# Patient Record
Sex: Female | Born: 1955
Health system: Southern US, Community
[De-identification: ages and names within clinical notes are randomized; demographics above are authoritative.]

## PROBLEM LIST (undated history)

## (undated) ENCOUNTER — Emergency Department (HOSPITAL_COMMUNITY): Payer: Medicare Other

## (undated) DIAGNOSIS — R519 Headache, unspecified: Secondary | ICD-10-CM

## (undated) DIAGNOSIS — K529 Noninfective gastroenteritis and colitis, unspecified: Secondary | ICD-10-CM

## (undated) DIAGNOSIS — M1990 Unspecified osteoarthritis, unspecified site: Secondary | ICD-10-CM

## (undated) DIAGNOSIS — I8290 Acute embolism and thrombosis of unspecified vein: Secondary | ICD-10-CM

## (undated) DIAGNOSIS — F329 Major depressive disorder, single episode, unspecified: Secondary | ICD-10-CM

## (undated) DIAGNOSIS — R011 Cardiac murmur, unspecified: Secondary | ICD-10-CM

## (undated) DIAGNOSIS — Z5189 Encounter for other specified aftercare: Secondary | ICD-10-CM

## (undated) DIAGNOSIS — K766 Portal hypertension: Secondary | ICD-10-CM

## (undated) DIAGNOSIS — I8500 Esophageal varices without bleeding: Secondary | ICD-10-CM

## (undated) DIAGNOSIS — I739 Peripheral vascular disease, unspecified: Secondary | ICD-10-CM

## (undated) DIAGNOSIS — Z87442 Personal history of urinary calculi: Secondary | ICD-10-CM

## (undated) DIAGNOSIS — G2581 Restless legs syndrome: Secondary | ICD-10-CM

## (undated) DIAGNOSIS — F32A Depression, unspecified: Secondary | ICD-10-CM

## (undated) DIAGNOSIS — I10 Essential (primary) hypertension: Secondary | ICD-10-CM

## (undated) DIAGNOSIS — C50919 Malignant neoplasm of unspecified site of unspecified female breast: Secondary | ICD-10-CM

## (undated) DIAGNOSIS — IMO0001 Reserved for inherently not codable concepts without codable children: Secondary | ICD-10-CM

## (undated) DIAGNOSIS — K7460 Unspecified cirrhosis of liver: Secondary | ICD-10-CM

## (undated) DIAGNOSIS — K920 Hematemesis: Secondary | ICD-10-CM

## (undated) DIAGNOSIS — IMO0002 Reserved for concepts with insufficient information to code with codable children: Secondary | ICD-10-CM

## (undated) DIAGNOSIS — D649 Anemia, unspecified: Secondary | ICD-10-CM

## (undated) DIAGNOSIS — R51 Headache: Secondary | ICD-10-CM

## (undated) HISTORY — DX: Noninfective gastroenteritis and colitis, unspecified: K52.9

## (undated) HISTORY — PX: DILATION AND CURETTAGE OF UTERUS: SHX78

## (undated) HISTORY — PX: PORT-A-CATH REMOVAL: SHX5289

## (undated) HISTORY — DX: Reserved for inherently not codable concepts without codable children: IMO0001

## (undated) HISTORY — DX: Reserved for concepts with insufficient information to code with codable children: IMO0002

---

## 2003-01-19 ENCOUNTER — Encounter: Payer: Self-pay | Admitting: Internal Medicine

## 2003-01-19 ENCOUNTER — Encounter: Admission: RE | Admit: 2003-01-19 | Discharge: 2003-01-19 | Payer: Self-pay | Admitting: Internal Medicine

## 2003-08-17 ENCOUNTER — Ambulatory Visit (HOSPITAL_COMMUNITY): Admission: RE | Admit: 2003-08-17 | Discharge: 2003-08-17 | Payer: Self-pay

## 2003-08-17 ENCOUNTER — Encounter (INDEPENDENT_AMBULATORY_CARE_PROVIDER_SITE_OTHER): Payer: Self-pay

## 2009-12-30 HISTORY — PX: PORTACATH PLACEMENT: SHX2246

## 2010-07-17 ENCOUNTER — Encounter: Admission: RE | Admit: 2010-07-17 | Discharge: 2010-07-17 | Payer: Self-pay | Admitting: Family Medicine

## 2010-07-18 ENCOUNTER — Ambulatory Visit: Payer: Self-pay | Admitting: Oncology

## 2010-07-22 ENCOUNTER — Encounter: Admission: RE | Admit: 2010-07-22 | Discharge: 2010-07-22 | Payer: Self-pay | Admitting: Family Medicine

## 2010-07-25 LAB — CBC WITH DIFFERENTIAL/PLATELET
Basophils Absolute: 0 10*3/uL (ref 0.0–0.1)
EOS%: 0.5 % (ref 0.0–7.0)
HCT: 45.3 % (ref 34.8–46.6)
HGB: 15.6 g/dL (ref 11.6–15.9)
MCH: 30.3 pg (ref 25.1–34.0)
NEUT%: 73.3 % (ref 38.4–76.8)
lymph#: 1.5 10*3/uL (ref 0.9–3.3)

## 2010-07-25 LAB — COMPREHENSIVE METABOLIC PANEL
AST: 18 U/L (ref 0–37)
BUN: 10 mg/dL (ref 6–23)
CO2: 25 mEq/L (ref 19–32)
Calcium: 9.3 mg/dL (ref 8.4–10.5)
Chloride: 105 mEq/L (ref 96–112)
Creatinine, Ser: 1.02 mg/dL (ref 0.40–1.20)

## 2010-07-25 LAB — CANCER ANTIGEN 27.29: CA 27.29: 1179 U/mL — ABNORMAL HIGH (ref 0–39)

## 2010-07-26 ENCOUNTER — Ambulatory Visit (HOSPITAL_COMMUNITY): Admission: RE | Admit: 2010-07-26 | Discharge: 2010-07-26 | Payer: Self-pay | Admitting: Oncology

## 2010-07-27 LAB — FOLLICLE STIMULATING HORMONE: FSH: 103 m[IU]/mL

## 2010-07-30 ENCOUNTER — Ambulatory Visit (HOSPITAL_COMMUNITY): Admission: RE | Admit: 2010-07-30 | Discharge: 2010-07-30 | Payer: Self-pay | Admitting: General Surgery

## 2010-08-02 ENCOUNTER — Ambulatory Visit: Payer: Self-pay | Admitting: Cardiology

## 2010-08-02 ENCOUNTER — Ambulatory Visit: Admission: RE | Admit: 2010-08-02 | Discharge: 2010-08-02 | Payer: Self-pay | Admitting: Oncology

## 2010-08-02 ENCOUNTER — Encounter: Payer: Self-pay | Admitting: Oncology

## 2010-08-08 LAB — ESTRADIOL, ULTRA SENS: Estradiol, Ultra Sensitive: 16 pg/mL

## 2010-08-09 ENCOUNTER — Ambulatory Visit: Payer: Self-pay | Admitting: Psychiatry

## 2010-08-09 LAB — COMPREHENSIVE METABOLIC PANEL
BUN: 9 mg/dL (ref 6–23)
CO2: 27 mEq/L (ref 19–32)
Calcium: 8.8 mg/dL (ref 8.4–10.5)
Chloride: 103 mEq/L (ref 96–112)
Creatinine, Ser: 0.94 mg/dL (ref 0.40–1.20)

## 2010-08-09 LAB — CBC WITH DIFFERENTIAL/PLATELET
BASO%: 1.7 % (ref 0.0–2.0)
Eosinophils Absolute: 0 10*3/uL (ref 0.0–0.5)
HCT: 39.4 % (ref 34.8–46.6)
LYMPH%: 83.5 % — ABNORMAL HIGH (ref 14.0–49.7)
MCHC: 34.3 g/dL (ref 31.5–36.0)
MONO#: 0 10*3/uL — ABNORMAL LOW (ref 0.1–0.9)
NEUT%: 12.2 % — ABNORMAL LOW (ref 38.4–76.8)
Platelets: 120 10*3/uL — ABNORMAL LOW (ref 145–400)
WBC: 1.2 10*3/uL — ABNORMAL LOW (ref 3.9–10.3)

## 2010-08-09 LAB — LACTATE DEHYDROGENASE: LDH: 240 U/L (ref 94–250)

## 2010-08-13 LAB — CBC WITH DIFFERENTIAL/PLATELET
Basophils Absolute: 0 10*3/uL (ref 0.0–0.1)
Eosinophils Absolute: 0 10*3/uL (ref 0.0–0.5)
HGB: 13 g/dL (ref 11.6–15.9)
NEUT#: 0.1 10*3/uL — CL (ref 1.5–6.5)
RDW: 12.3 % (ref 11.2–14.5)
WBC: 0.9 10*3/uL — CL (ref 3.9–10.3)
lymph#: 0.8 10*3/uL — ABNORMAL LOW (ref 0.9–3.3)

## 2010-08-13 LAB — LACTATE DEHYDROGENASE: LDH: 220 U/L (ref 94–250)

## 2010-08-16 LAB — CBC WITH DIFFERENTIAL/PLATELET
EOS%: 0 % (ref 0.0–7.0)
Eosinophils Absolute: 0 10*3/uL (ref 0.0–0.5)
HCT: 38.2 % (ref 34.8–46.6)
HGB: 13.3 g/dL (ref 11.6–15.9)
LYMPH%: 65.9 % — ABNORMAL HIGH (ref 14.0–49.7)
MCHC: 34.8 g/dL (ref 31.5–36.0)
MONO%: 20.1 % — ABNORMAL HIGH (ref 0.0–14.0)
RBC: 4.56 10*6/uL (ref 3.70–5.45)
WBC: 1.8 10*3/uL — ABNORMAL LOW (ref 3.9–10.3)
nRBC: 1 % — ABNORMAL HIGH (ref 0–0)

## 2010-08-20 ENCOUNTER — Ambulatory Visit: Payer: Self-pay | Admitting: Oncology

## 2010-08-20 LAB — CBC WITH DIFFERENTIAL/PLATELET
Basophils Absolute: 0.1 10*3/uL (ref 0.0–0.1)
EOS%: 0 % (ref 0.0–7.0)
HCT: 37.8 % (ref 34.8–46.6)
HGB: 13.1 g/dL (ref 11.6–15.9)
MCH: 29.4 pg (ref 25.1–34.0)
MCHC: 34.7 g/dL (ref 31.5–36.0)
MCV: 84.8 fL (ref 79.5–101.0)
MONO%: 19.9 % — ABNORMAL HIGH (ref 0.0–14.0)
NEUT%: 50.7 % (ref 38.4–76.8)

## 2010-08-22 LAB — COMPREHENSIVE METABOLIC PANEL
ALT: 16 U/L (ref 0–35)
AST: 21 U/L (ref 0–37)
Alkaline Phosphatase: 55 U/L (ref 39–117)
Glucose, Bld: 99 mg/dL (ref 70–99)
Sodium: 138 mEq/L (ref 135–145)
Total Bilirubin: 0.3 mg/dL (ref 0.3–1.2)
Total Protein: 6 g/dL (ref 6.0–8.3)

## 2010-08-22 LAB — CBC WITH DIFFERENTIAL/PLATELET
BASO%: 0.4 % (ref 0.0–2.0)
EOS%: 0.1 % (ref 0.0–7.0)
LYMPH%: 27.3 % (ref 14.0–49.7)
MCH: 29.1 pg (ref 25.1–34.0)
MCHC: 33.7 g/dL (ref 31.5–36.0)
MCV: 86.5 fL (ref 79.5–101.0)
MONO%: 14.3 % — ABNORMAL HIGH (ref 0.0–14.0)
Platelets: 201 10*3/uL (ref 145–400)
RBC: 4.43 10*6/uL (ref 3.70–5.45)

## 2010-08-22 LAB — CANCER ANTIGEN 27.29: CA 27.29: 690 U/mL — ABNORMAL HIGH (ref 0–39)

## 2010-08-30 LAB — COMPREHENSIVE METABOLIC PANEL
ALT: 180 U/L — ABNORMAL HIGH (ref 0–35)
AST: 209 U/L — ABNORMAL HIGH (ref 0–37)
Albumin: 3.4 g/dL — ABNORMAL LOW (ref 3.5–5.2)
Alkaline Phosphatase: 129 U/L — ABNORMAL HIGH (ref 39–117)
BUN: 11 mg/dL (ref 6–23)
CO2: 22 mEq/L (ref 19–32)
Calcium: 8.6 mg/dL (ref 8.4–10.5)
Chloride: 98 mEq/L (ref 96–112)
Creatinine, Ser: 1.09 mg/dL (ref 0.40–1.20)
Glucose, Bld: 122 mg/dL — ABNORMAL HIGH (ref 70–99)
Potassium: 3.6 mEq/L (ref 3.5–5.3)
Sodium: 132 mEq/L — ABNORMAL LOW (ref 135–145)
Total Bilirubin: 1 mg/dL (ref 0.3–1.2)
Total Protein: 6.9 g/dL (ref 6.0–8.3)

## 2010-08-30 LAB — CBC WITH DIFFERENTIAL/PLATELET
BASO%: 1.8 % (ref 0.0–2.0)
Basophils Absolute: 0.1 10*3/uL (ref 0.0–0.1)
EOS%: 0 % (ref 0.0–7.0)
Eosinophils Absolute: 0 10*3/uL (ref 0.0–0.5)
HCT: 43.9 % (ref 34.8–46.6)
HGB: 15 g/dL (ref 11.6–15.9)
LYMPH%: 14.2 % (ref 14.0–49.7)
MCH: 28.8 pg (ref 25.1–34.0)
MCHC: 34.2 g/dL (ref 31.5–36.0)
MCV: 84.3 fL (ref 79.5–101.0)
MONO#: 0.3 10*3/uL (ref 0.1–0.9)
MONO%: 5.5 % (ref 0.0–14.0)
NEUT#: 4 10*3/uL (ref 1.5–6.5)
NEUT%: 78.5 % — ABNORMAL HIGH (ref 38.4–76.8)
Platelets: 87 10*3/uL — ABNORMAL LOW (ref 145–400)
RBC: 5.21 10*6/uL (ref 3.70–5.45)
RDW: 13.8 % (ref 11.2–14.5)
WBC: 5.1 10*3/uL (ref 3.9–10.3)
lymph#: 0.7 10*3/uL — ABNORMAL LOW (ref 0.9–3.3)
nRBC: 0 % (ref 0–0)

## 2010-08-30 LAB — LACTATE DEHYDROGENASE: LDH: 434 U/L — ABNORMAL HIGH (ref 94–250)

## 2010-08-31 LAB — CBC WITH DIFFERENTIAL/PLATELET
BASO%: 1.2 % (ref 0.0–2.0)
Basophils Absolute: 0.1 10*3/uL (ref 0.0–0.1)
EOS%: 0 % (ref 0.0–7.0)
Eosinophils Absolute: 0 10*3/uL (ref 0.0–0.5)
HCT: 37.8 % (ref 34.8–46.6)
HGB: 13 g/dL (ref 11.6–15.9)
LYMPH%: 7.1 % — ABNORMAL LOW (ref 14.0–49.7)
MCH: 28.6 pg (ref 25.1–34.0)
MCHC: 34.4 g/dL (ref 31.5–36.0)
MCV: 83.3 fL (ref 79.5–101.0)
MONO#: 0.6 10*3/uL (ref 0.1–0.9)
MONO%: 8.7 % (ref 0.0–14.0)
NEUT#: 6 10*3/uL (ref 1.5–6.5)
NEUT%: 83 % — ABNORMAL HIGH (ref 38.4–76.8)
Platelets: 85 10*3/uL — ABNORMAL LOW (ref 145–400)
RBC: 4.54 10*6/uL (ref 3.70–5.45)
RDW: 13.6 % (ref 11.2–14.5)
WBC: 7.2 10*3/uL (ref 3.9–10.3)
lymph#: 0.5 10*3/uL — ABNORMAL LOW (ref 0.9–3.3)
nRBC: 0 % (ref 0–0)

## 2010-08-31 LAB — COMPREHENSIVE METABOLIC PANEL
ALT: 162 U/L — ABNORMAL HIGH (ref 0–35)
AST: 142 U/L — ABNORMAL HIGH (ref 0–37)
Albumin: 3.2 g/dL — ABNORMAL LOW (ref 3.5–5.2)
Alkaline Phosphatase: 139 U/L — ABNORMAL HIGH (ref 39–117)
BUN: 13 mg/dL (ref 6–23)
CO2: 23 mEq/L (ref 19–32)
Calcium: 8.4 mg/dL (ref 8.4–10.5)
Chloride: 105 mEq/L (ref 96–112)
Creatinine, Ser: 0.83 mg/dL (ref 0.40–1.20)
Glucose, Bld: 138 mg/dL — ABNORMAL HIGH (ref 70–99)
Potassium: 3.7 mEq/L (ref 3.5–5.3)
Sodium: 136 mEq/L (ref 135–145)
Total Bilirubin: 0.8 mg/dL (ref 0.3–1.2)
Total Protein: 6.4 g/dL (ref 6.0–8.3)

## 2010-09-12 ENCOUNTER — Ambulatory Visit (HOSPITAL_COMMUNITY): Admission: RE | Admit: 2010-09-12 | Discharge: 2010-09-12 | Payer: Self-pay | Admitting: Oncology

## 2010-09-19 ENCOUNTER — Ambulatory Visit: Payer: Self-pay | Admitting: Oncology

## 2010-09-20 LAB — CBC WITH DIFFERENTIAL/PLATELET
Basophils Absolute: 0.1 10*3/uL (ref 0.0–0.1)
EOS%: 0.2 % (ref 0.0–7.0)
Eosinophils Absolute: 0 10*3/uL (ref 0.0–0.5)
HGB: 8.4 g/dL — ABNORMAL LOW (ref 11.6–15.9)
MCV: 90.1 fL (ref 79.5–101.0)
MONO%: 13.7 % (ref 0.0–14.0)
NEUT#: 3.7 10*3/uL (ref 1.5–6.5)
RBC: 2.94 10*6/uL — ABNORMAL LOW (ref 3.70–5.45)
RDW: 21.2 % — ABNORMAL HIGH (ref 11.2–14.5)
lymph#: 0.8 10*3/uL — ABNORMAL LOW (ref 0.9–3.3)
nRBC: 3 % — ABNORMAL HIGH (ref 0–0)

## 2010-09-20 LAB — RETICULOCYTES
Immature Retic Fract: 24.2 % — ABNORMAL HIGH (ref 0.00–10.70)
RBC: 2.96 10*6/uL — ABNORMAL LOW (ref 3.70–5.45)
Retic %: 5.37 % — ABNORMAL HIGH (ref 0.50–1.50)

## 2010-09-20 LAB — CHCC SMEAR

## 2010-09-24 ENCOUNTER — Encounter (HOSPITAL_COMMUNITY)
Admission: RE | Admit: 2010-09-24 | Discharge: 2010-12-23 | Payer: Self-pay | Source: Home / Self Care | Attending: Oncology | Admitting: Oncology

## 2010-09-24 LAB — CBC WITH DIFFERENTIAL/PLATELET
BASO%: 0.7 % (ref 0.0–2.0)
Basophils Absolute: 0 10*3/uL (ref 0.0–0.1)
EOS%: 1.9 % (ref 0.0–7.0)
HCT: 28.2 % — ABNORMAL LOW (ref 34.8–46.6)
HGB: 8.7 g/dL — ABNORMAL LOW (ref 11.6–15.9)
MCH: 28.5 pg (ref 25.1–34.0)
MCHC: 30.9 g/dL — ABNORMAL LOW (ref 31.5–36.0)
MCV: 92.5 fL (ref 79.5–101.0)
MONO%: 15.7 % — ABNORMAL HIGH (ref 0.0–14.0)
NEUT%: 68.6 % (ref 38.4–76.8)

## 2010-09-27 LAB — CBC WITH DIFFERENTIAL/PLATELET
BASO%: 0.9 % (ref 0.0–2.0)
EOS%: 4.4 % (ref 0.0–7.0)
HCT: 29.2 % — ABNORMAL LOW (ref 34.8–46.6)
LYMPH%: 18.5 % (ref 14.0–49.7)
MCH: 30.8 pg (ref 25.1–34.0)
MCHC: 34 g/dL (ref 31.5–36.0)
MCV: 90.7 fL (ref 79.5–101.0)
MONO%: 10.3 % (ref 0.0–14.0)
NEUT%: 65.9 % (ref 38.4–76.8)
Platelets: 82 10*3/uL — ABNORMAL LOW (ref 145–400)

## 2010-09-27 LAB — COMPREHENSIVE METABOLIC PANEL
ALT: 26 U/L (ref 0–35)
Alkaline Phosphatase: 194 U/L — ABNORMAL HIGH (ref 39–117)
CO2: 24 mEq/L (ref 19–32)
Creatinine, Ser: 0.97 mg/dL (ref 0.40–1.20)
Total Bilirubin: 1.2 mg/dL (ref 0.3–1.2)

## 2010-10-03 LAB — CBC WITH DIFFERENTIAL/PLATELET
BASO%: 0.7 % (ref 0.0–2.0)
Basophils Absolute: 0 10*3/uL (ref 0.0–0.1)
EOS%: 2.6 % (ref 0.0–7.0)
Eosinophils Absolute: 0.1 10*3/uL (ref 0.0–0.5)
HCT: 34.7 % — ABNORMAL LOW (ref 34.8–46.6)
HGB: 10.9 g/dL — ABNORMAL LOW (ref 11.6–15.9)
LYMPH%: 15.4 % (ref 14.0–49.7)
MCH: 29.1 pg (ref 25.1–34.0)
MCHC: 31.4 g/dL — ABNORMAL LOW (ref 31.5–36.0)
MCV: 92.8 fL (ref 79.5–101.0)
MONO#: 0.6 10*3/uL (ref 0.1–0.9)
MONO%: 12.1 % (ref 0.0–14.0)
NEUT#: 3.2 10*3/uL (ref 1.5–6.5)
NEUT%: 69.2 % (ref 38.4–76.8)
Platelets: 80 10*3/uL — ABNORMAL LOW (ref 145–400)
RBC: 3.74 10*6/uL (ref 3.70–5.45)
RDW: 21.4 % — ABNORMAL HIGH (ref 11.2–14.5)
WBC: 4.6 10*3/uL (ref 3.9–10.3)
lymph#: 0.7 10*3/uL — ABNORMAL LOW (ref 0.9–3.3)
nRBC: 0 % (ref 0–0)

## 2010-10-03 LAB — COMPREHENSIVE METABOLIC PANEL
ALT: 23 U/L (ref 0–35)
AST: 36 U/L (ref 0–37)
Albumin: 3.2 g/dL — ABNORMAL LOW (ref 3.5–5.2)
Alkaline Phosphatase: 209 U/L — ABNORMAL HIGH (ref 39–117)
BUN: 7 mg/dL (ref 6–23)
CO2: 19 mEq/L (ref 19–32)
Calcium: 8.4 mg/dL (ref 8.4–10.5)
Chloride: 101 mEq/L (ref 96–112)
Creatinine, Ser: 0.75 mg/dL (ref 0.40–1.20)
Glucose, Bld: 102 mg/dL — ABNORMAL HIGH (ref 70–99)
Potassium: 3.6 mEq/L (ref 3.5–5.3)
Sodium: 133 mEq/L — ABNORMAL LOW (ref 135–145)
Total Bilirubin: 1 mg/dL (ref 0.3–1.2)
Total Protein: 5.5 g/dL — ABNORMAL LOW (ref 6.0–8.3)

## 2010-10-11 LAB — CBC WITH DIFFERENTIAL/PLATELET
BASO%: 0.7 % (ref 0.0–2.0)
Basophils Absolute: 0 10*3/uL (ref 0.0–0.1)
EOS%: 1.5 % (ref 0.0–7.0)
Eosinophils Absolute: 0.1 10*3/uL (ref 0.0–0.5)
HCT: 30.7 % — ABNORMAL LOW (ref 34.8–46.6)
HGB: 10.3 g/dL — ABNORMAL LOW (ref 11.6–15.9)
LYMPH%: 20.3 % (ref 14.0–49.7)
MCH: 30.8 pg (ref 25.1–34.0)
MCHC: 33.5 g/dL (ref 31.5–36.0)
MCV: 91.9 fL (ref 79.5–101.0)
MONO#: 0.5 10*3/uL (ref 0.1–0.9)
MONO%: 11.7 % (ref 0.0–14.0)
NEUT#: 2.9 10*3/uL (ref 1.5–6.5)
NEUT%: 65.8 % (ref 38.4–76.8)
Platelets: 112 10*3/uL — ABNORMAL LOW (ref 145–400)
RBC: 3.34 10*6/uL — ABNORMAL LOW (ref 3.70–5.45)
RDW: 22 % — ABNORMAL HIGH (ref 11.2–14.5)
WBC: 4.5 10*3/uL (ref 3.9–10.3)
lymph#: 0.9 10*3/uL (ref 0.9–3.3)

## 2010-10-18 LAB — CBC WITH DIFFERENTIAL/PLATELET
BASO%: 0.5 % (ref 0.0–2.0)
Basophils Absolute: 0 10*3/uL (ref 0.0–0.1)
EOS%: 1.9 % (ref 0.0–7.0)
Eosinophils Absolute: 0.1 10*3/uL (ref 0.0–0.5)
HCT: 34.6 % — ABNORMAL LOW (ref 34.8–46.6)
HGB: 10.8 g/dL — ABNORMAL LOW (ref 11.6–15.9)
LYMPH%: 30.5 % (ref 14.0–49.7)
MCH: 29.2 pg (ref 25.1–34.0)
MCHC: 31.2 g/dL — ABNORMAL LOW (ref 31.5–36.0)
MCV: 93.5 fL (ref 79.5–101.0)
MONO#: 0.6 10*3/uL (ref 0.1–0.9)
MONO%: 15.8 % — ABNORMAL HIGH (ref 0.0–14.0)
NEUT#: 1.9 10*3/uL (ref 1.5–6.5)
NEUT%: 51.3 % (ref 38.4–76.8)
Platelets: 93 10*3/uL — ABNORMAL LOW (ref 145–400)
RBC: 3.7 10*6/uL (ref 3.70–5.45)
RDW: 19.9 % — ABNORMAL HIGH (ref 11.2–14.5)
WBC: 3.7 10*3/uL — ABNORMAL LOW (ref 3.9–10.3)
lymph#: 1.1 10*3/uL (ref 0.9–3.3)
nRBC: 0 % (ref 0–0)

## 2010-10-23 ENCOUNTER — Ambulatory Visit: Payer: Self-pay | Admitting: Oncology

## 2010-10-25 LAB — CBC WITH DIFFERENTIAL/PLATELET
BASO%: 1 % (ref 0.0–2.0)
Basophils Absolute: 0 10*3/uL (ref 0.0–0.1)
EOS%: 2.8 % (ref 0.0–7.0)
Eosinophils Absolute: 0.1 10*3/uL (ref 0.0–0.5)
HCT: 36.7 % (ref 34.8–46.6)
HGB: 11.5 g/dL — ABNORMAL LOW (ref 11.6–15.9)
LYMPH%: 38.5 % (ref 14.0–49.7)
MCH: 29.2 pg (ref 25.1–34.0)
MCHC: 31.3 g/dL — ABNORMAL LOW (ref 31.5–36.0)
MCV: 93.1 fL (ref 79.5–101.0)
MONO#: 0.4 10*3/uL (ref 0.1–0.9)
MONO%: 14.6 % — ABNORMAL HIGH (ref 0.0–14.0)
NEUT#: 1.2 10*3/uL — ABNORMAL LOW (ref 1.5–6.5)
NEUT%: 43.1 % (ref 38.4–76.8)
Platelets: 95 10*3/uL — ABNORMAL LOW (ref 145–400)
RBC: 3.94 10*6/uL (ref 3.70–5.45)
RDW: 19 % — ABNORMAL HIGH (ref 11.2–14.5)
WBC: 2.9 10*3/uL — ABNORMAL LOW (ref 3.9–10.3)
lymph#: 1.1 10*3/uL (ref 0.9–3.3)
nRBC: 0 % (ref 0–0)

## 2010-11-01 LAB — CBC WITH DIFFERENTIAL/PLATELET
Eosinophils Absolute: 0.1 10*3/uL (ref 0.0–0.5)
HCT: 35.9 % (ref 34.8–46.6)
LYMPH%: 31.7 % (ref 14.0–49.7)
MCV: 91.7 fL (ref 79.5–101.0)
MONO%: 12.1 % (ref 0.0–14.0)
NEUT#: 1.7 10*3/uL (ref 1.5–6.5)
NEUT%: 51.6 % (ref 38.4–76.8)
Platelets: 112 10*3/uL — ABNORMAL LOW (ref 145–400)
RBC: 3.91 10*6/uL (ref 3.70–5.45)

## 2010-11-08 LAB — CBC WITH DIFFERENTIAL/PLATELET
BASO%: 1 % (ref 0.0–2.0)
EOS%: 3.3 % (ref 0.0–7.0)
MCH: 29.9 pg (ref 25.1–34.0)
MCHC: 32.9 g/dL (ref 31.5–36.0)
MONO#: 0.3 10*3/uL (ref 0.1–0.9)
NEUT%: 51.8 % (ref 38.4–76.8)
RBC: 4.11 10*6/uL (ref 3.70–5.45)
WBC: 2.7 10*3/uL — ABNORMAL LOW (ref 3.9–10.3)
lymph#: 0.9 10*3/uL (ref 0.9–3.3)

## 2010-11-08 LAB — GLUCOSE, RANDOM: Glucose, Bld: 97 mg/dL (ref 70–99)

## 2010-11-08 LAB — LIPID PANEL
Cholesterol: 153 mg/dL (ref 0–200)
HDL: 50 mg/dL (ref >39)
Total CHOL/HDL Ratio: 3.1 Ratio

## 2010-11-26 ENCOUNTER — Ambulatory Visit: Payer: Self-pay | Admitting: Oncology

## 2010-11-28 LAB — CBC WITH DIFFERENTIAL/PLATELET
BASO%: 0.5 % (ref 0.0–2.0)
EOS%: 3 % (ref 0.0–7.0)
HCT: 38.3 % (ref 34.8–46.6)
LYMPH%: 33.9 % (ref 14.0–49.7)
MCH: 29.4 pg (ref 25.1–34.0)
MCHC: 33.1 g/dL (ref 31.5–36.0)
MCV: 88.9 fL (ref 79.5–101.0)
MONO#: 0.5 10*3/uL (ref 0.1–0.9)
MONO%: 11.8 % (ref 0.0–14.0)
NEUT%: 50.8 % (ref 38.4–76.8)
Platelets: 118 10*3/uL — ABNORMAL LOW (ref 145–400)
RBC: 4.31 10*6/uL (ref 3.70–5.45)
WBC: 4.2 10*3/uL (ref 3.9–10.3)

## 2010-12-13 LAB — COMPREHENSIVE METABOLIC PANEL
ALT: 38 U/L — ABNORMAL HIGH (ref 0–35)
AST: 54 U/L — ABNORMAL HIGH (ref 0–37)
Albumin: 3.7 g/dL (ref 3.5–5.2)
Alkaline Phosphatase: 231 U/L — ABNORMAL HIGH (ref 39–117)
Calcium: 9.2 mg/dL (ref 8.4–10.5)
Chloride: 107 mEq/L (ref 96–112)
Creatinine, Ser: 0.76 mg/dL (ref 0.40–1.20)
Potassium: 3.7 mEq/L (ref 3.5–5.3)

## 2010-12-13 LAB — CBC WITH DIFFERENTIAL/PLATELET
BASO%: 0.3 % (ref 0.0–2.0)
EOS%: 2.8 % (ref 0.0–7.0)
MCH: 27.7 pg (ref 25.1–34.0)
MCHC: 32.1 g/dL (ref 31.5–36.0)
RDW: 16.3 % — ABNORMAL HIGH (ref 11.2–14.5)
lymph#: 1 10*3/uL (ref 0.9–3.3)

## 2010-12-30 DIAGNOSIS — IMO0001 Reserved for inherently not codable concepts without codable children: Secondary | ICD-10-CM

## 2010-12-30 DIAGNOSIS — Z5189 Encounter for other specified aftercare: Secondary | ICD-10-CM

## 2010-12-30 HISTORY — DX: Reserved for inherently not codable concepts without codable children: IMO0001

## 2010-12-30 HISTORY — DX: Encounter for other specified aftercare: Z51.89

## 2011-01-07 ENCOUNTER — Ambulatory Visit (HOSPITAL_COMMUNITY)
Admission: RE | Admit: 2011-01-07 | Discharge: 2011-01-07 | Payer: Self-pay | Source: Home / Self Care | Attending: Oncology | Admitting: Oncology

## 2011-01-11 ENCOUNTER — Ambulatory Visit: Payer: Self-pay | Admitting: Oncology

## 2011-01-14 LAB — GLUCOSE, CAPILLARY: Glucose-Capillary: 86 mg/dL (ref 70–99)

## 2011-01-15 LAB — CBC WITH DIFFERENTIAL/PLATELET
BASO%: 0.2 % (ref 0.0–2.0)
Basophils Absolute: 0 10*3/uL (ref 0.0–0.1)
EOS%: 1.9 % (ref 0.0–7.0)
Eosinophils Absolute: 0.1 10*3/uL (ref 0.0–0.5)
HCT: 42.8 % (ref 34.8–46.6)
HGB: 14.1 g/dL (ref 11.6–15.9)
LYMPH%: 31 % (ref 14.0–49.7)
MCH: 28.1 pg (ref 25.1–34.0)
MCHC: 32.9 g/dL (ref 31.5–36.0)
MCV: 85.3 fL (ref 79.5–101.0)
MONO#: 0.4 10*3/uL (ref 0.1–0.9)
MONO%: 10 % (ref 0.0–14.0)
NEUT#: 2.1 10*3/uL (ref 1.5–6.5)
NEUT%: 56.9 % (ref 38.4–76.8)
Platelets: 109 10*3/uL — ABNORMAL LOW (ref 145–400)
RBC: 5.01 10*6/uL (ref 3.70–5.45)
RDW: 18 % — ABNORMAL HIGH (ref 11.2–14.5)
WBC: 3.6 10*3/uL — ABNORMAL LOW (ref 3.9–10.3)
lymph#: 1.1 10*3/uL (ref 0.9–3.3)

## 2011-01-15 LAB — COMPREHENSIVE METABOLIC PANEL
ALT: 42 U/L — ABNORMAL HIGH (ref 0–35)
AST: 47 U/L — ABNORMAL HIGH (ref 0–37)
Albumin: 3.5 g/dL (ref 3.5–5.2)
Alkaline Phosphatase: 197 U/L — ABNORMAL HIGH (ref 39–117)
BUN: 10 mg/dL (ref 6–23)
CO2: 27 mEq/L (ref 19–32)
Calcium: 9.6 mg/dL (ref 8.4–10.5)
Chloride: 107 mEq/L (ref 96–112)
Creatinine, Ser: 0.82 mg/dL (ref 0.40–1.20)
Glucose, Bld: 100 mg/dL — ABNORMAL HIGH (ref 70–99)
Potassium: 3.8 mEq/L (ref 3.5–5.3)
Sodium: 142 mEq/L (ref 135–145)
Total Bilirubin: 0.6 mg/dL (ref 0.3–1.2)
Total Protein: 6.9 g/dL (ref 6.0–8.3)

## 2011-01-15 LAB — LACTATE DEHYDROGENASE: LDH: 191 U/L (ref 94–250)

## 2011-01-15 LAB — CANCER ANTIGEN 27.29: CA 27.29: 92 U/mL — ABNORMAL HIGH (ref 0–39)

## 2011-01-20 ENCOUNTER — Encounter: Payer: Self-pay | Admitting: Oncology

## 2011-01-28 ENCOUNTER — Encounter
Admission: RE | Admit: 2011-01-28 | Discharge: 2011-01-28 | Payer: Self-pay | Source: Home / Self Care | Attending: Oncology | Admitting: Oncology

## 2011-02-27 ENCOUNTER — Other Ambulatory Visit: Payer: Self-pay | Admitting: Oncology

## 2011-02-27 ENCOUNTER — Encounter (HOSPITAL_BASED_OUTPATIENT_CLINIC_OR_DEPARTMENT_OTHER): Payer: Federal, State, Local not specified - PPO | Admitting: Oncology

## 2011-02-27 DIAGNOSIS — C50419 Malignant neoplasm of upper-outer quadrant of unspecified female breast: Secondary | ICD-10-CM

## 2011-02-27 DIAGNOSIS — Z23 Encounter for immunization: Secondary | ICD-10-CM

## 2011-02-27 DIAGNOSIS — D696 Thrombocytopenia, unspecified: Secondary | ICD-10-CM

## 2011-02-27 DIAGNOSIS — D702 Other drug-induced agranulocytosis: Secondary | ICD-10-CM

## 2011-02-27 LAB — CBC WITH DIFFERENTIAL/PLATELET
Basophils Absolute: 0 10*3/uL (ref 0.0–0.1)
HCT: 44 % (ref 34.8–46.6)
HGB: 15.1 g/dL (ref 11.6–15.9)
LYMPH%: 33.8 % (ref 14.0–49.7)
MCH: 29.4 pg (ref 25.1–34.0)
MCHC: 34.2 g/dL (ref 31.5–36.0)
MONO#: 0.3 10*3/uL (ref 0.1–0.9)
NEUT%: 55.5 % (ref 38.4–76.8)
Platelets: 110 10*3/uL — ABNORMAL LOW (ref 145–400)
WBC: 3.9 10*3/uL (ref 3.9–10.3)
lymph#: 1.3 10*3/uL (ref 0.9–3.3)

## 2011-02-28 LAB — COMPREHENSIVE METABOLIC PANEL
BUN: 13 mg/dL (ref 6–23)
CO2: 23 mEq/L (ref 19–32)
Calcium: 9.5 mg/dL (ref 8.4–10.5)
Chloride: 105 mEq/L (ref 96–112)
Creatinine, Ser: 0.96 mg/dL (ref 0.40–1.20)
Total Bilirubin: 0.7 mg/dL (ref 0.3–1.2)

## 2011-02-28 LAB — CANCER ANTIGEN 27.29: CA 27.29: 61 U/mL — ABNORMAL HIGH (ref 0–39)

## 2011-03-04 ENCOUNTER — Encounter (HOSPITAL_BASED_OUTPATIENT_CLINIC_OR_DEPARTMENT_OTHER): Payer: Federal, State, Local not specified - PPO | Admitting: Oncology

## 2011-03-04 DIAGNOSIS — C50419 Malignant neoplasm of upper-outer quadrant of unspecified female breast: Secondary | ICD-10-CM

## 2011-03-04 DIAGNOSIS — Z170 Estrogen receptor positive status [ER+]: Secondary | ICD-10-CM

## 2011-03-14 LAB — CROSSMATCH

## 2011-03-14 LAB — ABO/RH: ABO/RH(D): O POS

## 2011-03-16 LAB — DIFFERENTIAL
Basophils Absolute: 0 10*3/uL (ref 0.0–0.1)
Eosinophils Absolute: 0 10*3/uL (ref 0.0–0.7)
Eosinophils Relative: 0 % (ref 0–5)
Lymphocytes Relative: 23 % (ref 12–46)
Lymphs Abs: 1.7 10*3/uL (ref 0.7–4.0)
Neutrophils Relative %: 71 % (ref 43–77)

## 2011-03-16 LAB — CBC
MCH: 30.5 pg (ref 26.0–34.0)
MCHC: 34.3 g/dL (ref 30.0–36.0)
MCV: 89 fL (ref 78.0–100.0)
Platelets: 221 10*3/uL (ref 150–400)
RDW: 13 % (ref 11.5–15.5)

## 2011-03-16 LAB — SURGICAL PCR SCREEN
MRSA, PCR: NEGATIVE
Staphylococcus aureus: POSITIVE — AB

## 2011-03-16 LAB — COMPREHENSIVE METABOLIC PANEL
AST: 18 U/L (ref 0–37)
CO2: 23 mEq/L (ref 19–32)
Calcium: 9.3 mg/dL (ref 8.4–10.5)
Chloride: 109 mEq/L (ref 96–112)
Creatinine, Ser: 0.94 mg/dL (ref 0.4–1.2)
GFR calc non Af Amer: >60 mL/min (ref >60)
Glucose, Bld: 100 mg/dL — ABNORMAL HIGH (ref 70–99)
Total Bilirubin: 0.6 mg/dL (ref 0.3–1.2)

## 2011-03-18 ENCOUNTER — Encounter: Payer: Federal, State, Local not specified - PPO | Admitting: Oncology

## 2011-04-29 ENCOUNTER — Other Ambulatory Visit: Payer: Self-pay | Admitting: Oncology

## 2011-04-29 ENCOUNTER — Encounter (HOSPITAL_BASED_OUTPATIENT_CLINIC_OR_DEPARTMENT_OTHER): Payer: Federal, State, Local not specified - PPO | Admitting: Oncology

## 2011-04-29 DIAGNOSIS — D702 Other drug-induced agranulocytosis: Secondary | ICD-10-CM

## 2011-04-29 DIAGNOSIS — Z170 Estrogen receptor positive status [ER+]: Secondary | ICD-10-CM

## 2011-04-29 DIAGNOSIS — C50419 Malignant neoplasm of upper-outer quadrant of unspecified female breast: Secondary | ICD-10-CM

## 2011-04-29 DIAGNOSIS — D696 Thrombocytopenia, unspecified: Secondary | ICD-10-CM

## 2011-04-29 LAB — CBC WITH DIFFERENTIAL/PLATELET
Basophils Absolute: 0 10*3/uL (ref 0.0–0.1)
Eosinophils Absolute: 0.1 10*3/uL (ref 0.0–0.5)
HCT: 42.3 % (ref 34.8–46.6)
LYMPH%: 33 % (ref 14.0–49.7)
MCV: 89.3 fL (ref 79.5–101.0)
MONO#: 0.3 10*3/uL (ref 0.1–0.9)
MONO%: 7.4 % (ref 0.0–14.0)
NEUT#: 2.5 10*3/uL (ref 1.5–6.5)
NEUT%: 57.4 % (ref 38.4–76.8)
Platelets: 106 10*3/uL — ABNORMAL LOW (ref 145–400)
WBC: 4.4 10*3/uL (ref 3.9–10.3)

## 2011-04-29 LAB — COMPREHENSIVE METABOLIC PANEL
ALT: 51 U/L — ABNORMAL HIGH (ref 0–35)
AST: 42 U/L — ABNORMAL HIGH (ref 0–37)
Alkaline Phosphatase: 133 U/L — ABNORMAL HIGH (ref 39–117)
BUN: 11 mg/dL (ref 6–23)
Chloride: 107 mEq/L (ref 96–112)
Creatinine, Ser: 0.89 mg/dL (ref 0.40–1.20)
Potassium: 3.9 mEq/L (ref 3.5–5.3)

## 2011-05-08 ENCOUNTER — Encounter (HOSPITAL_BASED_OUTPATIENT_CLINIC_OR_DEPARTMENT_OTHER): Payer: Federal, State, Local not specified - PPO | Admitting: Oncology

## 2011-05-08 ENCOUNTER — Other Ambulatory Visit: Payer: Self-pay | Admitting: Oncology

## 2011-05-08 DIAGNOSIS — C799 Secondary malignant neoplasm of unspecified site: Secondary | ICD-10-CM

## 2011-05-08 DIAGNOSIS — C50419 Malignant neoplasm of upper-outer quadrant of unspecified female breast: Secondary | ICD-10-CM

## 2011-05-08 DIAGNOSIS — Z170 Estrogen receptor positive status [ER+]: Secondary | ICD-10-CM

## 2011-05-08 DIAGNOSIS — C50919 Malignant neoplasm of unspecified site of unspecified female breast: Secondary | ICD-10-CM

## 2011-05-13 ENCOUNTER — Ambulatory Visit (HOSPITAL_COMMUNITY)
Admission: RE | Admit: 2011-05-13 | Discharge: 2011-05-13 | Disposition: A | Discharge status: Home / Self Care | Payer: Federal, State, Local not specified - PPO | Source: Ambulatory Visit | Attending: Oncology | Admitting: Oncology

## 2011-05-13 DIAGNOSIS — Z8673 Personal history of transient ischemic attack (TIA), and cerebral infarction without residual deficits: Secondary | ICD-10-CM | POA: Insufficient documentation

## 2011-05-13 DIAGNOSIS — C50919 Malignant neoplasm of unspecified site of unspecified female breast: Secondary | ICD-10-CM

## 2011-05-13 DIAGNOSIS — C799 Secondary malignant neoplasm of unspecified site: Secondary | ICD-10-CM

## 2011-05-13 DIAGNOSIS — D1802 Hemangioma of intracranial structures: Secondary | ICD-10-CM | POA: Insufficient documentation

## 2011-05-13 MED ORDER — GADOBENATE DIMEGLUMINE 529 MG/ML IV SOLN
12.0000 mL | Freq: Once | INTRAVENOUS | Status: AC | PRN
  Administered 2011-05-13: 12 mL via INTRAVENOUS

## 2011-05-16 ENCOUNTER — Other Ambulatory Visit: Payer: Self-pay | Admitting: Oncology

## 2011-05-16 ENCOUNTER — Encounter (HOSPITAL_COMMUNITY): Payer: Self-pay

## 2011-05-16 ENCOUNTER — Ambulatory Visit (HOSPITAL_COMMUNITY)
Admission: RE | Admit: 2011-05-16 | Discharge: 2011-05-16 | Disposition: A | Discharge status: Home / Self Care | Payer: Federal, State, Local not specified - PPO | Source: Ambulatory Visit | Attending: Oncology | Admitting: Oncology

## 2011-05-16 ENCOUNTER — Encounter (HOSPITAL_COMMUNITY)
Admission: RE | Admit: 2011-05-16 | Discharge: 2011-05-16 | Disposition: A | Discharge status: Home / Self Care | Payer: Federal, State, Local not specified - PPO | Source: Ambulatory Visit | Attending: Oncology | Admitting: Oncology

## 2011-05-16 DIAGNOSIS — C50919 Malignant neoplasm of unspecified site of unspecified female breast: Secondary | ICD-10-CM

## 2011-05-16 DIAGNOSIS — M899 Disorder of bone, unspecified: Secondary | ICD-10-CM | POA: Insufficient documentation

## 2011-05-16 DIAGNOSIS — N200 Calculus of kidney: Secondary | ICD-10-CM | POA: Insufficient documentation

## 2011-05-16 DIAGNOSIS — R599 Enlarged lymph nodes, unspecified: Secondary | ICD-10-CM | POA: Insufficient documentation

## 2011-05-16 DIAGNOSIS — J984 Other disorders of lung: Secondary | ICD-10-CM | POA: Insufficient documentation

## 2011-05-16 DIAGNOSIS — K7689 Other specified diseases of liver: Secondary | ICD-10-CM | POA: Insufficient documentation

## 2011-05-16 DIAGNOSIS — Z9221 Personal history of antineoplastic chemotherapy: Secondary | ICD-10-CM | POA: Insufficient documentation

## 2011-05-16 HISTORY — DX: Malignant neoplasm of unspecified site of unspecified female breast: C50.919

## 2011-05-16 MED ORDER — FLUDEOXYGLUCOSE F - 18 (FDG) INJECTION
15.4000 | Freq: Once | INTRAVENOUS | Status: AC | PRN
  Administered 2011-05-16: 15.4 via INTRAVENOUS

## 2011-05-16 MED ORDER — IOHEXOL 300 MG/ML  SOLN
100.0000 mL | Freq: Once | INTRAMUSCULAR | Status: AC | PRN
  Administered 2011-05-16: 100 mL via INTRAVENOUS

## 2011-05-17 NOTE — Op Note (Signed)
   NAME:  Nicole [name], Nicole [name]                          ACCOUNT NO.:  [id number]   MEDICAL RECORD NO.:  [id number]                   PATIENT TYPE:  AMB   LOCATION:  [facility]                                  FACILITY:  [facility]   PHYSICIAN:  Ronda Fairly. Galen Daft, M.D.              DATE OF BIRTH:  02-01-1956   DATE OF PROCEDURE:  DATE OF DISCHARGE:  08/17/2003                                 OPERATIVE REPORT   PREOPERATIVE DIAGNOSIS:  Menometrorrhagia and pelvic pain.   POSTOPERATIVE DIAGNOSES:  Menometrorrhagia and pelvic pain.   PROCEDURE:  Hysteroscopy with biopsy and curettage.   SURGEON:  Ronda Fairly. Galen Daft, M.D.   ANESTHESIA:  General.   COMPLICATIONS:  None.   ESTIMATED BLOOD LOSS:  Minimal.   PROCEDURE NOTE:  The patient was identified and time out performed prior to  the procedure.  She underwent bladder catherization, Betadine prep sterile  technique and draping.  The cervix was sounded to 8 cm.  The uterus was  normal size by configuration.  The cervix was dilated to accept the  hysteroscope, which was placed up to the fundal cavity.  No specific  abnormality was identified.  The curettage was then carried out without  difficulty.  The polyp forceps did not reveal any evidence of any additional  disease, and the tissue was sent to pathology.  The patient tolerated the  procedure well.  All sponge, needle and instrument counts were correct at  the end of the case, and there were no complications.  There was no  appreciable fluid deficit at the end of the procedure.                                               Ronda Fairly. Galen Daft, M.D.    N[place]T/MEDQ  D:  08/29/2003  T:  08/30/2003  Job:  [id number]

## 2011-06-28 ENCOUNTER — Other Ambulatory Visit: Payer: Self-pay | Admitting: Oncology

## 2011-06-28 ENCOUNTER — Encounter (HOSPITAL_BASED_OUTPATIENT_CLINIC_OR_DEPARTMENT_OTHER): Payer: Federal, State, Local not specified - PPO | Admitting: Oncology

## 2011-06-28 DIAGNOSIS — C50419 Malignant neoplasm of upper-outer quadrant of unspecified female breast: Secondary | ICD-10-CM

## 2011-06-28 DIAGNOSIS — C50919 Malignant neoplasm of unspecified site of unspecified female breast: Secondary | ICD-10-CM

## 2011-06-28 DIAGNOSIS — Z170 Estrogen receptor positive status [ER+]: Secondary | ICD-10-CM

## 2011-06-28 LAB — CBC WITH DIFFERENTIAL/PLATELET
Basophils Absolute: 0 10*3/uL (ref 0.0–0.1)
EOS%: 0.3 % (ref 0.0–7.0)
HCT: 42.1 % (ref 34.8–46.6)
HGB: 14.6 g/dL (ref 11.6–15.9)
LYMPH%: 27 % (ref 14.0–49.7)
MCH: 30.9 pg (ref 25.1–34.0)
MCV: 88.7 fL (ref 79.5–101.0)
MONO%: 7.3 % (ref 0.0–14.0)
NEUT%: 65.1 % (ref 38.4–76.8)
Platelets: 105 10*3/uL — ABNORMAL LOW (ref 145–400)
lymph#: 1.3 10*3/uL (ref 0.9–3.3)

## 2011-06-29 LAB — COMPREHENSIVE METABOLIC PANEL
AST: 28 U/L (ref 0–37)
BUN: 20 mg/dL (ref 6–23)
Calcium: 10.2 mg/dL (ref 8.4–10.5)
Chloride: 105 mEq/L (ref 96–112)
Creatinine, Ser: 0.91 mg/dL (ref 0.50–1.10)
Total Bilirubin: 0.6 mg/dL (ref 0.3–1.2)

## 2011-06-29 LAB — CANCER ANTIGEN 27.29: CA 27.29: 34 U/mL (ref 0–39)

## 2011-08-15 ENCOUNTER — Encounter (HOSPITAL_BASED_OUTPATIENT_CLINIC_OR_DEPARTMENT_OTHER): Payer: Federal, State, Local not specified - PPO | Admitting: Oncology

## 2011-08-15 ENCOUNTER — Other Ambulatory Visit: Payer: Self-pay | Admitting: Oncology

## 2011-08-15 DIAGNOSIS — Z170 Estrogen receptor positive status [ER+]: Secondary | ICD-10-CM

## 2011-08-15 DIAGNOSIS — C50419 Malignant neoplasm of upper-outer quadrant of unspecified female breast: Secondary | ICD-10-CM

## 2011-08-15 LAB — CBC WITH DIFFERENTIAL/PLATELET
BASO%: 0.4 % (ref 0.0–2.0)
EOS%: 0.1 % (ref 0.0–7.0)
Eosinophils Absolute: 0 10*3/uL (ref 0.0–0.5)
HGB: 14.5 g/dL (ref 11.6–15.9)
MONO#: 0.4 10*3/uL (ref 0.1–0.9)
MONO%: 5.6 % (ref 0.0–14.0)
NEUT#: 4.4 10*3/uL (ref 1.5–6.5)
Platelets: 109 10*3/uL — ABNORMAL LOW (ref 145–400)
RBC: 4.66 10*6/uL (ref 3.70–5.45)
RDW: 13.7 % (ref 11.2–14.5)
WBC: 6.4 10*3/uL (ref 3.9–10.3)

## 2011-08-15 LAB — COMPREHENSIVE METABOLIC PANEL
ALT: 39 U/L — ABNORMAL HIGH (ref 0–35)
Alkaline Phosphatase: 89 U/L (ref 39–117)
Sodium: 139 mEq/L (ref 135–145)
Total Bilirubin: 0.5 mg/dL (ref 0.3–1.2)
Total Protein: 7.4 g/dL (ref 6.0–8.3)

## 2011-08-16 LAB — VITAMIN D 25 HYDROXY (VIT D DEFICIENCY, FRACTURES): Vit D, 25-Hydroxy: 52 ng/mL (ref 30–89)

## 2011-08-22 ENCOUNTER — Encounter: Payer: BC Managed Care – PPO | Admitting: Oncology

## 2011-08-22 ENCOUNTER — Other Ambulatory Visit: Payer: Self-pay | Admitting: Oncology

## 2011-08-22 DIAGNOSIS — Z9889 Other specified postprocedural states: Secondary | ICD-10-CM

## 2011-08-22 DIAGNOSIS — Z853 Personal history of malignant neoplasm of breast: Secondary | ICD-10-CM

## 2011-08-23 ENCOUNTER — Other Ambulatory Visit: Payer: Self-pay | Admitting: Oncology

## 2011-08-23 DIAGNOSIS — C50911 Malignant neoplasm of unspecified site of right female breast: Secondary | ICD-10-CM

## 2011-08-28 ENCOUNTER — Ambulatory Visit
Admission: RE | Admit: 2011-08-28 | Discharge: 2011-08-28 | Disposition: A | Discharge status: Home / Self Care | Payer: BC Managed Care – PPO | Source: Ambulatory Visit | Attending: Oncology | Admitting: Oncology

## 2011-08-28 DIAGNOSIS — Z853 Personal history of malignant neoplasm of breast: Secondary | ICD-10-CM

## 2011-09-04 ENCOUNTER — Ambulatory Visit
Admission: RE | Admit: 2011-09-04 | Discharge: 2011-09-04 | Disposition: A | Discharge status: Home / Self Care | Payer: BC Managed Care – PPO | Source: Ambulatory Visit | Attending: Oncology | Admitting: Oncology

## 2011-09-04 DIAGNOSIS — C50911 Malignant neoplasm of unspecified site of right female breast: Secondary | ICD-10-CM

## 2011-09-04 MED ORDER — GADOBENATE DIMEGLUMINE 529 MG/ML IV SOLN
12.0000 mL | Freq: Once | INTRAVENOUS | Status: AC | PRN
  Administered 2011-09-04: 12 mL via INTRAVENOUS

## 2011-09-09 ENCOUNTER — Encounter (INDEPENDENT_AMBULATORY_CARE_PROVIDER_SITE_OTHER): Payer: Self-pay | Admitting: General Surgery

## 2011-09-10 ENCOUNTER — Encounter (INDEPENDENT_AMBULATORY_CARE_PROVIDER_SITE_OTHER): Payer: Self-pay | Admitting: General Surgery

## 2011-09-10 ENCOUNTER — Ambulatory Visit (INDEPENDENT_AMBULATORY_CARE_PROVIDER_SITE_OTHER): Payer: BC Managed Care – PPO | Admitting: General Surgery

## 2011-09-10 DIAGNOSIS — C50919 Malignant neoplasm of unspecified site of unspecified female breast: Secondary | ICD-10-CM

## 2011-09-10 NOTE — Progress Notes (Signed)
Chief Complaint  Patient presents with  . Breast Cancer Long Term Follow Up    HPI Nicole [name] is a 55 y.o. female.   HPI This is a 55 year old female who had a need for left breast cancer diagnosed in July of 2011. She began chemotherapy but then this was stopped due to side effects and she's been on Femara since then. On her initial presentation she had a very large mass with multiple large right axillary nodes and skin thickening overlying the mass. She underwent an MRI eventually showed a 6.4 x 4.4 x 5 cm mass extending to and involving the underlying skin with numerous satellite nodules and several suspicious nodules in the upper center in upper inner quadrant of the right breast as well. There were also nodules in her right pectoralis muscle suspicious for involvement of tumor. There were also multiple bulky and large right and axillary nodes as well as internal mammary nodes, mediastinal adenopathy, and bilateral pulmonary nodules. This biopsy showed an invasive ductal carcinoma the was low grade. This was ER +100%, PR-positive 5% and HER-2/neu is not amplified. She's done very well since then has had no real complaints except for some joint pain on the left resolve. She recently has undergone a reevaluation with an MRI which shows significant decrease in the size of the right breast upper outer quadrant mass as well as the adenopathy in her axilla as well as in her chest and her pulmonary nodules she presents today to discuss surgery for local control as a possibility given the improvement in her metastatic disease.  Past Medical History  Diagnosis Date  . IBD (inflammatory bowel disease)   . Cancer   . Breast ca     breast ca dx 06/2010, stage 4    Past Surgical History  Procedure Date  . Dilation and curettage of uterus 2004?  [name] Portacath placement     History reviewed. No pertinent family history.  Social History History  Substance Use Topics  . Smoking status: Never Smoker    . Smokeless tobacco: Not on file  . Alcohol Use: No    No Known Allergies  Current Outpatient Prescriptions  Medication Sig Dispense Refill  . alendronate (FOSAMAX) 70 MG tablet       . B Complex Vitamins (B COMPLEX 1 PO) Take by mouth.        . Calcium Carbonate (CALTRATE 600 PO) Take by mouth 2 (two) times daily.        [name] CALCIUM PO Take by mouth daily.        . Cholecalciferol (VITAMIN D PO) Take by mouth daily.        [name] letrozole (FEMARA) 2.5 MG tablet       . venlafaxine (EFFEXOR-XR) 37.5 MG 24 hr capsule         Review of Systems Review of Systems  Constitutional: Positive for fatigue.  All other systems reviewed and are negative.    There were no vitals taken for this visit.  Physical Exam Physical Exam  Constitutional: She appears well-developed and well-nourished.  Eyes: No scleral icterus.  Neck: Neck supple.  Cardiovascular: Normal rate, regular rhythm and normal heart sounds.   Pulmonary/Chest: Effort normal and breath sounds normal. She has no wheezes. She has no rales. Right breast exhibits mass and skin change. Right breast exhibits no inverted nipple, no nipple discharge and no tenderness. Left breast exhibits no inverted nipple, no mass, no nipple discharge, no skin change and no tenderness.  Breasts are symmetrical.    Abdominal: Soft.  Lymphadenopathy:    She has no cervical adenopathy.    She has no axillary adenopathy.     Assessment    Stage 4 right breast cancer with good response to endocrine therapy    Plan    She has had a very good response systemically as well as locally with letrozole.  I think it is reasonable to consider local therapy at this point.  Her MRI and her exam have shown significant improvement in the mass.  She however has small breast size.  I think a lumpectomy may be attempted but this will likely give a poor result afterwards with a pretty significant chance of positive margins.  I am also concerned about the other areas  on her initial MRI.  I think a mastectomy would be best plan if we decide to go forward with local control. We will need to discuss radiation therapy as well and possible plastics referral.  Another option would be to continue letrozole.  We are going to discuss her at multidisciplinary conference tomorrow and I will call her following this.       Nicole [name] 09/10/2011, 12:36 PM

## 2011-10-08 ENCOUNTER — Encounter (HOSPITAL_BASED_OUTPATIENT_CLINIC_OR_DEPARTMENT_OTHER): Payer: BC Managed Care – PPO | Admitting: Oncology

## 2011-10-08 DIAGNOSIS — Z170 Estrogen receptor positive status [ER+]: Secondary | ICD-10-CM

## 2011-10-08 DIAGNOSIS — Z452 Encounter for adjustment and management of vascular access device: Secondary | ICD-10-CM

## 2011-10-08 DIAGNOSIS — C50419 Malignant neoplasm of upper-outer quadrant of unspecified female breast: Secondary | ICD-10-CM

## 2011-11-12 ENCOUNTER — Other Ambulatory Visit: Payer: Self-pay | Admitting: Oncology

## 2011-11-15 ENCOUNTER — Ambulatory Visit (HOSPITAL_BASED_OUTPATIENT_CLINIC_OR_DEPARTMENT_OTHER): Payer: BC Managed Care – PPO | Admitting: Oncology

## 2011-11-15 ENCOUNTER — Other Ambulatory Visit: Payer: Self-pay | Admitting: Oncology

## 2011-11-15 ENCOUNTER — Telehealth: Payer: Self-pay | Admitting: *Deleted

## 2011-11-15 ENCOUNTER — Other Ambulatory Visit (HOSPITAL_BASED_OUTPATIENT_CLINIC_OR_DEPARTMENT_OTHER): Payer: BC Managed Care – PPO | Admitting: Lab

## 2011-11-15 VITALS — BP 159/97 | HR 93 | Temp 97.6°F | Ht 64.0 in | Wt 137.0 lb

## 2011-11-15 DIAGNOSIS — C50919 Malignant neoplasm of unspecified site of unspecified female breast: Secondary | ICD-10-CM

## 2011-11-15 DIAGNOSIS — D696 Thrombocytopenia, unspecified: Secondary | ICD-10-CM

## 2011-11-15 DIAGNOSIS — C50419 Malignant neoplasm of upper-outer quadrant of unspecified female breast: Secondary | ICD-10-CM

## 2011-11-15 DIAGNOSIS — C779 Secondary and unspecified malignant neoplasm of lymph node, unspecified: Secondary | ICD-10-CM

## 2011-11-15 LAB — CBC WITH DIFFERENTIAL/PLATELET
Eosinophils Absolute: 0 10*3/uL (ref 0.0–0.5)
MONO#: 0.3 10*3/uL (ref 0.1–0.9)
NEUT#: 2.5 10*3/uL (ref 1.5–6.5)
RBC: 4.64 10*6/uL (ref 3.70–5.45)
RDW: 12.9 % (ref 11.2–14.5)
WBC: 4.3 10*3/uL (ref 3.9–10.3)
lymph#: 1.5 10*3/uL (ref 0.9–3.3)

## 2011-11-15 LAB — COMPREHENSIVE METABOLIC PANEL
ALT: 26 U/L (ref 0–35)
AST: 25 U/L (ref 0–37)
Calcium: 9.5 mg/dL (ref 8.4–10.5)
Chloride: 106 mEq/L (ref 96–112)
Creatinine, Ser: 1.01 mg/dL (ref 0.50–1.10)
Total Bilirubin: 0.7 mg/dL (ref 0.3–1.2)

## 2011-11-15 NOTE — Telephone Encounter (Signed)
GAVE PATIENT APPOINTMENT FOR 2013

## 2011-11-15 NOTE — Progress Notes (Signed)
Hematology and Oncology Follow Up Visit  Nicole [name] [id number] 1956/06/23 55 y.o. 11/15/2011 12:58 PM   Principle Diagnosis: Locally advanced breast cancer with metastases currently on Femara. She was  diagnosed July 2011. Initial trial with tesataxel, complicated by persistent thrombocytopenia.   Interim History:  Nicole [name] is doing well. She had a recent MRI scan in September that showed the mass in the right breast was now 2.3 x 1.2 x 1.8 cm to this was originally measured at 6.4 x 4.4 x 5. The remainder of the scan showed otherwise a good response in the Monday spinal adenopathy and pulmonary nodules. Cavity is pleased with this. She did see Dr. Dwain Sarna a few months ago who is recommended possible surgery at some point next year. She likely need radiation therapy as well.  Medications: I have reviewed the patient's current medications.  Allergies: No Known Allergies  Past Medical History, Surgical history, Social history, and Family History were reviewed and updated.  Review of Systems: Constitutional:  Negative for fever, chills, night sweats, anorexia, weight loss, pain. Cardiovascular: no chest pain or dyspnea on exertion Respiratory: no cough, shortness of breath, or wheezing Neurological: no TIA or stroke symptoms Dermatological: negative ENT: negative Skin Gastrointestinal: no abdominal pain, change in bowel habits, or black or bloody stools Genito-Urinary: no dysuria, trouble voiding, or hematuria Hematological and Lymphatic: negative Breast: negative for breast lumps Musculoskeletal: positive for - joint pain Remaining ROS negative.  Physical Exam: Blood pressure 159/97, pulse 93, temperature 97.6 F (36.4 C), height 5\' 4"  (1.626 m), weight 137 lb (62.143 kg). ECOG: 0 General appearance: alert, cooperative and appears stated age Head: Normocephalic, without obvious abnormality, atraumatic Neck: no adenopathy, no carotid bruit, no JVD, supple, symmetrical, trachea  midline and thyroid not enlarged, symmetric, no tenderness/mass/nodules Lymph nodes: Cervical, supraclavicular, and axillary nodes normal. Cardiac : normal Pulmonary:normal Breasts: Right breast has a palpable mass measuring approximately 2 cm in the upper-outer quadrant. This is not appear to be fixed underlying fascia. There is less significant distortion to the breast itself. The axilla is negative as is the left breast and axilla. Abdomen: Normal Extremities normal  Neuro:  Lab Results: Lab Results  Component Value Date   WBC 4.3 11/15/2011   HGB 14.3 11/15/2011   HCT 41.3 11/15/2011   MCV 89.1 11/15/2011   PLT 112* 11/15/2011     Chemistry      Component Value Date/Time   NA 139 08/15/2011 1536   K 3.5 08/15/2011 1536   CL 102 08/15/2011 1536   CO2 25 08/15/2011 1536   BUN 18 08/15/2011 1536   CREATININE 0.92 08/15/2011 1536      Component Value Date/Time   CALCIUM 10.0 08/15/2011 1536   ALKPHOS 89 08/15/2011 1536   AST 31 08/15/2011 1536   ALT 39* 08/15/2011 1536   BILITOT 0.5 08/15/2011 1536       Radiological Studies: chest X-ray n/a n/a Mammogram n/a Bone density n/a  Impression and Plan: He is doing well. She has seen the  surgeon. Plan is to possibly undergo either lumpectomy or mastectomy in the new year. I am gratified by her good response. She will continue Femara indefinitely. She will likely recheck radiation therapy after surgery regardless of the procedure performed.  More than 50% of the visit was spent in patient-related counselling   Pierce Crane, MD 11/16/201212:58 PM

## 2011-11-16 ENCOUNTER — Other Ambulatory Visit: Payer: Self-pay | Admitting: Physician Assistant

## 2011-12-18 ENCOUNTER — Encounter: Payer: Self-pay | Admitting: *Deleted

## 2011-12-18 ENCOUNTER — Telehealth: Payer: Self-pay | Admitting: *Deleted

## 2011-12-18 NOTE — Telephone Encounter (Signed)
made patient appointment for flushes every six weeks

## 2011-12-20 ENCOUNTER — Other Ambulatory Visit: Payer: Self-pay | Admitting: *Deleted

## 2011-12-20 ENCOUNTER — Ambulatory Visit (HOSPITAL_BASED_OUTPATIENT_CLINIC_OR_DEPARTMENT_OTHER): Payer: BC Managed Care – PPO

## 2011-12-20 DIAGNOSIS — Z469 Encounter for fitting and adjustment of unspecified device: Secondary | ICD-10-CM

## 2011-12-20 DIAGNOSIS — C50919 Malignant neoplasm of unspecified site of unspecified female breast: Secondary | ICD-10-CM

## 2011-12-20 DIAGNOSIS — Z23 Encounter for immunization: Secondary | ICD-10-CM

## 2011-12-20 MED ORDER — SODIUM CHLORIDE 0.9 % IJ SOLN
10.0000 mL | INTRAMUSCULAR | Status: DC | PRN
  Administered 2011-12-20: 10 mL via INTRAVENOUS
  Filled 2011-12-20: qty 10

## 2011-12-20 MED ORDER — INFLUENZA VIRUS VACC SPLIT PF IM SUSP
0.5000 mL | Freq: Once | INTRAMUSCULAR | Status: AC
  Administered 2011-12-20: 0.5 mL via INTRAMUSCULAR
  Filled 2011-12-20: qty 0.5

## 2011-12-20 MED ORDER — HEPARIN SOD (PORK) LOCK FLUSH 100 UNIT/ML IV SOLN
500.0000 [IU] | Freq: Once | INTRAVENOUS | Status: AC
  Administered 2011-12-20: 500 [IU] via INTRAVENOUS
  Filled 2011-12-20: qty 5

## 2011-12-20 NOTE — Patient Instructions (Signed)
Call MD for problems.  Flu vaccine information sheets sent home with patient

## 2011-12-22 ENCOUNTER — Other Ambulatory Visit: Payer: Self-pay | Admitting: Physician Assistant

## 2011-12-22 DIAGNOSIS — C44599 Other specified malignant neoplasm of skin of other part of trunk: Secondary | ICD-10-CM

## 2011-12-22 DIAGNOSIS — C50919 Malignant neoplasm of unspecified site of unspecified female breast: Secondary | ICD-10-CM

## 2012-01-08 ENCOUNTER — Other Ambulatory Visit: Payer: Self-pay | Admitting: Oncology

## 2012-01-08 DIAGNOSIS — C50919 Malignant neoplasm of unspecified site of unspecified female breast: Secondary | ICD-10-CM

## 2012-01-09 ENCOUNTER — Telehealth: Payer: Self-pay | Admitting: Oncology

## 2012-01-09 ENCOUNTER — Other Ambulatory Visit (HOSPITAL_BASED_OUTPATIENT_CLINIC_OR_DEPARTMENT_OTHER): Payer: BC Managed Care – PPO | Admitting: Lab

## 2012-01-09 ENCOUNTER — Ambulatory Visit (HOSPITAL_BASED_OUTPATIENT_CLINIC_OR_DEPARTMENT_OTHER): Payer: BC Managed Care – PPO | Admitting: Oncology

## 2012-01-09 VITALS — BP 160/89 | HR 86 | Temp 98.1°F | Ht 64.0 in | Wt 140.3 lb

## 2012-01-09 DIAGNOSIS — C50419 Malignant neoplasm of upper-outer quadrant of unspecified female breast: Secondary | ICD-10-CM

## 2012-01-09 DIAGNOSIS — C50919 Malignant neoplasm of unspecified site of unspecified female breast: Secondary | ICD-10-CM

## 2012-01-09 LAB — CBC WITH DIFFERENTIAL/PLATELET
Basophils Absolute: 0 10*3/uL (ref 0.0–0.1)
EOS%: 0.1 % (ref 0.0–7.0)
MCHC: 34.2 g/dL (ref 31.5–36.0)
MCV: 89 fL (ref 79.5–101.0)
MONO#: 0.3 10*3/uL (ref 0.1–0.9)
NEUT#: 2.5 10*3/uL (ref 1.5–6.5)
NEUT%: 61.4 % (ref 38.4–76.8)
Platelets: 113 10*3/uL — ABNORMAL LOW (ref 145–400)
WBC: 4.2 10*3/uL (ref 3.9–10.3)
lymph#: 1.3 10*3/uL (ref 0.9–3.3)

## 2012-01-09 LAB — COMPREHENSIVE METABOLIC PANEL
BUN: 15 mg/dL (ref 6–23)
CO2: 25 mEq/L (ref 19–32)
Calcium: 9.4 mg/dL (ref 8.4–10.5)
Chloride: 104 mEq/L (ref 96–112)
Creatinine, Ser: 1 mg/dL (ref 0.50–1.10)

## 2012-01-09 LAB — LACTATE DEHYDROGENASE: LDH: 171 U/L (ref 94–250)

## 2012-01-09 LAB — CANCER ANTIGEN 27.29: CA 27.29: 24 U/mL (ref 0–39)

## 2012-01-09 NOTE — Progress Notes (Signed)
Hematology and Oncology Follow Up Visit  Nicole [name] [id number] 06-02-1956 56 y.o. 01/09/2012 1:28 PM   Principle Diagnosis: Locally advanced breast cancer with metastases currently on Femara. She was  diagnosed July 2011. Initial trial with tesataxel, complicated by persistent thrombocytopenia.   Interim History:  Nicole [name] is doing well. She has had no intercurrent problems. Breast mass is unchanged. No new scans. She sees Dr Dwain Sarna next week.  Medications: I have reviewed the patient's current medications.  Allergies: No Known Allergies  Past Medical History, Surgical history, Social history, and Family History were reviewed and updated.  Review of Systems: Constitutional:  Negative for fever, chills, night sweats, anorexia, weight loss, pain. Cardiovascular: no chest pain or dyspnea on exertion Respiratory: no cough, shortness of breath, or wheezing Neurological: no TIA or stroke symptoms Dermatological: negative ENT: negative Skin Gastrointestinal: no abdominal pain, change in bowel habits, or black or bloody stools Genito-Urinary: no dysuria, trouble voiding, or hematuria Hematological and Lymphatic: negative Breast: rt breast mass-mobile, unchanged. Musculoskeletal: positive for - joint pain Remaining ROS negative.  Physical Exam: Blood pressure 160/89, pulse 86, temperature 98.1 F (36.7 C), height 5\' 4"  (1.626 m), weight 140 lb 4.8 oz (63.64 kg). ECOG: 0 General appearance: alert, cooperative and appears stated age Head: Normocephalic, without obvious abnormality, atraumatic Neck: no adenopathy, no carotid bruit, no JVD, supple, symmetrical, trachea midline and thyroid not enlarged, symmetric, no tenderness/mass/nodules Lymph nodes: Cervical, supraclavicular, and axillary nodes normal. Cardiac : normal Pulmonary:normal Breasts: Right breast has a palpable mass measuring approximately 2 cm in the upper-outer quadrant. This is not appear to be fixed underlying  fascia. There is less significant distortion to the breast itself. The axilla is negative as is the left breast and axilla. Abdomen: Normal Extremities normal  Neuro:nl  Lab Results: Lab Results  Component Value Date   WBC 4.2 01/09/2012   HGB 14.5 01/09/2012   HCT 42.3 01/09/2012   MCV 89.0 01/09/2012   PLT 113* 01/09/2012     Chemistry      Component Value Date/Time   NA 140 11/15/2011 1111   NA 140 11/15/2011 1111   K 4.1 11/15/2011 1111   K 4.1 11/15/2011 1111   CL 106 11/15/2011 1111   CL 106 11/15/2011 1111   CO2 22 11/15/2011 1111   CO2 22 11/15/2011 1111   BUN 13 11/15/2011 1111   BUN 13 11/15/2011 1111   CREATININE 1.01 11/15/2011 1111   CREATININE 1.01 11/15/2011 1111      Component Value Date/Time   CALCIUM 9.5 11/15/2011 1111   CALCIUM 9.5 11/15/2011 1111   ALKPHOS 63 11/15/2011 1111   ALKPHOS 63 11/15/2011 1111   AST 25 11/15/2011 1111   AST 25 11/15/2011 1111   ALT 26 11/15/2011 1111   ALT 26 11/15/2011 1111   BILITOT 0.7 11/15/2011 1111   BILITOT 0.7 11/15/2011 1111       Radiological Studies: chest X-ray n/a n/a Mammogram n/a Bone density n/a  Impression and Plan: SHe is doing well. She has seen the  surgeon. Plan is to possibly undergo either lumpectomy or mastectomy in the new year. I am gratified by her good response. She will continue Femara indefinitely.  We will need to decide re- surgery or ongoing observation.  Plan on f/u PET b4 OV..  More than 50% of the visit was spent in patient-related counselling   Pierce Crane, MD 1/10/20131:28 PM

## 2012-01-09 NOTE — Telephone Encounter (Signed)
Gv pt appt for [id number] 

## 2012-01-14 ENCOUNTER — Encounter (INDEPENDENT_AMBULATORY_CARE_PROVIDER_SITE_OTHER): Payer: Self-pay | Admitting: General Surgery

## 2012-01-14 ENCOUNTER — Ambulatory Visit (INDEPENDENT_AMBULATORY_CARE_PROVIDER_SITE_OTHER): Payer: BC Managed Care – PPO | Admitting: General Surgery

## 2012-01-14 VITALS — BP 152/82 | HR 105 | Temp 98.6°F | Ht 64.0 in | Wt 138.2 lb

## 2012-01-14 DIAGNOSIS — C50919 Malignant neoplasm of unspecified site of unspecified female breast: Secondary | ICD-10-CM

## 2012-01-14 NOTE — Progress Notes (Signed)
Subjective:     Patient ID: Nicole [name], female   DOB: August 31, 1956, 56 y.o.   MRN: [id number]  HPI This is a 54y-year-old female who had stage IV left breast cancer diagnosed in July of 2011. She began chemotherapy but then this was stopped due to side effects and she's been on Femara since then. On her initial presentation she had a very large mass with multiple large right axillary nodes and skin thickening overlying the mass. She underwent an MRI eventually showed a 6.4 x 4.4 x 5 cm mass extending to and involving the underlying skin with numerous satellite nodules and several suspicious nodules in the upper center in upper inner quadrant of the right breast as well. There were also nodules in her right pectoralis muscle suspicious for involvement of tumor. There were also multiple bulky and large right and axillary nodes as well as internal mammary nodes, mediastinal adenopathy, and bilateral pulmonary nodules. This biopsy showed an invasive ductal carcinoma the was low grade. This was ER +100%, PR-positive 5% and HER-2/neu is not amplified. She's done very well since then has had no real complaints except for some joint pain on the left resolve. She recently has undergone a reevaluation with an MRI which shows significant decrease in the size of the right breast upper outer quadrant mass as well as the adenopathy in her axilla as well as in her chest and her pulmonary nodules..  She continues to do well and this mass has continued to decrease.  She comes in today to discuss possible surgery for local control  She desires bilateral mastectomy.   Review of Systems     Objective:   Physical Exam  Pulmonary/Chest: Right breast exhibits mass and skin change. Right breast exhibits no inverted nipple, no nipple discharge and no tenderness. Left breast exhibits no inverted nipple, no mass, no nipple discharge, no skin change and no tenderness.    Lymphadenopathy:    She has no cervical adenopathy.   She has no axillary adenopathy.       Right: No supraclavicular adenopathy present.       Left: No supraclavicular adenopathy present.       Assessment:     Stage IV breast cancer    Plan:     She's had a very good response. I think she is a reasonable candidate to talk about local control at this point. She would very much like to have a bilateral mastectomy if that is the case. I will discuss with Dr. Donnie Coffin and I will plan on giving her a call back. Will discuss an ABG prior to this as well. He is

## 2012-01-31 ENCOUNTER — Telehealth (INDEPENDENT_AMBULATORY_CARE_PROVIDER_SITE_OTHER): Payer: Self-pay

## 2012-01-31 ENCOUNTER — Telehealth (INDEPENDENT_AMBULATORY_CARE_PROVIDER_SITE_OTHER): Payer: Self-pay | Admitting: General Surgery

## 2012-01-31 NOTE — Telephone Encounter (Signed)
Called pt to notify her that Dr Dwain Sarna did respond back to our message and he spoke to Dr Donnie Coffin. I told her that Dr Dwain Sarna wanted her to get her PET scan done first and then he would call her after that with those results. The pt told me she was never told about the PET scan so there is nothing scheduled. I advised pt that I would call Dr Renelda Loma nurse and let her know what it going on. I advised pt that if she didn't hear back by Tuesday to please call me.

## 2012-01-31 NOTE — Telephone Encounter (Signed)
Patient called and asked that Dr. Dwain Sarna or his nurse give her a call.  On her last visit 01/14/12, she was told that he, Dr. Mauri Reading, would discuss her case with Dr Donnie Coffin and would give her a call on what the next step would be, she has not received a call yet, please call.

## 2012-01-31 NOTE — Telephone Encounter (Signed)
LMOM with Dr Renelda Loma nurse to call me on Tuesday after she schedules the PET scan for the pt. LMOM telling her what was said between Dr Donnie Coffin and DR Dwain Sarna.

## 2012-01-31 NOTE — Telephone Encounter (Signed)
Hey Dr Dwain Sarna I forward this message to you from our pt so you can talk to Dr Donnie Coffin and get back in touch with the pt. I will call her to tell her that you are pm off after call today but we will call her next week.

## 2012-02-04 ENCOUNTER — Other Ambulatory Visit: Payer: Self-pay

## 2012-02-12 ENCOUNTER — Encounter (HOSPITAL_COMMUNITY)
Admission: RE | Admit: 2012-02-12 | Discharge: 2012-02-12 | Disposition: A | Discharge status: Home / Self Care | Payer: BC Managed Care – PPO | Source: Ambulatory Visit | Attending: Oncology | Admitting: Oncology

## 2012-02-12 ENCOUNTER — Encounter (HOSPITAL_COMMUNITY): Payer: Self-pay

## 2012-02-12 DIAGNOSIS — C50919 Malignant neoplasm of unspecified site of unspecified female breast: Secondary | ICD-10-CM

## 2012-02-12 DIAGNOSIS — R911 Solitary pulmonary nodule: Secondary | ICD-10-CM | POA: Insufficient documentation

## 2012-02-12 LAB — GLUCOSE, CAPILLARY: Glucose-Capillary: 90 mg/dL (ref 70–99)

## 2012-02-12 MED ORDER — FLUDEOXYGLUCOSE F - 18 (FDG) INJECTION
16.9000 | Freq: Once | INTRAVENOUS | Status: AC | PRN
  Administered 2012-02-12: 16.9 via INTRAVENOUS

## 2012-02-17 ENCOUNTER — Telehealth (INDEPENDENT_AMBULATORY_CARE_PROVIDER_SITE_OTHER): Payer: Self-pay

## 2012-02-17 NOTE — Telephone Encounter (Signed)
Called pt to make her a f/u appt with Dr Dwain Sarna for next week after her case is presented at the breast cancer conference. I advised pt that her PET scan looks good per DR Dwain Sarna and he would speak to Dr Donnie Coffin about the case. The pt understands and will see [place] next week.

## 2012-02-24 ENCOUNTER — Encounter (INDEPENDENT_AMBULATORY_CARE_PROVIDER_SITE_OTHER): Payer: Self-pay | Admitting: General Surgery

## 2012-02-24 ENCOUNTER — Ambulatory Visit (INDEPENDENT_AMBULATORY_CARE_PROVIDER_SITE_OTHER): Payer: BC Managed Care – PPO | Admitting: General Surgery

## 2012-02-24 VITALS — BP 132/86 | HR 92 | Temp 98.4°F | Resp 16 | Ht 64.0 in | Wt 141.4 lb

## 2012-02-24 DIAGNOSIS — C50919 Malignant neoplasm of unspecified site of unspecified female breast: Secondary | ICD-10-CM

## 2012-02-24 NOTE — Progress Notes (Signed)
Subjective:     Patient ID: Nicole [name], female   DOB: [birth year], 56 y.o.   MRN: [id number]  HPI This is a 56 year old female whose history is documented in her last note. Briefly she had stage IV left breast cancer diagnosed in July of 2011. She has been on Femara since then. Both her exam and her MRI shows significant improvement. I saw her in January to discuss possible surgery for local control. I then sent her to get a PET scan first. Her PET scan shows no disease in her neck, 16 mm right axillary node without any activity, smaller left axillary nodes that are not hypermetabolic, no hypermetabolic internal mammary or mediastinal nodes. Her lung window show some nodular pleural thickening as well as some nodules that are not hypermetabolic. Her abdomen pelvis and bones are all appear normal. She otherwise reports no difference today when I saw her in January. She comes back in today to discuss possible role of surgery.  Review of Systems NUCLEAR MEDICINE PET SKULL BASE TO THIGH  Fasting Blood Glucose: 90  Technique: 16.9 mCi F-18 FDG was injected intravenously. CT data  was obtained and used for attenuation correction and anatomic  localization only. (This was not acquired as a diagnostic CT  examination.) Additional exam technical data entered on  technologist worksheet.  Comparison: PET CT scan 05/16/2011  Findings:  Neck: No hypermetabolic nodes in the neck.  Chest: 16 mm right axillary lymph node does not have significant  metabolic activity and is unchanged in size compared to prior.  Smaller left axillary lymph nodes are also not hypermetabolic. No  hypermetabolic internal mammary or mediastinal lymph nodes. Mild  uptake in the right hilum is felt to be physiologic.  Review of the lung windows demonstrates nodular pleural thickening  along the oblique fissure and within the right upper lobe and right  lower lobe which is not changed from prior. These nodules are not    hypermetabolic.  Abdomen / Pelvis:No abnormal hypermetabolic activity within the  solid organs. No evidence of abdominal or pelvic hypermetabolic  nodes. There are shotty periaortic lymph nodes which are unchanged  from prior.  Skeleton:No focal hypermetabolic activity to suggest skeletal  metastasis.  IMPRESSION:  1. No evidence of breast cancer recurrence.  2. Stable pulmonary nodules.  3. Stable axillary lymph nodes.     Objective:   Physical Exam Deferred today as I examined her a month ago and this was discussion    Assessment:     Stage IV breast cancer    Plan:     I reviewed all of her data. She was actually discussed at multidisciplinary breast conference as well. She has had a  very good response to antiestrogen therapy. She comes back in today to discuss local control. I think it is reasonable to discuss local control on the right side. Her disease is really at bay currently and I think that she may have some trouble if we don't address this. Additionally there may even be some benefit for the treatment of her cancer. What I recommended her today was a right simple mastectomy. I would not evaluate or treat her lymph nodes as I do not think this is going to have any benefit. I do think that removing the primary tumor would be a reasonable plan. She is also interested in pursuing a left simple mastectomy for prophylaxis. I told her today that medically I do not think this is a good idea and we'll not  have an effect on her current breast cancer. She is torn as she is concerned that her result will not be optimal as well as she is concerned about having had breast and placed in the future cancer. I assured her that her survival is governed by the cancer that we know was there and I would plan on just treating this area. We discussed also the option of observation. We discussed reconstruction as well as the likelihood of needing radiation in this. She is going to see Dr. Donnie Coffin in a  couple of weeks and it is going to call me back with what she would like to do. We discussed at length the risks and benefits of a mastectomy as well as a postoperative course.

## 2012-03-12 ENCOUNTER — Ambulatory Visit: Payer: BC Managed Care – PPO | Admitting: Oncology

## 2012-03-12 ENCOUNTER — Other Ambulatory Visit: Payer: BC Managed Care – PPO | Admitting: Lab

## 2012-03-18 ENCOUNTER — Other Ambulatory Visit: Payer: Self-pay | Admitting: Physician Assistant

## 2012-03-19 ENCOUNTER — Other Ambulatory Visit: Payer: Self-pay | Admitting: Physician Assistant

## 2012-03-19 ENCOUNTER — Other Ambulatory Visit: Payer: Self-pay | Admitting: Oncology

## 2012-03-19 ENCOUNTER — Other Ambulatory Visit: Payer: Self-pay | Admitting: *Deleted

## 2012-03-19 DIAGNOSIS — C50919 Malignant neoplasm of unspecified site of unspecified female breast: Secondary | ICD-10-CM

## 2012-03-19 MED ORDER — VENLAFAXINE HCL ER 75 MG PO CP24: 75.0000 mg | ORAL_CAPSULE | Freq: Every day | ORAL | Status: DC

## 2012-04-07 ENCOUNTER — Telehealth: Payer: Self-pay | Admitting: *Deleted

## 2012-04-07 NOTE — Telephone Encounter (Signed)
patient called in and requested her appointment to be on 04-16-2012

## 2012-04-16 ENCOUNTER — Ambulatory Visit: Payer: BC Managed Care – PPO | Admitting: Oncology

## 2012-04-16 ENCOUNTER — Other Ambulatory Visit: Payer: BC Managed Care – PPO | Admitting: Lab

## 2012-04-22 ENCOUNTER — Ambulatory Visit (HOSPITAL_BASED_OUTPATIENT_CLINIC_OR_DEPARTMENT_OTHER): Payer: BC Managed Care – PPO | Admitting: Oncology

## 2012-04-22 ENCOUNTER — Other Ambulatory Visit: Payer: BC Managed Care – PPO | Admitting: Lab

## 2012-04-22 ENCOUNTER — Ambulatory Visit: Payer: BC Managed Care – PPO | Admitting: Oncology

## 2012-04-22 VITALS — BP 158/94 | HR 99 | Temp 97.9°F | Ht 64.0 in | Wt 140.0 lb

## 2012-04-22 DIAGNOSIS — Z79811 Long term (current) use of aromatase inhibitors: Secondary | ICD-10-CM

## 2012-04-22 DIAGNOSIS — M810 Age-related osteoporosis without current pathological fracture: Secondary | ICD-10-CM

## 2012-04-22 DIAGNOSIS — C50919 Malignant neoplasm of unspecified site of unspecified female breast: Secondary | ICD-10-CM

## 2012-04-22 LAB — CBC WITH DIFFERENTIAL/PLATELET
Eosinophils Absolute: 0 10*3/uL (ref 0.0–0.5)
HGB: 14.4 g/dL (ref 11.6–15.9)
LYMPH%: 29.8 % (ref 14.0–49.7)
MCHC: 34.2 g/dL (ref 31.5–36.0)
MONO#: 0.3 10*3/uL (ref 0.1–0.9)
MONO%: 6.3 % (ref 0.0–14.0)
NEUT%: 63.3 % (ref 38.4–76.8)

## 2012-04-22 LAB — LACTATE DEHYDROGENASE: LDH: 213 U/L (ref 94–250)

## 2012-04-22 LAB — COMPREHENSIVE METABOLIC PANEL
BUN: 13 mg/dL (ref 6–23)
CO2: 24 mEq/L (ref 19–32)
Calcium: 10.2 mg/dL (ref 8.4–10.5)
Chloride: 104 mEq/L (ref 96–112)
Creatinine, Ser: 1.08 mg/dL (ref 0.50–1.10)

## 2012-04-22 LAB — CANCER ANTIGEN 27.29: CA 27.29: 23 U/mL (ref 0–39)

## 2012-04-22 NOTE — Progress Notes (Signed)
Hematology and Oncology Follow Up Visit  Nicole [name] [id number] 30-Jun-1956 56 y.o. 04/22/2012 5:54 PM   Principle Diagnosis: Locally advanced breast cancer with metastases currently on Femara. She was  diagnosed July 2011. Initial trial with tesataxel, complicated by persistent thrombocytopenia. She has been on Femara for close to 2 years with excellent clinical response. Most recent PET scan performed in February of this year showed minimal if any uptake seen.  Interim History:  Nicole [name] is doing well. She has had no intercurrent problems. Breast mass is unchanged. No new scans. She has seen Dr. Dwain Sarna recently and discuss surgery. She still of the opinion that she would like to have bilateral mastectomies and so there has been a fairly extensive discussions about this. She feels otherwise well and has had no intercurrent problems.  Medications: I have reviewed the patient's current medications.  Allergies: No Known Allergies  Past Medical History, Surgical history, Social history, and Family History were reviewed and updated.  Review of Systems: Constitutional:  Negative for fever, chills, night sweats, anorexia, weight loss, pain. Cardiovascular: no chest pain or dyspnea on exertion Respiratory: no cough, shortness of breath, or wheezing Neurological: no TIA or stroke symptoms Dermatological: negative ENT: negative Skin Gastrointestinal: no abdominal pain, change in bowel habits, or black or bloody stools Genito-Urinary: no dysuria, trouble voiding, or hematuria Hematological and Lymphatic: negative Breast: rt breast mass-mobile, unchanged. Musculoskeletal: positive for - joint pain Remaining ROS negative.  Physical Exam: Blood pressure 158/94, pulse 99, temperature 97.9 F (36.6 C), height 5\' 4"  (1.626 m), weight 140 lb (63.504 kg). ECOG: 0 General appearance: alert, cooperative and appears stated age Head: Normocephalic, without obvious abnormality, atraumatic Neck: no  adenopathy, no carotid bruit, no JVD, supple, symmetrical, trachea midline and thyroid not enlarged, symmetric, no tenderness/mass/nodules Lymph nodes: Cervical, supraclavicular, and axillary nodes normal. Cardiac : normal Pulmonary:normal Breasts: Right breast has a palpable mass measuring approximately 2 cm in the upper-outer quadrant. This is not appear to be fixed underlying fascia. There is less significant distortion to the breast itself. The axilla is negative as is the left breast and axilla. Abdomen: Normal Extremities normal  Neuro:  Lab Results: Lab Results  Component Value Date   WBC 5.1 04/22/2012   HGB 14.4 04/22/2012   HCT 42.1 04/22/2012   MCV 88.5 04/22/2012   PLT 111* 04/22/2012     Chemistry      Component Value Date/Time   NA 140 04/22/2012 1459   K 3.6 04/22/2012 1459   CL 104 04/22/2012 1459   CO2 24 04/22/2012 1459   BUN 13 04/22/2012 1459   CREATININE 1.08 04/22/2012 1459      Component Value Date/Time   CALCIUM 10.2 04/22/2012 1459   ALKPHOS 60 04/22/2012 1459   AST 21 04/22/2012 1459   ALT 27 04/22/2012 1459   BILITOT 0.7 04/22/2012 1459       Radiological Studies: chest X-ray n/a n/a Mammogram n/a Bone density n/a  Impression and Plan: 56 year old woman with history of metastatic breast cancer locally advanced disease in her right breast. She is status post chemotherapy with paclitaxel based regimen followed by Femara for the past 18 months. Her clinical response to Femara has been nothing short of medicine. She continues to have a small focus of disease in her right breast but this is fairly much smaller than it has been. We discussed the option of mastectomy bilateral versus unilateral. I also explained to her the rationale for considering mastectomy. I believe Dr. Dwain Sarna  has broad discussed the possibility of lumpectomy was concerned about the cosmetic and the other side effects from and doing with this. We also discussed the possibility of full  breast radiation in the absence of surgery. My personal bias was that this would not be a good approach. Ultimately we have established that she would likely move ahead with mastectomy followed by radiation to the chest wall. I will plan to see her back in about 2 months time for followup visit as well as imaging studies. She will continue on Femara. He noticed a most recent bone density test done approximately 2 years ago did show osteoporosis and she is on Fosamax as well as extra vitamin D.   45 minutes was spent with this patient half the time and patient-related counseling   Pierce Crane M.D. FR  More than 50% of the visit was spent in patient-related counselling   Pierce Crane, MD 4/24/20135:54 PM

## 2012-04-24 ENCOUNTER — Telehealth: Payer: Self-pay | Admitting: Oncology

## 2012-04-24 ENCOUNTER — Ambulatory Visit (HOSPITAL_BASED_OUTPATIENT_CLINIC_OR_DEPARTMENT_OTHER): Payer: BC Managed Care – PPO

## 2012-04-24 ENCOUNTER — Telehealth (INDEPENDENT_AMBULATORY_CARE_PROVIDER_SITE_OTHER): Payer: Self-pay | Admitting: General Surgery

## 2012-04-24 VITALS — BP 143/93 | HR 79 | Temp 96.8°F

## 2012-04-24 DIAGNOSIS — C50919 Malignant neoplasm of unspecified site of unspecified female breast: Secondary | ICD-10-CM

## 2012-04-24 DIAGNOSIS — C50419 Malignant neoplasm of upper-outer quadrant of unspecified female breast: Secondary | ICD-10-CM

## 2012-04-24 DIAGNOSIS — Z452 Encounter for adjustment and management of vascular access device: Secondary | ICD-10-CM

## 2012-04-24 MED ORDER — HEPARIN SOD (PORK) LOCK FLUSH 100 UNIT/ML IV SOLN
500.0000 [IU] | Freq: Once | INTRAVENOUS | Status: AC
  Administered 2012-04-24: 500 [IU] via INTRAVENOUS
  Filled 2012-04-24: qty 5

## 2012-04-24 MED ORDER — SODIUM CHLORIDE 0.9 % IJ SOLN
10.0000 mL | INTRAMUSCULAR | Status: DC | PRN
  Administered 2012-04-24: 10 mL via INTRAVENOUS
  Filled 2012-04-24: qty 10

## 2012-04-24 NOTE — Telephone Encounter (Signed)
S/w pt today re appts for June 6/24 lb/ct and 6/27 PR. Pt will arrive @ ct @ 2 pm to start water prep.

## 2012-04-24 NOTE — Telephone Encounter (Signed)
Returned pt's call. The pt was checking to see if she needed an appt with Dr Dwain Sarna to discuss her surgery. I did advise pt that she would need to come into the office to talk about her surgery and get it scheduled. The pt understands.

## 2012-05-18 ENCOUNTER — Encounter (INDEPENDENT_AMBULATORY_CARE_PROVIDER_SITE_OTHER): Payer: Self-pay | Admitting: General Surgery

## 2012-05-18 ENCOUNTER — Ambulatory Visit (INDEPENDENT_AMBULATORY_CARE_PROVIDER_SITE_OTHER): Payer: BC Managed Care – PPO | Admitting: General Surgery

## 2012-05-18 VITALS — BP 140/86 | HR 100 | Temp 98.1°F | Resp 16 | Ht 64.0 in | Wt 143.1 lb

## 2012-05-18 DIAGNOSIS — C50919 Malignant neoplasm of unspecified site of unspecified female breast: Secondary | ICD-10-CM

## 2012-05-18 NOTE — Progress Notes (Signed)
Patient ID: Nicole [name], female   DOB: 12/20/1956, 55 y.o.   MRN: [id number]  Chief Complaint  Patient presents with  . Pre-op Exam    HPI Nicole [name] is a 55 y.o. female.   HPI This is a 55 yof who had left breast cancer diagnosed in July of 2011.  On initial presentation had a large bulky mass with multiple enlarged right axillary nodes as well as pectoralis nodules.  She also underwent PET ct that shows multiple bulky right axillary nodes, IM nodes, mediastinal adenopathy and bilateral pulmonary nodules.  This was ER positive at 100, PR at 5 and Her2 neu is not amplified.  She underwent initial trial with tesataxel complicated by side effects and then has been on femara since then.  I have seen her several times in evaluation for possible surgery.  She reports no new complaints today.  She has undergone PET ct recently again with results below.  She has had a remarkable response and she returns today to discuss local therapy.  Past Medical History  Diagnosis Date  . IBD (inflammatory bowel disease)   . Breast CA     breast ca dx 06/2010, stage 4    Past Surgical History  Procedure Date  . Dilation and curettage of uterus 2004?  . Portacath placement     History reviewed. No pertinent family history.  Social History History  Substance Use Topics  . Smoking status: Never Smoker   . Smokeless tobacco: Not on file  . Alcohol Use: No    No Known Allergies  Current Outpatient Prescriptions  Medication Sig Dispense Refill  . alendronate (FOSAMAX) 70 MG tablet TAKE 1 TABLET ONCE A WEEK  12 tablet  2  . Calcium Carbonate (CALTRATE 600 PO) Take by mouth 2 (two) times daily.        . Cholecalciferol (VITAMIN D PO) Take by mouth daily.        . letrozole (FEMARA) 2.5 MG tablet TAKE 1 TABLET BY MOUTH EVERY DAY  30 tablet  3  . venlafaxine (EFFEXOR-XR) 75 MG 24 hr capsule Take 1 capsule (75 mg total) by mouth daily.  30 capsule  6    Review of Systems Review of Systems    Constitutional: Negative for fever, chills and unexpected weight change.  HENT: Negative for hearing loss, congestion, sore throat, trouble swallowing and voice change.   Eyes: Negative for visual disturbance.  Respiratory: Negative for cough and wheezing.   Cardiovascular: Negative for chest pain, palpitations and leg swelling.  Gastrointestinal: Negative for nausea, vomiting, abdominal pain, diarrhea, constipation, blood in stool, abdominal distention and anal bleeding.  Genitourinary: Negative for hematuria, vaginal bleeding and difficulty urinating.  Musculoskeletal: Negative for arthralgias.  Skin: Negative for rash and wound.  Neurological: Negative for seizures, syncope and headaches.  Hematological: Negative for adenopathy. Does not bruise/bleed easily.  Psychiatric/Behavioral: Negative for confusion.    Blood pressure 140/86, pulse 100, temperature 98.1 F (36.7 C), temperature source Temporal, resp. rate 16, height 5' 4" (1.626 m), weight 143 lb 2 oz (64.921 kg).  Physical Exam Physical Exam  Vitals reviewed. Constitutional: She appears well-developed and well-nourished.  Neck: Neck supple.  Cardiovascular: Normal rate, regular rhythm and normal heart sounds.   Pulmonary/Chest: Effort normal and breath sounds normal. She has no wheezes. She has no rales. Right breast exhibits mass and skin change. Right breast exhibits no inverted nipple, no nipple discharge and no tenderness. Left breast exhibits no inverted nipple, no mass,   no nipple discharge, no skin change and no tenderness. Breasts are symmetrical.    Lymphadenopathy:    She has no cervical adenopathy.    Data Reviewed NUCLEAR MEDICINE PET SKULL BASE TO THIGH  Fasting Blood Glucose: 90  Technique: 16.9 mCi F-18 FDG was injected intravenously. CT data  was obtained and used for attenuation correction and anatomic  localization only. (This was not acquired as a diagnostic CT  examination.) Additional exam technical  data entered on  technologist worksheet.  Comparison: PET CT scan 05/16/2011  Findings:  Neck: No hypermetabolic nodes in the neck.  Chest: 16 mm right axillary lymph node does not have significant  metabolic activity and is unchanged in size compared to prior.  Smaller left axillary lymph nodes are also not hypermetabolic. No  hypermetabolic internal mammary or mediastinal lymph nodes. Mild  uptake in the right hilum is felt to be physiologic.  Review of the lung windows demonstrates nodular pleural thickening  along the oblique fissure and within the right upper lobe and right  lower lobe which is not changed from prior. These nodules are not  hypermetabolic.  Abdomen / Pelvis:No abnormal hypermetabolic activity within the  solid organs. No evidence of abdominal or pelvic hypermetabolic  nodes. There are shotty periaortic lymph nodes which are unchanged  from prior.  Skeleton:No focal hypermetabolic activity to suggest skeletal  metastasis.  IMPRESSION:  1. No evidence of breast cancer recurrence.  2. Stable pulmonary nodules.  3. Stable axillary lymph nodes.   BILATERAL BREAST MRI WITH AND WITHOUT CONTRAST  Technique: Multiplanar, multisequence MR images of both breasts  were obtained prior to and following the intravenous administration  of 15ml of Multihance. Three dimensional images were evaluated at  the independent DynaCad workstation.  Comparison: Mammograms dated 08/28/2011 and 07/17/2010.  Comparison is also made to an MRI dated 07/22/2010.  Findings: There is moderate background parenchymal enhancement  pattern. There has been significant interval reduction in the size  of the mass in the upper outer quadrant of the right breast. It  currently measures 2.3 x 1.2 x 1.8 cm. On the prior MRI dated  07/22/2010 it measured 6.4 x 4.4 x 5.0 cm. There is a central  biopsy clip artifact. There are several small satellite nodules  that have also significantly decreased in  size when compared to the  prior exam.  The pectoralis muscle is uplifted and there is retraction and  thickening of the skin.  The enhancing lesions in the pectoralis muscle have resolved but  there is still mild diffuse increased enhancement in the right  pectoralis muscle compared to the left. There has been significant  interval reduction in the enhancing masses in the right axilla  consistent with decreased size in adenopathy.  Previously described mediastinal adenopathy and pulmonary nodules  are not as apparent on today's study.  No abnormal enhancement in the left breast. There is no enlarged  left axillary adenopathy.  The 7 x 9 mm nodule in the anterior lower lobe of the right liver  that is bright on the T2-weighted images is stable.  IMPRESSION:  Significant interval improvement in the size of the mass in the  upper outer quadrant of the right breast as well as in the right  axillary adenopathy, pulmonary nodules and mediastinal adenopathy.  This is consistent with a good response to neoadjuvant  chemotherapy.  Assessment    Stage IV breast cancer    Plan    She returns to discuss surgical therapy   of her stage IV breast cancer. She is a very good response to systemic treatment at this point. I think it is reasonable to consider doing local therapy. On her MRI and her exam it shows significant improvement in the mass but this is also resulted in shrinking that breast. I don't really think a lumpectomy is possible still due to the size of the remaining lesion as well as the defect it is going to create. I think the best option at this point according to consider surgery is to do a mastectomy. I do think there is some role for mastectomy with her disease given her improvement with systemic treatment. I told her that I would not plan on removing anything but any enlarged remaining lymph nodes I felt in her axilla. I do not identify any abnormal nodes at this point. I think her  survival is based primarily on her metastases obviously and there may be some benefit at this point of local control with the tumor in the breast. We discussed a simple mastectomy. The risks include but are not limited to bleeding, infection, wound dehiscence, and return to the operating room. We discussed her postoperative recovery. We discussed that plastic surgery may be an option in the long term depending on her disease and depending on if she gets radiation therapy which certainly may be indicated in her. Again I think this is reasonable given her remarkable response to systemic therapy at this point. We discussed also the long-term risks of mastectomy including chronic pain. She would like to have this done fairly soon and I will plan on getting her to the operating room in the near future.       Charlina Dwight 05/18/2012, 12:39 PM    

## 2012-05-18 NOTE — Patient Instructions (Signed)
[facility] °[phone] ° °MASTECTOMY: POST OP INSTRUCTIONS ° °Always review your discharge instruction sheet given to you by the facility where your surgery was performed. °IF YOU HAVE DISABILITY OR FAMILY LEAVE FORMS, YOU MUST BRING THEM TO THE OFFICE FOR PROCESSING.   °DO NOT GIVE THEM TO YOUR DOCTOR. °A prescription for pain medication may be given to you upon discharge.  Take your pain medication as prescribed, if needed.  If narcotic pain medicine is not needed, then you may take acetaminophen (Tylenol), naprosyn (Alleve) or ibuprofen (Advil) as needed. °1. Take your usually prescribed medications unless otherwise directed. °2. If you need a refill on your pain medication, please contact your pharmacy.  They will contact our office to request authorization.  Prescriptions will not be filled after 5pm or on week-ends. °3. You should follow a light diet the first few days after arrival home, such as soup and crackers, etc.  Resume your normal diet the day after surgery. °4. Most patients will experience some swelling and bruising on the chest and underarm.  Ice packs will help.  Swelling and bruising can take several days to resolve. Wear the binder day and night until you return to the office.  °5. It is common to experience some constipation if taking pain medication after surgery.  Increasing fluid intake and taking a stool softener (such as Colace) will usually help or prevent this problem from occurring.  A mild laxative (Milk of Magnesia or Miralax) should be taken according to package instructions if there are no bowel movements after 48 hours. °6. Unless discharge instructions indicate otherwise, leave your bandage dry and in place until your next appointment in 3-5 days.  You may take a limited sponge bath.  No tube baths or showers until the drains are removed.  You may have steri-strips (small skin tapes) in place directly over the incision.  These strips should be left on the  skin for 7-10 days. If you have glue it will come off in next couple week.  Any sutures will be removed at an office visit °7. DRAINS:  If you have drains in place, it is important to keep a list of the amount of drainage produced each day in your drains.  Before leaving the hospital, you should be instructed on drain care.  Call our office if you have any questions about your drains. I will remove your drains when they put out less than 30 cc or ml for 2 consecutive days. °8. ACTIVITIES:  You may resume regular (light) daily activities beginning the next day--such as daily self-care, walking, climbing stairs--gradually increasing activities as tolerated.  You may have sexual intercourse when it is comfortable.  Refrain from any heavy lifting or straining until approved by your doctor. °a. You may drive when you are no longer taking prescription pain medication, you can comfortably wear a seatbelt, and you can safely maneuver your car and apply brakes. °b. RETURN TO WORK:  __________________________________________________________ °9. You should see your doctor in the office for a follow-up appointment approximately 3-5 days after your surgery.  Your doctor’s nurse will typically make your follow-up appointment when she calls you with your pathology report.  Expect your pathology report 3-4business days after surgery. °10. OTHER INSTRUCTIONS: ______________________________________________________________________________________________ ____________________________________________________________________________________________ °WHEN TO CALL YOUR DR Goebel Hellums: °1. Fever over 101.0 °2. Nausea and/or vomiting °3. Extreme swelling or bruising °4. Continued bleeding from incision. °5. Increased pain, redness, or drainage from the incision. °The clinic staff is available   to answer your questions during regular business hours.  Please don’t hesitate to call and ask to speak to one of the nurses for clinical concerns.  If  you have a medical emergency, go to the nearest emergency room or call 911.  A surgeon from [facility] is always on call at the hospital. °[address], [address], [place], [place]  [address] ? [address] [address], [place], [place]   [address] °[phone] ? [phone] ? FAX [phone] °Web site: [email or web address] ° °

## 2012-05-22 ENCOUNTER — Encounter (HOSPITAL_COMMUNITY): Payer: Self-pay | Admitting: Pharmacy Technician

## 2012-05-28 ENCOUNTER — Encounter (HOSPITAL_COMMUNITY)
Admission: RE | Admit: 2012-05-28 | Discharge: 2012-05-28 | Disposition: A | Discharge status: Home / Self Care | Payer: BC Managed Care – PPO | Source: Ambulatory Visit | Attending: General Surgery | Admitting: General Surgery

## 2012-05-28 ENCOUNTER — Encounter (HOSPITAL_COMMUNITY): Payer: Self-pay

## 2012-05-28 HISTORY — DX: Cardiac murmur, unspecified: R01.1

## 2012-05-28 HISTORY — DX: Encounter for other specified aftercare: Z51.89

## 2012-05-28 LAB — SURGICAL PCR SCREEN
MRSA, PCR: POSITIVE — AB
Staphylococcus aureus: POSITIVE — AB

## 2012-05-28 LAB — CBC
Hemoglobin: 14.1 g/dL (ref 12.0–15.0)
MCHC: 34 g/dL (ref 30.0–36.0)
RBC: 4.72 MIL/uL (ref 3.87–5.11)
WBC: 4.8 10*3/uL (ref 4.0–10.5)

## 2012-05-28 LAB — BASIC METABOLIC PANEL
GFR calc Af Amer: 72 mL/min — ABNORMAL LOW (ref >90)
GFR calc non Af Amer: 62 mL/min — ABNORMAL LOW (ref >90)
Potassium: 4 mEq/L (ref 3.5–5.1)
Sodium: 139 mEq/L (ref 135–145)

## 2012-05-28 NOTE — Patient Instructions (Signed)
20 Nicole [name]  05/28/2012   Your procedure is scheduled on:  06-02-2012  Report to [facility] at 1100  AM.  Call this number if you have problems the morning of surgery: [phone]   Remember:   Do not eat food ::After Midnight.  .clear liquids midnight until 0720am, then nothing by mouth.  Take these medicines the morning of surgery with A SIP OF WATER:    Do not wear jewelry or make up.  Do not wear lotions, powders, or perfumes.Do not wear deodorant.    Do not bring valuables to the hospital.  Contacts, dentures or bridgework may not be worn into surgery.  Leave suitcase in the car. After surgery it may be brought to your room.  For patients admitted to the hospital, checkout time is 11:00 AM the day of discharge.     Special Instructions: CHG Shower Use Special Wash: 1/2 bottle night before surgery and 1/2 bottle morning of surgery.neck down avoid private area, no shaving for 2 days before showers.   Please read over the following fact sheets that you were given: MRSA Information  Cain Sieve [facility] pre op nurse phone number [phone], call if needed

## 2012-05-28 NOTE — Pre-Procedure Instructions (Signed)
Note sent to dr wakefield in basket to check cbc result platlets =[id number].

## 2012-05-29 NOTE — Pre-Procedure Instructions (Signed)
Spoke with bernie morris dr wakefield aware of platlet count

## 2012-06-02 ENCOUNTER — Encounter (HOSPITAL_COMMUNITY): Payer: Self-pay | Admitting: *Deleted

## 2012-06-02 ENCOUNTER — Encounter (HOSPITAL_COMMUNITY)
Admission: RE | Disposition: A | Discharge status: Home / Self Care | Payer: Self-pay | Source: Ambulatory Visit | Attending: General Surgery

## 2012-06-02 ENCOUNTER — Ambulatory Visit (HOSPITAL_BASED_OUTPATIENT_CLINIC_OR_DEPARTMENT_OTHER): Admission: RE | Admit: 2012-06-02 | Payer: BC Managed Care – PPO | Source: Ambulatory Visit | Admitting: General Surgery

## 2012-06-02 ENCOUNTER — Observation Stay (HOSPITAL_COMMUNITY)
Admission: RE | Admit: 2012-06-02 | Discharge: 2012-06-03 | Disposition: A | Discharge status: Home / Self Care | Payer: BC Managed Care – PPO | Source: Ambulatory Visit | Attending: General Surgery | Admitting: General Surgery

## 2012-06-02 ENCOUNTER — Ambulatory Visit (HOSPITAL_COMMUNITY): Payer: BC Managed Care – PPO | Admitting: Anesthesiology

## 2012-06-02 ENCOUNTER — Encounter (HOSPITAL_COMMUNITY): Payer: Self-pay | Admitting: Anesthesiology

## 2012-06-02 DIAGNOSIS — C773 Secondary and unspecified malignant neoplasm of axilla and upper limb lymph nodes: Secondary | ICD-10-CM | POA: Insufficient documentation

## 2012-06-02 DIAGNOSIS — Z01812 Encounter for preprocedural laboratory examination: Secondary | ICD-10-CM | POA: Insufficient documentation

## 2012-06-02 DIAGNOSIS — C50919 Malignant neoplasm of unspecified site of unspecified female breast: Principal | ICD-10-CM | POA: Insufficient documentation

## 2012-06-02 DIAGNOSIS — Z79899 Other long term (current) drug therapy: Secondary | ICD-10-CM | POA: Insufficient documentation

## 2012-06-02 DIAGNOSIS — Z170 Estrogen receptor positive status [ER+]: Secondary | ICD-10-CM | POA: Insufficient documentation

## 2012-06-02 HISTORY — PX: MASTECTOMY: SHX3

## 2012-06-02 SURGERY — SIMPLE MASTECTOMY
Anesthesia: General | Laterality: Right

## 2012-06-02 SURGERY — SIMPLE MASTECTOMY
Anesthesia: General | Site: Breast | Laterality: Right | Wound class: Clean

## 2012-06-02 MED ORDER — ACETAMINOPHEN 650 MG RE SUPP: 650.0000 mg | Freq: Four times a day (QID) | RECTAL | Status: DC | PRN

## 2012-06-02 MED ORDER — CEFAZOLIN SODIUM 1-5 GM-% IV SOLN
INTRAVENOUS | Status: AC
  Filled 2012-06-02: qty 50

## 2012-06-02 MED ORDER — 0.9 % SODIUM CHLORIDE (POUR BTL) OPTIME
TOPICAL | Status: DC | PRN
  Administered 2012-06-02: 1000 mL

## 2012-06-02 MED ORDER — ACETAMINOPHEN 325 MG PO TABS: 650.0000 mg | ORAL_TABLET | Freq: Four times a day (QID) | ORAL | Status: DC | PRN

## 2012-06-02 MED ORDER — HYDROMORPHONE HCL PF 1 MG/ML IJ SOLN
INTRAMUSCULAR | Status: AC
  Filled 2012-06-02: qty 1

## 2012-06-02 MED ORDER — PROPOFOL 10 MG/ML IV BOLUS
INTRAVENOUS | Status: DC | PRN
  Administered 2012-06-02: 200 mg via INTRAVENOUS

## 2012-06-02 MED ORDER — BACITRACIN ZINC 500 UNIT/GM EX OINT
TOPICAL_OINTMENT | CUTANEOUS | Status: DC | PRN
  Administered 2012-06-02: 1 via TOPICAL

## 2012-06-02 MED ORDER — ONDANSETRON HCL 4 MG/2ML IJ SOLN
INTRAMUSCULAR | Status: DC | PRN
  Administered 2012-06-02: 4 mg via INTRAVENOUS

## 2012-06-02 MED ORDER — LETROZOLE 2.5 MG PO TABS
2.5000 mg | ORAL_TABLET | Freq: Every day | ORAL | Status: DC
  Administered 2012-06-02 – 2012-06-03 (×2): 2.5 mg via ORAL
  Filled 2012-06-02 (×2): qty 1

## 2012-06-02 MED ORDER — HYDROMORPHONE HCL PF 1 MG/ML IJ SOLN
0.2500 mg | INTRAMUSCULAR | Status: DC | PRN
  Administered 2012-06-02 (×3): 0.5 mg via INTRAVENOUS

## 2012-06-02 MED ORDER — PROMETHAZINE HCL 25 MG/ML IJ SOLN: 6.2500 mg | INTRAMUSCULAR | Status: DC | PRN

## 2012-06-02 MED ORDER — VENLAFAXINE HCL ER 75 MG PO CP24
75.0000 mg | ORAL_CAPSULE | Freq: Every evening | ORAL | Status: DC
  Administered 2012-06-02: 75 mg via ORAL
  Filled 2012-06-02 (×2): qty 1

## 2012-06-02 MED ORDER — LIDOCAINE HCL (CARDIAC) 20 MG/ML IV SOLN
INTRAVENOUS | Status: DC | PRN
  Administered 2012-06-02: 50 mg via INTRAVENOUS

## 2012-06-02 MED ORDER — CEFAZOLIN SODIUM 1-5 GM-% IV SOLN
1.0000 g | INTRAVENOUS | Status: AC
  Administered 2012-06-02: 1 g via INTRAVENOUS

## 2012-06-02 MED ORDER — DEXAMETHASONE SODIUM PHOSPHATE 10 MG/ML IJ SOLN
INTRAMUSCULAR | Status: DC | PRN
  Administered 2012-06-02: 10 mg via INTRAVENOUS

## 2012-06-02 MED ORDER — SODIUM CHLORIDE 0.9 % IV SOLN
INTRAVENOUS | Status: DC
  Administered 2012-06-02 – 2012-06-03 (×2): via INTRAVENOUS

## 2012-06-02 MED ORDER — MORPHINE SULFATE 2 MG/ML IJ SOLN
2.0000 mg | INTRAMUSCULAR | Status: DC | PRN
  Administered 2012-06-02 (×2): 2 mg via INTRAVENOUS
  Filled 2012-06-02 (×2): qty 1

## 2012-06-02 MED ORDER — MIDAZOLAM HCL 5 MG/5ML IJ SOLN
INTRAMUSCULAR | Status: DC | PRN
  Administered 2012-06-02: 2 mg via INTRAVENOUS

## 2012-06-02 MED ORDER — OXYCODONE HCL 5 MG PO TABS
5.0000 mg | ORAL_TABLET | ORAL | Status: DC | PRN
  Administered 2012-06-03: 5 mg via ORAL
  Filled 2012-06-02: qty 1

## 2012-06-02 MED ORDER — ACETAMINOPHEN 10 MG/ML IV SOLN
INTRAVENOUS | Status: DC | PRN
  Administered 2012-06-02: 1000 mg via INTRAVENOUS

## 2012-06-02 MED ORDER — BACITRACIN ZINC 500 UNIT/GM EX OINT
TOPICAL_OINTMENT | CUTANEOUS | Status: AC
  Filled 2012-06-02: qty 15

## 2012-06-02 MED ORDER — LACTATED RINGERS IV SOLN
INTRAVENOUS | Status: DC
  Administered 2012-06-02: 1000 mL via INTRAVENOUS

## 2012-06-02 MED ORDER — ONDANSETRON HCL 4 MG/2ML IJ SOLN: 4.0000 mg | Freq: Four times a day (QID) | INTRAMUSCULAR | Status: DC | PRN

## 2012-06-02 MED ORDER — ACETAMINOPHEN 10 MG/ML IV SOLN
INTRAVENOUS | Status: AC
  Filled 2012-06-02: qty 100

## 2012-06-02 MED ORDER — FENTANYL CITRATE 0.05 MG/ML IJ SOLN
INTRAMUSCULAR | Status: DC | PRN
  Administered 2012-06-02 (×3): 50 ug via INTRAVENOUS
  Administered 2012-06-02: 100 ug via INTRAVENOUS

## 2012-06-02 SURGICAL SUPPLY — 47 items
APPLIER CLIP 11 MED OPEN (CLIP) ×2
BENZOIN TINCTURE PRP APPL 2/3 (GAUZE/BANDAGES/DRESSINGS) IMPLANT
BINDER BREAST LRG (GAUZE/BANDAGES/DRESSINGS) ×2 IMPLANT
BINDER BREAST XLRG (GAUZE/BANDAGES/DRESSINGS) IMPLANT
BLADE HEX COATED 2.75 (ELECTRODE) ×2 IMPLANT
CANISTER SUCTION 2500CC (MISCELLANEOUS) ×2 IMPLANT
CHLORAPREP W/TINT 26ML (MISCELLANEOUS) ×2 IMPLANT
CLIP APPLIE 11 MED OPEN (CLIP) ×1 IMPLANT
CLOTH BEACON ORANGE TIMEOUT ST (SAFETY) ×2 IMPLANT
COVER MAYO STAND STRL (DRAPES) IMPLANT
COVER SURGICAL LIGHT HANDLE (MISCELLANEOUS) IMPLANT
DERMABOND ADVANCED (GAUZE/BANDAGES/DRESSINGS) ×2
DERMABOND ADVANCED .7 DNX12 (GAUZE/BANDAGES/DRESSINGS) ×2 IMPLANT
DRAIN CHANNEL 19F RND (DRAIN) ×4 IMPLANT
DRAIN CHANNEL RND F F (WOUND CARE) IMPLANT
DRAPE LAPAROSCOPIC ABDOMINAL (DRAPES) IMPLANT
DRAPE LG THREE QUARTER DISP (DRAPES) ×2 IMPLANT
DRAPE ORTHO SPLIT 77X108 STRL (DRAPES)
DRAPE SURG ORHT 6 SPLT 77X108 (DRAPES) IMPLANT
DRSG PAD ABDOMINAL 8X10 ST (GAUZE/BANDAGES/DRESSINGS) ×2 IMPLANT
ELECT REM PT RETURN 9FT ADLT (ELECTROSURGICAL) ×2
ELECTRODE REM PT RTRN 9FT ADLT (ELECTROSURGICAL) ×1 IMPLANT
EVACUATOR SILICONE 100CC (DRAIN) ×4 IMPLANT
GLOVE BIO SURGEON STRL SZ7 (GLOVE) ×2 IMPLANT
GLOVE BIOGEL PI IND STRL 7.5 (GLOVE) ×1 IMPLANT
GLOVE BIOGEL PI INDICATOR 7.5 (GLOVE) ×1
GOWN PREVENTION PLUS LG XLONG (DISPOSABLE) ×2 IMPLANT
GOWN PREVENTION PLUS XLARGE (GOWN DISPOSABLE) ×2 IMPLANT
GOWN STRL NON-REIN LRG LVL3 (GOWN DISPOSABLE) ×2 IMPLANT
GOWN STRL REIN XL XLG (GOWN DISPOSABLE) ×2 IMPLANT
KIT BASIN OR (CUSTOM PROCEDURE TRAY) ×2 IMPLANT
NS IRRIG 1000ML POUR BTL (IV SOLUTION) ×2 IMPLANT
PACK GENERAL/GYN (CUSTOM PROCEDURE TRAY) ×2 IMPLANT
PEN SKIN MARKING BROAD (MISCELLANEOUS) ×2 IMPLANT
SPONGE DRAIN TRACH 4X4 STRL 2S (GAUZE/BANDAGES/DRESSINGS) ×4 IMPLANT
SPONGE GAUZE 4X4 12PLY (GAUZE/BANDAGES/DRESSINGS) IMPLANT
SPONGE LAP 18X18 X RAY DECT (DISPOSABLE) ×4 IMPLANT
STAPLER VISISTAT 35W (STAPLE) ×2 IMPLANT
STOCKINETTE 8 INCH (MISCELLANEOUS) IMPLANT
STRIP CLOSURE SKIN 1/2X4 (GAUZE/BANDAGES/DRESSINGS) ×4 IMPLANT
SUT ETHILON 2 0 PS N (SUTURE) ×4 IMPLANT
SUT MNCRL AB 4-0 PS2 18 (SUTURE) IMPLANT
SUT MON AB 5-0 PS2 18 (SUTURE) ×2 IMPLANT
SUT VIC AB 2-0 SH 18 (SUTURE) ×2 IMPLANT
SUT VIC AB 3-0 SH 8-18 (SUTURE) ×2 IMPLANT
TOWEL OR 17X26 10 PK STRL BLUE (TOWEL DISPOSABLE) ×2 IMPLANT
TRAY FOLEY CATH 14FRSI W/METER (CATHETERS) IMPLANT

## 2012-06-02 NOTE — Interval H&P Note (Signed)
History and Physical Interval Note:  06/02/2012 1:25 PM  Nicole [name]  has presented today for surgery, with the diagnosis of right breast cancer  The various methods of treatment have been discussed with the patient and family. After consideration of risks, benefits and other options for treatment, the patient has consented to  Procedure(s) (LRB): Right mastectomy as a surgical intervention .  The patients' history has been reviewed, patient examined, no change in status, stable for surgery.  I have reviewed the patients' chart and labs.  Questions were answered to the patient's satisfaction.     [name]

## 2012-06-02 NOTE — Op Note (Signed)
Preoperative diagnosis: Stage IV right breast cancer Postoperative diagnosis: Same as above Procedure: Right simple mastectomy Surgeon: Dr. Harden Mo Specimens: #1 right breast marked short stitch superior, long stitch lateral #2 right axillary mass Anesthesia: Gen. Complications: None Drains: 2 19 French Blake drains to right mastectomy space Sponge and count correct x2 at end of operation Disposition to recovery in stable condition  Indications: This is a 56 year old female with stage IV right breast cancer. She was first begun on chemotherapy and was transitioned to a antiestrogen therapy due to her inability to tolerate chemotherapy. She is a very good response in terms of both her primary tumor as well as the stability and improvement in her stage IV disease. In discussion with her medical oncologist as well as the patient we have decided to proceed with a mastectomy for local control at this point due to the fact that she has had such a good response with systemic therapy. We discussed the risks and benefits associated with a right mastectomy.  Procedure: After informed consent was obtained the patient was taken to the operating room. She was administered 1 g of intravenous cefazolin. Sequential compression devices were placed on her legs prior to induction with anesthesia. She was then placed under general anesthesia without complication. Her right breast and axilla were then prepped and draped in the standard sterile fashion. A surgical timeout was performed.  I made an elliptical incision encompassing her nipple areola as well as a from the skin over this right upper outer quadrant tumor. The tumor still felt like it was involving the skin overlying the bullet was free on the pectoralis muscle. Flaps were then created to the sternum, clavicle, inframammary crease, and latissimus laterally. The breast was then removed from the pectoralis muscle including the pectoralis fascia. The tumor  was did not appear grossly to be stuck to the muscle. This was rolled lateral and removed the lateral attachments being ligated with Vicryl suture. This was then passed off the table as above. She had a about a 3 x 3 cm hard mass in her axilla that was the only thing present in her axilla that I also excised and place several clips to take the draining lymphatics out. This was also passed off the table. I then obtained hemostasis. Irrigation was performed. I placed 2 19 [place] Blake drains and secured these with 2-0 nylon. I then closed this with 3-0 Vicryl and 5-0 Monocryl. Steri-Strips and Dermabond were placed. She had sterile dressings placed. A breast binder was placed. She tolerated this well was extubated and transferred to recovery in stable condition.

## 2012-06-02 NOTE — Anesthesia Postprocedure Evaluation (Signed)
  Anesthesia Post-op Note  Patient: Nicole [name]  Procedure(s) Performed: Procedure(s) (LRB): SIMPLE MASTECTOMY (Right)  Patient Location: PACU  Anesthesia Type: General  Level of Consciousness: awake and alert   Airway and Oxygen Therapy: Patient Spontanous Breathing  Post-op Pain: mild  Post-op Assessment: Post-op Vital signs reviewed, Patient's Cardiovascular Status Stable, Respiratory Function Stable, Patent Airway and No signs of Nausea or vomiting  Post-op Vital Signs: stable  Complications: No apparent anesthesia complications

## 2012-06-02 NOTE — H&P (View-Only) (Signed)
Patient ID: Nicole [name], female   DOB: 1956-01-19, 56 y.o.   MRN: [id number]  Chief Complaint  Patient presents with  . Pre-op Exam    HPI Nicole [name] is a 56 y.o. female.   HPI This is a 1 yof who had left breast cancer diagnosed in July of 2011.  On initial presentation had a large bulky mass with multiple enlarged right axillary nodes as well as pectoralis nodules.  She also underwent PET ct that shows multiple bulky right axillary nodes, IM nodes, mediastinal adenopathy and bilateral pulmonary nodules.  This was ER positive at 100, PR at 5 and Her2 neu is not amplified.  She underwent initial trial with tesataxel complicated by side effects and then has been on femara since then.  I have seen her several times in evaluation for possible surgery.  She reports no new complaints today.  She has undergone PET ct recently again with results below.  She has had a remarkable response and she returns today to discuss local therapy.  Past Medical History  Diagnosis Date  . IBD (inflammatory bowel disease)   . Breast CA     breast ca dx 06/2010, stage 4    Past Surgical History  Procedure Date  . Dilation and curettage of uterus 2004?  [name] Portacath placement     History reviewed. No pertinent family history.  Social History History  Substance Use Topics  . Smoking status: Never Smoker   . Smokeless tobacco: Not on file  . Alcohol Use: No    No Known Allergies  Current Outpatient Prescriptions  Medication Sig Dispense Refill  . alendronate (FOSAMAX) 70 MG tablet TAKE 1 TABLET ONCE A WEEK  12 tablet  2  . Calcium Carbonate (CALTRATE 600 PO) Take by mouth 2 (two) times daily.        . Cholecalciferol (VITAMIN D PO) Take by mouth daily.        [name] letrozole (FEMARA) 2.5 MG tablet TAKE 1 TABLET BY MOUTH EVERY DAY  30 tablet  3  . venlafaxine (EFFEXOR-XR) 75 MG 24 hr capsule Take 1 capsule (75 mg total) by mouth daily.  30 capsule  6    Review of Systems Review of Systems    Constitutional: Negative for fever, chills and unexpected weight change.  HENT: Negative for hearing loss, congestion, sore throat, trouble swallowing and voice change.   Eyes: Negative for visual disturbance.  Respiratory: Negative for cough and wheezing.   Cardiovascular: Negative for chest pain, palpitations and leg swelling.  Gastrointestinal: Negative for nausea, vomiting, abdominal pain, diarrhea, constipation, blood in stool, abdominal distention and anal bleeding.  Genitourinary: Negative for hematuria, vaginal bleeding and difficulty urinating.  Musculoskeletal: Negative for arthralgias.  Skin: Negative for rash and wound.  Neurological: Negative for seizures, syncope and headaches.  Hematological: Negative for adenopathy. Does not bruise/bleed easily.  Psychiatric/Behavioral: Negative for confusion.    Blood pressure 140/86, pulse 100, temperature 98.1 F (36.7 C), temperature source Temporal, resp. rate 16, height 5\' 4"  (1.626 m), weight 143 lb 2 oz (64.921 kg).  Physical Exam Physical Exam  Vitals reviewed. Constitutional: She appears well-developed and well-nourished.  Neck: Neck supple.  Cardiovascular: Normal rate, regular rhythm and normal heart sounds.   Pulmonary/Chest: Effort normal and breath sounds normal. She has no wheezes. She has no rales. Right breast exhibits mass and skin change. Right breast exhibits no inverted nipple, no nipple discharge and no tenderness. Left breast exhibits no inverted nipple, no mass,  no nipple discharge, no skin change and no tenderness. Breasts are symmetrical.    Lymphadenopathy:    She has no cervical adenopathy.    Data Reviewed NUCLEAR MEDICINE PET SKULL BASE TO THIGH  Fasting Blood Glucose: 90  Technique: 16.9 mCi F-18 FDG was injected intravenously. CT data  was obtained and used for attenuation correction and anatomic  localization only. (This was not acquired as a diagnostic CT  examination.) Additional exam technical  data entered on  technologist worksheet.  Comparison: PET CT scan 05/16/2011  Findings:  Neck: No hypermetabolic nodes in the neck.  Chest: 16 mm right axillary lymph node does not have significant  metabolic activity and is unchanged in size compared to prior.  Smaller left axillary lymph nodes are also not hypermetabolic. No  hypermetabolic internal mammary or mediastinal lymph nodes. Mild  uptake in the right hilum is felt to be physiologic.  Review of the lung windows demonstrates nodular pleural thickening  along the oblique fissure and within the right upper lobe and right  lower lobe which is not changed from prior. These nodules are not  hypermetabolic.  Abdomen / Pelvis:No abnormal hypermetabolic activity within the  solid organs. No evidence of abdominal or pelvic hypermetabolic  nodes. There are shotty periaortic lymph nodes which are unchanged  from prior.  Skeleton:No focal hypermetabolic activity to suggest skeletal  metastasis.  IMPRESSION:  1. No evidence of breast cancer recurrence.  2. Stable pulmonary nodules.  3. Stable axillary lymph nodes.   BILATERAL BREAST MRI WITH AND WITHOUT CONTRAST  Technique: Multiplanar, multisequence MR images of both breasts  were obtained prior to and following the intravenous administration  of 15ml of Multihance. Three dimensional images were evaluated at  the independent DynaCad workstation.  Comparison: Mammograms dated 08/28/2011 and 07/17/2010.  Comparison is also made to an MRI dated 07/22/2010.  Findings: There is moderate background parenchymal enhancement  pattern. There has been significant interval reduction in the size  of the mass in the upper outer quadrant of the right breast. It  currently measures 2.3 x 1.2 x 1.8 cm. On the prior MRI dated  07/22/2010 it measured 6.4 x 4.4 x 5.0 cm. There is a central  biopsy clip artifact. There are several small satellite nodules  that have also significantly decreased in  size when compared to the  prior exam.  The pectoralis muscle is uplifted and there is retraction and  thickening of the skin.  The enhancing lesions in the pectoralis muscle have resolved but  there is still mild diffuse increased enhancement in the right  pectoralis muscle compared to the left. There has been significant  interval reduction in the enhancing masses in the right axilla  consistent with decreased size in adenopathy.  Previously described mediastinal adenopathy and pulmonary nodules  are not as apparent on today's study.  No abnormal enhancement in the left breast. There is no enlarged  left axillary adenopathy.  The 7 x 9 mm nodule in the anterior lower lobe of the right liver  that is bright on the T2-weighted images is stable.  IMPRESSION:  Significant interval improvement in the size of the mass in the  upper outer quadrant of the right breast as well as in the right  axillary adenopathy, pulmonary nodules and mediastinal adenopathy.  This is consistent with a good response to neoadjuvant  chemotherapy.  Assessment    Stage IV breast cancer    Plan    She returns to discuss surgical therapy  of her stage IV breast cancer. She is a very good response to systemic treatment at this point. I think it is reasonable to consider doing local therapy. On her MRI and her exam it shows significant improvement in the mass but this is also resulted in shrinking that breast. I don't really think a lumpectomy is possible still due to the size of the remaining lesion as well as the defect it is going to create. I think the best option at this point according to consider surgery is to do a mastectomy. I do think there is some role for mastectomy with her disease given her improvement with systemic treatment. I told her that I would not plan on removing anything but any enlarged remaining lymph nodes I felt in her axilla. I do not identify any abnormal nodes at this point. I think her  survival is based primarily on her metastases obviously and there may be some benefit at this point of local control with the tumor in the breast. We discussed a simple mastectomy. The risks include but are not limited to bleeding, infection, wound dehiscence, and return to the operating room. We discussed her postoperative recovery. We discussed that plastic surgery may be an option in the long term depending on her disease and depending on if she gets radiation therapy which certainly may be indicated in her. Again I think this is reasonable given her remarkable response to systemic therapy at this point. We discussed also the long-term risks of mastectomy including chronic pain. She would like to have this done fairly soon and I will plan on getting her to the operating room in the near future.       Mercia Dowe 05/18/2012, 12:39 PM

## 2012-06-02 NOTE — Anesthesia Preprocedure Evaluation (Addendum)
Anesthesia Evaluation  Patient identified by MRN, date of birth, ID band Patient awake  General Assessment Comment:Stage IV Breast Ca  Reviewed: Allergy & Precautions, H&P , NPO status , Patient's Chart, lab work & pertinent test results  Airway Mallampati: II TM Distance: <3 FB Neck ROM: Full    Dental No notable dental hx.    Pulmonary neg pulmonary ROS,  breath sounds clear to auscultation  Pulmonary exam normal       Cardiovascular negative cardio ROS  Rhythm:Regular Rate:Normal     Neuro/Psych negative neurological ROS     GI/Hepatic negative GI ROS, Neg liver ROS,   Endo/Other  negative endocrine ROS  Renal/GU negative Renal ROS  negative genitourinary   Musculoskeletal negative musculoskeletal ROS (+)   Abdominal   Peds negative pediatric ROS (+)  Hematology negative hematology ROS (+)   Anesthesia Other Findings   Reproductive/Obstetrics negative OB ROS                          Anesthesia Physical Anesthesia Plan  ASA: II  Anesthesia Plan: General   Post-op Pain Management:    Induction: Intravenous  Airway Management Planned: LMA  Additional Equipment:   Intra-op Plan:   Post-operative Plan:   Informed Consent: I have reviewed the patients History and Physical, chart, labs and discussed the procedure including the risks, benefits and alternatives for the proposed anesthesia with the patient or authorized representative who has indicated his/her understanding and acceptance.   Dental advisory given  Plan Discussed with: CRNA  Anesthesia Plan Comments:         Anesthesia Quick Evaluation

## 2012-06-02 NOTE — Discharge Instructions (Signed)
[facility] [place] °[phone] ° °MASTECTOMY: POST OP INSTRUCTIONS ° °Always review your discharge instruction sheet given to you by the facility where your surgery was performed. °IF YOU HAVE DISABILITY OR FAMILY LEAVE FORMS, YOU MUST BRING THEM TO THE OFFICE FOR PROCESSING.   °DO NOT GIVE THEM TO YOUR DOCTOR. °A prescription for pain medication may be given to you upon discharge.  Take your pain medication as prescribed, if needed.  If narcotic pain medicine is not needed, then you may take acetaminophen (Tylenol), naprosyn (Alleve) or ibuprofen (Advil) as needed. °1. Take your usually prescribed medications unless otherwise directed. °2. If you need a refill on your pain medication, please contact your pharmacy.  They will contact our office to request authorization.  Prescriptions will not be filled after 5pm or on week-ends. °3. You should follow a light diet the first few days after arrival home, such as soup and crackers, etc.  Resume your normal diet the day after surgery. °4. Most patients will experience some swelling and bruising on the chest and underarm.  Ice packs will help.  Swelling and bruising can take several days to resolve. Wear the binder day and night until you return to the office.  °5. It is common to experience some constipation if taking pain medication after surgery.  Increasing fluid intake and taking a stool softener (such as Colace) will usually help or prevent this problem from occurring.  A mild laxative (Milk of Magnesia or Miralax) should be taken according to package instructions if there are no bowel movements after 48 hours. °6. Unless discharge instructions indicate otherwise, leave your bandage dry and in place until your next appointment in 3-5 days.  You may take a limited sponge bath.  No tube baths or showers until the drains are removed.  You may have steri-strips (small skin tapes) in place directly over the incision.  These strips should be left on the  skin for 7-10 days. If you have glue it will come off in next couple week.  Any sutures will be removed at an office visit °7. DRAINS:  If you have drains in place, it is important to keep a list of the amount of drainage produced each day in your drains.  Before leaving the hospital, you should be instructed on drain care.  Call our office if you have any questions about your drains. I will remove your drains when they put out less than 30 cc or ml for 2 consecutive days. °8. ACTIVITIES:  You may resume regular (light) daily activities beginning the next day--such as daily self-care, walking, climbing stairs--gradually increasing activities as tolerated.  You may have sexual intercourse when it is comfortable.  Refrain from any heavy lifting or straining until approved by your doctor. °a. You may drive when you are no longer taking prescription pain medication, you can comfortably wear a seatbelt, and you can safely maneuver your car and apply brakes. °b. RETURN TO WORK:  __________________________________________________________ °9. You should see your doctor in the office for a follow-up appointment approximately 3-5 days after your surgery.  Your doctor’s nurse will typically make your follow-up appointment when she calls you with your pathology report.  Expect your pathology report 3-4business days after surgery. °10. OTHER INSTRUCTIONS: ______________________________________________________________________________________________ ____________________________________________________________________________________________ °WHEN TO CALL YOUR DR Aldine Grainger: °1. Fever over 101.0 °2. Nausea and/or vomiting °3. Extreme swelling or bruising °4. Continued bleeding from incision. °5. Increased pain, redness, or drainage from the incision. °The clinic staff is available   to answer your questions during regular business hours.  Please don’t hesitate to call and ask to speak to one of the nurses for clinical concerns.  If  you have a medical emergency, go to the nearest emergency room or call 911.  A surgeon from [facility] is always on call at the hospital. °[address], [address], [place], [place]  [address] ? [address] [address], [place], [place]   [address] °[phone] ? [phone] ? FAX [phone] °Web site: [email or web address] ° °

## 2012-06-02 NOTE — Transfer of Care (Signed)
Immediate Anesthesia Transfer of Care Note  Patient: Nicole [name]  Procedure(s) Performed: Procedure(s) (LRB): SIMPLE MASTECTOMY (Right)  Patient Location: PACU  Anesthesia Type: General  Level of Consciousness: awake, alert , oriented, patient cooperative and responds to stimulation  Airway & Oxygen Therapy: Patient Spontanous Breathing and Patient connected to face mask oxygen  Post-op Assessment: Report given to PACU RN, Post -op Vital signs reviewed and stable and Patient moving all extremities X 4  Post vital signs: Reviewed and stable  Complications: No apparent anesthesia complications

## 2012-06-03 ENCOUNTER — Telehealth (INDEPENDENT_AMBULATORY_CARE_PROVIDER_SITE_OTHER): Payer: Self-pay | Admitting: General Surgery

## 2012-06-03 ENCOUNTER — Other Ambulatory Visit: Payer: Self-pay | Admitting: *Deleted

## 2012-06-03 DIAGNOSIS — C50919 Malignant neoplasm of unspecified site of unspecified female breast: Secondary | ICD-10-CM

## 2012-06-03 MED ORDER — OXYCODONE HCL 5 MG PO TABS: 5.0000 mg | ORAL_TABLET | ORAL | Status: DC | PRN

## 2012-06-03 MED ORDER — VENLAFAXINE HCL ER 75 MG PO CP24: 75.0000 mg | ORAL_CAPSULE | Freq: Every evening | ORAL | Status: DC

## 2012-06-03 NOTE — Discharge Summary (Signed)
Physician Discharge Summary  Patient ID: VIRA CHAPLIN MRN: [id number] DOB/AGE: [birth year] 58 y.o.  Admit date: 06/02/2012 Discharge date: 06/03/2012  Admission Diagnoses: Stage IV right breast cancer  Discharge Diagnoses:  Active Problems:  * No active hospital problems. *    Discharged Condition: good  Hospital Course: 22 yof with stage IV breast cancer who in multidisciplinary fashion we have decided to proceed with local control due to her dramatic response on antiestrogen therapy.  She and I discussed a right simple mastectomy.  She was admitted and underwent mastectomy.  Postoperatively she is ambulating, tolerating her diet with the expected drain output.  Consults: None  Significant Diagnostic Studies: none  Treatments: surgery: right simple mastectomy, excision axillary mass    Disposition:home  Discharge Orders    Future Appointments: Provider: Department: Dept Phone: Center:   06/16/2012 11:10 AM [name], MD [facility] [phone] None   06/22/2012 3:00 PM Windell Hummingbird [facility] [phone] None   06/22/2012 4:00 PM [facility] [phone] [place]   06/25/2012 4:00 PM Pierce Crane, MD [facility] [phone] None     Medication List  As of 06/03/2012  8:20 AM   TAKE these medications         alendronate 70 MG tablet   Commonly known as: FOSAMAX   Take 70 mg by mouth every 7 (seven) days. On Wednesdays. Take with a full glass of water on an empty stomach.      CALTRATE 600 PO   Take by mouth 2 (two) times daily.      letrozole 2.5 MG tablet   Commonly known as: FEMARA   Take 2.5 mg by mouth every evening.      oxyCODONE 5 MG immediate release tablet   Commonly known as: Oxy IR/ROXICODONE   Take 1 tablet (5 mg total) by mouth every 4 (four) hours as needed.      venlafaxine XR 75 MG 24 hr capsule   Commonly known as: EFFEXOR-XR   Take 75 mg by mouth every evening.      VITAMIN D PO   Take 2,000 Units by mouth 2 (two)  times daily.           Follow-up Information    Follow up with [facility], [place]. Schedule an appointment as soon as possible for a visit in 1 week. (We will call with appointment)    Contact information:   [facility] [address] [place] [address] [phone]          Signed: [name] 06/03/2012, 8:20 AM

## 2012-06-03 NOTE — Progress Notes (Signed)
UR complete 

## 2012-06-03 NOTE — Telephone Encounter (Signed)
Pt calling with generalized questions about caring for drainage tubes at home.  She understands how to drain them, but was unsure how to dispose of the output.  Recommended she pour in the toilet to flush.  She states she has good pain control with meds and is doing well so far.

## 2012-06-03 NOTE — Progress Notes (Signed)
1 Day Post-Op  Subjective: Doing well no complaints  Objective: Vital signs in last 24 hours: Temp:  [97.3 F (36.3 C)-98.5 F (36.9 C)] 98.3 F (36.8 C) (06/05 0645) Pulse Rate:  [79-95] 80  (06/05 0645) Resp:  [9-20] 20  (06/05 0645) BP: (97-161)/(55-90) 112/70 mmHg (06/05 0645) SpO2:  [97 %-100 %] 97 % (06/05 0645) Weight:  [143 lb (64.864 kg)] 143 lb (64.864 kg) (06/04 1630) Last BM Date: 06/01/12  Intake/Output from previous day: 06/04 0701 - 06/05 0700 In: 2420 [P.O.:340; I.V.:2080] Out: 2170 [Urine:2000; Drains:145; Blood:25] Intake/Output this shift:    General appearance: no distress Incision/Wound:flaps viable, drains with expected serosang output    Assessment/Plan: POD 1 right mastectomy  Dc home today Will see early next week and call with path  LOS: 1 day    [facility] 06/03/2012

## 2012-06-08 ENCOUNTER — Encounter (INDEPENDENT_AMBULATORY_CARE_PROVIDER_SITE_OTHER): Payer: Self-pay | Admitting: General Surgery

## 2012-06-08 ENCOUNTER — Ambulatory Visit (INDEPENDENT_AMBULATORY_CARE_PROVIDER_SITE_OTHER): Payer: BC Managed Care – PPO | Admitting: General Surgery

## 2012-06-08 VITALS — BP 124/86 | HR 98 | Temp 98.2°F | Resp 16 | Ht 64.0 in | Wt 137.4 lb

## 2012-06-08 DIAGNOSIS — Z09 Encounter for follow-up examination after completed treatment for conditions other than malignant neoplasm: Secondary | ICD-10-CM

## 2012-06-08 NOTE — Progress Notes (Signed)
Subjective:     Patient ID: Nicole [name], female   DOB: June 01, 1956, 56 y.o.   MRN: [id number]  HPI This is a 56 yof with stage IV right breast cancer who has done well with systemic therapy.  She was discussed and we decided to proceed with right mastectomy for local control.  She underwent right mastectomy and excision of axillary mass last week. She is doing well.  Drains are both putting out about 30 cc of serous fluid.  Her path shows three involved nodes and a 3.2 cm cancer.   Review of Systems     Objective:   Physical Exam Healing incision with viable flaps    Assessment:     S/p right mastectomy Stage IV breast cancer    Plan:     Return later this week to begin removing drains.

## 2012-06-12 ENCOUNTER — Encounter (INDEPENDENT_AMBULATORY_CARE_PROVIDER_SITE_OTHER): Payer: Self-pay | Admitting: General Surgery

## 2012-06-12 ENCOUNTER — Ambulatory Visit (INDEPENDENT_AMBULATORY_CARE_PROVIDER_SITE_OTHER): Payer: BC Managed Care – PPO | Admitting: General Surgery

## 2012-06-12 VITALS — BP 132/82 | HR 92 | Temp 98.5°F | Resp 14 | Ht 64.0 in | Wt 141.2 lb

## 2012-06-12 DIAGNOSIS — Z09 Encounter for follow-up examination after completed treatment for conditions other than malignant neoplasm: Secondary | ICD-10-CM

## 2012-06-12 NOTE — Progress Notes (Signed)
Subjective:     Patient ID: Nicole [name], female   DOB: [birth year], 56 y.o.   MRN: [id number]  HPI 15 yof s/p right mastectomy for stage 4 breast cancer after good response to treatment.  She returns today for drain removal. She has no real complaints. Both drains less than 30 cc in last 24 hours plus.  Review of Systems     Objective:   Physical Exam    healing right mastectomy incision without infection, drains with serosang fluid Assessment:     S/p right mastectomy Stage IV right breast cancer    Plan:     I removed one drain today and will likely remove the other one next week

## 2012-06-16 ENCOUNTER — Other Ambulatory Visit: Payer: Self-pay | Admitting: Oncology

## 2012-06-16 ENCOUNTER — Telehealth (INDEPENDENT_AMBULATORY_CARE_PROVIDER_SITE_OTHER): Payer: Self-pay

## 2012-06-16 ENCOUNTER — Ambulatory Visit (HOSPITAL_BASED_OUTPATIENT_CLINIC_OR_DEPARTMENT_OTHER): Payer: BC Managed Care – PPO | Admitting: Oncology

## 2012-06-16 ENCOUNTER — Ambulatory Visit (HOSPITAL_COMMUNITY)
Admission: RE | Admit: 2012-06-16 | Discharge: 2012-06-16 | Disposition: A | Discharge status: Home / Self Care | Payer: BC Managed Care – PPO | Source: Ambulatory Visit | Attending: General Surgery | Admitting: General Surgery

## 2012-06-16 ENCOUNTER — Encounter: Payer: Self-pay | Admitting: *Deleted

## 2012-06-16 ENCOUNTER — Ambulatory Visit (HOSPITAL_BASED_OUTPATIENT_CLINIC_OR_DEPARTMENT_OTHER): Payer: BC Managed Care – PPO

## 2012-06-16 ENCOUNTER — Ambulatory Visit (INDEPENDENT_AMBULATORY_CARE_PROVIDER_SITE_OTHER): Payer: BC Managed Care – PPO | Admitting: General Surgery

## 2012-06-16 ENCOUNTER — Encounter (INDEPENDENT_AMBULATORY_CARE_PROVIDER_SITE_OTHER): Payer: Self-pay | Admitting: General Surgery

## 2012-06-16 ENCOUNTER — Telehealth: Payer: Self-pay | Admitting: *Deleted

## 2012-06-16 VITALS — BP 132/81 | HR 99 | Temp 98.5°F | Ht 64.0 in | Wt 140.8 lb

## 2012-06-16 VITALS — BP 150/91 | Temp 98.2°F

## 2012-06-16 DIAGNOSIS — M79609 Pain in unspecified limb: Secondary | ICD-10-CM | POA: Insufficient documentation

## 2012-06-16 DIAGNOSIS — O2230 Deep phlebothrombosis in pregnancy, unspecified trimester: Secondary | ICD-10-CM

## 2012-06-16 DIAGNOSIS — M7989 Other specified soft tissue disorders: Secondary | ICD-10-CM

## 2012-06-16 DIAGNOSIS — Z09 Encounter for follow-up examination after completed treatment for conditions other than malignant neoplasm: Secondary | ICD-10-CM

## 2012-06-16 DIAGNOSIS — I82629 Acute embolism and thrombosis of deep veins of unspecified upper extremity: Secondary | ICD-10-CM

## 2012-06-16 DIAGNOSIS — D696 Thrombocytopenia, unspecified: Secondary | ICD-10-CM

## 2012-06-16 DIAGNOSIS — Z170 Estrogen receptor positive status [ER+]: Secondary | ICD-10-CM

## 2012-06-16 DIAGNOSIS — C50919 Malignant neoplasm of unspecified site of unspecified female breast: Secondary | ICD-10-CM

## 2012-06-16 LAB — CBC WITH DIFFERENTIAL/PLATELET
BASO%: 0.7 % (ref 0.0–2.0)
Basophils Absolute: 0 10*3/uL (ref 0.0–0.1)
EOS%: 0 % (ref 0.0–7.0)
HGB: 13.6 g/dL (ref 11.6–15.9)
MCH: 30.5 pg (ref 25.1–34.0)
MCHC: 35.1 g/dL (ref 31.5–36.0)
MCV: 86.8 fL (ref 79.5–101.0)
MONO%: 5.5 % (ref 0.0–14.0)
NEUT%: 67.5 % (ref 38.4–76.8)
RDW: 13.8 % (ref 11.2–14.5)
lymph#: 1.6 10*3/uL (ref 0.9–3.3)

## 2012-06-16 LAB — PROTIME-INR
INR: 1 — ABNORMAL LOW (ref 2.00–3.50)
Protime: 12 Seconds (ref 10.6–13.4)

## 2012-06-16 MED ORDER — ENOXAPARIN SODIUM 120 MG/0.8ML ~~LOC~~ SOLN
120.0000 mg | Freq: Once | SUBCUTANEOUS | Status: AC
  Administered 2012-06-16: 120 mg via SUBCUTANEOUS
  Filled 2012-06-16: qty 0.8

## 2012-06-16 MED ORDER — WARFARIN SODIUM 7.5 MG PO TABS: 7.5000 mg | ORAL_TABLET | Freq: Every day | ORAL | Status: DC

## 2012-06-16 NOTE — Telephone Encounter (Signed)
Made patient appointment for injection and lab made patient appointment for ct 06-22-2012 arrival time 3:45pm printed out calendar and gave to the patient

## 2012-06-16 NOTE — Progress Notes (Signed)
Hematology and Oncology Follow Up Visit  Nicole [name] [id number] 1956/10/07 56 y.o. 06/16/2012 10:52 PM   Principle Diagnosis: Locally advanced breast cancer with metastases currently on Femara. She was  diagnosed July 2011. Initial trial with tesataxel, complicated by persistent thrombocytopenia. She has been on Femara for close to 2 years with excellent clinical response. Most recent PET scan performed in February of this year showed minimal if any uptake seen.  Interim History:  Nicole [name] had surgery 2 weeks ago, she had a residual 3.2 cm mass , with 3/3 involved lymph nodes, final pathology was [id number].[name] She has recently developed swelling of the left arm and neck. A doppler study has shown an occluded lt IJ  Medications: I have reviewed the patient's current medications.  Allergies: No Known Allergies  Past Medical History, Surgical history, Social history, and Family History were reviewed and updated.  Review of Systems: Constitutional:  Negative for fever, chills, night sweats, anorexia, weight loss, pain. Cardiovascular: no chest pain or dyspnea on exertion Respiratory: no cough, shortness of breath, or wheezing Neurological: no TIA or stroke symptoms Dermatological: negative ENT: negative Skin Gastrointestinal: no abdominal pain, change in bowel habits, or black or bloody stools Genito-Urinary: no dysuria, trouble voiding, or hematuria Hematological and Lymphatic: negative Breast: . Musculoskeletal: positive for - joint pain Remaining ROS negative.  Physical Exam: Blood pressure 150/91, temperature 98.2 F (36.8 C), temperature source Oral. ECOG: 0 General appearance: alert, cooperative and appears stated age Head: Normocephalic, without obvious abnormality, atraumatic Neck: no adenopathy, no carotid bruit, no JVD, supple, symmetrical, trachea midline and thyroid not enlarged, symmetric, no tenderness/mass/nodules Lymph nodes: Cervical, supraclavicular, and axillary  nodes normal. Cardiac : normal Pulmonary:normal Breasts: s/p rt mastectomy. Abdomen: Normal Extremities lt arm swelling  Neuro:  Lab Results: Lab Results  Component Value Date   WBC 6.0 06/16/2012   HGB 13.6 06/16/2012   HCT 38.7 06/16/2012   MCV 86.8 06/16/2012   PLT 108* 06/16/2012     Chemistry      Component Value Date/Time   NA 139 05/28/2012 1140   K 4.0 05/28/2012 1140   CL 102 05/28/2012 1140   CO2 27 05/28/2012 1140   BUN 11 05/28/2012 1140   CREATININE 0.99 05/28/2012 1140      Component Value Date/Time   CALCIUM 9.7 05/28/2012 1140   ALKPHOS 60 04/22/2012 1459   AST 21 04/22/2012 1459   ALT 27 04/22/2012 1459   BILITOT 0.7 04/22/2012 1459       Radiological Studies: chest X-ray n/a n/a Mammogram n/a Bone density n/a  Impression and Plan: Residual er+ N+ breast cancer s/p mrm. She will see rad onc this week. We will begin lovenox and coumadin. She will likley remain on long term anticoagulation, given her stage 4 disease. 45 minutes was spent with this patient half the time and patient-related counseling   Pierce Crane M.D. FR  More than 50% of the visit was spent in patient-related counselling   Pierce Crane, MD 6/18/201310:52 PM

## 2012-06-16 NOTE — Progress Notes (Signed)
Did patient teaching with husband, so he may administer lovenox at home on Sunday, when clinic is closed.

## 2012-06-16 NOTE — Progress Notes (Signed)
Left upper extremity venous duplex completed.  Preliminary report is positive for DVT in the left jugular vein.

## 2012-06-16 NOTE — Progress Notes (Signed)
Subjective:     Patient ID: Nicole [name], female   DOB: [birth year], 56 y.o.   MRN: [id number]  HPI 56 yof with stage IV breast cancer s/p right mastectomy who presents for follow up.  Last drain has < 20 cc output for several days.  She also states her left arm( side of her port) is a little swollen and looks different than the other arm.  Review of Systems     Objective:   Physical Exam Right mastectomy incision clean without infection, drain with minimal fluid, flaps viable Left UE has some blanching as well as mild edema compared to right    Assessment:     S/p right mastectomy    Plan:     I removed drain today, will see next week I think port is ok and I have low suspicion for dvt but due to history etc will get cxr to check port and u/s to ensure no dvt

## 2012-06-16 NOTE — Telephone Encounter (Signed)
[facility] Doppler calling with call report that pt is positive for DVT left jugular vein. Dr Dwain Sarna called Dr Pierce Crane to see if could see pt today for anticoagulation therapy and he agreed. The pt was notified to go to [facility] now to see Dr Donnie Coffin.

## 2012-06-17 ENCOUNTER — Telehealth (INDEPENDENT_AMBULATORY_CARE_PROVIDER_SITE_OTHER): Payer: Self-pay

## 2012-06-17 ENCOUNTER — Ambulatory Visit (HOSPITAL_BASED_OUTPATIENT_CLINIC_OR_DEPARTMENT_OTHER): Payer: BC Managed Care – PPO

## 2012-06-17 VITALS — BP 147/89 | HR 90 | Temp 98.1°F

## 2012-06-17 DIAGNOSIS — I82629 Acute embolism and thrombosis of deep veins of unspecified upper extremity: Secondary | ICD-10-CM

## 2012-06-17 DIAGNOSIS — C50919 Malignant neoplasm of unspecified site of unspecified female breast: Secondary | ICD-10-CM

## 2012-06-17 DIAGNOSIS — O2230 Deep phlebothrombosis in pregnancy, unspecified trimester: Secondary | ICD-10-CM

## 2012-06-17 MED ORDER — ENOXAPARIN SODIUM 120 MG/0.8ML ~~LOC~~ SOLN
120.0000 mg | Freq: Once | SUBCUTANEOUS | Status: AC
  Administered 2012-06-17: 120 mg via SUBCUTANEOUS
  Filled 2012-06-17: qty 0.8

## 2012-06-17 NOTE — Telephone Encounter (Signed)
LMOM giving pt her f/u appt with Dr Dwain Sarna for 6/28 arrive at 8:30 for 8:45.

## 2012-06-18 ENCOUNTER — Ambulatory Visit (HOSPITAL_BASED_OUTPATIENT_CLINIC_OR_DEPARTMENT_OTHER): Payer: BC Managed Care – PPO

## 2012-06-18 VITALS — BP 140/88 | HR 91 | Temp 97.3°F

## 2012-06-18 DIAGNOSIS — I82629 Acute embolism and thrombosis of deep veins of unspecified upper extremity: Secondary | ICD-10-CM

## 2012-06-18 DIAGNOSIS — C50919 Malignant neoplasm of unspecified site of unspecified female breast: Secondary | ICD-10-CM

## 2012-06-18 DIAGNOSIS — O2230 Deep phlebothrombosis in pregnancy, unspecified trimester: Secondary | ICD-10-CM

## 2012-06-18 MED ORDER — ENOXAPARIN SODIUM 120 MG/0.8ML ~~LOC~~ SOLN
120.0000 mg | Freq: Once | SUBCUTANEOUS | Status: AC
  Administered 2012-06-18: 120 mg via SUBCUTANEOUS
  Filled 2012-06-18: qty 0.8

## 2012-06-19 ENCOUNTER — Telehealth: Payer: Self-pay | Admitting: Oncology

## 2012-06-19 ENCOUNTER — Ambulatory Visit (HOSPITAL_BASED_OUTPATIENT_CLINIC_OR_DEPARTMENT_OTHER): Payer: BC Managed Care – PPO

## 2012-06-19 VITALS — BP 147/85 | HR 97 | Temp 98.2°F

## 2012-06-19 DIAGNOSIS — C50919 Malignant neoplasm of unspecified site of unspecified female breast: Secondary | ICD-10-CM

## 2012-06-19 DIAGNOSIS — O2230 Deep phlebothrombosis in pregnancy, unspecified trimester: Secondary | ICD-10-CM

## 2012-06-19 DIAGNOSIS — I82629 Acute embolism and thrombosis of deep veins of unspecified upper extremity: Secondary | ICD-10-CM

## 2012-06-19 MED ORDER — ENOXAPARIN SODIUM 100 MG/ML ~~LOC~~ SOLN
100.0000 mg | Freq: Once | SUBCUTANEOUS | Status: AC
  Administered 2012-06-19: 100 mg via SUBCUTANEOUS

## 2012-06-19 NOTE — Telephone Encounter (Signed)
Pt stopped by and to r/s lab from 6/25 to 6/24.   Feels it was an error.

## 2012-06-20 ENCOUNTER — Ambulatory Visit (HOSPITAL_BASED_OUTPATIENT_CLINIC_OR_DEPARTMENT_OTHER): Payer: BC Managed Care – PPO

## 2012-06-20 VITALS — BP 145/88 | HR 91 | Temp 98.0°F

## 2012-06-20 DIAGNOSIS — C50919 Malignant neoplasm of unspecified site of unspecified female breast: Secondary | ICD-10-CM

## 2012-06-20 DIAGNOSIS — I82629 Acute embolism and thrombosis of deep veins of unspecified upper extremity: Secondary | ICD-10-CM

## 2012-06-20 DIAGNOSIS — O2230 Deep phlebothrombosis in pregnancy, unspecified trimester: Secondary | ICD-10-CM

## 2012-06-20 MED ORDER — ENOXAPARIN SODIUM 100 MG/ML ~~LOC~~ SOLN
100.0000 mg | Freq: Once | SUBCUTANEOUS | Status: AC
  Administered 2012-06-20: 100 mg via SUBCUTANEOUS

## 2012-06-20 NOTE — Progress Notes (Signed)
1220 Lovenox injection administered. Patient and spouse educated on Sunday administration and both expressed understanding of how to administer. All questions answered.

## 2012-06-22 ENCOUNTER — Ambulatory Visit (HOSPITAL_COMMUNITY)
Admission: RE | Admit: 2012-06-22 | Discharge: 2012-06-22 | Disposition: A | Discharge status: Home / Self Care | Payer: BC Managed Care – PPO | Source: Ambulatory Visit | Attending: Oncology | Admitting: Oncology

## 2012-06-22 ENCOUNTER — Other Ambulatory Visit: Payer: Self-pay | Admitting: Oncology

## 2012-06-22 ENCOUNTER — Other Ambulatory Visit: Payer: BC Managed Care – PPO | Admitting: Lab

## 2012-06-22 ENCOUNTER — Ambulatory Visit (HOSPITAL_BASED_OUTPATIENT_CLINIC_OR_DEPARTMENT_OTHER): Payer: BC Managed Care – PPO

## 2012-06-22 VITALS — BP 155/89 | HR 89 | Temp 98.7°F

## 2012-06-22 DIAGNOSIS — O2230 Deep phlebothrombosis in pregnancy, unspecified trimester: Secondary | ICD-10-CM

## 2012-06-22 DIAGNOSIS — C50919 Malignant neoplasm of unspecified site of unspecified female breast: Secondary | ICD-10-CM

## 2012-06-22 DIAGNOSIS — I82629 Acute embolism and thrombosis of deep veins of unspecified upper extremity: Secondary | ICD-10-CM

## 2012-06-22 DIAGNOSIS — R918 Other nonspecific abnormal finding of lung field: Secondary | ICD-10-CM | POA: Insufficient documentation

## 2012-06-22 DIAGNOSIS — I82409 Acute embolism and thrombosis of unspecified deep veins of unspecified lower extremity: Secondary | ICD-10-CM

## 2012-06-22 DIAGNOSIS — Z9010 Acquired absence of unspecified breast and nipple: Secondary | ICD-10-CM | POA: Insufficient documentation

## 2012-06-22 DIAGNOSIS — K7689 Other specified diseases of liver: Secondary | ICD-10-CM | POA: Insufficient documentation

## 2012-06-22 DIAGNOSIS — N200 Calculus of kidney: Secondary | ICD-10-CM | POA: Insufficient documentation

## 2012-06-22 MED ORDER — ENOXAPARIN SODIUM 100 MG/ML ~~LOC~~ SOLN
100.0000 mg | Freq: Once | SUBCUTANEOUS | Status: AC
  Administered 2012-06-22: 100 mg via SUBCUTANEOUS
  Filled 2012-06-22: qty 1

## 2012-06-22 MED ORDER — IOHEXOL 300 MG/ML  SOLN
100.0000 mL | Freq: Once | INTRAMUSCULAR | Status: AC | PRN
  Administered 2012-06-22: 100 mL via INTRAVENOUS

## 2012-06-22 MED ORDER — ENOXAPARIN SODIUM 100 MG/ML ~~LOC~~ SOLN
100.0000 mg | Freq: Once | SUBCUTANEOUS | Status: DC
  Filled 2012-06-22: qty 1

## 2012-06-23 ENCOUNTER — Other Ambulatory Visit: Payer: BC Managed Care – PPO | Admitting: Lab

## 2012-06-23 ENCOUNTER — Ambulatory Visit (HOSPITAL_BASED_OUTPATIENT_CLINIC_OR_DEPARTMENT_OTHER): Payer: BC Managed Care – PPO

## 2012-06-23 VITALS — BP 143/87 | HR 93 | Temp 98.2°F

## 2012-06-23 DIAGNOSIS — I82629 Acute embolism and thrombosis of deep veins of unspecified upper extremity: Secondary | ICD-10-CM

## 2012-06-23 DIAGNOSIS — O2230 Deep phlebothrombosis in pregnancy, unspecified trimester: Secondary | ICD-10-CM

## 2012-06-23 DIAGNOSIS — C50919 Malignant neoplasm of unspecified site of unspecified female breast: Secondary | ICD-10-CM

## 2012-06-23 MED ORDER — ENOXAPARIN SODIUM 100 MG/ML ~~LOC~~ SOLN
100.0000 mg | Freq: Once | SUBCUTANEOUS | Status: AC
  Administered 2012-06-23: 100 mg via SUBCUTANEOUS
  Filled 2012-06-23: qty 1

## 2012-06-24 ENCOUNTER — Ambulatory Visit (HOSPITAL_BASED_OUTPATIENT_CLINIC_OR_DEPARTMENT_OTHER): Payer: BC Managed Care – PPO

## 2012-06-24 VITALS — BP 144/88 | HR 80 | Temp 98.3°F

## 2012-06-24 DIAGNOSIS — I82629 Acute embolism and thrombosis of deep veins of unspecified upper extremity: Secondary | ICD-10-CM

## 2012-06-24 DIAGNOSIS — O2230 Deep phlebothrombosis in pregnancy, unspecified trimester: Secondary | ICD-10-CM

## 2012-06-24 DIAGNOSIS — C50919 Malignant neoplasm of unspecified site of unspecified female breast: Secondary | ICD-10-CM

## 2012-06-24 MED ORDER — ENOXAPARIN SODIUM 100 MG/ML ~~LOC~~ SOLN
100.0000 mg | Freq: Once | SUBCUTANEOUS | Status: AC
  Administered 2012-06-24: 100 mg via SUBCUTANEOUS
  Filled 2012-06-24: qty 1

## 2012-06-25 ENCOUNTER — Ambulatory Visit: Payer: BC Managed Care – PPO | Admitting: Lab

## 2012-06-25 ENCOUNTER — Other Ambulatory Visit: Payer: BC Managed Care – PPO | Admitting: Lab

## 2012-06-25 ENCOUNTER — Ambulatory Visit: Payer: BC Managed Care – PPO

## 2012-06-25 ENCOUNTER — Ambulatory Visit (HOSPITAL_BASED_OUTPATIENT_CLINIC_OR_DEPARTMENT_OTHER): Payer: BC Managed Care – PPO | Admitting: Oncology

## 2012-06-25 ENCOUNTER — Telehealth: Payer: Self-pay | Admitting: *Deleted

## 2012-06-25 ENCOUNTER — Other Ambulatory Visit: Payer: Self-pay | Admitting: *Deleted

## 2012-06-25 VITALS — BP 148/89 | HR 94 | Temp 98.2°F | Ht 64.0 in | Wt 141.3 lb

## 2012-06-25 DIAGNOSIS — C50919 Malignant neoplasm of unspecified site of unspecified female breast: Secondary | ICD-10-CM

## 2012-06-25 DIAGNOSIS — C50419 Malignant neoplasm of upper-outer quadrant of unspecified female breast: Secondary | ICD-10-CM

## 2012-06-25 DIAGNOSIS — O2230 Deep phlebothrombosis in pregnancy, unspecified trimester: Secondary | ICD-10-CM

## 2012-06-25 DIAGNOSIS — Z170 Estrogen receptor positive status [ER+]: Secondary | ICD-10-CM

## 2012-06-25 DIAGNOSIS — I82629 Acute embolism and thrombosis of deep veins of unspecified upper extremity: Secondary | ICD-10-CM

## 2012-06-25 LAB — PROTIME-INR: INR: 2.8 (ref 2.00–3.50)

## 2012-06-25 MED ORDER — WARFARIN SODIUM 5 MG PO TABS: 5.0000 mg | ORAL_TABLET | Freq: Every day | ORAL | Status: DC

## 2012-06-25 NOTE — Progress Notes (Signed)
Hematology and Oncology Follow Up Visit  Nicole [name] [id number] September 30, 1956 56 y.o. 06/25/2012 5:36 PM   Principle Diagnosis: Locally advanced breast cancer with metastases currently on Femara. She was  diagnosed July 2011. Initial trial with tesataxel, complicated by persistent thrombocytopenia. She has been on Femara for close to 2 years with excellent clinical response. Most recent PET scan performed in February of this year showed minimal if any uptake seen.  Interim History:  Nicole [name] had surgery 2 weeks ago, she had a residual 3.2 cm mass , with 3/3 involved lymph nodes, final pathology was [id number].[name] She has recently developed swelling of the left arm and neck. A doppler study has shown an occluded lt IJ  Medications: I have reviewed the patient's current medications.  Allergies: No Known Allergies  Past Medical History, Surgical history, Social history, and Family History were reviewed and updated.  Review of Systems: Constitutional:  Negative for fever, chills, night sweats, anorexia, weight loss, pain. Cardiovascular: no chest pain or dyspnea on exertion Respiratory: no cough, shortness of breath, or wheezing Neurological: no TIA or stroke symptoms Dermatological: negative ENT: negative Skin Gastrointestinal: no abdominal pain, change in bowel habits, or black or bloody stools Genito-Urinary: no dysuria, trouble voiding, or hematuria Hematological and Lymphatic: negative Breast: . Musculoskeletal: positive for - joint pain Remaining ROS negative.  Physical Exam: Blood pressure 148/89, pulse 94, temperature 98.2 F (36.8 C), temperature source Oral, height 5\' 4"  (1.626 m), weight 141 lb 4.8 oz (64.093 kg). ECOG: 0 General appearance: alert, cooperative and appears stated age Head: Normocephalic, without obvious abnormality, atraumatic Neck: no adenopathy, no carotid bruit, no JVD, supple, symmetrical, trachea midline and thyroid not enlarged, symmetric, no  tenderness/mass/nodules Lymph nodes: Cervical, supraclavicular, and axillary nodes normal. Cardiac : normal Pulmonary:normal Breasts: s/p rt mastectomy. Abdomen: Normal Extremities lt arm swelling  Neuro:  Lab Results: Lab Results  Component Value Date   WBC 6.0 06/16/2012   HGB 13.6 06/16/2012   HCT 38.7 06/16/2012   MCV 86.8 06/16/2012   PLT 108* 06/16/2012     Chemistry      Component Value Date/Time   NA 139 05/28/2012 1140   K 4.0 05/28/2012 1140   CL 102 05/28/2012 1140   CO2 27 05/28/2012 1140   BUN 11 05/28/2012 1140   CREATININE 0.99 05/28/2012 1140      Component Value Date/Time   CALCIUM 9.7 05/28/2012 1140   ALKPHOS 60 04/22/2012 1459   AST 21 04/22/2012 1459   ALT 27 04/22/2012 1459   BILITOT 0.7 04/22/2012 1459       Radiological Studies: chest X-ray n/a n/a Mammogram n/a Bone density n/a  Impression and Plan: Residual er+ N+ breast cancer s/p mrm, with excellent response to Femara. Recent history of left IJ DVT on Lovenox Coumadin. INR today 2.8, therefore Lovenox will be discontinued and Coumadin we'll continue. She is currently on 7.5 mg daily. I recommended she alternate 7.5 and 5 mg. She'll be seen next week on July 1 for a repeat INR. Thereafter she'll likely INR is checked weekly and then will be seen formally by myself in about 2 months.  Nicole [name] M.D. [facility]  More than 50% of the visit was spent in patient-related counselling   Nicole Crane, MD 6/27/20135:36 PM

## 2012-06-25 NOTE — Progress Notes (Signed)
Patient INR 2.8.  Does not need Lovenox injection today as per Dr Donnie Coffin

## 2012-06-25 NOTE — Telephone Encounter (Signed)
PER VERBAL ORDERS FROM DESK NURSE PLACE PATIENT ON THE WALK IN LIST FOR 06-25-2012 AT 3:30PM

## 2012-06-26 ENCOUNTER — Ambulatory Visit (INDEPENDENT_AMBULATORY_CARE_PROVIDER_SITE_OTHER): Payer: BC Managed Care – PPO | Admitting: General Surgery

## 2012-06-26 ENCOUNTER — Encounter (INDEPENDENT_AMBULATORY_CARE_PROVIDER_SITE_OTHER): Payer: Self-pay | Admitting: General Surgery

## 2012-06-26 VITALS — BP 134/84 | HR 90 | Temp 97.2°F | Ht 64.0 in | Wt 141.6 lb

## 2012-06-26 DIAGNOSIS — Z09 Encounter for follow-up examination after completed treatment for conditions other than malignant neoplasm: Secondary | ICD-10-CM

## 2012-06-26 NOTE — Progress Notes (Signed)
Subjective:     Patient ID: Nicole [name], female   DOB: 1956/03/10, 56 y.o.   MRN: [id number]  HPI 56 yof with stage IV breast cancer and great response to AE therapy.  She underwent right sm and resection of axillary mass.  She has done well. Drains are out.  Last visit she had left UE complaints on side of port and u/s showed left IJ dvt.  She is now on coumadin.  No complaints at mastectomy site.  Review of Systems Study information:  Add-on. Study status: Routine. Procedure: A vascular evaluation was performed with the patient in the supine position. Image quality was adequate. The left internal jugular, left subclavian, left axillary, left basilic, left cephalic, left brachial, left radial, left ulnar, right internal jugular and right subclavian veins were studied. Left upper extremity venous duplex study. Doppler flow study including B-mode compression maneuvers of all visualized segments, color flow Doppler and selected views of pulsed wave Doppler. Location: Vascular laboratory. Patient status: Outpatient.  Findings consistent with acute deep vein thrombosis involving the left jugular vein.       Objective:   Physical Exam  Pulmonary/Chest:         Assessment:     Stage IV right breast cancer    Plan:     Due to see rad onc soon Continue anticoagulation I will see in 3 months or sooner if needed Referral for pt class and post mastectomy supplies given

## 2012-06-29 ENCOUNTER — Encounter: Payer: Self-pay | Admitting: Radiation Oncology

## 2012-06-29 ENCOUNTER — Other Ambulatory Visit: Payer: BC Managed Care – PPO | Admitting: Lab

## 2012-06-29 ENCOUNTER — Telehealth: Payer: Self-pay | Admitting: *Deleted

## 2012-06-29 ENCOUNTER — Ambulatory Visit
Admission: RE | Admit: 2012-06-29 | Discharge: 2012-06-29 | Disposition: A | Discharge status: Home / Self Care | Payer: BC Managed Care – PPO | Source: Ambulatory Visit | Attending: Radiation Oncology | Admitting: Radiation Oncology

## 2012-06-29 ENCOUNTER — Other Ambulatory Visit: Payer: Self-pay | Admitting: Physician Assistant

## 2012-06-29 VITALS — BP 145/98 | HR 93 | Temp 97.8°F | Resp 18 | Ht 64.0 in | Wt 142.4 lb

## 2012-06-29 DIAGNOSIS — O2230 Deep phlebothrombosis in pregnancy, unspecified trimester: Secondary | ICD-10-CM

## 2012-06-29 DIAGNOSIS — Z7901 Long term (current) use of anticoagulants: Secondary | ICD-10-CM | POA: Insufficient documentation

## 2012-06-29 DIAGNOSIS — Z510 Encounter for antineoplastic radiation therapy: Secondary | ICD-10-CM | POA: Insufficient documentation

## 2012-06-29 DIAGNOSIS — C50919 Malignant neoplasm of unspecified site of unspecified female breast: Secondary | ICD-10-CM

## 2012-06-29 DIAGNOSIS — I82409 Acute embolism and thrombosis of unspecified deep veins of unspecified lower extremity: Secondary | ICD-10-CM

## 2012-06-29 DIAGNOSIS — Z79899 Other long term (current) drug therapy: Secondary | ICD-10-CM | POA: Insufficient documentation

## 2012-06-29 LAB — PROTIME-INR: INR: 4.4 — ABNORMAL HIGH (ref 2.00–3.50)

## 2012-06-29 NOTE — Progress Notes (Signed)
Patient presents to the clinic today accompanied by her husband for a new consult with Dr. Roselind Messier to discuss the role of RT in the treatment of stage IV breast ca. Patient is alert and oriented to person, place and time. No distress noted. Steady gait noted. Pleasant affect noted. Patient denies pain at this time. Patient reports a numb sensation at her right axilla. Patient reports approximately one week ago she had the final drain removed from surgery. Mastectomy done 06/02/2012. Patient reports incision from surgery have closed and healed well. Surgical incisions are without redness, drainage, and edema. Patient reports working full time from home. Patient accompanied today by her husband of 23 years. Patient denies nausea, vomiting, headache, dizziness, or diplopia. Patient reports eating and sleeping without difficulty. Reported all findings to Dr. Roselind Messier.    NKDA Denies having a pacemaker No hx of RT

## 2012-06-29 NOTE — Progress Notes (Signed)
See progress note under physician encounter. 

## 2012-06-29 NOTE — Progress Notes (Signed)
Complete PATIENT MEASURE OF DISTRESS worksheet with a score of 2 submitted to social work.  

## 2012-06-29 NOTE — Telephone Encounter (Signed)
PER CS HAVE NOTIFIED PT SPOUSE TO REDUCE COUMADIN TO 5MG  DAILY AND WILL RECHECK 07/06/12

## 2012-06-29 NOTE — Progress Notes (Signed)
Radiation Oncology         [phone] ________________________________  Initial outpatient Consultation  Name: Nicole [name] MRN: [id number]  Date: 06/29/2012  DOB: 05/21/1956  FA:[id number],IONGEXB, MD  Pierce Crane, MD   REFERRING PHYSICIAN: Pierce Crane, MD  DIAGNOSIS: The encounter diagnosis was Breast cancer, stage 4.  HISTORY OF PRESENT ILLNESS::Nicole [name] is a 56 y.o. female who is seen out of the courtesy of Dr. Pierce Crane for an opinion concerning radiation therapy as part of management of patient's advanced right breast cancer. Patient presented in 2011 with a large right breast mass. PET scan at that time showed hypermetabolic activity within the large right breast mass as well as right axillary/subpectoral lymph nodes. In addition there was hypermetabolic activity within the mediastinal and bilateral hilar areas. In addition there were hypermetabolic pulmonary parenchymal metastasis.  Patient was initially treated with tesaTaxel which she tolerated poorly with persistent thrombocytopenia. The patient was then switched to Femara and was on Femara for approximately 2 years with excellent clinical response.  For local regional control the patient proceeded to undergo right simple mastectomy and excision of the axillary mass.  The patient was noted to have a residual 3.2 cm mass with residual invasive ductal carcinoma, grade 1. There was dermal involvement with tumor but no evidence of dermal lymphatic invasion present. Total of 3 lymph nodes showed metastatic carcinoma from the axillary contents.  PREVIOUS RADIATION THERAPY: No  PAST MEDICAL HISTORY:  has a past medical history of IBD (inflammatory bowel disease); Heart murmur; Blood transfusion (2012); and Breast CA.    PAST SURGICAL HISTORY: Past Surgical History  Procedure Date  . Portacath placement 2011    left side  . Dilation and curettage of uterus 2004?  [name] Mastectomy 06/02/12    right breast    FAMILY HISTORY:  family history is negative for Cancer.  SOCIAL HISTORY:  reports that she has never smoked. She has never used smokeless tobacco. She reports that she does not drink alcohol or use illicit drugs.  ALLERGIES: Review of patient's allergies indicates no known allergies.  MEDICATIONS:  Current Outpatient Prescriptions  Medication Sig Dispense Refill  . alendronate (FOSAMAX) 70 MG tablet Take 70 mg by mouth every 7 (seven) days. On Wednesdays. Take with a full glass of water on an empty stomach.      . Calcium Carbonate (CALTRATE 600 PO) Take by mouth 2 (two) times daily.       . Cholecalciferol (VITAMIN D PO) Take 2,000 Units by mouth 2 (two) times daily.       [name] letrozole (FEMARA) 2.5 MG tablet Take 2.5 mg by mouth every evening.      . venlafaxine XR (EFFEXOR-XR) 75 MG 24 hr capsule Take 1 capsule (75 mg total) by mouth every evening.  90 capsule  0  . warfarin (COUMADIN) 5 MG tablet Take 1 tablet (5 mg total) by mouth daily.  30 tablet  3  . warfarin (COUMADIN) 7.5 MG tablet Take 1 tablet (7.5 mg total) by mouth daily.  30 tablet  1  . DISCONTD: warfarin (COUMADIN) 5 MG tablet Take 5 mg by mouth every other day.        REVIEW OF SYSTEMS:  A 15 point review of systems is documented in the electronic medical record. This was obtained by the nursing staff. However, I reviewed this with the patient to discuss relevant findings and make appropriate changes.  She has minimal discomfort in the right chest wall area.  She has not had to take any prescription pain medication. This did develop swelling in her left arm and was found to have a DVT requiring anticoagulation.   PHYSICAL EXAM:  height is 5\' 4"  (1.626 m) and weight is 142 lb 6.4 oz (64.592 kg). Her oral temperature is 97.8 F (36.6 C). Her blood pressure is 145/98 and her pulse is 93. Her respiration is 18.  the pupils are equal round and reactive to light. Extraocular eye movements are intact. The tongue is midline. There is no secondary  infection noted in the oral cavity or posterior pharynx. Patient's teeth are in excellent repair. The neck supraclavicular and axillary areas are free of adenopathy. The lungs are clear to auscultation. The heart has regular rhythm and rate. Examination of left breast reveals no mass or nipple discharge. Examination of the right chest wall area reveals a mastectomy scar which is healing well without signs of drainage or infection. The abdomen is soft and nontender with normal bowel sounds. There is no obvious hepatosplenomegaly. On neurological examination motor she is 5 out of 5 in the proximal distal muscle groups in the upper lower extremities. Peripheral pulses are good. The patient continues to have some swelling in her left arm.   LABORATORY DATA:  Lab Results  Component Value Date   WBC 6.0 06/16/2012   HGB 13.6 06/16/2012   HCT 38.7 06/16/2012   MCV 86.8 06/16/2012   PLT 108* 06/16/2012   Lab Results  Component Value Date   NA 139 05/28/2012   K 4.0 05/28/2012   CL 102 05/28/2012   CO2 27 05/28/2012   Lab Results  Component Value Date   ALT 27 04/22/2012   AST 21 04/22/2012   ALKPHOS 60 04/22/2012   BILITOT 0.7 04/22/2012     RADIOGRAPHY: Dg Chest 2 View  06/16/2012  *RADIOLOGY REPORT*  Clinical Data: Left arm swelling, pain.  CHEST - 2 VIEW  Comparison: Plain film 07/30/2010.  PET CT 02/12/2012  Findings: Left Port-A-Cath remains in stable position.  Lungs are clear.  Heart is normal size.  No effusions or acute bony abnormality.  Prior right mastectomy.  Surgical clips in the right axilla.  IMPRESSION: No active cardiopulmonary disease.  Original Report Authenticated By: Cyndie Chime, M.D.   Ct Head W Wo Contrast  06/22/2012  *RADIOLOGY REPORT*  Clinical Data:  Breast cancer with right mastectomy.  CT HEAD WITHOUT AND WITH CONTRAST CT CHEST, ABDOMEN AND PELVIS WITH CONTRAST  Technique:  Multidetector CT imaging of the head was performed following the standard protocol before and after  bolus injection of intravenous contrast. Multidetector CT imaging of the chest, abdomen and pelvis was performed following the standard protocol during bolus administration of intravenous contrast.  Contrast: OMNIPAQUE IOHEXOL 300 MG/ML  SOLN  Comparison:  PET CT from 02/12/2012.  Chest CT from 05/16/2011. Abdomen and pelvis CT from 05/16/2011.  Brain MRI from 05/13/2011.  CT HEAD  Findings: There is no evidence for acute hemorrhage, hydrocephalus, mass lesion, or abnormal extra-axial fluid collection.  No definite CT evidence for acute infarction.  Imaging after IV contrast administration shows no abnormal enhancement or evidence for enhancing mass lesion.  Chronic mucosal disease is seen in the left sphenoid sinus, scattered ethmoid air cells, and the left frontal sinus.  Mastoid air cells are clear bilaterally.  IMPRESSION: No evidence for metastatic disease in the brain.  CT CHEST  Findings: The patient is status post right mastectomy.  Surgical clips are seen  in the right axilla.  There is no axillary, mediastinal, or hilar lymphadenopathy.  The left Port-A-Cath tip projects in the distal SVC.  Heart size is normal.  There is no pericardial or pleural effusion.  Lung windows show no parenchymal mass.  Areas of nodularity in the lungs bilaterally have decreased when comparing to the CT of 05/16/2011. Appearance is stable since the most recent PET CT. There are some tiny ill-defined hazy opacities in the region of the nodules seen before, but no measurable nodules are seen at this time.  There is some stable chronic subsegmental atelectasis or scarring in the posterior right lower lobe.  Bone windows reveal no worrisome lytic or sclerotic osseous lesions.  IMPRESSION: Stable exam.  No evidence for new or progressive disease in the chest.  CT ABDOMEN AND PELVIS  Findings: Two well-defined low density lesions in the liver are stable and imaging features are most suggestive of hepatic cysts. Spleen is  unremarkable.  Stomach, duodenum, pancreas, gallbladder, and adrenal glands are unremarkable.  Prominent venous collateralization is seen posterior to the stomach.  Appearance is stable.  Portal vein and splenic vein appear patent as does the left renal vein.  These abnormal vessels may be related to vascular malformation or a shunt of unknown cause.  Cortical scarring is seen in the kidneys bilaterally.  No evidence for renal mass or hydronephrosis. Tiny right renal stones seen previously is no longer evident.  No abdominal aortic aneurysm.  There is no free fluid or lymphadenopathy in the abdomen.  The abdominal bowel loops are unremarkable.  Imaging through the pelvis shows no free intraperitoneal fluid.  No pelvic sidewall lymphadenopathy.  Bladder is unremarkable.  Uterus has normal imaging features.  There is no evidence for an adnexal mass.  No colonic diverticulitis the terminal ileum and the appendix are normal.  Gas locules are seen in the subcutaneous fat of the right anterior abdominal wall and left flank region, presumably representing sites of injection.  Bone windows reveal no worrisome lytic or sclerotic osseous lesions.  Superior endplate compression at L1 is stable.  IMPRESSION: Stable.  No evidence for metastatic disease in the abdomen or pelvis.  Original Report Authenticated By: ERIC A. MANSELL, M.D.   Ct Chest W Contrast  06/22/2012  *RADIOLOGY REPORT*  Clinical Data:  Breast cancer with right mastectomy.  CT HEAD WITHOUT AND WITH CONTRAST CT CHEST, ABDOMEN AND PELVIS WITH CONTRAST  Technique:  Multidetector CT imaging of the head was performed following the standard protocol before and after bolus injection of intravenous contrast. Multidetector CT imaging of the chest, abdomen and pelvis was performed following the standard protocol during bolus administration of intravenous contrast.  Contrast: OMNIPAQUE IOHEXOL 300 MG/ML  SOLN  Comparison:  PET CT from 02/12/2012.  Chest CT from  05/16/2011. Abdomen and pelvis CT from 05/16/2011.  Brain MRI from 05/13/2011.  CT HEAD  Findings: There is no evidence for acute hemorrhage, hydrocephalus, mass lesion, or abnormal extra-axial fluid collection.  No definite CT evidence for acute infarction.  Imaging after IV contrast administration shows no abnormal enhancement or evidence for enhancing mass lesion.  Chronic mucosal disease is seen in the left sphenoid sinus, scattered ethmoid air cells, and the left frontal sinus.  Mastoid air cells are clear bilaterally.  IMPRESSION: No evidence for metastatic disease in the brain.  CT CHEST  Findings: The patient is status post right mastectomy.  Surgical clips are seen in the right axilla.  There is no axillary, mediastinal,  or hilar lymphadenopathy.  The left Port-A-Cath tip projects in the distal SVC.  Heart size is normal.  There is no pericardial or pleural effusion.  Lung windows show no parenchymal mass.  Areas of nodularity in the lungs bilaterally have decreased when comparing to the CT of 05/16/2011. Appearance is stable since the most recent PET CT. There are some tiny ill-defined hazy opacities in the region of the nodules seen before, but no measurable nodules are seen at this time.  There is some stable chronic subsegmental atelectasis or scarring in the posterior right lower lobe.  Bone windows reveal no worrisome lytic or sclerotic osseous lesions.  IMPRESSION: Stable exam.  No evidence for new or progressive disease in the chest.  CT ABDOMEN AND PELVIS  Findings: Two well-defined low density lesions in the liver are stable and imaging features are most suggestive of hepatic cysts. Spleen is unremarkable.  Stomach, duodenum, pancreas, gallbladder, and adrenal glands are unremarkable.  Prominent venous collateralization is seen posterior to the stomach.  Appearance is stable.  Portal vein and splenic vein appear patent as does the left renal vein.  These abnormal vessels may be related to vascular  malformation or a shunt of unknown cause.  Cortical scarring is seen in the kidneys bilaterally.  No evidence for renal mass or hydronephrosis. Tiny right renal stones seen previously is no longer evident.  No abdominal aortic aneurysm.  There is no free fluid or lymphadenopathy in the abdomen.  The abdominal bowel loops are unremarkable.  Imaging through the pelvis shows no free intraperitoneal fluid.  No pelvic sidewall lymphadenopathy.  Bladder is unremarkable.  Uterus has normal imaging features.  There is no evidence for an adnexal mass.  No colonic diverticulitis the terminal ileum and the appendix are normal.  Gas locules are seen in the subcutaneous fat of the right anterior abdominal wall and left flank region, presumably representing sites of injection.  Bone windows reveal no worrisome lytic or sclerotic osseous lesions.  Superior endplate compression at L1 is stable.  IMPRESSION: Stable.  No evidence for metastatic disease in the abdomen or pelvis.  Original Report Authenticated By: ERIC A. MANSELL, M.D.   Ct Abdomen Pelvis W Contrast  06/22/2012  *RADIOLOGY REPORT*  Clinical Data:  Breast cancer with right mastectomy.  CT HEAD WITHOUT AND WITH CONTRAST CT CHEST, ABDOMEN AND PELVIS WITH CONTRAST  Technique:  Multidetector CT imaging of the head was performed following the standard protocol before and after bolus injection of intravenous contrast. Multidetector CT imaging of the chest, abdomen and pelvis was performed following the standard protocol during bolus administration of intravenous contrast.  Contrast: OMNIPAQUE IOHEXOL 300 MG/ML  SOLN  Comparison:  PET CT from 02/12/2012.  Chest CT from 05/16/2011. Abdomen and pelvis CT from 05/16/2011.  Brain MRI from 05/13/2011.  CT HEAD  Findings: There is no evidence for acute hemorrhage, hydrocephalus, mass lesion, or abnormal extra-axial fluid collection.  No definite CT evidence for acute infarction.  Imaging after IV contrast administration  shows no abnormal enhancement or evidence for enhancing mass lesion.  Chronic mucosal disease is seen in the left sphenoid sinus, scattered ethmoid air cells, and the left frontal sinus.  Mastoid air cells are clear bilaterally.  IMPRESSION: No evidence for metastatic disease in the brain.  CT CHEST  Findings: The patient is status post right mastectomy.  Surgical clips are seen in the right axilla.  There is no axillary, mediastinal, or hilar lymphadenopathy.  The left Port-A-Cath tip projects  in the distal SVC.  Heart size is normal.  There is no pericardial or pleural effusion.  Lung windows show no parenchymal mass.  Areas of nodularity in the lungs bilaterally have decreased when comparing to the CT of 05/16/2011. Appearance is stable since the most recent PET CT. There are some tiny ill-defined hazy opacities in the region of the nodules seen before, but no measurable nodules are seen at this time.  There is some stable chronic subsegmental atelectasis or scarring in the posterior right lower lobe.  Bone windows reveal no worrisome lytic or sclerotic osseous lesions.  IMPRESSION: Stable exam.  No evidence for new or progressive disease in the chest.  CT ABDOMEN AND PELVIS  Findings: Two well-defined low density lesions in the liver are stable and imaging features are most suggestive of hepatic cysts. Spleen is unremarkable.  Stomach, duodenum, pancreas, gallbladder, and adrenal glands are unremarkable.  Prominent venous collateralization is seen posterior to the stomach.  Appearance is stable.  Portal vein and splenic vein appear patent as does the left renal vein.  These abnormal vessels may be related to vascular malformation or a shunt of unknown cause.  Cortical scarring is seen in the kidneys bilaterally.  No evidence for renal mass or hydronephrosis. Tiny right renal stones seen previously is no longer evident.  No abdominal aortic aneurysm.  There is no free fluid or lymphadenopathy in the abdomen.  The  abdominal bowel loops are unremarkable.  Imaging through the pelvis shows no free intraperitoneal fluid.  No pelvic sidewall lymphadenopathy.  Bladder is unremarkable.  Uterus has normal imaging features.  There is no evidence for an adnexal mass.  No colonic diverticulitis the terminal ileum and the appendix are normal.  Gas locules are seen in the subcutaneous fat of the right anterior abdominal wall and left flank region, presumably representing sites of injection.  Bone windows reveal no worrisome lytic or sclerotic osseous lesions.  Superior endplate compression at L1 is stable.  IMPRESSION: Stable.  No evidence for metastatic disease in the abdomen or pelvis.  Original Report Authenticated By: ERIC A. MANSELL, M.D.      IMPRESSION: Stage IV invasive ductal carcinoma the right breast. As above the patient presented with  hilar mediastinal and pulmonary involvement.  She in addition presented with a large breast mass with pathology showing dermal involvement.  Given these issues the patient would be at risk for local regional recurrence and I would recommend a radiation therapy as part of her overall management. This patients most recent PET scan showed no active disease in the lung hilar or mediastinal and area or outside the chest region.   PLAN: Simulation and treatment planning after an appropriate time for healing from her most recent surgery.  I spent 60 minutes minutes face to face with the patient and more than 50% of that time was spent in counseling and/or coordination of care.   ------------------------------------------------   Billie Lade, PhD, MD

## 2012-06-30 ENCOUNTER — Telehealth: Payer: Self-pay | Admitting: *Deleted

## 2012-06-30 NOTE — Telephone Encounter (Signed)
spoke with patient informed patient that I will mail out her calendar on 06-29-2012 patient confirmed over the phone the times for labs only on 07-06-2012 and 07-13-2012

## 2012-07-06 ENCOUNTER — Other Ambulatory Visit: Payer: Self-pay | Admitting: Physician Assistant

## 2012-07-06 ENCOUNTER — Other Ambulatory Visit: Payer: Self-pay

## 2012-07-06 ENCOUNTER — Telehealth: Payer: Self-pay

## 2012-07-06 ENCOUNTER — Other Ambulatory Visit (HOSPITAL_BASED_OUTPATIENT_CLINIC_OR_DEPARTMENT_OTHER): Payer: BC Managed Care – PPO

## 2012-07-06 DIAGNOSIS — I82409 Acute embolism and thrombosis of unspecified deep veins of unspecified lower extremity: Secondary | ICD-10-CM

## 2012-07-06 DIAGNOSIS — C50919 Malignant neoplasm of unspecified site of unspecified female breast: Secondary | ICD-10-CM

## 2012-07-06 DIAGNOSIS — D696 Thrombocytopenia, unspecified: Secondary | ICD-10-CM

## 2012-07-06 LAB — PROTIME-INR

## 2012-07-06 LAB — PROTHROMBIN TIME: INR: 4.67 — ABNORMAL HIGH (ref <1.50)

## 2012-07-06 NOTE — Telephone Encounter (Signed)
Called pt and informed her per Debbora Presto, PA, she should hold coumadin x 2 days, then re-check labs on 7/10, and schedulers will call with appt.  Pt verbalizes understanding.  Onc tx schedule.

## 2012-07-07 ENCOUNTER — Telehealth: Payer: Self-pay | Admitting: Oncology

## 2012-07-07 NOTE — Telephone Encounter (Signed)
lmonvm adviisng the pt of her lab appt on 07/08/2012@1 :00pm

## 2012-07-08 ENCOUNTER — Other Ambulatory Visit: Payer: Self-pay | Admitting: *Deleted

## 2012-07-08 ENCOUNTER — Other Ambulatory Visit (HOSPITAL_BASED_OUTPATIENT_CLINIC_OR_DEPARTMENT_OTHER): Payer: BC Managed Care – PPO | Admitting: Lab

## 2012-07-08 DIAGNOSIS — I82629 Acute embolism and thrombosis of deep veins of unspecified upper extremity: Secondary | ICD-10-CM

## 2012-07-08 DIAGNOSIS — O2230 Deep phlebothrombosis in pregnancy, unspecified trimester: Secondary | ICD-10-CM

## 2012-07-08 LAB — PROTIME-INR: INR: 2.8 (ref 2.00–3.50)

## 2012-07-08 MED ORDER — WARFARIN SODIUM 2 MG PO TABS: 2.0000 mg | ORAL_TABLET | Freq: Every day | ORAL | Status: DC

## 2012-07-08 NOTE — Telephone Encounter (Signed)
PER MD, NOTIFIED PT TO TAKE 4MG  COUMADIN ALTERNATING WITH 5MG  , WILL RECHECK LABS 7/15

## 2012-07-13 ENCOUNTER — Ambulatory Visit
Admission: RE | Admit: 2012-07-13 | Discharge: 2012-07-13 | Disposition: A | Discharge status: Home / Self Care | Payer: BC Managed Care – PPO | Source: Ambulatory Visit | Attending: Radiation Oncology | Admitting: Radiation Oncology

## 2012-07-13 ENCOUNTER — Other Ambulatory Visit (HOSPITAL_BASED_OUTPATIENT_CLINIC_OR_DEPARTMENT_OTHER): Payer: BC Managed Care – PPO | Admitting: Lab

## 2012-07-13 ENCOUNTER — Other Ambulatory Visit: Payer: BC Managed Care – PPO | Admitting: Lab

## 2012-07-13 DIAGNOSIS — I82409 Acute embolism and thrombosis of unspecified deep veins of unspecified lower extremity: Secondary | ICD-10-CM

## 2012-07-13 DIAGNOSIS — C50919 Malignant neoplasm of unspecified site of unspecified female breast: Secondary | ICD-10-CM

## 2012-07-13 DIAGNOSIS — I82629 Acute embolism and thrombosis of deep veins of unspecified upper extremity: Secondary | ICD-10-CM

## 2012-07-13 NOTE — Progress Notes (Signed)
Met with patient to discuss RO billing.   Dx: 174.9 Female Breast, NOS (excludes skin of breast T-173.5)   Attending Rad: Dr. Marian Sorrow Tx:  [id number] Extrl Beam

## 2012-07-14 ENCOUNTER — Encounter: Payer: Self-pay | Admitting: Emergency Medicine

## 2012-07-14 NOTE — Progress Notes (Signed)
  Radiation Oncology         [phone]) [phone] ________________________________  Name: Nicole [name] MRN: [id number]  Date: 07/13/2012  DOB: May 28, 1956  SIMULATION AND TREATMENT PLANNING NOTE  DIAGNOSIS:  Stage IV right breast cancer  NARRATIVE:  The patient was brought to the CT Simulation planning suite.  Identity was confirmed.  All relevant records and images related to the planned course of therapy were reviewed.  The patient freely provided informed written consent to proceed with treatment after reviewing the details related to the planned course of therapy. The consent form was witnessed and verified by the simulation staff.  Then, the patient was set-up in a stable reproducible  supine position for radiation therapy.  CT images were obtained.  Surface markings were placed.  The CT images were loaded into the planning software.  Then the target and avoidance structures were contoured.  Treatment planning then occurred.  The radiation prescription was entered and confirmed.  A total of 5 complex treatment devices were fabricated. I have requested : Isodose Plan.   PLAN:  The patient will receive 59.4 Gy in 33 fractions.  ________________________________

## 2012-07-14 NOTE — Progress Notes (Signed)
Per MD notified patient to hold coumadin tonight 07/14/12, take 4mg  on 07/15/12 and 07/16/12 alternating with 5mg  on 07/17/12... Patient will recheck labs 7/22. Patient verbalized understanding.

## 2012-07-19 ENCOUNTER — Other Ambulatory Visit: Payer: Self-pay | Admitting: Oncology

## 2012-07-19 DIAGNOSIS — C50919 Malignant neoplasm of unspecified site of unspecified female breast: Secondary | ICD-10-CM

## 2012-07-20 ENCOUNTER — Ambulatory Visit
Admission: RE | Admit: 2012-07-20 | Discharge: 2012-07-20 | Disposition: A | Discharge status: Home / Self Care | Payer: BC Managed Care – PPO | Source: Ambulatory Visit | Attending: Radiation Oncology | Admitting: Radiation Oncology

## 2012-07-20 ENCOUNTER — Other Ambulatory Visit (HOSPITAL_BASED_OUTPATIENT_CLINIC_OR_DEPARTMENT_OTHER): Payer: BC Managed Care – PPO | Admitting: Lab

## 2012-07-20 ENCOUNTER — Telehealth: Payer: Self-pay | Admitting: Emergency Medicine

## 2012-07-20 DIAGNOSIS — C50919 Malignant neoplasm of unspecified site of unspecified female breast: Secondary | ICD-10-CM

## 2012-07-20 DIAGNOSIS — I82409 Acute embolism and thrombosis of unspecified deep veins of unspecified lower extremity: Secondary | ICD-10-CM

## 2012-07-20 LAB — PROTIME-INR: Protime: 46.8 Seconds — ABNORMAL HIGH (ref 10.6–13.4)

## 2012-07-20 NOTE — Telephone Encounter (Signed)
Spoke with patient regarding Coumadin dose. Patient instructed to take 4mg  daily per MD and have labs rechecked on 7/29. Patient verbalized understanding.

## 2012-07-20 NOTE — Progress Notes (Signed)
  Radiation Oncology         [phone]) [phone] ________________________________  Name: Nicole [name] MRN: [id number]  Date: 07/20/2012  DOB: [birth year]  Simulation Verification Note  Status: outpatient  NARRATIVE: The patient was brought to the treatment unit and placed in the planned treatment position. The clinical setup was verified. Then port films were obtained and uploaded to the radiation oncology medical record software.  The treatment beams were carefully compared against the planned radiation fields. The position location and shape of the radiation fields was reviewed. They targeted volume of tissue appears to be appropriately covered by the radiation beams. Organs at risk appear to be excluded as planned.  Based on my personal review, I approved the simulation verification. The patient's treatment will proceed as planned.  -----------------------------------  Billie Lade, PhD, MD

## 2012-07-21 ENCOUNTER — Other Ambulatory Visit: Payer: Self-pay | Admitting: Physician Assistant

## 2012-07-21 ENCOUNTER — Ambulatory Visit
Admission: RE | Admit: 2012-07-21 | Discharge: 2012-07-21 | Disposition: A | Discharge status: Home / Self Care | Payer: BC Managed Care – PPO | Source: Ambulatory Visit | Attending: Radiation Oncology | Admitting: Radiation Oncology

## 2012-07-22 ENCOUNTER — Encounter: Payer: Self-pay | Admitting: *Deleted

## 2012-07-22 ENCOUNTER — Ambulatory Visit
Admission: RE | Admit: 2012-07-22 | Discharge: 2012-07-22 | Disposition: A | Discharge status: Home / Self Care | Payer: BC Managed Care – PPO | Source: Ambulatory Visit | Attending: Radiation Oncology | Admitting: Radiation Oncology

## 2012-07-22 ENCOUNTER — Telehealth: Payer: Self-pay | Admitting: *Deleted

## 2012-07-22 NOTE — Telephone Encounter (Signed)
Late entry. Pt reported 07/21/12 that per her pharmacy,femara would not be filled until 07/24/12. A sample from infusion pharmacy has been left for pt pick up in Rad Onc. Pt has a 610 appt this afternoon for radiation

## 2012-07-23 ENCOUNTER — Ambulatory Visit
Admission: RE | Admit: 2012-07-23 | Discharge: 2012-07-23 | Disposition: A | Discharge status: Home / Self Care | Payer: BC Managed Care – PPO | Source: Ambulatory Visit | Attending: Radiation Oncology | Admitting: Radiation Oncology

## 2012-07-23 DIAGNOSIS — C50919 Malignant neoplasm of unspecified site of unspecified female breast: Secondary | ICD-10-CM

## 2012-07-23 MED ORDER — ALRA NON-METALLIC DEODORANT (RAD-ONC)
1.0000 "application " | Freq: Once | TOPICAL | Status: AC
  Administered 2012-07-23: 1 via TOPICAL

## 2012-07-23 MED ORDER — RADIAPLEXRX EX GEL
Freq: Once | CUTANEOUS | Status: AC
  Administered 2012-07-23: 1 via TOPICAL

## 2012-07-23 NOTE — Progress Notes (Signed)
POST SIM EDUCATION DONE WITH PATIENT AND HUSBAND.  BOTH VERBALIZE UNDERSTANDING AND ARE ABLE TO REPEAT INFORMATION BACK TO ME.  GAVE BOOKLET, RADIATION AND YOU, WITH PERTINENT PAGES MARKED.  SHE ASKED APPROPRIATE QUESTIONS.  GAVE HER RADIAPLEX AND ALRA DEODORANT WITH INSTRUCTIONS FOR USE.  WE ALSO TALKED ABOUT HER BLOOD CLOT THAT SHE HAD IN HER LEFT NECK AFTER SURGERY AND THE FACT THAT IS ON COUMADIN NOW FOR THAT

## 2012-07-24 ENCOUNTER — Ambulatory Visit
Admission: RE | Admit: 2012-07-24 | Discharge: 2012-07-24 | Disposition: A | Discharge status: Home / Self Care | Payer: BC Managed Care – PPO | Source: Ambulatory Visit | Attending: Radiation Oncology | Admitting: Radiation Oncology

## 2012-07-27 ENCOUNTER — Telehealth: Payer: Self-pay | Admitting: *Deleted

## 2012-07-27 ENCOUNTER — Ambulatory Visit
Admission: RE | Admit: 2012-07-27 | Discharge: 2012-07-27 | Disposition: A | Discharge status: Home / Self Care | Payer: BC Managed Care – PPO | Source: Ambulatory Visit | Attending: Radiation Oncology | Admitting: Radiation Oncology

## 2012-07-27 ENCOUNTER — Encounter: Payer: Self-pay | Admitting: *Deleted

## 2012-07-27 ENCOUNTER — Other Ambulatory Visit (HOSPITAL_BASED_OUTPATIENT_CLINIC_OR_DEPARTMENT_OTHER): Payer: BC Managed Care – PPO | Admitting: Lab

## 2012-07-27 DIAGNOSIS — I82409 Acute embolism and thrombosis of unspecified deep veins of unspecified lower extremity: Secondary | ICD-10-CM

## 2012-07-27 DIAGNOSIS — Z7901 Long term (current) use of anticoagulants: Secondary | ICD-10-CM

## 2012-07-27 DIAGNOSIS — Z5181 Encounter for therapeutic drug level monitoring: Secondary | ICD-10-CM

## 2012-07-27 LAB — PROTIME-INR
INR: 4.6 — ABNORMAL HIGH (ref 2.00–3.50)
Protime: 55.2 Seconds — ABNORMAL HIGH (ref 10.6–13.4)

## 2012-07-27 NOTE — Telephone Encounter (Signed)
Per mD, have notified pt to alternate coumadin 4mg  with 3mg  and will recheck labs 08/03/12

## 2012-07-28 ENCOUNTER — Ambulatory Visit
Admission: RE | Admit: 2012-07-28 | Discharge: 2012-07-28 | Disposition: A | Discharge status: Home / Self Care | Payer: BC Managed Care – PPO | Source: Ambulatory Visit | Attending: Radiation Oncology | Admitting: Radiation Oncology

## 2012-07-28 DIAGNOSIS — C50919 Malignant neoplasm of unspecified site of unspecified female breast: Secondary | ICD-10-CM

## 2012-07-28 NOTE — Progress Notes (Signed)
Weekly Management Note Current Dose:  9.0 Gy  Projected Dose: 59.4 Gy   Narrative:  The patient presents for routine under treatment assessment.  CBCT/MVCT images/Port film x-rays were reviewed.  The chart was checked.  She is tolerating treatments well without side effects this time. She does have some mild fatigue but had this prior to starting her radiation therapy  Physical Findings: Weight:  . The lungs are clear. The heart has a regular rhythm and rate. The right chest wall area shows some very slight erythema and hyperpigmentation changes.  Impression:  The patient is tolerating radiation.  Plan:  Continue treatment as planned.   -----------------------------------  Billie Lade, PhD, MD

## 2012-07-28 NOTE — Progress Notes (Signed)
HERE TODAY FOR PUT OF RIGHT BREAST.  NO C/O TODAY.  USING RADIAPLEX FOR PREVENTION.

## 2012-07-29 ENCOUNTER — Ambulatory Visit
Admission: RE | Admit: 2012-07-29 | Discharge: 2012-07-29 | Disposition: A | Discharge status: Home / Self Care | Payer: BC Managed Care – PPO | Source: Ambulatory Visit | Attending: Radiation Oncology | Admitting: Radiation Oncology

## 2012-07-30 ENCOUNTER — Ambulatory Visit
Admission: RE | Admit: 2012-07-30 | Discharge: 2012-07-30 | Disposition: A | Discharge status: Home / Self Care | Payer: BC Managed Care – PPO | Source: Ambulatory Visit | Attending: Radiation Oncology | Admitting: Radiation Oncology

## 2012-07-31 ENCOUNTER — Ambulatory Visit
Admission: RE | Admit: 2012-07-31 | Discharge: 2012-07-31 | Disposition: A | Discharge status: Home / Self Care | Payer: BC Managed Care – PPO | Source: Ambulatory Visit | Attending: Radiation Oncology | Admitting: Radiation Oncology

## 2012-08-03 ENCOUNTER — Ambulatory Visit
Admission: RE | Admit: 2012-08-03 | Discharge: 2012-08-03 | Disposition: A | Discharge status: Home / Self Care | Payer: BC Managed Care – PPO | Source: Ambulatory Visit | Attending: Radiation Oncology | Admitting: Radiation Oncology

## 2012-08-03 ENCOUNTER — Other Ambulatory Visit (HOSPITAL_BASED_OUTPATIENT_CLINIC_OR_DEPARTMENT_OTHER): Payer: BC Managed Care – PPO | Admitting: Lab

## 2012-08-03 DIAGNOSIS — C50919 Malignant neoplasm of unspecified site of unspecified female breast: Secondary | ICD-10-CM

## 2012-08-03 DIAGNOSIS — I82409 Acute embolism and thrombosis of unspecified deep veins of unspecified lower extremity: Secondary | ICD-10-CM

## 2012-08-03 LAB — PROTIME-INR
INR: 3.9 — ABNORMAL HIGH (ref 2.00–3.50)
Protime: 46.8 Seconds — ABNORMAL HIGH (ref 10.6–13.4)

## 2012-08-03 NOTE — Progress Notes (Signed)
HERE TODAY FOR PUT OF RIGHT CHEST/ BREAST.  SKIN LIGHT RED, NO RADIATION DERMATITIS NOTED.  USING RADIAPLEX.  NO OTHER C/O

## 2012-08-03 NOTE — Progress Notes (Signed)
   Weekly Management Note Current Dose:  18 Gy  Projected Dose:59.4  Gy   Narrative:  The patient presents for routine under treatment assessment.  CBCT/MVCT images/Port film x-rays were reviewed.  The chart was checked. Doing well with no complaints. Using radiaplex  Physical Findings: Weight is 142.4 pounds. She sitting comfortably in a chair in no acute distress. She demonstrates early erythema in the treatment fields of her right chest wall/supraclavicular region  Impression:  The patient is tolerating radiotherapy.  Plan:  Continue radiotherapy as planned.  ________________________________   Lonie Peak, M.D.

## 2012-08-04 ENCOUNTER — Encounter: Payer: Self-pay | Admitting: *Deleted

## 2012-08-04 ENCOUNTER — Ambulatory Visit
Admission: RE | Admit: 2012-08-04 | Discharge: 2012-08-04 | Disposition: A | Discharge status: Home / Self Care | Payer: BC Managed Care – PPO | Source: Ambulatory Visit | Attending: Radiation Oncology | Admitting: Radiation Oncology

## 2012-08-05 ENCOUNTER — Ambulatory Visit
Admission: RE | Admit: 2012-08-05 | Discharge: 2012-08-05 | Disposition: A | Discharge status: Home / Self Care | Payer: BC Managed Care – PPO | Source: Ambulatory Visit | Attending: Radiation Oncology | Admitting: Radiation Oncology

## 2012-08-05 DIAGNOSIS — C50919 Malignant neoplasm of unspecified site of unspecified female breast: Secondary | ICD-10-CM

## 2012-08-05 NOTE — Progress Notes (Signed)
   Weekly Management Note; right chest wall and lymph nodes Current Dose:  21.6 Gy  Projected Dose: 59.4 Gy   Narrative:  The patient presents for routine under treatment assessment.  CBCT/MVCT images/Port film x-rays were reviewed.  The chart was checked. The patient's has been tolerating radiotherapy well. Her new complaint since I saw her earlier this week is that she has noted some increased edema in her left arm. She also has some soreness under her left shoulder . She was found to have a clot in the left jugular vein relatively recently and is on Coumadin for this. The color in her left arm, which was initially "off" has come back since anticoagulation.  Physical Findings: Weight is 142.5 pounds. She is in no acute distress. Right chest wall is erythematous. Skin is intact. Her left arm is slightly edematous compared to the right.  Impression:  The patient is tolerating radiotherapy.  Plan:  Continue radiotherapy as planned. We will continue anticoagulation. I don't think any further studies are necessary at this point in time. Howeverwe will keep an eye on her symptoms and  I will cc this to Dr Donnie Coffin,  Dr. Dwain Sarna and her radiation oncologist (Dr. Roselind Messier)   who is on vacation currently so they are aware.  Of note, the patient reports she has been wearing an underwire bra. She feels it is rubbing against her skin / right chest wall. I told her to switch to camisoles / sports bras. She will do this.  ________________________________   Lonie Peak, M.D.

## 2012-08-05 NOTE — Progress Notes (Signed)
HERE TODAY FOR PUT OF RIGHT BREAST.  SKIN LOOKS GOOD BUT DOES HAVE A SMALL AMT OF WHAT APPEARS TO RADIAITON DERMATITIS ALTHOUGH SHE SAYS IT DOESN'T ITCH.  SHE IS USING THE RADIAPLEX GEL.  HAVING WHAT SHE DESCRIBES AS MUSCLE PAIN IN LEFT ARM.  HAS A HX OF "BLOOD CLOT" IN LEFT NECK AND ARM.  ARM AND NECK DO NOT APPEAR DISCOLORED OR EDEMATOUS.  PLEASE CHECK THIS SINCE SHE IS CONCERNED

## 2012-08-06 ENCOUNTER — Ambulatory Visit
Admission: RE | Admit: 2012-08-06 | Discharge: 2012-08-06 | Disposition: A | Discharge status: Home / Self Care | Payer: BC Managed Care – PPO | Source: Ambulatory Visit | Attending: Radiation Oncology | Admitting: Radiation Oncology

## 2012-08-07 ENCOUNTER — Ambulatory Visit
Admission: RE | Admit: 2012-08-07 | Discharge: 2012-08-07 | Disposition: A | Discharge status: Home / Self Care | Payer: BC Managed Care – PPO | Source: Ambulatory Visit | Attending: Radiation Oncology | Admitting: Radiation Oncology

## 2012-08-10 ENCOUNTER — Ambulatory Visit
Admission: RE | Admit: 2012-08-10 | Discharge: 2012-08-10 | Disposition: A | Discharge status: Home / Self Care | Payer: BC Managed Care – PPO | Source: Ambulatory Visit | Attending: Radiation Oncology | Admitting: Radiation Oncology

## 2012-08-10 ENCOUNTER — Other Ambulatory Visit (HOSPITAL_BASED_OUTPATIENT_CLINIC_OR_DEPARTMENT_OTHER): Payer: BC Managed Care – PPO | Admitting: Lab

## 2012-08-10 ENCOUNTER — Telehealth: Payer: Self-pay | Admitting: Emergency Medicine

## 2012-08-10 DIAGNOSIS — I82409 Acute embolism and thrombosis of unspecified deep veins of unspecified lower extremity: Secondary | ICD-10-CM

## 2012-08-10 DIAGNOSIS — C50919 Malignant neoplasm of unspecified site of unspecified female breast: Secondary | ICD-10-CM

## 2012-08-10 LAB — PROTIME-INR

## 2012-08-10 NOTE — Telephone Encounter (Signed)
Per MD, patient to continue taking 3mg /4mg  alternating Coumadin and return on 8/23 for labs.

## 2012-08-11 ENCOUNTER — Ambulatory Visit
Admission: RE | Admit: 2012-08-11 | Discharge: 2012-08-11 | Disposition: A | Discharge status: Home / Self Care | Payer: BC Managed Care – PPO | Source: Ambulatory Visit | Attending: Radiation Oncology | Admitting: Radiation Oncology

## 2012-08-11 ENCOUNTER — Encounter: Payer: Self-pay | Admitting: Radiation Oncology

## 2012-08-11 VITALS — BP 167/96 | HR 98 | Temp 98.3°F | Resp 20 | Wt 144.8 lb

## 2012-08-11 DIAGNOSIS — C50919 Malignant neoplasm of unspecified site of unspecified female breast: Secondary | ICD-10-CM

## 2012-08-11 MED ORDER — BIAFINE EX EMUL
CUTANEOUS | Status: DC | PRN
  Administered 2012-08-11: 15:00:00 via TOPICAL

## 2012-08-11 NOTE — Progress Notes (Signed)
Patient alert,oriented x3, here for rad tx right chest wall,completed 16/33 txs so far, has some dermatitis top of chest wall right side, slight erythema, skin intact, but does c/o itching there, no c/o pain, but occasional zings in area, eating well,drinking enough water,and drinks gator aid as well 2:26 PM

## 2012-08-11 NOTE — Progress Notes (Signed)
[facility]    Radiation Oncology [address] [place]     Maryln Gottron, M.D. [place], [place] [address]               Billie Lade, M.D., Ph.D. Phone: [phone]      Molli Hazard A. Kathrynn Running, M.D. Fax: [phone]      Radene Gunning, M.D., Ph.D.         Lurline Hare, M.D.         Grayland Jack, M.D Weekly Treatment Management Note  Name: Nicole [name]     MRN: [id number]        CSN: [id number] Date: 08/11/2012      DOB: 04/27/1956  CC: [name], MD         Dwain Sarna    Status: Outpatient  Diagnosis: The encounter diagnosis was Breast cancer, stage 4.  Current Dose: 28.8 Gy  Current Fraction: 16  Planned Dose: 59.4 Gy  Narrative: [name] was seen today for weekly treatment management. The chart was checked and port films  were reviewed.  She is having some itching at in the upper chest region. Patient denies the pain with swallowing or difficulty with swallowing. She denies any breathing problems or significant fatigue at this time.    Review of patient's allergies indicates no known allergies. Current Outpatient Prescriptions  Medication Sig Dispense Refill  . alendronate (FOSAMAX) 70 MG tablet Take 70 mg by mouth every 7 (seven) days. On Wednesdays. Take with a full glass of water on an empty stomach.      [name] alendronate (FOSAMAX) 70 MG tablet TAKE 1 TABLET ONCE A WEEK  12 tablet  0  . Calcium Carbonate (CALTRATE 600 PO) Take by mouth 2 (two) times daily.       . Cholecalciferol (VITAMIN D PO) Take 2,000 Units by mouth 2 (two) times daily.       [name] emollient (BIAFINE) cream Apply 1 application topically 2 (two) times daily.      [name] letrozole (FEMARA) 2.5 MG tablet Take 2.5 mg by mouth every evening.      [name] letrozole (FEMARA) 2.5 MG tablet TAKE 1 TABLET BY MOUTH EVERY DAY  30 tablet  3  . venlafaxine XR (EFFEXOR-XR) 75 MG 24 hr capsule Take 1 capsule (75 mg total) by mouth every evening.  90 capsule  0  . warfarin (COUMADIN) 2 MG tablet Take  1 tablet (2 mg total) by mouth daily. Take 2 2mg  tabs (total 4mg ) alternating with 5mg   60 tablet  0  . warfarin (COUMADIN) 5 MG tablet Take 1 tablet (5 mg total) by mouth daily.  30 tablet  3  . warfarin (COUMADIN) 7.5 MG tablet Take 1 tablet (7.5 mg total) by mouth daily.  30 tablet  1   Current Facility-Administered Medications  Medication Dose Route Frequency Provider Last Rate Last Dose  . topical emolient (BIAFINE) emulsion   Topical PRN Billie Lade, MD       Labs:  Lab Results  Component Value Date   WBC 6.0 06/16/2012   HGB 13.6 06/16/2012   HCT 38.7 06/16/2012   MCV 86.8 06/16/2012   PLT 108* 06/16/2012   Lab Results  Component Value Date   CREATININE 0.99 05/28/2012   BUN 11 05/28/2012   NA 139 05/28/2012   K 4.0 05/28/2012   CL 102 05/28/2012   CO2 27 05/28/2012   Lab Results  Component Value Date   ALT 27 04/22/2012  AST 21 04/22/2012   BILITOT 0.7 04/22/2012    Physical Examination:  weight is 144 lb 12.8 oz (65.681 kg). Her oral temperature is 98.3 F (36.8 C). Her blood pressure is 167/96 and her pulse is 98. Her respiration is 20.    Wt Readings from Last 3 Encounters:  08/11/12 144 lb 12.8 oz (65.681 kg)  08/05/12 142 lb 8 oz (64.638 kg)  08/03/12 142 lb 6.4 oz (64.592 kg)     Lungs - Normal respiratory effort, chest expands symmetrically. Lungs are clear to auscultation, no crackles or wheezes.   The right chest wall area shows significant radiation dermatitis in the upper inner region.  There is no moist desquamation noted. Heart has regular rhythm and rate  Abdomen is soft and non tender with normal bowel sounds  Assessment:  Patient tolerating treatments well.  In light of the patient's skin reaction she will be switched to Biafine.  Plan: Continue treatment per original radiation prescription

## 2012-08-12 ENCOUNTER — Other Ambulatory Visit: Payer: Self-pay | Admitting: Oncology

## 2012-08-12 ENCOUNTER — Ambulatory Visit
Admission: RE | Admit: 2012-08-12 | Discharge: 2012-08-12 | Disposition: A | Discharge status: Home / Self Care | Payer: BC Managed Care – PPO | Source: Ambulatory Visit | Attending: Radiation Oncology | Admitting: Radiation Oncology

## 2012-08-13 ENCOUNTER — Ambulatory Visit
Admission: RE | Admit: 2012-08-13 | Discharge: 2012-08-13 | Disposition: A | Discharge status: Home / Self Care | Payer: BC Managed Care – PPO | Source: Ambulatory Visit | Attending: Radiation Oncology | Admitting: Radiation Oncology

## 2012-08-14 ENCOUNTER — Ambulatory Visit
Admission: RE | Admit: 2012-08-14 | Discharge: 2012-08-14 | Disposition: A | Discharge status: Home / Self Care | Payer: BC Managed Care – PPO | Source: Ambulatory Visit | Attending: Radiation Oncology | Admitting: Radiation Oncology

## 2012-08-17 ENCOUNTER — Encounter: Payer: Self-pay | Admitting: Radiation Oncology

## 2012-08-17 ENCOUNTER — Ambulatory Visit
Admission: RE | Admit: 2012-08-17 | Discharge: 2012-08-17 | Disposition: A | Discharge status: Home / Self Care | Payer: BC Managed Care – PPO | Source: Ambulatory Visit | Attending: Radiation Oncology | Admitting: Radiation Oncology

## 2012-08-17 VITALS — BP 138/91 | HR 89 | Resp 18 | Wt 142.8 lb

## 2012-08-17 DIAGNOSIS — C50919 Malignant neoplasm of unspecified site of unspecified female breast: Secondary | ICD-10-CM

## 2012-08-17 NOTE — Progress Notes (Signed)
   Department of Radiation Oncology  Phone:  [phone] Fax:        [phone]   Electron beam simulation note:   Earlier today the patient's treatment planning CT scan was reviewed and she had set up with a custom electron cutout field directed at the mastectomy scar and local regional area. She will be treated with 6 MeV electrons prescribed to the 90% isodose line. A 0.5 cm bolus will be placed daily during the patient's treatment. Patient is scheduled to receive 8 additional treatments at 180 square per day for an additional dose of 1440 cGy. A special port plan is requested for treatment.  -----------------------------------  Billie Lade, PhD, MD

## 2012-08-17 NOTE — Progress Notes (Signed)
   Department of Radiation Oncology  Phone:  [phone] Fax:        [phone]   Weekly Management Note Current Dose: 36.0  Gy  Projected Dose: 59.4 Gy   Narrative:  The patient presents for routine under treatment assessment.  CBCT/MVCT images/Port film x-rays were reviewed.  The chart was checked.  She is having a fair amount of itching within the treatment area. She is wasting Biafine on her skin. Patient is able to sleep at night.  Physical Findings: Weight: 142 lb 12.8 oz (64.774 kg). The lungs are clear. The heart has a regular rhythm and rate. Examination right chest wall area reveals diffuse erythema most significant in the upper inner aspect of the chest. There is no moist desquamation at this point.  Impression:  The patient is tolerating radiation.  Plan:  Continue treatment as planned.   Billie Lade, PhD, MD

## 2012-08-17 NOTE — Progress Notes (Signed)
Patient presents to the clinic today accompanied by her husband for an under treat visit with Dr. Roselind Messier. Patient is alert and oriented to person, place, and time. No distress noted. Steady gait noted. Pleasant affect noted. Patient denies pain at this time. Hyperpigmentation without desquamation noted. Patient reports itching on the chest wall. Dermatitis noted on right chest wall. Patient reports using Biafine as directed on treatment field. Reported all findings to Dr. Roselind Messier.

## 2012-08-18 ENCOUNTER — Ambulatory Visit
Admission: RE | Admit: 2012-08-18 | Discharge: 2012-08-18 | Disposition: A | Discharge status: Home / Self Care | Payer: BC Managed Care – PPO | Source: Ambulatory Visit | Attending: Radiation Oncology | Admitting: Radiation Oncology

## 2012-08-19 ENCOUNTER — Ambulatory Visit
Admission: RE | Admit: 2012-08-19 | Discharge: 2012-08-19 | Disposition: A | Discharge status: Home / Self Care | Payer: BC Managed Care – PPO | Source: Ambulatory Visit | Attending: Radiation Oncology | Admitting: Radiation Oncology

## 2012-08-20 ENCOUNTER — Ambulatory Visit
Admission: RE | Admit: 2012-08-20 | Discharge: 2012-08-20 | Disposition: A | Discharge status: Home / Self Care | Payer: BC Managed Care – PPO | Source: Ambulatory Visit | Attending: Radiation Oncology | Admitting: Radiation Oncology

## 2012-08-21 ENCOUNTER — Other Ambulatory Visit (HOSPITAL_BASED_OUTPATIENT_CLINIC_OR_DEPARTMENT_OTHER): Payer: BC Managed Care – PPO | Admitting: Lab

## 2012-08-21 ENCOUNTER — Ambulatory Visit
Admission: RE | Admit: 2012-08-21 | Discharge: 2012-08-21 | Disposition: A | Discharge status: Home / Self Care | Payer: BC Managed Care – PPO | Source: Ambulatory Visit | Attending: Radiation Oncology | Admitting: Radiation Oncology

## 2012-08-21 ENCOUNTER — Telehealth: Payer: Self-pay | Admitting: *Deleted

## 2012-08-21 ENCOUNTER — Ambulatory Visit (HOSPITAL_BASED_OUTPATIENT_CLINIC_OR_DEPARTMENT_OTHER): Payer: BC Managed Care – PPO | Admitting: Oncology

## 2012-08-21 VITALS — BP 149/89 | HR 94 | Temp 98.8°F | Resp 20 | Ht 64.0 in | Wt 143.9 lb

## 2012-08-21 DIAGNOSIS — I82409 Acute embolism and thrombosis of unspecified deep veins of unspecified lower extremity: Secondary | ICD-10-CM

## 2012-08-21 DIAGNOSIS — C50419 Malignant neoplasm of upper-outer quadrant of unspecified female breast: Secondary | ICD-10-CM

## 2012-08-21 DIAGNOSIS — Z170 Estrogen receptor positive status [ER+]: Secondary | ICD-10-CM

## 2012-08-21 DIAGNOSIS — C50919 Malignant neoplasm of unspecified site of unspecified female breast: Secondary | ICD-10-CM

## 2012-08-21 DIAGNOSIS — I82629 Acute embolism and thrombosis of deep veins of unspecified upper extremity: Secondary | ICD-10-CM

## 2012-08-21 LAB — CBC WITH DIFFERENTIAL/PLATELET
Basophils Absolute: 0 10*3/uL (ref 0.0–0.1)
Eosinophils Absolute: 0 10*3/uL (ref 0.0–0.5)
HGB: 14.3 g/dL (ref 11.6–15.9)
MCV: 88.2 fL (ref 79.5–101.0)
MONO#: 0.5 10*3/uL (ref 0.1–0.9)
MONO%: 10 % (ref 0.0–14.0)
NEUT#: 3.7 10*3/uL (ref 1.5–6.5)
RBC: 4.8 10*6/uL (ref 3.70–5.45)
RDW: 14.1 % (ref 11.2–14.5)
WBC: 5 10*3/uL (ref 3.9–10.3)

## 2012-08-21 LAB — PROTHROMBIN TIME: Prothrombin Time: 31.5 seconds — ABNORMAL HIGH (ref 11.6–15.2)

## 2012-08-21 LAB — COMPREHENSIVE METABOLIC PANEL
Albumin: 4.3 g/dL (ref 3.5–5.2)
Alkaline Phosphatase: 59 U/L (ref 39–117)
BUN: 15 mg/dL (ref 6–23)
CO2: 24 mEq/L (ref 19–32)
Calcium: 9.2 mg/dL (ref 8.4–10.5)
Chloride: 105 mEq/L (ref 96–112)
Glucose, Bld: 78 mg/dL (ref 70–99)
Potassium: 3.9 mEq/L (ref 3.5–5.3)
Sodium: 140 mEq/L (ref 135–145)
Total Protein: 6.8 g/dL (ref 6.0–8.3)

## 2012-08-21 LAB — LACTATE DEHYDROGENASE: LDH: 195 U/L (ref 94–250)

## 2012-08-21 LAB — CANCER ANTIGEN 27.29: CA 27.29: 18 U/mL (ref 0–39)

## 2012-08-21 NOTE — Telephone Encounter (Signed)
Per MD, notified pt to continue present dose of coumadin 3mg  alternating with 4mg //will recheck 08/24/12

## 2012-08-23 NOTE — Progress Notes (Signed)
Hematology and Oncology Follow Up Visit  Nicole [name] [id number] June 16, 1956 56 y.o. 08/23/2012 8:22 PM   Principle Diagnosis: Locally advanced breast cancer with metastases currently on Femara. She was  diagnosed July 2011. Initial trial with tesataxel, complicated by persistent thrombocytopenia. She has been on Femara for close to 2 years with excellent clinical response. Most recent PET scan performed in February of this year showed minimal if any uptake seen.  Interim History: Nicole [name] returns for followup she is on radiation therapy and doing pretty well she does have some skin reactions. She is about to 3 weeks left. She continues on Coumadin for an occluded IJ.  Medications: I have reviewed the patient's current medications.  Allergies: No Known Allergies  Past Medical History, Surgical history, Social history, and Family History were reviewed and updated.  Review of Systems: Constitutional:  Negative for fever, chills, night sweats, anorexia, weight loss, pain. Cardiovascular: no chest pain or dyspnea on exertion Respiratory: no cough, shortness of breath, or wheezing Neurological: no TIA or stroke symptoms Dermatological: negative ENT: negative Skin Gastrointestinal: no abdominal pain, change in bowel habits, or black or bloody stools Genito-Urinary: no dysuria, trouble voiding, or hematuria Hematological and Lymphatic: negative Breast: . Musculoskeletal: positive for - joint pain Remaining ROS negative.  Physical Exam: Blood pressure 149/89, pulse 94, temperature 98.8 F (37.1 C), temperature source Oral, resp. rate 20, height 5\' 4"  (1.626 m), weight 143 lb 14.4 oz (65.273 kg). ECOG: 0 General appearance: alert, cooperative and appears stated age Head: Normocephalic, without obvious abnormality, atraumatic Neck: no adenopathy, no carotid bruit, no JVD, supple, symmetrical, trachea midline and thyroid not enlarged, symmetric, no tenderness/mass/nodules Lymph nodes:  Cervical, supraclavicular, and axillary nodes normal. Cardiac : normal Pulmonary:normal Breasts: s/p rt mastectomy., There is some brisk radiation type response is seen in the chest wall. Her exam is otherwise negative. Abdomen: Normal Extremities lt arm swelling  Neuro:  Lab Results: Lab Results  Component Value Date   WBC 5.0 08/21/2012   HGB 14.3 08/21/2012   HCT 42.3 08/21/2012   MCV 88.2 08/21/2012   PLT 103* 08/21/2012     Chemistry      Component Value Date/Time   NA 140 08/21/2012 1307   K 3.9 08/21/2012 1307   CL 105 08/21/2012 1307   CO2 24 08/21/2012 1307   BUN 15 08/21/2012 1307   CREATININE 0.93 08/21/2012 1307      Component Value Date/Time   CALCIUM 9.2 08/21/2012 1307   ALKPHOS 59 08/21/2012 1307   AST 24 08/21/2012 1307   ALT 30 08/21/2012 1307   BILITOT 0.6 08/21/2012 1307       Radiological Studies: chest X-ray n/a n/a Mammogram n/a Bone density n/a  Impression and Plan: Residual er+ N+ breast cancer s/p mrm, with excellent response to Femara. I will see her again in followup after completion of radiation. She will continue Femara. She will also be monitored with weekly INR.  inr is 2.99.  Pierce Crane M.D. [facility]  More than 50% of the visit was spent in patient-related counselling   Pierce Crane, MD 8/25/20138:22 PM

## 2012-08-24 ENCOUNTER — Telehealth: Payer: Self-pay | Admitting: Emergency Medicine

## 2012-08-24 ENCOUNTER — Telehealth: Payer: Self-pay | Admitting: Oncology

## 2012-08-24 ENCOUNTER — Ambulatory Visit
Admission: RE | Admit: 2012-08-24 | Discharge: 2012-08-24 | Disposition: A | Discharge status: Home / Self Care | Payer: BC Managed Care – PPO | Source: Ambulatory Visit | Attending: Radiation Oncology | Admitting: Radiation Oncology

## 2012-08-24 ENCOUNTER — Other Ambulatory Visit (HOSPITAL_BASED_OUTPATIENT_CLINIC_OR_DEPARTMENT_OTHER): Payer: BC Managed Care – PPO | Admitting: Lab

## 2012-08-24 DIAGNOSIS — O2230 Deep phlebothrombosis in pregnancy, unspecified trimester: Secondary | ICD-10-CM

## 2012-08-24 DIAGNOSIS — I82409 Acute embolism and thrombosis of unspecified deep veins of unspecified lower extremity: Secondary | ICD-10-CM

## 2012-08-24 DIAGNOSIS — C50919 Malignant neoplasm of unspecified site of unspecified female breast: Secondary | ICD-10-CM

## 2012-08-24 NOTE — Telephone Encounter (Signed)
S/w the pt and she is aware to pick up her schedule the next time she is in the bldg

## 2012-08-24 NOTE — Progress Notes (Signed)
[facility]    Radiation Oncology [address] [place]     Maryln Gottron, M.D. [place], [place] [address]               Billie Lade, M.D., Ph.D. Phone: [phone]      Molli Hazard A. Kathrynn Running, M.D. Fax: [phone]      Radene Gunning, M.D., Ph.D.         Lurline Hare, M.D.         Grayland Jack, M.D Weekly Treatment Management Note  Name: Nicole [name]     MRN: [id number]        CSN: [id number] Date: 08/24/2012      DOB: Sep 16, 1956  CC: [name], MD         Dwain Sarna    Status: Outpatient  Diagnosis: The encounter diagnosis was Breast cancer, stage 4.  Current Dose: 45.0 Gy  Current Fraction: 25  Planned Dose: 59.4 Gy  Narrative: [name] was seen today for weekly treatment management. The chart was checked and port films  were reviewed. The patient's electron setup was checked on the linear accelerator today. The position and angle is accurate for treatment. She is having more fatigue and discomfort along the chest wall area this time.   Review of patient's allergies indicates no known allergies.   Current Outpatient Prescriptions  Medication Sig Dispense Refill  . alendronate (FOSAMAX) 70 MG tablet Take 70 mg by mouth every 7 (seven) days. On Wednesdays. Take with a full glass of water on an empty stomach.      [name] alendronate (FOSAMAX) 70 MG tablet TAKE 1 TABLET ONCE A WEEK  12 tablet  0  . Calcium Carbonate (CALTRATE 600 PO) Take by mouth 2 (two) times daily.       . Cholecalciferol (VITAMIN D PO) Take 2,000 Units by mouth 2 (two) times daily.       [name] emollient (BIAFINE) cream Apply 1 application topically 2 (two) times daily.      [name] letrozole (FEMARA) 2.5 MG tablet Take 2.5 mg by mouth every evening.      [name] letrozole (FEMARA) 2.5 MG tablet TAKE 1 TABLET BY MOUTH EVERY DAY  30 tablet  3  . venlafaxine XR (EFFEXOR-XR) 75 MG 24 hr capsule Take 1 capsule (75 mg total) by mouth every evening.  90 capsule  0  . warfarin (COUMADIN) 2 MG tablet  TAKE TWO (2) TABLETS BY MOUTH EVERY OTHER DAY ALTERNATING WITH 5 MG TABLETS.  60 tablet  0  . warfarin (COUMADIN) 5 MG tablet Take 1 tablet (5 mg total) by mouth daily.  30 tablet  3  . warfarin (COUMADIN) 7.5 MG tablet Take 1 tablet (7.5 mg total) by mouth daily.  30 tablet  1   Labs:  Lab Results  Component Value Date   WBC 5.0 08/21/2012   HGB 14.3 08/21/2012   HCT 42.3 08/21/2012   MCV 88.2 08/21/2012   PLT 103* 08/21/2012   Lab Results  Component Value Date   CREATININE 0.93 08/21/2012   BUN 15 08/21/2012   NA 140 08/21/2012   K 3.9 08/21/2012   CL 105 08/21/2012   CO2 24 08/21/2012   Lab Results  Component Value Date   ALT 30 08/21/2012   AST 24 08/21/2012   BILITOT 0.6 08/21/2012    Physical Examination:  vitals were not taken for this visit.   Wt Readings from Last 3 Encounters:  08/21/12 143 lb 14.4 oz (  65.273 kg)  08/17/12 142 lb 12.8 oz (64.774 kg)  08/11/12 144 lb 12.8 oz (65.681 kg)     Lungs - Normal respiratory effort, chest expands symmetrically. Lungs are clear to auscultation, no crackles or wheezes.  Heart has regular rhythm and rate  Abdomen is soft and non tender with normal bowel sounds The treatment area shows brisk erythema  there is no moist desquamation however.  Assessment:  Patient tolerating treatments well except for issues as above  Plan: Continue treatment per original radiation prescription    -----------------------------------  Billie Lade, PhD, MD

## 2012-08-24 NOTE — Telephone Encounter (Signed)
Patient instructed to continue the same dose of Coumadin 3mg /4mg  alternating and to return for labs on 9/3.

## 2012-08-25 ENCOUNTER — Ambulatory Visit
Admission: RE | Admit: 2012-08-25 | Discharge: 2012-08-25 | Disposition: A | Discharge status: Home / Self Care | Payer: BC Managed Care – PPO | Source: Ambulatory Visit | Attending: Radiation Oncology | Admitting: Radiation Oncology

## 2012-08-25 ENCOUNTER — Encounter: Payer: Self-pay | Admitting: Radiation Oncology

## 2012-08-25 VITALS — BP 138/94 | HR 97 | Temp 98.1°F | Resp 20 | Wt 144.7 lb

## 2012-08-25 DIAGNOSIS — C50919 Malignant neoplasm of unspecified site of unspecified female breast: Secondary | ICD-10-CM

## 2012-08-25 MED ORDER — BIAFINE EX EMUL
Freq: Two times a day (BID) | CUTANEOUS | Status: DC
  Administered 2012-08-25: 15:00:00 via TOPICAL

## 2012-08-25 NOTE — Progress Notes (Signed)
[facility]    Radiation Oncology [address] [place]     Maryln Gottron, M.D. [place], [place] [address]               Billie Lade, M.D., Ph.D. Phone: [phone]      Molli Hazard A. Kathrynn Running, M.D. Fax: [phone]      Radene Gunning, M.D., Ph.D.         Lurline Hare, M.D.         Grayland Jack, M.D Weekly Treatment Management Note  Name: Nicole [name]     MRN: [id number]        CSN: [id number] Date: 08/25/2012      DOB: [birth year]  CC: [name], MD         Dwain Sarna    Status: Outpatient  Diagnosis: The encounter diagnosis was Breast cancer, stage 4.  Current Dose: 46.8 Gy  Current Fraction: 26  Planned Dose: 59.4 Gy  Narrative: Nicole [name] was seen today for weekly treatment management. The chart was checked and port films  were reviewed. . The position and angle is accurate for treatment. She is having more fatigue and discomfort along the chest wall area this time. She has not noticed any drainage.  Review of patient's allergies indicates no known allergies.   Current Outpatient Prescriptions  Medication Sig Dispense Refill  . alendronate (FOSAMAX) 70 MG tablet TAKE 1 TABLET ONCE A WEEK  12 tablet  0  . Calcium Carbonate (CALTRATE 600 PO) Take by mouth 2 (two) times daily.       . Cholecalciferol (VITAMIN D PO) Take 2,000 Units by mouth 2 (two) times daily.       [name] emollient (BIAFINE) cream Apply 1 application topically 2 (two) times daily.      [name] letrozole (FEMARA) 2.5 MG tablet TAKE 1 TABLET BY MOUTH EVERY DAY  30 tablet  3  . venlafaxine XR (EFFEXOR-XR) 75 MG 24 hr capsule Take 1 capsule (75 mg total) by mouth every evening.  90 capsule  0  . warfarin (COUMADIN) 2 MG tablet TAKE TWO (2) TABLETS BY MOUTH EVERY OTHER DAY ALTERNATING WITH 5 MG TABLETS.  60 tablet  0  . warfarin (COUMADIN) 5 MG tablet Take 1 tablet (5 mg total) by mouth daily.  30 tablet  3  . warfarin (COUMADIN) 7.5 MG tablet Take 1 tablet (7.5 mg total) by mouth daily.   30 tablet  1   Current Facility-Administered Medications  Medication Dose Route Frequency Provider Last Rate Last Dose  . topical emolient (BIAFINE) emulsion   Topical BID Billie Lade, MD       Labs:  Lab Results  Component Value Date   WBC 5.0 08/21/2012   HGB 14.3 08/21/2012   HCT 42.3 08/21/2012   MCV 88.2 08/21/2012   PLT 103* 08/21/2012   Lab Results  Component Value Date   CREATININE 0.93 08/21/2012   BUN 15 08/21/2012   NA 140 08/21/2012   K 3.9 08/21/2012   CL 105 08/21/2012   CO2 24 08/21/2012   Lab Results  Component Value Date   ALT 30 08/21/2012   AST 24 08/21/2012   BILITOT 0.6 08/21/2012    Physical Examination:  weight is 144 lb 11.2 oz (65.635 kg). Her oral temperature is 98.1 F (36.7 C). Her blood pressure is 138/94 and her pulse is 97. Her respiration is 20.    Wt Readings from Last 3 Encounters:  08/25/12 144 lb 11.2  oz (65.635 kg)  08/21/12 143 lb 14.4 oz (65.273 kg)  08/17/12 142 lb 12.8 oz (64.774 kg)     Lungs - Normal respiratory effort, chest expands symmetrically. Lungs are clear to auscultation, no crackles or wheezes.  Heart has regular rhythm and rate  Abdomen is soft and non tender with normal bowel sounds The treatment area shows brisk erythema  there is no moist desquamation.  Assessment:  Patient tolerating treatments well except for issues as above  Plan: Continue treatment per original radiation prescription    -----------------------------------  Billie Lade, PhD, MD

## 2012-08-25 NOTE — Progress Notes (Signed)
Pt denies pain, loss of appetite. Slight fatigue; gave pt Biafine lotion to apply to right breast for hyperpigmentation, itchiness.

## 2012-08-26 ENCOUNTER — Ambulatory Visit
Admission: RE | Admit: 2012-08-26 | Discharge: 2012-08-26 | Disposition: A | Discharge status: Home / Self Care | Payer: BC Managed Care – PPO | Source: Ambulatory Visit | Attending: Radiation Oncology | Admitting: Radiation Oncology

## 2012-08-26 ENCOUNTER — Ambulatory Visit (HOSPITAL_BASED_OUTPATIENT_CLINIC_OR_DEPARTMENT_OTHER): Payer: BC Managed Care – PPO

## 2012-08-26 VITALS — BP 133/92 | HR 101 | Temp 97.1°F | Resp 20

## 2012-08-26 DIAGNOSIS — C50419 Malignant neoplasm of upper-outer quadrant of unspecified female breast: Secondary | ICD-10-CM

## 2012-08-26 DIAGNOSIS — C50919 Malignant neoplasm of unspecified site of unspecified female breast: Secondary | ICD-10-CM

## 2012-08-26 DIAGNOSIS — Z452 Encounter for adjustment and management of vascular access device: Secondary | ICD-10-CM

## 2012-08-26 MED ORDER — HEPARIN SOD (PORK) LOCK FLUSH 100 UNIT/ML IV SOLN
500.0000 [IU] | Freq: Once | INTRAVENOUS | Status: AC
  Administered 2012-08-26: 500 [IU] via INTRAVENOUS
  Filled 2012-08-26: qty 5

## 2012-08-26 MED ORDER — SODIUM CHLORIDE 0.9 % IJ SOLN
10.0000 mL | INTRAMUSCULAR | Status: DC | PRN
  Administered 2012-08-26: 10 mL via INTRAVENOUS
  Filled 2012-08-26: qty 10

## 2012-08-27 ENCOUNTER — Ambulatory Visit
Admission: RE | Admit: 2012-08-27 | Discharge: 2012-08-27 | Disposition: A | Discharge status: Home / Self Care | Payer: BC Managed Care – PPO | Source: Ambulatory Visit | Attending: Radiation Oncology | Admitting: Radiation Oncology

## 2012-08-28 ENCOUNTER — Ambulatory Visit: Payer: BC Managed Care – PPO

## 2012-09-01 ENCOUNTER — Ambulatory Visit
Admission: RE | Admit: 2012-09-01 | Discharge: 2012-09-01 | Disposition: A | Discharge status: Home / Self Care | Payer: BC Managed Care – PPO | Source: Ambulatory Visit | Attending: Radiation Oncology | Admitting: Radiation Oncology

## 2012-09-01 ENCOUNTER — Encounter: Payer: Self-pay | Admitting: *Deleted

## 2012-09-01 ENCOUNTER — Other Ambulatory Visit (HOSPITAL_BASED_OUTPATIENT_CLINIC_OR_DEPARTMENT_OTHER): Payer: BC Managed Care – PPO | Admitting: Lab

## 2012-09-01 VITALS — BP 141/82 | HR 89 | Temp 98.1°F | Wt 144.0 lb

## 2012-09-01 DIAGNOSIS — I82409 Acute embolism and thrombosis of unspecified deep veins of unspecified lower extremity: Secondary | ICD-10-CM

## 2012-09-01 DIAGNOSIS — C50911 Malignant neoplasm of unspecified site of right female breast: Secondary | ICD-10-CM

## 2012-09-01 DIAGNOSIS — O2230 Deep phlebothrombosis in pregnancy, unspecified trimester: Secondary | ICD-10-CM

## 2012-09-01 LAB — PROTIME-INR: INR: 3.4 (ref 2.00–3.50)

## 2012-09-01 NOTE — Progress Notes (Unsigned)
Per MD,pt is to continue 3mg /4mg  coumadin and recheck 09/07/12

## 2012-09-01 NOTE — Progress Notes (Signed)
Patient here for weekly radiation assessment of right breast cancer.Has completed 29  Of 33 right chestwall treatments.Has dry desquamation.Using biafine and on occasion hydrocortisone 1%. Denies fatigue.Continues to work daily without difficulty.

## 2012-09-01 NOTE — Progress Notes (Signed)
[facility]    Radiation Oncology [address] [place]     Maryln Gottron, M.D. [place], [place] [address]               Billie Lade, M.D., Ph.D. Phone: [phone]      Molli Hazard A. Kathrynn Running, M.D. Fax: [phone]      Radene Gunning, M.D., Ph.D.         Lurline Hare, M.D.         Grayland Jack, M.D Weekly Treatment Management Note  Name: Nicole [name]     MRN: [id number]        CSN: [id number] Date: 09/01/2012      DOB: September 16, 1956  CC: [name], MD         Dwain Sarna    Status: Outpatient  Diagnosis: The encounter diagnosis was Breast cancer, right breast.  Current Dose: 5220 cGy  Current Fraction: 29  Planned Dose: 5940 cGy  Narrative: Nicole [name] was seen today for weekly treatment management. The chart was checked and port films  were reviewed as well as electron setup.  She is having itching and soreness along the treatment area. patient is able to continue working her usual schedule.   Review of patient's allergies indicates no known allergies. Current Outpatient Prescriptions  Medication Sig Dispense Refill  . alendronate (FOSAMAX) 70 MG tablet TAKE 1 TABLET ONCE A WEEK  12 tablet  0  . Calcium Carbonate (CALTRATE 600 PO) Take by mouth 2 (two) times daily.       . Cholecalciferol (VITAMIN D PO) Take 2,000 Units by mouth 2 (two) times daily.       [name] emollient (BIAFINE) cream Apply 1 application topically 2 (two) times daily.      [name] letrozole (FEMARA) 2.5 MG tablet TAKE 1 TABLET BY MOUTH EVERY DAY  30 tablet  3  . venlafaxine XR (EFFEXOR-XR) 75 MG 24 hr capsule Take 1 capsule (75 mg total) by mouth every evening.  90 capsule  0  . warfarin (COUMADIN) 2 MG tablet TAKE TWO (2) TABLETS BY MOUTH EVERY OTHER DAY ALTERNATING WITH 5 MG TABLETS.  60 tablet  0  . warfarin (COUMADIN) 5 MG tablet Take 4 mg by mouth daily.      [name] warfarin (COUMADIN) 7.5 MG tablet Take 3 mg by mouth daily. Alternates 4mg  with 3 mg every other day      . DISCONTD:  warfarin (COUMADIN) 5 MG tablet Take 1 tablet (5 mg total) by mouth daily.  30 tablet  3  . DISCONTD: warfarin (COUMADIN) 7.5 MG tablet Take 1 tablet (7.5 mg total) by mouth daily.  30 tablet  1   Labs:  Lab Results  Component Value Date   WBC 5.0 08/21/2012   HGB 14.3 08/21/2012   HCT 42.3 08/21/2012   MCV 88.2 08/21/2012   PLT 103* 08/21/2012   Lab Results  Component Value Date   CREATININE 0.93 08/21/2012   BUN 15 08/21/2012   NA 140 08/21/2012   K 3.9 08/21/2012   CL 105 08/21/2012   CO2 24 08/21/2012   Lab Results  Component Value Date   ALT 30 08/21/2012   AST 24 08/21/2012   BILITOT 0.6 08/21/2012    Physical Examination:  weight is 144 lb (65.318 kg). Her temperature is 98.1 F (36.7 C). Her blood pressure is 141/82 and her pulse is 89.    Wt Readings from Last 3 Encounters:  09/01/12 144 lb (65.318 kg)  08/25/12 144 lb 11.2 oz (65.635 kg)  08/21/12 143 lb 14.4 oz (65.273 kg)     Lungs - Normal respiratory effort, chest expands symmetrically. Lungs are clear to auscultation, no crackles or wheezes.  Heart has regular rhythm and rate  Abdomen is soft and non tender with normal bowel sounds Diffuse erythema throughout the treatment area with dry desquamation. There is an impending moist desquamation in some areas.  patient is having Her Husband Place Neosporin ointment with hydrocortisone cream. Patient will continue using Biafine for areas that are not as significant.  Assessment:  Patient tolerating treatments well except for issues as above  Plan: Continue treatment per original radiation prescription    -----------------------------------  Billie Lade, PhD, MD

## 2012-09-02 ENCOUNTER — Ambulatory Visit
Admission: RE | Admit: 2012-09-02 | Discharge: 2012-09-02 | Disposition: A | Discharge status: Home / Self Care | Payer: BC Managed Care – PPO | Source: Ambulatory Visit | Attending: Radiation Oncology | Admitting: Radiation Oncology

## 2012-09-03 ENCOUNTER — Ambulatory Visit
Admission: RE | Admit: 2012-09-03 | Discharge: 2012-09-03 | Disposition: A | Discharge status: Home / Self Care | Payer: BC Managed Care – PPO | Source: Ambulatory Visit | Attending: Radiation Oncology | Admitting: Radiation Oncology

## 2012-09-04 ENCOUNTER — Ambulatory Visit
Admission: RE | Admit: 2012-09-04 | Discharge: 2012-09-04 | Disposition: A | Discharge status: Home / Self Care | Payer: BC Managed Care – PPO | Source: Ambulatory Visit | Attending: Radiation Oncology | Admitting: Radiation Oncology

## 2012-09-07 ENCOUNTER — Encounter: Payer: Self-pay | Admitting: Radiation Oncology

## 2012-09-07 ENCOUNTER — Other Ambulatory Visit (HOSPITAL_BASED_OUTPATIENT_CLINIC_OR_DEPARTMENT_OTHER): Payer: BC Managed Care – PPO

## 2012-09-07 ENCOUNTER — Ambulatory Visit
Admission: RE | Admit: 2012-09-07 | Discharge: 2012-09-07 | Disposition: A | Discharge status: Home / Self Care | Payer: BC Managed Care – PPO | Source: Ambulatory Visit | Attending: Radiation Oncology | Admitting: Radiation Oncology

## 2012-09-07 VITALS — BP 155/92 | HR 96 | Temp 98.3°F | Wt 144.2 lb

## 2012-09-07 DIAGNOSIS — C50919 Malignant neoplasm of unspecified site of unspecified female breast: Secondary | ICD-10-CM

## 2012-09-07 DIAGNOSIS — I824Z9 Acute embolism and thrombosis of unspecified deep veins of unspecified distal lower extremity: Secondary | ICD-10-CM

## 2012-09-07 DIAGNOSIS — O2230 Deep phlebothrombosis in pregnancy, unspecified trimester: Secondary | ICD-10-CM

## 2012-09-07 LAB — PROTIME-INR
INR: 3.9 — ABNORMAL HIGH (ref 2.00–3.50)
Protime: 46.8 Seconds — ABNORMAL HIGH (ref 10.6–13.4)

## 2012-09-07 MED ORDER — SILVER SULFADIAZINE 1 % EX CREA
TOPICAL_CREAM | Freq: Two times a day (BID) | CUTANEOUS | Status: DC
  Administered 2012-09-07: 15:00:00 via TOPICAL

## 2012-09-07 NOTE — Progress Notes (Signed)
Gave instructions of use of silvadene cream ,apply to affected area, must wash off existing cream before reapplying ,patient gave teach back instruction 3:04 PM

## 2012-09-07 NOTE — Progress Notes (Signed)
[facility]    Radiation Oncology [address] [place]     Maryln Gottron, M.D. [place], [place] [address]               Billie Lade, M.D., Ph.D. Phone: [phone]      Molli Hazard A. Kathrynn Running, M.D. Fax: [phone]      Radene Gunning, M.D., Ph.D.         Lurline Hare, M.D.         Grayland Jack, M.D Weekly Treatment Management Note  Name: Nicole [name]     MRN: [id number]        CSN: [id number] Date: 09/07/2012      DOB: 1956-07-18  CC: [name], MD         Dwain Sarna    Status: Outpatient  Diagnosis: The encounter diagnosis was Breast cancer, stage 4.  Current Dose: 59.4 Gy  Current Fraction: 33  Planned Dose: 59.4 Gy  Narrative: [name] was seen today for weekly treatment management. The chart was checked and port films  were reviewed. Her electron setup was checked earlier in the course of treatment. She completes her radiation therapy today. Overall she has tolerated her treatments quite well. She has some soreness and itching along the treatment area. She has minimal fatigue.  Review of patient's allergies indicates no known allergies. Current Outpatient Prescriptions  Medication Sig Dispense Refill  . alendronate (FOSAMAX) 70 MG tablet TAKE 1 TABLET ONCE A WEEK  12 tablet  0  . Calcium Carbonate (CALTRATE 600 PO) Take by mouth 2 (two) times daily.       . Cholecalciferol (VITAMIN D PO) Take 2,000 Units by mouth 2 (two) times daily.       [name] emollient (BIAFINE) cream Apply 1 application topically 2 (two) times daily.      [name] letrozole (FEMARA) 2.5 MG tablet TAKE 1 TABLET BY MOUTH EVERY DAY  30 tablet  3  . venlafaxine XR (EFFEXOR-XR) 75 MG 24 hr capsule Take 1 capsule (75 mg total) by mouth every evening.  90 capsule  0  . warfarin (COUMADIN) 2 MG tablet TAKE TWO (2) TABLETS BY MOUTH EVERY OTHER DAY ALTERNATING WITH 5 MG TABLETS.  60 tablet  0  . warfarin (COUMADIN) 5 MG tablet Take 4 mg by mouth daily.      [name] warfarin (COUMADIN) 7.5 MG  tablet Take 3 mg by mouth daily. Alternates 4mg  with 3 mg every other day       Current Facility-Administered Medications  Medication Dose Route Frequency Provider Last Rate Last Dose  . silver sulfADIAZINE (SILVADENE) 1 % cream   Topical BID Billie Lade, MD       Labs:  Lab Results  Component Value Date   WBC 5.0 08/21/2012   HGB 14.3 08/21/2012   HCT 42.3 08/21/2012   MCV 88.2 08/21/2012   PLT 103* 08/21/2012   Lab Results  Component Value Date   CREATININE 0.93 08/21/2012   BUN 15 08/21/2012   NA 140 08/21/2012   K 3.9 08/21/2012   CL 105 08/21/2012   CO2 24 08/21/2012   Lab Results  Component Value Date   ALT 30 08/21/2012   AST 24 08/21/2012   BILITOT 0.6 08/21/2012    Physical Examination:  weight is 144 lb 3.2 oz (65.409 kg). Her temperature is 98.3 F (36.8 C). Her blood pressure is 155/92 and her pulse is 96.    Wt Readings from Last 3 Encounters:  09/07/12 144 lb 3.2 oz (65.409 kg)  09/01/12 144 lb (65.318 kg)  08/25/12 144 lb 11.2 oz (65.635 kg)     Lungs - Normal respiratory effort, chest expands symmetrically. Lungs are clear to auscultation, no crackles or wheezes.  Heart has regular rhythm and rate  Abdomen is soft and non tender with normal bowel sounds The right chest wall area shows significant erythema and dry desquamation and some small areas of moist desquamation. Assessment:  Patient has tolerated treatments well.  She will be placed on Silvadene in light of her skin reaction.  Plan: Followup in one month.

## 2012-09-07 NOTE — Progress Notes (Signed)
Patient completes treatment today.Has some peeling of right axilla.To apply neosporin at least twice daily in peeled area and biafine on the remainder of treatment field.

## 2012-09-07 NOTE — Progress Notes (Incomplete)
°  Radiation Oncology         [phone]) [phone] ________________________________  Name: Nicole [name] MRN: [id number]  Date: 09/07/2012  DOB: 11/25/1956  End of Treatment Note  Diagnosis: Stage IV right breast cancer  Indication for treatment:  ***       Radiation treatment dates:  07/21/2012-09/07/2012  Site/dose:   ***  Beams/energy:   ***  Narrative: The patient tolerated radiation treatment relatively well.   ***  Plan: The patient has completed radiation treatment. The patient will return to radiation oncology clinic for routine followup in one month. I advised them to call or return sooner if they have any questions or concerns related to their recovery or treatment.  -----------------------------------  Billie Lade, PhD, MD

## 2012-09-07 NOTE — Progress Notes (Signed)
Silvadene cream given to patient by MD,  2:52 PM

## 2012-09-08 ENCOUNTER — Telehealth: Payer: Self-pay | Admitting: *Deleted

## 2012-09-08 NOTE — Telephone Encounter (Signed)
Per MD, notified pt spouse to remain on current dose of coumadin 3mg /alternating with 4mg . Will recheck labs 9/23

## 2012-09-12 ENCOUNTER — Other Ambulatory Visit: Payer: Self-pay | Admitting: Oncology

## 2012-09-14 ENCOUNTER — Other Ambulatory Visit (HOSPITAL_BASED_OUTPATIENT_CLINIC_OR_DEPARTMENT_OTHER): Payer: BC Managed Care – PPO | Admitting: Lab

## 2012-09-14 ENCOUNTER — Other Ambulatory Visit: Payer: Self-pay | Admitting: *Deleted

## 2012-09-14 DIAGNOSIS — O2230 Deep phlebothrombosis in pregnancy, unspecified trimester: Secondary | ICD-10-CM

## 2012-09-14 DIAGNOSIS — C50919 Malignant neoplasm of unspecified site of unspecified female breast: Secondary | ICD-10-CM

## 2012-09-14 DIAGNOSIS — I808 Phlebitis and thrombophlebitis of other sites: Secondary | ICD-10-CM

## 2012-09-14 DIAGNOSIS — I82409 Acute embolism and thrombosis of unspecified deep veins of unspecified lower extremity: Secondary | ICD-10-CM

## 2012-09-14 LAB — PROTIME-INR: Protime: 43.2 Seconds — ABNORMAL HIGH (ref 10.6–13.4)

## 2012-09-14 MED ORDER — VENLAFAXINE HCL ER 75 MG PO CP24: 75.0000 mg | ORAL_CAPSULE | Freq: Every evening | ORAL | Status: DC

## 2012-09-14 MED ORDER — WARFARIN SODIUM 2 MG PO TABS: 2.0000 mg | ORAL_TABLET | ORAL | Status: DC

## 2012-09-17 ENCOUNTER — Telehealth: Payer: Self-pay | Admitting: *Deleted

## 2012-09-17 NOTE — Telephone Encounter (Signed)
Patient called in to move lab only time to 8:00am on 09-21-2012

## 2012-09-20 NOTE — Progress Notes (Signed)
  Radiation Oncology         [phone]) [phone] ________________________________  Name: Nicole [name] MRN: [id number]  Date: 09/07/2012  DOB: September 14, 1956  End of Treatment Note  Diagnosis: Stage IV right breast cancer  Indication for treatment:  Local regional control       Radiation treatment dates:  07/21/2012-09/07/2012  Site/dose:   Right chest wal,l high axilla and supraclavicular region 4500 cGy in 25 fractions.  The mastectomy scar region was boosted further to cumulative dose of 5940 cGy  Beams/energy:   10 MV and 6 MV for the photon fields. The mastectomy scar region was boosted further with 6 MeV electrons.  Bolus was used to ensure adequate dose to the superficial aspects of the target area.  Narrative: The patient tolerated radiation treatment relatively well.   Towards the end of her therapy she experience mild fatigue as well as pruritus. In addition the patient did develop early moist desquamation in the last week of her treatment. She was placed on Silvadene for this issue.  Plan: The patient has completed radiation treatment. The patient will return to radiation oncology clinic for routine followup in one month. I advised them to call or return sooner if they have any questions or concerns related to their recovery or treatment.  -----------------------------------  Billie Lade, PhD, MD

## 2012-09-21 ENCOUNTER — Other Ambulatory Visit (HOSPITAL_BASED_OUTPATIENT_CLINIC_OR_DEPARTMENT_OTHER): Payer: BC Managed Care – PPO

## 2012-09-21 ENCOUNTER — Encounter (INDEPENDENT_AMBULATORY_CARE_PROVIDER_SITE_OTHER): Payer: Self-pay | Admitting: General Surgery

## 2012-09-21 ENCOUNTER — Ambulatory Visit (INDEPENDENT_AMBULATORY_CARE_PROVIDER_SITE_OTHER): Payer: BC Managed Care – PPO | Admitting: General Surgery

## 2012-09-21 VITALS — BP 130/74 | HR 82 | Resp 18 | Ht 64.0 in | Wt 143.0 lb

## 2012-09-21 DIAGNOSIS — I82409 Acute embolism and thrombosis of unspecified deep veins of unspecified lower extremity: Secondary | ICD-10-CM

## 2012-09-21 DIAGNOSIS — C50919 Malignant neoplasm of unspecified site of unspecified female breast: Secondary | ICD-10-CM

## 2012-09-21 DIAGNOSIS — O2230 Deep phlebothrombosis in pregnancy, unspecified trimester: Secondary | ICD-10-CM

## 2012-09-21 LAB — PROTIME-INR: Protime: 46.8 Seconds — ABNORMAL HIGH (ref 10.6–13.4)

## 2012-09-21 NOTE — Progress Notes (Signed)
Subjective:     Patient ID: Nicole [name], female   DOB: Aug 30, 1956, 56 y.o.   MRN: [id number]  HPI This is a 56 year old female who presented with stage IV breast cancer metastatic to the bone. She underwent initial chemotherapy which was not tolerated well and then switched to letrozole. She's had a great response to that. Subsequent to that she has undergone a right simple mastectomy. She recently has completed radiation therapy to this area as well. She tolerated that well except for some desquamation and erythema of her skin. This is all improving now. She is still tolerating a letrozole well. She has some swelling at the inferior flap or a mastectomy she is concerned about today. Her last mammogram on the left was in August of 2012.  Review of Systems     Objective:   Physical Exam  Vitals reviewed. Constitutional: She appears well-developed and well-nourished.  Pulmonary/Chest: Right breast exhibits no mass. Left breast exhibits no inverted nipple, no mass, no nipple discharge, no skin change and no tenderness.    Lymphadenopathy:    She has no cervical adenopathy.    She has no axillary adenopathy.       Right: No supraclavicular adenopathy present.       Left: No supraclavicular adenopathy present.       Assessment:     Stage IV breast cancer    Plan:     She is tolerated radiation well. I think all of the changes she has at her surgical site right now are due to radiation he should improve soon. She is due for a left-sided mammogram which we will get her scheduled for. I told her I will see her back in January just to make sure that her exam was still fine. We talked about 15 minutes today for counseling regarding her left resolved as well as her effexor and her general medical condition.

## 2012-09-22 ENCOUNTER — Telehealth (INDEPENDENT_AMBULATORY_CARE_PROVIDER_SITE_OTHER): Payer: Self-pay

## 2012-09-22 ENCOUNTER — Telehealth: Payer: Self-pay | Admitting: *Deleted

## 2012-09-22 DIAGNOSIS — C50911 Malignant neoplasm of unspecified site of right female breast: Secondary | ICD-10-CM

## 2012-09-22 NOTE — Telephone Encounter (Signed)
Per MD, have notified pt to continue present dose of coumadin 3/4mg  daily. Will recheck 09/28/12

## 2012-09-22 NOTE — Telephone Encounter (Signed)
I called pt to ask if she was scheduled for her lt side mgm w/[facility] but she is not scheduled. I will work on getting this scheduled for her and call her back with the appt time.

## 2012-09-22 NOTE — Telephone Encounter (Signed)
Called pt back to let her know that I have her scheduled at [facility] for 09/23/12 at 1:45 to just get the left side mgm done since her last mgm was in 08/28/11. The pt understands the appt.

## 2012-09-23 ENCOUNTER — Other Ambulatory Visit (INDEPENDENT_AMBULATORY_CARE_PROVIDER_SITE_OTHER): Payer: Self-pay | Admitting: General Surgery

## 2012-09-23 ENCOUNTER — Ambulatory Visit
Admission: RE | Admit: 2012-09-23 | Discharge: 2012-09-23 | Disposition: A | Discharge status: Home / Self Care | Payer: BC Managed Care – PPO | Source: Ambulatory Visit | Attending: General Surgery | Admitting: General Surgery

## 2012-09-23 DIAGNOSIS — C50911 Malignant neoplasm of unspecified site of right female breast: Secondary | ICD-10-CM

## 2012-09-28 ENCOUNTER — Other Ambulatory Visit (HOSPITAL_BASED_OUTPATIENT_CLINIC_OR_DEPARTMENT_OTHER): Payer: BC Managed Care – PPO | Admitting: Lab

## 2012-09-28 DIAGNOSIS — I82409 Acute embolism and thrombosis of unspecified deep veins of unspecified lower extremity: Secondary | ICD-10-CM

## 2012-09-28 DIAGNOSIS — O2230 Deep phlebothrombosis in pregnancy, unspecified trimester: Secondary | ICD-10-CM

## 2012-09-28 LAB — PROTIME-INR
INR: 2.1 (ref 2.00–3.50)
Protime: 25.2 Seconds — ABNORMAL HIGH (ref 10.6–13.4)

## 2012-10-05 ENCOUNTER — Other Ambulatory Visit: Payer: BC Managed Care – PPO | Admitting: Lab

## 2012-10-05 ENCOUNTER — Encounter: Payer: Self-pay | Admitting: *Deleted

## 2012-10-05 DIAGNOSIS — Z7901 Long term (current) use of anticoagulants: Secondary | ICD-10-CM

## 2012-10-05 DIAGNOSIS — O2230 Deep phlebothrombosis in pregnancy, unspecified trimester: Secondary | ICD-10-CM

## 2012-10-05 DIAGNOSIS — Z5181 Encounter for therapeutic drug level monitoring: Secondary | ICD-10-CM

## 2012-10-05 LAB — PROTIME-INR: INR: 3.5 (ref 2.00–3.50)

## 2012-10-05 NOTE — Progress Notes (Signed)
Per Dr. Donnie Coffin patient to stay on alternating 3mg  and 4mg  Coumadin.  Will recheck labs 10/12/12. Patient informed

## 2012-10-07 ENCOUNTER — Encounter: Payer: Self-pay | Admitting: Radiation Oncology

## 2012-10-08 ENCOUNTER — Ambulatory Visit
Admission: RE | Admit: 2012-10-08 | Discharge: 2012-10-08 | Disposition: A | Discharge status: Home / Self Care | Payer: BC Managed Care – PPO | Source: Ambulatory Visit | Attending: Radiation Oncology | Admitting: Radiation Oncology

## 2012-10-08 ENCOUNTER — Encounter: Payer: Self-pay | Admitting: Radiation Oncology

## 2012-10-08 VITALS — BP 137/88 | HR 91 | Temp 97.9°F | Wt 144.9 lb

## 2012-10-08 DIAGNOSIS — C50919 Malignant neoplasm of unspecified site of unspecified female breast: Secondary | ICD-10-CM

## 2012-10-08 NOTE — Patient Instructions (Signed)
Routine followup in 6 months  Continue Femara as recommended by Dr. Donnie Coffin

## 2012-10-08 NOTE — Progress Notes (Signed)
Here for routine follow up post completion or right breast radiation on 09/07/12.Denies pain.Continues to take femara over last 2 years.

## 2012-10-08 NOTE — Progress Notes (Signed)
Radiation Oncology         [phone]) [phone] ________________________________  Name: Nicole [name] MRN: [id number]  Date: 10/08/2012  DOB: Feb 16, 1956  Follow-Up Visit Note  CC: Pierce Crane, MD  Pierce Crane, MD  Diagnosis:   Stage IV right breast cancer  Interval Since Last Radiation:  One month   Narrative:  The patient returns today for routine follow-up.  She seems to be doing well at this point. She denies any itching or discomfort along the chest wall area.  She did see Dr. Dwain Sarna for concerns of possible recurrence along the right chest wall area. Dr. Dwain Sarna did feel this was radiation effect with some associated edema. She continues to take Femara. She did have a left mammogram since her radiation therapy along the right chest wall area. This showed no problems along the left side.                             ALLERGIES:   has no known allergies.  Meds: Current Outpatient Prescriptions  Medication Sig Dispense Refill  . alendronate (FOSAMAX) 70 MG tablet TAKE 1 TABLET ONCE A WEEK  12 tablet  0  . Calcium Carbonate (CALTRATE 600 PO) Take by mouth 2 (two) times daily.       . Cholecalciferol (VITAMIN D PO) Take 2,000 Units by mouth 2 (two) times daily.       [name] letrozole (FEMARA) 2.5 MG tablet TAKE 1 TABLET BY MOUTH EVERY DAY  30 tablet  3  . venlafaxine XR (EFFEXOR-XR) 75 MG 24 hr capsule Take 1 capsule (75 mg total) by mouth every evening.  90 capsule  0  . warfarin (COUMADIN) 2 MG tablet Take 1 tablet (2 mg total) by mouth as directed.  60 tablet  0  . warfarin (COUMADIN) 5 MG tablet Take 4 mg by mouth daily.      [name] warfarin (COUMADIN) 7.5 MG tablet Take 3 mg by mouth daily. Alternates 4mg  with 3 mg every other day        Physical Findings: The patient is in no acute distress. Patient is alert and oriented.  weight is 144 lb 14.4 oz (65.726 kg). Her temperature is 97.9 F (36.6 C). Her blood pressure is 137/88 and her pulse is 91. [name]   Lymphadenopathy:  She has no  cervical adenopathy.  She has no axillary adenopathy.  Right: No supraclavicular adenopathy present.  Left: No supraclavicular adenopathy present.  The lungs are clear. The heart has a regular rhythm and rate The left breast area shows some thickening in the upper central aspect  consistent with fibrocystic change. The right chest wall area shows some edema,  mild erythema and hyperpigmentation changes consistent with radiation effect.    Lab Findings: Lab Results  Component Value Date   WBC 5.0 08/21/2012   HGB 14.3 08/21/2012   HCT 42.3 08/21/2012   MCV 88.2 08/21/2012   PLT 103* 08/21/2012    @LASTCHEM @  Radiographic Findings: Mm Digital Screening Unilat L  09/24/2012  *RADIOLOGY REPORT*  Clinical Data: Screening.  MAMMOGRAPHIC UNILATERAL LEFT DIGITAL SCREENING WITH CAD  Comparison:  Previous exams.  Findings: The breast tissue is extremely dense. No suspicious masses, architectural distortion, or calcifications are present.  Images were processed with CAD.  IMPRESSION: No mammographic evidence of malignancy.  A result letter of this screening mammogram will be mailed directly to the patient.  RECOMMENDATION: Screening mammogram in one year. (Code:SM-B-01Y)  BI-RADS  CATEGORY 2:  Benign finding(s).   Original Report Authenticated By: Sherian Rein, M.D.     Impression:  The patient is recovering from the effects of radiation.  No signs of recurrence along the right chest wall area  Plan:  Routine followup in 6 months.  _____________________________________  Billie Lade, PhD, MD

## 2012-10-12 ENCOUNTER — Other Ambulatory Visit (HOSPITAL_BASED_OUTPATIENT_CLINIC_OR_DEPARTMENT_OTHER): Payer: BC Managed Care – PPO | Admitting: Lab

## 2012-10-12 DIAGNOSIS — O2230 Deep phlebothrombosis in pregnancy, unspecified trimester: Secondary | ICD-10-CM

## 2012-10-12 DIAGNOSIS — I82409 Acute embolism and thrombosis of unspecified deep veins of unspecified lower extremity: Secondary | ICD-10-CM

## 2012-10-12 LAB — PROTIME-INR

## 2012-10-13 ENCOUNTER — Encounter: Payer: Self-pay | Admitting: *Deleted

## 2012-10-13 NOTE — Progress Notes (Unsigned)
Called and left patient message to let her know that no change with coumadin dose. Alternating 3mg  and 4mg .

## 2012-10-19 ENCOUNTER — Other Ambulatory Visit: Payer: BC Managed Care – PPO | Admitting: Lab

## 2012-10-20 ENCOUNTER — Other Ambulatory Visit (HOSPITAL_BASED_OUTPATIENT_CLINIC_OR_DEPARTMENT_OTHER): Payer: BC Managed Care – PPO | Admitting: Lab

## 2012-10-20 ENCOUNTER — Telehealth: Payer: Self-pay | Admitting: Oncology

## 2012-10-20 ENCOUNTER — Ambulatory Visit (HOSPITAL_BASED_OUTPATIENT_CLINIC_OR_DEPARTMENT_OTHER): Payer: BC Managed Care – PPO | Admitting: Oncology

## 2012-10-20 VITALS — BP 152/84 | HR 83 | Temp 98.5°F | Resp 20 | Ht 64.0 in | Wt 143.5 lb

## 2012-10-20 DIAGNOSIS — Z170 Estrogen receptor positive status [ER+]: Secondary | ICD-10-CM

## 2012-10-20 DIAGNOSIS — Z86718 Personal history of other venous thrombosis and embolism: Secondary | ICD-10-CM

## 2012-10-20 DIAGNOSIS — C50919 Malignant neoplasm of unspecified site of unspecified female breast: Secondary | ICD-10-CM

## 2012-10-20 DIAGNOSIS — C50419 Malignant neoplasm of upper-outer quadrant of unspecified female breast: Secondary | ICD-10-CM

## 2012-10-20 DIAGNOSIS — C773 Secondary and unspecified malignant neoplasm of axilla and upper limb lymph nodes: Secondary | ICD-10-CM

## 2012-10-20 LAB — PROTIME-INR
INR: 2.3 (ref 2.00–3.50)
Protime: 27.6 Seconds — ABNORMAL HIGH (ref 10.6–13.4)

## 2012-10-20 NOTE — Telephone Encounter (Signed)
gve the pt her nov-jan 2014 appt calendar along with the ct scan appt. Pt will arrive two hrs earlier to drink the water based oral contrast

## 2012-10-20 NOTE — Progress Notes (Signed)
Hematology and Oncology Follow Up Visit  Nicole [name] [id number] November 09, 1956 56 y.o. 10/20/2012 10:22 AM   Principle Diagnosis: Locally advanced breast cancer with metastases currently on Femara. She was  diagnosed July 2011. Initial trial with tesataxel, complicated by persistent thrombocytopenia. She has been on Femara for close to 2 years with excellent clinical response. Most recent PET scan performed in February of this year showed minimal if any uptake seen. Status post mastectomy rate followed by radiation therapy completed September 2013. She had some coughing skin toxicity this is recovered.  Interim History: Medications: I have reviewed the patient's current medications.  Allergies: No Known Allergies  Past Medical History, Surgical history, Social history, and Family History were reviewed and updated.  Review of Systems: Constitutional:  Negative for fever, chills, night sweats, anorexia, weight loss, pain. Cardiovascular: no chest pain or dyspnea on exertion Respiratory: no cough, shortness of breath, or wheezing Neurological: no TIA or stroke symptoms Dermatological: negative ENT: negative Skin Gastrointestinal: no abdominal pain, change in bowel habits, or black or bloody stools Genito-Urinary: no dysuria, trouble voiding, or hematuria Hematological and Lymphatic: negative Breast: . Musculoskeletal: positive for - joint pain Remaining ROS negative.  Physical Exam: Blood pressure 152/84, pulse 83, temperature 98.5 F (36.9 C), resp. rate 20, height 5\' 4"  (1.626 m), weight 143 lb 8 oz (65.091 kg). ECOG: 0 General appearance: alert, cooperative and appears stated age Head: Normocephalic, without obvious abnormality, atraumatic Neck: no adenopathy, no carotid bruit, no JVD, supple, symmetrical, trachea midline and thyroid not enlarged, symmetric, no tenderness/mass/nodules Lymph nodes: Cervical, supraclavicular, and axillary nodes normal. Cardiac :  normal Pulmonary:normal Breasts: s/p rt mastectomy., Right chest wall has essentially cleared for radiation effect. Scar is well healed. In the medial portion of the scar there is some area of indurated skin tissue which is palpable and slightly erythematous. There is some small areas similar to this along the scar line. These are nontender. Abdomen: Normal Extremities lt arm swelling  Neuro:  Lab Results: Lab Results  Component Value Date   WBC 5.0 08/21/2012   HGB 14.3 08/21/2012   HCT 42.3 08/21/2012   MCV 88.2 08/21/2012   PLT 103* 08/21/2012     Chemistry      Component Value Date/Time   NA 140 08/21/2012 1307   K 3.9 08/21/2012 1307   CL 105 08/21/2012 1307   CO2 24 08/21/2012 1307   BUN 15 08/21/2012 1307   CREATININE 0.93 08/21/2012 1307      Component Value Date/Time   CALCIUM 9.2 08/21/2012 1307   ALKPHOS 59 08/21/2012 1307   AST 24 08/21/2012 1307   ALT 30 08/21/2012 1307   BILITOT 0.6 08/21/2012 1307       Radiological Studies: chest X-ray n/a n/a Mammogram n/a Bone density n/a  Impression and Plan: Residual er+ N+ breast cancer s/p mrm, with excellent response to Femara. We will restage her with scans in early December I will see her at that point. 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 Scar line nodularity, I will have Dr. Dwain Sarna to review this and get his thoughts.  Pierce Crane M.D. [facility]  More than 50% of the visit was spent in patient-related counselling   Pierce Crane, MD 10/22/201310:22 AM

## 2012-10-22 ENCOUNTER — Other Ambulatory Visit: Payer: Self-pay | Admitting: Oncology

## 2012-10-22 DIAGNOSIS — C50919 Malignant neoplasm of unspecified site of unspecified female breast: Secondary | ICD-10-CM

## 2012-10-22 DIAGNOSIS — I82890 Acute embolism and thrombosis of other specified veins: Secondary | ICD-10-CM

## 2012-10-26 ENCOUNTER — Ambulatory Visit (INDEPENDENT_AMBULATORY_CARE_PROVIDER_SITE_OTHER): Payer: BC Managed Care – PPO | Admitting: General Surgery

## 2012-10-26 ENCOUNTER — Encounter (INDEPENDENT_AMBULATORY_CARE_PROVIDER_SITE_OTHER): Payer: Self-pay | Admitting: General Surgery

## 2012-10-26 VITALS — BP 130/88 | HR 101 | Temp 99.0°F | Ht 64.0 in | Wt 144.4 lb

## 2012-10-26 DIAGNOSIS — C50919 Malignant neoplasm of unspecified site of unspecified female breast: Secondary | ICD-10-CM

## 2012-10-26 NOTE — Progress Notes (Signed)
Subjective:     Patient ID: [name], female   DOB: 1956-12-18, 56 y.o.   MRN: [id number]  HPI This is a 56 year old female I know well after she underwent endocrine therapy for stage IV right breast cancer. She responded very well and eventually underwent a right simple mastectomy for local control. This was followed by radiation therapy. She has done well and I saw her about a month ago. She really has no complaints. She was referred back by Dr. Donnie Coffin for evaluation for some nodularity around her mastectomy scar. She has no real concerns today.  Review of Systems     Objective:   Physical Exam Right mastectomy scar with some nodularity most prominent at the medial aspect of her incision. I think this is just scar tissue in general and do not think this is a recurrence It is been present for a while and I think is just a little bit worse after she received her radiation therapy    Assessment:    stage IV breast cancer    Plan:    I think this is just postoperative in nature but I will send her to get an ultrasound to make sure that that is the case I

## 2012-10-28 ENCOUNTER — Other Ambulatory Visit: Payer: BC Managed Care – PPO

## 2012-10-30 ENCOUNTER — Ambulatory Visit
Admission: RE | Admit: 2012-10-30 | Discharge: 2012-10-30 | Disposition: A | Discharge status: Home / Self Care | Payer: BC Managed Care – PPO | Source: Ambulatory Visit | Attending: General Surgery | Admitting: General Surgery

## 2012-10-30 DIAGNOSIS — C50919 Malignant neoplasm of unspecified site of unspecified female breast: Secondary | ICD-10-CM

## 2012-11-02 ENCOUNTER — Other Ambulatory Visit (HOSPITAL_BASED_OUTPATIENT_CLINIC_OR_DEPARTMENT_OTHER): Payer: BC Managed Care – PPO | Admitting: Lab

## 2012-11-02 DIAGNOSIS — O2230 Deep phlebothrombosis in pregnancy, unspecified trimester: Secondary | ICD-10-CM

## 2012-11-02 DIAGNOSIS — I82409 Acute embolism and thrombosis of unspecified deep veins of unspecified lower extremity: Secondary | ICD-10-CM

## 2012-11-02 DIAGNOSIS — C50919 Malignant neoplasm of unspecified site of unspecified female breast: Secondary | ICD-10-CM

## 2012-11-02 LAB — CBC WITH DIFFERENTIAL/PLATELET
BASO%: 0.3 % (ref 0.0–2.0)
LYMPH%: 21.5 % (ref 14.0–49.7)
MCHC: 34.5 g/dL (ref 31.5–36.0)
MCV: 85.6 fL (ref 79.5–101.0)
MONO#: 0.3 10*3/uL (ref 0.1–0.9)
MONO%: 9.3 % (ref 0.0–14.0)
NEUT#: 2.1 10*3/uL (ref 1.5–6.5)
Platelets: 90 10*3/uL — ABNORMAL LOW (ref 145–400)
RBC: 4.87 10*6/uL (ref 3.70–5.45)
RDW: 13.7 % (ref 11.2–14.5)
WBC: 3 10*3/uL — ABNORMAL LOW (ref 3.9–10.3)
nRBC: 0 % (ref 0–0)

## 2012-11-02 LAB — PROTIME-INR
INR: 2.5 (ref 2.00–3.50)
Protime: 30 Seconds — ABNORMAL HIGH (ref 10.6–13.4)

## 2012-11-16 ENCOUNTER — Other Ambulatory Visit (HOSPITAL_BASED_OUTPATIENT_CLINIC_OR_DEPARTMENT_OTHER): Payer: BC Managed Care – PPO | Admitting: Lab

## 2012-11-16 DIAGNOSIS — C50919 Malignant neoplasm of unspecified site of unspecified female breast: Secondary | ICD-10-CM

## 2012-11-16 DIAGNOSIS — I82409 Acute embolism and thrombosis of unspecified deep veins of unspecified lower extremity: Secondary | ICD-10-CM

## 2012-11-16 DIAGNOSIS — O2230 Deep phlebothrombosis in pregnancy, unspecified trimester: Secondary | ICD-10-CM

## 2012-11-16 LAB — CBC WITH DIFFERENTIAL/PLATELET
BASO%: 0.3 % (ref 0.0–2.0)
EOS%: 0 % (ref 0.0–7.0)
HCT: 40.4 % (ref 34.8–46.6)
LYMPH%: 20 % (ref 14.0–49.7)
MCH: 29.1 pg (ref 25.1–34.0)
MCHC: 34.2 g/dL (ref 31.5–36.0)
MCV: 85.2 fL (ref 79.5–101.0)
MONO%: 11 % (ref 0.0–14.0)
NEUT%: 68.7 % (ref 38.4–76.8)
Platelets: 79 10*3/uL — ABNORMAL LOW (ref 145–400)
RBC: 4.74 10*6/uL (ref 3.70–5.45)

## 2012-11-16 LAB — PROTIME-INR: Protime: 28.8 Seconds — ABNORMAL HIGH (ref 10.6–13.4)

## 2012-11-19 ENCOUNTER — Telehealth: Payer: Self-pay | Admitting: *Deleted

## 2012-11-19 NOTE — Telephone Encounter (Signed)
Patient's husband confirmed over the phone the new date and time on 12-14-2012 lab only 12-21-2012 md only

## 2012-11-30 ENCOUNTER — Other Ambulatory Visit: Payer: BC Managed Care – PPO | Admitting: Lab

## 2012-12-02 ENCOUNTER — Ambulatory Visit (HOSPITAL_COMMUNITY)
Admission: RE | Admit: 2012-12-02 | Discharge: 2012-12-02 | Disposition: A | Discharge status: Home / Self Care | Payer: BC Managed Care – PPO | Source: Ambulatory Visit | Attending: Oncology | Admitting: Oncology

## 2012-12-02 ENCOUNTER — Other Ambulatory Visit (HOSPITAL_BASED_OUTPATIENT_CLINIC_OR_DEPARTMENT_OTHER): Payer: BC Managed Care – PPO | Admitting: Lab

## 2012-12-02 DIAGNOSIS — I82409 Acute embolism and thrombosis of unspecified deep veins of unspecified lower extremity: Secondary | ICD-10-CM

## 2012-12-02 DIAGNOSIS — C50919 Malignant neoplasm of unspecified site of unspecified female breast: Secondary | ICD-10-CM

## 2012-12-02 LAB — CBC WITH DIFFERENTIAL/PLATELET
Basophils Absolute: 0 10*3/uL (ref 0.0–0.1)
Eosinophils Absolute: 0 10*3/uL (ref 0.0–0.5)
HCT: 40.2 % (ref 34.8–46.6)
HGB: 14 g/dL (ref 11.6–15.9)
MONO#: 0.5 10*3/uL (ref 0.1–0.9)
NEUT#: 2.9 10*3/uL (ref 1.5–6.5)
RDW: 14.2 % (ref 11.2–14.5)
lymph#: 0.7 10*3/uL — ABNORMAL LOW (ref 0.9–3.3)

## 2012-12-02 LAB — PROTIME-INR
INR: 3 (ref 2.00–3.50)
Protime: 36 Seconds — ABNORMAL HIGH (ref 10.6–13.4)

## 2012-12-02 MED ORDER — IOHEXOL 300 MG/ML  SOLN
100.0000 mL | Freq: Once | INTRAMUSCULAR | Status: AC | PRN
  Administered 2012-12-02: 100 mL via INTRAVENOUS

## 2012-12-03 ENCOUNTER — Encounter: Payer: Self-pay | Admitting: *Deleted

## 2012-12-03 NOTE — Progress Notes (Signed)
Called and spoke with patient about her coumadin dose.  Patient is alternating 4mg  and 3mg .  Per Dr. Donnie Coffin patient is to stay on current dose.

## 2012-12-09 ENCOUNTER — Ambulatory Visit: Payer: BC Managed Care – PPO | Admitting: Oncology

## 2012-12-14 ENCOUNTER — Other Ambulatory Visit: Payer: Self-pay | Admitting: *Deleted

## 2012-12-14 ENCOUNTER — Encounter: Payer: Self-pay | Admitting: *Deleted

## 2012-12-14 ENCOUNTER — Other Ambulatory Visit (HOSPITAL_BASED_OUTPATIENT_CLINIC_OR_DEPARTMENT_OTHER): Payer: BC Managed Care – PPO | Admitting: Lab

## 2012-12-14 DIAGNOSIS — C50419 Malignant neoplasm of upper-outer quadrant of unspecified female breast: Secondary | ICD-10-CM

## 2012-12-14 DIAGNOSIS — I82409 Acute embolism and thrombosis of unspecified deep veins of unspecified lower extremity: Secondary | ICD-10-CM

## 2012-12-14 DIAGNOSIS — C50919 Malignant neoplasm of unspecified site of unspecified female breast: Secondary | ICD-10-CM

## 2012-12-14 LAB — PROTIME-INR
INR: 2.7 (ref 2.00–3.50)
Protime: 32.4 Seconds — ABNORMAL HIGH (ref 10.6–13.4)

## 2012-12-14 LAB — CBC WITH DIFFERENTIAL/PLATELET
Basophils Absolute: 0 10*3/uL (ref 0.0–0.1)
EOS%: 0.3 % (ref 0.0–7.0)
HGB: 13.4 g/dL (ref 11.6–15.9)
MCH: 29.3 pg (ref 25.1–34.0)
MCHC: 34.1 g/dL (ref 31.5–36.0)
MCV: 86 fL (ref 79.5–101.0)
MONO%: 11.3 % (ref 0.0–14.0)
RDW: 14.1 % (ref 11.2–14.5)

## 2012-12-14 NOTE — Progress Notes (Signed)
Called and spoke with patient concerning her Coumadin dose.  Patient to stay on current dose alternating 3mg  and 4mg .  Pt. Aware.

## 2012-12-15 ENCOUNTER — Other Ambulatory Visit: Payer: Self-pay | Admitting: *Deleted

## 2012-12-15 DIAGNOSIS — I82409 Acute embolism and thrombosis of unspecified deep veins of unspecified lower extremity: Secondary | ICD-10-CM

## 2012-12-15 NOTE — Addendum Note (Signed)
Addended by: Tia Alert A on: 12/15/2012 04:15 PM   Modules accepted: Orders, SmartSet

## 2012-12-16 ENCOUNTER — Other Ambulatory Visit: Payer: Self-pay | Admitting: Oncology

## 2012-12-18 ENCOUNTER — Telehealth: Payer: Self-pay | Admitting: Oncology

## 2012-12-18 NOTE — Telephone Encounter (Signed)
called pt and moved her appt to 12/24

## 2012-12-21 ENCOUNTER — Ambulatory Visit (HOSPITAL_BASED_OUTPATIENT_CLINIC_OR_DEPARTMENT_OTHER): Payer: BC Managed Care – PPO | Admitting: Oncology

## 2012-12-21 ENCOUNTER — Telehealth: Payer: Self-pay | Admitting: *Deleted

## 2012-12-21 ENCOUNTER — Ambulatory Visit: Payer: Self-pay | Admitting: Oncology

## 2012-12-21 VITALS — BP 172/96 | HR 106 | Temp 97.6°F | Resp 20 | Ht 64.0 in | Wt 148.1 lb

## 2012-12-21 DIAGNOSIS — Z170 Estrogen receptor positive status [ER+]: Secondary | ICD-10-CM

## 2012-12-21 DIAGNOSIS — C50419 Malignant neoplasm of upper-outer quadrant of unspecified female breast: Secondary | ICD-10-CM

## 2012-12-21 DIAGNOSIS — Z7901 Long term (current) use of anticoagulants: Secondary | ICD-10-CM

## 2012-12-21 DIAGNOSIS — C50919 Malignant neoplasm of unspecified site of unspecified female breast: Secondary | ICD-10-CM

## 2012-12-21 DIAGNOSIS — Z86718 Personal history of other venous thrombosis and embolism: Secondary | ICD-10-CM

## 2012-12-21 NOTE — Progress Notes (Signed)
Hematology and Oncology Follow Up Visit  Nicole [name] [id number] 1956-06-25 56 y.o. 12/21/2012 12:15 PM   Principle Diagnosis: Locally advanced breast cancer with metastases currently on Femara. She was  diagnosed July 2011. Initial trial with tesataxel, complicated by persistent thrombocytopenia. She has been on Femara for close to 2 years with excellent clinical response. Most recent PET scan performed in February of this year showed minimal if any uptake seen. Status post mastectomy rate followed by radiation therapy completed September 2013. She had some  skin toxicity this is recovered.  Interim History: Medications: I have reviewed the patient's current medications.  Allergies: No Known Allergies  Past Medical History, Surgical history, Social history, and Family History were reviewed and updated.  Review of Systems: Constitutional:  Negative for fever, chills, night sweats, anorexia, weight loss, pain. Cardiovascular: no chest pain or dyspnea on exertion Respiratory: no cough, shortness of breath, or wheezing Neurological: no TIA or stroke symptoms Dermatological: negative ENT: negative Skin Gastrointestinal: no abdominal pain, change in bowel habits, or black or bloody stools Genito-Urinary: no dysuria, trouble voiding, or hematuria Hematological and Lymphatic: negative Breast: . Musculoskeletal: positive for - joint pain Remaining ROS negative.  Physical Exam: Blood pressure 172/96, pulse 106, temperature 97.6 F (36.4 C), resp. rate 20, height 5\' 4"  (1.626 m), weight 148 lb 1.6 oz (67.178 kg). ECOG: 0 General appearance: alert, cooperative and appears stated age Head: Normocephalic, without obvious abnormality, atraumatic Neck: no adenopathy, no carotid bruit, no JVD, supple, symmetrical, trachea midline and thyroid not enlarged, symmetric, no tenderness/mass/nodules Lymph nodes: Cervical, supraclavicular, and axillary nodes normal. Cardiac :  normal Pulmonary:normal Breasts: s/p rt mastectomy., Right chest wall has essentially cleared for radiation effect. Scar is well healed. In the medial portion of the scar there is some area of indurated skin tissue which is palpable and slightly erythematous. There is some small areas similar to this along the scar line. These are nontender. These appear to be unchanged. A picture has been taken. Abdomen: Normal Extremities lt arm swelling  Neuro:  Lab Results: Lab Results  Component Value Date   WBC 3.6* 12/14/2012   HGB 13.4 12/14/2012   HCT 39.3 12/14/2012   MCV 86.0 12/14/2012   PLT 88* 12/14/2012     Chemistry      Component Value Date/Time   NA 140 08/21/2012 1307   K 3.9 08/21/2012 1307   CL 105 08/21/2012 1307   CO2 24 08/21/2012 1307   BUN 15 08/21/2012 1307   CREATININE 0.93 08/21/2012 1307      Component Value Date/Time   CALCIUM 9.2 08/21/2012 1307   ALKPHOS 59 08/21/2012 1307   AST 24 08/21/2012 1307   ALT 30 08/21/2012 1307   BILITOT 0.6 08/21/2012 1307       Radiological Studies: CT - no clear evidence for recurrence, RUL opacity which might be radiation efect. Mammogram n/a Bone density n/a  Impression and Plan:  Stage 4 breast cancer diagnosed 06/2010, s/p neoadjuvant therapy with po tesataxel with minimal response followed by femara .Residual er+ N+ breast cancer s/p mrm 06/02/12. , with excellent response to Femara. She will be seen in early feb/14 for f/u. On ongoing femara therapy.  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  Scar line nodularity, I will have Dr. Dwain Sarna to review this and get his thoughts, when he  sees her in early jan/14.  Pierce Crane M.D. [facility]  More than 50% of the visit was spent in patient-related counselling   Pierce Crane, MD 12/23/201312:15 PM

## 2012-12-21 NOTE — Telephone Encounter (Signed)
Patient has been given the 2014 appointment

## 2012-12-22 ENCOUNTER — Ambulatory Visit: Payer: Self-pay | Admitting: Oncology

## 2012-12-28 ENCOUNTER — Ambulatory Visit (HOSPITAL_BASED_OUTPATIENT_CLINIC_OR_DEPARTMENT_OTHER): Payer: BC Managed Care – PPO | Admitting: Pharmacist

## 2012-12-28 ENCOUNTER — Other Ambulatory Visit: Payer: Self-pay | Admitting: *Deleted

## 2012-12-28 ENCOUNTER — Other Ambulatory Visit (HOSPITAL_BASED_OUTPATIENT_CLINIC_OR_DEPARTMENT_OTHER): Payer: BC Managed Care – PPO | Admitting: Lab

## 2012-12-28 DIAGNOSIS — I82409 Acute embolism and thrombosis of unspecified deep veins of unspecified lower extremity: Secondary | ICD-10-CM

## 2012-12-28 DIAGNOSIS — C50919 Malignant neoplasm of unspecified site of unspecified female breast: Secondary | ICD-10-CM

## 2012-12-28 LAB — CBC WITH DIFFERENTIAL/PLATELET
Basophils Absolute: 0 10*3/uL (ref 0.0–0.1)
EOS%: 0 % (ref 0.0–7.0)
Eosinophils Absolute: 0 10*3/uL (ref 0.0–0.5)
HCT: 39.9 % (ref 34.8–46.6)
HGB: 13.6 g/dL (ref 11.6–15.9)
MCH: 29.2 pg (ref 25.1–34.0)
NEUT%: 69.4 % (ref 38.4–76.8)
RDW: 14.3 % (ref 11.2–14.5)
WBC: 3.8 10*3/uL — ABNORMAL LOW (ref 3.9–10.3)
lymph#: 0.8 10*3/uL — ABNORMAL LOW (ref 0.9–3.3)

## 2012-12-28 LAB — PROTIME-INR
INR: 2.3 (ref 2.00–3.50)
Protime: 27.6 Seconds — ABNORMAL HIGH (ref 10.6–13.4)

## 2012-12-28 LAB — POCT INR: INR: 2.3

## 2012-12-28 NOTE — Progress Notes (Signed)
INR = 2.3 stable on a dose alternation of 4 mg/3 mg daily for jugular DVT. Pt keeps track of her dose by marking on calendar what the took each day. Pt enjoys salads.  I reminded her to keep Vit K content even & consistent w/o drastic changes. Pt has thrombocytopenia; followed by Dr. Donnie Coffin. INR stable for quite some time on current dose.  Cont 4 mg/3 mg alt dosing. Repeat INR in 1 month (01/28/13) If pt needs to be seen before 1 month for CBC to follow pltc, Dr. Donnie Coffin will arrange.  Pt will be due for port flush when she comes 01/28/13.  Angie Fava, RN will arrange.

## 2013-01-01 ENCOUNTER — Ambulatory Visit (INDEPENDENT_AMBULATORY_CARE_PROVIDER_SITE_OTHER): Payer: BC Managed Care – PPO | Admitting: General Surgery

## 2013-01-01 ENCOUNTER — Encounter (INDEPENDENT_AMBULATORY_CARE_PROVIDER_SITE_OTHER): Payer: Self-pay | Admitting: General Surgery

## 2013-01-01 VITALS — BP 130/82 | HR 74 | Temp 97.8°F | Resp 16 | Ht 64.0 in | Wt 147.4 lb

## 2013-01-01 DIAGNOSIS — C50919 Malignant neoplasm of unspecified site of unspecified female breast: Secondary | ICD-10-CM

## 2013-01-01 NOTE — Progress Notes (Signed)
Subjective:     Patient ID: Nicole [name], female   DOB: 1956-03-25, 57 y.o.   MRN: [id number]  HPI This is a 57 year old female I know well after she underwent endocrine therapy for stage IV right breast cancer. She responded very well and eventually underwent a right simple mastectomy for local control. This was followed by radiation therapy. I saw her last time and she was doing well except for some hardness at the medial aspect of her scar. She has u/s below.  She has done well since our last visit and returns today to follow up for this area.  She otherwise has no complaints and recently had 2 scans that are fine.    Review of Systems RIGHT BREAST ULTRASOUND  Comparison: None.  On physical exam, I palpate, a superficial area of nodularity in  the medial aspect of the right mastectomy scar. The patient states  that she thinks it is probably stable since her mastectomy.  Findings: Ultrasound is performed, showing there is a superficial,  hypoechoic lesion in the right chest at the medial aspect of the  scar. It appears to be located within/just below the skin surface.  It measures 1.5 x 0.6 x 0.7 cm.  IMPRESSION:  Probable postoperative changes in the medial aspect of the right  mastectomy scar site.  RECOMMENDATION:  Short-term interval follow-up clinical exam and ultrasound in 6  months is recommended. The importance of self breast examination  was discussed with the patient.  I have discussed the findings and recommendations with the patient.  Results were also provided in writing at the conclusion of the  visit.  BI-RADS CATEGORY 3: Probably benign finding(s) - short interval  follow-up suggested.     Objective:   Physical Exam  Vitals reviewed. Constitutional: She appears well-developed and well-nourished.  Pulmonary/Chest: Right breast exhibits no mass and no tenderness. Left breast exhibits no inverted nipple, no mass, no nipple discharge, no skin change and no  tenderness.    Lymphadenopathy:    She has no cervical adenopathy.    She has no axillary adenopathy.       Right: No supraclavicular adenopathy present.       Left: No supraclavicular adenopathy present.       Assessment:     Stage IV breast cancer     Plan:     I think this is postoperative in nature as the u/s looks also.  We will repeat this at 6 months after last one and I will see her again after that but I don't think she has any local recurrence and she is doing well systemically on the AE now.

## 2013-01-02 ENCOUNTER — Encounter: Payer: Self-pay | Admitting: Oncology

## 2013-01-11 ENCOUNTER — Other Ambulatory Visit: Payer: BC Managed Care – PPO | Admitting: Lab

## 2013-01-20 ENCOUNTER — Telehealth (INDEPENDENT_AMBULATORY_CARE_PROVIDER_SITE_OTHER): Payer: Self-pay

## 2013-01-20 NOTE — Telephone Encounter (Signed)
LMOM giving the appt for the f/u breast u/s with the [facility] on 5/2 arrive at 10:00 for a 10:10.

## 2013-01-24 ENCOUNTER — Other Ambulatory Visit: Payer: Self-pay | Admitting: *Deleted

## 2013-01-24 DIAGNOSIS — C50919 Malignant neoplasm of unspecified site of unspecified female breast: Secondary | ICD-10-CM

## 2013-01-28 ENCOUNTER — Ambulatory Visit: Payer: BC Managed Care – PPO

## 2013-01-28 ENCOUNTER — Other Ambulatory Visit (HOSPITAL_BASED_OUTPATIENT_CLINIC_OR_DEPARTMENT_OTHER): Payer: BC Managed Care – PPO | Admitting: Lab

## 2013-01-28 ENCOUNTER — Ambulatory Visit (HOSPITAL_BASED_OUTPATIENT_CLINIC_OR_DEPARTMENT_OTHER): Payer: BC Managed Care – PPO | Admitting: Pharmacist

## 2013-01-28 VITALS — BP 153/96 | HR 89 | Temp 97.7°F

## 2013-01-28 DIAGNOSIS — I824Z9 Acute embolism and thrombosis of unspecified deep veins of unspecified distal lower extremity: Secondary | ICD-10-CM

## 2013-01-28 DIAGNOSIS — I82409 Acute embolism and thrombosis of unspecified deep veins of unspecified lower extremity: Secondary | ICD-10-CM

## 2013-01-28 DIAGNOSIS — C50419 Malignant neoplasm of upper-outer quadrant of unspecified female breast: Secondary | ICD-10-CM

## 2013-01-28 DIAGNOSIS — C50919 Malignant neoplasm of unspecified site of unspecified female breast: Secondary | ICD-10-CM

## 2013-01-28 LAB — PROTIME-INR
INR: 2.9 (ref 2.00–3.50)
Protime: 34.8 Seconds — ABNORMAL HIGH (ref 10.6–13.4)

## 2013-01-28 LAB — CBC WITH DIFFERENTIAL/PLATELET
BASO%: 0.3 % (ref 0.0–2.0)
EOS%: 0 % (ref 0.0–7.0)
Eosinophils Absolute: 0 10*3/uL (ref 0.0–0.5)
LYMPH%: 19.3 % (ref 14.0–49.7)
MCH: 29.1 pg (ref 25.1–34.0)
MCHC: 33.7 g/dL (ref 31.5–36.0)
MCV: 86.1 fL (ref 79.5–101.0)
MONO%: 11 % (ref 0.0–14.0)
Platelets: 90 10*3/uL — ABNORMAL LOW (ref 145–400)
RBC: 4.68 10*6/uL (ref 3.70–5.45)
RDW: 14 % (ref 11.2–14.5)
nRBC: 0 % (ref 0–0)

## 2013-01-28 LAB — POCT INR: INR: 2.9

## 2013-01-28 MED ORDER — HEPARIN SOD (PORK) LOCK FLUSH 100 UNIT/ML IV SOLN
500.0000 [IU] | Freq: Once | INTRAVENOUS | Status: AC
  Administered 2013-01-28: 500 [IU] via INTRAVENOUS
  Filled 2013-01-28: qty 5

## 2013-01-28 MED ORDER — SODIUM CHLORIDE 0.9 % IJ SOLN
10.0000 mL | INTRAMUSCULAR | Status: DC | PRN
  Administered 2013-01-28: 10 mL via INTRAVENOUS
  Filled 2013-01-28: qty 10

## 2013-01-28 NOTE — Patient Instructions (Signed)
Continue current dose; 3mg alternating with 4mg. Repeat INR 02/25/13 at 9 am for lab; 9:15 am for Coumadin clinic. 

## 2013-01-28 NOTE — Progress Notes (Signed)
Continue current dose; 3mg  alternating with 4mg . Repeat INR 02/25/13 at 9 am for lab; 9:15 am for Coumadin clinic.

## 2013-01-28 NOTE — Patient Instructions (Signed)
Call MD for problems or concerns 

## 2013-02-13 ENCOUNTER — Other Ambulatory Visit: Payer: Self-pay | Admitting: Oncology

## 2013-02-13 DIAGNOSIS — I82409 Acute embolism and thrombosis of unspecified deep veins of unspecified lower extremity: Secondary | ICD-10-CM

## 2013-02-15 ENCOUNTER — Other Ambulatory Visit: Payer: Self-pay | Admitting: *Deleted

## 2013-02-15 DIAGNOSIS — I82409 Acute embolism and thrombosis of unspecified deep veins of unspecified lower extremity: Secondary | ICD-10-CM

## 2013-02-15 MED ORDER — WARFARIN SODIUM 2 MG PO TABS: 2.0000 mg | ORAL_TABLET | ORAL | Status: DC

## 2013-02-25 ENCOUNTER — Other Ambulatory Visit (HOSPITAL_BASED_OUTPATIENT_CLINIC_OR_DEPARTMENT_OTHER): Payer: BC Managed Care – PPO | Admitting: Lab

## 2013-02-25 ENCOUNTER — Ambulatory Visit (HOSPITAL_BASED_OUTPATIENT_CLINIC_OR_DEPARTMENT_OTHER): Payer: BC Managed Care – PPO | Admitting: Pharmacist

## 2013-02-25 DIAGNOSIS — C50419 Malignant neoplasm of upper-outer quadrant of unspecified female breast: Secondary | ICD-10-CM

## 2013-02-25 DIAGNOSIS — I82409 Acute embolism and thrombosis of unspecified deep veins of unspecified lower extremity: Secondary | ICD-10-CM

## 2013-02-25 DIAGNOSIS — C50919 Malignant neoplasm of unspecified site of unspecified female breast: Secondary | ICD-10-CM

## 2013-02-25 LAB — CBC WITH DIFFERENTIAL/PLATELET
Basophils Absolute: 0 10*3/uL (ref 0.0–0.1)
EOS%: 0 % (ref 0.0–7.0)
HCT: 40.5 % (ref 34.8–46.6)
HGB: 13.9 g/dL (ref 11.6–15.9)
LYMPH%: 23.5 % (ref 14.0–49.7)
MCH: 29.1 pg (ref 25.1–34.0)
MONO#: 0.3 10*3/uL (ref 0.1–0.9)
NEUT%: 66.5 % (ref 38.4–76.8)
Platelets: 76 10*3/uL — ABNORMAL LOW (ref 145–400)
lymph#: 0.7 10*3/uL — ABNORMAL LOW (ref 0.9–3.3)

## 2013-02-25 LAB — PROTIME-INR

## 2013-02-25 NOTE — Progress Notes (Signed)
No changes to report.  Pt trying to eat healthier. She will have her PAC flushed with MD appmt on 03/22/13 Continue current dose; 3mg  alternating with 4mg . Repeat INR 03/22/13 at 9 am for lab; 9:15 am for Coumadin clinic and MD at 9:30

## 2013-02-25 NOTE — Patient Instructions (Signed)
Continue current dose; 3mg  alternating with 4mg . Repeat INR 03/22/13 at 9 am for lab; 9:15 am for Coumadin clinic

## 2013-03-22 ENCOUNTER — Telehealth: Payer: Self-pay | Admitting: *Deleted

## 2013-03-22 ENCOUNTER — Ambulatory Visit: Payer: BC Managed Care – PPO | Admitting: Pharmacist

## 2013-03-22 ENCOUNTER — Ambulatory Visit (HOSPITAL_BASED_OUTPATIENT_CLINIC_OR_DEPARTMENT_OTHER): Payer: BC Managed Care – PPO | Admitting: Oncology

## 2013-03-22 ENCOUNTER — Other Ambulatory Visit (HOSPITAL_BASED_OUTPATIENT_CLINIC_OR_DEPARTMENT_OTHER): Payer: BC Managed Care – PPO | Admitting: Lab

## 2013-03-22 VITALS — BP 138/91 | HR 92 | Temp 98.6°F | Resp 20 | Ht 64.0 in | Wt 148.7 lb

## 2013-03-22 DIAGNOSIS — C50419 Malignant neoplasm of upper-outer quadrant of unspecified female breast: Secondary | ICD-10-CM

## 2013-03-22 DIAGNOSIS — I82409 Acute embolism and thrombosis of unspecified deep veins of unspecified lower extremity: Secondary | ICD-10-CM

## 2013-03-22 DIAGNOSIS — C50919 Malignant neoplasm of unspecified site of unspecified female breast: Secondary | ICD-10-CM

## 2013-03-22 DIAGNOSIS — I82C19 Acute embolism and thrombosis of unspecified internal jugular vein: Secondary | ICD-10-CM

## 2013-03-22 DIAGNOSIS — C771 Secondary and unspecified malignant neoplasm of intrathoracic lymph nodes: Secondary | ICD-10-CM

## 2013-03-22 DIAGNOSIS — C7800 Secondary malignant neoplasm of unspecified lung: Secondary | ICD-10-CM

## 2013-03-22 LAB — CBC WITH DIFFERENTIAL/PLATELET
Eosinophils Absolute: 0 10*3/uL (ref 0.0–0.5)
HCT: 39.3 % (ref 34.8–46.6)
LYMPH%: 26.7 % (ref 14.0–49.7)
MCV: 84.3 fL (ref 79.5–101.0)
MONO#: 0.3 10*3/uL (ref 0.1–0.9)
MONO%: 10.3 % (ref 0.0–14.0)
NEUT#: 2 10*3/uL (ref 1.5–6.5)
NEUT%: 62.1 % (ref 38.4–76.8)
Platelets: 83 10*3/uL — ABNORMAL LOW (ref 145–400)
WBC: 3.3 10*3/uL — ABNORMAL LOW (ref 3.9–10.3)

## 2013-03-22 LAB — PROTIME-INR

## 2013-03-22 LAB — POCT INR: INR: 2.8

## 2013-03-22 NOTE — Progress Notes (Signed)
INR at goal this morning at patient has been stable on this current dose. No missed doses or bleeding/bruising noted by patient. Diet has been stable. Pt is seeing Dr. Darnelle Catalan this am as well. Plan is to continue same dose of coumadin 3mg  alternating with 4 mg daily. Pt will return on 04/19/13 in 1 month for INR recheck at 9am and coumadin clinic at 9:15.

## 2013-03-22 NOTE — Telephone Encounter (Signed)
appts made and printed 

## 2013-03-22 NOTE — Patient Instructions (Addendum)
INR at goal No changes Continue same dose of 3 mg alternating with 4 mg and return in 1 month on 4/21 at 9am

## 2013-03-22 NOTE — Progress Notes (Signed)
ID: Nicole [name]   DOB: 14-May-1956  MR#: [id number]  AOZ#:[id number]  PCP:  GYN:  SU: [name] OTHER MD: Antony Blackbird   HISTORY OF PRESENT ILLNESS: From Dr. Doreatha Massed 07/25/2010 note:  "This woman has not had any significant medical intervention for some time.  Her last mammogram was about seven years ago.  She noted a right breast mass about a year ago and did not seek immediate medical attention for this.  She ultimately, after some time, admitted this to her husband and self-referred herself for intervention.  She had a mammogram on 07/17/2010 with an ultrasound of the right breast.  This showed a lobulated mass upper right outer quadrant measuring at least 6.3 x 7.3 cm and large right axillary lymph nodes were also noted.  There was skin thickening overlying the mass.  On physical exam, this mass was about an 8 cm with skin dimpling noted. Discolored area in the skin over the mass, fullness in the axilla.  Biopsy was recommended, which took place on 07/17/2010.  Pathology showed invasive ductal cancer involving both lymph node and breast.  The HER-2 was not amplified.  ER and PR both positive at 100% and 6% respectively.  Proliferative index was 12% involving both the lymph node and breast mass.  An MRI scan of both breasts was performed on 07/22/2010, essentially which showed a large heterogeneously enhancing mass of the right breast with washout kinetics measuring 6.4 x 4.4 x 5.0 cm.  Numerous satellite nodules were seen throughout the dominant mass.  There were several suspicious nodules in the upper central and upper inner quadrant of the right breast.  There were two enhancing subcentimeter nodules seen in the right pectoralis muscle.  No obvious chest wall nodules were seen; however, there was seen some metastatic adenopathy in the mediastinum as well as hila with a 2.4 x 2.0 cm mediastinal lymph node as well as 1.2 x 3.0 cm subcarinal lymph node.  There was a right hilar and  probable left hilar adenopathy.  Bilateral pulmonary nodules were seen and a right T2 lesion was seen anterior right liver as well."  Her subsequent history is as detailed below.  INTERVAL HISTORY: [name] returns for followup of her breast cancer accompanied by her husband [name]. She is establishing herself on my service today  REVIEW OF SYSTEMS: Charne is tolerating the letrozole well. She is not having significant problems with hot flashes or vaginal dryness. She denies unexplained fatigue or weight loss. She has some sinus problems, a history of a heart murmur, some joint pains "here and there", and a history of depression. There have been no bleeding or other complications from her Coumadin.  A detailed review of systems today was otherwise noncontributory PAST MEDICAL HISTORY: Past Medical History  Diagnosis Date  . IBD (inflammatory bowel disease)     resolved  . Heart murmur     mild, no cardiologist  . Blood transfusion 2012  . Breast CA     breast ca dx 06/2010, stage 4, right   . Radiation 07/21/12-09/07/12    5940 cGy    PAST SURGICAL HISTORY: Past Surgical History  Procedure Laterality Date  . Portacath placement  2011    left side  . Dilation and curettage of uterus  2004?  [name] Mastectomy  06/02/12    right breast    FAMILY HISTORY Family History  Problem Relation Age of Onset  . Cancer Neg Hx    patient's father died at the  age of 41 from heart disease. Patient's mother died at the age of 25 status post CABG. The patient had no brothers, 2 sisters. There is no history of breast or ovarian cancer in the family.  GYNECOLOGIC HISTORY: Menarche age 18, she is GX P0. She does not recall when she went through menopause. She never took hormone replacement.  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 Husband [name] is retired from [employer]. They have no children and no pets. They attend a local [address].   ADVANCED DIRECTIVES: Not  in place  HEALTH MAINTENANCE: History  Substance Use Topics  . Smoking status: Never Smoker   . Smokeless tobacco: Never Used  . Alcohol Use: No     Colonoscopy:  PAP:  Bone density:  Lipid panel:  No Known Allergies  Current Outpatient Prescriptions  Medication Sig Dispense Refill  . alendronate (FOSAMAX) 70 MG tablet TAKE 1 TABLET ONCE A WEEK  12 tablet  1  . Calcium Carbonate (CALTRATE 600 PO) Take by mouth 2 (two) times daily.       . Cholecalciferol (VITAMIN D PO) Take 2,000 Units by mouth once.       [name] letrozole (FEMARA) 2.5 MG tablet TAKE 1 TABLET BY MOUTH EVERY DAY  30 tablet  5  . venlafaxine XR (EFFEXOR-XR) 75 MG 24 hr capsule TAKE 1 CAPSULE (75 MG TOTAL) BY MOUTH EVERY EVENING.  90 capsule  1  . warfarin (COUMADIN) 2 MG tablet Take 1 tablet (2 mg total) by mouth every other day. Take two (2 mg) tablets every other day alternating with 5 mg tablets  60 tablet  0   No current facility-administered medications for this visit.    OBJECTIVE: Middle-aged woman in no acute distress Filed Vitals:   03/22/13 0953  BP: 138/91  Pulse: 92  Temp: 98.6 F (37 C)  Resp: 20     Body mass index is 25.51 kg/(m^2).    ECOG FS: 0  Sclerae unicteric Oropharynx clear No cervical or supraclavicular adenopathy Lungs no rales or rhonchi Heart regular rate and rhythm, no murmur appreciated Abd benign MSK no focal spinal tenderness Neuro: nonfocal, well oriented, appropriate affect Breasts: The right breast is status post mastectomy. There is no evidence of local recurrence. The right axilla is benign. The left breast is unremarkable  LAB RESULTS: Lab Results  Component Value Date   WBC 3.3* 03/22/2013   NEUTROABS 2.0 03/22/2013   HGB 13.4 03/22/2013   HCT 39.3 03/22/2013   MCV 84.3 03/22/2013   PLT 83* 03/22/2013      Chemistry      Component Value Date/Time   NA 140 08/21/2012 1307   K 3.9 08/21/2012 1307   CL 105 08/21/2012 1307   CO2 24 08/21/2012 1307   BUN 15 08/21/2012  1307   CREATININE 0.93 08/21/2012 1307      Component Value Date/Time   CALCIUM 9.2 08/21/2012 1307   ALKPHOS 59 08/21/2012 1307   AST 24 08/21/2012 1307   ALT 30 08/21/2012 1307   BILITOT 0.6 08/21/2012 1307       Lab Results  Component Value Date   LABCA2 18 08/21/2012    No components found with this basename: [id number]     Recent Labs Lab 03/22/13 0905  INR 2.80    Urinalysis No results found for this basename: colorurine,  appearanceur,  labspec,  phurine,  glucoseu,  hgbur,  bilirubinur,  ketonesur,  proteinur,  urobilinogen,  nitrite,  leukocytesur    STUDIES: Dg Chest 2 View  03/26/2013  *RADIOLOGY REPORT*  Clinical Data: Right breast carcinoma  CHEST - 2 VIEW  Comparison: 06/16/2012  Findings: The heart and pulmonary vascularity are stable.  A left chest wall port is again seen and stable.  Postsurgical changes are noted on the right.  The lungs are clear bilaterally.  IMPRESSION: Postsurgical changes.  No acute abnormality is noted.   Original Report Authenticated By: Alcide Clever, M.D.    Note that PET scan 02/12/2012 showed no "hot" areas (original PET scan 07/26/2010 had showed strong uptake in all the areas of clinical involvement described). Chest CT 12/02/2012 showed resolution of the lung lesions and adenopathy, with some groundglass consolidation in the right upper and middle lobes of unclear significance.  ASSESSMENT: 57 y.o. [place] woman presenting June 2011with stage IV breast cancer involving the right breast, right axilla, mediastinal lymph nodes, and both lungs, but not the brain, liver, or bones  (1) positive right breast and right axillary lymph node biopsies 07/17/2010 of a clinical T4 N2 M1 invasive ductal carcinoma, grade 1, estrogen receptor 100% positive, progesterone receptor 6% positive, with an MIB-1 of 12%, and no HER-2 amplification.  (2) participated in Phase II tessetaxel study, receiving 2 cycles complicated by thrombocytopenia, anemia  requiring transfusion, transaminase elevation, and afebrile neutropenia. Off-study as of September 2011. Chest CT scan September 2011 did show evidence of response.  (3) on letrozole as of October 2011, with continuing response  (4) 06/02/2012 underwent right mastectomy and axillary lymph node sampling (3 lymph nodes removed, all with viable cancer as well as evidence of treatment effect) for a ypT2 ypN1-2 invasive ductal carcinoma, grade 1, 98% estrogen receptor positive, progesterone receptor negative, with no HER-2 amplification  (5) left jugular vein DVT documented 06/16/2012; left sided Port-A-Cath in place  PLAN: Imonie is doing remarkably well on the letrozole, and the plan is to continue that medication until we have evidence of disease progression. I am going to obtain a chest x-ray for followup, and continue that every 3 months in the absence of symptoms. We can consider a CT of the chest once a year for more details followup. As far as the DVT is concerned, I would be comfortable stopping Coumadin 2 months after pulling the port. For now she prefers to keep the port in place, so we have set up for a every 6 weeks flush schedule. Of course she will continue to come to the Coumadin clinic on a once a month basis. She will see [place] again in 3 months. She is a ready scheduled for   MAGRINAT,GUSTAV C    03/28/2013

## 2013-03-26 ENCOUNTER — Ambulatory Visit (HOSPITAL_COMMUNITY)
Admission: RE | Admit: 2013-03-26 | Discharge: 2013-03-26 | Disposition: A | Discharge status: Home / Self Care | Payer: BC Managed Care – PPO | Source: Ambulatory Visit | Attending: Oncology | Admitting: Oncology

## 2013-03-26 DIAGNOSIS — Z9010 Acquired absence of unspecified breast and nipple: Secondary | ICD-10-CM | POA: Insufficient documentation

## 2013-03-26 DIAGNOSIS — I82409 Acute embolism and thrombosis of unspecified deep veins of unspecified lower extremity: Secondary | ICD-10-CM

## 2013-03-26 DIAGNOSIS — C50919 Malignant neoplasm of unspecified site of unspecified female breast: Secondary | ICD-10-CM

## 2013-03-26 DIAGNOSIS — C7800 Secondary malignant neoplasm of unspecified lung: Secondary | ICD-10-CM | POA: Insufficient documentation

## 2013-03-28 ENCOUNTER — Other Ambulatory Visit: Payer: Self-pay | Admitting: Oncology

## 2013-04-08 ENCOUNTER — Ambulatory Visit
Admission: RE | Admit: 2013-04-08 | Discharge: 2013-04-08 | Disposition: A | Discharge status: Home / Self Care | Payer: BC Managed Care – PPO | Source: Ambulatory Visit | Attending: Radiation Oncology | Admitting: Radiation Oncology

## 2013-04-08 ENCOUNTER — Encounter: Payer: Self-pay | Admitting: Radiation Oncology

## 2013-04-08 VITALS — BP 150/92 | HR 91 | Temp 97.9°F | Resp 18 | Wt 147.1 lb

## 2013-04-08 DIAGNOSIS — C50911 Malignant neoplasm of unspecified site of right female breast: Secondary | ICD-10-CM

## 2013-04-08 NOTE — Progress Notes (Signed)
Patient presents to the clinic today accompanied by her husband for follow up appointment with Dr. Roselind Messier. Patient is alert and oriented to person, place, and time. No distress noted. Steady gait noted. Pleasant affect noted. Patient denies pain at this time. No edema noted in either upper extremities. Patient denies numbness or tingling in either upper extremity. Patient denies nipple discharge or bleeding. Patient denies right breast pain. Patient denies full ROM of both upper extremities. Patient reports mild fatigue. Patient scheduled for ultrasound and mammogram on 5/2. Patient reports that she had a chest xray two weeks ago. Patient is a former patient of Dr. Donnie Coffin switched to Dr. Darnelle Catalan. Patient reports occasional mild hot flashes. Reported all findings to Dr. Roselind Messier.

## 2013-04-08 NOTE — Progress Notes (Signed)
Radiation Oncology         [phone]) [phone] ________________________________  Name: Nicole [name] MRN: [id number]  Date: 04/08/2013  DOB: 12/18/1956  Follow-Up Visit Note  CC: Lowella Dell, MD  Pierce Crane, MD  Diagnosis:   Stage IV right breast cancer  Interval Since Last Radiation:  7  months  Narrative:  The patient returns today for routine follow-up.  She is doing well and without complaints. She is taking Femara and seems to be tolerating this well. She is following up with Dr. Darnelle Catalan at this time and continues close followup with Dr. Dwain Sarna. She denies any itching or discomfort along the right chest wall area. She denies any problems with swelling in her right arm or hand.                              ALLERGIES:  has No Known Allergies.  Meds: Current Outpatient Prescriptions  Medication Sig Dispense Refill  . alendronate (FOSAMAX) 70 MG tablet TAKE 1 TABLET ONCE A WEEK  12 tablet  1  . Calcium Carbonate (CALTRATE 600 PO) Take by mouth 2 (two) times daily.       . Cholecalciferol (VITAMIN D PO) Take 2,000 Units by mouth once.       [name] letrozole (FEMARA) 2.5 MG tablet TAKE 1 TABLET BY MOUTH EVERY DAY  30 tablet  5  . venlafaxine XR (EFFEXOR-XR) 75 MG 24 hr capsule TAKE 1 CAPSULE (75 MG TOTAL) BY MOUTH EVERY EVENING.  90 capsule  1  . warfarin (COUMADIN) 2 MG tablet Take 1 tablet (2 mg total) by mouth every other day. Take two (2 mg) tablets every other day alternating with 5 mg tablets  60 tablet  0   No current facility-administered medications for this encounter.    Physical Findings: The patient is in no acute distress. Patient is alert and oriented.  weight is 147 lb 1.6 oz (66.724 kg). Her oral temperature is 97.9 F (36.6 C). Her blood pressure is 150/92 and her pulse is 91. Her respiration is 18 and oxygen saturation is 100%. .  No palpable supraclavicular or axillary adenopathy. The lungs are clear to auscultation. The heart has a regular rhythm and rate.  Examination of the left breast reveals no mass or nipple discharge. Examination of the right chest wall area reveals some telangiectasias along the upper aspect of the chest wall area. There is no palpable or visible signs of recurrence.  Lab Findings: Lab Results  Component Value Date   WBC 3.3* 03/22/2013   HGB 13.4 03/22/2013   HCT 39.3 03/22/2013   MCV 84.3 03/22/2013   PLT 83* 03/22/2013    @LASTCHEM @  Radiographic Findings: Dg Chest 2 View  03/26/2013  *RADIOLOGY REPORT*  Clinical Data: Right breast carcinoma  CHEST - 2 VIEW  Comparison: 06/16/2012  Findings: The heart and pulmonary vascularity are stable.  A left chest wall port is again seen and stable.  Postsurgical changes are noted on the right.  The lungs are clear bilaterally.  IMPRESSION: Postsurgical changes.  No acute abnormality is noted.   Original Report Authenticated By: Alcide Clever, M.D.     Impression:  The patient is recovering from the effects of radiation.  No evidence of recurrence on clinical exam today.  Plan:  When necessary followup in radiation oncology. The patient will continue close followup with Dr. Dwain Sarna and Dr. Darnelle Catalan.  _____________________________________  -----------------------------------  Billie Lade,  PhD, MD

## 2013-04-09 ENCOUNTER — Other Ambulatory Visit: Payer: Self-pay | Admitting: Oncology

## 2013-04-15 ENCOUNTER — Telehealth: Payer: Self-pay | Admitting: *Deleted

## 2013-04-15 NOTE — Telephone Encounter (Signed)
Pt called to r/s her appt for 4/21 due to she will be out of town...gv appt for 4/22...td

## 2013-04-19 ENCOUNTER — Other Ambulatory Visit: Payer: BC Managed Care – PPO | Admitting: Lab

## 2013-04-19 ENCOUNTER — Ambulatory Visit: Payer: BC Managed Care – PPO

## 2013-04-19 ENCOUNTER — Encounter: Payer: Self-pay | Admitting: Lab

## 2013-04-20 ENCOUNTER — Other Ambulatory Visit (HOSPITAL_BASED_OUTPATIENT_CLINIC_OR_DEPARTMENT_OTHER): Payer: BC Managed Care – PPO | Admitting: Lab

## 2013-04-20 ENCOUNTER — Ambulatory Visit: Payer: BC Managed Care – PPO | Admitting: Pharmacist

## 2013-04-20 DIAGNOSIS — I82402 Acute embolism and thrombosis of unspecified deep veins of left lower extremity: Secondary | ICD-10-CM

## 2013-04-20 DIAGNOSIS — I82409 Acute embolism and thrombosis of unspecified deep veins of unspecified lower extremity: Secondary | ICD-10-CM

## 2013-04-20 DIAGNOSIS — I82C19 Acute embolism and thrombosis of unspecified internal jugular vein: Secondary | ICD-10-CM

## 2013-04-20 DIAGNOSIS — C50919 Malignant neoplasm of unspecified site of unspecified female breast: Secondary | ICD-10-CM

## 2013-04-20 DIAGNOSIS — C50419 Malignant neoplasm of upper-outer quadrant of unspecified female breast: Secondary | ICD-10-CM

## 2013-04-20 LAB — CBC WITH DIFFERENTIAL/PLATELET
Basophils Absolute: 0 10*3/uL (ref 0.0–0.1)
EOS%: 0.1 % (ref 0.0–7.0)
Eosinophils Absolute: 0 10*3/uL (ref 0.0–0.5)
HGB: 13.8 g/dL (ref 11.6–15.9)
MONO#: 0.3 10*3/uL (ref 0.1–0.9)
NEUT#: 2.2 10*3/uL (ref 1.5–6.5)
RDW: 15.3 % — ABNORMAL HIGH (ref 11.2–14.5)
WBC: 3.2 10*3/uL — ABNORMAL LOW (ref 3.9–10.3)
lymph#: 0.7 10*3/uL — ABNORMAL LOW (ref 0.9–3.3)

## 2013-04-20 LAB — COMPREHENSIVE METABOLIC PANEL (CC13)
ALT: 36 U/L (ref 0–55)
AST: 26 U/L (ref 5–34)
Calcium: 9.2 mg/dL (ref 8.4–10.4)
Chloride: 109 mEq/L — ABNORMAL HIGH (ref 98–107)
Creatinine: 1 mg/dL (ref 0.6–1.1)
Potassium: 4 mEq/L (ref 3.5–5.1)
Sodium: 140 mEq/L (ref 136–145)

## 2013-04-20 LAB — PROTIME-INR: INR: 2.2 (ref 2.00–3.50)

## 2013-04-20 NOTE — Progress Notes (Signed)
INR within goal today. No problems regarding anticoagulation to report. No bleeding nor bruising. No s/s of clotting. Continue current dose; 3mg  alternating with 4mg . Repeat INR on 06/07/13 at 9:30 am for Flush, 10am for Lab and 10:15 am for Coumadin clinic.

## 2013-04-20 NOTE — Patient Instructions (Addendum)
Continue current dose; 3mg  alternating with 4mg . Repeat INR on 06/07/13 at 9:30 am for Flush, 10am for Lab and 10:15 am for Coumadin clinic. Call us with any questions or concerns before your next visit.

## 2013-04-21 ENCOUNTER — Other Ambulatory Visit: Payer: Self-pay | Admitting: Oncology

## 2013-04-21 DIAGNOSIS — I82409 Acute embolism and thrombosis of unspecified deep veins of unspecified lower extremity: Secondary | ICD-10-CM

## 2013-04-21 DIAGNOSIS — C50919 Malignant neoplasm of unspecified site of unspecified female breast: Secondary | ICD-10-CM

## 2013-04-26 ENCOUNTER — Ambulatory Visit (HOSPITAL_BASED_OUTPATIENT_CLINIC_OR_DEPARTMENT_OTHER): Payer: BC Managed Care – PPO

## 2013-04-26 VITALS — BP 151/86 | HR 76 | Temp 98.0°F

## 2013-04-26 DIAGNOSIS — Z452 Encounter for adjustment and management of vascular access device: Secondary | ICD-10-CM

## 2013-04-26 DIAGNOSIS — C50919 Malignant neoplasm of unspecified site of unspecified female breast: Secondary | ICD-10-CM

## 2013-04-26 DIAGNOSIS — C50419 Malignant neoplasm of upper-outer quadrant of unspecified female breast: Secondary | ICD-10-CM

## 2013-04-26 MED ORDER — HEPARIN SOD (PORK) LOCK FLUSH 100 UNIT/ML IV SOLN
500.0000 [IU] | Freq: Once | INTRAVENOUS | Status: AC
  Administered 2013-04-26: 500 [IU] via INTRAVENOUS
  Filled 2013-04-26: qty 5

## 2013-04-26 MED ORDER — SODIUM CHLORIDE 0.9 % IJ SOLN
10.0000 mL | INTRAMUSCULAR | Status: DC | PRN
  Administered 2013-04-26: 10 mL via INTRAVENOUS
  Filled 2013-04-26: qty 10

## 2013-04-30 ENCOUNTER — Other Ambulatory Visit: Payer: BC Managed Care – PPO

## 2013-05-03 ENCOUNTER — Ambulatory Visit
Admission: RE | Admit: 2013-05-03 | Discharge: 2013-05-03 | Disposition: A | Discharge status: Home / Self Care | Payer: BC Managed Care – PPO | Source: Ambulatory Visit | Attending: General Surgery | Admitting: General Surgery

## 2013-05-03 ENCOUNTER — Other Ambulatory Visit: Payer: BC Managed Care – PPO

## 2013-05-03 DIAGNOSIS — C50919 Malignant neoplasm of unspecified site of unspecified female breast: Secondary | ICD-10-CM

## 2013-06-04 ENCOUNTER — Other Ambulatory Visit: Payer: Self-pay | Admitting: *Deleted

## 2013-06-04 DIAGNOSIS — I82409 Acute embolism and thrombosis of unspecified deep veins of unspecified lower extremity: Secondary | ICD-10-CM

## 2013-06-06 IMAGING — CR DG CHEST 2V
2 series · 2 of 2 positions shown · non-contrast
Comparison: 06/16/2012

CLINICAL DATA: Right breast carcinoma

CHEST - 2 VIEW

[w chest pa]
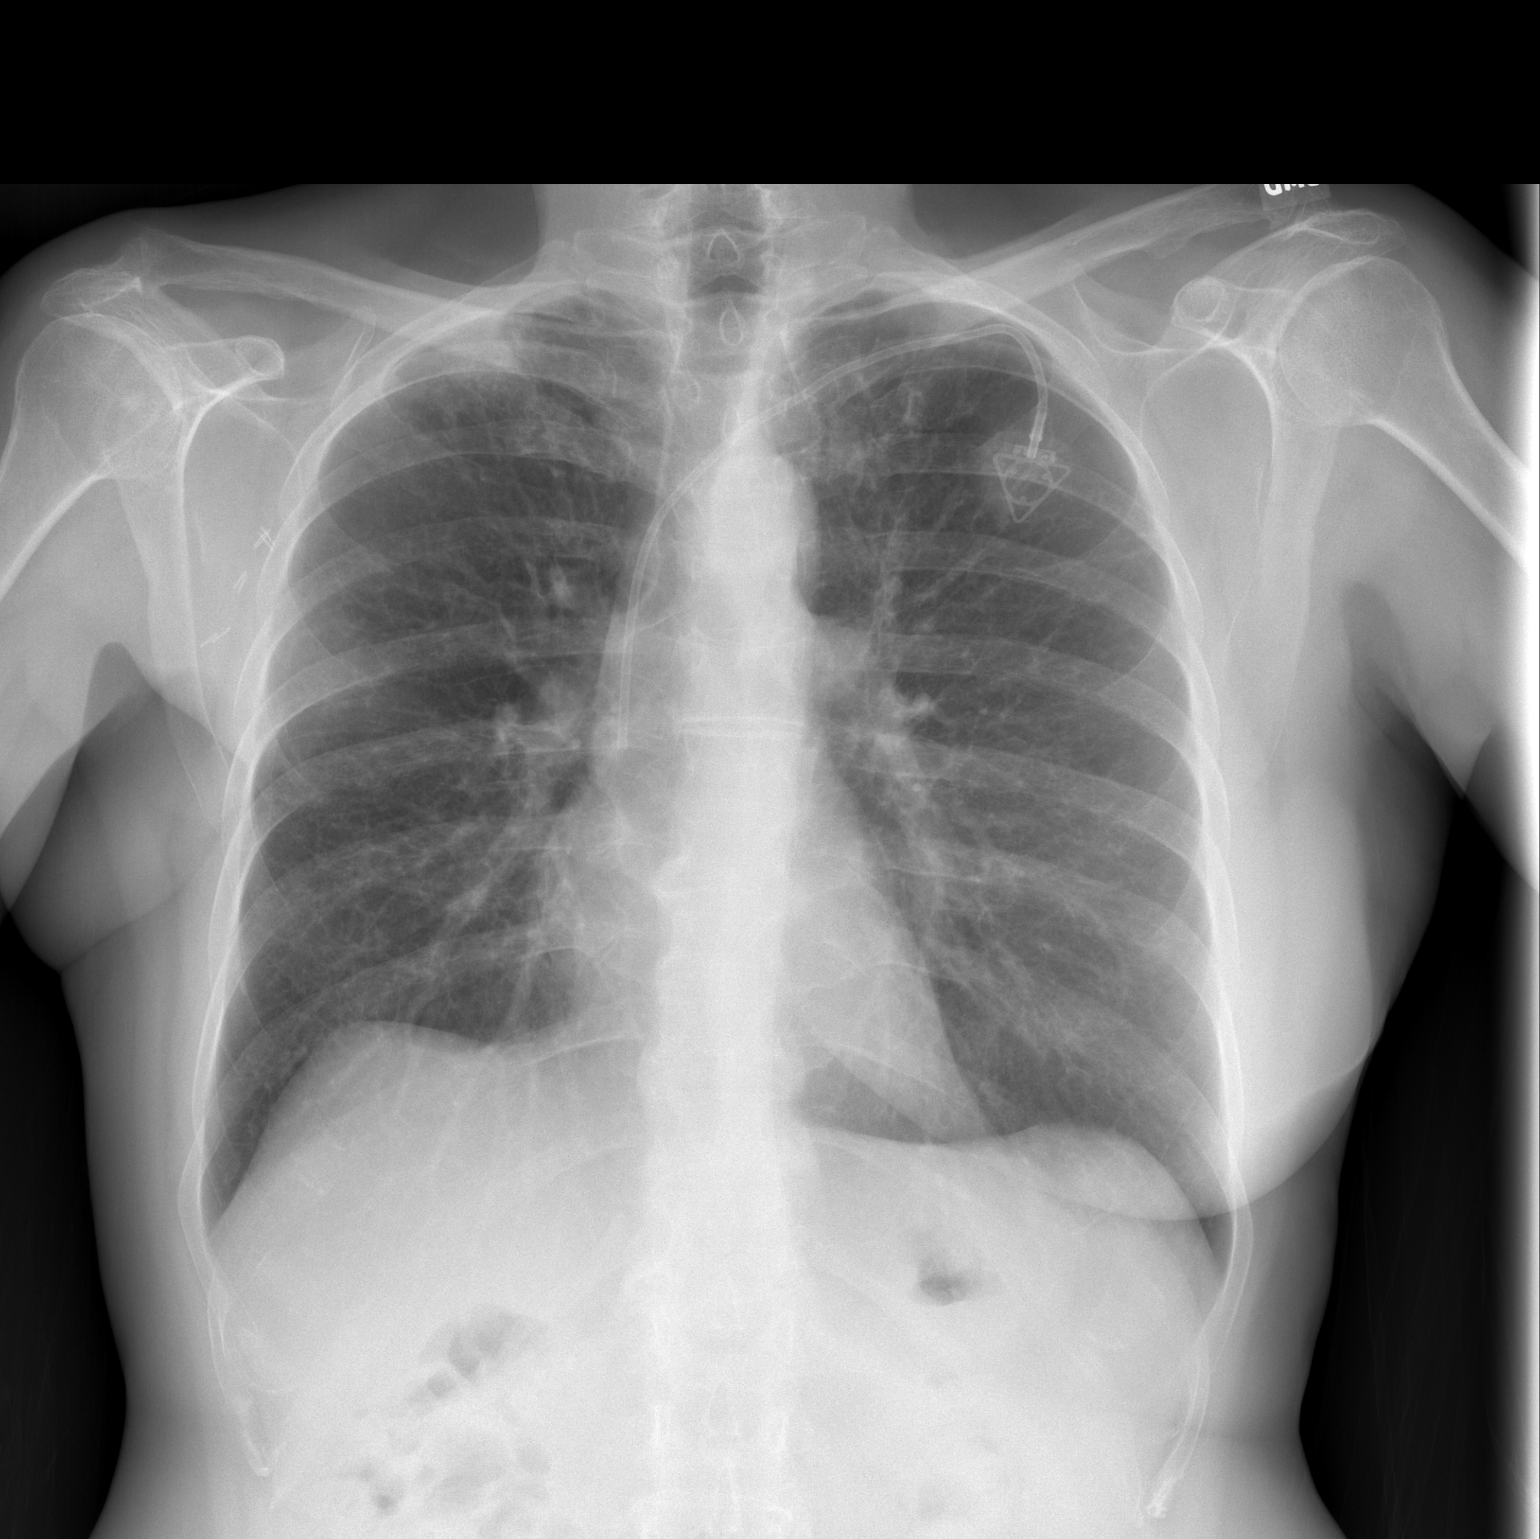

[w chest lat]
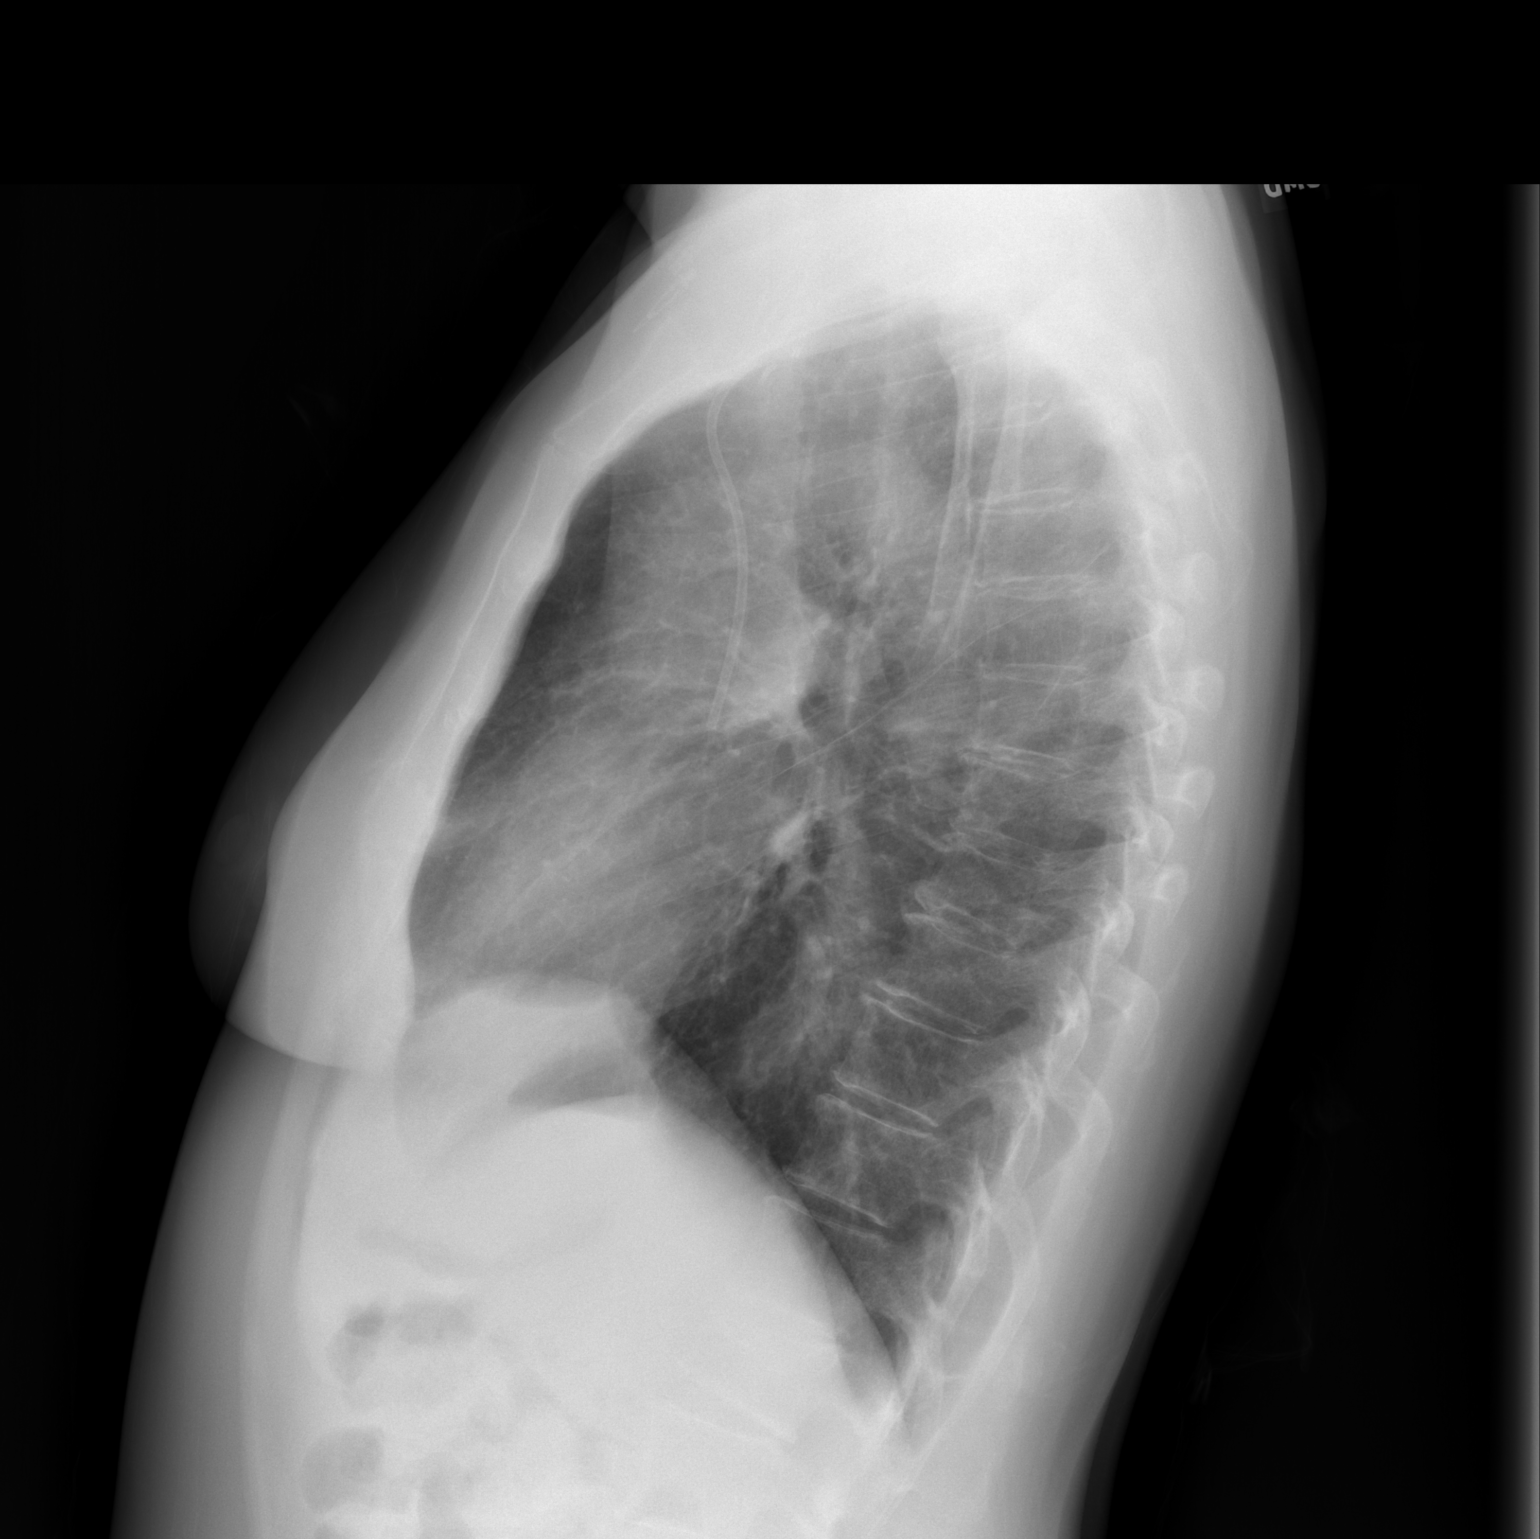

[2 of 2 positions shown; findings below may reference images not displayed]

FINDINGS: The heart and pulmonary vascularity are stable.  A left
chest wall port is again seen and stable.  Postsurgical changes are
noted on the right.  The lungs are clear bilaterally.
IMPRESSION: Postsurgical changes.  No acute abnormality is noted.

## 2013-06-07 ENCOUNTER — Other Ambulatory Visit (HOSPITAL_BASED_OUTPATIENT_CLINIC_OR_DEPARTMENT_OTHER): Payer: BC Managed Care – PPO

## 2013-06-07 ENCOUNTER — Ambulatory Visit (HOSPITAL_BASED_OUTPATIENT_CLINIC_OR_DEPARTMENT_OTHER): Payer: BC Managed Care – PPO

## 2013-06-07 ENCOUNTER — Ambulatory Visit (HOSPITAL_BASED_OUTPATIENT_CLINIC_OR_DEPARTMENT_OTHER): Payer: BC Managed Care – PPO | Admitting: Pharmacist

## 2013-06-07 VITALS — BP 159/88 | HR 91 | Temp 98.8°F

## 2013-06-07 DIAGNOSIS — D0590 Unspecified type of carcinoma in situ of unspecified breast: Secondary | ICD-10-CM

## 2013-06-07 DIAGNOSIS — C50919 Malignant neoplasm of unspecified site of unspecified female breast: Secondary | ICD-10-CM

## 2013-06-07 DIAGNOSIS — I82409 Acute embolism and thrombosis of unspecified deep veins of unspecified lower extremity: Secondary | ICD-10-CM

## 2013-06-07 LAB — PROTIME-INR
INR: 2.6 (ref 2.00–3.50)
Protime: 31.2 Seconds — ABNORMAL HIGH (ref 10.6–13.4)

## 2013-06-07 LAB — CBC WITH DIFFERENTIAL/PLATELET
BASO%: 0.3 % (ref 0.0–2.0)
Basophils Absolute: 0 10*3/uL (ref 0.0–0.1)
EOS%: 0 % (ref 0.0–7.0)
HGB: 13.5 g/dL (ref 11.6–15.9)
MCH: 29.7 pg (ref 25.1–34.0)
MCHC: 35 g/dL (ref 31.5–36.0)
MONO#: 0.3 10*3/uL (ref 0.1–0.9)
RDW: 14.1 % (ref 11.2–14.5)
WBC: 3.2 10*3/uL — ABNORMAL LOW (ref 3.9–10.3)
lymph#: 0.7 10*3/uL — ABNORMAL LOW (ref 0.9–3.3)

## 2013-06-07 MED ORDER — SODIUM CHLORIDE 0.9 % IJ SOLN
10.0000 mL | INTRAMUSCULAR | Status: DC | PRN
  Administered 2013-06-07: 10 mL via INTRAVENOUS
  Filled 2013-06-07: qty 10

## 2013-06-07 MED ORDER — HEPARIN SOD (PORK) LOCK FLUSH 100 UNIT/ML IV SOLN
500.0000 [IU] | Freq: Once | INTRAVENOUS | Status: AC
  Administered 2013-06-07: 500 [IU] via INTRAVENOUS
  Filled 2013-06-07: qty 5

## 2013-06-07 NOTE — Patient Instructions (Signed)
Call MD for problems 

## 2013-06-07 NOTE — Progress Notes (Signed)
INR = 2.6 on 3 mg/4 mg alt "Normal" bruises.  No other complaints. No missed doses of Coumadin. INR at goal.  No dose change necessary. Repeat protime 07/13/13 (same day as flush). Ebony Hail, Pharm.D., CPP 06/07/2013@10 :32 AM

## 2013-06-14 ENCOUNTER — Ambulatory Visit (HOSPITAL_COMMUNITY)
Admission: RE | Admit: 2013-06-14 | Discharge: 2013-06-14 | Disposition: A | Discharge status: Home / Self Care | Payer: BC Managed Care – PPO | Source: Ambulatory Visit | Attending: Oncology | Admitting: Oncology

## 2013-06-14 ENCOUNTER — Ambulatory Visit (HOSPITAL_BASED_OUTPATIENT_CLINIC_OR_DEPARTMENT_OTHER): Payer: BC Managed Care – PPO | Admitting: Physician Assistant

## 2013-06-14 ENCOUNTER — Encounter: Payer: Self-pay | Admitting: Physician Assistant

## 2013-06-14 ENCOUNTER — Telehealth: Payer: Self-pay | Admitting: *Deleted

## 2013-06-14 VITALS — BP 164/92 | HR 96 | Temp 98.7°F | Resp 20 | Ht 64.0 in | Wt 145.9 lb

## 2013-06-14 DIAGNOSIS — I82409 Acute embolism and thrombosis of unspecified deep veins of unspecified lower extremity: Secondary | ICD-10-CM

## 2013-06-14 DIAGNOSIS — I82402 Acute embolism and thrombosis of unspecified deep veins of left lower extremity: Secondary | ICD-10-CM

## 2013-06-14 DIAGNOSIS — C50911 Malignant neoplasm of unspecified site of right female breast: Secondary | ICD-10-CM

## 2013-06-14 DIAGNOSIS — Z780 Asymptomatic menopausal state: Secondary | ICD-10-CM

## 2013-06-14 DIAGNOSIS — M810 Age-related osteoporosis without current pathological fracture: Secondary | ICD-10-CM

## 2013-06-14 DIAGNOSIS — Z5189 Encounter for other specified aftercare: Secondary | ICD-10-CM

## 2013-06-14 DIAGNOSIS — C50919 Malignant neoplasm of unspecified site of unspecified female breast: Secondary | ICD-10-CM

## 2013-06-14 DIAGNOSIS — Z1231 Encounter for screening mammogram for malignant neoplasm of breast: Secondary | ICD-10-CM

## 2013-06-14 DIAGNOSIS — I890 Lymphedema, not elsewhere classified: Secondary | ICD-10-CM

## 2013-06-14 MED ORDER — VENLAFAXINE HCL ER 75 MG PO CP24: 75.0000 mg | ORAL_CAPSULE | Freq: Every day | ORAL | Status: DC

## 2013-06-14 MED ORDER — LETROZOLE 2.5 MG PO TABS: 2.5000 mg | ORAL_TABLET | Freq: Every day | ORAL | Status: DC

## 2013-06-14 NOTE — Progress Notes (Signed)
ID: [name]   DOB: January 15, 1956  MR#: [id number]  WUJ#:[id number]  PCP:  GYN:  SU: [name] OTHER MD: Antony Blackbird   HISTORY OF PRESENT ILLNESS: From Dr. Doreatha Massed 07/25/2010 note:  "This woman has not had any significant medical intervention for some time.  Her last mammogram was about seven years ago.  She noted a right breast mass about a year ago and did not seek immediate medical attention for this.  She ultimately, after some time, admitted this to her husband and self-referred herself for intervention.  She had a mammogram on 07/17/2010 with an ultrasound of the right breast.  This showed a lobulated mass upper right outer quadrant measuring at least 6.3 x 7.3 cm and large right axillary lymph nodes were also noted.  There was skin thickening overlying the mass.  On physical exam, this mass was about an 8 cm with skin dimpling noted. Discolored area in the skin over the mass, fullness in the axilla.  Biopsy was recommended, which took place on 07/17/2010.  Pathology showed invasive ductal cancer involving both lymph node and breast.  The HER-2 was not amplified.  ER and PR both positive at 100% and 6% respectively.  Proliferative index was 12% involving both the lymph node and breast mass.  An MRI scan of both breasts was performed on 07/22/2010, essentially which showed a large heterogeneously enhancing mass of the right breast with washout kinetics measuring 6.4 x 4.4 x 5.0 cm.  Numerous satellite nodules were seen throughout the dominant mass.  There were several suspicious nodules in the upper central and upper inner quadrant of the right breast.  There were two enhancing subcentimeter nodules seen in the right pectoralis muscle.  No obvious chest wall nodules were seen; however, there was seen some metastatic adenopathy in the mediastinum as well as hila with a 2.4 x 2.0 cm mediastinal lymph node as well as 1.2 x 3.0 cm subcarinal lymph node.  There was a right hilar and  probable left hilar adenopathy.  Bilateral pulmonary nodules were seen and a right T2 lesion was seen anterior right liver as well."  Her subsequent history is as detailed below.  INTERVAL HISTORY: Nicole [name] returns accompanied by her husband Nicole [name] for followup of her metastatic breast carcinoma. Interval history is generally unremarkable. She continues to work full-time for [employer], and continues to work from home.   She continues on letrozole which she is tolerating well. She has no problematic hot flashes. There've been no significant problems with vaginal dryness, and she denies any increased joint pain. She does have some arthritis in her hands, but this was present prior to initiating the aromatase inhibitor. She has noticed a sensation of swelling or "fullness" in her right upper arm and right axilla, and has never been evaluated by lymphedema clinic.  REVIEW OF SYSTEMS: Nicole [name] denies any recent illnesses and has had no fevers, chills, or increased fatigue. She admits to some mild depression and anxiety. No suicidal ideation. She denies any skin changes and has had no abnormal bruising or bleeding. She continues on Coumadin, followed by our Coumadin clinic. She's eating and drinking well with no nausea or change in bowel or bladder habits. She has no cough, increased shortness of breath, chest pain, or palpitations. She has a history of migraine headaches, but believes these have actually decreased in frequency, perhaps less than once per month. She continues on Fosamax for a history of osteoporosis, with good tolerance.  A detailed review  of systems today was otherwise noncontributory.   PAST MEDICAL HISTORY: Past Medical History  Diagnosis Date  . IBD (inflammatory bowel disease)     resolved  . Heart murmur     mild, no cardiologist  . Blood transfusion 2012  . Breast CA     breast ca dx 06/2010, stage 4, right   . Radiation 07/21/12-09/07/12    5940 cGy    PAST SURGICAL  HISTORY: Past Surgical History  Procedure Laterality Date  . Portacath placement  2011    left side  . Dilation and curettage of uterus  2004?  [name] Mastectomy  06/02/12    right breast    FAMILY HISTORY Family History  Problem Relation Age of Onset  . Cancer Neg Hx    patient's father died at the age of 82 from heart disease. Patient's mother died at the age of 10 status post CABG. The patient had no brothers, 2 sisters. There is no history of breast or ovarian cancer in the family.  GYNECOLOGIC HISTORY: Menarche age 4, she is GX P0. She does not recall when she went through menopause. She never took hormone replacement.  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 Husband Nicole [name] is retired from [employer]. They have no children and no pets. They attend a local [address].   ADVANCED DIRECTIVES: Not in place  HEALTH MAINTENANCE: History  Substance Use Topics  . Smoking status: Never Smoker   . Smokeless tobacco: Never Used  . Alcohol Use: No     Colonoscopy:  PAP:  Bone density: 01/28/2011, osteoporosis  Lipid panel:  No Known Allergies  Current Outpatient Prescriptions  Medication Sig Dispense Refill  . alendronate (FOSAMAX) 70 MG tablet TAKE 1 TABLET ONCE A WEEK  12 tablet  0  . Calcium Carbonate (CALTRATE 600 PO) Take by mouth 2 (two) times daily.       . Cholecalciferol (VITAMIN D PO) Take 2,000 Units by mouth once.       [name] letrozole (FEMARA) 2.5 MG tablet Take 1 tablet (2.5 mg total) by mouth daily.  90 tablet  3  . venlafaxine XR (EFFEXOR-XR) 75 MG 24 hr capsule Take 1 capsule (75 mg total) by mouth daily.  90 capsule  1  . warfarin (COUMADIN) 2 MG tablet TAKE TWO (2) TABLETS BY MOUTH EVERY OTHER DAY ALTERNATING WITH 5 MG TABLETS.  60 tablet  2   No current facility-administered medications for this visit.    OBJECTIVE: Middle-aged woman in no acute distress Filed Vitals:   06/14/13 0927  BP: 164/92  Pulse: 96  Temp:  98.7 F (37.1 C)  Resp: 20     Body mass index is 25.03 kg/(m^2).    ECOG FS: 0 Filed Weights   06/14/13 0927  Weight: 145 lb 14.4 oz (66.18 kg)   Sclerae unicteric Oropharynx clear No cervical or supraclavicular adenopathy Lungs clear to auscultation bilaterally, no wheezes, no rales or rhonchi Heart regular rate and rhythm, no murmur appreciated Abdomen soft, nontender to palpation, positive bowel sounds  MSK no focal spinal tenderness to palpation  Mild, nonpitting edema noted in the right upper extremity and right axilla. No additional peripheral edema is noted.  Neuro: nonfocal, well oriented,  friendly  affect Breasts: The right breast is status post mastectomy. There is no evidence of local recurrence.  The left breast is unremarkable.  No palpable adenopathy on either the right or the left.  LAB RESULTS: Lab  Results  Component Value Date   WBC 3.2* 06/07/2013   NEUTROABS 2.2 06/07/2013   HGB 13.5 06/07/2013   HCT 38.6 06/07/2013   MCV 84.8 06/07/2013   PLT 82* 06/07/2013      Chemistry      Component Value Date/Time   NA 140 04/20/2013 0859   NA 140 08/21/2012 1307   K 4.0 04/20/2013 0859   K 3.9 08/21/2012 1307   CL 109* 04/20/2013 0859   CL 105 08/21/2012 1307   CO2 21* 04/20/2013 0859   CO2 24 08/21/2012 1307   BUN 12.4 04/20/2013 0859   BUN 15 08/21/2012 1307   CREATININE 1.0 04/20/2013 0859   CREATININE 0.93 08/21/2012 1307      Component Value Date/Time   CALCIUM 9.2 04/20/2013 0859   CALCIUM 9.2 08/21/2012 1307   ALKPHOS 70 04/20/2013 0859   ALKPHOS 59 08/21/2012 1307   AST 26 04/20/2013 0859   AST 24 08/21/2012 1307   ALT 36 04/20/2013 0859   ALT 30 08/21/2012 1307   BILITOT 0.51 04/20/2013 0859   BILITOT 0.6 08/21/2012 1307        STUDIES:  Dg Chest 2 View  06/14/2013   *RADIOLOGY REPORT*  Clinical Data: Breast cancer, evaluate for metastasis  CHEST - 2 VIEW  Comparison: 03/26/2013  Findings: Left Port-A-Cath in unchanged position.  The heart size and vascular pattern  are normal.  Surgical clips again identified in the right axilla.  No evidence of abnormal pulmonary parenchymal opacity except for mild left upper lobe scarring.  IMPRESSION: No acute abnormality.  No evidence of pulmonary metastasis.   Original Report Authenticated By: Esperanza Heir, M.D.     Dg Chest 2 View  03/26/2013  *RADIOLOGY REPORT*  Clinical Data: Right breast carcinoma  CHEST - 2 VIEW  Comparison: 06/16/2012  Findings: The heart and pulmonary vascularity are stable.  A left chest wall port is again seen and stable.  Postsurgical changes are noted on the right.  The lungs are clear bilaterally.  IMPRESSION: Postsurgical changes.  No acute abnormality is noted.   Original Report Authenticated By: Alcide Clever, M.D.      Note that PET scan 02/12/2012 showed no "hot" areas (original PET scan 07/26/2010 had showed strong uptake in all the areas of clinical involvement described).     Chest CT 12/02/2012 showed resolution of the lung lesions and adenopathy, with some groundglass consolidation in the right upper and middle lobes of unclear significance.    ASSESSMENT: 57 y.o. [place] woman presenting June 2011 with stage IV breast cancer involving the right breast, right axilla, mediastinal lymph nodes, and both lungs, but not the brain, liver, or bones  (1) positive right breast and right axillary lymph node biopsies 07/17/2010 of a clinical T4 N2 M1 invasive ductal carcinoma, grade 1, estrogen receptor 100% positive, progesterone receptor 6% positive, with an MIB-1 of 12%, and no HER-2 amplification.  (2) participated in Phase II tessetaxel study, receiving 2 cycles complicated by thrombocytopenia, anemia requiring transfusion, transaminase elevation, and afebrile neutropenia. Off-study as of September 2011. Chest CT scan September 2011 did show evidence of response.  (3) on letrozole as of October 2011, with continuing response  (4) 06/02/2012 underwent right mastectomy and  axillary lymph node sampling (3 lymph nodes removed, all with viable cancer as well as evidence of treatment effect) for a ypT2 ypN1-2 invasive ductal carcinoma, grade 1, 98% estrogen receptor positive, progesterone receptor negative, with no HER-2 amplification  (5) left jugular vein DVT  documented 06/16/2012; left sided Port-A-Cath in place,  On Coumadin.   PLAN:  Nicole [name] continues to do very well with regards to her stage IV breast carcinoma. She's tolerating the letrozole very well, and I have refilled this for her at 2.5 mg daily. She'll also continue on Effexor which I have refilled today as well.  Per Dr. Darrall Dears last note, as far as her left upper extremity DVT is concerned, she will continue on Coumadin, but could stop 2 months after having the port removed. Since she would like to keep the port, she'll continue on Coumadin and will be followed by our Coumadin clinic on a regular basis, with her next appointment on July 15. We'll also continue to flush her port every 4-6 weeks.   I am placing a referral to the lymphedema clinic for further evaluation of some mild edema in the right upper extremity, primarily affecting the right axilla.  In the absence of any increased symptoms, our plan is to see Yurika every 3 months with a repeat chest x-ray prior to each visit. We will consider a chest CT annually, the next of which would be due in December of 2014, and she'll discuss this with Dr. Darnelle Catalan when she sees him for follow up in late September/early October.  She'll also have her bone density repeated in the next couple of months to follow the osteoporosis, and will also have her left mammogram in late September prior to seeing Dr. Darnelle Catalan.   All of this was reviewed with Nicole [name] and Nicole [name] today, both of whom voice understanding and agreement with this plan. They will call any changes or problems.   Nicole [name]    06/14/2013

## 2013-06-14 NOTE — Telephone Encounter (Signed)
appts made and printed. Pt is going over to have cxr today. Pt is aware that Nicole [name] from the Lymphedema clinic will call her with an appt d/t. Pt is aware that on 9/22 she is to go have another cxr...td

## 2013-06-15 ENCOUNTER — Telehealth: Payer: Self-pay | Admitting: *Deleted

## 2013-06-15 ENCOUNTER — Other Ambulatory Visit: Payer: Self-pay | Admitting: Oncology

## 2013-06-15 DIAGNOSIS — C50911 Malignant neoplasm of unspecified site of right female breast: Secondary | ICD-10-CM

## 2013-06-15 NOTE — Telephone Encounter (Signed)
sw pt gv appt for a bone density and mammogram for 09/24/13@ 9:30am. Pt is aware...td

## 2013-06-22 ENCOUNTER — Other Ambulatory Visit (HOSPITAL_COMMUNITY): Payer: BC Managed Care – PPO

## 2013-07-01 ENCOUNTER — Ambulatory Visit: Payer: BC Managed Care – PPO | Admitting: Physical Therapy

## 2013-07-05 ENCOUNTER — Ambulatory Visit: Payer: BC Managed Care – PPO | Attending: Physician Assistant | Admitting: Physical Therapy

## 2013-07-05 DIAGNOSIS — Z9010 Acquired absence of unspecified breast and nipple: Secondary | ICD-10-CM | POA: Insufficient documentation

## 2013-07-05 DIAGNOSIS — IMO0001 Reserved for inherently not codable concepts without codable children: Secondary | ICD-10-CM | POA: Insufficient documentation

## 2013-07-05 DIAGNOSIS — I890 Lymphedema, not elsewhere classified: Secondary | ICD-10-CM | POA: Insufficient documentation

## 2013-07-05 DIAGNOSIS — C50919 Malignant neoplasm of unspecified site of unspecified female breast: Secondary | ICD-10-CM | POA: Insufficient documentation

## 2013-07-06 ENCOUNTER — Other Ambulatory Visit: Payer: Self-pay | Admitting: *Deleted

## 2013-07-13 ENCOUNTER — Ambulatory Visit: Payer: BC Managed Care – PPO | Admitting: Pharmacist

## 2013-07-13 ENCOUNTER — Ambulatory Visit (HOSPITAL_BASED_OUTPATIENT_CLINIC_OR_DEPARTMENT_OTHER): Payer: BC Managed Care – PPO

## 2013-07-13 ENCOUNTER — Other Ambulatory Visit (HOSPITAL_BASED_OUTPATIENT_CLINIC_OR_DEPARTMENT_OTHER): Payer: BC Managed Care – PPO | Admitting: Lab

## 2013-07-13 VITALS — BP 157/98 | HR 82 | Temp 99.0°F | Resp 18

## 2013-07-13 DIAGNOSIS — I82409 Acute embolism and thrombosis of unspecified deep veins of unspecified lower extremity: Secondary | ICD-10-CM

## 2013-07-13 DIAGNOSIS — C50911 Malignant neoplasm of unspecified site of right female breast: Secondary | ICD-10-CM

## 2013-07-13 DIAGNOSIS — Z452 Encounter for adjustment and management of vascular access device: Secondary | ICD-10-CM

## 2013-07-13 DIAGNOSIS — Z7901 Long term (current) use of anticoagulants: Secondary | ICD-10-CM

## 2013-07-13 DIAGNOSIS — I82402 Acute embolism and thrombosis of unspecified deep veins of left lower extremity: Secondary | ICD-10-CM

## 2013-07-13 DIAGNOSIS — C50419 Malignant neoplasm of upper-outer quadrant of unspecified female breast: Secondary | ICD-10-CM

## 2013-07-13 DIAGNOSIS — Z5181 Encounter for therapeutic drug level monitoring: Secondary | ICD-10-CM

## 2013-07-13 MED ORDER — SODIUM CHLORIDE 0.9 % IJ SOLN
10.0000 mL | INTRAMUSCULAR | Status: DC | PRN
  Administered 2013-07-13: 10 mL via INTRAVENOUS
  Filled 2013-07-13: qty 10

## 2013-07-13 MED ORDER — HEPARIN SOD (PORK) LOCK FLUSH 100 UNIT/ML IV SOLN
500.0000 [IU] | Freq: Once | INTRAVENOUS | Status: AC
  Administered 2013-07-13: 500 [IU] via INTRAVENOUS
  Filled 2013-07-13: qty 5

## 2013-07-13 NOTE — Patient Instructions (Addendum)
Implanted Port Instructions  An implanted port is a central line that has a round shape and is placed under the skin. It is used for long-term IV (intravenous) access for:  · Medicine.  · Fluids.  · Liquid nutrition, such as TPN (total parenteral nutrition).  · Blood samples.  Ports can be placed:  · In the chest area just below the collarbone (this is the most common place.)  · In the arms.  · In the belly (abdomen) area.  · In the legs.  PARTS OF THE PORT  A port has 2 main parts:  · The reservoir. The reservoir is round, disc-shaped, and will be a small, raised area under your skin.  · The reservoir is the part where a needle is inserted (accessed) to either give medicines or to draw blood.  · The catheter. The catheter is a long, slender tube that extends from the reservoir. The catheter is placed into a large vein.  · Medicine that is inserted into the reservoir goes into the catheter and then into the vein.  INSERTION OF THE PORT  · The port is surgically placed in either an operating room or in a procedural area (interventional radiology).  · Medicine may be given to help you relax during the procedure.  · The skin where the port will be inserted is numbed (local anesthetic).  · 1 or 2 small cuts (incisions) will be made in the skin to insert the port.  · The port can be used after it has been inserted.  INCISION SITE CARE  · The incision site may have small adhesive strips on it. This helps keep the incision site closed. Sometimes, no adhesive strips are placed. Instead of adhesive strips, a special kind of surgical glue is used to keep the incision closed.  · If adhesive strips were placed on the incision sites, do not take them off. They will fall off on their own.  · The incision site may be sore for 1 to 2 days. Pain medicine can help.  · Do not get the incision site wet. Bathe or shower as directed by your caregiver.  · The incision site should heal in 5 to 7 days. A small scar may form after the  incision has healed.  ACCESSING THE PORT  Special steps must be taken to access the port:  · Before the port is accessed, a numbing cream can be placed on the skin. This helps numb the skin over the port site.  · A sterile technique is used to access the port.  · The port is accessed with a needle. Only "non-coring" port needles should be used to access the port. Once the port is accessed, a blood return should be checked. This helps ensure the port is in the vein and is not clogged (clotted).  · If your caregiver believes your port should remain accessed, a clear (transparent) bandage will be placed over the needle site. The bandage and needle will need to be changed every week or as directed by your caregiver.  · Keep the bandage covering the needle clean and dry. Do not get it wet. Follow your caregiver's instructions on how to take a shower or bath when the port is accessed.  · If your port does not need to stay accessed, no bandage is needed over the port.  FLUSHING THE PORT  Flushing the port keeps it from getting clogged. How often the port is flushed depends on:  · If a   constant infusion is running. If a constant infusion is running, the port may not need to be flushed.  · If intermittent medicines are given.  · If the port is not being used.  For intermittent medicines:  · The port will need to be flushed:  · After medicines have been given.  · After blood has been drawn.  · As part of routine maintenance.  · A port is normally flushed with:  · Normal saline.  · Heparin.  · Follow your caregiver's advice on how often, how much, and the type of flush to use on your port.  IMPORTANT PORT INFORMATION  · Tell your caregiver if you are allergic to heparin.  · After your port is placed, you will get a manufacturer's information card. The card has information about your port. Keep this card with you at all times.  · There are many types of ports available. Know what kind of port you have.  · In case of an  emergency, it may be helpful to wear a medical alert bracelet. This can help alert health care workers that you have a port.  · The port can stay in for as long as your caregiver believes it is necessary.  · When it is time for the port to come out, surgery will be done to remove it. The surgery will be similar to how the port was put in.  · If you are in the hospital or clinic:  · Your port will be taken care of and flushed by a nurse.  · If you are at home:  · A home health care nurse may give medicines and take care of the port.  · You or a family member can get special training and directions for giving medicine and taking care of the port at home.  SEEK IMMEDIATE MEDICAL CARE IF:   · Your port does not flush or you are unable to get a blood return.  · New drainage or pus is coming from the incision.  · A bad smell is coming from the incision site.  · You develop swelling or increased redness at the incision site.  · You develop increased swelling or pain at the port site.  · You develop swelling or pain in the surrounding skin near the port.  · You have an oral temperature above 102° F (38.9° C), not controlled by medicine.  MAKE SURE YOU:   · Understand these instructions.  · Will watch your condition.  · Will get help right away if you are not doing well or get worse.  Document Released: 12/16/2005 Document Revised: 03/09/2012 Document Reviewed: 03/09/2009  [employer]® Patient Information ©2014 [employer].

## 2013-07-13 NOTE — Patient Instructions (Addendum)
Continue current dose; 3mg  alternating with 4mg . Repeat INR on 08/23/13 at 8:45 am for lab, 9 am for Coumadin clinic and 9:30 am for flush. Continue to eat your usual amounts of spinach salads.

## 2013-07-13 NOTE — Progress Notes (Signed)
INR slightly above goal today. No problems to report regarding anticoagulation. Some small bruises. Nothing concerning. No changes in medications. No missed doses of coumadin. Pt has had decreased Vitamin K intake over the last week. She has not had her usual frequency of spinach salads. She usually eats about 3-4 salads per week. Continue current dose; 3mg  alternating with 4mg . Pt has been very stable on this dose without much variation. Repeat INR on 08/23/13 at 8:45 am for lab, 9 am for Coumadin clinic and 9:30 am for flush. Pt plans to return to eating her usual amounts of spinach.

## 2013-07-14 ENCOUNTER — Ambulatory Visit: Payer: BC Managed Care – PPO | Admitting: Physical Therapy

## 2013-08-23 ENCOUNTER — Ambulatory Visit (HOSPITAL_BASED_OUTPATIENT_CLINIC_OR_DEPARTMENT_OTHER): Payer: BC Managed Care – PPO

## 2013-08-23 ENCOUNTER — Ambulatory Visit (HOSPITAL_BASED_OUTPATIENT_CLINIC_OR_DEPARTMENT_OTHER): Payer: BC Managed Care – PPO | Admitting: Pharmacist

## 2013-08-23 ENCOUNTER — Other Ambulatory Visit: Payer: BC Managed Care – PPO | Admitting: Lab

## 2013-08-23 VITALS — BP 162/87 | HR 88 | Temp 98.3°F

## 2013-08-23 DIAGNOSIS — C50919 Malignant neoplasm of unspecified site of unspecified female breast: Secondary | ICD-10-CM

## 2013-08-23 DIAGNOSIS — Z452 Encounter for adjustment and management of vascular access device: Secondary | ICD-10-CM

## 2013-08-23 DIAGNOSIS — I82402 Acute embolism and thrombosis of unspecified deep veins of left lower extremity: Secondary | ICD-10-CM

## 2013-08-23 DIAGNOSIS — I82409 Acute embolism and thrombosis of unspecified deep veins of unspecified lower extremity: Secondary | ICD-10-CM

## 2013-08-23 DIAGNOSIS — C50419 Malignant neoplasm of upper-outer quadrant of unspecified female breast: Secondary | ICD-10-CM

## 2013-08-23 LAB — PROTIME-INR
INR: 2.3 (ref 2.00–3.50)
Protime: 27.6 Seconds — ABNORMAL HIGH (ref 10.6–13.4)

## 2013-08-23 LAB — POCT INR: INR: 2.3

## 2013-08-23 MED ORDER — HEPARIN SOD (PORK) LOCK FLUSH 100 UNIT/ML IV SOLN
500.0000 [IU] | Freq: Once | INTRAVENOUS | Status: AC
  Administered 2013-08-23: 500 [IU] via INTRAVENOUS
  Filled 2013-08-23: qty 5

## 2013-08-23 MED ORDER — SODIUM CHLORIDE 0.9 % IJ SOLN
10.0000 mL | INTRAMUSCULAR | Status: DC | PRN
  Administered 2013-08-23: 10 mL via INTRAVENOUS
  Filled 2013-08-23: qty 10

## 2013-08-23 NOTE — Patient Instructions (Signed)
Continue current dose; 3mg  alternating with 4mg . Repeat INR on 09/20/13 at 9am for lab, 9:15 am for flush and 9:45am for Coumadin clinic.

## 2013-08-23 NOTE — Progress Notes (Signed)
INR within goal today. No problems to report regarding anticoagulation. No changes in diet or medications. No missed coumadin doses. Will continue current dose; 3mg  alternating with 4mg . Repeat INR on 09/20/13 at 9am for lab, 9:15 am for flush and 9:45am for Coumadin clinic.

## 2013-08-23 NOTE — Patient Instructions (Addendum)
Implanted Port Instructions  An implanted port is a central line that has a round shape and is placed under the skin. It is used for long-term IV (intravenous) access for:  · Medicine.  · Fluids.  · Liquid nutrition, such as TPN (total parenteral nutrition).  · Blood samples.  Ports can be placed:  · In the chest area just below the collarbone (this is the most common place.)  · In the arms.  · In the belly (abdomen) area.  · In the legs.  PARTS OF THE PORT  A port has 2 main parts:  · The reservoir. The reservoir is round, disc-shaped, and will be a small, raised area under your skin.  · The reservoir is the part where a needle is inserted (accessed) to either give medicines or to draw blood.  · The catheter. The catheter is a long, slender tube that extends from the reservoir. The catheter is placed into a large vein.  · Medicine that is inserted into the reservoir goes into the catheter and then into the vein.  INSERTION OF THE PORT  · The port is surgically placed in either an operating room or in a procedural area (interventional radiology).  · Medicine may be given to help you relax during the procedure.  · The skin where the port will be inserted is numbed (local anesthetic).  · 1 or 2 small cuts (incisions) will be made in the skin to insert the port.  · The port can be used after it has been inserted.  INCISION SITE CARE  · The incision site may have small adhesive strips on it. This helps keep the incision site closed. Sometimes, no adhesive strips are placed. Instead of adhesive strips, a special kind of surgical glue is used to keep the incision closed.  · If adhesive strips were placed on the incision sites, do not take them off. They will fall off on their own.  · The incision site may be sore for 1 to 2 days. Pain medicine can help.  · Do not get the incision site wet. Bathe or shower as directed by your caregiver.  · The incision site should heal in 5 to 7 days. A small scar may form after the  incision has healed.  ACCESSING THE PORT  Special steps must be taken to access the port:  · Before the port is accessed, a numbing cream can be placed on the skin. This helps numb the skin over the port site.  · A sterile technique is used to access the port.  · The port is accessed with a needle. Only "non-coring" port needles should be used to access the port. Once the port is accessed, a blood return should be checked. This helps ensure the port is in the vein and is not clogged (clotted).  · If your caregiver believes your port should remain accessed, a clear (transparent) bandage will be placed over the needle site. The bandage and needle will need to be changed every week or as directed by your caregiver.  · Keep the bandage covering the needle clean and dry. Do not get it wet. Follow your caregiver's instructions on how to take a shower or bath when the port is accessed.  · If your port does not need to stay accessed, no bandage is needed over the port.  FLUSHING THE PORT  Flushing the port keeps it from getting clogged. How often the port is flushed depends on:  · If a   constant infusion is running. If a constant infusion is running, the port may not need to be flushed.  · If intermittent medicines are given.  · If the port is not being used.  For intermittent medicines:  · The port will need to be flushed:  · After medicines have been given.  · After blood has been drawn.  · As part of routine maintenance.  · A port is normally flushed with:  · Normal saline.  · Heparin.  · Follow your caregiver's advice on how often, how much, and the type of flush to use on your port.  IMPORTANT PORT INFORMATION  · Tell your caregiver if you are allergic to heparin.  · After your port is placed, you will get a manufacturer's information card. The card has information about your port. Keep this card with you at all times.  · There are many types of ports available. Know what kind of port you have.  · In case of an  emergency, it may be helpful to wear a medical alert bracelet. This can help alert health care workers that you have a port.  · The port can stay in for as long as your caregiver believes it is necessary.  · When it is time for the port to come out, surgery will be done to remove it. The surgery will be similar to how the port was put in.  · If you are in the hospital or clinic:  · Your port will be taken care of and flushed by a nurse.  · If you are at home:  · A home health care nurse may give medicines and take care of the port.  · You or a family member can get special training and directions for giving medicine and taking care of the port at home.  SEEK IMMEDIATE MEDICAL CARE IF:   · Your port does not flush or you are unable to get a blood return.  · New drainage or pus is coming from the incision.  · A bad smell is coming from the incision site.  · You develop swelling or increased redness at the incision site.  · You develop increased swelling or pain at the port site.  · You develop swelling or pain in the surrounding skin near the port.  · You have an oral temperature above 102° F (38.9° C), not controlled by medicine.  MAKE SURE YOU:   · Understand these instructions.  · Will watch your condition.  · Will get help right away if you are not doing well or get worse.  Document Released: 12/16/2005 Document Revised: 03/09/2012 Document Reviewed: 03/09/2009  [employer]® Patient Information ©2014 [employer].

## 2013-09-06 ENCOUNTER — Other Ambulatory Visit: Payer: Self-pay | Admitting: Oncology

## 2013-09-20 ENCOUNTER — Ambulatory Visit: Payer: BC Managed Care – PPO | Admitting: Pharmacist

## 2013-09-20 ENCOUNTER — Ambulatory Visit: Payer: BC Managed Care – PPO

## 2013-09-20 ENCOUNTER — Other Ambulatory Visit (HOSPITAL_BASED_OUTPATIENT_CLINIC_OR_DEPARTMENT_OTHER): Payer: BC Managed Care – PPO | Admitting: Lab

## 2013-09-20 VITALS — BP 149/86 | HR 86 | Temp 97.0°F

## 2013-09-20 DIAGNOSIS — I82409 Acute embolism and thrombosis of unspecified deep veins of unspecified lower extremity: Secondary | ICD-10-CM

## 2013-09-20 DIAGNOSIS — C50911 Malignant neoplasm of unspecified site of right female breast: Secondary | ICD-10-CM

## 2013-09-20 DIAGNOSIS — I82402 Acute embolism and thrombosis of unspecified deep veins of left lower extremity: Secondary | ICD-10-CM

## 2013-09-20 DIAGNOSIS — C50419 Malignant neoplasm of upper-outer quadrant of unspecified female breast: Secondary | ICD-10-CM

## 2013-09-20 LAB — CBC WITH DIFFERENTIAL/PLATELET
BASO%: 0.4 % (ref 0.0–2.0)
LYMPH%: 22.8 % (ref 14.0–49.7)
MCHC: 34.5 g/dL (ref 31.5–36.0)
MONO#: 0.3 10*3/uL (ref 0.1–0.9)
NEUT#: 2.4 10*3/uL (ref 1.5–6.5)
RBC: 4.58 10*6/uL (ref 3.70–5.45)
RDW: 14.5 % (ref 11.2–14.5)
WBC: 3.5 10*3/uL — ABNORMAL LOW (ref 3.9–10.3)
lymph#: 0.8 10*3/uL — ABNORMAL LOW (ref 0.9–3.3)

## 2013-09-20 LAB — COMPREHENSIVE METABOLIC PANEL (CC13)
AST: 21 U/L (ref 5–34)
Alkaline Phosphatase: 64 U/L (ref 40–150)
Glucose: 94 mg/dl (ref 70–140)
Sodium: 141 mEq/L (ref 136–145)
Total Bilirubin: 0.61 mg/dL (ref 0.20–1.20)
Total Protein: 7 g/dL (ref 6.4–8.3)

## 2013-09-20 LAB — PROTIME-INR
INR: 3.1 (ref 2.00–3.50)
Protime: 37.2 Seconds — ABNORMAL HIGH (ref 10.6–13.4)

## 2013-09-20 MED ORDER — SODIUM CHLORIDE 0.9 % IJ SOLN
10.0000 mL | INTRAMUSCULAR | Status: DC | PRN
  Administered 2013-09-20: 10 mL via INTRAVENOUS
  Filled 2013-09-20: qty 10

## 2013-09-20 MED ORDER — HEPARIN SOD (PORK) LOCK FLUSH 100 UNIT/ML IV SOLN
500.0000 [IU] | Freq: Once | INTRAVENOUS | Status: AC
  Administered 2013-09-20: 500 [IU] via INTRAVENOUS
  Filled 2013-09-20: qty 5

## 2013-09-20 NOTE — Progress Notes (Signed)
INR right at goal today at 3.1. Pt reports no missed doses in the past month. No bleeding but pt does have some bruising on her legs that she reports as normal from her daily activities. These are healing nicely on their own. Nicole [name] reports no medication changes but does state that her diet has consisted of less spinach and salads in the past few weeks likely causing her INR to rise slightly. Other than this Nicole [name] and her husband are doing great. We will make no changes to her coumadin regimen. Plan to continue current dose; 3mg  alternating with 4mg  on Monday, Wednesday and Friday. Repeat INR on 11/01/13 at 9am for lab, 9:15 am for flush and 9:45am for Coumadin clinic

## 2013-09-20 NOTE — Patient Instructions (Addendum)
INR at goal No changes.  Continue current dose; 3mg  alternating with 4mg . Repeat INR on 11/01/13 at 9am for lab, 9:15 am for flush and 9:45am for Coumadin clinic

## 2013-09-20 NOTE — Patient Instructions (Signed)
Implanted Port Instructions  An implanted port is a central line that has a round shape and is placed under the skin. It is used for long-term IV (intravenous) access for:  · Medicine.  · Fluids.  · Liquid nutrition, such as TPN (total parenteral nutrition).  · Blood samples.  Ports can be placed:  · In the chest area just below the collarbone (this is the most common place.)  · In the arms.  · In the belly (abdomen) area.  · In the legs.  PARTS OF THE PORT  A port has 2 main parts:  · The reservoir. The reservoir is round, disc-shaped, and will be a small, raised area under your skin.  · The reservoir is the part where a needle is inserted (accessed) to either give medicines or to draw blood.  · The catheter. The catheter is a long, slender tube that extends from the reservoir. The catheter is placed into a large vein.  · Medicine that is inserted into the reservoir goes into the catheter and then into the vein.  INSERTION OF THE PORT  · The port is surgically placed in either an operating room or in a procedural area (interventional radiology).  · Medicine may be given to help you relax during the procedure.  · The skin where the port will be inserted is numbed (local anesthetic).  · 1 or 2 small cuts (incisions) will be made in the skin to insert the port.  · The port can be used after it has been inserted.  INCISION SITE CARE  · The incision site may have small adhesive strips on it. This helps keep the incision site closed. Sometimes, no adhesive strips are placed. Instead of adhesive strips, a special kind of surgical glue is used to keep the incision closed.  · If adhesive strips were placed on the incision sites, do not take them off. They will fall off on their own.  · The incision site may be sore for 1 to 2 days. Pain medicine can help.  · Do not get the incision site wet. Bathe or shower as directed by your caregiver.  · The incision site should heal in 5 to 7 days. A small scar may form after the  incision has healed.  ACCESSING THE PORT  Special steps must be taken to access the port:  · Before the port is accessed, a numbing cream can be placed on the skin. This helps numb the skin over the port site.  · A sterile technique is used to access the port.  · The port is accessed with a needle. Only "non-coring" port needles should be used to access the port. Once the port is accessed, a blood return should be checked. This helps ensure the port is in the vein and is not clogged (clotted).  · If your caregiver believes your port should remain accessed, a clear (transparent) bandage will be placed over the needle site. The bandage and needle will need to be changed every week or as directed by your caregiver.  · Keep the bandage covering the needle clean and dry. Do not get it wet. Follow your caregiver's instructions on how to take a shower or bath when the port is accessed.  · If your port does not need to stay accessed, no bandage is needed over the port.  FLUSHING THE PORT  Flushing the port keeps it from getting clogged. How often the port is flushed depends on:  · If a   constant infusion is running. If a constant infusion is running, the port may not need to be flushed.  · If intermittent medicines are given.  · If the port is not being used.  For intermittent medicines:  · The port will need to be flushed:  · After medicines have been given.  · After blood has been drawn.  · As part of routine maintenance.  · A port is normally flushed with:  · Normal saline.  · Heparin.  · Follow your caregiver's advice on how often, how much, and the type of flush to use on your port.  IMPORTANT PORT INFORMATION  · Tell your caregiver if you are allergic to heparin.  · After your port is placed, you will get a manufacturer's information card. The card has information about your port. Keep this card with you at all times.  · There are many types of ports available. Know what kind of port you have.  · In case of an  emergency, it may be helpful to wear a medical alert bracelet. This can help alert health care workers that you have a port.  · The port can stay in for as long as your caregiver believes it is necessary.  · When it is time for the port to come out, surgery will be done to remove it. The surgery will be similar to how the port was put in.  · If you are in the hospital or clinic:  · Your port will be taken care of and flushed by a nurse.  · If you are at home:  · A home health care nurse may give medicines and take care of the port.  · You or a family member can get special training and directions for giving medicine and taking care of the port at home.  SEEK IMMEDIATE MEDICAL CARE IF:   · Your port does not flush or you are unable to get a blood return.  · New drainage or pus is coming from the incision.  · A bad smell is coming from the incision site.  · You develop swelling or increased redness at the incision site.  · You develop increased swelling or pain at the port site.  · You develop swelling or pain in the surrounding skin near the port.  · You have an oral temperature above 102° F (38.9° C), not controlled by medicine.  MAKE SURE YOU:   · Understand these instructions.  · Will watch your condition.  · Will get help right away if you are not doing well or get worse.  Document Released: 12/16/2005 Document Revised: 03/09/2012 Document Reviewed: 03/09/2009  [employer]® Patient Information ©2014 [employer].

## 2013-09-24 ENCOUNTER — Other Ambulatory Visit: Payer: BC Managed Care – PPO

## 2013-09-24 ENCOUNTER — Ambulatory Visit: Payer: BC Managed Care – PPO

## 2013-09-25 ENCOUNTER — Other Ambulatory Visit: Payer: Self-pay | Admitting: Oncology

## 2013-09-27 ENCOUNTER — Ambulatory Visit
Admission: RE | Admit: 2013-09-27 | Discharge: 2013-09-27 | Disposition: A | Discharge status: Home / Self Care | Payer: BC Managed Care – PPO | Source: Ambulatory Visit | Attending: Physician Assistant | Admitting: Physician Assistant

## 2013-09-27 DIAGNOSIS — M810 Age-related osteoporosis without current pathological fracture: Secondary | ICD-10-CM

## 2013-09-27 DIAGNOSIS — C50911 Malignant neoplasm of unspecified site of right female breast: Secondary | ICD-10-CM

## 2013-09-27 DIAGNOSIS — Z780 Asymptomatic menopausal state: Secondary | ICD-10-CM

## 2013-09-27 DIAGNOSIS — Z1231 Encounter for screening mammogram for malignant neoplasm of breast: Secondary | ICD-10-CM

## 2013-09-30 ENCOUNTER — Other Ambulatory Visit: Payer: Self-pay | Admitting: *Deleted

## 2013-09-30 ENCOUNTER — Ambulatory Visit (HOSPITAL_BASED_OUTPATIENT_CLINIC_OR_DEPARTMENT_OTHER): Payer: BC Managed Care – PPO | Admitting: Oncology

## 2013-09-30 ENCOUNTER — Telehealth: Payer: Self-pay | Admitting: Oncology

## 2013-09-30 ENCOUNTER — Other Ambulatory Visit: Payer: BC Managed Care – PPO | Admitting: Lab

## 2013-09-30 VITALS — BP 146/93 | HR 96 | Temp 98.2°F | Resp 18 | Ht 64.0 in | Wt 140.9 lb

## 2013-09-30 DIAGNOSIS — C7800 Secondary malignant neoplasm of unspecified lung: Secondary | ICD-10-CM

## 2013-09-30 DIAGNOSIS — I82409 Acute embolism and thrombosis of unspecified deep veins of unspecified lower extremity: Secondary | ICD-10-CM

## 2013-09-30 DIAGNOSIS — Z170 Estrogen receptor positive status [ER+]: Secondary | ICD-10-CM

## 2013-09-30 DIAGNOSIS — F411 Generalized anxiety disorder: Secondary | ICD-10-CM

## 2013-09-30 DIAGNOSIS — C773 Secondary and unspecified malignant neoplasm of axilla and upper limb lymph nodes: Secondary | ICD-10-CM

## 2013-09-30 DIAGNOSIS — C50411 Malignant neoplasm of upper-outer quadrant of right female breast: Secondary | ICD-10-CM | POA: Insufficient documentation

## 2013-09-30 DIAGNOSIS — C50419 Malignant neoplasm of upper-outer quadrant of unspecified female breast: Secondary | ICD-10-CM

## 2013-09-30 NOTE — Addendum Note (Signed)
Addended by: Billey Co on: 09/30/2013 12:42 PM   Modules accepted: Orders

## 2013-09-30 NOTE — Progress Notes (Signed)
ID: Nicole [name]   DOB: 1956-03-23  MR#: [id number]  WUJ#:[id number]  PCP:  GYN:  SU: Nicole [name] OTHER MD: Nicole [name]   HISTORY OF PRESENT ILLNESS: From Dr. Doreatha Massed 07/25/2010 note:  "This woman has not had any significant medical intervention for some time.  Her last mammogram was about seven years ago.  She noted a right breast mass about a year ago and did not seek immediate medical attention for this.  She ultimately, after some time, admitted this to her husband and self-referred herself for intervention.  She had a mammogram on 07/17/2010 with an ultrasound of the right breast.  This showed a lobulated mass upper right outer quadrant measuring at least 6.3 x 7.3 cm and large right axillary lymph nodes were also noted.  There was skin thickening overlying the mass.  On physical exam, this mass was about an 8 cm with skin dimpling noted. Discolored area in the skin over the mass, fullness in the axilla.  Biopsy was recommended, which took place on 07/17/2010.  Pathology showed invasive ductal cancer involving both lymph node and breast.  The HER-2 was not amplified.  ER and PR both positive at 100% and 6% respectively.  Proliferative index was 12% involving both the lymph node and breast mass.  An MRI scan of both breasts was performed on 07/22/2010, essentially which showed a large heterogeneously enhancing mass of the right breast with washout kinetics measuring 6.4 x 4.4 x 5.0 cm.  Numerous satellite nodules were seen throughout the dominant mass.  There were several suspicious nodules in the upper central and upper inner quadrant of the right breast.  There were two enhancing subcentimeter nodules seen in the right pectoralis muscle.  No obvious chest wall nodules were seen; however, there was seen some metastatic adenopathy in the mediastinum as well as hila with a 2.4 x 2.0 cm mediastinal lymph node as well as 1.2 x 3.0 cm subcarinal lymph node.  There was a right hilar and  probable left hilar adenopathy.  Bilateral pulmonary nodules were seen and a right T2 lesion was seen anterior right liver as well."  Her subsequent history is as detailed below.  INTERVAL HISTORY: Nicole [name] returns accompanied by her husband Nicole [name] for followup of her metastatic breast carcinoma. The interval history is unremarkable. She continues to work full-time. She is not exercising and even though she has quite a bit of exercise equipment at home and it is in the in a very favorable place so she can watch TV at the same time as she is getting treated. She is tolerating her letrozole with no significant problems in terms of hot flashes or vaginal dryness.  REVIEW OF SYSTEMS: Nicole [name] feels anxious, and I think this causes her muscles to cramp up a little she feels better when her husband massages her back or her cats. She does have some cramps at night to her she describes herself as mildly fatigued. She has some easy bruisability but of course she is on Coumadin. There has been no unusual bleeding. She has mild sinus symptoms related to the current weather. There is a little "bump" on the right anterior chest wall she wanted me to look at.Otherwise a detailed review of systems is noncontributory  PAST MEDICAL HISTORY: Past Medical History  Diagnosis Date  . IBD (inflammatory bowel disease)     resolved  . Heart murmur     mild, no cardiologist  . Blood transfusion 2012  . Breast CA  breast ca dx 06/2010, stage 4, right   . Radiation 07/21/12-09/07/12    5940 cGy    PAST SURGICAL HISTORY: Past Surgical History  Procedure Laterality Date  . Portacath placement  2011    left side  . Dilation and curettage of uterus  2004?  [name] Mastectomy  06/02/12    right breast    FAMILY HISTORY Family History  Problem Relation Age of Onset  . Cancer Neg Hx    patient's father died at the age of 80 from heart disease. Patient's mother died at the age of 66 status post CABG. The patient had no brothers,  2 sisters. There is no history of breast or ovarian cancer in the family.  GYNECOLOGIC HISTORY: Menarche age 77, she is GX P0. She does not recall when she went through menopause. She never took hormone replacement.  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 Husband Nicole [name] is retired from [employer]. They have no children and no pets. They attend a local [address].   ADVANCED DIRECTIVES: Not in place  HEALTH MAINTENANCE: History  Substance Use Topics  . Smoking status: Never Smoker   . Smokeless tobacco: Never Used  . Alcohol Use: No     Colonoscopy:  PAP:  Bone density: 01/28/2011, osteoporosis  Lipid panel:  No Known Allergies  Current Outpatient Prescriptions  Medication Sig Dispense Refill  . alendronate (FOSAMAX) 70 MG tablet TAKE 1 TABLET ONCE A WEEK  12 tablet  0  . alendronate (FOSAMAX) 70 MG tablet TAKE 1 TABLET ONCE A WEEK  12 tablet  1  . Calcium Carbonate (CALTRATE 600 PO) Take by mouth 2 (two) times daily.       . Cholecalciferol (VITAMIN D PO) Take 2,000 Units by mouth once.       [name] letrozole (FEMARA) 2.5 MG tablet TAKE 1 TABLET BY MOUTH EVERY DAY  30 tablet  3  . venlafaxine XR (EFFEXOR-XR) 75 MG 24 hr capsule Take 1 capsule (75 mg total) by mouth daily.  90 capsule  1  . warfarin (COUMADIN) 2 MG tablet TAKE 1 TAB EVERY OTHER DAY, ALT WITH 2 TABS EVERY OTHER DAY, OR AS DIRECTED BY COUMADIN CLINIC.  60 tablet  1   No current facility-administered medications for this visit.    OBJECTIVE: Middle-aged white woman who appears well Filed Vitals:   09/30/13 1000  BP: 146/93  Pulse: 96  Temp: 98.2 F (36.8 C)  Resp: 18     Body mass index is 24.17 kg/(m^2).    ECOG FS: 0 Filed Weights   09/30/13 1000  Weight: 140 lb 14.4 oz (63.912 kg)   Sclerae unicteric Oropharynx clear No cervical or supraclavicular adenopathy Lungs clear to auscultation bilaterally Heart regular rate and rhythm, no murmur  appreciated Abdomen soft, nontender, positive bowel sounds  MSK no focal spinal tenderness  Minimal right upper extremity lymphedema, unchanged Neuro: nonfocal, well oriented, pleasant affect Breasts: The right breast is status post mastectomy. The "bump" on the right anterior chest wall is simply her second rib. It is not tender to vigorous palpation and percussion. The right axilla is benign There is no evidence of local recurrence.  The left breast is unremarkable.    LAB RESULTS: Lab Results  Component Value Date   WBC 3.5* 09/20/2013   NEUTROABS 2.4 09/20/2013   HGB 13.4 09/20/2013   HCT 38.9 09/20/2013   MCV 85.1 09/20/2013   PLT 94* 09/20/2013  Chemistry      Component Value Date/Time   NA 141 09/20/2013 0924   NA 140 08/21/2012 1307   K 3.9 09/20/2013 0924   K 3.9 08/21/2012 1307   CL 109* 04/20/2013 0859   CL 105 08/21/2012 1307   CO2 19* 09/20/2013 0924   CO2 24 08/21/2012 1307   BUN 16.5 09/20/2013 0924   BUN 15 08/21/2012 1307   CREATININE 0.9 09/20/2013 0924   CREATININE 0.93 08/21/2012 1307      Component Value Date/Time   CALCIUM 8.8 09/20/2013 0924   CALCIUM 9.2 08/21/2012 1307   ALKPHOS 64 09/20/2013 0924   ALKPHOS 59 08/21/2012 1307   AST 21 09/20/2013 0924   AST 24 08/21/2012 1307   ALT 20 09/20/2013 0924   ALT 30 08/21/2012 1307   BILITOT 0.61 09/20/2013 0924   BILITOT 0.6 08/21/2012 1307        STUDIES: Dg Bone Density  09/27/2013   *RADIOLOGY REPORT*  Clinical Data: 57 year old postmenopausal female taking Fosamax.  DUAL X-RAY ABSORPTIOMETRY (DXA) FOR BONE MINERAL DENSITY  AP LUMBAR SPINE (L1-L4)  Bone Mineral Density (BMD):            0.843 g/cm2 Young Adult T Score:                          -1.9 Z Score:                                                -0.5  LEFT FEMUR NECK  Bone Mineral Density (BMD):             0.543 g/cm2 Young Adult T Score:                           -2.8 Z Score:                                                 -1.5  ASSESSMENT:  Patient's  diagnostic category is OSTEOPOROSIS by WHO Criteria.  FRACTURE RISK: UNKNOWN IN THIS PATIENT TAKING FOSAMAX.  FRAX: World Health Organization FRAX assessment of absolute fracture risk is not calculated for this patient because the patient has osteoporosis and a history of bone building therapy.  Comparison: The bone mineral density of the lumbar spine has increased by 6.3% and the bone mineral density of the left hip has increased by 3.9% when compared to the, 01/28/2011 study.  RECOMMENDATIONS:  All patients should ensure an adequate intake of dietary calcium (1200mg  daily) and vitamin D (800 IU daily) unless contraindicated. The National Osteoporosis Foundation recommends that FDA-approved medical therapies be considered in postmenopausal women and mean age 66 or older with a:  1)    Hip or vertebral (clinical or morphometric) fracture.  2)   T-score of -2.5 or lower at the spine or hip.  3)   Ten-year fracture probability by FRAX of 3% or greater for hip fracture or 20% or greater for major osteoporotic fracture.  FOLLOW-UP:  People with diagnosed cases of osteoporosis or at high risk for fracture should have regular bone mineral density tests.  For patients eligible for Medicare, routine testing is  allowed once every 2 years.  The testing frequency can be increased to one year for patients who have rapidly progressing disease, those who are receiving or discontinuing medical therapy to restore bone mass, or have additional risk factors.   Original Report Authenticated By: Baird Lyons, M.D.   Mm Digital Screening Unilat L  09/27/2013   *RADIOLOGY REPORT*  Clinical Data: Screening.  DIGITAL SCREENING UNILATERAL LEFT MAMMOGRAM WITH CAD  Comparison:  Previous exam(s).  FINDINGS:  ACR Breast Density Category c:  The breast tissue is heterogeneously dense, which may obscure small masses.  There are no findings suspicious for malignancy.  Images were processed with CAD.  IMPRESSION: No mammographic evidence of  malignancy.  A result letter of this screening mammogram will be mailed directly to the patient.  RECOMMENDATION: Screening mammogram in one year. (Code:SM-B-01Y)  BI-RADS CATEGORY 1:  Negative.   Original Report Authenticated By: Baird Lyons, M.D.     ASSESSMENT: 57 y.o. [place] woman presenting June 2011 with stage IV breast cancer involving the right breast, right axilla, mediastinal lymph nodes, and both lungs, but not the brain, liver, or bones  (1) positive right breast and right axillary lymph node biopsies 07/17/2010 of a clinical T4 N2 M1 invasive ductal carcinoma, grade 1, estrogen receptor 100% positive, progesterone receptor 6% positive, with an MIB-1 of 12%, and no HER-2 amplification.  (2) participated in Phase II tessetaxel study, receiving 2 cycles complicated by thrombocytopenia, anemia requiring transfusion, transaminase elevation, and afebrile neutropenia. Off-study as of September 2011. Chest CT scan September 2011 did show evidence of response.  (3) on letrozole as of October 2011, with continuing response  (4) 06/02/2012 underwent right mastectomy and axillary lymph node sampling (3 lymph nodes removed, all with viable cancer as well as evidence of treatment effect) for a ypT2 ypN1-2 invasive ductal carcinoma, grade 1, 98% estrogen receptor positive, progesterone receptor negative, with no HER-2 amplification  (5) left jugular vein DVT documented 06/16/2012; left sided Port-A-Cath in place,  On Coumadin.   PLAN:  Ramata is doing very well as far as her breast cancer is concerned and there is no indication of disease progression at this point. We discussed her mammogram and her bone density, which is clearly improved on alendronate  She does remain very anxious, and this is causing her to cramp, and feels tense, and this is affecting her quality of life to some extent. Today we discussed her exercising vigorously 45 minutes 5 times a week, which I think will help a great  deal. She will continue on venlafaxine at the current dose. I also gave her some information on [employer], and basically that or any similar "[place] medication-based exercise program" I will be a very good complement for her. She will let him know she is doing as far as anxiety is concerned when she returns to see me in December. Before that visit she will have a chest CT for restaging. She will also have port flushes November 3 and December 15.  Kayelynn Abdou C    09/30/2013

## 2013-09-30 NOTE — Telephone Encounter (Signed)
, °

## 2013-10-16 ENCOUNTER — Other Ambulatory Visit: Payer: Self-pay | Admitting: Oncology

## 2013-10-25 ENCOUNTER — Other Ambulatory Visit: Payer: BC Managed Care – PPO

## 2013-10-25 ENCOUNTER — Ambulatory Visit: Payer: BC Managed Care – PPO

## 2013-11-01 ENCOUNTER — Other Ambulatory Visit (HOSPITAL_BASED_OUTPATIENT_CLINIC_OR_DEPARTMENT_OTHER): Payer: BC Managed Care – PPO | Admitting: Lab

## 2013-11-01 ENCOUNTER — Ambulatory Visit (HOSPITAL_BASED_OUTPATIENT_CLINIC_OR_DEPARTMENT_OTHER): Payer: BC Managed Care – PPO

## 2013-11-01 ENCOUNTER — Other Ambulatory Visit: Payer: Self-pay | Admitting: *Deleted

## 2013-11-01 ENCOUNTER — Ambulatory Visit (HOSPITAL_BASED_OUTPATIENT_CLINIC_OR_DEPARTMENT_OTHER): Payer: Self-pay | Admitting: Pharmacist

## 2013-11-01 VITALS — BP 155/95 | HR 92 | Temp 97.6°F | Resp 18

## 2013-11-01 DIAGNOSIS — I82402 Acute embolism and thrombosis of unspecified deep veins of left lower extremity: Secondary | ICD-10-CM

## 2013-11-01 DIAGNOSIS — C50411 Malignant neoplasm of upper-outer quadrant of right female breast: Secondary | ICD-10-CM

## 2013-11-01 DIAGNOSIS — Z452 Encounter for adjustment and management of vascular access device: Secondary | ICD-10-CM

## 2013-11-01 DIAGNOSIS — I82409 Acute embolism and thrombosis of unspecified deep veins of unspecified lower extremity: Secondary | ICD-10-CM

## 2013-11-01 DIAGNOSIS — C50419 Malignant neoplasm of upper-outer quadrant of unspecified female breast: Secondary | ICD-10-CM

## 2013-11-01 DIAGNOSIS — Z95828 Presence of other vascular implants and grafts: Secondary | ICD-10-CM

## 2013-11-01 LAB — CBC WITH DIFFERENTIAL/PLATELET
Basophils Absolute: 0 10*3/uL (ref 0.0–0.1)
EOS%: 0 % (ref 0.0–7.0)
HCT: 40.6 % (ref 34.8–46.6)
HGB: 13.8 g/dL (ref 11.6–15.9)
MCH: 29.1 pg (ref 25.1–34.0)
MCV: 85.9 fL (ref 79.5–101.0)
MONO%: 7 % (ref 0.0–14.0)
NEUT%: 70.8 % (ref 38.4–76.8)
Platelets: 100 10*3/uL — ABNORMAL LOW (ref 145–400)
RBC: 4.72 10*6/uL (ref 3.70–5.45)
WBC: 4.2 10*3/uL (ref 3.9–10.3)

## 2013-11-01 LAB — COMPREHENSIVE METABOLIC PANEL (CC13)
AST: 25 U/L (ref 5–34)
Alkaline Phosphatase: 66 U/L (ref 40–150)
BUN: 10.1 mg/dL (ref 7.0–26.0)
Calcium: 9.2 mg/dL (ref 8.4–10.4)
Chloride: 109 mEq/L (ref 98–109)
Creatinine: 0.9 mg/dL (ref 0.6–1.1)
Sodium: 142 mEq/L (ref 136–145)
Total Bilirubin: 0.56 mg/dL (ref 0.20–1.20)

## 2013-11-01 LAB — POCT INR: INR: 2.8

## 2013-11-01 LAB — PROTIME-INR: INR: 2.8 (ref 2.00–3.50)

## 2013-11-01 MED ORDER — HEPARIN SOD (PORK) LOCK FLUSH 100 UNIT/ML IV SOLN
500.0000 [IU] | Freq: Once | INTRAVENOUS | Status: AC
  Administered 2013-11-01: 500 [IU] via INTRAVENOUS
  Filled 2013-11-01: qty 5

## 2013-11-01 MED ORDER — SODIUM CHLORIDE 0.9 % IJ SOLN
10.0000 mL | INTRAMUSCULAR | Status: DC | PRN
  Administered 2013-11-01: 10 mL via INTRAVENOUS
  Filled 2013-11-01: qty 10

## 2013-11-01 NOTE — Progress Notes (Signed)
INR at goal Pt is now stable on current dose Pt reports no complications regarding anticoagulation No missed/extra doses No diet/medication changes Will make No changes to current regimen.  Plan: Continue current dose; 3mg  alternating with 4mg . Repeat INR on 12/13/13 at 815am for lab, 8:30 am for flush, 8:45am coumadin clinic, and 9am MD visit with Dr. Darnelle Catalan

## 2013-11-01 NOTE — Patient Instructions (Signed)
INR at goal No changes Continue current dose; 3mg  alternating with 4mg . Repeat INR on 12/13/13 at 815am for lab, 8:30 am for flush, 8:45am coumadin clinic, and 9am MD visit with Dr. Darnelle Catalan

## 2013-11-11 ENCOUNTER — Other Ambulatory Visit: Payer: Self-pay | Admitting: Oncology

## 2013-11-11 DIAGNOSIS — I82409 Acute embolism and thrombosis of unspecified deep veins of unspecified lower extremity: Secondary | ICD-10-CM

## 2013-12-09 ENCOUNTER — Ambulatory Visit (HOSPITAL_COMMUNITY)
Admission: RE | Admit: 2013-12-09 | Discharge: 2013-12-09 | Disposition: A | Discharge status: Home / Self Care | Payer: BC Managed Care – PPO | Source: Ambulatory Visit | Attending: Oncology | Admitting: Oncology

## 2013-12-09 ENCOUNTER — Encounter (HOSPITAL_COMMUNITY): Payer: Self-pay

## 2013-12-09 ENCOUNTER — Telehealth: Payer: Self-pay | Admitting: Oncology

## 2013-12-09 DIAGNOSIS — C50919 Malignant neoplasm of unspecified site of unspecified female breast: Secondary | ICD-10-CM | POA: Insufficient documentation

## 2013-12-09 MED ORDER — IOHEXOL 300 MG/ML  SOLN
80.0000 mL | Freq: Once | INTRAMUSCULAR | Status: AC | PRN
  Administered 2013-12-09: 80 mL via INTRAVENOUS

## 2013-12-09 NOTE — Telephone Encounter (Signed)
, °

## 2013-12-12 ENCOUNTER — Other Ambulatory Visit: Payer: Self-pay | Admitting: Physician Assistant

## 2013-12-13 ENCOUNTER — Ambulatory Visit (HOSPITAL_BASED_OUTPATIENT_CLINIC_OR_DEPARTMENT_OTHER): Payer: BC Managed Care – PPO | Admitting: Oncology

## 2013-12-13 ENCOUNTER — Ambulatory Visit: Payer: BC Managed Care – PPO | Admitting: Pharmacist

## 2013-12-13 ENCOUNTER — Other Ambulatory Visit (HOSPITAL_BASED_OUTPATIENT_CLINIC_OR_DEPARTMENT_OTHER): Payer: BC Managed Care – PPO

## 2013-12-13 VITALS — BP 171/97 | HR 96 | Temp 97.9°F | Resp 18 | Ht 64.0 in | Wt 143.2 lb

## 2013-12-13 DIAGNOSIS — C50419 Malignant neoplasm of upper-outer quadrant of unspecified female breast: Secondary | ICD-10-CM

## 2013-12-13 DIAGNOSIS — C773 Secondary and unspecified malignant neoplasm of axilla and upper limb lymph nodes: Secondary | ICD-10-CM

## 2013-12-13 DIAGNOSIS — C7800 Secondary malignant neoplasm of unspecified lung: Secondary | ICD-10-CM

## 2013-12-13 DIAGNOSIS — I82409 Acute embolism and thrombosis of unspecified deep veins of unspecified lower extremity: Secondary | ICD-10-CM

## 2013-12-13 DIAGNOSIS — I890 Lymphedema, not elsewhere classified: Secondary | ICD-10-CM

## 2013-12-13 DIAGNOSIS — C50919 Malignant neoplasm of unspecified site of unspecified female breast: Secondary | ICD-10-CM

## 2013-12-13 DIAGNOSIS — I82402 Acute embolism and thrombosis of unspecified deep veins of left lower extremity: Secondary | ICD-10-CM

## 2013-12-13 DIAGNOSIS — M810 Age-related osteoporosis without current pathological fracture: Secondary | ICD-10-CM

## 2013-12-13 DIAGNOSIS — C50411 Malignant neoplasm of upper-outer quadrant of right female breast: Secondary | ICD-10-CM

## 2013-12-13 LAB — CBC WITH DIFFERENTIAL/PLATELET
BASO%: 0.6 % (ref 0.0–2.0)
Basophils Absolute: 0 10*3/uL (ref 0.0–0.1)
Eosinophils Absolute: 0 10*3/uL (ref 0.0–0.5)
HCT: 40.7 % (ref 34.8–46.6)
HGB: 13.9 g/dL (ref 11.6–15.9)
LYMPH%: 22.5 % (ref 14.0–49.7)
MCHC: 34.1 g/dL (ref 31.5–36.0)
MCV: 86.1 fL (ref 79.5–101.0)
MONO#: 0.2 10*3/uL (ref 0.1–0.9)
MONO%: 7.1 % (ref 0.0–14.0)
NEUT#: 2.4 10*3/uL (ref 1.5–6.5)
NEUT%: 69.8 % (ref 38.4–76.8)
WBC: 3.5 10*3/uL — ABNORMAL LOW (ref 3.9–10.3)
lymph#: 0.8 10*3/uL — ABNORMAL LOW (ref 0.9–3.3)

## 2013-12-13 LAB — COMPREHENSIVE METABOLIC PANEL (CC13)
ALT: 24 U/L (ref 0–55)
Anion Gap: 11 mEq/L (ref 3–11)
CO2: 22 mEq/L (ref 22–29)
Calcium: 9.3 mg/dL (ref 8.4–10.4)
Chloride: 108 mEq/L (ref 98–109)
Creatinine: 1 mg/dL (ref 0.6–1.1)
Total Bilirubin: 0.72 mg/dL (ref 0.20–1.20)

## 2013-12-13 LAB — PROTIME-INR

## 2013-12-13 NOTE — Progress Notes (Signed)
ID: Nicole [name]   DOB: 26-Feb-1956  MR#: [id number]  XBM#:[id number]  PCP:  GYN:  SU: Nicole [name] OTHER MD: Nicole [name]   HISTORY OF PRESENT ILLNESS: From Dr. Doreatha [name] 07/25/2010 note:  "This woman has not had any significant medical intervention for some time.  Her last mammogram was about seven years ago.  She noted a right breast mass about a year ago and did not seek immediate medical attention for this.  She ultimately, after some time, admitted this to her husband and self-referred herself for intervention.  She had a mammogram on 07/17/2010 with an ultrasound of the right breast.  This showed a lobulated mass upper right outer quadrant measuring at least 6.3 x 7.3 cm and large right axillary lymph nodes were also noted.  There was skin thickening overlying the mass.  On physical exam, this mass was about an 8 cm with skin dimpling noted. Discolored area in the skin over the mass, fullness in the axilla.  Biopsy was recommended, which took place on 07/17/2010.  Pathology showed invasive ductal cancer involving both lymph node and breast.  The HER-2 was not amplified.  ER and PR both positive at 100% and 6% respectively.  Proliferative index was 12% involving both the lymph node and breast mass.  An MRI scan of both breasts was performed on 07/22/2010, essentially which showed a large heterogeneously enhancing mass of the right breast with washout kinetics measuring 6.4 x 4.4 x 5.0 cm.  Numerous satellite nodules were seen throughout the dominant mass.  There were several suspicious nodules in the upper central and upper inner quadrant of the right breast.  There were two enhancing subcentimeter nodules seen in the right pectoralis muscle.  No obvious chest wall nodules were seen; however, there was seen some metastatic adenopathy in the mediastinum as well as hila with a 2.4 x 2.0 cm mediastinal lymph node as well as 1.2 x 3.0 cm subcarinal lymph node.  There was a right hilar and  probable left hilar adenopathy.  Bilateral pulmonary nodules were seen and a right T2 lesion was seen anterior right liver as well."  Her subsequent history is as detailed below.  INTERVAL HISTORY: Nicole [name] returns accompanied by her husband Nicole [name] for followup of her metastatic breast carcinoma. The interval history is unremarkable. Unfortunately she never did start an exercise program. We discussed that again at length today. The good news of course is that her chest CT scan remains stable.   REVIEW OF SYSTEMS: Nicole [name] just "works works works" and has not had time to explore the live strong or other suggestions that we made. As a result she remains fairly anxious. She complains of family and friends suggesting she consider different treatments, have her tumor sequenced, or seek second and third opinions. She tells me it she has some chocolate at night she gets a headache in the morning. Otherwise there have been no unusual headaches, visual changes, nausea, vomiting, dizziness, or gait imbalance. She has had mild sinus symptoms but no cough, phlegm production, pleurisy or hemoptysis. There has been no shortness of breath. She has had no pain, fever, rash, and no bleeding. There is minimal left upper extremity lymphedema, which is really not appreciable on casual inspection. A detailed review of systems today was otherwise stable.  PAST MEDICAL HISTORY: Past Medical History  Diagnosis Date  . IBD (inflammatory bowel disease)     resolved  . Heart murmur     mild, no cardiologist  . Blood  transfusion 2012  . Breast CA     breast ca dx 06/2010, stage 4, right   . Radiation 07/21/12-09/07/12    5940 cGy    PAST SURGICAL HISTORY: Past Surgical History  Procedure Laterality Date  . Portacath placement  2011    left side  . Dilation and curettage of uterus  2004?  [name] Mastectomy  06/02/12    right breast    FAMILY HISTORY Family History  Problem Relation Age of Onset  . Cancer Neg Hx    patient's  father died at the age of 12 from heart disease. Patient's mother died at the age of 87 status post CABG. The patient had no brothers, 2 sisters. There is no history of breast or ovarian cancer in the family.  GYNECOLOGIC HISTORY: Menarche age 61, she is GX P0. She does not recall when she went through menopause. She never took hormone replacement.  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 Husband Nicole [name] is retired from [employer]. They have no children and no pets. They attend a local [address].   ADVANCED DIRECTIVES: Not in place  HEALTH MAINTENANCE: History  Substance Use Topics  . Smoking status: Never Smoker   . Smokeless tobacco: Never Used  . Alcohol Use: No     Colonoscopy:  PAP:  Bone density: 01/28/2011, osteoporosis  Lipid panel:  No Known Allergies  Current Outpatient Prescriptions  Medication Sig Dispense Refill  . alendronate (FOSAMAX) 70 MG tablet TAKE 1 TABLET ONCE A WEEK  12 tablet  1  . Calcium Carbonate (CALTRATE 600 PO) Take by mouth 2 (two) times daily.       . Cholecalciferol (VITAMIN D PO) Take 2,000 Units by mouth once.       [name] letrozole (FEMARA) 2.5 MG tablet TAKE 1 TABLET BY MOUTH EVERY DAY  30 tablet  3  . venlafaxine XR (EFFEXOR-XR) 75 MG 24 hr capsule Take 1 capsule (75 mg total) by mouth daily.  90 capsule  1  . warfarin (COUMADIN) 2 MG tablet TAKE 1 TAB EVERY OTHER DAY, ALT WITH 2 TABS EVERY OTHER DAY, OR AS DIRECTED BY COUMADIN CLINIC.  60 tablet  1   No current facility-administered medications for this visit.    OBJECTIVE: Middle-aged white woman in no acute distress Filed Vitals:   12/13/13 0908  BP: 171/97  Pulse: 96  Temp: 97.9 F (36.6 C)  Resp: 18     Body mass index is 24.57 kg/(m^2).    ECOG FS: 0 Filed Weights   12/13/13 0908  Weight: 143 lb 3.2 oz (64.955 kg)   Sclerae unicteric, pupils round and equal Oropharynx clear, good dentition No cervical or supraclavicular  adenopathy Lungs clear to auscultation bilaterally Heart regular rate and rhythm, no murmur appreciated Abdomen soft, nontender, positive bowel sounds  MSK no focal spinal tenderness, no obvious left upper extremity lymphedema  Neuro: nonfocal, well oriented, anxious affect Breasts: The right breast is status post mastectomy. There are significant ecchymoses secondary to her radiation treatments. There is no evidence of local recurrence. The right axilla is benign.  The left breast is unremarkable.    LAB RESULTS: Lab Results  Component Value Date   WBC 3.5* 12/13/2013   NEUTROABS 2.4 12/13/2013   HGB 13.9 12/13/2013   HCT 40.7 12/13/2013   MCV 86.1 12/13/2013   PLT 88* 12/13/2013      Chemistry      Component Value Date/Time   NA  142 11/01/2013 0944   NA 140 08/21/2012 1307   K 3.9 11/01/2013 0944   K 3.9 08/21/2012 1307   CL 109* 04/20/2013 0859   CL 105 08/21/2012 1307   CO2 22 11/01/2013 0944   CO2 24 08/21/2012 1307   BUN 10.1 11/01/2013 0944   BUN 15 08/21/2012 1307   CREATININE 0.9 11/01/2013 0944   CREATININE 0.93 08/21/2012 1307      Component Value Date/Time   CALCIUM 9.2 11/01/2013 0944   CALCIUM 9.2 08/21/2012 1307   ALKPHOS 66 11/01/2013 0944   ALKPHOS 59 08/21/2012 1307   AST 25 11/01/2013 0944   AST 24 08/21/2012 1307   ALT 27 11/01/2013 0944   ALT 30 08/21/2012 1307   BILITOT 0.56 11/01/2013 0944   BILITOT 0.6 08/21/2012 1307        STUDIES: Ct Chest W Contrast  12/09/2013   CLINICAL DATA:  Breast cancer followup  EXAM: CT CHEST WITH CONTRAST  TECHNIQUE: Multidetector CT imaging of the chest was performed during intravenous contrast administration.  CONTRAST:  80mL OMNIPAQUE IOHEXOL 300 MG/ML  SOLN  COMPARISON:  None  FINDINGS: There is no pleural effusion. Subpleural radiation changes identified within the posterior lateral right upper lobe. Patchy areas of ground-glass attenuation are again identified within the right lung, image 32/series 5, image 35/series 5, and  image 36/series 5. This is stable compared with the previous exam. No solid appearing nodules are identified.  The trachea is patent and is midline. The heart size is normal. No pericardial effusion identified. There is no enlarged mediastinal or hilar lymph nodes identified. No axillary or supraclavicular adenopathy. There is a left chest wall port a catheter with tip in the cavoatrial junction. A right mastectomy has been performed.  Incidental imaging through the upper abdomen shows a low attenuation structure in the posterior right hepatic lobe measuring 1.7 cm, image 60/series 2. The left adrenal gland is normal. Normal appearance of the pancreas.  IMPRESSION: 1. Stable CT of the chest. 2. Previously described patchy areas of ground-glass attenuation in the right lung are unchanged from previous exam.   Electronically Signed   By: Signa Kell M.D.   On: 12/09/2013 12:21     ASSESSMENT: 57 y.o. [place] woman presenting June 2011 with stage IV breast cancer involving the right breast, right axilla, mediastinal lymph nodes, and both lungs, but not the brain, liver, or bones  (1) positive right breast and right axillary lymph node biopsies 07/17/2010 of a clinical T4 N2 M1 invasive ductal carcinoma, grade 1, estrogen receptor 100% positive, progesterone receptor 6% positive, with an MIB-1 of 12%, and no HER-2 amplification.  (2) participated in Phase II tessetaxel study, receiving 2 cycles complicated by thrombocytopenia, anemia requiring transfusion, transaminase elevation, and afebrile neutropenia. Off-study as of September 2011. Chest CT scan September 2011 did show evidence of response.  (3) on letrozole as of October 2011, with continuing response  (4) 06/02/2012 underwent right mastectomy and axillary lymph node sampling (3 lymph nodes removed, all with viable cancer as well as evidence of treatment effect) for a ypT2 ypN1-2 invasive ductal carcinoma, grade 1, 98% estrogen receptor positive,  progesterone receptor negative, with no HER-2 amplification  (5) left jugular vein DVT documented 06/16/2012; left sided Port-A-Cath in place,  On Coumadin.  (6) osteoporosis, on alendronate   PLAN:  Mckinsley is doing very well as far as her breast cancer is concerned and there is no indication of disease progression at this point. She has no  symptoms related to her cancer at present. She is tolerating her letrozole well.  I suggested she consider getting rid of report, which would allow [place] to stop her Coumadin and 3 months after port removal. However she does not feel quite ready to do this and "wants to think about it". Accordingly we will continue to flush her port we will do that on a once a month basis when she comes for Coumadin clinic  I again encouraged her strongly to exercise, the goal being 45 minutes 5 times a week. I gave her the live strong pamphlet. We discussed whole gene sequencing and she understands if she develops active disease we will biopsy it and send some of it for [facility]. However she is doing terrific her letrozole and it is certainly better not to have active disease to biopsy then to exercise the sequencing option.  Ladene will see [place] again in 3 months. We will repeat a chest CT in 6 months. She knows to call for any problems that may develop before her next visit here. Cristen Bredeson C    12/13/2013

## 2013-12-13 NOTE — Telephone Encounter (Signed)
appts made and printed...td 

## 2013-12-13 NOTE — Patient Instructions (Signed)
INR right at goal No changes Continue current dose; 3mg  alternating with 4mg . Repeat INR on 01/24/14 at 845am for lab, 9:00 am for coumadin clinic Have some greens tonight!

## 2013-12-13 NOTE — Progress Notes (Signed)
INR right at goal Pt seen today in coumadin clinic prior to visit with Dr. Darnelle Catalan Pt is anxious regarding CT scan results but she is doing well with no complaints No unusual bleeding or bruising No missed or extra doses No medication changes Pt states she has not had as many salads or "greens" the last few weeks which could have resulted in her INR trending up For now will make no changes Continue current dose; 3mg  alternating with 4mg . Repeat INR in 6 weeks on 01/24/14 at 845am for lab, 9:00 am for coumadin clinic (if flush appointment scheduled for January will make coumadin lab on this same day as flush appointment).

## 2014-01-03 ENCOUNTER — Other Ambulatory Visit: Payer: Self-pay | Admitting: Oncology

## 2014-01-24 ENCOUNTER — Ambulatory Visit (HOSPITAL_BASED_OUTPATIENT_CLINIC_OR_DEPARTMENT_OTHER): Payer: BC Managed Care – PPO

## 2014-01-24 ENCOUNTER — Ambulatory Visit: Payer: BC Managed Care – PPO | Admitting: Pharmacist

## 2014-01-24 VITALS — BP 155/101 | HR 86 | Temp 98.3°F

## 2014-01-24 DIAGNOSIS — Z95828 Presence of other vascular implants and grafts: Secondary | ICD-10-CM

## 2014-01-24 DIAGNOSIS — C50419 Malignant neoplasm of upper-outer quadrant of unspecified female breast: Secondary | ICD-10-CM

## 2014-01-24 DIAGNOSIS — I82409 Acute embolism and thrombosis of unspecified deep veins of unspecified lower extremity: Secondary | ICD-10-CM

## 2014-01-24 DIAGNOSIS — C773 Secondary and unspecified malignant neoplasm of axilla and upper limb lymph nodes: Secondary | ICD-10-CM

## 2014-01-24 DIAGNOSIS — C50411 Malignant neoplasm of upper-outer quadrant of right female breast: Secondary | ICD-10-CM

## 2014-01-24 DIAGNOSIS — C7800 Secondary malignant neoplasm of unspecified lung: Secondary | ICD-10-CM

## 2014-01-24 DIAGNOSIS — I890 Lymphedema, not elsewhere classified: Secondary | ICD-10-CM

## 2014-01-24 LAB — CBC WITH DIFFERENTIAL/PLATELET
BASO%: 0.5 % (ref 0.0–2.0)
Basophils Absolute: 0 10*3/uL (ref 0.0–0.1)
EOS ABS: 0 10*3/uL (ref 0.0–0.5)
EOS%: 0 % (ref 0.0–7.0)
HCT: 39.8 % (ref 34.8–46.6)
HGB: 13.7 g/dL (ref 11.6–15.9)
LYMPH#: 0.9 10*3/uL (ref 0.9–3.3)
LYMPH%: 19.8 % (ref 14.0–49.7)
MCH: 29.3 pg (ref 25.1–34.0)
MCHC: 34.3 g/dL (ref 31.5–36.0)
MCV: 85.5 fL (ref 79.5–101.0)
MONO#: 0.3 10*3/uL (ref 0.1–0.9)
MONO%: 7.8 % (ref 0.0–14.0)
NEUT%: 71.9 % (ref 38.4–76.8)
NEUTROS ABS: 3.1 10*3/uL (ref 1.5–6.5)
Platelets: 90 10*3/uL — ABNORMAL LOW (ref 145–400)
RBC: 4.66 10*6/uL (ref 3.70–5.45)
RDW: 14.9 % — AB (ref 11.2–14.5)
WBC: 4.4 10*3/uL (ref 3.9–10.3)

## 2014-01-24 LAB — COMPREHENSIVE METABOLIC PANEL (CC13)
ALBUMIN: 4.1 g/dL (ref 3.5–5.0)
ALK PHOS: 61 U/L (ref 40–150)
ALT: 26 U/L (ref 0–55)
AST: 22 U/L (ref 5–34)
Anion Gap: 12 mEq/L — ABNORMAL HIGH (ref 3–11)
BILIRUBIN TOTAL: 0.76 mg/dL (ref 0.20–1.20)
BUN: 18.2 mg/dL (ref 7.0–26.0)
CO2: 22 mEq/L (ref 22–29)
Calcium: 9.7 mg/dL (ref 8.4–10.4)
Chloride: 107 mEq/L (ref 98–109)
Creatinine: 1 mg/dL (ref 0.6–1.1)
Glucose: 93 mg/dl (ref 70–140)
POTASSIUM: 4 meq/L (ref 3.5–5.1)
SODIUM: 142 meq/L (ref 136–145)
TOTAL PROTEIN: 7 g/dL (ref 6.4–8.3)

## 2014-01-24 LAB — PROTIME-INR
INR: 2.3 (ref 2.00–3.50)
PROTIME: 27.6 s — AB (ref 10.6–13.4)

## 2014-01-24 LAB — POCT INR: INR: 2.3

## 2014-01-24 MED ORDER — WARFARIN SODIUM 2 MG PO TABS: ORAL_TABLET | ORAL | Status: DC

## 2014-01-24 MED ORDER — HEPARIN SOD (PORK) LOCK FLUSH 100 UNIT/ML IV SOLN
500.0000 [IU] | Freq: Once | INTRAVENOUS | Status: AC
  Administered 2014-01-24: 500 [IU] via INTRAVENOUS
  Filled 2014-01-24: qty 5

## 2014-01-24 MED ORDER — SODIUM CHLORIDE 0.9 % IJ SOLN
10.0000 mL | INTRAMUSCULAR | Status: DC | PRN
  Administered 2014-01-24: 10 mL via INTRAVENOUS
  Filled 2014-01-24: qty 10

## 2014-01-24 NOTE — Patient Instructions (Signed)

## 2014-01-24 NOTE — Progress Notes (Signed)
Pt seen in clinic today prior to her flush appmt INR=2.3 alternating 3 and 4 mg daily No changes to report. No new meds or s/s bleeding or clots RTC in approx 7 weeks with next flush appmt Continue current dose; 3mg  alternating with 4mg . Repeat INR on 03/07/14 at 830am for lab, 8:45 am for coumadin clinic and 9:00 flush

## 2014-01-24 NOTE — Patient Instructions (Signed)
Continue current dose; 3mg  alternating with 4mg . Repeat INR on 03/07/14 at 830am for lab, 8:45 am for coumadin clinic and 9:00 flush

## 2014-03-07 ENCOUNTER — Other Ambulatory Visit (HOSPITAL_BASED_OUTPATIENT_CLINIC_OR_DEPARTMENT_OTHER): Payer: BC Managed Care – PPO

## 2014-03-07 ENCOUNTER — Ambulatory Visit (HOSPITAL_BASED_OUTPATIENT_CLINIC_OR_DEPARTMENT_OTHER): Payer: BC Managed Care – PPO

## 2014-03-07 ENCOUNTER — Ambulatory Visit (HOSPITAL_BASED_OUTPATIENT_CLINIC_OR_DEPARTMENT_OTHER): Payer: Self-pay | Admitting: Pharmacist

## 2014-03-07 ENCOUNTER — Telehealth: Payer: Self-pay | Admitting: Physician Assistant

## 2014-03-07 VITALS — BP 166/93 | HR 107 | Temp 97.9°F

## 2014-03-07 DIAGNOSIS — C7800 Secondary malignant neoplasm of unspecified lung: Secondary | ICD-10-CM

## 2014-03-07 DIAGNOSIS — I890 Lymphedema, not elsewhere classified: Secondary | ICD-10-CM

## 2014-03-07 DIAGNOSIS — C50411 Malignant neoplasm of upper-outer quadrant of right female breast: Secondary | ICD-10-CM

## 2014-03-07 DIAGNOSIS — I82409 Acute embolism and thrombosis of unspecified deep veins of unspecified lower extremity: Secondary | ICD-10-CM

## 2014-03-07 DIAGNOSIS — C50419 Malignant neoplasm of upper-outer quadrant of unspecified female breast: Secondary | ICD-10-CM

## 2014-03-07 DIAGNOSIS — Z95828 Presence of other vascular implants and grafts: Secondary | ICD-10-CM

## 2014-03-07 DIAGNOSIS — C773 Secondary and unspecified malignant neoplasm of axilla and upper limb lymph nodes: Secondary | ICD-10-CM

## 2014-03-07 LAB — CBC WITH DIFFERENTIAL/PLATELET
BASO%: 0.3 % (ref 0.0–2.0)
Basophils Absolute: 0 10*3/uL (ref 0.0–0.1)
EOS%: 0 % (ref 0.0–7.0)
Eosinophils Absolute: 0 10*3/uL (ref 0.0–0.5)
HEMATOCRIT: 40.5 % (ref 34.8–46.6)
HGB: 14.1 g/dL (ref 11.6–15.9)
LYMPH#: 0.9 10*3/uL (ref 0.9–3.3)
LYMPH%: 26.6 % (ref 14.0–49.7)
MCH: 29 pg (ref 25.1–34.0)
MCHC: 34.8 g/dL (ref 31.5–36.0)
MCV: 83.2 fL (ref 79.5–101.0)
MONO#: 0.4 10*3/uL (ref 0.1–0.9)
MONO%: 9.9 % (ref 0.0–14.0)
NEUT#: 2.2 10*3/uL (ref 1.5–6.5)
NEUT%: 63.2 % (ref 38.4–76.8)
PLATELETS: 94 10*3/uL — AB (ref 145–400)
RBC: 4.87 10*6/uL (ref 3.70–5.45)
RDW: 14.3 % (ref 11.2–14.5)
WBC: 3.5 10*3/uL — ABNORMAL LOW (ref 3.9–10.3)

## 2014-03-07 LAB — COMPREHENSIVE METABOLIC PANEL (CC13)
ALT: 23 U/L (ref 0–55)
AST: 20 U/L (ref 5–34)
Albumin: 4.1 g/dL (ref 3.5–5.0)
Alkaline Phosphatase: 61 U/L (ref 40–150)
Anion Gap: 12 mEq/L — ABNORMAL HIGH (ref 3–11)
BUN: 12.8 mg/dL (ref 7.0–26.0)
CO2: 23 meq/L (ref 22–29)
CREATININE: 1.1 mg/dL (ref 0.6–1.1)
Calcium: 9.4 mg/dL (ref 8.4–10.4)
Chloride: 109 mEq/L (ref 98–109)
GLUCOSE: 98 mg/dL (ref 70–140)
Potassium: 3.7 mEq/L (ref 3.5–5.1)
Sodium: 144 mEq/L (ref 136–145)
Total Bilirubin: 0.75 mg/dL (ref 0.20–1.20)
Total Protein: 7.3 g/dL (ref 6.4–8.3)

## 2014-03-07 LAB — PROTIME-INR
INR: 2.8 (ref 2.00–3.50)
Protime: 33.6 Seconds — ABNORMAL HIGH (ref 10.6–13.4)

## 2014-03-07 LAB — POCT INR: INR: 2.8

## 2014-03-07 MED ORDER — SODIUM CHLORIDE 0.9 % IJ SOLN
10.0000 mL | INTRAMUSCULAR | Status: DC | PRN
  Administered 2014-03-07: 10 mL via INTRAVENOUS
  Filled 2014-03-07: qty 10

## 2014-03-07 MED ORDER — HEPARIN SOD (PORK) LOCK FLUSH 100 UNIT/ML IV SOLN
500.0000 [IU] | Freq: Once | INTRAVENOUS | Status: AC
  Administered 2014-03-07: 500 [IU] via INTRAVENOUS
  Filled 2014-03-07: qty 5

## 2014-03-07 NOTE — Progress Notes (Signed)
INR continues to remain at goal of 2-3.  No changes in meds.  No bleeding/unusual bruising.  Will continue current coumadin dose of 3mg  alt with 4mg .  Will check PT/INR in 6 weeks with next flush.

## 2014-03-14 ENCOUNTER — Telehealth: Payer: Self-pay | Admitting: Oncology

## 2014-03-14 ENCOUNTER — Ambulatory Visit (HOSPITAL_BASED_OUTPATIENT_CLINIC_OR_DEPARTMENT_OTHER): Payer: BC Managed Care – PPO | Admitting: Physician Assistant

## 2014-03-14 ENCOUNTER — Encounter: Payer: Self-pay | Admitting: Physician Assistant

## 2014-03-14 VITALS — BP 151/87 | HR 92 | Temp 98.1°F | Resp 18 | Ht 64.0 in | Wt 143.8 lb

## 2014-03-14 DIAGNOSIS — C7800 Secondary malignant neoplasm of unspecified lung: Secondary | ICD-10-CM

## 2014-03-14 DIAGNOSIS — M810 Age-related osteoporosis without current pathological fracture: Secondary | ICD-10-CM

## 2014-03-14 DIAGNOSIS — R5381 Other malaise: Secondary | ICD-10-CM

## 2014-03-14 DIAGNOSIS — C50411 Malignant neoplasm of upper-outer quadrant of right female breast: Secondary | ICD-10-CM

## 2014-03-14 DIAGNOSIS — Z1231 Encounter for screening mammogram for malignant neoplasm of breast: Secondary | ICD-10-CM

## 2014-03-14 DIAGNOSIS — I82409 Acute embolism and thrombosis of unspecified deep veins of unspecified lower extremity: Secondary | ICD-10-CM

## 2014-03-14 DIAGNOSIS — C50919 Malignant neoplasm of unspecified site of unspecified female breast: Secondary | ICD-10-CM | POA: Insufficient documentation

## 2014-03-14 DIAGNOSIS — R5383 Other fatigue: Secondary | ICD-10-CM

## 2014-03-14 DIAGNOSIS — C50419 Malignant neoplasm of upper-outer quadrant of unspecified female breast: Secondary | ICD-10-CM

## 2014-03-14 DIAGNOSIS — G4700 Insomnia, unspecified: Secondary | ICD-10-CM

## 2014-03-14 DIAGNOSIS — I82C19 Acute embolism and thrombosis of unspecified internal jugular vein: Secondary | ICD-10-CM

## 2014-03-14 DIAGNOSIS — C773 Secondary and unspecified malignant neoplasm of axilla and upper limb lymph nodes: Secondary | ICD-10-CM

## 2014-03-14 NOTE — Progress Notes (Signed)
ID: Nicole [name]   DOB: July 05, 1956  MR#: [id number]  JAS#:[id number]  PCP:  GYN:  SURolm Bookbinder, MD OTHER MD: Gery Pray, MD  CHIEF COMPLAINT:  Metastatic Breast Cancer   HISTORY OF PRESENT ILLNESS: From Dr. Rada Hay 07/25/2010 note:  "This woman has not had any significant medical intervention for some time.  Her last mammogram was about seven years ago.  She noted a right breast mass about a year ago and did not seek immediate medical attention for this.  She ultimately, after some time, admitted this to her husband and self-referred herself for intervention.  She had a mammogram on 07/17/2010 with an ultrasound of the right breast.  This showed a lobulated mass upper right outer quadrant measuring at least 6.3 x 7.3 cm and large right axillary lymph nodes were also noted.  There was skin thickening overlying the mass.  On physical exam, this mass was about an 8 cm with skin dimpling noted. Discolored area in the skin over the mass, fullness in the axilla.  Biopsy was recommended, which took place on 07/17/2010.  Pathology showed invasive ductal cancer involving both lymph node and breast.  The HER-2 was not amplified.  ER and PR both positive at 100% and 6% respectively.  Proliferative index was 12% involving both the lymph node and breast mass.  An MRI scan of both breasts was performed on 07/22/2010, essentially which showed a large heterogeneously enhancing mass of the right breast with washout kinetics measuring 6.4 x 4.4 x 5.0 cm.  Numerous satellite nodules were seen throughout the dominant mass.  There were several suspicious nodules in the upper central and upper inner quadrant of the right breast.  There were two enhancing subcentimeter nodules seen in the right pectoralis muscle.  No obvious chest wall nodules were seen; however, there was seen some metastatic adenopathy in the mediastinum as well as hila with a 2.4 x 2.0 cm mediastinal lymph node as well as 1.2 x 3.0 cm  subcarinal lymph node.  There was a right hilar and probable left hilar adenopathy.  Bilateral pulmonary nodules were seen and a right T2 lesion was seen anterior right liver as well."  Her subsequent history is as detailed below.  INTERVAL HISTORY: Nicole [name] returns accompanied by her husband Merry Proud for three-month followup of her metastatic breast carcinoma. The interval history is unremarkable, and overall Nicole [name] is doing very well. She continues on letrozole with good tolerance, and is still on alendronate with no associated side effects.    Nicole [name] is still working from home. She admits that she is still not been exercising as much as she would like. She continues to have some mild fatigue, and occasionally has some insomnia as well. She has some muscle cramping and chronic joint pain, neither of which have worsened. She has some chronic right sinus issues, but denies any recent illnesses and has had no fevers, chills, or night sweats.   REVIEW OF SYSTEMS: Nicole [name] has had no significant hot flashes and denies any problems with vaginal bleeding. She's had no signs of abnormal bleeding but does bruise easily. She continues on anti-coagulation for her history of DVT. She still has her port intact in the left upper chest wall, and has this flushed regularly when she is seen through our Coumadin Clinic.  Is eating and drinking well with no nausea, emesis, or change in bowel or bladder habits. She denies any increased cough, phlegm production, pleurisy, shortness of breath, orthopnea, peripheral swelling, chest pain,  or palpitations. She's had no abnormal headaches, dizziness, or change in vision. She does occasionally feel depressed denies any suicidal ideation. She continues on Effexor at this point does not want to increase any of her medications. She currently denies any new or unusual myalgias, arthralgias, or bony pain.  A detailed review of systems is otherwise stable and noncontributory.   PAST MEDICAL  HISTORY: Past Medical History  Diagnosis Date  . IBD (inflammatory bowel disease)     resolved  . Heart murmur     mild, no cardiologist  . Blood transfusion 2012  . Breast CA     breast ca dx 06/2010, stage 4, right   . Radiation 07/21/12-09/07/12    5940 cGy    PAST SURGICAL HISTORY: Past Surgical History  Procedure Laterality Date  . Portacath placement  2011    left side  . Dilation and curettage of uterus  2004?  [name] Mastectomy  06/02/12    right breast    FAMILY HISTORY Family History  Problem Relation Age of Onset  . Cancer Neg Hx    patient's father died at the age of 50 from heart disease. Patient's mother died at the age of 6 status post CABG. The patient had no brothers, 2 sisters. There is no history of breast or ovarian cancer in the family.  GYNECOLOGIC HISTORY:  (Updated 03/14/2014)  Menarche age 24, she is GX P0. She does not recall when she went through menopause. She never took hormone replacement.  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 Husband Merry Proud is retired from [employer]. They have no children and no pets. They attend a local [employer].   ADVANCED DIRECTIVES: Not in place  HEALTH MAINTENANCE: (updated 03/14/2014)  History  Substance Use Topics  . Smoking status: Never Smoker   . Smokeless tobacco: Never Used  . Alcohol Use: No     Colonoscopy: Not on file  PAP:Not on file  Bone density: 09/27/2013, osteoporosis with a T score of -2.8  Lipid panel: Not on file   No Known Allergies  Current Outpatient Prescriptions  Medication Sig Dispense Refill  . alendronate (FOSAMAX) 70 MG tablet TAKE 1 TABLET ONCE A WEEK  12 tablet  1  . Calcium Carbonate (CALTRATE 600 PO) Take by mouth 2 (two) times daily.       . Cholecalciferol (VITAMIN D PO) Take 2,000 Units by mouth once.       [name] letrozole (FEMARA) 2.5 MG tablet TAKE 1 TABLET BY MOUTH EVERY DAY  30 tablet  3  .  venlafaxine XR (EFFEXOR-XR) 75 MG 24 hr capsule TAKE 1 CAPSULE (75 MG TOTAL) BY MOUTH DAILY.  90 capsule  1  . warfarin (COUMADIN) 2 MG tablet Alternate daily with 1 1/2 (3 mg) tablets and 2 tablets (71m) or as directed by coumadin clinic  60 tablet  2   No current facility-administered medications for this visit.    OBJECTIVE: Middle-aged white woman appears comfortable and is in no acute distress Filed Vitals:   03/14/14 0850  BP: 151/87  Pulse: 92  Temp: 98.1 F (36.7 C)  Resp: 18     Body mass index is 24.67 kg/(m^2).    ECOG FS: 0 Filed Weights   03/14/14 0850  Weight: 143 lb 12.8 oz (65.227 kg)   Physical Exam: HEENT:  Sclerae anicteric.  Oropharynx clear, pink, and moist. Trachea midline, neck supple. No thyromegaly palpated.  NODES:  No cervical or supraclavicular lymphadenopathy palpated.  BREAST EXAM:  Patient is status post right mastectomy with substantial telangiectasias noted status post radiation therapy. Otherwise no skin changes, no suspicious nodularities, no evidence of local recurrence. Left breast is unremarkable. Axillae are benign bilaterally, no palpable lymphadenopathy. LUNGS:  Clear to auscultation bilaterally with good excursion.  No wheezes or rhonchi HEART:  Regular rate and rhythm. No murmur appreciated ABDOMEN:  Soft, nontender. No hepatomegaly palpated.  Positive bowel sounds.  MSK:  No focal spinal tenderness to palpation. Good range of motion bilaterally in the upper extremities. EXTREMITIES:  No peripheral edema.  No lymphedema noted in the right upper extremity. SKIN:  Benign with no visible rashes or skin lesions. No excessive ecchymoses or petechiae. No pallor. Port is intact in the left upper chest wall with no signs of erythema, edema, or infection/cellulitis. NEURO:  Nonfocal. Well oriented.  Appropriate affect.    LAB RESULTS: Lab Results  Component Value Date   WBC 3.5* 03/07/2014   NEUTROABS 2.2 03/07/2014   HGB 14.1 03/07/2014   HCT 40.5  03/07/2014   MCV 83.2 03/07/2014   PLT 94* 03/07/2014      Chemistry      Component Value Date/Time   NA 144 03/07/2014 0851   NA 140 08/21/2012 1307   K 3.7 03/07/2014 0851   K 3.9 08/21/2012 1307   CL 109* 04/20/2013 0859   CL 105 08/21/2012 1307   CO2 23 03/07/2014 0851   CO2 24 08/21/2012 1307   BUN 12.8 03/07/2014 0851   BUN 15 08/21/2012 1307   CREATININE 1.1 03/07/2014 0851   CREATININE 0.93 08/21/2012 1307      Component Value Date/Time   CALCIUM 9.4 03/07/2014 0851   CALCIUM 9.2 08/21/2012 1307   ALKPHOS 61 03/07/2014 0851   ALKPHOS 59 08/21/2012 1307   AST 20 03/07/2014 0851   AST 24 08/21/2012 1307   ALT 23 03/07/2014 0851   ALT 30 08/21/2012 1307   BILITOT 0.75 03/07/2014 0851   BILITOT 0.6 08/21/2012 1307        STUDIES: Ct Chest W Contrast  12/09/2013   CLINICAL DATA:  Breast cancer followup  EXAM: CT CHEST WITH CONTRAST  TECHNIQUE: Multidetector CT imaging of the chest was performed during intravenous contrast administration.  CONTRAST:  23m OMNIPAQUE IOHEXOL 300 MG/ML  SOLN  COMPARISON:  None  FINDINGS: There is no pleural effusion. Subpleural radiation changes identified within the posterior lateral right upper lobe. Patchy areas of ground-glass attenuation are again identified within the right lung, image 32/series 5, image 35/series 5, and image 36/series 5. This is stable compared with the previous exam. No solid appearing nodules are identified.  The trachea is patent and is midline. The heart size is normal. No pericardial effusion identified. There is no enlarged mediastinal or hilar lymph nodes identified. No axillary or supraclavicular adenopathy. There is a left chest wall port a catheter with tip in the cavoatrial junction. A right mastectomy has been performed.  Incidental imaging through the upper abdomen shows a low attenuation structure in the posterior right hepatic lobe measuring 1.7 cm, image 60/series 2. The left adrenal gland is normal. Normal appearance of the pancreas.   IMPRESSION: 1. Stable CT of the chest. 2. Previously described patchy areas of ground-glass attenuation in the right lung are unchanged from previous exam.   Electronically Signed   By: TKerby MoorsM.D.   On: 12/09/2013 12:21     ASSESSMENT: 58y.o.  [place] woman presenting June 2011 with stage IV breast cancer involving the right breast, right axilla, mediastinal lymph nodes, and both lungs, but not the brain, liver, or bones  (1) positive right breast and right axillary lymph node biopsies 07/17/2010 of a clinical T4 N2 M1 invasive ductal carcinoma, grade 1, estrogen receptor 100% positive, progesterone receptor 6% positive, with an MIB-1 of 12%, and no HER-2 amplification.  (2) participated in Phase II tessetaxel study, receiving 2 cycles complicated by thrombocytopenia, anemia requiring transfusion, transaminase elevation, and afebrile neutropenia. Off-study as of September 2011. Chest CT scan September 2011 did show evidence of response.  (3) on letrozole as of October 2011, with continuing response and good tolerance   (4) 06/02/2012 underwent right mastectomy and axillary lymph node sampling (3 lymph nodes removed, all with viable cancer as well as evidence of treatment effect) for a ypT2 ypN1-2 invasive ductal carcinoma, grade 1, 98% estrogen receptor positive, progesterone receptor negative, with no HER-2 amplification  (5) left jugular vein DVT documented 06/16/2012; left sided Port-A-Cath in place,  On Coumadin.  (6) osteoporosis, on alendronate (most recent bone density in September 2014)    PLAN:  Nicole [name]  appears to be doing very well with regards to her breast cancer, with no clinical evidence of disease progression at this time. She continues to tolerate the letrozole well and will continue as before. In fact, I am making no changes to her current regimen. She will continue on the alendronate as well, and will be followed regularly, approximately every 6 weeks, by our Coumadin  Clinic.  Nicole [name] will be due for her repeat chest CT in 3 months, June 2015, and we'll see Dr. Jana Hakim for labs and physical exam soon thereafter to review those results.. We discussed the fact that if her chest CT still shows stability, she may very well consider having her port removed at that time. If that is the case, she would likely be able to discontinue Coumadin after 3 additional months.   Nicole [name]'s next mammogram will be due in September 2015. Her bone density is up-to-date. I did encourage her to increase her exercise, specifically walking on a daily basis. We also discussed the possibility of increasing her Effexor or perhaps adding an occasional dose of lorazepam, but she would like to "wait it out" while longer before adding an additional medication which is certainly reasonable. We'll reassess when we see her in 3 months.  All the above was reviewed with the patient and her husband, both of whom voice understanding and agreement with this plan. They will call any changes or problems prior to her appointment here in June.  Nicole [name]    03/14/2014

## 2014-03-14 NOTE — Telephone Encounter (Signed)
, °

## 2014-03-26 ENCOUNTER — Other Ambulatory Visit: Payer: Self-pay | Admitting: Oncology

## 2014-04-18 ENCOUNTER — Other Ambulatory Visit (HOSPITAL_BASED_OUTPATIENT_CLINIC_OR_DEPARTMENT_OTHER): Payer: BC Managed Care – PPO

## 2014-04-18 ENCOUNTER — Ambulatory Visit (HOSPITAL_BASED_OUTPATIENT_CLINIC_OR_DEPARTMENT_OTHER): Payer: BC Managed Care – PPO | Admitting: Pharmacist

## 2014-04-18 ENCOUNTER — Other Ambulatory Visit: Payer: Self-pay

## 2014-04-18 ENCOUNTER — Ambulatory Visit (HOSPITAL_BASED_OUTPATIENT_CLINIC_OR_DEPARTMENT_OTHER): Payer: BC Managed Care – PPO

## 2014-04-18 VITALS — BP 141/81 | HR 89 | Temp 97.1°F | Resp 16

## 2014-04-18 DIAGNOSIS — C7800 Secondary malignant neoplasm of unspecified lung: Secondary | ICD-10-CM

## 2014-04-18 DIAGNOSIS — C773 Secondary and unspecified malignant neoplasm of axilla and upper limb lymph nodes: Secondary | ICD-10-CM

## 2014-04-18 DIAGNOSIS — I82409 Acute embolism and thrombosis of unspecified deep veins of unspecified lower extremity: Secondary | ICD-10-CM

## 2014-04-18 DIAGNOSIS — Z95828 Presence of other vascular implants and grafts: Secondary | ICD-10-CM

## 2014-04-18 DIAGNOSIS — C50419 Malignant neoplasm of upper-outer quadrant of unspecified female breast: Secondary | ICD-10-CM

## 2014-04-18 LAB — POCT INR: INR: 3

## 2014-04-18 LAB — PROTIME-INR
INR: 3 (ref 2.00–3.50)
Protime: 36 Seconds — ABNORMAL HIGH (ref 10.6–13.4)

## 2014-04-18 MED ORDER — HEPARIN SOD (PORK) LOCK FLUSH 100 UNIT/ML IV SOLN
500.0000 [IU] | Freq: Once | INTRAVENOUS | Status: AC
  Administered 2014-04-18: 500 [IU] via INTRAVENOUS
  Filled 2014-04-18: qty 5

## 2014-04-18 MED ORDER — SODIUM CHLORIDE 0.9 % IJ SOLN
10.0000 mL | INTRAMUSCULAR | Status: DC | PRN
  Administered 2014-04-18: 10 mL via INTRAVENOUS
  Filled 2014-04-18: qty 10

## 2014-04-18 NOTE — Progress Notes (Signed)
Pt seen in clinic today prior to her flush appmt INR=3.0 alternating with 4mg  and 3 mg daily No refills needed She did miss a dose last week No other changes to report She has a MD appmt on 06/07/14 No lab appmt for that day is scheduled but will request those get scheduled as per last note with Micah Flesher they are needed. She also has a CT on June 03, 2014 No changes were made. Continue current dose; 3mg  alternating with 4mg . Repeat INR on 06/07/14 at 815am for lab, 8:30 am for coumadin clinic and 9:00 for MD.

## 2014-04-18 NOTE — Patient Instructions (Signed)
Continue current dose; 3mg  alternating with 4mg . Repeat INR on 06/07/14 at 815am for lab, 8:30 am for coumadin clinic and 9:00 for MD.

## 2014-04-18 NOTE — Patient Instructions (Signed)

## 2014-04-25 ENCOUNTER — Other Ambulatory Visit: Payer: Self-pay | Admitting: Oncology

## 2014-04-25 DIAGNOSIS — I82409 Acute embolism and thrombosis of unspecified deep veins of unspecified lower extremity: Secondary | ICD-10-CM

## 2014-06-03 ENCOUNTER — Ambulatory Visit (HOSPITAL_COMMUNITY)
Admission: RE | Admit: 2014-06-03 | Discharge: 2014-06-03 | Disposition: A | Discharge status: Home / Self Care | Payer: BC Managed Care – PPO | Source: Ambulatory Visit | Attending: Physician Assistant | Admitting: Physician Assistant

## 2014-06-03 DIAGNOSIS — C50411 Malignant neoplasm of upper-outer quadrant of right female breast: Secondary | ICD-10-CM

## 2014-06-03 DIAGNOSIS — C50919 Malignant neoplasm of unspecified site of unspecified female breast: Secondary | ICD-10-CM

## 2014-06-03 DIAGNOSIS — C50419 Malignant neoplasm of upper-outer quadrant of unspecified female breast: Secondary | ICD-10-CM | POA: Insufficient documentation

## 2014-06-03 DIAGNOSIS — C7800 Secondary malignant neoplasm of unspecified lung: Secondary | ICD-10-CM | POA: Insufficient documentation

## 2014-06-03 MED ORDER — IOHEXOL 300 MG/ML  SOLN
80.0000 mL | Freq: Once | INTRAMUSCULAR | Status: AC | PRN
Start: 2014-06-03 — End: 2014-06-03
  Administered 2014-06-03: 80 mL via INTRAVENOUS

## 2014-06-07 ENCOUNTER — Other Ambulatory Visit (HOSPITAL_BASED_OUTPATIENT_CLINIC_OR_DEPARTMENT_OTHER): Payer: BC Managed Care – PPO

## 2014-06-07 ENCOUNTER — Telehealth: Payer: Self-pay | Admitting: Oncology

## 2014-06-07 ENCOUNTER — Ambulatory Visit (HOSPITAL_BASED_OUTPATIENT_CLINIC_OR_DEPARTMENT_OTHER): Payer: BC Managed Care – PPO | Admitting: Oncology

## 2014-06-07 ENCOUNTER — Ambulatory Visit (HOSPITAL_BASED_OUTPATIENT_CLINIC_OR_DEPARTMENT_OTHER): Payer: Self-pay | Admitting: Pharmacist

## 2014-06-07 VITALS — BP 154/85 | HR 91 | Temp 98.3°F | Resp 18 | Ht 64.0 in | Wt 144.7 lb

## 2014-06-07 DIAGNOSIS — C50411 Malignant neoplasm of upper-outer quadrant of right female breast: Secondary | ICD-10-CM

## 2014-06-07 DIAGNOSIS — I82C19 Acute embolism and thrombosis of unspecified internal jugular vein: Secondary | ICD-10-CM

## 2014-06-07 DIAGNOSIS — M810 Age-related osteoporosis without current pathological fracture: Secondary | ICD-10-CM

## 2014-06-07 DIAGNOSIS — Z86718 Personal history of other venous thrombosis and embolism: Secondary | ICD-10-CM

## 2014-06-07 DIAGNOSIS — I890 Lymphedema, not elsewhere classified: Secondary | ICD-10-CM

## 2014-06-07 DIAGNOSIS — C773 Secondary and unspecified malignant neoplasm of axilla and upper limb lymph nodes: Secondary | ICD-10-CM

## 2014-06-07 DIAGNOSIS — C7800 Secondary malignant neoplasm of unspecified lung: Secondary | ICD-10-CM

## 2014-06-07 DIAGNOSIS — C50919 Malignant neoplasm of unspecified site of unspecified female breast: Secondary | ICD-10-CM

## 2014-06-07 DIAGNOSIS — I82409 Acute embolism and thrombosis of unspecified deep veins of unspecified lower extremity: Secondary | ICD-10-CM

## 2014-06-07 DIAGNOSIS — C778 Secondary and unspecified malignant neoplasm of lymph nodes of multiple regions: Secondary | ICD-10-CM

## 2014-06-07 DIAGNOSIS — Z170 Estrogen receptor positive status [ER+]: Secondary | ICD-10-CM

## 2014-06-07 DIAGNOSIS — Z9010 Acquired absence of unspecified breast and nipple: Secondary | ICD-10-CM

## 2014-06-07 DIAGNOSIS — C50419 Malignant neoplasm of upper-outer quadrant of unspecified female breast: Secondary | ICD-10-CM

## 2014-06-07 LAB — COMPREHENSIVE METABOLIC PANEL (CC13)
ALT: 24 U/L (ref 0–55)
AST: 21 U/L (ref 5–34)
Albumin: 3.6 g/dL (ref 3.5–5.0)
Alkaline Phosphatase: 65 U/L (ref 40–150)
Anion Gap: 10 meq/L (ref 3–11)
BUN: 11.8 mg/dL (ref 7.0–26.0)
CO2: 21 meq/L — ABNORMAL LOW (ref 22–29)
Calcium: 8.7 mg/dL (ref 8.4–10.4)
Chloride: 111 meq/L — ABNORMAL HIGH (ref 98–109)
Creatinine: 1 mg/dL (ref 0.6–1.1)
Glucose: 96 mg/dL (ref 70–140)
Potassium: 3.7 meq/L (ref 3.5–5.1)
Sodium: 142 meq/L (ref 136–145)
Total Bilirubin: 0.44 mg/dL (ref 0.20–1.20)
Total Protein: 6.5 g/dL (ref 6.4–8.3)

## 2014-06-07 LAB — CBC WITH DIFFERENTIAL/PLATELET
BASO%: 0.9 % (ref 0.0–2.0)
BASOS ABS: 0 10*3/uL (ref 0.0–0.1)
EOS ABS: 0 10*3/uL (ref 0.0–0.5)
EOS%: 0.1 % (ref 0.0–7.0)
HCT: 37.3 % (ref 34.8–46.6)
HEMOGLOBIN: 12.3 g/dL (ref 11.6–15.9)
LYMPH%: 21 % (ref 14.0–49.7)
MCH: 28 pg (ref 25.1–34.0)
MCHC: 33.1 g/dL (ref 31.5–36.0)
MCV: 84.5 fL (ref 79.5–101.0)
MONO#: 0.2 10*3/uL (ref 0.1–0.9)
MONO%: 8 % (ref 0.0–14.0)
NEUT%: 70 % (ref 38.4–76.8)
NEUTROS ABS: 2.1 10*3/uL (ref 1.5–6.5)
Platelets: 90 10*3/uL — ABNORMAL LOW (ref 145–400)
RBC: 4.41 10*6/uL (ref 3.70–5.45)
RDW: 15.6 % — ABNORMAL HIGH (ref 11.2–14.5)
WBC: 3 10*3/uL — ABNORMAL LOW (ref 3.9–10.3)
lymph#: 0.6 10*3/uL — ABNORMAL LOW (ref 0.9–3.3)

## 2014-06-07 LAB — POCT INR: INR: 3.1

## 2014-06-07 LAB — PROTIME-INR
INR: 3.1 (ref 2.00–3.50)
Protime: 37.2 Seconds — ABNORMAL HIGH (ref 10.6–13.4)

## 2014-06-07 NOTE — Progress Notes (Signed)
Pt seen prior to f/u with MD INR=3.1 alternating with 3mg  and 4 mg daily She has been on this dose since we have been following her. Will continue same dose She is due back for a flush in 6 weeks. Scheduled next appmt 07/20/14 at 9am lab and 9:15 CC Instructed pt to request flush after CC visit when she leaves visit today.

## 2014-06-07 NOTE — Telephone Encounter (Signed)
, °

## 2014-06-07 NOTE — Progress Notes (Signed)
ID: Nicole [name]   DOB: December 02, 1956  MR#: [id number]  TIR#:[id number]  PCP:  GYN:  SURolm Bookbinder, MD OTHER MD: Gery Pray, MD  CHIEF COMPLAINT:  Metastatic Breast Cancer   HISTORY OF PRESENT ILLNESS: From Dr. Rada Hay 07/25/2010 note:  "This woman has not had any significant medical intervention for some time.  Her last mammogram was about seven years ago.  She noted a right breast mass about a year ago and did not seek immediate medical attention for this.  She ultimately, after some time, admitted this to her husband and self-referred herself for intervention.  She had a mammogram on 07/17/2010 with an ultrasound of the right breast.  This showed a lobulated mass upper right outer quadrant measuring at least 6.3 x 7.3 cm and large right axillary lymph nodes were also noted.  There was skin thickening overlying the mass.  On physical exam, this mass was about an 8 cm with skin dimpling noted. Discolored area in the skin over the mass, fullness in the axilla.  Biopsy was recommended, which took place on 07/17/2010.  Pathology showed invasive ductal cancer involving both lymph node and breast.  The HER-2 was not amplified.  ER and PR both positive at 100% and 6% respectively.  Proliferative index was 12% involving both the lymph node and breast mass.  An MRI scan of both breasts was performed on 07/22/2010, essentially which showed a large heterogeneously enhancing mass of the right breast with washout kinetics measuring 6.4 x 4.4 x 5.0 cm.  Numerous satellite nodules were seen throughout the dominant mass.  There were several suspicious nodules in the upper central and upper inner quadrant of the right breast.  There were two enhancing subcentimeter nodules seen in the right pectoralis muscle.  No obvious chest wall nodules were seen; however, there was seen some metastatic adenopathy in the mediastinum as well as hila with a 2.4 x 2.0 cm mediastinal lymph node as well as 1.2 x 3.0 cm  subcarinal lymph node.  There was a right hilar and probable left hilar adenopathy.  Bilateral pulmonary nodules were seen and a right T2 lesion was seen anterior right liver as well."  Her subsequent history is as detailed below.  INTERVAL HISTORY: Nicole [name] returns accompanied by her husband Merry Proud for three-month followup of her metastatic breast carcinoma. The interval history is unremarkable, and overall Nicole [name] is doing very well. She continues on letrozole with good tolerance, and is still on alendronate with no associated side effects.    Rayyan is still working from home. She admits that she is still not been exercising as much as she would like. She continues to have some mild fatigue, and occasionally has some insomnia as well. She has some muscle cramping and chronic joint pain, neither of which have worsened. She has some chronic right sinus issues, but denies any recent illnesses and has had no fevers, chills, or night sweats.   REVIEW OF SYSTEMS: Nicole [name] has had no significant hot flashes and denies any problems with vaginal bleeding. She's had no signs of abnormal bleeding but does bruise easily. She continues on anti-coagulation for her history of DVT. She still has her port intact in the left upper chest wall, and has this flushed regularly when she is seen through our Coumadin [facility].  Is eating and drinking well with no nausea, emesis, or change in bowel or bladder habits. She denies any increased cough, phlegm production, pleurisy, shortness of breath, orthopnea, peripheral swelling, chest pain,  or palpitations. She's had no abnormal headaches, dizziness, or change in vision. She does occasionally feel depressed denies any suicidal ideation. She continues on Effexor at this point does not want to increase any of her medications. She currently denies any new or unusual myalgias, arthralgias, or bony pain.  A detailed review of systems is otherwise stable and noncontributory.   PAST MEDICAL  HISTORY: Past Medical History  Diagnosis Date  . IBD (inflammatory bowel disease)     resolved  . Heart murmur     mild, no cardiologist  . Blood transfusion 2012  . Breast CA     breast ca dx 06/2010, stage 4, right   . Radiation 07/21/12-09/07/12    5940 cGy    PAST SURGICAL HISTORY: Past Surgical History  Procedure Laterality Date  . Portacath placement  2011    left side  . Dilation and curettage of uterus  2004?  [name] Mastectomy  06/02/12    right breast    FAMILY HISTORY Family History  Problem Relation Age of Onset  . Cancer Neg Hx    patient's father died at the age of 37 from heart disease. Patient's mother died at the age of 24 status post CABG. The patient had no brothers, 2 sisters. There is no history of breast or ovarian cancer in the family.  GYNECOLOGIC HISTORY:  (Updated 03/14/2014)  Menarche age 3, she is GX P0. She does not recall when she went through menopause. She never took hormone replacement.  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 Husband Merry Proud is retired from [employer]. They have no children and no pets. They attend a local [employer].   ADVANCED DIRECTIVES: Not in place  HEALTH MAINTENANCE: (updated 03/14/2014)  History  Substance Use Topics  . Smoking status: Never Smoker   . Smokeless tobacco: Never Used  . Alcohol Use: No     Colonoscopy: Not on file  PAP:Not on file  Bone density: 09/27/2013, osteoporosis with a T score of -2.8  Lipid panel: Not on file   No Known Allergies  Current Outpatient Prescriptions  Medication Sig Dispense Refill  . alendronate (FOSAMAX) 70 MG tablet TAKE 1 TABLET ONCE A WEEK  12 tablet  1  . Calcium Carbonate (CALTRATE 600 PO) Take by mouth 2 (two) times daily.       . Cholecalciferol (VITAMIN D PO) Take 2,000 Units by mouth once.       [name] letrozole (FEMARA) 2.5 MG tablet TAKE 1 TABLET BY MOUTH EVERY DAY  30 tablet  3  .  venlafaxine XR (EFFEXOR-XR) 75 MG 24 hr capsule TAKE 1 CAPSULE (75 MG TOTAL) BY MOUTH DAILY.  90 capsule  1  . warfarin (COUMADIN) 2 MG tablet ALTERNATE DAILY WITH 1 AND 1/2 (3 MG) TABLETS AND 2 TABLETS (4MG) OR AS DIRECTED BY COUMADIN CLINIC  60 tablet  2   No current facility-administered medications for this visit.    OBJECTIVE: Middle-aged white woman appears comfortable and is in no acute distress Filed Vitals:   06/07/14 0909  BP: 154/85  Pulse: 91  Temp: 98.3 F (36.8 C)  Resp: 18     Body mass index is 24.83 kg/(m^2).    ECOG FS: 0 Filed Weights   06/07/14 0909  Weight: 144 lb 11.2 oz (65.635 kg)   Physical Exam: HEENT:  Sclerae anicteric.  Oropharynx clear, pink, and moist. Trachea midline, neck supple. No thyromegaly  palpated.  NODES:  No cervical or supraclavicular lymphadenopathy palpated.  BREAST EXAM:  Patient is status post right mastectomy with substantial telangiectasias noted status post radiation therapy. Otherwise no skin changes, no suspicious nodularities, no evidence of local recurrence. Left breast is unremarkable. Axillae are benign bilaterally, no palpable lymphadenopathy. LUNGS:  Clear to auscultation bilaterally with good excursion.  No wheezes or rhonchi HEART:  Regular rate and rhythm. No murmur appreciated ABDOMEN:  Soft, nontender. No hepatomegaly palpated.  Positive bowel sounds.  MSK:  No focal spinal tenderness to palpation. Good range of motion bilaterally in the upper extremities. EXTREMITIES:  No peripheral edema.  No lymphedema noted in the right upper extremity. SKIN:  Benign with no visible rashes or skin lesions. No excessive ecchymoses or petechiae. No pallor. Port is intact in the left upper chest wall with no signs of erythema, edema, or infection/cellulitis. NEURO:  Nonfocal. Well oriented.  Appropriate affect.    LAB RESULTS: Lab Results  Component Value Date   WBC 3.0* 06/07/2014   NEUTROABS 2.1 06/07/2014   HGB 12.3 06/07/2014   HCT  37.3 06/07/2014   MCV 84.5 06/07/2014   PLT 90* 06/07/2014      Chemistry      Component Value Date/Time   NA 142 06/07/2014 0826   NA 140 08/21/2012 1307   K 3.7 06/07/2014 0826   K 3.9 08/21/2012 1307   CL 109* 04/20/2013 0859   CL 105 08/21/2012 1307   CO2 21* 06/07/2014 0826   CO2 24 08/21/2012 1307   BUN 11.8 06/07/2014 0826   BUN 15 08/21/2012 1307   CREATININE 1.0 06/07/2014 0826   CREATININE 0.93 08/21/2012 1307      Component Value Date/Time   CALCIUM 8.7 06/07/2014 0826   CALCIUM 9.2 08/21/2012 1307   ALKPHOS 65 06/07/2014 0826   ALKPHOS 59 08/21/2012 1307   AST 21 06/07/2014 0826   AST 24 08/21/2012 1307   ALT 24 06/07/2014 0826   ALT 30 08/21/2012 1307   BILITOT 0.44 06/07/2014 0826   BILITOT 0.6 08/21/2012 1307        STUDIES: Ct Chest W Contrast  06/03/2014   CLINICAL DATA:  Breast cancer  EXAM: CT CHEST WITH CONTRAST  TECHNIQUE: Multidetector CT imaging of the chest was performed during intravenous contrast administration.  CONTRAST:  55m OMNIPAQUE IOHEXOL 300 MG/ML  SOLN  COMPARISON:  12/09/2013  FINDINGS: Soft tissue / Mediastinum: The tip of the left-sided Port-A-Cath is at the junction of the SVC and RA. Surgical clips are seen in the right axilla without lymphadenopathy. No left axillary lymphadenopathy. No internal mammary or mediastinal lymphadenopathy. No hilar lymphadenopathy. The heart size normal. No pericardial effusion.  Lungs / Pleura: Stable appearance of right posterior apical pleural-parenchymal scarring. Subtle anterior right middle lobe subpleural interstitial coarsening is stable in may reflect previous radiation. Previously described areas of patchy ground-glass attenuation in the right lower lobe (series 5, images 28, 31, and 32) are stable since the prior study and also unchanged when comparing back to 12/02/2012. Scattered tiny nodules in the lingula are also stable since 12/02/2012 likely reflect postinfectious or postinflammatory scarring. No new or progressive findings in  either lung.  Bones: Bone windows reveal no worrisome lytic or sclerotic osseous lesions.  Upper Abdomen: Cyst in the posterior right liver is unchanged. The prominent venous collateralization in the left upper quadrant, potentially representing spina renal shunt, is unchanged. The portal vein and splenic vein are patent.  IMPRESSION: Stable exam.  No  new or progressive findings in the chest.   Electronically Signed   By: Misty Stanley M.D.   On: 06/03/2014 09:09     ASSESSMENT: 58 y.o. [place] woman presenting June 2011 with stage IV breast cancer involving the right breast, right axilla, mediastinal lymph nodes, and both lungs, but not the brain, liver, or bones  (1) positive right breast and right axillary lymph node biopsies 07/17/2010 of a clinical T4 N2 M1 invasive ductal carcinoma, grade 1, estrogen receptor 100% positive, progesterone receptor 6% positive, with an MIB-1 of 12%, and no HER-2 amplification.  (2) participated in Phase II tessetaxel study, receiving 2 cycles complicated by thrombocytopenia, anemia requiring transfusion, transaminase elevation, and afebrile neutropenia. Off-study as of September 2011. Chest CT scan September 2011 did show evidence of response.  (3) on letrozole as of October 2011, with continuing response and good tolerance   (4) 06/02/2012 underwent right mastectomy and axillary lymph node sampling (3 lymph nodes removed, all with viable cancer as well as evidence of treatment effect) for a ypT2 ypN1-2 invasive ductal carcinoma, grade 1, 98% estrogen receptor positive, progesterone receptor negative, with no HER-2 amplification  (5) left jugular vein DVT documented 06/16/2012; left sided Port-A-Cath in place,  On Coumadin.  (6) osteoporosis, on alendronate (most recent bone density in September 2014)    PLAN:  Christinna is doing terrific, with no evidence of disease progression on her recent CT scan.  At this point I think we should try to get rid of her  port.I  will send a note to Dr. Donne Hazel to schedule that, likely after the July 4 weekend. We will simply stop the Coumadin 5 days before the procedure, with no Lovenox bridging, but I will start Lovenox the day after the procedurec and continue that for a few days until her Coumadin is again therapeutic. We will then continue Coumadin an additional 3 months  Today in addition to all this we discussed Palbociclib. The data is very favorable and stage IV patients on letrozole, acid essentially doubles the time to progression. I would be reluctant to use it in [place] while she is on Coumadin, but we can consider it once she is off that drug. I gave her a copy of the side effects, toxicities and complications for her to start getting acquainted with it.  She will see me again in 3 months. We will only do lab work and a chest x-ray before that visit. She is interested in going off the venlafaxine at some point but I would rather she is on a stable dose of Palbociclib, if we decide to start that, before making any other changes in her medications. Her next mammogram will be around the time of her next visit with me. The patient has a good understanding of the overall plan. She agrees with it. She knows the goal of treatment in her case is cure. She will call with any problems that may develop before her next visit here.     bGustav C Nashae Maudlin    06/07/2014

## 2014-06-07 NOTE — Patient Instructions (Signed)
Continue current dose; 3mg  alternating with 4mg . Repeat INR on 07/20/14 at 9am for lab, 9:15 am for coumadin clinic and 9:30 for flush.

## 2014-06-09 ENCOUNTER — Other Ambulatory Visit: Payer: Self-pay | Admitting: Physician Assistant

## 2014-06-09 DIAGNOSIS — C50419 Malignant neoplasm of upper-outer quadrant of unspecified female breast: Secondary | ICD-10-CM

## 2014-06-12 NOTE — Progress Notes (Signed)
ID: Nicole [name]   DOB: 08/28/1956  MR#: [id number]  CSN#:[id number]  PCP:  GYN:  SU: Matthew Wakefield, MD OTHER MD: James Kinard, MD  CHIEF COMPLAINT:  Metastatic Breast Cancer CURRENT TREATMENT: anti-estrogen therapy  HISTORY OF PRESENT ILLNESS: From Dr. Peter Rubins 07/25/2010 note:  "This woman has not had any significant medical intervention for some time.  Her last mammogram was about seven years ago.  She noted a right breast mass about a year ago and did not seek immediate medical attention for this.  She ultimately, after some time, admitted this to her husband and self-referred herself for intervention.  She had a mammogram on 07/17/2010 with an ultrasound of the right breast.  This showed a lobulated mass upper right outer quadrant measuring at least 6.3 x 7.3 cm and large right axillary lymph nodes were also noted.  There was skin thickening overlying the mass.  On physical exam, this mass was about an 8 cm with skin dimpling noted. Discolored area in the skin over the mass, fullness in the axilla.  Biopsy was recommended, which took place on 07/17/2010.  Pathology showed invasive ductal cancer involving both lymph node and breast.  The HER-2 was not amplified.  ER and PR both positive at 100% and 6% respectively.  Proliferative index was 12% involving both the lymph node and breast mass.  An MRI scan of both breasts was performed on 07/22/2010, essentially which showed a large heterogeneously enhancing mass of the right breast with washout kinetics measuring 6.4 x 4.4 x 5.0 cm.  Numerous satellite nodules were seen throughout the dominant mass.  There were several suspicious nodules in the upper central and upper inner quadrant of the right breast.  There were two enhancing subcentimeter nodules seen in the right pectoralis muscle.  No obvious chest wall nodules were seen; however, there was seen some metastatic adenopathy in the mediastinum as well as hila with a 2.4 x 2.0 cm mediastinal  lymph node as well as 1.2 x 3.0 cm subcarinal lymph node.  There was a right hilar and probable left hilar adenopathy.  Bilateral pulmonary nodules were seen and a right T2 lesion was seen anterior right liver as well."  Her subsequent history is as detailed below.  INTERVAL HISTORY: Razan returns today for followup of her stage IV breast cancer accompanied by her husband [name]. She just had restaging studies earlier this month which show stable disease. She continues to tolerate letrozole and alendronate without unusual side effects. She is also on Coumadin, with no unusual bleeding.  REVIEW OF SYSTEMS: Nicole [name] complains of insomnia, and occasionally her calves cramp up, especially at night. She is eating more healthyly and trying to do a "organic". Of course she bruises easily. She is having "more hot flashes than I care to", but this does not get in the way of her work or daily activities. A detailed review of systems today was otherwise noncontributory   PAST MEDICAL HISTORY: Past Medical History  Diagnosis Date  . IBD (inflammatory bowel disease)     resolved  . Heart murmur     mild, no cardiologist  . Blood transfusion 2012  . Breast CA     breast ca dx 06/2010, stage 4, right   . Radiation 07/21/12-09/07/12    5940 cGy    PAST SURGICAL HISTORY: Past Surgical History  Procedure Laterality Date  . Portacath placement  2011    left side  . Dilation and curettage of uterus  2004?  .   Mastectomy  06/02/12    right breast    FAMILY HISTORY Family History  Problem Relation Age of Onset  . Cancer Neg Hx    patient's father died at the age of 69 from heart disease. Patient's mother died at the age of 72 status post CABG. The patient had no brothers, 2 sisters. There is no history of breast or ovarian cancer in the family.  GYNECOLOGIC HISTORY:  (Updated 03/14/2014)  Menarche age 9, she is GX P0. She does not recall when she went through menopause. She never took hormone  replacement.  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 Husband [name] is retired from [employer]. They have no children and no pets. They attend a local [employer].   ADVANCED DIRECTIVES: Not in place  HEALTH MAINTENANCE: (updated 03/14/2014)  History  Substance Use Topics  . Smoking status: Never Smoker   . Smokeless tobacco: Never Used  . Alcohol Use: No     Colonoscopy: Not on file  PAP:Not on file  Bone density: 09/27/2013, osteoporosis with a T score of -2.8  Lipid panel: Not on file   No Known Allergies  Current Outpatient Prescriptions  Medication Sig Dispense Refill  . alendronate (FOSAMAX) 70 MG tablet TAKE 1 TABLET ONCE A WEEK  12 tablet  1  . Calcium Carbonate (CALTRATE 600 PO) Take by mouth 2 (two) times daily.       . Cholecalciferol (VITAMIN D PO) Take 2,000 Units by mouth once.       . letrozole (FEMARA) 2.5 MG tablet TAKE 1 TABLET (2.5 MG TOTAL) BY MOUTH DAILY.  90 tablet  1  . venlafaxine XR (EFFEXOR-XR) 75 MG 24 hr capsule TAKE 1 CAPSULE (75 MG TOTAL) BY MOUTH DAILY.  90 capsule  1  . warfarin (COUMADIN) 2 MG tablet ALTERNATE DAILY WITH 1 AND 1/2 (3 MG) TABLETS AND 2 TABLETS (4MG) OR AS DIRECTED BY COUMADIN CLINIC  60 tablet  2   No current facility-administered medications for this visit.    OBJECTIVE: Middle-aged white woman appears comfortable and is in no acute distress Filed Vitals:   06/07/14 0909  BP: 154/85  Pulse: 91  Temp: 98.3 F (36.8 C)  Resp: 18     Body mass index is 24.83 kg/(m^2).    ECOG FS: 1 Filed Weights   06/07/14 0909  Weight: 144 lb 11.2 oz (65.635 kg)   Sclerae unicteric, pupils equal and reactive Oropharynx clear and moist No cervical or supraclavicular adenopathy Lungs no rales or rhonchi Heart regular rate and rhythm Abd soft, nontender, positive bowel sounds MSK no focal spinal tenderness, no upper extremity lymphedema Neuro: nonfocal,  well oriented, positive affect Breasts: The right breast is status post mastectomy and postmastectomy radiation. Aside from the expected changes, including some telangiectasias, there is no evidence of local recurrence. The right axilla is benign. Left breast is unremarkable.  LAB RESULTS: Lab Results  Component Value Date   WBC 3.0* 06/07/2014   NEUTROABS 2.1 06/07/2014   HGB 12.3 06/07/2014   HCT 37.3 06/07/2014   MCV 84.5 06/07/2014   PLT 90* 06/07/2014      Chemistry      Component Value Date/Time   NA 142 06/07/2014 0826   NA 140 08/21/2012 1307   K 3.7 06/07/2014 0826   K 3.9 08/21/2012 1307   CL 109* 04/20/2013 0859   CL 105 08/21/2012 1307   CO2 21*   06/07/2014 0826   CO2 24 08/21/2012 1307   BUN 11.8 06/07/2014 0826   BUN 15 08/21/2012 1307   CREATININE 1.0 06/07/2014 0826   CREATININE 0.93 08/21/2012 1307      Component Value Date/Time   CALCIUM 8.7 06/07/2014 0826   CALCIUM 9.2 08/21/2012 1307   ALKPHOS 65 06/07/2014 0826   ALKPHOS 59 08/21/2012 1307   AST 21 06/07/2014 0826   AST 24 08/21/2012 1307   ALT 24 06/07/2014 0826   ALT 30 08/21/2012 1307   BILITOT 0.44 06/07/2014 0826   BILITOT 0.6 08/21/2012 1307        STUDIES: Ct Chest W Contrast  06/03/2014   CLINICAL DATA:  Breast cancer  EXAM: CT CHEST WITH CONTRAST  TECHNIQUE: Multidetector CT imaging of the chest was performed during intravenous contrast administration.  CONTRAST:  80mL OMNIPAQUE IOHEXOL 300 MG/ML  SOLN  COMPARISON:  12/09/2013  FINDINGS: Soft tissue / Mediastinum: The tip of the left-sided Port-A-Cath is at the junction of the SVC and RA. Surgical clips are seen in the right axilla without lymphadenopathy. No left axillary lymphadenopathy. No internal mammary or mediastinal lymphadenopathy. No hilar lymphadenopathy. The heart size normal. No pericardial effusion.  Lungs / Pleura: Stable appearance of right posterior apical pleural-parenchymal scarring. Subtle anterior right middle lobe subpleural interstitial coarsening is stable in  may reflect previous radiation. Previously described areas of patchy ground-glass attenuation in the right lower lobe (series 5, images 28, 31, and 32) are stable since the prior study and also unchanged when comparing back to 12/02/2012. Scattered tiny nodules in the lingula are also stable since 12/02/2012 likely reflect postinfectious or postinflammatory scarring. No new or progressive findings in either lung.  Bones: Bone windows reveal no worrisome lytic or sclerotic osseous lesions.  Upper Abdomen: Cyst in the posterior right liver is unchanged. The prominent venous collateralization in the left upper quadrant, potentially representing spina renal shunt, is unchanged. The portal vein and splenic vein are patent.  IMPRESSION: Stable exam.  No new or progressive findings in the chest.   Electronically Signed   By: Eric  Mansell M.D.   On: 06/03/2014 09:09    ASSESSMENT: 57 y.o. [place] woman presenting June 2011 with stage IV breast cancer involving the right breast, right axilla, mediastinal lymph nodes, and both lungs, but not the brain, liver, or bones  (1) positive right breast and right axillary lymph node biopsies 07/17/2010 of a clinical T4 N2 M1 invasive ductal carcinoma, grade 1, estrogen receptor 100% positive, progesterone receptor 6% positive, with an MIB-1 of 12%, and no HER-2 amplification.  (2) participated in Phase II tessetaxel study, receiving 2 cycles complicated by thrombocytopenia, anemia requiring transfusion, transaminase elevation, and afebrile neutropenia. Off-study as of September 2011. Chest CT scan September 2011 did show evidence of response.  (3) on letrozole as of October 2011, with continuing response and good tolerance   (4) 06/02/2012 underwent right mastectomy and axillary lymph node sampling (3 lymph nodes removed, all with viable cancer as well as evidence of treatment effect) for a ypT2 ypN1-2 invasive ductal carcinoma, grade 1, 98% estrogen receptor positive,  progesterone receptor negative, with no HER-2 amplification  (5) left jugular vein DVT documented 06/16/2012; left sided Port-A-Cath in place,  On Coumadin.  (6) osteoporosis, on alendronate (most recent bone density in September 2014)    PLAN:  Angalina is doing well as far as her breast cancer is concerned, with no evidence of progression. We are going to continue letrozole and alendronate   as before. We discussed possibly getting rid of her port. I would not feel she needs to be on Lovenox bridging both for port removal, instead she would simply stop her Coumadin 4 days prior to the procedure. We would however resume Lovenox 24 hours after the procedure and continue that until her Coumadin level is again therapeutic. We would then continue Coumadin for 3 months post port removal and then stop.  She is attracted by this possibility, and will discuss it with Dr. Wakefield at her next appointment. Otherwise she will return to see me again in 3 months. We will do a chest x-ray with lab work and physical exam at that visit. She knows to call for any problems that may develop before then. MAGRINAT,GUSTAV C    06/12/2014  

## 2014-06-17 ENCOUNTER — Telehealth (INDEPENDENT_AMBULATORY_CARE_PROVIDER_SITE_OTHER): Payer: Self-pay

## 2014-06-17 NOTE — Telephone Encounter (Signed)
Spoke with pt in regards to her coumadin and that Dr. Jana Hakim has given permission, according to his last office note, for the patient to stop her coumadin 5 days prior to surgery without a lovenox bridge.  Informed her that he is planning on starting her on the lovenox after her surgery and they will put her back on the coumadin once they get her levels regulated again.  The patient verbalized understanding of this and is agreeable with the POC.

## 2014-06-17 NOTE — Telephone Encounter (Signed)
Pt is scheduled to have her PAC removed on 06/27/14 by Dr Donne Hazel.  Pt was unsure if Dr Donne Hazel was aware that she was on Coumadin, and if and when she would need to stop prior to sx. Informed pt that I would send Dr Donne Hazel and his assistant a message requesting info. Informed pt that we would get back in touch with her with a response. Pt verbalized understanding and agrees with POC.

## 2014-06-27 ENCOUNTER — Other Ambulatory Visit: Payer: Self-pay | Admitting: *Deleted

## 2014-06-27 ENCOUNTER — Telehealth: Payer: Self-pay | Admitting: *Deleted

## 2014-06-27 ENCOUNTER — Telehealth (INDEPENDENT_AMBULATORY_CARE_PROVIDER_SITE_OTHER): Payer: Self-pay

## 2014-06-27 ENCOUNTER — Other Ambulatory Visit (INDEPENDENT_AMBULATORY_CARE_PROVIDER_SITE_OTHER): Payer: Self-pay

## 2014-06-27 DIAGNOSIS — Z452 Encounter for adjustment and management of vascular access device: Secondary | ICD-10-CM

## 2014-06-27 MED ORDER — OXYCODONE-ACETAMINOPHEN 5-325 MG PO TABS: 1.0000 | ORAL_TABLET | Freq: Four times a day (QID) | ORAL | Status: DC | PRN

## 2014-06-27 NOTE — Telephone Encounter (Signed)
This RN spoke with pt per need to institute lovenox tomorrow per MD discussion at previous visit.  Per discussion Shantika states and verified with her husband that they can self administer the lovenox ( husband did injection previously ).  Appointment made for coumadin clinic and lab.  This RN informed pt to call if concerns arrive regarding injections.  Prescription called to pt's pharmacy with verification medication is available for pick up today.

## 2014-06-27 NOTE — Telephone Encounter (Signed)
Call received from [facility] per Dr Cristal Generous due to need to start lovenox post port removal.  Pt has port removed this AM- Per Dr Gerarda Fraction note pt is to start lovenox 24 hours post surgery and resume coumadin. She is to continue coumadin until INR theraputic.  This RN spoke with the Coumadin Clinic and obtained appropriate dose for lovenox -  Plan is to contact pt this afternoon to inquire if pt will self administer lovenox or need to come in to this office and to schedule appropriate coumadin clinic appointments to follow INR.  Alisha informed of the above.

## 2014-06-27 NOTE — Telephone Encounter (Signed)
Called pt to notify her that I did speak with Val Dr Magrinat's nurse who will be calling her later today to talk about the Lovenox bridge. Pt understands. Pt made f/u appt for 7/23 with Dr Donne Hazel.

## 2014-06-27 NOTE — Telephone Encounter (Signed)
Spoke to [facility] with Dr Jana Hakim. Val is fully aware of the pt needing the Lovenox bridge after I explained the situation to her today. Val has orders now on the Lovenox bridge and she will contact pt this pm to speak to her about directions. I will notify  Dr Donne Hazel that this is being handled today. I thanked Val for helping [place] take care of the pt today since she just had the surgery this a.m.

## 2014-06-27 NOTE — Telephone Encounter (Signed)
Elmo Putt called on patient's behalf in reference to lovenox bridge.  Port-a-cath removed today.  Patient to be discharged and has not heard from [facility] about lovenox.  Elmo Putt asked to speak with Ms. Cornetto N.P.  Pager [phone] given and Dr. Lora Paula collaborative nurse notified of this call.  Collaborative will call patient at home after discharge.

## 2014-07-04 ENCOUNTER — Ambulatory Visit (HOSPITAL_BASED_OUTPATIENT_CLINIC_OR_DEPARTMENT_OTHER): Payer: BC Managed Care – PPO | Admitting: Pharmacist

## 2014-07-04 ENCOUNTER — Other Ambulatory Visit (HOSPITAL_BASED_OUTPATIENT_CLINIC_OR_DEPARTMENT_OTHER): Payer: BC Managed Care – PPO

## 2014-07-04 DIAGNOSIS — I82C19 Acute embolism and thrombosis of unspecified internal jugular vein: Secondary | ICD-10-CM

## 2014-07-04 DIAGNOSIS — I82409 Acute embolism and thrombosis of unspecified deep veins of unspecified lower extremity: Secondary | ICD-10-CM

## 2014-07-04 LAB — POCT INR: INR: 1.7

## 2014-07-04 LAB — PROTIME-INR
INR: 1.7 — ABNORMAL LOW (ref 2.00–3.50)
Protime: 20.4 Seconds — ABNORMAL HIGH (ref 10.6–13.4)

## 2014-07-04 NOTE — Progress Notes (Signed)
Pt seen in clinic today INR=1.7 after holding coumadin for port removal on 06/27/14 She has no other changes to report. We will cancel any future flush appmts Pt will continue lovenox 100 mg daily for two more days then have her INR repeated She will continue to alternate her 4mg /3mg  of coumadin each day We will see back in clinic on Wed, 7/8 at 9:00 for lab and 9:15 for CC.

## 2014-07-04 NOTE — Patient Instructions (Signed)
Continue current dose; 3mg  alternating with 4mg  and lovenox 100mg  for two more days.  Repeat INR on 07/06/14 at 9am for lab and 9:15 am for coumadin clinic

## 2014-07-06 ENCOUNTER — Other Ambulatory Visit (HOSPITAL_BASED_OUTPATIENT_CLINIC_OR_DEPARTMENT_OTHER): Payer: BC Managed Care – PPO

## 2014-07-06 ENCOUNTER — Ambulatory Visit (HOSPITAL_BASED_OUTPATIENT_CLINIC_OR_DEPARTMENT_OTHER): Payer: BC Managed Care – PPO | Admitting: Pharmacist

## 2014-07-06 DIAGNOSIS — I82C19 Acute embolism and thrombosis of unspecified internal jugular vein: Secondary | ICD-10-CM

## 2014-07-06 DIAGNOSIS — I82409 Acute embolism and thrombosis of unspecified deep veins of unspecified lower extremity: Secondary | ICD-10-CM

## 2014-07-06 LAB — PROTIME-INR
INR: 2.3 (ref 2.00–3.50)
Protime: 27.6 Seconds — ABNORMAL HIGH (ref 10.6–13.4)

## 2014-07-06 LAB — POCT INR: INR: 2.3

## 2014-07-06 NOTE — Progress Notes (Signed)
INR = 2.3 on Coumadin 3 mg/4 mg alt. She took Lovenox injections to bridge post-port removal.  She has completed them at this point. Medications are the same. INR at goal.  No change to Coumadin dose. Return in 2 weeks & if INR still ok then, we can see her in 4 weeks. Kennith Center, Pharm.D., CPP 07/06/2014@9 :36 AM

## 2014-07-18 ENCOUNTER — Other Ambulatory Visit: Payer: Self-pay | Admitting: Oncology

## 2014-07-20 ENCOUNTER — Ambulatory Visit (HOSPITAL_BASED_OUTPATIENT_CLINIC_OR_DEPARTMENT_OTHER): Payer: BC Managed Care – PPO | Admitting: Pharmacist

## 2014-07-20 ENCOUNTER — Telehealth: Payer: Self-pay | Admitting: Oncology

## 2014-07-20 ENCOUNTER — Other Ambulatory Visit (HOSPITAL_BASED_OUTPATIENT_CLINIC_OR_DEPARTMENT_OTHER): Payer: BC Managed Care – PPO

## 2014-07-20 DIAGNOSIS — C7800 Secondary malignant neoplasm of unspecified lung: Secondary | ICD-10-CM

## 2014-07-20 DIAGNOSIS — C50411 Malignant neoplasm of upper-outer quadrant of right female breast: Secondary | ICD-10-CM

## 2014-07-20 DIAGNOSIS — I82409 Acute embolism and thrombosis of unspecified deep veins of unspecified lower extremity: Secondary | ICD-10-CM

## 2014-07-20 DIAGNOSIS — C773 Secondary and unspecified malignant neoplasm of axilla and upper limb lymph nodes: Secondary | ICD-10-CM

## 2014-07-20 DIAGNOSIS — C50919 Malignant neoplasm of unspecified site of unspecified female breast: Secondary | ICD-10-CM

## 2014-07-20 DIAGNOSIS — Z7901 Long term (current) use of anticoagulants: Secondary | ICD-10-CM

## 2014-07-20 DIAGNOSIS — I890 Lymphedema, not elsewhere classified: Secondary | ICD-10-CM

## 2014-07-20 DIAGNOSIS — C50419 Malignant neoplasm of upper-outer quadrant of unspecified female breast: Secondary | ICD-10-CM

## 2014-07-20 DIAGNOSIS — I82C19 Acute embolism and thrombosis of unspecified internal jugular vein: Secondary | ICD-10-CM

## 2014-07-20 LAB — COMPREHENSIVE METABOLIC PANEL (CC13)
ALT: 26 U/L (ref 0–55)
ANION GAP: 10 meq/L (ref 3–11)
AST: 24 U/L (ref 5–34)
Albumin: 3.7 g/dL (ref 3.5–5.0)
Alkaline Phosphatase: 61 U/L (ref 40–150)
BUN: 14.4 mg/dL (ref 7.0–26.0)
CHLORIDE: 111 meq/L — AB (ref 98–109)
CO2: 21 mEq/L — ABNORMAL LOW (ref 22–29)
Calcium: 9 mg/dL (ref 8.4–10.4)
Creatinine: 0.9 mg/dL (ref 0.6–1.1)
Glucose: 91 mg/dl (ref 70–140)
Potassium: 3.9 mEq/L (ref 3.5–5.1)
Sodium: 142 mEq/L (ref 136–145)
Total Bilirubin: 0.59 mg/dL (ref 0.20–1.20)
Total Protein: 6.6 g/dL (ref 6.4–8.3)

## 2014-07-20 LAB — CBC WITH DIFFERENTIAL/PLATELET
BASO%: 0.3 % (ref 0.0–2.0)
BASOS ABS: 0 10*3/uL (ref 0.0–0.1)
EOS ABS: 0 10*3/uL (ref 0.0–0.5)
EOS%: 0 % (ref 0.0–7.0)
HCT: 38.1 % (ref 34.8–46.6)
HEMOGLOBIN: 13 g/dL (ref 11.6–15.9)
LYMPH#: 0.8 10*3/uL — AB (ref 0.9–3.3)
LYMPH%: 23.3 % (ref 14.0–49.7)
MCH: 28.5 pg (ref 25.1–34.0)
MCHC: 34.1 g/dL (ref 31.5–36.0)
MCV: 83.6 fL (ref 79.5–101.0)
MONO#: 0.3 10*3/uL (ref 0.1–0.9)
MONO%: 9.2 % (ref 0.0–14.0)
NEUT#: 2.3 10*3/uL (ref 1.5–6.5)
NEUT%: 67.2 % (ref 38.4–76.8)
NRBC: 0 % (ref 0–0)
Platelets: 88 10*3/uL — ABNORMAL LOW (ref 145–400)
RBC: 4.56 10*6/uL (ref 3.70–5.45)
RDW: 14.9 % — ABNORMAL HIGH (ref 11.2–14.5)
WBC: 3.5 10*3/uL — AB (ref 3.9–10.3)

## 2014-07-20 LAB — PROTIME-INR
INR: 1.7 — AB (ref 2.00–3.50)
Protime: 20.4 Seconds — ABNORMAL HIGH (ref 10.6–13.4)

## 2014-07-20 LAB — POCT INR: INR: 1.7

## 2014-07-20 NOTE — Progress Notes (Signed)
INR just below goal today Pt is doing well with no complaints Pt seen in clinic today with husband INR slightly below goal today due to missing 1 dose on Monday 7/20 (pt just forgot to take her coumadin before bed) Pt reports no unusual bleeding or bruising No S/Sx of clotting No medication or diet changes  Plan:  Take 4 mg tonight to help boost then Continue 3mg  alternating with 4mg   Repeat INR on 08/10/14 at 9am for lab and 9:15 am for coumadin clinic.

## 2014-07-20 NOTE — Patient Instructions (Signed)
INR just below goal  Take 4 mg tonight to help boost then Continue 3mg  alternating with 4mg    Repeat INR on 08/10/14 at 9am for lab and 9:15 am for coumadin clinic.

## 2014-07-20 NOTE — Telephone Encounter (Signed)
per staff message sch CC & lab per Chris-pt aware

## 2014-07-21 ENCOUNTER — Ambulatory Visit (INDEPENDENT_AMBULATORY_CARE_PROVIDER_SITE_OTHER): Payer: BC Managed Care – PPO | Admitting: General Surgery

## 2014-07-21 ENCOUNTER — Encounter (INDEPENDENT_AMBULATORY_CARE_PROVIDER_SITE_OTHER): Payer: Self-pay | Admitting: General Surgery

## 2014-07-21 VITALS — BP 128/82 | HR 76 | Temp 98.1°F | Resp 14 | Ht 64.0 in | Wt 142.4 lb

## 2014-07-21 DIAGNOSIS — Z09 Encounter for follow-up examination after completed treatment for conditions other than malignant neoplasm: Secondary | ICD-10-CM

## 2014-07-21 NOTE — Progress Notes (Signed)
Subjective:     Patient ID: Nicole [name], female   DOB: August 15, 1956, 58 y.o.   MRN: [id number]  HPI 58 yof with stage IV breast cancer wanted port out.  Took this out uneventfully and she returns today doing well without complaint.   Review of Systems     Objective:   Physical Exam Healing incision without infection    Assessment:     Stage IV breast cancer s/p port removal     Plan:     She is doing well. Return to full activity. She is following up with med onc so I will see her as needed

## 2014-07-24 ENCOUNTER — Other Ambulatory Visit: Payer: Self-pay | Admitting: Oncology

## 2014-07-24 DIAGNOSIS — C50411 Malignant neoplasm of upper-outer quadrant of right female breast: Secondary | ICD-10-CM

## 2014-08-10 ENCOUNTER — Ambulatory Visit (HOSPITAL_BASED_OUTPATIENT_CLINIC_OR_DEPARTMENT_OTHER): Payer: Self-pay | Admitting: Pharmacist

## 2014-08-10 ENCOUNTER — Telehealth: Payer: Self-pay | Admitting: Oncology

## 2014-08-10 ENCOUNTER — Other Ambulatory Visit (HOSPITAL_BASED_OUTPATIENT_CLINIC_OR_DEPARTMENT_OTHER): Payer: BC Managed Care – PPO

## 2014-08-10 DIAGNOSIS — I82C19 Acute embolism and thrombosis of unspecified internal jugular vein: Secondary | ICD-10-CM

## 2014-08-10 DIAGNOSIS — Z7901 Long term (current) use of anticoagulants: Secondary | ICD-10-CM

## 2014-08-10 DIAGNOSIS — I82409 Acute embolism and thrombosis of unspecified deep veins of unspecified lower extremity: Secondary | ICD-10-CM

## 2014-08-10 LAB — POCT INR: INR: 3

## 2014-08-10 LAB — PROTIME-INR
INR: 3 (ref 2.00–3.50)
Protime: 36 Seconds — ABNORMAL HIGH (ref 10.6–13.4)

## 2014-08-10 NOTE — Telephone Encounter (Signed)
per Melissa to sch pt CC-pt aware

## 2014-08-10 NOTE — Progress Notes (Signed)
INR remains at goal of 2-3 on coumadin 3mg  alt with 4mg .  No changes in meds.  No bleeding/bruising.  Nicole [name] had port taken out 06/27/14.  Per Dr. Jana Hakim note on 06/07/14, will continue coumadin 3 months after port out.  Nicole [name] should be finishing up coumadin around the end of September.  Will continue current coumadin dose and f/u in September after appt with Dr. Jana Hakim.  Nicole [name] will discuss plan to stop coumadin at the appt with Dr. Jana Hakim.

## 2014-09-07 ENCOUNTER — Other Ambulatory Visit: Payer: Self-pay | Admitting: *Deleted

## 2014-09-07 ENCOUNTER — Ambulatory Visit (HOSPITAL_BASED_OUTPATIENT_CLINIC_OR_DEPARTMENT_OTHER): Payer: BC Managed Care – PPO | Admitting: Oncology

## 2014-09-07 ENCOUNTER — Other Ambulatory Visit (HOSPITAL_BASED_OUTPATIENT_CLINIC_OR_DEPARTMENT_OTHER): Payer: BC Managed Care – PPO

## 2014-09-07 ENCOUNTER — Ambulatory Visit (HOSPITAL_BASED_OUTPATIENT_CLINIC_OR_DEPARTMENT_OTHER): Payer: BC Managed Care – PPO

## 2014-09-07 ENCOUNTER — Telehealth: Payer: Self-pay | Admitting: Oncology

## 2014-09-07 VITALS — BP 170/77 | HR 89 | Temp 98.1°F | Resp 20 | Ht 64.0 in | Wt 141.5 lb

## 2014-09-07 DIAGNOSIS — C50919 Malignant neoplasm of unspecified site of unspecified female breast: Secondary | ICD-10-CM

## 2014-09-07 DIAGNOSIS — C50411 Malignant neoplasm of upper-outer quadrant of right female breast: Secondary | ICD-10-CM

## 2014-09-07 DIAGNOSIS — C778 Secondary and unspecified malignant neoplasm of lymph nodes of multiple regions: Secondary | ICD-10-CM

## 2014-09-07 DIAGNOSIS — I82409 Acute embolism and thrombosis of unspecified deep veins of unspecified lower extremity: Secondary | ICD-10-CM

## 2014-09-07 DIAGNOSIS — Z86718 Personal history of other venous thrombosis and embolism: Secondary | ICD-10-CM

## 2014-09-07 DIAGNOSIS — C7800 Secondary malignant neoplasm of unspecified lung: Secondary | ICD-10-CM

## 2014-09-07 DIAGNOSIS — I890 Lymphedema, not elsewhere classified: Secondary | ICD-10-CM

## 2014-09-07 DIAGNOSIS — C50419 Malignant neoplasm of upper-outer quadrant of unspecified female breast: Secondary | ICD-10-CM

## 2014-09-07 DIAGNOSIS — Z170 Estrogen receptor positive status [ER+]: Secondary | ICD-10-CM

## 2014-09-07 DIAGNOSIS — C773 Secondary and unspecified malignant neoplasm of axilla and upper limb lymph nodes: Secondary | ICD-10-CM

## 2014-09-07 DIAGNOSIS — M810 Age-related osteoporosis without current pathological fracture: Secondary | ICD-10-CM

## 2014-09-07 LAB — CBC WITH DIFFERENTIAL/PLATELET
BASO%: 0.3 % (ref 0.0–2.0)
BASOS ABS: 0 10*3/uL (ref 0.0–0.1)
EOS ABS: 0 10*3/uL (ref 0.0–0.5)
EOS%: 0 % (ref 0.0–7.0)
HEMATOCRIT: 38.5 % (ref 34.8–46.6)
HEMOGLOBIN: 13 g/dL (ref 11.6–15.9)
LYMPH%: 22.6 % (ref 14.0–49.7)
MCH: 28.6 pg (ref 25.1–34.0)
MCHC: 33.8 g/dL (ref 31.5–36.0)
MCV: 84.8 fL (ref 79.5–101.0)
MONO#: 0.2 10*3/uL (ref 0.1–0.9)
MONO%: 7.3 % (ref 0.0–14.0)
NEUT%: 69.8 % (ref 38.4–76.8)
NEUTROS ABS: 2.3 10*3/uL (ref 1.5–6.5)
Platelets: 86 10*3/uL — ABNORMAL LOW (ref 145–400)
RBC: 4.54 10*6/uL (ref 3.70–5.45)
RDW: 14.7 % — ABNORMAL HIGH (ref 11.2–14.5)
WBC: 3.3 10*3/uL — ABNORMAL LOW (ref 3.9–10.3)
lymph#: 0.7 10*3/uL — ABNORMAL LOW (ref 0.9–3.3)

## 2014-09-07 LAB — COMPREHENSIVE METABOLIC PANEL (CC13)
ALT: 23 U/L (ref 0–55)
ANION GAP: 11 meq/L (ref 3–11)
AST: 22 U/L (ref 5–34)
Albumin: 3.7 g/dL (ref 3.5–5.0)
Alkaline Phosphatase: 65 U/L (ref 40–150)
BUN: 15.8 mg/dL (ref 7.0–26.0)
CALCIUM: 8.7 mg/dL (ref 8.4–10.4)
CO2: 21 meq/L — AB (ref 22–29)
CREATININE: 1 mg/dL (ref 0.6–1.1)
Chloride: 110 mEq/L — ABNORMAL HIGH (ref 98–109)
Glucose: 98 mg/dl (ref 70–140)
POTASSIUM: 3.8 meq/L (ref 3.5–5.1)
Sodium: 142 mEq/L (ref 136–145)
Total Bilirubin: 0.77 mg/dL (ref 0.20–1.20)
Total Protein: 6.8 g/dL (ref 6.4–8.3)

## 2014-09-07 LAB — PROTIME-INR
INR: 2.4 (ref 2.00–3.50)
PROTIME: 28.8 s — AB (ref 10.6–13.4)

## 2014-09-07 LAB — POCT INR: INR: 2.4

## 2014-09-07 NOTE — Progress Notes (Signed)
ID: Nicole [name]   DOB: 1956-12-08  MR#: [id number]  PXT#:[id number]  PCP:  GYN:  SURolm Bookbinder, MD OTHER MD: Gery Pray, MD  CHIEF COMPLAINT:  Metastatic Breast Cancer CURRENT TREATMENT: anti-estrogen therapy  HISTORY OF PRESENT ILLNESS: From Dr. Collier Salina [name]'s 07/25/2010 note:  "This woman has not had any significant medical intervention for some time.  Her last mammogram was about seven years ago.  She noted a right breast mass about a year ago and did not seek immediate medical attention for this.  She ultimately, after some time, admitted this to her husband and self-referred herself for intervention.  She had a mammogram on 07/17/2010 with an ultrasound of the right breast.  This showed a lobulated mass upper right outer quadrant measuring at least 6.3 x 7.3 cm and large right axillary lymph nodes were also noted.  There was skin thickening overlying the mass.  On physical exam, this mass was about an 8 cm with skin dimpling noted. Discolored area in the skin over the mass, fullness in the axilla.  Biopsy was recommended, which took place on 07/17/2010.  Pathology showed invasive ductal cancer involving both lymph node and breast.  The HER-2 was not amplified.  ER and PR both positive at 100% and 6% respectively.  Proliferative index was 12% involving both the lymph node and breast mass.  An MRI scan of both breasts was performed on 07/22/2010, essentially which showed a large heterogeneously enhancing mass of the right breast with washout kinetics measuring 6.4 x 4.4 x 5.0 cm.  Numerous satellite nodules were seen throughout the dominant mass.  There were several suspicious nodules in the upper central and upper inner quadrant of the right breast.  There were two enhancing subcentimeter nodules seen in the right pectoralis muscle.  No obvious chest wall nodules were seen; however, there was seen some metastatic adenopathy in the mediastinum as well as hila with a 2.4 x 2.0 cm mediastinal  lymph node as well as 1.2 x 3.0 cm subcarinal lymph node.  There was a right hilar and probable left hilar adenopathy.  Bilateral pulmonary nodules were seen and a right T2 lesion was seen anterior right liver as well."  Her subsequent history is as detailed below.  INTERVAL HISTORY: Nicole [name] returns today for followup of her stage IV breast cancer accompanied by her husband Nicole [name]. Since her last visit here she had her port removed by Dr. Donne Hazel. That went uneventfully. She has continued on Coumadin, but now we can stop that. She is tolerating the alendronate with no reflux or other symptoms. She is tolerating the letrozole with no hot flashes and no significant vaginal dryness problems   REVIEW OF SYSTEMS: Nicole [name] continues to work outside her home. It is mostly a sedentary, computer.. She has a treadmill but "it is covered with boxes". She doesn't really exercise and we did discuss that again this visit. She has some easy bruising secondary to her Coumadin, a detailed review of systems today was otherwise entirely stable.  PAST MEDICAL HISTORY: Past Medical History  Diagnosis Date  . IBD (inflammatory bowel disease)     resolved  . Heart murmur     mild, no cardiologist  . Blood transfusion 2012  . Breast CA     breast ca dx 06/2010, stage 4, right   . Radiation 07/21/12-09/07/12    5940 cGy    PAST SURGICAL HISTORY: Past Surgical History  Procedure Laterality Date  . Portacath placement  2011  left side  . Dilation and curettage of uterus  2004?  [name] Mastectomy  06/02/12    right breast  . Port-a-cath removal      FAMILY HISTORY Family History  Problem Relation Age of Onset  . Cancer Neg Hx    patient's father died at the age of 73 from heart disease. Patient's mother died at the age of 63 status post CABG. The patient had no brothers, 2 sisters. There is no history of breast or ovarian cancer in the family.  GYNECOLOGIC HISTORY:  (Updated 03/14/2014)  Menarche age 55, she is GX  P0. She does not recall when she went through menopause. She never took hormone replacement.  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 Husband Nicole [name] is retired from [employer]. They have no children and no pets. They attend a local [place].   ADVANCED DIRECTIVES: Not in place  HEALTH MAINTENANCE: (updated 03/14/2014)  History  Substance Use Topics  . Smoking status: Never Smoker   . Smokeless tobacco: Never Used  . Alcohol Use: No     Colonoscopy: Not on file  PAP:Not on file  Bone density: 09/27/2013, osteoporosis with a T score of -2.8  Lipid panel: Not on file   No Known Allergies  Current Outpatient Prescriptions  Medication Sig Dispense Refill  . alendronate (FOSAMAX) 70 MG tablet TAKE 1 TABLET BY MOUTH ONCE A WEEK  12 tablet  1  . Calcium Carbonate (CALTRATE 600 PO) Take by mouth 2 (two) times daily.       . Cholecalciferol (VITAMIN D PO) Take 2,000 Units by mouth once.       [name] letrozole (FEMARA) 2.5 MG tablet TAKE 1 TABLET (2.5 MG TOTAL) BY MOUTH DAILY.  90 tablet  1  . venlafaxine XR (EFFEXOR-XR) 75 MG 24 hr capsule TAKE 1 CAPSULE (75 MG TOTAL) BY MOUTH DAILY.  90 capsule  1  . warfarin (COUMADIN) 2 MG tablet ALTERNATE DAILY WITH 1 AND 1/2 (3 MG) TABLETS AND 2 TABLETS (4MG) OR AS DIRECTED BY COUMADIN CLINIC  60 tablet  1   No current facility-administered medications for this visit.    OBJECTIVE: Middle-aged white woman who appears stated age 58 Vitals:   09/07/14 1003  BP: 170/77  Pulse: 89  Temp: 98.1 F (36.7 C)  Resp: 20     Body mass index is 24.28 kg/(m^2).    ECOG FS: 0 Filed Weights   09/07/14 1003  Weight: 141 lb 8 oz (64.184 kg)   Sclerae unicteric, pupils round and reactive Oropharynx clear and moist, teeth in good repair No cervical or supraclavicular adenopathy Lungs no rales or rhonchi Heart regular rate and rhythm Abd soft, nontender, positive bowel  sounds MSK no focal spinal tenderness, no upper extremity lymphedema Neuro: nonfocal, well oriented, positive affect Breasts: The right breast is status post mastectomy and radiation. There are significant telangiectasias, as previously noted. There is no evidence of chest wall recurrence.The right axilla is benign. Left breast is unremarkable.  LAB RESULTS: Lab Results  Component Value Date   WBC 3.3* 09/07/2014   NEUTROABS 2.3 09/07/2014   HGB 13.0 09/07/2014   HCT 38.5 09/07/2014   MCV 84.8 09/07/2014   PLT 86* 09/07/2014      Chemistry      Component Value Date/Time   NA 142 07/20/2014 0931   NA 140 08/21/2012 1307   K 3.9 07/20/2014 0931   K  3.9 08/21/2012 1307   CL 109* 04/20/2013 0859   CL 105 08/21/2012 1307   CO2 21* 07/20/2014 0931   CO2 24 08/21/2012 1307   BUN 14.4 07/20/2014 0931   BUN 15 08/21/2012 1307   CREATININE 0.9 07/20/2014 0931   CREATININE 0.93 08/21/2012 1307      Component Value Date/Time   CALCIUM 9.0 07/20/2014 0931   CALCIUM 9.2 08/21/2012 1307   ALKPHOS 61 07/20/2014 0931   ALKPHOS 59 08/21/2012 1307   AST 24 07/20/2014 0931   AST 24 08/21/2012 1307   ALT 26 07/20/2014 0931   ALT 30 08/21/2012 1307   BILITOT 0.59 07/20/2014 0931   BILITOT 0.6 08/21/2012 1307        STUDIES: No results found.   ASSESSMENT: 58 y.o. [place] woman presenting June 2011 with stage IV breast cancer involving the right breast, right axilla, mediastinal lymph nodes, and both lungs, but not the brain, liver, or bones  (1) positive right breast and right axillary lymph node biopsies 07/17/2010 of a clinical T4 N2 M1 invasive ductal carcinoma, grade 1, estrogen receptor 100% positive, progesterone receptor 6% positive, with an MIB-1 of 12%, and no HER-2 amplification.  (2) participated in Phase II tessetaxel study, receiving 2 cycles complicated by thrombocytopenia, anemia requiring transfusion, transaminase elevation, and afebrile neutropenia. Off-study as of September 2011. Chest CT scan  September 2011 did show evidence of response.  (3) on letrozole as of October 2011, with continuing response and good tolerance   (4) 06/02/2012 underwent right mastectomy and axillary lymph node sampling (3 lymph nodes removed, all with viable cancer as well as evidence of treatment effect) for a ypT2 ypN1-2 invasive ductal carcinoma, grade 1, 98% estrogen receptor positive, progesterone receptor negative, with no HER-2 amplification  (5) left jugular vein DVT documented 06/16/2012; left sided Port-A-Cath removed mid July 2015; Coumadin stopped 09/07/2014.  (6) osteoporosis, on alendronate (most recent bone density in September 2014)    PLAN:  Arianah is doing terrific as far as her breast cancer is concerned. We are stopping the Coumadin. We're continuing the letrozole and alendronate.  Today we discussed Palbociclib. She understands this as double the time to progression and 2 studies, accompanied by different anti-estrogens. However she is having such a good response to letrozole alone I don't see a compelling reason to add Palbociclib at this point. She knows that it is "in our back pocket" and we can use it whenever there is progression and we switched to a different anti-estrogen (which most likely would be fulvestrant).  She had questions regarding tumor markers. We discussed why we have stopped using them I except in very rare cases. In general these are not reliable and they give both false sense of security and in many cases falls alarms leading to multiple unnecessary tests. She is comfortable with not following the ease.  I have again encouraged her to start a walking program. She knows the goal is 45 minutes 5 times a week.  We're going to restage her with a CT of the chest in late December. She will see me again in mid January. She knows to call for any problems that may develop before that visit.   )MAGRINAT,GUSTAV C    09/07/2014

## 2014-09-07 NOTE — Progress Notes (Signed)
*  No charge visit*  At patient's appointment with Dr. Jana Hakim prior to scheduled [facility] visit, it was decided that Nicole [name] would be discontinuing Coumadin therapy. Therefore, patient did not see Coumadin Clinic pharmacist today.   INR orders will be removed, and therapy will be discontinued.

## 2014-09-07 NOTE — Telephone Encounter (Signed)
per pof to sch pt appt-pt aware of XRAY to be done will do 01/03/15 when she comes for labs-gave pt copy of lab

## 2014-09-28 ENCOUNTER — Ambulatory Visit: Payer: BC Managed Care – PPO

## 2014-10-03 ENCOUNTER — Ambulatory Visit
Admission: RE | Admit: 2014-10-03 | Discharge: 2014-10-03 | Disposition: A | Discharge status: Home / Self Care | Payer: BC Managed Care – PPO | Source: Ambulatory Visit | Attending: Physician Assistant | Admitting: Physician Assistant

## 2014-10-03 DIAGNOSIS — Z1231 Encounter for screening mammogram for malignant neoplasm of breast: Secondary | ICD-10-CM

## 2014-12-02 ENCOUNTER — Other Ambulatory Visit: Payer: Self-pay | Admitting: Oncology

## 2014-12-02 DIAGNOSIS — C50911 Malignant neoplasm of unspecified site of right female breast: Secondary | ICD-10-CM

## 2014-12-19 ENCOUNTER — Other Ambulatory Visit: Payer: Self-pay | Admitting: Oncology

## 2014-12-22 ENCOUNTER — Other Ambulatory Visit: Payer: Self-pay | Admitting: *Deleted

## 2014-12-22 ENCOUNTER — Telehealth: Payer: Self-pay | Admitting: Oncology

## 2014-12-22 NOTE — Telephone Encounter (Signed)
S/w pt confirming labs same day as CT scan per 12/24 POF.... Cherylann Banas

## 2014-12-28 ENCOUNTER — Other Ambulatory Visit (HOSPITAL_BASED_OUTPATIENT_CLINIC_OR_DEPARTMENT_OTHER): Payer: BC Managed Care – PPO

## 2014-12-28 ENCOUNTER — Ambulatory Visit (HOSPITAL_COMMUNITY)
Admission: RE | Admit: 2014-12-28 | Discharge: 2014-12-28 | Disposition: A | Discharge status: Home / Self Care | Payer: BC Managed Care – PPO | Source: Ambulatory Visit | Attending: Oncology | Admitting: Oncology

## 2014-12-28 ENCOUNTER — Encounter (HOSPITAL_COMMUNITY): Payer: Self-pay

## 2014-12-28 DIAGNOSIS — C7800 Secondary malignant neoplasm of unspecified lung: Secondary | ICD-10-CM

## 2014-12-28 DIAGNOSIS — C50411 Malignant neoplasm of upper-outer quadrant of right female breast: Secondary | ICD-10-CM

## 2014-12-28 DIAGNOSIS — Z9221 Personal history of antineoplastic chemotherapy: Secondary | ICD-10-CM | POA: Diagnosis not present

## 2014-12-28 DIAGNOSIS — I890 Lymphedema, not elsewhere classified: Secondary | ICD-10-CM

## 2014-12-28 DIAGNOSIS — C50919 Malignant neoplasm of unspecified site of unspecified female breast: Secondary | ICD-10-CM

## 2014-12-28 DIAGNOSIS — Z9011 Acquired absence of right breast and nipple: Secondary | ICD-10-CM | POA: Diagnosis not present

## 2014-12-28 DIAGNOSIS — I82409 Acute embolism and thrombosis of unspecified deep veins of unspecified lower extremity: Secondary | ICD-10-CM

## 2014-12-28 DIAGNOSIS — Z923 Personal history of irradiation: Secondary | ICD-10-CM | POA: Diagnosis not present

## 2014-12-28 DIAGNOSIS — J8410 Pulmonary fibrosis, unspecified: Secondary | ICD-10-CM | POA: Diagnosis not present

## 2014-12-28 DIAGNOSIS — M4856XS Collapsed vertebra, not elsewhere classified, lumbar region, sequela of fracture: Secondary | ICD-10-CM | POA: Insufficient documentation

## 2014-12-28 DIAGNOSIS — J984 Other disorders of lung: Secondary | ICD-10-CM | POA: Diagnosis not present

## 2014-12-28 LAB — COMPREHENSIVE METABOLIC PANEL (CC13)
ALBUMIN: 3.7 g/dL (ref 3.5–5.0)
ALK PHOS: 66 U/L (ref 40–150)
ALT: 20 U/L (ref 0–55)
AST: 20 U/L (ref 5–34)
Anion Gap: 9 mEq/L (ref 3–11)
BUN: 13.2 mg/dL (ref 7.0–26.0)
CALCIUM: 8.9 mg/dL (ref 8.4–10.4)
CHLORIDE: 110 meq/L — AB (ref 98–109)
CO2: 24 mEq/L (ref 22–29)
Creatinine: 1 mg/dL (ref 0.6–1.1)
EGFR: 63 mL/min/{1.73_m2} — AB (ref >90)
GLUCOSE: 99 mg/dL (ref 70–140)
POTASSIUM: 3.7 meq/L (ref 3.5–5.1)
SODIUM: 144 meq/L (ref 136–145)
TOTAL PROTEIN: 6.6 g/dL (ref 6.4–8.3)
Total Bilirubin: 0.81 mg/dL (ref 0.20–1.20)

## 2014-12-28 LAB — CBC WITH DIFFERENTIAL/PLATELET
BASO%: 0.3 % (ref 0.0–2.0)
Basophils Absolute: 0 10*3/uL (ref 0.0–0.1)
EOS ABS: 0 10*3/uL (ref 0.0–0.5)
EOS%: 0 % (ref 0.0–7.0)
HEMATOCRIT: 40.1 % (ref 34.8–46.6)
HGB: 13.6 g/dL (ref 11.6–15.9)
LYMPH%: 20.8 % (ref 14.0–49.7)
MCH: 28.7 pg (ref 25.1–34.0)
MCHC: 33.9 g/dL (ref 31.5–36.0)
MCV: 84.6 fL (ref 79.5–101.0)
MONO#: 0.4 10*3/uL (ref 0.1–0.9)
MONO%: 12.4 % (ref 0.0–14.0)
NEUT%: 66.5 % (ref 38.4–76.8)
NEUTROS ABS: 2.4 10*3/uL (ref 1.5–6.5)
PLATELETS: 79 10*3/uL — AB (ref 145–400)
RBC: 4.74 10*6/uL (ref 3.70–5.45)
RDW: 14.6 % — ABNORMAL HIGH (ref 11.2–14.5)
WBC: 3.6 10*3/uL — AB (ref 3.9–10.3)
lymph#: 0.7 10*3/uL — ABNORMAL LOW (ref 0.9–3.3)

## 2014-12-28 MED ORDER — IOHEXOL 300 MG/ML  SOLN
80.0000 mL | Freq: Once | INTRAMUSCULAR | Status: AC | PRN
  Administered 2014-12-28: 80 mL via INTRAVENOUS

## 2014-12-30 HISTORY — PX: COLONOSCOPY: SHX174

## 2015-01-03 ENCOUNTER — Other Ambulatory Visit: Payer: BC Managed Care – PPO

## 2015-01-10 ENCOUNTER — Ambulatory Visit (HOSPITAL_BASED_OUTPATIENT_CLINIC_OR_DEPARTMENT_OTHER): Payer: BLUE CROSS/BLUE SHIELD | Admitting: Oncology

## 2015-01-10 VITALS — BP 141/81 | HR 91 | Temp 97.5°F | Resp 18 | Ht 64.0 in | Wt 144.7 lb

## 2015-01-10 DIAGNOSIS — D72819 Decreased white blood cell count, unspecified: Secondary | ICD-10-CM

## 2015-01-10 DIAGNOSIS — Z86718 Personal history of other venous thrombosis and embolism: Secondary | ICD-10-CM

## 2015-01-10 DIAGNOSIS — I890 Lymphedema, not elsewhere classified: Secondary | ICD-10-CM

## 2015-01-10 DIAGNOSIS — C773 Secondary and unspecified malignant neoplasm of axilla and upper limb lymph nodes: Secondary | ICD-10-CM

## 2015-01-10 DIAGNOSIS — D696 Thrombocytopenia, unspecified: Secondary | ICD-10-CM

## 2015-01-10 DIAGNOSIS — C50411 Malignant neoplasm of upper-outer quadrant of right female breast: Secondary | ICD-10-CM

## 2015-01-10 DIAGNOSIS — C50911 Malignant neoplasm of unspecified site of right female breast: Secondary | ICD-10-CM

## 2015-01-10 DIAGNOSIS — C7800 Secondary malignant neoplasm of unspecified lung: Secondary | ICD-10-CM

## 2015-01-10 DIAGNOSIS — I82409 Acute embolism and thrombosis of unspecified deep veins of unspecified lower extremity: Secondary | ICD-10-CM

## 2015-01-10 DIAGNOSIS — M810 Age-related osteoporosis without current pathological fracture: Secondary | ICD-10-CM

## 2015-01-10 MED ORDER — ALENDRONATE SODIUM 70 MG PO TABS: 70.0000 mg | ORAL_TABLET | ORAL | Status: DC

## 2015-01-10 MED ORDER — LETROZOLE 2.5 MG PO TABS: 2.5000 mg | ORAL_TABLET | Freq: Every day | ORAL | Status: DC

## 2015-01-10 NOTE — Progress Notes (Signed)
ID: Nicole [name]   DOB: 1956-03-11  MR#: [id number]  EML#:[id number]  PCP:  GYN:  SURolm Bookbinder, MD OTHER MD: Gery Pray, MD  CHIEF COMPLAINT:  Metastatic Breast Cancer  CURRENT TREATMENT: Letrozole, alendronate  HISTORY OF PRESENT ILLNESS: From Dr. Collier Salina [name]'s 07/25/2010 note:  "This woman has not had any significant medical intervention for some time.  Her last mammogram was about seven years ago.  She noted a right breast mass about a year ago and did not seek immediate medical attention for this.  She ultimately, after some time, admitted this to her husband and self-referred herself for intervention.  She had a mammogram on 07/17/2010 with an ultrasound of the right breast.  This showed a lobulated mass upper right outer quadrant measuring at least 6.3 x 7.3 cm and large right axillary lymph nodes were also noted.  There was skin thickening overlying the mass.  On physical exam, this mass was about an 8 cm with skin dimpling noted. Discolored area in the skin over the mass, fullness in the axilla.  Biopsy was recommended, which took place on 07/17/2010.  Pathology showed invasive ductal cancer involving both lymph node and breast.  The HER-2 was not amplified.  ER and PR both positive at 100% and 6% respectively.  Proliferative index was 12% involving both the lymph node and breast mass.  An MRI scan of both breasts was performed on 07/22/2010, essentially which showed a large heterogeneously enhancing mass of the right breast with washout kinetics measuring 6.4 x 4.4 x 5.0 cm.  Numerous satellite nodules were seen throughout the dominant mass.  There were several suspicious nodules in the upper central and upper inner quadrant of the right breast.  There were two enhancing subcentimeter nodules seen in the right pectoralis muscle.  No obvious chest wall nodules were seen; however, there was seen some metastatic adenopathy in the mediastinum as well as hila with a 2.4 x 2.0 cm  mediastinal lymph node as well as 1.2 x 3.0 cm subcarinal lymph node.  There was a right hilar and probable left hilar adenopathy.  Bilateral pulmonary nodules were seen and a right T2 lesion was seen anterior right liver as well."  Her subsequent history is as detailed below.  INTERVAL HISTORY: Nicole [name] returns today for followup of her stage IV breast cancer accompanied by her husband Nicole [name]. The interval history is generally unremarkable. She continues on letrozole, with excellent tolerance. She takes alendronate once a week, and a very appropriate fashion (vertical, no other meds, with a glass of water) and has had no reflux or other complications from that. She just had a restaging CT of the chest which shows no evidence of measurable disease in the chest  REVIEW OF SYSTEMS: Nicole [name] never did start an exercise program, which we discussed last visit. She did acquire a York T puppy April 2015 and then inherited Nicole [name] (Nicole [name]'s mother died 14-Dec-2014 at the age of 73). She is considering walking these dogs daily. Aside from these issues, she has some joint pain "here and there", but this is not more persistent or intense than before. A detailed review of systems today was otherwise noncontributory  PAST MEDICAL HISTORY: Past Medical History  Diagnosis Date  . IBD (inflammatory bowel disease)     resolved  . Heart murmur     mild, no cardiologist  . Blood transfusion 2012  . Radiation 07/21/12-09/07/12    5940 cGy  . Breast CA  breast ca dx 06/2010, stage 4, right     PAST SURGICAL HISTORY: Past Surgical History  Procedure Laterality Date  . Portacath placement  2011    left side  . Dilation and curettage of uterus  2004?  [name] Mastectomy  06/02/12    right breast  . Port-a-cath removal      FAMILY HISTORY Family History  Problem Relation Age of Onset  . Cancer Neg Hx    patient's father died at the age of 60 from heart disease. Patient's mother died at the age of  74 status post CABG. The patient had no brothers, 2 sisters. There is no history of breast or ovarian cancer in the family.  GYNECOLOGIC HISTORY:  (Updated 03/14/2014)  Menarche age 42, she is GX P0. She does not recall when she went through menopause. She never took hormone replacement.  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 Husband Nicole [name] is retired from [employer]. They have no children and no pets. They attend a local Nicole [name].   ADVANCED DIRECTIVES: Not in place  HEALTH MAINTENANCE: (updated 03/14/2014)  History  Substance Use Topics  . Smoking status: Never Smoker   . Smokeless tobacco: Never Used  . Alcohol Use: No     Colonoscopy: Never  PAP: Remote  Bone density: 09/27/2013, osteoporosis with a T score of -2.8  Lipid panel: Not on file   No Known Allergies  Current Outpatient Prescriptions  Medication Sig Dispense Refill  . alendronate (FOSAMAX) 70 MG tablet TAKE 1 TABLET BY MOUTH ONCE A WEEK 12 tablet 1  . Calcium Carbonate (CALTRATE 600 PO) Take by mouth 2 (two) times daily.     . Cholecalciferol (VITAMIN D PO) Take 2,000 Units by mouth once.     [name] letrozole (FEMARA) 2.5 MG tablet TAKE 1 TABLET BY MOUTH EVERY DAY 90 tablet 1  . venlafaxine XR (EFFEXOR-XR) 75 MG 24 hr capsule TAKE ONE CAPSULE BY MOUTH EVERY DAY 90 capsule 0   No current facility-administered medications for this visit.    OBJECTIVE: Middle-aged white woman in no acute distress Filed Vitals:   01/10/15 1212  BP: 141/81  Pulse: 91  Temp: 97.5 F (36.4 C)  Resp: 18     Body mass index is 24.83 kg/(m^2).    ECOG FS: 0 Filed Weights   01/10/15 1212  Weight: 144 lb 11.2 oz (65.635 kg)   Sclerae unicteric, pupils round and reactive Oropharynx clear, good dentition No cervical or supraclavicular adenopathy Lungs no rales or rhonchi Heart regular rate and rhythm Abd soft, nontender, positive bowel sounds MSK no  focal spinal tenderness, no left upper extremity lymphedema and no obvious swelling or congestion in the left lower neck Neuro: nonfocal, well oriented, positive affect Breasts: The right breast is status post mastectomy and radiation. There are significant telangiectasias, as previously noted. There is no evidence of chest wall recurrence.The right axilla is benign. Left breast is unremarkable.  LAB RESULTS: Lab Results  Component Value Date   WBC 3.6* 12/28/2014   NEUTROABS 2.4 12/28/2014   HGB 13.6 12/28/2014   HCT 40.1 12/28/2014   MCV 84.6 12/28/2014   PLT 79* 12/28/2014      Chemistry      Component Value Date/Time   NA 144 12/28/2014 0853   NA 140 08/21/2012 1307   K 3.7 12/28/2014 0853   K 3.9 08/21/2012 1307   CL 109* 04/20/2013 0859  CL 105 08/21/2012 1307   CO2 24 12/28/2014 0853   CO2 24 08/21/2012 1307   BUN 13.2 12/28/2014 0853   BUN 15 08/21/2012 1307   CREATININE 1.0 12/28/2014 0853   CREATININE 0.93 08/21/2012 1307      Component Value Date/Time   CALCIUM 8.9 12/28/2014 0853   CALCIUM 9.2 08/21/2012 1307   ALKPHOS 66 12/28/2014 0853   ALKPHOS 59 08/21/2012 1307   AST 20 12/28/2014 0853   AST 24 08/21/2012 1307   ALT 20 12/28/2014 0853   ALT 30 08/21/2012 1307   BILITOT 0.81 12/28/2014 0853   BILITOT 0.6 08/21/2012 1307        STUDIES: Dg Chest 2 View  12/28/2014   CLINICAL DATA:  History of lung metastasis, followup  EXAM: CHEST  2 VIEW  COMPARISON:  CT chest of 06/03/2014 and chest x-ray of 06/14/2013  FINDINGS: No focal infiltrate or effusion is seen. Minimal scarring is noted in the right upper lung field and in the right apex from prior radiation therapy. Changes of right mastectomy are noted with surgical clips overlying the right axilla. Mediastinal and hilar contours are unchanged. The heart is within normal limits in size. Only mild degenerative change is noted in the mid to lower thoracic spine.  IMPRESSION: Stable chest x-ray.  No evidence  of metastatic involvement.   Electronically Signed   By: Ivar Drape M.D.   On: 12/28/2014 10:16   Ct Chest W Contrast  12/28/2014   CLINICAL DATA:  Breast cancer with lung metastases. Chemotherapy and radiation therapy complete.  EXAM: CT CHEST WITH CONTRAST  TECHNIQUE: Multidetector CT imaging of the chest was performed during intravenous contrast administration.  CONTRAST:  22m OMNIPAQUE IOHEXOL 300 MG/ML  SOLN  COMPARISON:  06/03/2014.  FINDINGS: There are collateral vessels in the low left neck and chest, which may be due to narrowing of the left brachiocephalic vein. No pathologically enlarged mediastinal, hilar, axillary or internal mammary lymph nodes. Surgical clips in the right axilla with postoperative changes of right mastectomy. Heart size normal. No pericardial effusion.  Subpleural patchy opacification in the apical segment right upper lobe is unchanged and likely treatment related. Mild subpleural radiation fibrosis in the anterior right hemi thorax. Scattered pulmonary parenchymal scarring in the right lung with ground-glass nodularity, measuring up to approximately 1.8 cm in the right lower lobe (series 5, image 33), stable. Mild scattered nodularity and subpleural ground-glass in the left upper lobe are also stable. No pleural fluid. Airway is unremarkable.  Incidental imaging of the upper abdomen shows a low-attenuation lesion in the inferior right hepatic lobe measuring 1.9 cm, stable. Visualized portions of the liver, gallbladder, adrenal glands, kidneys, spleen, pancreas, stomach and bowel are otherwise grossly unremarkable. Varices or splenorenal shunt in the left upper quadrant, adjacent to the spleen and stomach, unchanged.  L1 compression fracture is unchanged.  IMPRESSION: 1. No evidence of metastatic disease. 2. Scattered pulmonary parenchymal scarring. 3. Suspected narrowing of the left brachiocephalic vein, with collateral flow in the left neck and chest.   Electronically Signed    By: MLorin PicketM.D.   On: 12/28/2014 11:30      ASSESSMENT: 59y.o. [place] woman presenting June 2011 with stage IV breast cancer involving the right breast, right axilla, mediastinal lymph nodes, and both lungs, but not the brain, liver, or bones  (1) positive right breast and right axillary lymph node biopsies 07/17/2010 of a clinical T4 N2 M1 invasive ductal carcinoma, grade 1, estrogen receptor  100% positive, progesterone receptor 6% positive, with an MIB-1 of 12%, and no HER-2 amplification.  (2) participated in Phase II tessetaxel study, receiving 2 cycles complicated by thrombocytopenia, anemia requiring transfusion, transaminase elevation, and afebrile neutropenia. Off-study as of September 2011. Chest CT scan September 2011 did show evidence of response.  (3) on letrozole as of October 2011, with continuing response and good tolerance   (4) 06/02/2012 underwent right mastectomy and axillary lymph node sampling (3 lymph nodes removed, all with viable cancer as well as evidence of treatment effect) for a ypT2 ypN1-2 invasive ductal carcinoma, grade 1, 98% estrogen receptor positive, progesterone receptor negative, with no HER-2 amplification  (5) left jugular vein DVT documented 06/16/2012; left sided Port-A-Cath removed mid July 2015; Coumadin stopped 09/07/2014.  (a) Left brachiocephalic v. slightly narrowed, with some Left lower neck collateralization noted on chest CT December 2015  (6) osteoporosis, on alendronate (most recent bone density in September 2014 showed a T score of -2.8)   (7) chronic moderate thrombocytopenia  (8) mild persistent leukopenia   PLAN:  Nicole [name] currently has no measurable disease from her stage IV breast cancer. This is very favorable. She is tolerating the letrozole and alendronate well, and obtained his medications at a good cost. Accordingly we are continuing as before. She will see [place] again in April with a chest x-ray and in October, after  her left mammography, with a CT scan of the chest.  I have again urged her to start an exercise program and gave her a copy of the [employer].  She needs her health maintenance updated. She does not have a primary care physician at present. She lost her gynecologist when Dr. Maretta Bees moved out of town. Her husband Nicole [name] sees Laurence Spates in the [place] [facility] group so I am referring Nicole [name] therefore colonoscopy. I am obtaining a lipid panel with her April visit. She will need a Pap smear sometime this year.  Ijanae has a good understanding of the overall plan. She agrees with it. She knows the goal of treatment in her case is control. She will call with any problems that may develop before her next visit here  )Jaquasha Carnevale C    01/10/2015

## 2015-01-11 ENCOUNTER — Telehealth: Payer: Self-pay | Admitting: Oncology

## 2015-01-11 NOTE — Telephone Encounter (Signed)
, °

## 2015-01-31 ENCOUNTER — Telehealth: Payer: Self-pay | Admitting: Oncology

## 2015-01-31 NOTE — Telephone Encounter (Signed)
cld & spoke to pt and gave pt r/s appt for [facility] due to [facility]-pt underrstood

## 2015-03-08 ENCOUNTER — Other Ambulatory Visit: Payer: Self-pay | Admitting: Oncology

## 2015-04-04 ENCOUNTER — Other Ambulatory Visit (HOSPITAL_BASED_OUTPATIENT_CLINIC_OR_DEPARTMENT_OTHER): Payer: BLUE CROSS/BLUE SHIELD

## 2015-04-04 ENCOUNTER — Other Ambulatory Visit: Payer: Self-pay | Admitting: Oncology

## 2015-04-04 DIAGNOSIS — C50411 Malignant neoplasm of upper-outer quadrant of right female breast: Secondary | ICD-10-CM | POA: Diagnosis not present

## 2015-04-04 DIAGNOSIS — I890 Lymphedema, not elsewhere classified: Secondary | ICD-10-CM

## 2015-04-04 DIAGNOSIS — I82409 Acute embolism and thrombosis of unspecified deep veins of unspecified lower extremity: Secondary | ICD-10-CM

## 2015-04-04 DIAGNOSIS — C7800 Secondary malignant neoplasm of unspecified lung: Secondary | ICD-10-CM

## 2015-04-04 DIAGNOSIS — D696 Thrombocytopenia, unspecified: Secondary | ICD-10-CM | POA: Diagnosis not present

## 2015-04-04 DIAGNOSIS — C50911 Malignant neoplasm of unspecified site of right female breast: Secondary | ICD-10-CM

## 2015-04-04 DIAGNOSIS — D72819 Decreased white blood cell count, unspecified: Secondary | ICD-10-CM

## 2015-04-04 DIAGNOSIS — C50919 Malignant neoplasm of unspecified site of unspecified female breast: Secondary | ICD-10-CM

## 2015-04-04 LAB — CBC WITH DIFFERENTIAL/PLATELET
BASO%: 0.3 % (ref 0.0–2.0)
BASOS ABS: 0 10*3/uL (ref 0.0–0.1)
EOS%: 0.3 % (ref 0.0–7.0)
Eosinophils Absolute: 0 10*3/uL (ref 0.0–0.5)
HCT: 41.3 % (ref 34.8–46.6)
HGB: 14.1 g/dL (ref 11.6–15.9)
LYMPH#: 0.7 10*3/uL — AB (ref 0.9–3.3)
LYMPH%: 22.8 % (ref 14.0–49.7)
MCH: 29.2 pg (ref 25.1–34.0)
MCHC: 34.1 g/dL (ref 31.5–36.0)
MCV: 85.5 fL (ref 79.5–101.0)
MONO#: 0.4 10*3/uL (ref 0.1–0.9)
MONO%: 10.8 % (ref 0.0–14.0)
NEUT%: 65.8 % (ref 38.4–76.8)
NEUTROS ABS: 2.1 10*3/uL (ref 1.5–6.5)
Platelets: 81 10*3/uL — ABNORMAL LOW (ref 145–400)
RBC: 4.83 10*6/uL (ref 3.70–5.45)
RDW: 14.8 % — ABNORMAL HIGH (ref 11.2–14.5)
WBC: 3.3 10*3/uL — ABNORMAL LOW (ref 3.9–10.3)

## 2015-04-04 LAB — COMPREHENSIVE METABOLIC PANEL (CC13)
ALT: 22 U/L (ref 0–55)
AST: 20 U/L (ref 5–34)
Albumin: 3.9 g/dL (ref 3.5–5.0)
Alkaline Phosphatase: 71 U/L (ref 40–150)
Anion Gap: 9 mEq/L (ref 3–11)
BILIRUBIN TOTAL: 0.94 mg/dL (ref 0.20–1.20)
BUN: 13.6 mg/dL (ref 7.0–26.0)
CALCIUM: 8.9 mg/dL (ref 8.4–10.4)
CHLORIDE: 110 meq/L — AB (ref 98–109)
CO2: 24 mEq/L (ref 22–29)
CREATININE: 1 mg/dL (ref 0.6–1.1)
EGFR: 60 mL/min/{1.73_m2} — AB (ref >90)
GLUCOSE: 98 mg/dL (ref 70–140)
Potassium: 4.1 mEq/L (ref 3.5–5.1)
Sodium: 143 mEq/L (ref 136–145)
Total Protein: 6.6 g/dL (ref 6.4–8.3)

## 2015-04-05 LAB — LIPID PANEL
CHOL/HDL RATIO: 1.9 ratio
CHOLESTEROL: 164 mg/dL (ref 0–200)
HDL: 86 mg/dL (ref >=46)
LDL Cholesterol: 65 mg/dL (ref 0–99)
Triglycerides: 65 mg/dL (ref <150)
VLDL: 13 mg/dL (ref 0–40)

## 2015-04-11 ENCOUNTER — Other Ambulatory Visit: Payer: Self-pay | Admitting: *Deleted

## 2015-04-11 ENCOUNTER — Ambulatory Visit (HOSPITAL_BASED_OUTPATIENT_CLINIC_OR_DEPARTMENT_OTHER): Payer: BLUE CROSS/BLUE SHIELD | Admitting: Nurse Practitioner

## 2015-04-11 ENCOUNTER — Telehealth: Payer: Self-pay | Admitting: Oncology

## 2015-04-11 ENCOUNTER — Encounter: Payer: Self-pay | Admitting: Nurse Practitioner

## 2015-04-11 ENCOUNTER — Other Ambulatory Visit: Payer: Self-pay | Admitting: Nurse Practitioner

## 2015-04-11 VITALS — BP 153/73 | HR 91 | Temp 98.1°F | Resp 18 | Ht 64.0 in | Wt 144.3 lb

## 2015-04-11 DIAGNOSIS — C7801 Secondary malignant neoplasm of right lung: Secondary | ICD-10-CM

## 2015-04-11 DIAGNOSIS — C50911 Malignant neoplasm of unspecified site of right female breast: Secondary | ICD-10-CM

## 2015-04-11 DIAGNOSIS — C773 Secondary and unspecified malignant neoplasm of axilla and upper limb lymph nodes: Secondary | ICD-10-CM | POA: Diagnosis not present

## 2015-04-11 DIAGNOSIS — M8580 Other specified disorders of bone density and structure, unspecified site: Secondary | ICD-10-CM

## 2015-04-11 DIAGNOSIS — C50411 Malignant neoplasm of upper-outer quadrant of right female breast: Secondary | ICD-10-CM | POA: Diagnosis not present

## 2015-04-11 DIAGNOSIS — Z1231 Encounter for screening mammogram for malignant neoplasm of breast: Secondary | ICD-10-CM

## 2015-04-11 DIAGNOSIS — C7800 Secondary malignant neoplasm of unspecified lung: Principal | ICD-10-CM

## 2015-04-11 MED ORDER — LETROZOLE 2.5 MG PO TABS: 2.5000 mg | ORAL_TABLET | Freq: Every day | ORAL | Status: DC

## 2015-04-11 NOTE — Telephone Encounter (Signed)
Appointments made and avs printed for patient °

## 2015-04-11 NOTE — Progress Notes (Signed)
ID: Nicole [name]   DOB: 1956/07/20  MR#: [id number]  ERD#:[id number]  PCP:  GYN:  SURolm Bookbinder, MD OTHER MD: Gery Pray, MD  CHIEF COMPLAINT:  Metastatic Breast Cancer  CURRENT TREATMENT: Letrozole, alendronate  BREAST CANCER HISTORY: From Dr. Collier Salina [name]'s 07/25/2010 note:  "This woman has not had any significant medical intervention for some time.  Her last mammogram was about seven years ago.  She noted a right breast mass about a year ago and did not seek immediate medical attention for this.  She ultimately, after some time, admitted this to her husband and self-referred herself for intervention.  She had a mammogram on 07/17/2010 with an ultrasound of the right breast.  This showed a lobulated mass upper right outer quadrant measuring at least 6.3 x 7.3 cm and large right axillary lymph nodes were also noted.  There was skin thickening overlying the mass.  On physical exam, this mass was about an 8 cm with skin dimpling noted. Discolored area in the skin over the mass, fullness in the axilla.  Biopsy was recommended, which took place on 07/17/2010.  Pathology showed invasive ductal cancer involving both lymph node and breast.  The HER-2 was not amplified.  ER and PR both positive at 100% and 6% respectively.  Proliferative index was 12% involving both the lymph node and breast mass.  An MRI scan of both breasts was performed on 07/22/2010, essentially which showed a large heterogeneously enhancing mass of the right breast with washout kinetics measuring 6.4 x 4.4 x 5.0 cm.  Numerous satellite nodules were seen throughout the dominant mass.  There were several suspicious nodules in the upper central and upper inner quadrant of the right breast.  There were two enhancing subcentimeter nodules seen in the right pectoralis muscle.  No obvious chest wall nodules were seen; however, there was seen some metastatic adenopathy in the mediastinum as well as hila with a 2.4 x 2.0 cm mediastinal  lymph node as well as 1.2 x 3.0 cm subcarinal lymph node.  There was a right hilar and probable left hilar adenopathy.  Bilateral pulmonary nodules were seen and a right T2 lesion was seen anterior right liver as well."  Her subsequent history is as detailed below.  INTERVAL HISTORY: Nicole [name] returns today for followup of her stage IV breast cancer, accompanied by her husband Nicole [name]. The interval history is generally unremarkable. She is on letrozole daily and alendronate weekly. She tolerates both drugs well with absolutely no complaints. Specifically she denies hot flashes, vaginal changes, or reflux symptoms. She does have some joint aches to her hands, but she believes this to be unrelated. The interval history is significant for a negative coloscopy. She is still due for a pap smear this year.   REVIEW OF SYSTEMS:  Nicole [name] denies fevers, chills, nausea, vomiting, or changes in bowel or bladder habits. She has no shortness of breath, chest pain, cough, or palpitations. She denies headaches, dizziness, vision changes, or weakness. She still hasn't started exercising yet but knows she should. A detailed review of systems is otherwise stable.  PAST MEDICAL HISTORY: Past Medical History  Diagnosis Date  . IBD (inflammatory bowel disease)     resolved  . Heart murmur     mild, no cardiologist  . Blood transfusion 2012  . Radiation 07/21/12-09/07/12    5940 cGy  . Breast Nicole     breast Nicole dx 06/2010, stage 4, right     PAST SURGICAL HISTORY: Past Surgical History  Procedure Laterality Date  . Portacath placement  2011    left side  . Dilation and curettage of uterus  2004?  [name] Mastectomy  06/02/12    right breast  . Port-a-cath removal      FAMILY HISTORY Family History  Problem Relation Age of Onset  . Cancer Neg Hx    patient's father died at the age of 43 from heart disease. Patient's mother died at the age of 79 status post CABG. The patient had no brothers, 2 sisters. There is no history  of breast or ovarian cancer in the family.  GYNECOLOGIC HISTORY:  (Updated 03/14/2014)  Menarche age 59, she is GX P0. She does not recall when she went through menopause. She never took hormone replacement.  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 Husband Nicole [name] is retired from [employer]. They have no children and no pets. They attend a local [place].   ADVANCED DIRECTIVES: Not in place  HEALTH MAINTENANCE: (updated 03/14/2014)  History  Substance Use Topics  . Smoking status: Never Smoker   . Smokeless tobacco: Never Used  . Alcohol Use: No     Colonoscopy: Never  PAP: Remote  Bone density: 09/27/2013, osteoporosis with a T score of -2.8  Lipid panel: Not on file   No Known Allergies  Current Outpatient Prescriptions  Medication Sig Dispense Refill  . alendronate (FOSAMAX) 70 MG tablet Take 1 tablet (70 mg total) by mouth once a week. Take with a full glass of water on an empty stomach. 12 tablet 1  . Calcium Carbonate (CALTRATE 600 PO) Take by mouth 2 (two) times daily.     . Cholecalciferol (VITAMIN D PO) Take 2,000 Units by mouth once.     [name] letrozole (FEMARA) 2.5 MG tablet Take 1 tablet (2.5 mg total) by mouth daily. 90 tablet 1  . venlafaxine XR (EFFEXOR-XR) 75 MG 24 hr capsule TAKE ONE CAPSULE BY MOUTH ONCE DAILY 90 capsule 3  . LATISSE 0.03 % ophthalmic solution See admin instructions.  12   No current facility-administered medications for this visit.    OBJECTIVE: Middle-aged white woman in no acute distress Filed Vitals:   04/11/15 0954  BP: 153/73  Pulse: 91  Temp: 98.1 F (36.7 C)  Resp: 18     Body mass index is 24.76 kg/(m^2).    ECOG FS: 0 Filed Weights   04/11/15 0954  Weight: 144 lb 5 oz (65.46 kg)   Skin: warm, dry  HEENT: sclerae anicteric, conjunctivae pink, oropharynx clear. No thrush or mucositis.  Lymph Nodes: No cervical or supraclavicular lymphadenopathy   Lungs: clear to auscultation bilaterally, no rales, wheezes, or rhonci  Heart: regular rate and rhythm  Abdomen: round, soft, non tender, positive bowel sounds  Musculoskeletal: No focal spinal tenderness, no peripheral edema  Neuro: non focal, well oriented, positive affect  Breasts: right breast status post mastectomy and radiation. Telangiectasias present throughout right chest wall. No evidence of recurrent disease. Right axilla benign. Left breast unremarkable.  LAB RESULTS: Lab Results  Component Value Date   WBC 3.3* 04/04/2015   NEUTROABS 2.1 04/04/2015   HGB 14.1 04/04/2015   HCT 41.3 04/04/2015   MCV 85.5 04/04/2015   PLT 81* 04/04/2015      Chemistry      Component Value Date/Time   NA 143 04/04/2015 0917   NA 140 08/21/2012 1307   K 4.1 04/04/2015 0917  K 3.9 08/21/2012 1307   CL 109* 04/20/2013 0859   CL 105 08/21/2012 1307   CO2 24 04/04/2015 0917   CO2 24 08/21/2012 1307   BUN 13.6 04/04/2015 0917   BUN 15 08/21/2012 1307   CREATININE 1.0 04/04/2015 0917   CREATININE 0.93 08/21/2012 1307      Component Value Date/Time   CALCIUM 8.9 04/04/2015 0917   CALCIUM 9.2 08/21/2012 1307   ALKPHOS 71 04/04/2015 0917   ALKPHOS 59 08/21/2012 1307   AST 20 04/04/2015 0917   AST 24 08/21/2012 1307   ALT 22 04/04/2015 0917   ALT 30 08/21/2012 1307   BILITOT 0.94 04/04/2015 0917   BILITOT 0.6 08/21/2012 1307        STUDIES: No results found.    ASSESSMENT: 59 [name].o. [place] woman presenting June 2011 with stage IV breast cancer involving the right breast, right axilla, mediastinal lymph nodes, and both lungs, but not the brain, liver, or bones  (1) positive right breast and right axillary lymph node biopsies 07/17/2010 of a clinical T4 N2 M1 invasive ductal carcinoma, grade 1, estrogen receptor 100% positive, progesterone receptor 6% positive, with an MIB-1 of 12%, and no HER-2 amplification.  (2) participated in Phase II tessetaxel study, receiving 2  cycles complicated by thrombocytopenia, anemia requiring transfusion, transaminase elevation, and afebrile neutropenia. Off-study as of September 2011. Chest CT scan September 2011 did show evidence of response.  (3) on letrozole as of October 2011, with continuing response and good tolerance   (4) 06/02/2012 underwent right mastectomy and axillary lymph node sampling (3 lymph nodes removed, all with viable cancer as well as evidence of treatment effect) for a ypT2 ypN1-2 invasive ductal carcinoma, grade 1, 98% estrogen receptor positive, progesterone receptor negative, with no HER-2 amplification  (5) left jugular vein DVT documented 06/16/2012; left sided Port-A-Cath removed mid July 2015; Coumadin stopped 09/07/2014.  (a) Left brachiocephalic v. slightly narrowed, with some Left lower neck collateralization noted on chest CT December 2015  (6) osteoporosis, on alendronate (most recent bone density in September 2014 showed a T score of -2.8)   (7) chronic moderate thrombocytopenia  (8) mild persistent leukopenia   PLAN:  Nicole [name] looks and feels well today. She is tolerating the letrozole well and has plans to continue this drug indefinitely until there is evidence of disease progression. Similarly she is tolerating the alendronate well with no complaints. The labs were reviewed in detail and were entirely stable. Her lipid panel was excellent.   Her next left mammogram and bone density scan will be due at the end of September. I have placed orders for these to be performed at this time. The nurse has provided her with information on where to obtain a pap smear.  Nicole [name] will return in October for labs and a follow up visit. Prior to this visit she will obtain a repeat chest CT scan. She understands and agrees with this plan. She knows the goal of treatment in her case is control. She has been encouraged to call with any issues that might arise before her next visit here.   Nicole [name] Nicole [name]     04/11/2015

## 2015-04-12 NOTE — Addendum Note (Signed)
Addended by: Marcelino Duster on: 04/12/2015 01:40 PM   Modules accepted: Orders

## 2015-05-31 ENCOUNTER — Other Ambulatory Visit: Payer: Self-pay | Admitting: Oncology

## 2015-05-31 NOTE — Telephone Encounter (Signed)
Last OV 04/11/15.  Next OV 11/01/15.  Chart reviewed.

## 2015-08-22 ENCOUNTER — Other Ambulatory Visit: Payer: Self-pay | Admitting: Oncology

## 2015-09-06 ENCOUNTER — Other Ambulatory Visit: Payer: Self-pay | Admitting: Oncology

## 2015-09-27 ENCOUNTER — Other Ambulatory Visit: Payer: Self-pay | Admitting: *Deleted

## 2015-09-27 DIAGNOSIS — C50411 Malignant neoplasm of upper-outer quadrant of right female breast: Secondary | ICD-10-CM

## 2015-09-28 ENCOUNTER — Telehealth: Payer: Self-pay | Admitting: Oncology

## 2015-09-28 NOTE — Telephone Encounter (Signed)
Called patient with lab appointment

## 2015-09-29 ENCOUNTER — Other Ambulatory Visit (HOSPITAL_BASED_OUTPATIENT_CLINIC_OR_DEPARTMENT_OTHER): Payer: BLUE CROSS/BLUE SHIELD

## 2015-09-29 DIAGNOSIS — C50411 Malignant neoplasm of upper-outer quadrant of right female breast: Secondary | ICD-10-CM

## 2015-09-29 LAB — CBC WITH DIFFERENTIAL/PLATELET
BASO%: 0.6 % (ref 0.0–2.0)
Basophils Absolute: 0 10*3/uL (ref 0.0–0.1)
EOS%: 0.7 % (ref 0.0–7.0)
Eosinophils Absolute: 0 10*3/uL (ref 0.0–0.5)
HEMATOCRIT: 42.2 % (ref 34.8–46.6)
HGB: 14.2 g/dL (ref 11.6–15.9)
LYMPH%: 14.4 % (ref 14.0–49.7)
MCH: 28.9 pg (ref 25.1–34.0)
MCHC: 33.7 g/dL (ref 31.5–36.0)
MCV: 85.8 fL (ref 79.5–101.0)
MONO#: 0.5 10*3/uL (ref 0.1–0.9)
MONO%: 8.4 % (ref 0.0–14.0)
NEUT#: 4.7 10*3/uL (ref 1.5–6.5)
NEUT%: 75.9 % (ref 38.4–76.8)
Platelets: 101 10*3/uL — ABNORMAL LOW (ref 145–400)
RBC: 4.92 10*6/uL (ref 3.70–5.45)
RDW: 14.3 % (ref 11.2–14.5)
WBC: 6.1 10*3/uL (ref 3.9–10.3)
lymph#: 0.9 10*3/uL (ref 0.9–3.3)

## 2015-09-29 LAB — COMPREHENSIVE METABOLIC PANEL (CC13)
ALT: 22 U/L (ref 0–55)
AST: 21 U/L (ref 5–34)
Albumin: 4 g/dL (ref 3.5–5.0)
Alkaline Phosphatase: 72 U/L (ref 40–150)
Anion Gap: 11 mEq/L (ref 3–11)
BUN: 13 mg/dL (ref 7.0–26.0)
CO2: 23 mEq/L (ref 22–29)
CREATININE: 1 mg/dL (ref 0.6–1.1)
Calcium: 9.1 mg/dL (ref 8.4–10.4)
Chloride: 109 mEq/L (ref 98–109)
EGFR: 62 mL/min/{1.73_m2} — ABNORMAL LOW (ref >90)
GLUCOSE: 88 mg/dL (ref 70–140)
Potassium: 3.8 mEq/L (ref 3.5–5.1)
SODIUM: 142 meq/L (ref 136–145)
Total Bilirubin: 0.85 mg/dL (ref 0.20–1.20)
Total Protein: 6.8 g/dL (ref 6.4–8.3)

## 2015-10-02 ENCOUNTER — Ambulatory Visit (HOSPITAL_COMMUNITY)
Admission: RE | Admit: 2015-10-02 | Discharge: 2015-10-02 | Disposition: A | Discharge status: Home / Self Care | Payer: BLUE CROSS/BLUE SHIELD | Source: Ambulatory Visit | Attending: Nurse Practitioner | Admitting: Nurse Practitioner

## 2015-10-02 ENCOUNTER — Encounter (HOSPITAL_COMMUNITY): Payer: Self-pay

## 2015-10-02 DIAGNOSIS — Z9011 Acquired absence of right breast and nipple: Secondary | ICD-10-CM | POA: Insufficient documentation

## 2015-10-02 DIAGNOSIS — I868 Varicose veins of other specified sites: Secondary | ICD-10-CM | POA: Diagnosis not present

## 2015-10-02 DIAGNOSIS — K769 Liver disease, unspecified: Secondary | ICD-10-CM | POA: Diagnosis not present

## 2015-10-02 DIAGNOSIS — Z9221 Personal history of antineoplastic chemotherapy: Secondary | ICD-10-CM | POA: Insufficient documentation

## 2015-10-02 DIAGNOSIS — C7800 Secondary malignant neoplasm of unspecified lung: Secondary | ICD-10-CM | POA: Insufficient documentation

## 2015-10-02 DIAGNOSIS — C50911 Malignant neoplasm of unspecified site of right female breast: Secondary | ICD-10-CM | POA: Diagnosis not present

## 2015-10-02 DIAGNOSIS — Z923 Personal history of irradiation: Secondary | ICD-10-CM | POA: Insufficient documentation

## 2015-10-02 DIAGNOSIS — M8448XA Pathological fracture, other site, initial encounter for fracture: Secondary | ICD-10-CM | POA: Diagnosis not present

## 2015-10-02 DIAGNOSIS — Z79899 Other long term (current) drug therapy: Secondary | ICD-10-CM | POA: Insufficient documentation

## 2015-10-02 MED ORDER — IOHEXOL 300 MG/ML  SOLN
75.0000 mL | Freq: Once | INTRAMUSCULAR | Status: AC | PRN
  Administered 2015-10-02: 75 mL via INTRAVENOUS

## 2015-10-09 ENCOUNTER — Ambulatory Visit
Admission: RE | Admit: 2015-10-09 | Discharge: 2015-10-09 | Disposition: A | Discharge status: Home / Self Care | Payer: BLUE CROSS/BLUE SHIELD | Source: Ambulatory Visit | Attending: Nurse Practitioner | Admitting: Nurse Practitioner

## 2015-10-09 ENCOUNTER — Other Ambulatory Visit: Payer: Self-pay | Admitting: Nurse Practitioner

## 2015-10-09 ENCOUNTER — Other Ambulatory Visit: Payer: BLUE CROSS/BLUE SHIELD

## 2015-10-09 DIAGNOSIS — Z1231 Encounter for screening mammogram for malignant neoplasm of breast: Secondary | ICD-10-CM

## 2015-10-10 ENCOUNTER — Other Ambulatory Visit: Payer: Self-pay | Admitting: Nurse Practitioner

## 2015-10-10 ENCOUNTER — Telehealth: Payer: Self-pay

## 2015-10-10 DIAGNOSIS — R928 Other abnormal and inconclusive findings on diagnostic imaging of breast: Secondary | ICD-10-CM

## 2015-10-10 NOTE — Telephone Encounter (Signed)
Patient's husband Merry Proud called regarding patient's mammo results.  Per Gentry Fitz, NP the mammo has some distortions on the films.  Radiology is recommending that patient have a diagnostic mammo and an U/S which will be more definitive but does not mean patient has any worries at this time.. Patient and her husband feel much better and will call to schedule the two other tests.  Patient's CT also showed no evidence of metastatic disease which was reported to the patient.

## 2015-10-12 ENCOUNTER — Other Ambulatory Visit: Payer: BLUE CROSS/BLUE SHIELD

## 2015-10-13 ENCOUNTER — Ambulatory Visit
Admission: RE | Admit: 2015-10-13 | Discharge: 2015-10-13 | Disposition: A | Discharge status: Home / Self Care | Payer: BLUE CROSS/BLUE SHIELD | Source: Ambulatory Visit | Attending: Nurse Practitioner | Admitting: Nurse Practitioner

## 2015-10-13 DIAGNOSIS — R928 Other abnormal and inconclusive findings on diagnostic imaging of breast: Secondary | ICD-10-CM

## 2015-10-18 ENCOUNTER — Other Ambulatory Visit: Payer: BLUE CROSS/BLUE SHIELD

## 2015-10-25 ENCOUNTER — Other Ambulatory Visit (HOSPITAL_BASED_OUTPATIENT_CLINIC_OR_DEPARTMENT_OTHER): Payer: BLUE CROSS/BLUE SHIELD

## 2015-10-25 ENCOUNTER — Ambulatory Visit: Payer: BLUE CROSS/BLUE SHIELD | Admitting: Oncology

## 2015-10-25 DIAGNOSIS — C7800 Secondary malignant neoplasm of unspecified lung: Principal | ICD-10-CM

## 2015-10-25 DIAGNOSIS — C50411 Malignant neoplasm of upper-outer quadrant of right female breast: Secondary | ICD-10-CM

## 2015-10-25 DIAGNOSIS — C50911 Malignant neoplasm of unspecified site of right female breast: Secondary | ICD-10-CM

## 2015-10-25 LAB — CBC WITH DIFFERENTIAL/PLATELET
BASO%: 0.5 % (ref 0.0–2.0)
Basophils Absolute: 0 10*3/uL (ref 0.0–0.1)
EOS%: 0.3 % (ref 0.0–7.0)
Eosinophils Absolute: 0 10*3/uL (ref 0.0–0.5)
HCT: 41.9 % (ref 34.8–46.6)
HEMOGLOBIN: 14.6 g/dL (ref 11.6–15.9)
LYMPH#: 0.7 10*3/uL — AB (ref 0.9–3.3)
LYMPH%: 18.3 % (ref 14.0–49.7)
MCH: 29.8 pg (ref 25.1–34.0)
MCHC: 34.8 g/dL (ref 31.5–36.0)
MCV: 85.5 fL (ref 79.5–101.0)
MONO#: 0.3 10*3/uL (ref 0.1–0.9)
MONO%: 8.5 % (ref 0.0–14.0)
NEUT%: 72.4 % (ref 38.4–76.8)
NEUTROS ABS: 2.7 10*3/uL (ref 1.5–6.5)
NRBC: 0 % (ref 0–0)
Platelets: 83 10*3/uL — ABNORMAL LOW (ref 145–400)
RBC: 4.9 10*6/uL (ref 3.70–5.45)
RDW: 14 % (ref 11.2–14.5)
WBC: 3.8 10*3/uL — AB (ref 3.9–10.3)

## 2015-10-25 LAB — COMPREHENSIVE METABOLIC PANEL (CC13)
ALBUMIN: 3.9 g/dL (ref 3.5–5.0)
ALT: 22 U/L (ref 0–55)
AST: 20 U/L (ref 5–34)
Alkaline Phosphatase: 67 U/L (ref 40–150)
Anion Gap: 8 mEq/L (ref 3–11)
BUN: 13.4 mg/dL (ref 7.0–26.0)
CO2: 21 meq/L — AB (ref 22–29)
Calcium: 9.2 mg/dL (ref 8.4–10.4)
Chloride: 113 mEq/L — ABNORMAL HIGH (ref 98–109)
Creatinine: 1 mg/dL (ref 0.6–1.1)
EGFR: 65 mL/min/{1.73_m2} — AB (ref >90)
GLUCOSE: 94 mg/dL (ref 70–140)
POTASSIUM: 3.9 meq/L (ref 3.5–5.1)
SODIUM: 143 meq/L (ref 136–145)
Total Bilirubin: 0.87 mg/dL (ref 0.20–1.20)
Total Protein: 6.7 g/dL (ref 6.4–8.3)

## 2015-10-31 NOTE — Progress Notes (Signed)
ID: Nicole [name]   DOB: 03/19/1956  MR#: [id number]  HDQ#:[id number]  PCP:  GYN:  SURolm Bookbinder, MD OTHER MD: Gery Pray, MD  CHIEF COMPLAINT:  Metastatic Breast Cancer  CURRENT TREATMENT: Letrozole, alendronate  BREAST CANCER HISTORY: From Dr. Collier Salina Rubin's 07/25/2010 note:  "This woman has not had any significant medical intervention for some time.  Her last mammogram was about seven years ago.  She noted a right breast mass about a year ago and did not seek immediate medical attention for this.  She ultimately, after some time, admitted this to her husband and self-referred herself for intervention.  She had a mammogram on 07/17/2010 with an ultrasound of the right breast.  This showed a lobulated mass upper right outer quadrant measuring at least 6.3 x 7.3 cm and large right axillary lymph nodes were also noted.  There was skin thickening overlying the mass.  On physical exam, this mass was about an 8 cm with skin dimpling noted. Discolored area in the skin over the mass, fullness in the axilla.  Biopsy was recommended, which took place on 07/17/2010.  Pathology showed invasive ductal cancer involving both lymph node and breast.  The HER-2 was not amplified.  ER and PR both positive at 100% and 6% respectively.  Proliferative index was 12% involving both the lymph node and breast mass.  An MRI scan of both breasts was performed on 07/22/2010, essentially which showed a large heterogeneously enhancing mass of the right breast with washout kinetics measuring 6.4 x 4.4 x 5.0 cm.  Numerous satellite nodules were seen throughout the dominant mass.  There were several suspicious nodules in the upper central and upper inner quadrant of the right breast.  There were two enhancing subcentimeter nodules seen in the right pectoralis muscle.  No obvious chest wall nodules were seen; however, there was seen some metastatic adenopathy in the mediastinum as well as hila with a 2.4 x 2.0 cm mediastinal  lymph node as well as 1.2 x 3.0 cm subcarinal lymph node.  There was a right hilar and probable left hilar adenopathy.  Bilateral pulmonary nodules were seen and a right T2 lesion was seen anterior right liver as well."  Her subsequent history is as detailed below.  INTERVAL HISTORY: Nicole [name] returns today for followup of her estrogen receptor positive breast cancer, accompanied by her husband [name].she continues on letrozole, which she tolerates well. Hot flashes, night sweats, vaginal dryness, and arthralgias or myalgias are not major issues for her. She obtains a drug in approximately $3 per month.  Interval history is significant for her having retired earlier this week. Also we have just restage her with a CT scan of the chest which is completely negative.   REVIEW OF SYSTEMS:  Kathygets occasional leg cramps, particularly at night. She needs to have her eyes examined but there have been no sudden visual changes. Just some seasonal allergies and other sinus related issues which are not major. She complains of depression although this is not apparent during the visit. She is not exercising regularly.A detailed review of systems today is otherwise unremarkable  PAST MEDICAL HISTORY: Past Medical History  Diagnosis Date  . IBD (inflammatory bowel disease)     resolved  . Heart murmur     mild, no cardiologist  . Blood transfusion 2012  . Radiation 07/21/12-09/07/12    5940 cGy  . Breast CA ([place])     breast ca dx 06/2010, stage 4, right     PAST  SURGICAL HISTORY: Past Surgical History  Procedure Laterality Date  . Portacath placement  2011    left side  . Dilation and curettage of uterus  2004?  [name] Mastectomy  06/02/12    right breast  . Port-a-cath removal      FAMILY HISTORY Family History  Problem Relation Age of Onset  . Cancer Neg Hx    patient's father died at the age of 67 from heart disease. Patient's mother died at the age of 61 status post CABG. The patient had no brothers, 2  sisters. There is no history of breast or ovarian cancer in the family.  GYNECOLOGIC HISTORY:  (Updated 03/14/2014)  Menarche age 32, she is GX P0. She does not recall when she went through menopause. She never took hormone replacement.  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 Husband Merry Proud is retired from [employer]. They have no children and no pets. They attend a local [place].   ADVANCED DIRECTIVES: Not in place  HEALTH MAINTENANCE: (updated 03/14/2014)  Social History  Substance Use Topics  . Smoking status: Never Smoker   . Smokeless tobacco: Never Used  . Alcohol Use: No     Colonoscopy: Never  PAP: Remote  Bone density: 09/27/2013, osteoporosis with a T score of -2.8  Lipid panel: Not on file   No Known Allergies  Current Outpatient Prescriptions  Medication Sig Dispense Refill  . alendronate (FOSAMAX) 70 MG tablet TAKE 1 TABLET BY MOUTH ONCE A WEEK 12 tablet 1  . Calcium Carbonate (CALTRATE 600 PO) Take by mouth 2 (two) times daily.     . Cholecalciferol (VITAMIN D PO) Take 2,000 Units by mouth once.     [name] letrozole (FEMARA) 2.5 MG tablet Take 1 tablet (2.5 mg total) by mouth daily. 90 tablet 2  . venlafaxine XR (EFFEXOR-XR) 75 MG 24 hr capsule Take 1 capsule (75 mg total) by mouth daily. 90 capsule 3   No current facility-administered medications for this visit.    OBJECTIVE: Middle-aged white womanwho appears well Filed Vitals:   11/01/15 1434  BP: 150/79  Pulse: 97  Temp: 98 F (36.7 C)  Resp: 18     Body mass index is 25.2 kg/(m^2).    ECOG FS: 0 Filed Weights   11/01/15 1434  Weight: 146 lb 14.4 oz (66.633 kg)   Sclerae unicteric, pupils round and equal Oropharynx clear and moist-- no thrush or other lesions No cervical or supraclavicular adenopathy Lungs no rales or rhonchi Heart regular rate and rhythm Abd soft, nontender, positive bowel sounds MSK no focal  spinal tenderness, no upper extremity lymphedema Neuro: nonfocal, well oriented, appropriate affect Breasts: the right breast is status post mastectomy and radiation. There is no evidence of chest wall recurrence. There are telangiectasias over the port area, unchanged. The right axilla is benign. Left breast is unremarkable.    LAB RESULTS: Lab Results  Component Value Date   WBC 3.8* 10/25/2015   NEUTROABS 2.7 10/25/2015   HGB 14.6 10/25/2015   HCT 41.9 10/25/2015   MCV 85.5 10/25/2015   PLT 83* 10/25/2015      Chemistry      Component Value Date/Time   NA 143 10/25/2015 0922   NA 140 08/21/2012 1307   K 3.9 10/25/2015 0922   K 3.9 08/21/2012 1307   CL 109* 04/20/2013 0859   CL 105 08/21/2012 1307   CO2 21* 10/25/2015 3785  CO2 24 08/21/2012 1307   BUN 13.4 10/25/2015 0922   BUN 15 08/21/2012 1307   CREATININE 1.0 10/25/2015 0922   CREATININE 0.93 08/21/2012 1307      Component Value Date/Time   CALCIUM 9.2 10/25/2015 0922   CALCIUM 9.2 08/21/2012 1307   ALKPHOS 67 10/25/2015 0922   ALKPHOS 59 08/21/2012 1307   AST 20 10/25/2015 0922   AST 24 08/21/2012 1307   ALT 22 10/25/2015 0922   ALT 30 08/21/2012 1307   BILITOT 0.87 10/25/2015 0922   BILITOT 0.6 08/21/2012 1307        STUDIES: US Breast Ltd Uni Left Inc Axilla  10/13/2015  CLINICAL DATA:  59 year old female with possible left breast distortion seen on the MLO view only. EXAM: DIGITAL DIAGNOSTIC LEFT MAMMOGRAM WITH 3D TOMOSYNTHESIS AND CAD LEFT BREAST ULTRASOUND COMPARISON:  Previous exam(s). ACR Breast Density Category c: The breast tissue is heterogeneously dense, which may obscure small masses. FINDINGS: Spot compression MLO tomosynthesis as well as mL tomosynthesis was performed of the left breast. The initially questioned possible left breast distortion resolves on the additional imaging with findings compatible with overlapping tissue, although extremely dense fibroglandular tissue in this location  could obscure mass. Mammographic images were processed with CAD. Physical examination of the upper and central left breast is not reveal any palpable masses. Targeted ultrasound of the left breast was performed. No suspicious masses or abnormalities are seen, only extremely dense fibroglandular tissue is visualized. IMPRESSION: Initially questioned left breast distortion resolves on the additional imaging with findings compatible with overlapping tissue. There is no mammographic evidence of malignancy in the left breast. RECOMMENDATION: Screening mammogram in one year.(Code:SM-B-01Y) I have discussed the findings and recommendations with the patient. Results were also provided in writing at the conclusion of the visit. If applicable, a reminder letter will be sent to the patient regarding the next appointment. BI-RADS CATEGORY  1: Negative. Electronically Signed   By: Everlean Alstrom M.D.   On: 10/13/2015 10:57   Mm Diag Breast Tomo Uni Left  10/13/2015  CLINICAL DATA:  59 year old female with possible left breast distortion seen on the MLO view only. EXAM: DIGITAL DIAGNOSTIC LEFT MAMMOGRAM WITH 3D TOMOSYNTHESIS AND CAD LEFT BREAST ULTRASOUND COMPARISON:  Previous exam(s). ACR Breast Density Category c: The breast tissue is heterogeneously dense, which may obscure small masses. FINDINGS: Spot compression MLO tomosynthesis as well as mL tomosynthesis was performed of the left breast. The initially questioned possible left breast distortion resolves on the additional imaging with findings compatible with overlapping tissue, although extremely dense fibroglandular tissue in this location could obscure mass. Mammographic images were processed with CAD. Physical examination of the upper and central left breast is not reveal any palpable masses. Targeted ultrasound of the left breast was performed. No suspicious masses or abnormalities are seen, only extremely dense fibroglandular tissue is visualized. IMPRESSION:  Initially questioned left breast distortion resolves on the additional imaging with findings compatible with overlapping tissue. There is no mammographic evidence of malignancy in the left breast. RECOMMENDATION: Screening mammogram in one year.(Code:SM-B-01Y) I have discussed the findings and recommendations with the patient. Results were also provided in writing at the conclusion of the visit. If applicable, a reminder letter will be sent to the patient regarding the next appointment. BI-RADS CATEGORY  1: Negative. Electronically Signed   By: Everlean Alstrom M.D.   On: 10/13/2015 10:57   Mm Screening Breast Tomo Uni L  10/09/2015  CLINICAL DATA:  Screening. EXAM: DIGITAL SCREENING UNILATERAL  LEFT MAMMOGRAM WITH TOMO AND CAD COMPARISON:  Previous exam(s). ACR Breast Density Category c: The breast tissue is heterogeneously dense, which may obscure small masses. FINDINGS: In the left breast, possible distortion warrants further evaluation. Images were processed with CAD. IMPRESSION: Further evaluation is suggested for possible distortion in the left breast. RECOMMENDATION: Diagnostic mammogram and possibly ultrasound of the left breast. (Code:[id number]) The patient will be contacted regarding the findings, and additional imaging will be scheduled. BI-RADS CATEGORY  0: Incomplete. Need additional imaging evaluation and/or prior mammograms for comparison. Electronically Signed   By: Pamelia Hoit M.D.   On: 10/09/2015 14:12      ASSESSMENT: 59 y.o. [place] woman presenting June 2011 with stage IV breast cancer involving the right breast, right axilla, mediastinal lymph nodes, and both lungs, but not the brain, liver, or bones  (1) positive right breast and right axillary lymph node biopsies 07/17/2010 of a clinical T4 N2 M1 invasive ductal carcinoma, grade 1, estrogen receptor 100% positive, progesterone receptor 6% positive, with an MIB-1 of 12%, and no HER-2 amplification.  (2) participated in Phase II  tessetaxel study, receiving 2 cycles complicated by thrombocytopenia, anemia requiring transfusion, transaminase elevation, and afebrile neutropenia. Off-study as of September 2011. Chest CT scan September 2011 did show evidence of response.  (3) on letrozole as of October 2011, with continuing response and good tolerance   (4) 06/02/2012 underwent right mastectomy and axillary lymph node sampling (3 lymph nodes removed, all with viable cancer as well as evidence of treatment effect) for a ypT2 ypN1-2 invasive ductal carcinoma, grade 1, 98% estrogen receptor positive, progesterone receptor negative, with no HER-2 amplification  (5) left jugular vein DVT documented 06/16/2012; left sided Port-A-Cath removed mid July 2015; Coumadin stopped 09/07/2014.  (a) Left brachiocephalic v. slightly narrowed, with some Left lower neck collateralization noted on chest CT December 2015  (6) osteoporosis, on alendronate (most recent bone density in September 2014 showed a T score of -2.8)   (7) chronic moderate thrombocytopenia  (8) mild persistent leukopenia   PLAN:  Anvitha is now more than 3 years out from her definitive surgery, with no evidence of disease recurrence and in particular ALT no evidence of metastatic disease activity. This is very favorable.  She is tolerating the letrozole well. Of course we now have Palbociclib that we could add. However in cases like her I am not sure it has much to add. It certainly would add some implications on side effects. The plan accordingly he is to continue letrozole indefinitely, until there is evidence of disease progression.  She is on alendronate weekly for the osteoporosis concern per she was supposed to have had a repeat bone density but this was postponed because they are obtaining a new machine. This should be available in the next couple of weeks and when we get those results we can make sure that she is at least having stable density is not improved.  I  gave her a copy of the [employer] pamphlet and strongly encouraged her to participate not only in that protocol but also aim for a total exercise time of 40 12:55 hour 5 days a week.  Otherwise she will return to see [place] again in 6 months. We will obtain a chest x-ray and labs before that visit. She will then see me again a year from now, and before the year visit we will repeat a chest CT scan.  She knows to call for any problems that may develop before her next  visit here.  MAGRINAT,GUSTAV C    11/01/2015

## 2015-11-01 ENCOUNTER — Ambulatory Visit (HOSPITAL_BASED_OUTPATIENT_CLINIC_OR_DEPARTMENT_OTHER): Payer: BLUE CROSS/BLUE SHIELD | Admitting: Oncology

## 2015-11-01 ENCOUNTER — Telehealth: Payer: Self-pay | Admitting: Oncology

## 2015-11-01 VITALS — BP 150/79 | HR 97 | Temp 98.0°F | Resp 18 | Ht 64.0 in | Wt 146.9 lb

## 2015-11-01 DIAGNOSIS — C773 Secondary and unspecified malignant neoplasm of axilla and upper limb lymph nodes: Secondary | ICD-10-CM

## 2015-11-01 DIAGNOSIS — D696 Thrombocytopenia, unspecified: Secondary | ICD-10-CM

## 2015-11-01 DIAGNOSIS — M810 Age-related osteoporosis without current pathological fracture: Secondary | ICD-10-CM | POA: Diagnosis not present

## 2015-11-01 DIAGNOSIS — Z23 Encounter for immunization: Secondary | ICD-10-CM

## 2015-11-01 DIAGNOSIS — C50411 Malignant neoplasm of upper-outer quadrant of right female breast: Secondary | ICD-10-CM | POA: Diagnosis not present

## 2015-11-01 DIAGNOSIS — C7801 Secondary malignant neoplasm of right lung: Secondary | ICD-10-CM | POA: Diagnosis not present

## 2015-11-01 DIAGNOSIS — C7800 Secondary malignant neoplasm of unspecified lung: Secondary | ICD-10-CM

## 2015-11-01 DIAGNOSIS — C50911 Malignant neoplasm of unspecified site of right female breast: Secondary | ICD-10-CM

## 2015-11-01 MED ORDER — LETROZOLE 2.5 MG PO TABS: 2.5000 mg | ORAL_TABLET | Freq: Every day | ORAL | Status: DC

## 2015-11-01 MED ORDER — VENLAFAXINE HCL ER 75 MG PO CP24: 75.0000 mg | ORAL_CAPSULE | Freq: Every day | ORAL | Status: DC

## 2015-11-01 MED ORDER — INFLUENZA VAC SPLIT QUAD 0.5 ML IM SUSY
0.5000 mL | PREFILLED_SYRINGE | Freq: Once | INTRAMUSCULAR | Status: AC
  Administered 2015-11-01: 0.5 mL via INTRAMUSCULAR
  Filled 2015-11-01: qty 0.5

## 2015-11-01 NOTE — Telephone Encounter (Signed)
Appointments made and avs printed for patient °

## 2015-11-10 ENCOUNTER — Ambulatory Visit
Admission: RE | Admit: 2015-11-10 | Discharge: 2015-11-10 | Disposition: A | Discharge status: Home / Self Care | Payer: BLUE CROSS/BLUE SHIELD | Source: Ambulatory Visit | Attending: Nurse Practitioner | Admitting: Nurse Practitioner

## 2015-11-10 DIAGNOSIS — M8580 Other specified disorders of bone density and structure, unspecified site: Secondary | ICD-10-CM

## 2016-04-10 ENCOUNTER — Other Ambulatory Visit: Payer: Self-pay | Admitting: Nurse Practitioner

## 2016-04-10 ENCOUNTER — Telehealth: Payer: Self-pay | Admitting: *Deleted

## 2016-04-10 DIAGNOSIS — C50411 Malignant neoplasm of upper-outer quadrant of right female breast: Secondary | ICD-10-CM

## 2016-04-10 NOTE — Telephone Encounter (Signed)
Pt's husband called to state pt has had onset of GI upset with noted dark stools post 48 hours.  Pt present during phone conversation and this RN was able to obtain information from the both of them.  Stools are 1-2 a day- described as " formed but very dark ".  Pt and husband deny and bright red blood or stools that are "jiggly"-  Husband stated " I bought several of those chocolate pecan candies and she had a couple of those and then her stomach became upset"  Pt is overall having decreased energy- no light headedness.  Nicole [name] has known thrombocytopenia and stage IV breast cancer.  Pt will come in am for lab and visit with symptom management for further assessment.  URGENT pof sent and this RN contacted urgent scheduler per above.

## 2016-04-11 ENCOUNTER — Ambulatory Visit: Payer: BLUE CROSS/BLUE SHIELD

## 2016-04-11 ENCOUNTER — Encounter (HOSPITAL_COMMUNITY)
Admission: AD | Disposition: A | Discharge status: Home / Self Care | Payer: Self-pay | Source: Ambulatory Visit | Attending: Internal Medicine

## 2016-04-11 ENCOUNTER — Ambulatory Visit (HOSPITAL_BASED_OUTPATIENT_CLINIC_OR_DEPARTMENT_OTHER): Payer: BLUE CROSS/BLUE SHIELD | Admitting: Nurse Practitioner

## 2016-04-11 ENCOUNTER — Inpatient Hospital Stay (HOSPITAL_COMMUNITY): Payer: BLUE CROSS/BLUE SHIELD

## 2016-04-11 ENCOUNTER — Other Ambulatory Visit (HOSPITAL_BASED_OUTPATIENT_CLINIC_OR_DEPARTMENT_OTHER): Payer: BLUE CROSS/BLUE SHIELD

## 2016-04-11 ENCOUNTER — Ambulatory Visit (HOSPITAL_COMMUNITY)
Admission: RE | Admit: 2016-04-11 | Discharge: 2016-04-11 | Disposition: A | Discharge status: Home / Self Care | Payer: BLUE CROSS/BLUE SHIELD | Source: Ambulatory Visit | Attending: Gastroenterology | Admitting: Gastroenterology

## 2016-04-11 ENCOUNTER — Ambulatory Visit (HOSPITAL_COMMUNITY): Payer: BLUE CROSS/BLUE SHIELD | Admitting: Anesthesiology

## 2016-04-11 ENCOUNTER — Other Ambulatory Visit: Payer: Self-pay | Admitting: Nurse Practitioner

## 2016-04-11 ENCOUNTER — Ambulatory Visit (HOSPITAL_COMMUNITY)
Admission: RE | Admit: 2016-04-11 | Discharge: 2016-04-11 | Disposition: A | Discharge status: Home / Self Care | Payer: BLUE CROSS/BLUE SHIELD | Source: Ambulatory Visit | Attending: Oncology | Admitting: Oncology

## 2016-04-11 ENCOUNTER — Inpatient Hospital Stay (HOSPITAL_COMMUNITY)
Admission: AD | Admit: 2016-04-11 | Discharge: 2016-04-15 | DRG: 357 | Disposition: A | Discharge status: Home / Self Care | Payer: BLUE CROSS/BLUE SHIELD | Source: Ambulatory Visit | Attending: Internal Medicine | Admitting: Internal Medicine

## 2016-04-11 ENCOUNTER — Encounter (HOSPITAL_COMMUNITY): Payer: Self-pay

## 2016-04-11 VITALS — BP 114/97 | HR 105 | Temp 98.2°F | Resp 18 | Ht 64.0 in | Wt 143.7 lb

## 2016-04-11 DIAGNOSIS — C7801 Secondary malignant neoplasm of right lung: Secondary | ICD-10-CM | POA: Diagnosis not present

## 2016-04-11 DIAGNOSIS — R161 Splenomegaly, not elsewhere classified: Secondary | ICD-10-CM | POA: Diagnosis not present

## 2016-04-11 DIAGNOSIS — D500 Iron deficiency anemia secondary to blood loss (chronic): Secondary | ICD-10-CM | POA: Diagnosis not present

## 2016-04-11 DIAGNOSIS — K922 Gastrointestinal hemorrhage, unspecified: Secondary | ICD-10-CM

## 2016-04-11 DIAGNOSIS — C7800 Secondary malignant neoplasm of unspecified lung: Secondary | ICD-10-CM | POA: Diagnosis present

## 2016-04-11 DIAGNOSIS — C50411 Malignant neoplasm of upper-outer quadrant of right female breast: Secondary | ICD-10-CM

## 2016-04-11 DIAGNOSIS — Z923 Personal history of irradiation: Secondary | ICD-10-CM

## 2016-04-11 DIAGNOSIS — I864 Gastric varices: Secondary | ICD-10-CM | POA: Diagnosis not present

## 2016-04-11 DIAGNOSIS — C50911 Malignant neoplasm of unspecified site of right female breast: Secondary | ICD-10-CM

## 2016-04-11 DIAGNOSIS — Z9221 Personal history of antineoplastic chemotherapy: Secondary | ICD-10-CM

## 2016-04-11 DIAGNOSIS — I8500 Esophageal varices without bleeding: Secondary | ICD-10-CM | POA: Diagnosis not present

## 2016-04-11 DIAGNOSIS — K639 Disease of intestine, unspecified: Secondary | ICD-10-CM | POA: Diagnosis not present

## 2016-04-11 DIAGNOSIS — I8501 Esophageal varices with bleeding: Principal | ICD-10-CM | POA: Diagnosis present

## 2016-04-11 DIAGNOSIS — I8290 Acute embolism and thrombosis of unspecified vein: Secondary | ICD-10-CM

## 2016-04-11 DIAGNOSIS — R935 Abnormal findings on diagnostic imaging of other abdominal regions, including retroperitoneum: Secondary | ICD-10-CM | POA: Diagnosis not present

## 2016-04-11 DIAGNOSIS — R188 Other ascites: Secondary | ICD-10-CM | POA: Diagnosis not present

## 2016-04-11 DIAGNOSIS — D649 Anemia, unspecified: Secondary | ICD-10-CM

## 2016-04-11 DIAGNOSIS — Z853 Personal history of malignant neoplasm of breast: Secondary | ICD-10-CM

## 2016-04-11 DIAGNOSIS — Z79811 Long term (current) use of aromatase inhibitors: Secondary | ICD-10-CM | POA: Diagnosis not present

## 2016-04-11 DIAGNOSIS — D696 Thrombocytopenia, unspecified: Secondary | ICD-10-CM | POA: Diagnosis present

## 2016-04-11 DIAGNOSIS — C50919 Malignant neoplasm of unspecified site of unspecified female breast: Secondary | ICD-10-CM | POA: Diagnosis not present

## 2016-04-11 DIAGNOSIS — K921 Melena: Secondary | ICD-10-CM | POA: Diagnosis not present

## 2016-04-11 DIAGNOSIS — D62 Acute posthemorrhagic anemia: Secondary | ICD-10-CM

## 2016-04-11 DIAGNOSIS — K920 Hematemesis: Secondary | ICD-10-CM | POA: Diagnosis not present

## 2016-04-11 DIAGNOSIS — D638 Anemia in other chronic diseases classified elsewhere: Secondary | ICD-10-CM | POA: Diagnosis not present

## 2016-04-11 HISTORY — PX: ESOPHAGOGASTRODUODENOSCOPY (EGD) WITH PROPOFOL: SHX5813

## 2016-04-11 LAB — CBC WITH DIFFERENTIAL/PLATELET
BASO%: 0.3 % (ref 0.0–2.0)
Basophils Absolute: 0 10*3/uL (ref 0.0–0.1)
EOS%: 1 % (ref 0.0–7.0)
Eosinophils Absolute: 0.1 10*3/uL (ref 0.0–0.5)
HCT: 24.8 % — ABNORMAL LOW (ref 34.8–46.6)
HGB: 8.4 g/dL — ABNORMAL LOW (ref 11.6–15.9)
LYMPH%: 27.5 % (ref 14.0–49.7)
MCH: 29.8 pg (ref 25.1–34.0)
MCHC: 33.9 g/dL (ref 31.5–36.0)
MCV: 87.9 fL (ref 79.5–101.0)
MONO#: 0.5 10*3/uL (ref 0.1–0.9)
MONO%: 8.1 % (ref 0.0–14.0)
NEUT%: 63.1 % (ref 38.4–76.8)
NEUTROS ABS: 3.9 10*3/uL (ref 1.5–6.5)
PLATELETS: 119 10*3/uL — AB (ref 145–400)
RBC: 2.82 10*6/uL — AB (ref 3.70–5.45)
RDW: 15.1 % — ABNORMAL HIGH (ref 11.2–14.5)
WBC: 6.2 10*3/uL (ref 3.9–10.3)
lymph#: 1.7 10*3/uL (ref 0.9–3.3)

## 2016-04-11 LAB — VITAMIN B12: VITAMIN B 12: 202 pg/mL (ref 180–914)

## 2016-04-11 LAB — IRON AND TIBC
Iron: 27 ug/dL — ABNORMAL LOW (ref 28–170)
Saturation Ratios: 8 % — ABNORMAL LOW (ref 10.4–31.8)
TIBC: 340 ug/dL (ref 250–450)
UIBC: 313 ug/dL

## 2016-04-11 LAB — COMPREHENSIVE METABOLIC PANEL
ALT: 18 U/L (ref 0–55)
ANION GAP: 7 meq/L (ref 3–11)
AST: 20 U/L (ref 5–34)
Albumin: 3.3 g/dL — ABNORMAL LOW (ref 3.5–5.0)
Alkaline Phosphatase: 40 U/L (ref 40–150)
BILIRUBIN TOTAL: 0.78 mg/dL (ref 0.20–1.20)
BUN: 33.1 mg/dL — ABNORMAL HIGH (ref 7.0–26.0)
CHLORIDE: 111 meq/L — AB (ref 98–109)
CO2: 23 meq/L (ref 22–29)
CREATININE: 0.9 mg/dL (ref 0.6–1.1)
Calcium: 8.3 mg/dL — ABNORMAL LOW (ref 8.4–10.4)
EGFR: 69 mL/min/{1.73_m2} — ABNORMAL LOW (ref >90)
GLUCOSE: 91 mg/dL (ref 70–140)
Potassium: 3.7 mEq/L (ref 3.5–5.1)
SODIUM: 141 meq/L (ref 136–145)
TOTAL PROTEIN: 5.7 g/dL — AB (ref 6.4–8.3)

## 2016-04-11 LAB — FERRITIN: FERRITIN: 18 ng/mL (ref 11–307)

## 2016-04-11 LAB — RETICULOCYTES
RBC.: 2.37 MIL/uL — AB (ref 3.87–5.11)
RETIC CT PCT: 5.6 % — AB (ref 0.4–3.1)
Retic Count, Absolute: 132.7 10*3/uL (ref 19.0–186.0)

## 2016-04-11 LAB — FOLATE: Folate: 20.3 ng/mL (ref >5.9)

## 2016-04-11 LAB — PREPARE RBC (CROSSMATCH)

## 2016-04-11 LAB — MRSA PCR SCREENING: MRSA BY PCR: POSITIVE — AB

## 2016-04-11 SURGERY — ESOPHAGOGASTRODUODENOSCOPY (EGD) WITH PROPOFOL
Anesthesia: Monitor Anesthesia Care

## 2016-04-11 MED ORDER — PROPOFOL 500 MG/50ML IV EMUL
INTRAVENOUS | Status: DC | PRN
  Administered 2016-04-11: 125 ug/kg/min via INTRAVENOUS

## 2016-04-11 MED ORDER — ONDANSETRON HCL 4 MG PO TABS: 4.0000 mg | ORAL_TABLET | Freq: Four times a day (QID) | ORAL | Status: DC | PRN

## 2016-04-11 MED ORDER — TRAMADOL HCL 50 MG PO TABS
50.0000 mg | ORAL_TABLET | Freq: Four times a day (QID) | ORAL | Status: DC | PRN
  Administered 2016-04-11 – 2016-04-14 (×4): 50 mg via ORAL
  Filled 2016-04-11 (×4): qty 1

## 2016-04-11 MED ORDER — LETROZOLE 2.5 MG PO TABS
2.5000 mg | ORAL_TABLET | Freq: Every day | ORAL | Status: DC
  Administered 2016-04-11 – 2016-04-15 (×5): 2.5 mg via ORAL
  Filled 2016-04-11 (×5): qty 1

## 2016-04-11 MED ORDER — ONDANSETRON HCL 4 MG/2ML IJ SOLN: 4.0000 mg | Freq: Four times a day (QID) | INTRAMUSCULAR | Status: DC | PRN

## 2016-04-11 MED ORDER — SODIUM CHLORIDE 0.9 % IV SOLN
INTRAVENOUS | Status: AC
  Administered 2016-04-12: 1000 mL via INTRAVENOUS

## 2016-04-11 MED ORDER — LIDOCAINE HCL (CARDIAC) 20 MG/ML IV SOLN
INTRAVENOUS | Status: AC
  Filled 2016-04-11: qty 5

## 2016-04-11 MED ORDER — SODIUM CHLORIDE 0.9 % IV SOLN: INTRAVENOUS | Status: DC

## 2016-04-11 MED ORDER — VITAMIN D 1000 UNITS PO TABS: 2000.0000 [IU] | ORAL_TABLET | Freq: Once | ORAL | Status: DC

## 2016-04-11 MED ORDER — MORPHINE SULFATE (PF) 2 MG/ML IV SOLN: 2.0000 mg | INTRAVENOUS | Status: DC | PRN

## 2016-04-11 MED ORDER — SODIUM CHLORIDE 0.9 % IV SOLN
Freq: Once | INTRAVENOUS | Status: AC
Start: 2016-04-11 — End: 2016-04-11
  Administered 2016-04-11: 17:00:00 via INTRAVENOUS

## 2016-04-11 MED ORDER — IOPAMIDOL (ISOVUE-300) INJECTION 61%
100.0000 mL | Freq: Once | INTRAVENOUS | Status: AC | PRN
  Administered 2016-04-11: 100 mL via INTRAVENOUS

## 2016-04-11 MED ORDER — POLYETHYLENE GLYCOL 3350 17 G PO PACK
17.0000 g | PACK | Freq: Every day | ORAL | Status: DC | PRN
  Administered 2016-04-15: 17 g via ORAL
  Filled 2016-04-11: qty 1

## 2016-04-11 MED ORDER — VENLAFAXINE HCL ER 75 MG PO CP24
75.0000 mg | ORAL_CAPSULE | Freq: Every day | ORAL | Status: DC
  Administered 2016-04-11 – 2016-04-15 (×5): 75 mg via ORAL
  Filled 2016-04-11 (×5): qty 1

## 2016-04-11 MED ORDER — LACTATED RINGERS IV SOLN
INTRAVENOUS | Status: DC | PRN
  Administered 2016-04-11: 12:00:00 via INTRAVENOUS

## 2016-04-11 MED ORDER — MUPIROCIN 2 % EX OINT
1.0000 "application " | TOPICAL_OINTMENT | Freq: Two times a day (BID) | CUTANEOUS | Status: DC
  Administered 2016-04-12 – 2016-04-15 (×7): 1 via NASAL
  Filled 2016-04-11 (×6): qty 22

## 2016-04-11 MED ORDER — VITAMIN D 1000 UNITS PO TABS
4000.0000 [IU] | ORAL_TABLET | Freq: Every day | ORAL | Status: DC
  Administered 2016-04-11 – 2016-04-14 (×4): 4000 [IU] via ORAL
  Filled 2016-04-11 (×4): qty 4

## 2016-04-11 MED ORDER — PROPOFOL 10 MG/ML IV BOLUS
INTRAVENOUS | Status: AC
  Filled 2016-04-11: qty 40

## 2016-04-11 MED ORDER — CHLORHEXIDINE GLUCONATE CLOTH 2 % EX PADS
6.0000 | MEDICATED_PAD | Freq: Every day | CUTANEOUS | Status: DC
  Administered 2016-04-13 – 2016-04-15 (×3): 6 via TOPICAL

## 2016-04-11 MED ORDER — PROPOFOL 10 MG/ML IV BOLUS
INTRAVENOUS | Status: DC | PRN
  Administered 2016-04-11 (×3): 20 mg via INTRAVENOUS

## 2016-04-11 SURGICAL SUPPLY — 14 items

## 2016-04-11 NOTE — Transfer of Care (Signed)
Immediate Anesthesia Transfer of Care Note  Patient: Nicole [name]  Procedure(s) Performed: Procedure(s): ESOPHAGOGASTRODUODENOSCOPY (EGD) WITH PROPOFOL (N/A)  Patient Location: PACU  Anesthesia Type:MAC  Level of Consciousness: awake, alert  and oriented  Airway & Oxygen Therapy: Patient Spontanous Breathing and Patient connected to face mask oxygen  Post-op Assessment: Report given to RN and Post -op Vital signs reviewed and stable  Post vital signs: Reviewed and stable  Last Vitals:  Filed Vitals:   04/11/16 1157  BP: 135/59  Pulse: 97  Temp: 36.6 C  Resp: 24    Complications: No apparent anesthesia complications

## 2016-04-11 NOTE — Progress Notes (Signed)
CRITICAL VALUE ALERT  Critical value received:  Positive MRSA  Date of notification: 04/11/2016  Time of notification:  1945  Critical value read back:yes  Nurse who received alert:  Salvatore Marvel  MD notified (1st page):  Paged on call,to make aware  Time of first page:  1952  MD notified (2nd page):  Time of second page:  Responding MD:  N/a placed standing orders per protocaol  Time MD responded:  n/a

## 2016-04-11 NOTE — Consult Note (Addendum)
Reason for Consult: GI blood loss Referring Physician: Gus Magrinat  Nicole [name] is an 60 y.o. female.  HPI: Patient seen at the request of the oncology team for guaiac positive anemia and she has had some decreased energy and fatigue for some time and her last hemoglobin was 14 but she has no GI symptoms until this week when she had some midepigastric discomfort nausea vomiting with may be a few specks of blood and has had some black stools for 2-3 days without any Pepto-Bismol and she is not on any aspirin or nonsteroidals or blood thinners and her office computer chart and previous colonoscopy 1 year ago was reviewed and her case was discussed with her husband as well and she has not been losing weight and has no other specific complaints and her questionable history of inflammatory bowel disease on discussion with her and her husband sounds more like IBS which resolved years ago  Past Medical History  Diagnosis Date  . IBD (inflammatory bowel disease)     resolved  . Heart murmur     mild, no cardiologist  . Blood transfusion 2012  . Radiation 07/21/12-09/07/12    5940 cGy  . Breast CA ([place])     breast ca dx 06/2010, stage 4, right     Past Surgical History  Procedure Laterality Date  . Portacath placement  2011    left side  . Dilation and curettage of uterus  2004?  [name] Mastectomy  06/02/12    right breast  . Port-a-cath removal      Family History  Problem Relation Age of Onset  . Cancer Neg Hx     Social History:  reports that she has never smoked. She has never used smokeless tobacco. She reports that she does not drink alcohol or use illicit drugs.  Allergies: No Known Allergies  Medications: I have reviewed the patient's current medications.  Results for orders placed or performed in visit on 04/11/16 (from the past 48 hour(s))  CBC with Differential     Status: Abnormal   Collection Time: 04/11/16  8:34 AM  Result Value Ref Range   WBC 6.2 3.9 - 10.3 10e3/uL   NEUT# 3.9 1.5 - 6.5 10e3/uL   HGB 8.4 (L) 11.6 - 15.9 g/dL   HCT 24.8 (L) 34.8 - 46.6 %   Platelets 119 (L) 145 - 400 10e3/uL   MCV 87.9 79.5 - 101.0 fL   MCH 29.8 25.1 - 34.0 pg   MCHC 33.9 31.5 - 36.0 g/dL   RBC 2.82 (L) 3.70 - 5.45 10e6/uL   RDW 15.1 (H) 11.2 - 14.5 %   lymph# 1.7 0.9 - 3.3 10e3/uL   MONO# 0.5 0.1 - 0.9 10e3/uL   Eosinophils Absolute 0.1 0.0 - 0.5 10e3/uL   Basophils Absolute 0.0 0.0 - 0.1 10e3/uL   NEUT% 63.1 38.4 - 76.8 %   LYMPH% 27.5 14.0 - 49.7 %   MONO% 8.1 0.0 - 14.0 %   EOS% 1.0 0.0 - 7.0 %   BASO% 0.3 0.0 - 2.0 %  Comprehensive metabolic panel     Status: Abnormal   Collection Time: 04/11/16  8:34 AM  Result Value Ref Range   Sodium 141 136 - 145 mEq/L   Potassium 3.7 3.5 - 5.1 mEq/L   Chloride 111 (H) 98 - 109 mEq/L   CO2 23 22 - 29 mEq/L   Glucose 91 70 - 140 mg/dl    Comment: Glucose reference range is for nonfasting patients.  Fasting glucose reference range is 70- 100.   BUN 33.1 (H) 7.0 - 26.0 mg/dL   Creatinine 0.9 0.6 - 1.1 mg/dL   Total Bilirubin 0.78 0.20 - 1.20 mg/dL   Alkaline Phosphatase 40 40 - 150 U/L   AST 20 5 - 34 U/L   ALT 18 0 - 55 U/L   Total Protein 5.7 (L) 6.4 - 8.3 g/dL   Albumin 3.3 (L) 3.5 - 5.0 g/dL   Calcium 8.3 (L) 8.4 - 10.4 mg/dL   Anion Gap 7 3 - 11 mEq/L   EGFR 69 (L) >90 ml/min/1.73 m2    Comment: eGFR is calculated using the CKD-EPI Creatinine Equation (2009)    No results found.  ROS negative except above There were no vitals taken for this visit. Physical Exam Vital signs stable afebrile no acute distress exam please see preassessment evaluation labs reviewed Assessment/Plan: Guaiac positive anemia Plan: The risks benefits methods of endoscopy was discussed with the patient and her husband and we compared it to the colonoscopy she had a year ago and will proceed today with further workup and plans and recommendations pending those findings  [place] E 04/11/2016, 11:56 AM

## 2016-04-11 NOTE — Op Note (Signed)
[facility] Patient Name: Nicole [name] Procedure Date: 04/11/2016 MRN: [id number] Attending MD: Clarene Essex , MD Date of Birth: October 06, 1956 CSN:  Age: 60 Admit Type: Outpatient Procedure:                Upper GI endoscopy Indications:              Melena Providers:                Clarene Essex, MD, Cleda Daub, RN, Corliss Parish,                            Technician Referring MD:              Medicines:                Propofol total dose 123456 mg IV Complications:            No immediate complications. Estimated Blood Loss:     Estimated blood loss: none. Procedure:                Pre-Anesthesia Assessment:                           - Prior to the procedure, a History and Physical                            was performed, and patient medications and                            allergies were reviewed. The patient's tolerance of                            previous anesthesia was also reviewed. The risks                            and benefits of the procedure and the sedation                            options and risks were discussed with the patient.                            All questions were answered, and informed consent                            was obtained. Prior Anticoagulants: The patient has                            taken no previous anticoagulant or antiplatelet                            agents. ASA Grade Assessment: II - A patient with                            mild systemic disease. After reviewing the risks  and benefits, the patient was deemed in                            satisfactory condition to undergo the procedure.                           After obtaining informed consent, the endoscope was                            passed under direct vision. Throughout the                            procedure, the patient's blood pressure, pulse, and                            oxygen saturations were monitored continuously. The                             [id number] scope was introduced through the                            mouth, and advanced to the second part of duodenum.                            The upper GI endoscopy was accomplished without                            difficulty. The patient tolerated the procedure                            well. Scope In: Scope Out: Findings:      Grade I varices were found in the distal esophagus.      Varices with very minimal short lived oozing blood were found in the       cardia and in the gastric fundus. There were stigmata of very recent       minimal bleeding. it stopped spontaneously      The duodenal bulb, first portion of the duodenum and second portion of       the duodenum were normal.      The exam was otherwise without abnormality. Impression:               - Grade I esophageal varices.                           - Gastric varices, very minimal oozing blood                            stopped spontaneously                           - Normal duodenal bulb, first portion of the                            duodenum and second portion of the duodenum.                           -  The examination was otherwise normal.                           - No specimens collected. Moderate Sedation:      N/A- Per Anesthesia Care Recommendation:           - Continue present medications.                           - Return to GI office PRN.                           - Telephone GI clinic if symptomatic PRN.                           - Refer to an interventional radiologist today.                           - Put patient on a clear liquid diet starting today. Procedure Code(s):        --- Professional ---                           [phone], Esophagogastroduodenoscopy, flexible,                            transoral; diagnostic, including collection of                            specimen(s) by brushing or washing, when performed                            (separate  procedure) Diagnosis Code(s):        --- Professional ---                           I85.00, Esophageal varices without bleeding                           I86.4, Gastric varices                           K92.2, Gastrointestinal hemorrhage, unspecified                           K92.1, Melena (includes Hematochezia) CPT copyright 2016 [employer] Association. All rights reserved. The codes documented in this report are preliminary and upon coder review may  be revised to meet current compliance requirements. Clarene Essex, MD Clarene Essex, MD 04/11/2016 12:55:04 PM This report has been signed electronically. Number of Addenda: 0

## 2016-04-11 NOTE — Anesthesia Preprocedure Evaluation (Addendum)
Anesthesia Evaluation  Patient identified by MRN, date of birth, ID band Patient awake    Reviewed: Allergy & Precautions, NPO status , Patient's Chart, lab work & pertinent test results  Airway Mallampati: II  TM Distance: >3 FB Neck ROM: Full    Dental no notable dental hx.    Pulmonary neg pulmonary ROS,    Pulmonary exam normal breath sounds clear to auscultation       Cardiovascular negative cardio ROS Normal cardiovascular exam+ Valvular Problems/Murmurs  Rhythm:Regular Rate:Normal     Neuro/Psych negative neurological ROS  negative psych ROS   GI/Hepatic negative GI ROS, Neg liver ROS,   Endo/Other  negative endocrine ROS  Renal/GU negative Renal ROS  negative genitourinary   Musculoskeletal negative musculoskeletal ROS (+)   Abdominal   Peds negative pediatric ROS (+)  Hematology New anemia. Hgb 8.4   Anesthesia Other Findings   Reproductive/Obstetrics negative OB ROS                            Anesthesia Physical Anesthesia Plan  ASA: II  Anesthesia Plan: MAC   Post-op Pain Management:    Induction: Intravenous  Airway Management Planned: Natural Airway  Additional Equipment:   Intra-op Plan:   Post-operative Plan:   Informed Consent: I have reviewed the patients History and Physical, chart, labs and discussed the procedure including the risks, benefits and alternatives for the proposed anesthesia with the patient or authorized representative who has indicated his/her understanding and acceptance.   Dental advisory given  Plan Discussed with: CRNA  Anesthesia Plan Comments:         Anesthesia Quick Evaluation

## 2016-04-11 NOTE — Progress Notes (Signed)
Gastric varices with minimal esophageal varices found on EGD today and I have discussed her case with oncology you will have her admitted and I have discussed her case with interventional radiology Dr. Anselm Pancoast who has recommended a CT of the abdomen and pelvis with BRTO  Protocol to see if she is a candidate for that or a TIPS in the meantime I've discussed all of the above with the patient and her husband and will allow clear liquids and recommend IV Protonix and will wait on the CT and IR consult and transfuse in the meantime

## 2016-04-11 NOTE — Anesthesia Postprocedure Evaluation (Signed)
Anesthesia Post Note  Patient: Nicole [name]  Procedure(s) Performed: Procedure(s) (LRB): ESOPHAGOGASTRODUODENOSCOPY (EGD) WITH PROPOFOL (N/A)  Patient location during evaluation: PACU Anesthesia Type: MAC Level of consciousness: awake and alert Pain management: pain level controlled Vital Signs Assessment: post-procedure vital signs reviewed and stable Respiratory status: spontaneous breathing, nonlabored ventilation, respiratory function stable and patient connected to nasal cannula oxygen Cardiovascular status: stable and blood pressure returned to baseline Anesthetic complications: no    Last Vitals:  Filed Vitals:   04/11/16 1930 04/11/16 2021  BP: 150/81 142/79  Pulse: 85 87  Temp: 36.9 C 37 C  Resp: 18 18    Last Pain: There were no vitals filed for this visit.               Bonnell Placzek J

## 2016-04-11 NOTE — H&P (Signed)
[facility] History and Physical  Nicole [name] [id number] DOB: 02/14/1956 DOA: 04/11/2016  Referring physician: Dr. Jana Hakim PCP: No PCP Per Patient   Chief Complaint: upper gi bleeding  HPI: Nicole [name] is a 60 y.o. female past medical history of breast cancer metastatic to the long with past medical history of chemotherapy therapy and radiation currently on Femara, referred by the oncologist for an endoscopy for melena and positive guaiac, she relates she has been feeling fatigued with decreased energy and short of breath with exertion in the last several days. She relates she's had melanotic stools for the last 4 days and an episode of coffee-ground emesis about 2 days prior to admission, she relates she took some had to be small and felt better. Her hemoglobin had dropped from 14 to 8 on the day of admission She was taken to the endoscopy suite today for endoscopy and was found to have a grade 1 esophageal varices with minimal oozing which stopped spontaneously there were no ulcers.   Review of Systems:  Constitutional:  No weight loss, night sweats, Fevers, chills, fatigue.  HEENT:  No headaches, Difficulty swallowing,Tooth/dental problems,Sore throat,  No sneezing, itching, ear ache, nasal congestion, post nasal drip,  Cardio-vascular:  No chest pain, Orthopnea, PND, swelling in lower extremities, anasarca, dizziness, palpitations  GI:  No heartburn, indigestion, abdominal pain, nausea, vomiting, diarrhea, change in bowel habits, loss of appetite  Resp:  No shortness of breath with exertion or at rest. No excess mucus, no productive cough, No non-productive cough, No coughing up of blood.No change in color of mucus.No wheezing.No chest wall deformity  Skin:  no rash or lesions.  GU:  no dysuria, change in color of urine, no urgency or frequency. No flank pain.  Musculoskeletal:  No joint pain or swelling. No decreased range of motion. No back pain.  Psych:  No  change in mood or affect. No depression or anxiety. No memory loss.   Past Medical History  Diagnosis Date  . IBD (inflammatory bowel disease)     resolved  . Heart murmur     mild, no cardiologist  . Blood transfusion 2012  . Radiation 07/21/12-09/07/12    5940 cGy  . Breast CA ([place])     breast ca dx 06/2010, stage 4, right    Past Surgical History  Procedure Laterality Date  . Portacath placement  2011    left side  . Dilation and curettage of uterus  2004?  [name] Mastectomy  06/02/12    right breast  . Port-a-cath removal     Social History:  reports that she has never smoked. She has never used smokeless tobacco. She reports that she does not drink alcohol or use illicit drugs.  No Known Allergies  Family History  Problem Relation Age of Onset  . Cancer Neg Hx   . COPD Mother   . AAA (abdominal aortic aneurysm) Mother   . COPD Father     Prior to Admission medications   Medication Sig Start Date End Date Taking? Authorizing Provider  alendronate (FOSAMAX) 70 MG tablet TAKE 1 TABLET BY MOUTH ONCE A WEEK 08/22/15  Yes Chauncey Cruel, MD  Calcium Carbonate (CALTRATE 600 PO) Take by mouth 2 (two) times daily.    Yes Historical Provider, MD  Cholecalciferol (VITAMIN D PO) Take 2,000 Units by mouth once.    Yes Historical Provider, MD  letrozole (FEMARA) 2.5 MG tablet Take 1 tablet (2.5 mg total) by mouth daily. 11/01/15  Yes Chauncey Cruel, MD  venlafaxine XR (EFFEXOR-XR) 75 MG 24 hr capsule Take 1 capsule (75 mg total) by mouth daily. 11/01/15  Yes Chauncey Cruel, MD   Physical Exam: Filed Vitals:   04/11/16 1305 04/11/16 1310 04/11/16 1315 04/11/16 1320  BP:  141/67  145/66  Pulse:  81  81  Temp:      TempSrc:      Resp:  13  16  Height:      Weight:      SpO2: 100% 100% 100% 100%    Wt Readings from Last 3 Encounters:  04/11/16 64.864 kg (143 lb)  04/11/16 65.182 kg (143 lb 11.2 oz)  11/01/15 66.633 kg (146 lb 14.4 oz)    General:  Appears calm and  comfortable Eyes: PERRL, normal lids, irises & conjunctiva ENT: grossly normal hearing, lips & tongue Neck: no LAD, masses or thyromegaly Cardiovascular: RRR, no m/r/g. No LE edema. Telemetry: SR, no arrhythmias  Respiratory: CTA bilaterally, no w/r/r. Normal respiratory effort. Abdomen: soft, ntnd Skin: no rash or induration seen on limited exam Musculoskeletal: grossly normal tone BUE/BLE Psychiatric: grossly normal mood and affect, speech fluent and appropriate Neurologic: grossly non-focal.          Labs on Admission:  Basic Metabolic Panel:  Recent Labs Lab 04/11/16 0834  NA 141  K 3.7  CO2 23  GLUCOSE 91  BUN 33.1*  CREATININE 0.9  CALCIUM 8.3*   Liver Function Tests:  Recent Labs Lab 04/11/16 0834  AST 20  ALT 18  ALKPHOS 40  BILITOT 0.78  PROT 5.7*  ALBUMIN 3.3*   No results for input(s): LIPASE, AMYLASE in the last 168 hours. No results for input(s): AMMONIA in the last 168 hours. CBC:  Recent Labs Lab 04/11/16 0834  WBC 6.2  NEUTROABS 3.9  HGB 8.4*  HCT 24.8*  MCV 87.9  PLT 119*   Cardiac Enzymes: No results for input(s): CKTOTAL, CKMB, CKMBINDEX, TROPONINI in the last 168 hours.  BNP (last 3 results) No results for input(s): BNP in the last 8760 hours.  ProBNP (last 3 results) No results for input(s): PROBNP in the last 8760 hours.  CBG: No results for input(s): GLUCAP in the last 168 hours.  Radiological Exams on Admission: No results found.  EKG: Independently reviewed.  Assessment/Plan Acute upper GI bleed due to   Esophageal varices (Reader): She densely has portal hypertension and probably has a degree of cirrhosis. The differential is broad but is unlikely alcohol, most likely cause of the chemotherapy and/or radiation induced, also hemachromatosis and Wilson's disease are unlikely, but this could be nonfatty liver she's not an obese female and is not a diabetic, will go ahead and check an ANA at this could also be autoimmune  hepatitis which is unlikely . He does not have a history of celiac which makes it unlikely. Alpha-1 antitrypsin is also likely at this age. MRA sclerae I's and cholangitis also on the differential, but it seems unlikely. We'll get a CT scan of the abdomen and pelvis, will check a ferritin before transfusion  Breast cancer metastasized to lung [facility]) Cont femar oncology informed and will follow up.  Code Status: full DVT Prophylaxis:SCD's Family Communication: none Disposition Plan: inpatient  Time spent: 37 min  Charlynne Cousins [facility] Pager [phone]

## 2016-04-12 ENCOUNTER — Encounter (HOSPITAL_COMMUNITY): Payer: Self-pay | Admitting: General Surgery

## 2016-04-12 ENCOUNTER — Ambulatory Visit (HOSPITAL_COMMUNITY): Admit: 2016-04-12 | Payer: BLUE CROSS/BLUE SHIELD

## 2016-04-12 DIAGNOSIS — D500 Iron deficiency anemia secondary to blood loss (chronic): Secondary | ICD-10-CM | POA: Diagnosis not present

## 2016-04-12 DIAGNOSIS — C50911 Malignant neoplasm of unspecified site of right female breast: Secondary | ICD-10-CM

## 2016-04-12 DIAGNOSIS — K921 Melena: Secondary | ICD-10-CM | POA: Diagnosis not present

## 2016-04-12 LAB — BASIC METABOLIC PANEL
Anion gap: 5 (ref 5–15)
BUN: 17 mg/dL (ref 6–20)
CHLORIDE: 112 mmol/L — AB (ref 101–111)
CO2: 23 mmol/L (ref 22–32)
Calcium: 7.6 mg/dL — ABNORMAL LOW (ref 8.9–10.3)
Creatinine, Ser: 0.82 mg/dL (ref 0.44–1.00)
GFR calc Af Amer: >60 mL/min (ref >60)
Glucose, Bld: 100 mg/dL — ABNORMAL HIGH (ref 65–99)
Potassium: 3.6 mmol/L (ref 3.5–5.1)
SODIUM: 140 mmol/L (ref 135–145)

## 2016-04-12 LAB — CBC
HEMATOCRIT: 29.1 % — AB (ref 36.0–46.0)
HEMOGLOBIN: 10.2 g/dL — AB (ref 12.0–15.0)
MCH: 30.9 pg (ref 26.0–34.0)
MCHC: 35.1 g/dL (ref 30.0–36.0)
MCV: 88.2 fL (ref 78.0–100.0)
Platelets: 65 10*3/uL — ABNORMAL LOW (ref 150–400)
RBC: 3.3 MIL/uL — AB (ref 3.87–5.11)
RDW: 14.4 % (ref 11.5–15.5)
WBC: 3.6 10*3/uL — AB (ref 4.0–10.5)

## 2016-04-12 LAB — PREPARE RBC (CROSSMATCH)

## 2016-04-12 LAB — PROTIME-INR
INR: 1.06 (ref 0.00–1.49)
PROTHROMBIN TIME: 14 s (ref 11.6–15.2)

## 2016-04-12 MED ORDER — LACTATED RINGERS IV SOLN: INTRAVENOUS | Status: DC

## 2016-04-12 NOTE — Progress Notes (Signed)
Report called to Judson Roch, RN at [facility] [employer] [facility] at [facility]. [facility] notified

## 2016-04-12 NOTE — Progress Notes (Signed)
[name] 11:18 AM  Subjective: Patient doing well and a transfusion helped and no signs of further bleeding and her case was rediscussed with her and her husband as well as interventional radiology and her CT reviewed  Objective: Vital signs stable afebrile no acute distress abdomen is soft nontender hemoglobin improved CT results noted  Assessment: Gastric varices  Plan: I've discussed her case with interventional radiology both yesterday and today and they will see her today to discuss their procedure with her in the near future  [facility]  Pager [phone] After 5PM or if no answer call [phone]

## 2016-04-12 NOTE — Consult Note (Signed)
Chief Complaint: bleeding gastric varcies  Referring Physician:Dr. Clarene Essex  Supervising Physician: Daryll Brod  HPI: Nicole [name] is an 60 y.o. female with a history of breast cancer, s/p chemo and radiation, but otherwise healthy who began having melena on Sunday.  This continued over the next several days.  She finally contacted her oncologist to tell them about this issue.  They referred her to Dr. Watt Climes.  She was admitted after her endoscopy that revealed some oozing gastric varices as well as extensive esophageal and gastric varices.  A CT scan was then obtained that revealed a large gastrorenal shunt with extensive varices.  She was felt to be a good candidate for a BRTO.  Therefore, we have been consulted for further evaluation.  Past Medical History:  Past Medical History  Diagnosis Date  . IBD (inflammatory bowel disease)     resolved  . Heart murmur     mild, no cardiologist  . Blood transfusion 2012  . Radiation 07/21/12-09/07/12    5940 cGy  . Breast CA ([place])     breast ca dx 06/2010, stage 4, right     Past Surgical History:  Past Surgical History  Procedure Laterality Date  . Portacath placement  2011    left side  . Dilation and curettage of uterus  2004?  [name] Mastectomy  06/02/12    right breast  . Port-a-cath removal      Family History:  Family History  Problem Relation Age of Onset  . Cancer Neg Hx   . COPD Mother   . AAA (abdominal aortic aneurysm) Mother   . COPD Father     Social History:  reports that she has never smoked. She has never used smokeless tobacco. She reports that she does not drink alcohol or use illicit drugs.  Allergies: No Known Allergies  Medications:   Medication List    ASK your doctor about these medications        alendronate 70 MG tablet  Commonly known as:  FOSAMAX  TAKE 1 TABLET BY MOUTH ONCE A WEEK     CALTRATE 600 PO  Take by mouth daily.     letrozole 2.5 MG tablet  Commonly known as:  FEMARA  Take 1  tablet (2.5 mg total) by mouth daily.     venlafaxine XR 75 MG 24 hr capsule  Commonly known as:  EFFEXOR-XR  Take 1 capsule (75 mg total) by mouth daily.     VITAMIN D PO  Take 4,000 Units by mouth daily.        Please HPI for pertinent positives, otherwise complete 10 system ROS negative.  Mallampati Score: MD Evaluation Airway: WNL Heart: WNL Abdomen: WNL Chest/ Lungs: WNL ASA  Classification: 3 Mallampati/Airway Score: One  Physical Exam: BP 118/69 mmHg  Pulse 73  Temp(Src) 98.6 F (37 C) (Oral)  Resp 18  Ht _0  (1.626 m)  Wt 143 lb (64.864 kg)  BMI 24.53 kg/m2  SpO2 100% Body mass index is 24.53 kg/(m^2). General: pleasant, WD, WN white female who is laying in bed in NAD HEENT: head is normocephalic, atraumatic.  Sclera are noninjected.  PERRL.  Ears and nose without any masses or lesions.  Mouth is pink and moist Heart: regular, rate, and rhythm.  Normal s1,s2. No obvious gallops, or rubs noted.  Palpable radial and pedal pulses bilaterally.  + murmur Lungs: CTAB, no wheezes, rhonchi, or rales noted.  Respiratory effort nonlabored Abd: soft, NT, ND, +BS,  no masses, hernias, or organomegaly MS: all 4 extremities are symmetrical with no cyanosis, clubbing, or edema. Psych: A&Ox3 with an appropriate affect.   Labs: Results for orders placed or performed during the hospital encounter of 04/11/16 (from the past 48 hour(s))  Prepare RBC     Status: None   Collection Time: 04/11/16  4:30 PM  Result Value Ref Range   Order Confirmation ORDER PROCESSED BY BLOOD BANK   Vitamin B12     Status: None   Collection Time: 04/11/16  4:46 PM  Result Value Ref Range   Vitamin B-12 202 180 - 914 pg/mL    Comment: (NOTE) This assay is not validated for testing neonatal or myeloproliferative syndrome specimens for Vitamin B12 levels. Performed at [facility]   Folate     Status: None   Collection Time: 04/11/16  4:46 PM  Result Value Ref Range   Folate 20.3 >5.9  ng/mL    Comment: Performed at [facility]  Iron and TIBC     Status: Abnormal   Collection Time: 04/11/16  4:46 PM  Result Value Ref Range   Iron 27 (L) 28 - 170 ug/dL   TIBC 340 250 - 450 ug/dL   Saturation Ratios 8 (L) 10.4 - 31.8 %   UIBC 313 ug/dL    Comment: Performed at [facility]  Ferritin     Status: None   Collection Time: 04/11/16  4:46 PM  Result Value Ref Range   Ferritin 18 11 - 307 ng/mL    Comment: Performed at [facility]  Reticulocytes     Status: Abnormal   Collection Time: 04/11/16  4:46 PM  Result Value Ref Range   Retic Ct Pct 5.6 (H) 0.4 - 3.1 %   RBC. 2.37 (L) 3.87 - 5.11 MIL/uL   Retic Count, Manual 132.7 19.0 - 186.0 K/uL  MRSA PCR Screening     Status: Abnormal   Collection Time: 04/11/16  5:51 PM  Result Value Ref Range   MRSA by PCR POSITIVE (A) NEGATIVE    Comment:        The GeneXpert MRSA Assay (FDA approved for NASAL specimens only), is one component of a comprehensive MRSA colonization surveillance program. It is not intended to diagnose MRSA infection nor to guide or monitor treatment for MRSA infections. RESULT CALLED TO, READ BACK BY AND VERIFIED WITH: Michelene Gardener [id number] @ [facility] metabolic panel     Status: Abnormal   Collection Time: 04/12/16  2:15 AM  Result Value Ref Range   Sodium 140 135 - 145 mmol/L   Potassium 3.6 3.5 - 5.1 mmol/L   Chloride 112 (H) 101 - 111 mmol/L   CO2 23 22 - 32 mmol/L   Glucose, Bld 100 (H) 65 - 99 mg/dL   BUN 17 6 - 20 mg/dL   Creatinine, Ser 0.82 0.44 - 1.00 mg/dL   Calcium 7.6 (L) 8.9 - 10.3 mg/dL   GFR calc non Af Amer >60 >60 mL/min   GFR calc Af Amer >60 >60 mL/min    Comment: (NOTE) The eGFR has been calculated using the CKD EPI equation. This calculation has not been validated in all clinical situations. eGFR's persistently <60 mL/min signify possible Chronic Kidney Disease.    Anion gap 5 5 - 15  CBC     Status: Abnormal   Collection  Time: 04/12/16  2:17 AM  Result Value Ref Range   WBC 3.6 (  L) 4.0 - 10.5 K/uL   RBC 3.30 (L) 3.87 - 5.11 MIL/uL   Hemoglobin 10.2 (L) 12.0 - 15.0 g/dL   HCT 29.1 (L) 36.0 - 46.0 %   MCV 88.2 78.0 - 100.0 fL   MCH 30.9 26.0 - 34.0 pg   MCHC 35.1 30.0 - 36.0 g/dL   RDW 14.4 11.5 - 15.5 %   Platelets 65 (L) 150 - 400 K/uL    Comment: RESULT REPEATED AND VERIFIED SPECIMEN CHECKED FOR CLOTS PLATELET COUNT CONFIRMED BY SMEAR   Protime-INR     Status: None   Collection Time: 04/12/16  9:59 AM  Result Value Ref Range   Prothrombin Time 14.0 11.6 - 15.2 seconds   INR 1.06 0.00 - 1.49    Imaging: Ct Abdomen Pelvis W Wo Contrast  04/11/2016  CLINICAL DATA:  60 year old female with anemia and gastric varices on endoscopy. Evaluate for BRTO candidacy. EXAM: CT ABDOMEN AND PELVIS WITHOUT AND WITH CONTRAST TECHNIQUE: Multidetector CT imaging of the abdomen and pelvis was performed following the standard protocol before and following the bolus administration of intravenous contrast. CONTRAST:  126m ISOVUE-300 IOPAMIDOL (ISOVUE-300) INJECTION 61% COMPARISON:  Prior CT scan of the abdomen and pelvis 12/02/2012 FINDINGS: Lower chest: No acute findings. The intracardiac blood pool is hypodense relative to the adjacent myocardium consistent with anemia. Hepatobiliary: The liver does not appear overtly cirrhotic. There is no discrete solid hepatic lesion. Stable cyst in the posterior aspect of hepatic segment 6. The portal vein is normal in size. The hepatic veins are normal in size and patent. Please see vascular section below for further detail. Gallbladder is unremarkable. No intra or extrahepatic biliary ductal dilatation. Pancreas: No mass, inflammatory changes, or other significant abnormality. Spleen: Within normal limits in size and appearance. Adrenals/Urinary Tract: No masses identified. No evidence of hydronephrosis. Stomach/Bowel: No evidence of obstruction, inflammatory process, or abnormal fluid  collections. Vascular/Lymphatic: Large gastrorenal shunt resulting in extensive gastric varices in the gastric fundus and cardia. This is almost certainly the source of the patient's bleeding. Additionally, there are small esophageal varices. The primary afferent inflow is via the posterior gastric vein. Additionally, there are contributions from small short gastric veins. No suspicious adenopathy. Reproductive: No mass or other significant abnormality. Other: None. Musculoskeletal: Chronic appearing L1 compression fracture with approximately 40% height loss anteriorly. No significant retropulsion or exaggerated kyphosis. Multilevel degenerative disc disease most noticeable at L4-L5 and L5-S1. No acute fracture or aggressive appearing lytic or blastic osseous lesion. IMPRESSION: 1. Large gastrorenal shunt resulting in extensive gastric varices. The primary afferent inflow is via the posterior gastric vein with lesser contributions from the short gastric veins. The anatomy is amenable to balloon occluded retrograde transvenous obliteration (BRTO). Please see coronal reformatted images for further anatomic detail. 2. Surprisingly, the liver does not appear overtly cirrhotic. However, the presence of a gastro renal shunt is highly suggestive of underlying cirrhosis. 3. The splenic vein remains patent. 4. Chronic appearing L1 compression fracture with approximately 40% height loss. 5. Multilevel degenerative disc disease. 6. Anemia. Signed, HCriselda Peaches MD [facility] Electronically Signed   By: HJacqulynn CadetM.D.   On: 04/11/2016 17:30    Assessment/Plan 1. Bleeding gastric varices  -extensive discussion has been had with multiple IR MDs as well as the patient and her husband.  The patient is a good candidate for a BRTO.  I have discussed this procedure in detail with the patient as well as risks and  complications.  She and her husband  understand and agree. -her labs are all ok, except her platelets are 65K.  I have d/w MD and no need for platelet transfusion at this point.  I will order a T&S in case blood products are needed. -NPO p MN tonight -Risks and Benefits discussed with the patient including, but not limited to bleeding, infection, vascular injury or contrast induced renal failure, nontarget embolization, need to convert to TIPS procedure with possible encephalopathy. All of the patient's questions were answered, patient is agreeable to proceed. Consent signed and in chart.  Thank you for this interesting consult.  I greatly enjoyed meeting Nicole [name] and look forward to participating in their care.  A copy of this report was sent to the requesting provider on this date.  Electronically Signed: Henreitta Cea 04/12/2016, 12:53 PM   I spent a total of 55 Mi[place]utes   in face to face in clinical consultation, greater than 50% of which was counseling/coordinating care for bleeding gastric varices.

## 2016-04-12 NOTE — Progress Notes (Signed)
[facility] PROGRESS NOTE   Nicole [name] [id number] DOB: 1956-10-10 DOA: 04/11/2016 PCP: No PCP Per Patient  HPI/Subjective: Seen with husband at bedside, questions answered. Discussed with interventional radiology, will transfer to [facility] for BRTO in a.m.  Assessment/Plan: Principal Problem:   Acute upper GI bleed Active Problems:   Breast cancer metastasized to lung (HCC)   Esophageal varices (HCC)   Acute blood loss anemia   Acute upper GI bleed due to Esophageal varices ([place]): She densely has portal hypertension but surprisingly the CT scan did not show obvious cirrhosis. The differential is broad but is unlikely alcohol, most likely cause of the chemotherapy and/or radiation induced. CT scan abdomen pelvis reviewed, portal hypertension. Reviewed by interventional radiology, amenable to BRTO Transfer to [facility] for the procedure in the morning, needs anesthesia as well.  Breast cancer metastasized to lung [facility]) Cont [facility] oncology informed and will follow up.  Code Status: Full Code Family Communication: Plan discussed with the patient. Disposition Plan: Remains inpatient Diet: Diet clear liquid Room service appropriate?: Yes; Fluid consistency:: Thin Diet NPO time specified Except for: Sips with Meds  Consultants:  GI  IR  Procedures:  None  Antibiotics:  None   Objective: Filed Vitals:   04/12/16 0504 04/12/16 1416  BP: 118/69 129/64  Pulse: 73 78  Temp: 98.6 F (37 C) 98.3 F (36.8 C)  Resp: 18 18    Intake/Output Summary (Last 24 hours) at 04/12/16 1534 Last data filed at 04/12/16 1300  Gross per 24 hour  Intake   3125 ml  Output      0 ml  Net   3125 ml   Filed Weights   04/11/16 1157 04/11/16 1530  Weight: 64.864 kg (143 lb) 64.864 kg (143 lb)    Exam: General: Alert and awake, oriented x3, not in any acute distress. HEENT: anicteric sclera, pupils reactive to light and accommodation, EOMI CVS: S1-S2 clear, no murmur rubs  or gallops Chest: clear to auscultation bilaterally, no wheezing, rales or rhonchi Abdomen: soft nontender, nondistended, normal bowel sounds, no organomegaly Extremities: no cyanosis, clubbing or edema noted bilaterally Neuro: Cranial nerves II-XII intact, no focal neurological deficits  Data Reviewed: Basic Metabolic Panel:  Recent Labs Lab 04/11/16 0834 04/12/16 0215  NA 141 140  K 3.7 3.6  CL  --  112*  CO2 23 23  GLUCOSE 91 100*  BUN 33.1* 17  CREATININE 0.9 0.82  CALCIUM 8.3* 7.6*   Liver Function Tests:  Recent Labs Lab 04/11/16 0834  AST 20  ALT 18  ALKPHOS 40  BILITOT 0.78  PROT 5.7*  ALBUMIN 3.3*   No results for input(s): LIPASE, AMYLASE in the last 168 hours. No results for input(s): AMMONIA in the last 168 hours. CBC:  Recent Labs Lab 04/11/16 0834 04/12/16 0217  WBC 6.2 3.6*  NEUTROABS 3.9  --   HGB 8.4* 10.2*  HCT 24.8* 29.1*  MCV 87.9 88.2  PLT 119* 65*   Cardiac Enzymes: No results for input(s): CKTOTAL, CKMB, CKMBINDEX, TROPONINI in the last 168 hours. BNP (last 3 results) No results for input(s): BNP in the last 8760 hours.  ProBNP (last 3 results) No results for input(s): PROBNP in the last 8760 hours.  CBG: No results for input(s): GLUCAP in the last 168 hours.  Micro Recent Results (from the past 240 hour(s))  MRSA PCR Screening     Status: Abnormal   Collection Time: 04/11/16  5:51 PM  Result Value Ref Range Status  MRSA by PCR POSITIVE (A) NEGATIVE Final    Comment:        The GeneXpert MRSA Assay (FDA approved for NASAL specimens only), is one component of a comprehensive MRSA colonization surveillance program. It is not intended to diagnose MRSA infection nor to guide or monitor treatment for MRSA infections. RESULT CALLED TO, READ BACK BY AND VERIFIED WITH: Michelene Gardener [id number] @ 1945 BY J SCOTTON      Studies: Ct Abdomen Pelvis W Wo Contrast  04/11/2016  CLINICAL DATA:  60 year old female with anemia  and gastric varices on endoscopy. Evaluate for BRTO candidacy. EXAM: CT ABDOMEN AND PELVIS WITHOUT AND WITH CONTRAST TECHNIQUE: Multidetector CT imaging of the abdomen and pelvis was performed following the standard protocol before and following the bolus administration of intravenous contrast. CONTRAST:  161mL ISOVUE-300 IOPAMIDOL (ISOVUE-300) INJECTION 61% COMPARISON:  Prior CT scan of the abdomen and pelvis 12/02/2012 FINDINGS: Lower chest: No acute findings. The intracardiac blood pool is hypodense relative to the adjacent myocardium consistent with anemia. Hepatobiliary: The liver does not appear overtly cirrhotic. There is no discrete solid hepatic lesion. Stable cyst in the posterior aspect of hepatic segment 6. The portal vein is normal in size. The hepatic veins are normal in size and patent. Please see vascular section below for further detail. Gallbladder is unremarkable. No intra or extrahepatic biliary ductal dilatation. Pancreas: No mass, inflammatory changes, or other significant abnormality. Spleen: Within normal limits in size and appearance. Adrenals/Urinary Tract: No masses identified. No evidence of hydronephrosis. Stomach/Bowel: No evidence of obstruction, inflammatory process, or abnormal fluid collections. Vascular/Lymphatic: Large gastrorenal shunt resulting in extensive gastric varices in the gastric fundus and cardia. This is almost certainly the source of the patient's bleeding. Additionally, there are small esophageal varices. The primary afferent inflow is via the posterior gastric vein. Additionally, there are contributions from small short gastric veins. No suspicious adenopathy. Reproductive: No mass or other significant abnormality. Other: None. Musculoskeletal: Chronic appearing L1 compression fracture with approximately 40% height loss anteriorly. No significant retropulsion or exaggerated kyphosis. Multilevel degenerative disc disease most noticeable at L4-L5 and L5-S1. No acute  fracture or aggressive appearing lytic or blastic osseous lesion. IMPRESSION: 1. Large gastrorenal shunt resulting in extensive gastric varices. The primary afferent inflow is via the posterior gastric vein with lesser contributions from the short gastric veins. The anatomy is amenable to balloon occluded retrograde transvenous obliteration (BRTO). Please see coronal reformatted images for further anatomic detail. 2. Surprisingly, the liver does not appear overtly cirrhotic. However, the presence of a gastro renal shunt is highly suggestive of underlying cirrhosis. 3. The splenic vein remains patent. 4. Chronic appearing L1 compression fracture with approximately 40% height loss. 5. Multilevel degenerative disc disease. 6. Anemia. Signed, Criselda Peaches, MD [facility] Electronically Signed   By: Jacqulynn Cadet M.D.   On: 04/11/2016 17:30    Scheduled Meds: . Chlorhexidine Gluconate Cloth  6 each Topical Q0600  . cholecalciferol  4,000 Units Oral QHS  . letrozole  2.5 mg Oral Daily  . mupirocin ointment  1 application Nasal BID  . venlafaxine XR  75 mg Oral Daily   Continuous Infusions:      Time spent: 35 minutes    [facility] A  [facility] Pager [phone] If 7PM-7AM, please contact night-coverage at [email or web address], password [facility] 04/12/2016, 3:34 PM  LOS: 1 day

## 2016-04-12 NOTE — Progress Notes (Signed)
Pt admitted to the unit at 1830. Pt mental status is A&O x4. Pt oriented to room, staff, and call bell. Skin is intact except where otherwise charted. Full assessment charted in CH[facility]. Call bell within reach. Visitor guidelines reviewed w/ pt and/or family.  

## 2016-04-13 ENCOUNTER — Encounter (HOSPITAL_COMMUNITY): Payer: Self-pay | Admitting: Anesthesiology

## 2016-04-13 ENCOUNTER — Inpatient Hospital Stay (HOSPITAL_COMMUNITY): Payer: BLUE CROSS/BLUE SHIELD | Admitting: Certified Registered Nurse Anesthetist

## 2016-04-13 ENCOUNTER — Inpatient Hospital Stay (HOSPITAL_COMMUNITY): Payer: BLUE CROSS/BLUE SHIELD

## 2016-04-13 ENCOUNTER — Encounter (HOSPITAL_COMMUNITY)
Admission: AD | Disposition: A | Discharge status: Home / Self Care | Payer: Self-pay | Source: Ambulatory Visit | Attending: Internal Medicine

## 2016-04-13 DIAGNOSIS — I8501 Esophageal varices with bleeding: Principal | ICD-10-CM

## 2016-04-13 DIAGNOSIS — K922 Gastrointestinal hemorrhage, unspecified: Secondary | ICD-10-CM

## 2016-04-13 DIAGNOSIS — C7800 Secondary malignant neoplasm of unspecified lung: Secondary | ICD-10-CM

## 2016-04-13 DIAGNOSIS — C50919 Malignant neoplasm of unspecified site of unspecified female breast: Secondary | ICD-10-CM

## 2016-04-13 DIAGNOSIS — D638 Anemia in other chronic diseases classified elsewhere: Secondary | ICD-10-CM

## 2016-04-13 HISTORY — PX: RADIOLOGY WITH ANESTHESIA: SHX6223

## 2016-04-13 LAB — TYPE AND SCREEN
ABO/RH(D): O POS
Antibody Screen: NEGATIVE

## 2016-04-13 LAB — ANTINUCLEAR ANTIBODIES, IFA: ANTINUCLEAR ANTIBODIES, IFA: NEGATIVE

## 2016-04-13 LAB — ABO/RH: ABO/RH(D): O POS

## 2016-04-13 SURGERY — RADIOLOGY WITH ANESTHESIA
Anesthesia: General

## 2016-04-13 MED ORDER — MIDAZOLAM HCL 5 MG/5ML IJ SOLN
INTRAMUSCULAR | Status: DC | PRN
  Administered 2016-04-13: 1 mg via INTRAVENOUS

## 2016-04-13 MED ORDER — GLYCOPYRROLATE 0.2 MG/ML IJ SOLN
INTRAMUSCULAR | Status: DC | PRN
Start: 2016-04-13 — End: 2016-04-13
  Administered 2016-04-13: 0.6 mg via INTRAVENOUS

## 2016-04-13 MED ORDER — FENTANYL CITRATE (PF) 100 MCG/2ML IJ SOLN
INTRAMUSCULAR | Status: DC | PRN
Start: 2016-04-13 — End: 2016-04-13
  Administered 2016-04-13: 25 ug via INTRAVENOUS
  Administered 2016-04-13: 75 ug via INTRAVENOUS
  Administered 2016-04-13 (×2): 25 ug via INTRAVENOUS
  Administered 2016-04-13: 50 ug via INTRAVENOUS

## 2016-04-13 MED ORDER — DEXTROSE 5 % IV SOLN
10.0000 mg | INTRAVENOUS | Status: DC | PRN
  Administered 2016-04-13: 50 ug/min via INTRAVENOUS

## 2016-04-13 MED ORDER — EPHEDRINE SULFATE 50 MG/ML IJ SOLN
INTRAMUSCULAR | Status: DC | PRN
  Administered 2016-04-13: 5 mg via INTRAVENOUS
  Administered 2016-04-13: 10 mg via INTRAVENOUS

## 2016-04-13 MED ORDER — LIDOCAINE HCL (CARDIAC) 20 MG/ML IV SOLN
INTRAVENOUS | Status: DC | PRN
  Administered 2016-04-13: 20 mg via INTRAVENOUS

## 2016-04-13 MED ORDER — LIDOCAINE HCL 1 % IJ SOLN
INTRAMUSCULAR | Status: AC
  Filled 2016-04-13: qty 20

## 2016-04-13 MED ORDER — ONDANSETRON HCL 4 MG/2ML IJ SOLN
INTRAMUSCULAR | Status: DC | PRN
  Administered 2016-04-13: 4 mg via INTRAVENOUS

## 2016-04-13 MED ORDER — IOPAMIDOL (ISOVUE-300) INJECTION 61%
INTRAVENOUS | Status: AC
  Filled 2016-04-13: qty 200

## 2016-04-13 MED ORDER — FENTANYL CITRATE (PF) 250 MCG/5ML IJ SOLN
INTRAMUSCULAR | Status: AC
  Filled 2016-04-13: qty 5

## 2016-04-13 MED ORDER — MIDAZOLAM HCL 2 MG/2ML IJ SOLN: 0.5000 mg | Freq: Once | INTRAMUSCULAR | Status: DC | PRN

## 2016-04-13 MED ORDER — SODIUM CHLORIDE 0.9 % IV SOLN
INTRAVENOUS | Status: DC | PRN
  Administered 2016-04-13: 11:00:00 via INTRAVENOUS

## 2016-04-13 MED ORDER — ARTIFICIAL TEARS OP OINT
TOPICAL_OINTMENT | OPHTHALMIC | Status: DC | PRN
Start: 2016-04-13 — End: 2016-04-13
  Administered 2016-04-13: 1 via OPHTHALMIC

## 2016-04-13 MED ORDER — FENTANYL CITRATE (PF) 100 MCG/2ML IJ SOLN: 25.0000 ug | INTRAMUSCULAR | Status: DC | PRN

## 2016-04-13 MED ORDER — ROCURONIUM BROMIDE 100 MG/10ML IV SOLN
INTRAVENOUS | Status: DC | PRN
  Administered 2016-04-13: 40 mg via INTRAVENOUS
  Administered 2016-04-13: 10 mg via INTRAVENOUS

## 2016-04-13 MED ORDER — LACTATED RINGERS IV SOLN
INTRAVENOUS | Status: DC | PRN
  Administered 2016-04-13 (×2): via INTRAVENOUS

## 2016-04-13 MED ORDER — IOPAMIDOL (ISOVUE-300) INJECTION 61%
INTRAVENOUS | Status: AC
  Administered 2016-04-13: 150 mL
  Filled 2016-04-13: qty 100

## 2016-04-13 MED ORDER — PROPOFOL 10 MG/ML IV BOLUS
INTRAVENOUS | Status: DC | PRN
  Administered 2016-04-13: 130 mg via INTRAVENOUS

## 2016-04-13 MED ORDER — LIDOCAINE HCL 4 % EX SOLN
CUTANEOUS | Status: DC | PRN
  Administered 2016-04-13: 2 mL via TOPICAL

## 2016-04-13 MED ORDER — MEPERIDINE HCL 25 MG/ML IJ SOLN: 6.2500 mg | INTRAMUSCULAR | Status: DC | PRN

## 2016-04-13 MED ORDER — MIDAZOLAM HCL 2 MG/2ML IJ SOLN
INTRAMUSCULAR | Status: AC
  Filled 2016-04-13: qty 2

## 2016-04-13 MED ORDER — PHENYLEPHRINE HCL 10 MG/ML IJ SOLN
INTRAMUSCULAR | Status: DC | PRN
  Administered 2016-04-13 (×2): 40 ug via INTRAVENOUS

## 2016-04-13 MED ORDER — NEOSTIGMINE METHYLSULFATE 10 MG/10ML IV SOLN
INTRAVENOUS | Status: DC | PRN
  Administered 2016-04-13: 4 mg via INTRAVENOUS

## 2016-04-13 NOTE — Anesthesia Procedure Notes (Signed)
Procedure Name: Intubation Date/Time: 04/13/2016 10:22 AM Performed by: Suzy Bouchard Pre-anesthesia Checklist: Patient identified, Emergency Drugs available, Suction available, Timeout performed and Patient being monitored Patient Re-evaluated:Patient Re-evaluated prior to inductionOxygen Delivery Method: Circle system utilized Preoxygenation: Pre-oxygenation with 100% oxygen Intubation Type: IV induction Ventilation: Mask ventilation without difficulty Laryngoscope Size: Senne and 2 Grade View: Grade I Tube type: Oral Laser Tube: Cuffed inflated with minimal occlusive pressure - saline Tube size: 7.0 mm Number of attempts: 1 Airway Equipment and Method: Stylet and LTA kit utilized Placement Confirmation: ETT inserted through vocal cords under direct vision,  positive ETCO2 and breath sounds checked- equal and bilateral Secured at: 22 cm Tube secured with: Tape Dental Injury: Teeth and Oropharynx as per pre-operative assessment

## 2016-04-13 NOTE — Progress Notes (Addendum)
Pt returned from PACU in stable condition. No bleeding noted. Right Femoral site clean & dry. VSS. Pt A&O X4. Will continue to monitor pt frequently throughout day. No complaints per pt at this time. Bed remains in lowest position and call bell/phone is within reach. Family is at bedside.

## 2016-04-13 NOTE — Transfer of Care (Signed)
Immediate Anesthesia Transfer of Care Note  Patient: Nicole [name]  Procedure(s) Performed: Procedure(s): RADIOLOGY WITH ANESTHESIA (N/A)  Patient Location: PACU  Anesthesia Type:General  Level of Consciousness: awake and alert   Airway & Oxygen Therapy: Patient Spontanous Breathing and Patient connected to nasal cannula oxygen  Post-op Assessment: Report given to RN, Post -op Vital signs reviewed and stable and Patient moving all extremities  Post vital signs: Reviewed and stable  Last Vitals:  Filed Vitals:   04/12/16 2244 04/13/16 0655  BP: 129/65 143/68  Pulse: 77 76  Temp: 36.7 C 36.4 C  Resp: 16 16    Complications: No apparent anesthesia complications

## 2016-04-13 NOTE — Anesthesia Preprocedure Evaluation (Addendum)
Anesthesia Evaluation  Patient identified by MRN, date of birth, ID band Patient awake    Reviewed: Allergy & Precautions, NPO status , Patient's Chart, lab work & pertinent test results  History of Anesthesia Complications Negative for: history of anesthetic complications  Airway Mallampati: II  TM Distance: >3 FB Neck ROM: Full    Dental  (+) Dental Advisory Given, Teeth Intact   Pulmonary  Breast cancer mets to lung   breath sounds clear to auscultation       Cardiovascular (-) angina+ DVT   Rhythm:Regular Rate:Normal  '11 ECHO: normal LVF, EF 60-65%, valves OK   Neuro/Psych negative neurological ROS     GI/Hepatic negative GI ROS, (+) Cirrhosis  (? from chemo and/or XRT?)  Esophageal Varices    , GI bleed   Endo/Other  negative endocrine ROS  Renal/GU negative Renal ROS     Musculoskeletal   Abdominal   Peds  Hematology  (+) Blood dyscrasia (Hb 10.2, plt 65K), ,   Anesthesia Other Findings Breast cancer  Reproductive/Obstetrics                          Anesthesia Physical Anesthesia Plan  ASA: III  Anesthesia Plan: General   Post-op Pain Management:    Induction: Intravenous  Airway Management Planned: Oral ETT  Additional Equipment:   Intra-op Plan:   Post-operative Plan: Extubation in OR  Informed Consent: I have reviewed the patients History and Physical, chart, labs and discussed the procedure including the risks, benefits and alternatives for the proposed anesthesia with the patient or authorized representative who has indicated his/her understanding and acceptance.   Dental advisory given  Plan Discussed with: CRNA and Surgeon  Anesthesia Plan Comments: (Plan routine monitors, GETA Platelet transfusion, possible PRBC transfusion)        Anesthesia Quick Evaluation

## 2016-04-13 NOTE — Procedures (Signed)
Interventional Radiology Procedure Note  Procedure: Balloon Occlusion Retrograde Trans-venous Obliteration (BRTO), coil-assisted, for bleeding gastric varices.  Complications: None Recommendations:  - Immediate recover with anesthesia - Routine wound care with right CFV access. - BRTO protocol contrast-enhanced CT of abdomen on Sunday night or early next week.   - Repeat endoscopy with GI  Signed,  Dulcy Fanny. Earleen Newport, DO

## 2016-04-13 NOTE — Anesthesia Postprocedure Evaluation (Signed)
Anesthesia Post Note  Patient: Nicole [name]  Procedure(s) Performed: Procedure(s) (LRB): RADIOLOGY WITH ANESTHESIA (N/A)  Patient location during evaluation: PACU Anesthesia Type: General Level of consciousness: awake and alert, oriented and patient cooperative Pain management: pain level controlled Vital Signs Assessment: post-procedure vital signs reviewed and stable Respiratory status: spontaneous breathing, nonlabored ventilation and respiratory function stable Cardiovascular status: blood pressure returned to baseline and stable Postop Assessment: no signs of nausea or vomiting Anesthetic complications: no    Last Vitals:  Filed Vitals:   04/13/16 1500 04/13/16 1515  BP: 117/67 114/59  Pulse: 76 80  Temp:  36.7 C  Resp: 14 20    Last Pain:  Filed Vitals:   04/13/16 1518  PainSc: Asleep                 Lindey Renzulli,E. Preeti Winegardner

## 2016-04-13 NOTE — Progress Notes (Addendum)
Patient ID: Nicole [name], female   DOB: [birth year], 60 y.o.   MRN: [id number]  PROGRESS NOTE    Nicole [name]  [id number] DOB: [birth year] DOA: 04/11/2016  PCP: No PCP Per Patient  Outpatient Specialists: Oncology, Dr. Lurline Del   Brief Narrative:  60 y.o. female with a history of breast cancer, s/p chemo and radiation who presented to hospital with melanotic stool, fatigue. She was referred for admission by oncology. GI was consulted and EGD done. EGD showed oozing gastric varices as well as extensive esophageal and gastric varices. A CT scan was then obtained that revealed a large gastrorenal shunt with extensive varices. She was felt to be a good candidate for a BRTO.Pt transferred to [facility] for BRTO.   Assessment & Plan:   Acute upper GI bleed due to esophageal varices ([place]): - Possibly related to chemoradiation therapy - Appreciate GI and IR following - Plan for BRTO today per IR - Monitor for bleeding - Check CBC daily   Breast cancer metastasized to lung ([place]) - Continue Femara   Anemia of chronic disease secondary to antineoplastic therapy / Thrombocytopenia - Sequela of chemo - Monitor CBC daily   DVT prophylaxis: SCD's bilaterally  Code Status: full code  Family Communication: no family at the bedside  Disposition Plan: home once determined stable by GI, IR  Consultants:   GI  IR  Procedures:   BRTO 04/13/2016   Antimicrobials:   None    Subjective: No overnight events.   Objective: Filed Vitals:   04/12/16 2244 04/13/16 0655 04/13/16 1400 04/13/16 1415  BP: 129/65 143/68 117/64 116/71  Pulse: 77 76 91 79  Temp: 98.1 F (36.7 C) 97.5 F (36.4 C) 97.9 F (36.6 C)   TempSrc:      Resp: 16 16 19 16   Height:      Weight:      SpO2: 98% 100% 99% 99%    Intake/Output Summary (Last 24 hours) at 04/13/16 1444 Last data filed at 04/13/16 1413  Gross per 24 hour  Intake   1504 ml  Output    250 ml  Net   1254 ml   Filed Weights   04/11/16 1157 04/11/16 1530  Weight: 64.864 kg (143 lb) 64.864 kg (143 lb)    Examination:  General exam: Appears calm and comfortable  Respiratory system: Clear to auscultation. Respiratory effort normal. Cardiovascular system: S1 & S2 heard, RRR. No JVD, murmurs, rubs, gallops or clicks. No pedal edema. Gastrointestinal system: Abdomen is nondistended, soft and nontender. No organomegaly or masses felt. Normal bowel sounds heard. Central nervous system: Alert and oriented. No focal neurological deficits. Extremities: Symmetric 5 x 5 power. Skin: No rashes, lesions or ulcers Psychiatry: Judgement and insight appear normal. Mood & affect appropriate.   Data Reviewed: I have personally reviewed following labs and imaging studies  CBC:  Recent Labs Lab 04/11/16 0834 04/12/16 0217  WBC 6.2 3.6*  NEUTROABS 3.9  --   HGB 8.4* 10.2*  HCT 24.8* 29.1*  MCV 87.9 88.2  PLT 119* 65*   Basic Metabolic Panel:  Recent Labs Lab 04/11/16 0834 04/12/16 0215  NA 141 140  K 3.7 3.6  CL  --  112*  CO2 23 23  GLUCOSE 91 100*  BUN 33.1* 17  CREATININE 0.9 0.82  CALCIUM 8.3* 7.6*   GFR: Estimated Creatinine Clearance: 62.2 mL/min (by C-G formula based on Cr of 0.82). Liver Function Tests:  Recent Labs Lab 04/11/16 0834  AST 20  ALT 18  ALKPHOS 40  BILITOT 0.78  PROT 5.7*  ALBUMIN 3.3*   No results for input(s): LIPASE, AMYLASE in the last 168 hours. No results for input(s): AMMONIA in the last 168 hours. Coagulation Profile:  Recent Labs Lab 04/12/16 0959  INR 1.06   Cardiac Enzymes: No results for input(s): CKTOTAL, CKMB, CKMBINDEX, TROPONINI in the last 168 hours. BNP (last 3 results) No results for input(s): PROBNP in the last 8760 hours. HbA1C: No results for input(s): HGBA1C in the last 72 hours. CBG: No results for input(s): GLUCAP in the last 168 hours. Lipid Profile: No results for input(s): CHOL, HDL, LDLCALC, TRIG, CHOLHDL, LDLDIRECT in the last 72  hours. Thyroid Function Tests: No results for input(s): TSH, T4TOTAL, FREET4, T3FREE, THYROIDAB in the last 72 hours. Anemia Panel:  Recent Labs  04/11/16 1646  VITAMINB12 202  FOLATE 20.3  FERRITIN 18  TIBC 340  IRON 27*  RETICCTPCT 5.6*   Urine analysis: No results found for: COLORURINE, APPEARANCEUR, LABSPEC, PHURINE, GLUCOSEU, HGBUR, BILIRUBINUR, KETONESUR, PROTEINUR, UROBILINOGEN, NITRITE, LEUKOCYTESUR Sepsis Labs: @LABRCNTIP (procalcitonin:4,lacticidven:4)  Recent Results (from the past 240 hour(s))  MRSA PCR Screening     Status: Abnormal   Collection Time: 04/11/16  5:51 PM  Result Value Ref Range Status   MRSA by PCR POSITIVE (A) NEGATIVE Final    Comment:        The GeneXpert MRSA Assay (FDA approved for NASAL specimens only), is one component of a comprehensive MRSA colonization surveillance program. It is not intended to diagnose MRSA infection nor to guide or monitor treatment for MRSA infections. RESULT CALLED TO, READ BACK BY AND VERIFIED WITH: Michelene Gardener [id number] @ [place]       Radiology Studies: Ct Abdomen Pelvis W Wo Contrast 04/11/2016  1. Large gastrorenal shunt resulting in extensive gastric varices. The primary afferent inflow is via the posterior gastric vein with lesser contributions from the short gastric veins. The anatomy is amenable to balloon occluded retrograde transvenous obliteration (BRTO). Please see coronal reformatted images for further anatomic detail. 2. Surprisingly, the liver does not appear overtly cirrhotic. However, the presence of a gastro renal shunt is highly suggestive of underlying cirrhosis. 3. The splenic vein remains patent. 4. Chronic appearing L1 compression fracture with approximately 40% height loss. 5. Multilevel degenerative disc disease. 6. Anemia. Signed, Criselda Peaches, MD [facility] Radiology Electronically Signed   By: Jacqulynn Cadet M.D.    On: 04/11/2016 17:30     Scheduled Meds: . [MAR Hold] Chlorhexidine Gluconate Cloth  6 each Topical Q0600  . [MAR Hold] cholecalciferol  4,000 Units Oral QHS  . iopamidol      . [MAR Hold] letrozole  2.5 mg Oral Daily  . lidocaine      . [MAR Hold] mupirocin ointment  1 application Nasal BID  . [MAR Hold] venlafaxine XR  75 mg Oral Daily   Continuous Infusions:    LOS: 2 days    Time spent: 25 minutes    Leisa Lenz, MD [facility] Pager [phone]  If 7PM-7AM, please contact night-coverage [email or web address] Password [facility] 04/13/2016, 2:44 PM

## 2016-04-14 ENCOUNTER — Inpatient Hospital Stay (HOSPITAL_COMMUNITY): Payer: BLUE CROSS/BLUE SHIELD

## 2016-04-14 ENCOUNTER — Encounter (HOSPITAL_COMMUNITY): Payer: Self-pay | Admitting: *Deleted

## 2016-04-14 DIAGNOSIS — D500 Iron deficiency anemia secondary to blood loss (chronic): Secondary | ICD-10-CM | POA: Diagnosis not present

## 2016-04-14 DIAGNOSIS — K921 Melena: Secondary | ICD-10-CM | POA: Diagnosis not present

## 2016-04-14 DIAGNOSIS — D62 Acute posthemorrhagic anemia: Secondary | ICD-10-CM

## 2016-04-14 LAB — CBC
HEMATOCRIT: 29.4 % — AB (ref 36.0–46.0)
HEMOGLOBIN: 9.8 g/dL — AB (ref 12.0–15.0)
MCH: 30.1 pg (ref 26.0–34.0)
MCHC: 33.3 g/dL (ref 30.0–36.0)
MCV: 90.2 fL (ref 78.0–100.0)
Platelets: 67 10*3/uL — ABNORMAL LOW (ref 150–400)
RBC: 3.26 MIL/uL — ABNORMAL LOW (ref 3.87–5.11)
RDW: 15.9 % — AB (ref 11.5–15.5)
WBC: 5.1 10*3/uL (ref 4.0–10.5)

## 2016-04-14 LAB — PREPARE PLATELET PHERESIS: Unit division: 0

## 2016-04-14 MED ORDER — IOPAMIDOL (ISOVUE-300) INJECTION 61%
INTRAVENOUS | Status: AC
  Administered 2016-04-14: 100 mL
  Filled 2016-04-14: qty 100

## 2016-04-14 NOTE — Progress Notes (Signed)
Patient ID: Nicole [name], female   DOB: 1956-02-11, 60 y.o.   MRN: [id number]  PROGRESS NOTE    Nicole [name]  [id number] DOB: 09-Nov-1956 DOA: 04/11/2016  PCP: No PCP Per Patient  Outpatient Specialists: Oncology, Dr. Lurline Del   Brief Narrative:  60 y.o. female with a history of breast cancer, s/p chemo and radiation who presented to hospital with melanotic stool, fatigue. She was referred for admission by oncology. GI was consulted and EGD done. EGD showed oozing gastric varices as well as extensive esophageal and gastric varices. A CT scan was then obtained that revealed a large gastrorenal shunt with extensive varices. She was felt to be a good candidate for a BRTO.Pt transferred to [facility] for BRTO.   Assessment & Plan:   Acute upper GI bleed due to esophageal varices ([place]): - Possibly related to chemoradiation therapy - Appreciate GI and IR following - S/p BRTO 4/16 - No bleeding - Check CBC today   Breast cancer metastasized to lung ([facility]) - Continue Femara   Anemia of chronic disease secondary to antineoplastic therapy / Thrombocytopenia - Sequela of chemo - Check CBC this am  DVT prophylaxis: SCD's bilaterally  Code Status: full code  Family Communication: no family at the bedside  Disposition Plan: home 4/17  Consultants:   GI  IR  Procedures:   BRTO 04/13/2016   Antimicrobials:   None    Subjective: No overnight events. No reports of bleeding.  Objective: Filed Vitals:   04/13/16 1515 04/13/16 1540 04/13/16 2120 04/14/16 0507  BP: 114/59 126/67 124/63 100/57  Pulse: 80 82 91 79  Temp: 98 F (36.7 C) 98.2 F (36.8 C) 99.1 F (37.3 C) 98.6 F (37 C)  TempSrc:      Resp: 20  18 18   Height:      Weight:      SpO2: 100% 100% 99% 95%    Intake/Output Summary (Last 24 hours) at 04/14/16 1315 Last data filed at 04/14/16 0458  Gross per 24 hour  Intake   1200 ml  Output   1450 ml  Net   -250 ml   Filed Weights   04/11/16 1157  04/11/16 1530  Weight: 64.864 kg (143 lb) 64.864 kg (143 lb)    Examination:  General exam: Appears calm and comfortable without distress Respiratory system: Respiratory effort normal. No wheezing  Cardiovascular system: S1 & S2 (+), Rate controlled. No JVD. No pedal edema. Gastrointestinal system: (+) BS, non tender, non distended  Central nervous system: non focal , alert and awake  Extremities: Strength 5/5; no cyanosis Skin: No rashes, warm and dry  Psychiatry: Normal mood, normal behavior   Data Reviewed: I have personally reviewed following labs and imaging studies  CBC:  Recent Labs Lab 04/11/16 0834 04/12/16 0217  WBC 6.2 3.6*  NEUTROABS 3.9  --   HGB 8.4* 10.2*  HCT 24.8* 29.1*  MCV 87.9 88.2  PLT 119* 65*   Basic Metabolic Panel:  Recent Labs Lab 04/11/16 0834 04/12/16 0215  NA 141 140  K 3.7 3.6  CL  --  112*  CO2 23 23  GLUCOSE 91 100*  BUN 33.1* 17  CREATININE 0.9 0.82  CALCIUM 8.3* 7.6*   GFR: Estimated Creatinine Clearance: 62.2 mL/min (by C-G formula based on Cr of 0.82). Liver Function Tests:  Recent Labs Lab 04/11/16 0834  AST 20  ALT 18  ALKPHOS 40  BILITOT 0.78  PROT 5.7*  ALBUMIN 3.3*   No results for  input(s): LIPASE, AMYLASE in the last 168 hours. No results for input(s): AMMONIA in the last 168 hours. Coagulation Profile:  Recent Labs Lab 04/12/16 0959  INR 1.06   Cardiac Enzymes: No results for input(s): CKTOTAL, CKMB, CKMBINDEX, TROPONINI in the last 168 hours. BNP (last 3 results) No results for input(s): PROBNP in the last 8760 hours. HbA1C: No results for input(s): HGBA1C in the last 72 hours. CBG: No results for input(s): GLUCAP in the last 168 hours. Lipid Profile: No results for input(s): CHOL, HDL, LDLCALC, TRIG, CHOLHDL, LDLDIRECT in the last 72 hours. Thyroid Function Tests: No results for input(s): TSH, T4TOTAL, FREET4, T3FREE, THYROIDAB in the last 72 hours. Anemia Panel:  Recent Labs   04/11/16 1646  VITAMINB12 202  FOLATE 20.3  FERRITIN 18  TIBC 340  IRON 27*  RETICCTPCT 5.6*   Urine analysis: No results found for: COLORURINE, APPEARANCEUR, LABSPEC, PHURINE, GLUCOSEU, HGBUR, BILIRUBINUR, KETONESUR, PROTEINUR, UROBILINOGEN, NITRITE, LEUKOCYTESUR Sepsis Labs: @LABRCNTIP (procalcitonin:4,lacticidven:4)  Recent Results (from the past 240 hour(s))  MRSA PCR Screening     Status: Abnormal   Collection Time: 04/11/16  5:51 PM  Result Value Ref Range Status   MRSA by PCR POSITIVE (A) NEGATIVE Final    Comment:        The GeneXpert MRSA Assay (FDA approved for NASAL specimens only), is one component of a comprehensive MRSA colonization surveillance program. It is not intended to diagnose MRSA infection nor to guide or monitor treatment for MRSA infections. RESULT CALLED TO, READ BACK BY AND VERIFIED WITH: Michelene Gardener [id number] @ [place]       Radiology Studies: Ct Abdomen Pelvis W Wo Contrast 04/11/2016  1. Large gastrorenal shunt resulting in extensive gastric varices. The primary afferent inflow is via the posterior gastric vein with lesser contributions from the short gastric veins. The anatomy is amenable to balloon occluded retrograde transvenous obliteration (BRTO). Please see coronal reformatted images for further anatomic detail. 2. Surprisingly, the liver does not appear overtly cirrhotic. However, the presence of a gastro renal shunt is highly suggestive of underlying cirrhosis. 3. The splenic vein remains patent. 4. Chronic appearing L1 compression fracture with approximately 40% height loss. 5. Multilevel degenerative disc disease. 6. Anemia. Signed, Criselda Peaches, MD [facility] Radiology Electronically Signed   By: Jacqulynn Cadet M.D.   On: 04/11/2016 17:30     Scheduled Meds: . Chlorhexidine Gluconate Cloth  6 each Topical Q0600  . cholecalciferol  4,000 Units Oral QHS  .  iopamidol      . letrozole  2.5 mg Oral Daily  . mupirocin ointment  1 application Nasal BID  . venlafaxine XR  75 mg Oral Daily   Continuous Infusions:    LOS: 3 days    Time spent: 25 minutes    Nicole Lenz, MD [facility] Pager [phone]  If 7PM-7AM, please contact night-coverage [email or web address] Password [email or web address] 04/14/2016, 1:15 PM

## 2016-04-14 NOTE — Progress Notes (Signed)
Referring Physician(s): Dr Lizbeth Bark  Supervising Physician: Corrie Mckusick  Chief Complaint:  Bleeding Gastric varices   Subjective:  Procedure: Balloon Occlusion Retrograde Trans-venous Obliteration (BRTO), coil-assisted, for bleeding gastric varices.  Complications: None Recommendations:  - Immediate recover with anesthesia - Routine wound care with right CFV access. - BRTO protocol contrast-enhanced CT of abdomen on Sunday night or early next week.  - Repeat endoscopy with GI  Pt up in bed Feels great Has not had any bleeding No complaints No pain  Allergies: Review of patient's allergies indicates no known allergies.  Medications: Prior to Admission medications   Medication Sig Start Date End Date Taking? Authorizing Provider  alendronate (FOSAMAX) 70 MG tablet TAKE 1 TABLET BY MOUTH ONCE A WEEK 08/22/15  Yes Chauncey Cruel, MD  Calcium Carbonate (CALTRATE 600 PO) Take by mouth daily.    Yes Historical Provider, MD  Cholecalciferol (VITAMIN D PO) Take 4,000 Units by mouth daily.    Yes Historical Provider, MD  letrozole (FEMARA) 2.5 MG tablet Take 1 tablet (2.5 mg total) by mouth daily. 11/01/15  Yes Chauncey Cruel, MD  venlafaxine XR (EFFEXOR-XR) 75 MG 24 hr capsule Take 1 capsule (75 mg total) by mouth daily. 11/01/15  Yes Chauncey Cruel, MD     Vital Signs: BP 100/57 mmHg  Pulse 79  Temp(Src) 98.6 F (37 C) (Oral)  Resp 18  Ht 5\' 4"  (1.626 m)  Wt 143 lb (64.864 kg)  BMI 24.53 kg/m2  SpO2 95%  Physical Exam  Constitutional: She is oriented to person, place, and time. She appears well-developed.  Cardiovascular: Normal rate and regular rhythm.   Pulmonary/Chest: Effort normal and breath sounds normal.  Abdominal: Soft. Bowel sounds are normal.  Musculoskeletal: Normal range of motion.  Neurological: She is alert and oriented to person, place, and time.  Skin: Skin is warm and dry.  Rt groin site clean and dry  NT  No hematoma Rt foot: 2+  pulses  Psychiatric: She has a normal mood and affect. Her behavior is normal. Judgment and thought content normal.  Nursing note and vitals reviewed.   Imaging: Ct Abdomen Pelvis W Wo Contrast  04/11/2016  CLINICAL DATA:  60 year old female with anemia and gastric varices on endoscopy. Evaluate for BRTO candidacy. EXAM: CT ABDOMEN AND PELVIS WITHOUT AND WITH CONTRAST TECHNIQUE: Multidetector CT imaging of the abdomen and pelvis was performed following the standard protocol before and following the bolus administration of intravenous contrast. CONTRAST:  153mL ISOVUE-300 IOPAMIDOL (ISOVUE-300) INJECTION 61% COMPARISON:  Prior CT scan of the abdomen and pelvis 12/02/2012 FINDINGS: Lower chest: No acute findings. The intracardiac blood pool is hypodense relative to the adjacent myocardium consistent with anemia. Hepatobiliary: The liver does not appear overtly cirrhotic. There is no discrete solid hepatic lesion. Stable cyst in the posterior aspect of hepatic segment 6. The portal vein is normal in size. The hepatic veins are normal in size and patent. Please see vascular section below for further detail. Gallbladder is unremarkable. No intra or extrahepatic biliary ductal dilatation. Pancreas: No mass, inflammatory changes, or other significant abnormality. Spleen: Within normal limits in size and appearance. Adrenals/Urinary Tract: No masses identified. No evidence of hydronephrosis. Stomach/Bowel: No evidence of obstruction, inflammatory process, or abnormal fluid collections. Vascular/Lymphatic: Large gastrorenal shunt resulting in extensive gastric varices in the gastric fundus and cardia. This is almost certainly the source of the patient's bleeding. Additionally, there are small esophageal varices. The primary afferent inflow is via the posterior  gastric vein. Additionally, there are contributions from small short gastric veins. No suspicious adenopathy. Reproductive: No mass or other significant  abnormality. Other: None. Musculoskeletal: Chronic appearing L1 compression fracture with approximately 40% height loss anteriorly. No significant retropulsion or exaggerated kyphosis. Multilevel degenerative disc disease most noticeable at L4-L5 and L5-S1. No acute fracture or aggressive appearing lytic or blastic osseous lesion. IMPRESSION: 1. Large gastrorenal shunt resulting in extensive gastric varices. The primary afferent inflow is via the posterior gastric vein with lesser contributions from the short gastric veins. The anatomy is amenable to balloon occluded retrograde transvenous obliteration (BRTO). Please see coronal reformatted images for further anatomic detail. 2. Surprisingly, the liver does not appear overtly cirrhotic. However, the presence of a gastro renal shunt is highly suggestive of underlying cirrhosis. 3. The splenic vein remains patent. 4. Chronic appearing L1 compression fracture with approximately 40% height loss. 5. Multilevel degenerative disc disease. 6. Anemia. Signed, Criselda Peaches, MD [facility] Radiology Electronically Signed   By: Jacqulynn Cadet M.D.   On: 04/11/2016 17:30   Ir Angiogram Selective Each Additional Vessel  04/13/2016  CLINICAL DATA:  60 year old female with a history of bleeding gastric varices. She has no significant ascites and low risk esophageal varices. Balloon occluded retrograde trans venous obliteration is indicated as the solution for bleeding gastric varices. EXAM: IR EMBO ART VEN HEMORR LYMPH EXTRAV INC GUIDE ROADMAPPING; ADDITIONAL ARTERIOGRAPHY; IR ULTRASOUND GUIDANCE VASC ACCESS RIGHT; LEFT RENAL VENOGRAPHY; ARTERIOGRAPHY MEDICATIONS: No additional ANESTHESIA/SEDATION: General - as administered by the Anesthesia department CONTRAST:  1 ISOVUE-300 IOPAMIDOL (ISOVUE-300) INJECTION 61% FLUOROSCOPY TIME:  Fluoroscopy Time: 49 minutes 0 seconds COMPLICATIONS: None PROCEDURE: Informed written  consent was obtained from the patient and the patient's family after a thorough discussion of the procedural risks, benefits and alternatives. Specific risks that were addressed regarding BRTO include, bleeding, infection, contrast reaction, kidney injury, need for further procedure or surgery including TIPS and/ or endoscopy, pulmonary embolism, stroke, worsening of ascites, worsening of esophageal varices, paradoxical liver failure, varix rupture, cardiopulmonary collapse, death. All questions were addressed. Maximal Sterile Barrier Technique was utilized including caps, mask, sterile gowns, sterile gloves, sterile drape, hand hygiene and skin antiseptic. A timeout was performed prior to the initiation of the procedure. Ultrasound survey of the right inguinal region was performed with images stored and sent to PACs. A single wall puncture needle was used access the right common femoral artery under ultrasound guidance with saline syringe. With venous blood flow returned, a Bentson wire was observed to enter the common femoral vein and the IVC under fluoroscopy. The needle was removed, and a small incision was made, and 9 [place] dilator was passed over the Bentson wire. A 10 French TIPS sheath was then passed over the Bentson wire after manually creating a significant leftward current on the catheter. The inner dilator and wire were removed, and a 5 [place] cobra catheter was passed over the Bentson wire into the IVC. Cobra catheter was used to select the left renal vein orifice. Combination of the Bentson wire, Glidewire, Rosen wire were used to gain purchased into the renal vein, with renal vein limited venogram performed to identify the in flowing gastro renal shunt. With a safety curve on a stiff Amplatz wire position into superior pole renal vein, the TIPS sheath was advanced into the left renal vein over the Cobra catheter. Pullback reinforced straight sheath selection technique (PRESSS) was performed, and as  the tip sheath was withdrawn, contrast was used  to identified the inflow of the gastro renal shunt. With the coaxial Amplatz wire in position, a combination of a standard Kumpe the catheter and a 4 French angled glide catheter were used to cannulate the orifice of the gastro renal shunt, and 11 o'clock position from the in flowing shunt opposite the adrenal vein inflow. Once the Glidewire was advanced into the shunt, the glide catheter was advanced, and the Glidewire was exchanged for a stiff Amplatz wire with a safety curve on the tip. The renal vein buddy wire was withdrawn, and the TIPS sheath was advanced into the gastro renal shunt. The glide catheter was withdrawn and then a balloon occlusion catheter was advanced over the Amplatz wire into the gastro renal shunt. Amplatz wire was then withdrawn. Angiogram was then performed to confirmed location within the gastro renal shunt outflow. Balloon was inflated on the balloon occlusion catheter and was withdrawn to the first encountered web to achieve hemostasis. Standard angiogram was performed.  CO2 angiogram was also performed. A penumbra Lantern microcatheter and a fathom wire were navigated into the shunt. Once we confirmed location within the outflow with standard angiogram, treatment was initiated. A foamed solution of detergent/sclerosant was mixed in a standard Tessari method, with 1:2:3 ratio of ethiodized oil: sodium tetradecyl sulfate: Room air. The sclerosed sent was infused under fluoroscopic guidance, with observation of reflux into the afferent veins. Once reflux was identified into the posterior gastric vein, sclerosing was completed. The micro catheter was withdrawn to the tip of the balloon occlusion catheter, and coil mass was deposited with deployment of multiple Ruby coils. 4 standard Ruby coils and 1 soft ruby coil deployed. After 20 minutes, the balloon was deflated under fluoroscopy to observe the foam mass. Once we were confident there was  no migration, balloon was withdrawn and the sheath was withdrawn. Hemostasis was achieved with manual compression. Patient remained hemodynamically stable throughout. No complications were encountered and no significant blood loss was encountered. IMPRESSION: Status post coil assisted balloon occluded retrograde trans venous obliteration (BRTO) for treatment of bleeding gastric varices. Signed, Dulcy Fanny. Earleen Newport, DO [facility] Radiology Electronically Signed   By: Corrie Mckusick D.O.   On: 04/13/2016 15:06   Ir Venogram Renal Uni Left  04/13/2016  CLINICAL DATA:  60 year old female with a history of bleeding gastric varices. She has no significant ascites and low risk esophageal varices. Balloon occluded retrograde trans venous obliteration is indicated as the solution for bleeding gastric varices. EXAM: IR EMBO ART VEN HEMORR LYMPH EXTRAV INC GUIDE ROADMAPPING; ADDITIONAL ARTERIOGRAPHY; IR ULTRASOUND GUIDANCE VASC ACCESS RIGHT; LEFT RENAL VENOGRAPHY; ARTERIOGRAPHY MEDICATIONS: No additional ANESTHESIA/SEDATION: General - as administered by the Anesthesia department CONTRAST:  1 ISOVUE-300 IOPAMIDOL (ISOVUE-300) INJECTION 61% FLUOROSCOPY TIME:  Fluoroscopy Time: 49 minutes 0 seconds COMPLICATIONS: None PROCEDURE: Informed written consent was obtained from the patient and the patient's family after a thorough discussion of the procedural risks, benefits and alternatives. Specific risks that were addressed regarding BRTO include, bleeding, infection, contrast reaction, kidney injury, need for further procedure or surgery including TIPS and/ or endoscopy, pulmonary embolism, stroke, worsening of ascites, worsening of esophageal varices, paradoxical liver failure, varix rupture, cardiopulmonary collapse, death. All questions were addressed. Maximal Sterile Barrier Technique was utilized including caps, mask, sterile gowns, sterile gloves, sterile drape, hand hygiene and  skin antiseptic. A timeout was performed prior to the initiation of the procedure. Ultrasound survey of the right inguinal region was performed with images stored and sent to  PACs. A single wall puncture needle was used access the right common femoral artery under ultrasound guidance with saline syringe. With venous blood flow returned, a Bentson wire was observed to enter the common femoral vein and the IVC under fluoroscopy. The needle was removed, and a small incision was made, and 9 [place] dilator was passed over the Bentson wire. A 10 French TIPS sheath was then passed over the Bentson wire after manually creating a significant leftward current on the catheter. The inner dilator and wire were removed, and a 5 [place] cobra catheter was passed over the Bentson wire into the IVC. Cobra catheter was used to select the left renal vein orifice. Combination of the Bentson wire, Glidewire, Rosen wire were used to gain purchased into the renal vein, with renal vein limited venogram performed to identify the in flowing gastro renal shunt. With a safety curve on a stiff Amplatz wire position into superior pole renal vein, the TIPS sheath was advanced into the left renal vein over the Cobra catheter. Pullback reinforced straight sheath selection technique (PRESSS) was performed, and as the tip sheath was withdrawn, contrast was used to identified the inflow of the gastro renal shunt. With the coaxial Amplatz wire in position, a combination of a standard Kumpe the catheter and a 4 French angled glide catheter were used to cannulate the orifice of the gastro renal shunt, and 11 o'clock position from the in flowing shunt opposite the adrenal vein inflow. Once the Glidewire was advanced into the shunt, the glide catheter was advanced, and the Glidewire was exchanged for a stiff Amplatz wire with a safety curve on the tip. The renal vein buddy wire was withdrawn, and the TIPS sheath was advanced into the gastro renal shunt. The  glide catheter was withdrawn and then a balloon occlusion catheter was advanced over the Amplatz wire into the gastro renal shunt. Amplatz wire was then withdrawn. Angiogram was then performed to confirmed location within the gastro renal shunt outflow. Balloon was inflated on the balloon occlusion catheter and was withdrawn to the first encountered web to achieve hemostasis. Standard angiogram was performed.  CO2 angiogram was also performed. A penumbra Lantern microcatheter and a fathom wire were navigated into the shunt. Once we confirmed location within the outflow with standard angiogram, treatment was initiated. A foamed solution of detergent/sclerosant was mixed in a standard Tessari method, with 1:2:3 ratio of ethiodized oil: sodium tetradecyl sulfate: Room air. The sclerosed sent was infused under fluoroscopic guidance, with observation of reflux into the afferent veins. Once reflux was identified into the posterior gastric vein, sclerosing was completed. The micro catheter was withdrawn to the tip of the balloon occlusion catheter, and coil mass was deposited with deployment of multiple Ruby coils. 4 standard Ruby coils and 1 soft ruby coil deployed. After 20 minutes, the balloon was deflated under fluoroscopy to observe the foam mass. Once we were confident there was no migration, balloon was withdrawn and the sheath was withdrawn. Hemostasis was achieved with manual compression. Patient remained hemodynamically stable throughout. No complications were encountered and no significant blood loss was encountered. IMPRESSION: Status post coil assisted balloon occluded retrograde trans venous obliteration (BRTO) for treatment of bleeding gastric varices. Signed, Dulcy Fanny. Earleen Newport, DO [facility] Radiology Electronically Signed   By: Corrie Mckusick D.O.   On: 04/13/2016 15:06   Ir Angiogram Follow Up Study  04/13/2016  CLINICAL DATA:  60 year old female with a  history of bleeding gastric varices. She  has no significant ascites and low risk esophageal varices. Balloon occluded retrograde trans venous obliteration is indicated as the solution for bleeding gastric varices. EXAM: IR EMBO ART VEN HEMORR LYMPH EXTRAV INC GUIDE ROADMAPPING; ADDITIONAL ARTERIOGRAPHY; IR ULTRASOUND GUIDANCE VASC ACCESS RIGHT; LEFT RENAL VENOGRAPHY; ARTERIOGRAPHY MEDICATIONS: No additional ANESTHESIA/SEDATION: General - as administered by the Anesthesia department CONTRAST:  1 ISOVUE-300 IOPAMIDOL (ISOVUE-300) INJECTION 61% FLUOROSCOPY TIME:  Fluoroscopy Time: 49 minutes 0 seconds COMPLICATIONS: None PROCEDURE: Informed written consent was obtained from the patient and the patient's family after a thorough discussion of the procedural risks, benefits and alternatives. Specific risks that were addressed regarding BRTO include, bleeding, infection, contrast reaction, kidney injury, need for further procedure or surgery including TIPS and/ or endoscopy, pulmonary embolism, stroke, worsening of ascites, worsening of esophageal varices, paradoxical liver failure, varix rupture, cardiopulmonary collapse, death. All questions were addressed. Maximal Sterile Barrier Technique was utilized including caps, mask, sterile gowns, sterile gloves, sterile drape, hand hygiene and skin antiseptic. A timeout was performed prior to the initiation of the procedure. Ultrasound survey of the right inguinal region was performed with images stored and sent to PACs. A single wall puncture needle was used access the right common femoral artery under ultrasound guidance with saline syringe. With venous blood flow returned, a Bentson wire was observed to enter the common femoral vein and the IVC under fluoroscopy. The needle was removed, and a small incision was made, and 9 [place] dilator was passed over the Bentson wire. A 10 French TIPS sheath was then passed over the Bentson wire after manually creating a significant  leftward current on the catheter. The inner dilator and wire were removed, and a 5 [place] cobra catheter was passed over the Bentson wire into the IVC. Cobra catheter was used to select the left renal vein orifice. Combination of the Bentson wire, Glidewire, Rosen wire were used to gain purchased into the renal vein, with renal vein limited venogram performed to identify the in flowing gastro renal shunt. With a safety curve on a stiff Amplatz wire position into superior pole renal vein, the TIPS sheath was advanced into the left renal vein over the Cobra catheter. Pullback reinforced straight sheath selection technique (PRESSS) was performed, and as the tip sheath was withdrawn, contrast was used to identified the inflow of the gastro renal shunt. With the coaxial Amplatz wire in position, a combination of a standard Kumpe the catheter and a 4 French angled glide catheter were used to cannulate the orifice of the gastro renal shunt, and 11 o'clock position from the in flowing shunt opposite the adrenal vein inflow. Once the Glidewire was advanced into the shunt, the glide catheter was advanced, and the Glidewire was exchanged for a stiff Amplatz wire with a safety curve on the tip. The renal vein buddy wire was withdrawn, and the TIPS sheath was advanced into the gastro renal shunt. The glide catheter was withdrawn and then a balloon occlusion catheter was advanced over the Amplatz wire into the gastro renal shunt. Amplatz wire was then withdrawn. Angiogram was then performed to confirmed location within the gastro renal shunt outflow. Balloon was inflated on the balloon occlusion catheter and was withdrawn to the first encountered web to achieve hemostasis. Standard angiogram was performed.  CO2 angiogram was also performed. A penumbra Lantern microcatheter and a fathom wire were navigated into the shunt. Once we confirmed location within the outflow with standard angiogram, treatment was initiated. A foamed  solution of detergent/sclerosant was mixed in a  standard Tessari method, with 1:2:3 ratio of ethiodized oil: sodium tetradecyl sulfate: Room air. The sclerosed sent was infused under fluoroscopic guidance, with observation of reflux into the afferent veins. Once reflux was identified into the posterior gastric vein, sclerosing was completed. The micro catheter was withdrawn to the tip of the balloon occlusion catheter, and coil mass was deposited with deployment of multiple Ruby coils. 4 standard Ruby coils and 1 soft ruby coil deployed. After 20 minutes, the balloon was deflated under fluoroscopy to observe the foam mass. Once we were confident there was no migration, balloon was withdrawn and the sheath was withdrawn. Hemostasis was achieved with manual compression. Patient remained hemodynamically stable throughout. No complications were encountered and no significant blood loss was encountered. IMPRESSION: Status post coil assisted balloon occluded retrograde trans venous obliteration (BRTO) for treatment of bleeding gastric varices. Signed, Dulcy Fanny. Earleen Newport, DO [facility] Radiology Electronically Signed   By: Corrie Mckusick D.O.   On: 04/13/2016 15:06   Ir US Guide Vasc Access Right  04/13/2016  CLINICAL DATA:  60 year old female with a history of bleeding gastric varices. She has no significant ascites and low risk esophageal varices. Balloon occluded retrograde trans venous obliteration is indicated as the solution for bleeding gastric varices. EXAM: IR EMBO ART VEN HEMORR LYMPH EXTRAV INC GUIDE ROADMAPPING; ADDITIONAL ARTERIOGRAPHY; IR ULTRASOUND GUIDANCE VASC ACCESS RIGHT; LEFT RENAL VENOGRAPHY; ARTERIOGRAPHY MEDICATIONS: No additional ANESTHESIA/SEDATION: General - as administered by the Anesthesia department CONTRAST:  1 ISOVUE-300 IOPAMIDOL (ISOVUE-300) INJECTION 61% FLUOROSCOPY TIME:  Fluoroscopy Time: 49 minutes 0 seconds COMPLICATIONS: None PROCEDURE:  Informed written consent was obtained from the patient and the patient's family after a thorough discussion of the procedural risks, benefits and alternatives. Specific risks that were addressed regarding BRTO include, bleeding, infection, contrast reaction, kidney injury, need for further procedure or surgery including TIPS and/ or endoscopy, pulmonary embolism, stroke, worsening of ascites, worsening of esophageal varices, paradoxical liver failure, varix rupture, cardiopulmonary collapse, death. All questions were addressed. Maximal Sterile Barrier Technique was utilized including caps, mask, sterile gowns, sterile gloves, sterile drape, hand hygiene and skin antiseptic. A timeout was performed prior to the initiation of the procedure. Ultrasound survey of the right inguinal region was performed with images stored and sent to PACs. A single wall puncture needle was used access the right common femoral artery under ultrasound guidance with saline syringe. With venous blood flow returned, a Bentson wire was observed to enter the common femoral vein and the IVC under fluoroscopy. The needle was removed, and a small incision was made, and 9 [place] dilator was passed over the Bentson wire. A 10 French TIPS sheath was then passed over the Bentson wire after manually creating a significant leftward current on the catheter. The inner dilator and wire were removed, and a 5 [place] cobra catheter was passed over the Bentson wire into the IVC. Cobra catheter was used to select the left renal vein orifice. Combination of the Bentson wire, Glidewire, Rosen wire were used to gain purchased into the renal vein, with renal vein limited venogram performed to identify the in flowing gastro renal shunt. With a safety curve on a stiff Amplatz wire position into superior pole renal vein, the TIPS sheath was advanced into the left renal vein over the Cobra catheter. Pullback reinforced straight sheath selection technique (PRESSS) was  performed, and as the tip sheath was withdrawn, contrast was used to identified the inflow of the gastro renal shunt. With the coaxial  Amplatz wire in position, a combination of a standard Kumpe the catheter and a 4 French angled glide catheter were used to cannulate the orifice of the gastro renal shunt, and 11 o'clock position from the in flowing shunt opposite the adrenal vein inflow. Once the Glidewire was advanced into the shunt, the glide catheter was advanced, and the Glidewire was exchanged for a stiff Amplatz wire with a safety curve on the tip. The renal vein buddy wire was withdrawn, and the TIPS sheath was advanced into the gastro renal shunt. The glide catheter was withdrawn and then a balloon occlusion catheter was advanced over the Amplatz wire into the gastro renal shunt. Amplatz wire was then withdrawn. Angiogram was then performed to confirmed location within the gastro renal shunt outflow. Balloon was inflated on the balloon occlusion catheter and was withdrawn to the first encountered web to achieve hemostasis. Standard angiogram was performed.  CO2 angiogram was also performed. A penumbra Lantern microcatheter and a fathom wire were navigated into the shunt. Once we confirmed location within the outflow with standard angiogram, treatment was initiated. A foamed solution of detergent/sclerosant was mixed in a standard Tessari method, with 1:2:3 ratio of ethiodized oil: sodium tetradecyl sulfate: Room air. The sclerosed sent was infused under fluoroscopic guidance, with observation of reflux into the afferent veins. Once reflux was identified into the posterior gastric vein, sclerosing was completed. The micro catheter was withdrawn to the tip of the balloon occlusion catheter, and coil mass was deposited with deployment of multiple Ruby coils. 4 standard Ruby coils and 1 soft ruby coil deployed. After 20 minutes, the balloon was deflated under fluoroscopy to observe the foam mass. Once we were  confident there was no migration, balloon was withdrawn and the sheath was withdrawn. Hemostasis was achieved with manual compression. Patient remained hemodynamically stable throughout. No complications were encountered and no significant blood loss was encountered. IMPRESSION: Status post coil assisted balloon occluded retrograde trans venous obliteration (BRTO) for treatment of bleeding gastric varices. Signed, Dulcy Fanny. Earleen Newport, DO [facility] Radiology Electronically Signed   By: Corrie Mckusick D.O.   On: 04/13/2016 15:06   [place] Guide Roadmapping  04/13/2016  CLINICAL DATA:  60 year old female with a history of bleeding gastric varices. She has no significant ascites and low risk esophageal varices. Balloon occluded retrograde trans venous obliteration is indicated as the solution for bleeding gastric varices. EXAM: IR EMBO ART VEN HEMORR LYMPH EXTRAV INC GUIDE ROADMAPPING; ADDITIONAL ARTERIOGRAPHY; IR ULTRASOUND GUIDANCE VASC ACCESS RIGHT; LEFT RENAL VENOGRAPHY; ARTERIOGRAPHY MEDICATIONS: No additional ANESTHESIA/SEDATION: General - as administered by the Anesthesia department CONTRAST:  1 ISOVUE-300 IOPAMIDOL (ISOVUE-300) INJECTION 61% FLUOROSCOPY TIME:  Fluoroscopy Time: 49 minutes 0 seconds COMPLICATIONS: None PROCEDURE: Informed written consent was obtained from the patient and the patient's family after a thorough discussion of the procedural risks, benefits and alternatives. Specific risks that were addressed regarding BRTO include, bleeding, infection, contrast reaction, kidney injury, need for further procedure or surgery including TIPS and/ or endoscopy, pulmonary embolism, stroke, worsening of ascites, worsening of esophageal varices, paradoxical liver failure, varix rupture, cardiopulmonary collapse, death. All questions were addressed. Maximal Sterile Barrier Technique was utilized including caps, mask, sterile  gowns, sterile gloves, sterile drape, hand hygiene and skin antiseptic. A timeout was performed prior to the initiation of the procedure. Ultrasound survey of the right inguinal region was performed with images stored and sent to PACs. A single wall puncture  needle was used access the right common femoral artery under ultrasound guidance with saline syringe. With venous blood flow returned, a Bentson wire was observed to enter the common femoral vein and the IVC under fluoroscopy. The needle was removed, and a small incision was made, and 9 [place] dilator was passed over the Bentson wire. A 10 French TIPS sheath was then passed over the Bentson wire after manually creating a significant leftward current on the catheter. The inner dilator and wire were removed, and a 5 [place] cobra catheter was passed over the Bentson wire into the IVC. Cobra catheter was used to select the left renal vein orifice. Combination of the Bentson wire, Glidewire, Rosen wire were used to gain purchased into the renal vein, with renal vein limited venogram performed to identify the in flowing gastro renal shunt. With a safety curve on a stiff Amplatz wire position into superior pole renal vein, the TIPS sheath was advanced into the left renal vein over the Cobra catheter. Pullback reinforced straight sheath selection technique (PRESSS) was performed, and as the tip sheath was withdrawn, contrast was used to identified the inflow of the gastro renal shunt. With the coaxial Amplatz wire in position, a combination of a standard [place] the catheter and a 4 French angled glide catheter were used to cannulate the orifice of the gastro renal shunt, and 11 o'clock position from the in flowing shunt opposite the adrenal vein inflow. Once the Glidewire was advanced into the shunt, the glide catheter was advanced, and the Glidewire was exchanged for a stiff Amplatz wire with a safety curve on the tip. The renal vein buddy wire was withdrawn, and the  TIPS sheath was advanced into the gastro renal shunt. The glide catheter was withdrawn and then a balloon occlusion catheter was advanced over the Amplatz wire into the gastro renal shunt. Amplatz wire was then withdrawn. Angiogram was then performed to confirmed location within the gastro renal shunt outflow. Balloon was inflated on the balloon occlusion catheter and was withdrawn to the first encountered web to achieve hemostasis. Standard angiogram was performed.  CO2 angiogram was also performed. A penumbra Lantern microcatheter and a fathom wire were navigated into the shunt. Once we confirmed location within the outflow with standard angiogram, treatment was initiated. A foamed solution of detergent/sclerosant was mixed in a standard Tessari method, with 1:2:3 ratio of ethiodized oil: sodium tetradecyl sulfate: Room air. The sclerosed sent was infused under fluoroscopic guidance, with observation of reflux into the afferent veins. Once reflux was identified into the posterior gastric vein, sclerosing was completed. The micro catheter was withdrawn to the tip of the balloon occlusion catheter, and coil mass was deposited with deployment of multiple Ruby coils. 4 standard Ruby coils and 1 soft ruby coil deployed. After 20 minutes, the balloon was deflated under fluoroscopy to observe the foam mass. Once we were confident there was no migration, balloon was withdrawn and the sheath was withdrawn. Hemostasis was achieved with manual compression. Patient remained hemodynamically stable throughout. No complications were encountered and no significant blood loss was encountered. IMPRESSION: Status post coil assisted balloon occluded retrograde trans venous obliteration (BRTO) for treatment of bleeding gastric varices. Signed, Dulcy Fanny. Earleen Newport, DO [facility] Radiology Electronically Signed   By: Corrie Mckusick D.O.   On: 04/13/2016 15:06    Labs:  CBC:  Recent  Labs  09/29/15 1556 10/25/15 0922 04/11/16 0834 04/12/16 0217  WBC 6.1 3.8* 6.2 3.6*  HGB 14.2 14.6  8.4* 10.2*  HCT 42.2 41.9 24.8* 29.1*  PLT 101* 83* 119* 65*    COAGS:  Recent Labs  04/12/16 0959  INR 1.06    BMP:  Recent Labs  09/29/15 1556 10/25/15 0922 04/11/16 0834 04/12/16 0215  NA 142 143 141 140  K 3.8 3.9 3.7 3.6  CL  --   --   --  112*  CO2 23 21* 23 23  GLUCOSE 88 94 91 100*  BUN 13.0 13.4 33.1* 17  CALCIUM 9.1 9.2 8.3* 7.6*  CREATININE 1.0 1.0 0.9 0.82  GFRNONAA  --   --   --  >60  GFRAA  --   --   --  >60    LIVER FUNCTION TESTS:  Recent Labs  09/29/15 1556 10/25/15 0922 04/11/16 0834  BILITOT 0.85 0.87 0.78  AST 21 20 20   ALT 22 22 18   ALKPHOS 72 67 40  PROT 6.8 6.7 5.7*  ALBUMIN 4.0 3.9 3.3*    Assessment and Plan:  Bleeding gastric varices Post BRTO in IR with Dr Earleen Newport 04/13/16 Doing well CT abd with Cx ordered for today Will hear from OP IR clinic for 2-4 week follow up with Dr Earleen Newport GI team will follow---repeat Endo 3 mo  Electronically Signed: Nandan Willems A 04/14/2016, 10:24 AM   I spent a total of 15 Minutes at the the patient's bedside AND on the patient's hospital floor or unit, greater than 50% of which was counseling/coordinating care for BRTO

## 2016-04-14 NOTE — Progress Notes (Signed)
Nicole [name] 9:40 AM  Subjective: Patient doing well status post her interventional radiology procedure without signs of obvious complication or bleeding Objective: Vital signs stable afebrile no acute distress patient in good spirits abdomen is soft nontender no new labs  Assessment: gastric varices status post BRTO Plan: please let us know if we could help any further with this hospital stay and we are happy to see back when necessary  [facility] E  Pager [phone] After 5PM or if no answer call [phone]

## 2016-04-15 ENCOUNTER — Other Ambulatory Visit: Payer: Self-pay | Admitting: Oncology

## 2016-04-15 ENCOUNTER — Other Ambulatory Visit (HOSPITAL_COMMUNITY): Payer: Self-pay | Admitting: General Surgery

## 2016-04-15 ENCOUNTER — Other Ambulatory Visit: Payer: Self-pay | Admitting: *Deleted

## 2016-04-15 ENCOUNTER — Encounter (HOSPITAL_COMMUNITY): Payer: Self-pay | Admitting: Gastroenterology

## 2016-04-15 ENCOUNTER — Telehealth: Payer: Self-pay | Admitting: Oncology

## 2016-04-15 ENCOUNTER — Other Ambulatory Visit: Payer: Self-pay | Admitting: Interventional Radiology

## 2016-04-15 DIAGNOSIS — I864 Gastric varices: Secondary | ICD-10-CM

## 2016-04-15 DIAGNOSIS — C50411 Malignant neoplasm of upper-outer quadrant of right female breast: Secondary | ICD-10-CM

## 2016-04-15 DIAGNOSIS — C50911 Malignant neoplasm of unspecified site of right female breast: Secondary | ICD-10-CM

## 2016-04-15 DIAGNOSIS — K922 Gastrointestinal hemorrhage, unspecified: Secondary | ICD-10-CM

## 2016-04-15 DIAGNOSIS — C7800 Secondary malignant neoplasm of unspecified lung: Secondary | ICD-10-CM

## 2016-04-15 LAB — TYPE AND SCREEN
ABO/RH(D): O POS
Antibody Screen: NEGATIVE
UNIT DIVISION: 0
Unit division: 0
Unit division: 0
Unit division: 0

## 2016-04-15 LAB — CBC
HEMATOCRIT: 30.5 % — AB (ref 36.0–46.0)
HEMOGLOBIN: 10.1 g/dL — AB (ref 12.0–15.0)
MCH: 29.8 pg (ref 26.0–34.0)
MCHC: 33.1 g/dL (ref 30.0–36.0)
MCV: 90 fL (ref 78.0–100.0)
Platelets: 63 10*3/uL — ABNORMAL LOW (ref 150–400)
RBC: 3.39 MIL/uL — AB (ref 3.87–5.11)
RDW: 15.9 % — ABNORMAL HIGH (ref 11.5–15.5)
WBC: 5.4 10*3/uL (ref 4.0–10.5)

## 2016-04-15 NOTE — Assessment & Plan Note (Signed)
Patient continues to take Femara oral therapy as directed.  Patient is scheduled to return for labs on 05/02/2016.  She is scheduled for a follow-up visit at the [facility] on 05/09/2016.

## 2016-04-15 NOTE — Progress Notes (Signed)
Discharge instructions given and all questions answered. All prescriptions sent home with patient. Pt escorted to private vehicle via wheelchair.

## 2016-04-15 NOTE — Care Management Note (Signed)
Case Management Note  Patient Details  Name: HOLLEIGH COLESON MRN: [id number] Date of Birth: 12-13-1956  Subjective/Objective:                 Spoke to patent in the room. Very nice patient who states that she is "ready to go home." She will make follow up appointment with Dr Jordan Hawks Ranking (her husband's PCP) after DC. Her two sisters at the bedside assured she would. She denies any CM needs.    Action/Plan:  DC to home today, self care.  Expected Discharge Date:   (unknown)               Expected Discharge Plan:  Home/Self Care  In-House Referral:     Discharge planning Services  CM Consult  Post Acute Care Choice:    Choice offered to:     DME Arranged:    DME Agency:     HH Arranged:    [place] Agency:     Status of Service:  Completed, signed off  Medicare Important Message Given:    Date Medicare IM Given:    Medicare IM give by:    Date Additional Medicare IM Given:    Additional Medicare Important Message give by:     If discussed at [place] of Stay Meetings, dates discussed:    Additional Comments:  Carles Collet, RN 04/15/2016, 11:35 AM

## 2016-04-15 NOTE — Discharge Instructions (Addendum)

## 2016-04-15 NOTE — Assessment & Plan Note (Signed)
Patient presented to the [facility] today with complaint of fatigue and nausea.  Patient states she vomited once this past week after eating [place] food.  Patient denies any chest pain, chest pressure, shortness breath, or pain with inspiration.  She reports that her stools have been much darker and almost black at times.  Patient denies any recent fevers or chills.  Exam today reveals lungs clear bilaterally; and abdomen soft and nontender.  Bowel sounds are positive in all 4 quads.  There was no flank pain noted on exam.  Blood counts obtained today reveal a WBC of 6.2, ANC 3.9, hemoglobin has decreased from 14.6, down to 8.4, and platelet count is 119.  Hemoccult stool was positive.  Patient has seen Dr. Oletta Lamas gastroenterologist in the past for colonoscopy.  Was able to contact Dr.Magod gastroenterologist (partner to Dr. Oletta Lamas); who advised that patient be transported to the [facility] endoscopy suite immediately for further evaluation.  Patient will be transported to the endoscopy suite per orders.  Dr. Jana Hakim has called and spoken to a hospitalist for planned admission.  Following the endoscopy.

## 2016-04-15 NOTE — Telephone Encounter (Signed)
Central radiology will contact pt to sch imaging order placed by GM 4/17 pof

## 2016-04-15 NOTE — Progress Notes (Signed)
SYMPTOM MANAGEMENT CLINIC    Chief Complaint: GI bleed  HPI:  Nicole [name] 60 y.o. female diagnosed with breast cancer.  Currently undergoing Femara oral therapy only.   Patient presented to the [facility] today with complaint of fatigue and nausea.  Patient states she vomited once this past week after eating [place] food.  Patient denies any chest pain, chest pressure, shortness breath, or pain with inspiration.  She reports that her stools have been much darker and almost black at times.  Patient denies any recent fevers or chills.  Exam today reveals lungs clear bilaterally; and abdomen soft and nontender.  Bowel sounds are positive in all 4 quads.  There was no flank pain noted on exam.  Blood counts obtained today reveal a WBC of 6.2, ANC 3.9, hemoglobin has decreased from 14.6, down to 8.4, and platelet count is 119.  Hemoccult stool was positive.  Patient has seen Dr. Oletta Lamas gastroenterologist in the past for colonoscopy.  Was able to contact Dr.Magod gastroenterologist (partner to Dr. Oletta Lamas); who advised that patient be transported to the [facility] endoscopy suite immediately for further evaluation.  Patient will be transported to the endoscopy suite per orders.  Dr. Jana Hakim has called and spoken to a hospitalist for planned admission.  Following the endoscopy.   No history exists.    Review of Systems  Constitutional: Positive for malaise/fatigue. Negative for fever and chills.  Gastrointestinal: Positive for nausea, vomiting and melena.  Neurological: Negative for weakness.  All other systems reviewed and are negative.   Past Medical History  Diagnosis Date  . IBD (inflammatory bowel disease)     resolved  . Heart murmur     mild, no cardiologist  . Blood transfusion 2012  . Radiation 07/21/12-09/07/12    5940 cGy  . Breast CA ([place])     breast ca dx 06/2010, stage 4, right     Past Surgical History  Procedure Laterality Date  . Portacath placement   2011    left side  . Dilation and curettage of uterus  2004?  [name] Mastectomy  06/02/12    right breast  . Port-a-cath removal    . Esophagogastroduodenoscopy (egd) with propofol N/A 04/11/2016    Procedure: ESOPHAGOGASTRODUODENOSCOPY (EGD) WITH PROPOFOL;  Surgeon: Clarene Essex, MD;  Location: [facility] ENDOSCOPY;  Service: Endoscopy;  Laterality: N/A;    has DVT (deep venous thrombosis) ([place]); Lymphedema of arm; Breast cancer of upper-outer quadrant of right female breast ([place]); Breast cancer metastasized to lung [facility]); GI bleed; Acute upper GI bleed; Esophageal varices ([place]); and Acute blood loss anemia on her problem list.    has No Known Allergies.    Medication List       This list is accurate as of: 04/11/16 11:34 AM.  Always use your most recent med list.               alendronate 70 MG tablet  Commonly known as:  FOSAMAX  TAKE 1 TABLET BY MOUTH ONCE A WEEK     CALTRATE 600 PO  Take by mouth daily.     letrozole 2.5 MG tablet  Commonly known as:  FEMARA  Take 1 tablet (2.5 mg total) by mouth daily.     venlafaxine XR 75 MG 24 hr capsule  Commonly known as:  EFFEXOR-XR  Take 1 capsule (75 mg total) by mouth daily.     VITAMIN D PO  Take 4,000 Units by mouth daily.  PHYSICAL EXAMINATION  Oncology Vitals 04/15/2016 04/14/2016  Height - -  Weight - -  Weight (lbs) - -  BMI (kg/m2) - -  Temp 98.7 99.7  Pulse 89 86  Resp 16 18  SpO2 94 97  BSA (m2) - -   BP Readings from Last 2 Encounters:  04/15/16 112/63  04/11/16 114/97    Physical Exam  Constitutional: She is oriented to person, place, and time and well-developed, well-nourished, and in no distress.  HENT:  Head: Normocephalic and atraumatic.  Mouth/Throat: Oropharynx is clear and moist.  Eyes: Conjunctivae and EOM are normal. Pupils are equal, round, and reactive to light. Right eye exhibits no discharge. Left eye exhibits no discharge. No scleral icterus.  Neck: Normal range of motion. Neck supple. No  JVD present. No tracheal deviation present. No thyromegaly present.  Cardiovascular: Normal rate, regular rhythm, normal heart sounds and intact distal pulses.   Pulmonary/Chest: Effort normal and breath sounds normal. No respiratory distress. She has no wheezes. She has no rales. She exhibits no tenderness.  Abdominal: Soft. Bowel sounds are normal. She exhibits no distension and no mass. There is no tenderness. There is no rebound and no guarding.  Genitourinary: Guaiac positive stool.  Musculoskeletal: Normal range of motion. She exhibits no edema or tenderness.  Lymphadenopathy:    She has no cervical adenopathy.  Neurological: She is alert and oriented to person, place, and time. Gait normal.  Skin: Skin is warm and dry. No rash noted. No erythema. No pallor.  Psychiatric: Affect normal.    LABORATORY DATA:. Admission on 04/11/2016  Component Date Value Ref Range Status  . Order Confirmation 04/11/2016 ORDER PROCESSED BY BLOOD BANK   Final  . Vitamin B-12 04/11/2016 202  180 - 914 pg/mL Final   Comment: (NOTE) This assay is not validated for testing neonatal or myeloproliferative syndrome specimens for Vitamin B12 levels. Performed at [facility]   . Folate 04/11/2016 20.3  >5.9 ng/mL Final   Performed at [facility]  . Iron 04/11/2016 27* 28 - 170 ug/dL Final  . TIBC 04/11/2016 340  250 - 450 ug/dL Final  . Saturation Ratios 04/11/2016 8* 10.4 - 31.8 % Final  . UIBC 04/11/2016 313   Final   Performed at [facility]  . Ferritin 04/11/2016 18  11 - 307 ng/mL Final   Performed at [place] 04/11/2016 5.6* 0.4 - 3.1 % Final  . RBC. 04/11/2016 2.37* 3.87 - 5.11 MIL/uL Final  . Retic Count, Manual 04/11/2016 132.7  19.0 - 186.0 K/uL Final  . ANA Ab, IFA 04/11/2016 Negative   Final   Comment: (NOTE)                                     Negative   <1:80                                     Borderline  1:80                                      Positive   >1:80 Performed At: [facility] [place], [place] [id number] Lindon Romp MD TD:[id number]   . Sodium  04/12/2016 140  135 - 145 mmol/L Final  . Potassium 04/12/2016 3.6  3.5 - 5.1 mmol/L Final  . Chloride 04/12/2016 112* 101 - 111 mmol/L Final  . CO2 04/12/2016 23  22 - 32 mmol/L Final  . Glucose, Bld 04/12/2016 100* 65 - 99 mg/dL Final  . BUN 04/12/2016 17  6 - 20 mg/dL Final  . Creatinine, Ser 04/12/2016 0.82  0.44 - 1.00 mg/dL Final  . Calcium 04/12/2016 7.6* 8.9 - 10.3 mg/dL Final  . GFR calc non Af Amer 04/12/2016 >60  >60 mL/min Final  . GFR calc Af Amer 04/12/2016 >60  >60 mL/min Final   Comment: (NOTE) The eGFR has been calculated using the CKD EPI equation. This calculation has not been validated in all clinical situations. eGFR's persistently <60 mL/min signify possible Chronic Kidney Disease.   . Anion gap 04/12/2016 5  5 - 15 Final  . MRSA by PCR 04/11/2016 POSITIVE* NEGATIVE Final   Comment:        The GeneXpert MRSA Assay (FDA approved for NASAL specimens only), is one component of a comprehensive MRSA colonization surveillance program. It is not intended to diagnose MRSA infection nor to guide or monitor treatment for MRSA infections. RESULT CALLED TO, READ BACK BY AND VERIFIED WITH: Michelene Gardener [id number] @ [facility]   . WBC 04/12/2016 3.6* 4.0 - 10.5 K/uL Final  . RBC 04/12/2016 3.30* 3.87 - 5.11 MIL/uL Final  . Hemoglobin 04/12/2016 10.2* 12.0 - 15.0 g/dL Final  . HCT 04/12/2016 29.1* 36.0 - 46.0 % Final  . MCV 04/12/2016 88.2  78.0 - 100.0 fL Final  . MCH 04/12/2016 30.9  26.0 - 34.0 pg Final  . MCHC 04/12/2016 35.1  30.0 - 36.0 g/dL Final  . RDW 04/12/2016 14.4  11.5 - 15.5 % Final  . Platelets 04/12/2016 65* 150 - 400 K/uL Final   Comment: RESULT REPEATED AND VERIFIED SPECIMEN CHECKED FOR CLOTS PLATELET COUNT CONFIRMED BY SMEAR   . Prothrombin Time 04/12/2016 14.0  11.6 - 15.2 seconds Final    . INR 04/12/2016 1.06  0.00 - 1.49 Final  . ABO/RH(D) 04/13/2016 O POS   Final  . Antibody Screen 04/13/2016 NEG   Final  . Sample Expiration 04/13/2016 04/16/2016   Final  . Unit Number 04/13/2016 [id number]   Final  . Blood Component Type 04/13/2016 PLTPHER LR2   Final  . Unit division 04/13/2016 00   Final  . Status of Unit 04/13/2016 ISSUED,FINAL   Final  . Transfusion Status 04/13/2016 OK TO TRANSFUSE   Final  . ABO/RH(D) 04/13/2016 O POS   Final  . WBC 04/14/2016 5.1  4.0 - 10.5 K/uL Final  . RBC 04/14/2016 3.26* 3.87 - 5.11 MIL/uL Final  . Hemoglobin 04/14/2016 9.8* 12.0 - 15.0 g/dL Final  . HCT 04/14/2016 29.4* 36.0 - 46.0 % Final  . MCV 04/14/2016 90.2  78.0 - 100.0 fL Final  . MCH 04/14/2016 30.1  26.0 - 34.0 pg Final  . MCHC 04/14/2016 33.3  30.0 - 36.0 g/dL Final  . RDW 04/14/2016 15.9* 11.5 - 15.5 % Final  . Platelets 04/14/2016 67* 150 - 400 K/uL Final   PLATELET COUNT CONFIRMED BY SMEAR  . WBC 04/15/2016 5.4  4.0 - 10.5 K/uL Final  . RBC 04/15/2016 3.39* 3.87 - 5.11 MIL/uL Final  . Hemoglobin 04/15/2016 10.1* 12.0 - 15.0 g/dL Final  . HCT 04/15/2016 30.5* 36.0 - 46.0 % Final  . MCV 04/15/2016 90.0  78.0 - 100.0  fL Final  . MCH 04/15/2016 29.8  26.0 - 34.0 pg Final  . MCHC 04/15/2016 33.1  30.0 - 36.0 g/dL Final  . RDW 04/15/2016 15.9* 11.5 - 15.5 % Final  . Platelets 04/15/2016 63* 150 - 400 K/uL Final   [facility] Outpatient Visit on 04/11/2016  Component Date Value Ref Range Status  . ABO/RH(D) 04/11/2016 O POS   Final  . Antibody Screen 04/11/2016 NEG   Final  . Sample Expiration 04/11/2016 04/14/2016   Final  . Unit Number 04/11/2016 [id number]   Final  . Blood Component Type 04/11/2016 RED CELLS,LR   Final  . Unit division 04/11/2016 00   Final  . Status of Unit 04/11/2016 ISSUED,FINAL   Final  . Transfusion Status 04/11/2016 OK TO TRANSFUSE   Final  . Crossmatch Result 04/11/2016 Compatible   Final  . Unit Number  04/11/2016 [id number]   Final  . Blood Component Type 04/11/2016 RBC LR PHER2   Final  . Unit division 04/11/2016 00   Final  . Status of Unit 04/11/2016 ISSUED,FINAL   Final  . Transfusion Status 04/11/2016 OK TO TRANSFUSE   Final  . Crossmatch Result 04/11/2016 Compatible   Final  . Unit Number 04/11/2016 [id number]   Final  . Blood Component Type 04/11/2016 RBC LR PHER1   Final  . Unit division 04/11/2016 00   Final  . Status of Unit 04/11/2016 REL FROM [facility]   Final  . Transfusion Status 04/11/2016 OK TO TRANSFUSE   Final  . Crossmatch Result 04/11/2016 Compatible   Final  . Unit Number 04/11/2016 [id number]   Final  . Blood Component Type 04/11/2016 RED CELLS,LR   Final  . Unit division 04/11/2016 00   Final  . Status of Unit 04/11/2016 REL FROM [facility]   Final  . Transfusion Status 04/11/2016 OK TO TRANSFUSE   Final  . Crossmatch Result 04/11/2016 Compatible   Final  . Order Confirmation 04/12/2016 ORDER PROCESSED BY BLOOD BANK   Final  Appointment on 04/11/2016  Component Date Value Ref Range Status  . WBC 04/11/2016 6.2  3.9 - 10.3 10e3/uL Final  . NEUT# 04/11/2016 3.9  1.5 - 6.5 10e3/uL Final  . HGB 04/11/2016 8.4* 11.6 - 15.9 g/dL Final  . HCT 04/11/2016 24.8* 34.8 - 46.6 % Final  . Platelets 04/11/2016 119* 145 - 400 10e3/uL Final  . MCV 04/11/2016 87.9  79.5 - 101.0 fL Final  . MCH 04/11/2016 29.8  25.1 - 34.0 pg Final  . MCHC 04/11/2016 33.9  31.5 - 36.0 g/dL Final  . RBC 04/11/2016 2.82* 3.70 - 5.45 10e6/uL Final  . RDW 04/11/2016 15.1* 11.2 - 14.5 % Final  . lymph# 04/11/2016 1.7  0.9 - 3.3 10e3/uL Final  . MONO# 04/11/2016 0.5  0.1 - 0.9 10e3/uL Final  . Eosinophils Absolute 04/11/2016 0.1  0.0 - 0.5 10e3/uL Final  . Basophils Absolute 04/11/2016 0.0  0.0 - 0.1 10e3/uL Final  . NEUT% 04/11/2016 63.1  38.4 - 76.8 % Final  . LYMPH% 04/11/2016 27.5  14.0 - 49.7 % Final  . MONO% 04/11/2016 8.1  0.0 - 14.0 % Final  . EOS% 04/11/2016 1.0  0.0 - 7.0 % Final  .  BASO% 04/11/2016 0.3  0.0 - 2.0 % Final  . Sodium 04/11/2016 141  136 - 145 mEq/L Final  . Potassium 04/11/2016 3.7  3.5 - 5.1 mEq/L Final  . Chloride 04/11/2016 111* 98 - 109 mEq/L Final  . CO2 04/11/2016  23  22 - 29 mEq/L Final  . Glucose 04/11/2016 91  70 - 140 mg/dl Final   Glucose reference range is for nonfasting patients. Fasting glucose reference range is 70- 100.  [name] BUN 04/11/2016 33.1* 7.0 - 26.0 mg/dL Final  . Creatinine 04/11/2016 0.9  0.6 - 1.1 mg/dL Final  . Total Bilirubin 04/11/2016 0.78  0.20 - 1.20 mg/dL Final  . Alkaline Phosphatase 04/11/2016 40  40 - 150 U/L Final  . AST 04/11/2016 20  5 - 34 U/L Final  . ALT 04/11/2016 18  0 - 55 U/L Final  . Total Protein 04/11/2016 5.7* 6.4 - 8.3 g/dL Final  . Albumin 04/11/2016 3.3* 3.5 - 5.0 g/dL Final  . Calcium 04/11/2016 8.3* 8.4 - 10.4 mg/dL Final  . Anion Gap 04/11/2016 7  3 - 11 mEq/L Final  . EGFR 04/11/2016 69* >90 ml/min/1.73 m2 Final   eGFR is calculated using the CKD-EPI Creatinine Equation (2009)    RADIOGRAPHIC STUDIES: Ct Abdomen Pelvis W Wo Contrast  04/11/2016  CLINICAL DATA:  60 year old female with anemia and gastric varices on endoscopy. Evaluate for BRTO candidacy. EXAM: CT ABDOMEN AND PELVIS WITHOUT AND WITH CONTRAST TECHNIQUE: Multidetector CT imaging of the abdomen and pelvis was performed following the standard protocol before and following the bolus administration of intravenous contrast. CONTRAST:  149m ISOVUE-300 IOPAMIDOL (ISOVUE-300) INJECTION 61% COMPARISON:  Prior CT scan of the abdomen and pelvis 12/02/2012 FINDINGS: Lower chest: No acute findings. The intracardiac blood pool is hypodense relative to the adjacent myocardium consistent with anemia. Hepatobiliary: The liver does not appear overtly cirrhotic. There is no discrete solid hepatic lesion. Stable cyst in the posterior aspect of hepatic segment 6. The portal vein is normal in size. The hepatic veins are normal in size and patent. Please see  vascular section below for further detail. Gallbladder is unremarkable. No intra or extrahepatic biliary ductal dilatation. Pancreas: No mass, inflammatory changes, or other significant abnormality. Spleen: Within normal limits in size and appearance. Adrenals/Urinary Tract: No masses identified. No evidence of hydronephrosis. Stomach/Bowel: No evidence of obstruction, inflammatory process, or abnormal fluid collections. Vascular/Lymphatic: Large gastrorenal shunt resulting in extensive gastric varices in the gastric fundus and cardia. This is almost certainly the source of the patient's bleeding. Additionally, there are small esophageal varices. The primary afferent inflow is via the posterior gastric vein. Additionally, there are contributions from small short gastric veins. No suspicious adenopathy. Reproductive: No mass or other significant abnormality. Other: None. Musculoskeletal: Chronic appearing L1 compression fracture with approximately 40% height loss anteriorly. No significant retropulsion or exaggerated kyphosis. Multilevel degenerative disc disease most noticeable at L4-L5 and L5-S1. No acute fracture or aggressive appearing lytic or blastic osseous lesion. IMPRESSION: 1. Large gastrorenal shunt resulting in extensive gastric varices. The primary afferent inflow is via the posterior gastric vein with lesser contributions from the short gastric veins. The anatomy is amenable to balloon occluded retrograde transvenous obliteration (BRTO). Please see coronal reformatted images for further anatomic detail. 2. Surprisingly, the liver does not appear overtly cirrhotic. However, the presence of a gastro renal shunt is highly suggestive of underlying cirrhosis. 3. The splenic vein remains patent. 4. Chronic appearing L1 compression fracture with approximately 40% height loss. 5. Multilevel degenerative disc disease. 6. Anemia. Signed, HCriselda Peaches MD [facility]  [facility] Center[facility] Electronically Signed   By: HJacqulynn CadetM.D.   On: 04/11/2016 17:30   Ir Angiogram Selective Each Additional Vessel  04/13/2016  CLINICAL DATA:  60 year old female with a history of bleeding gastric varices. She has no significant ascites and low risk esophageal varices. Balloon occluded retrograde trans venous obliteration is indicated as the solution for bleeding gastric varices. EXAM: IR EMBO ART VEN HEMORR LYMPH EXTRAV INC GUIDE ROADMAPPING; ADDITIONAL ARTERIOGRAPHY; IR ULTRASOUND GUIDANCE VASC ACCESS RIGHT; LEFT RENAL VENOGRAPHY; ARTERIOGRAPHY MEDICATIONS: No additional ANESTHESIA/SEDATION: General - as administered by the Anesthesia department CONTRAST:  1 ISOVUE-300 IOPAMIDOL (ISOVUE-300) INJECTION 61% FLUOROSCOPY TIME:  Fluoroscopy Time: 49 minutes 0 seconds COMPLICATIONS: None PROCEDURE: Informed written consent was obtained from the patient and the patient's family after a thorough discussion of the procedural risks, benefits and alternatives. Specific risks that were addressed regarding BRTO include, bleeding, infection, contrast reaction, kidney injury, need for further procedure or surgery including TIPS and/ or endoscopy, pulmonary embolism, stroke, worsening of ascites, worsening of esophageal varices, paradoxical liver failure, varix rupture, cardiopulmonary collapse, death. All questions were addressed. Maximal Sterile Barrier Technique was utilized including caps, mask, sterile gowns, sterile gloves, sterile drape, hand hygiene and skin antiseptic. A timeout was performed prior to the initiation of the procedure. Ultrasound survey of the right inguinal region was performed with images stored and sent to PACs. A single wall puncture needle was used access the right common femoral artery under ultrasound guidance with saline syringe. With venous blood flow returned, a Bentson wire was observed to enter the common femoral vein and the IVC under fluoroscopy. The needle was  removed, and a small incision was made, and 9 [place] dilator was passed over the Bentson wire. A 10 French TIPS sheath was then passed over the Bentson wire after manually creating a significant leftward current on the catheter. The inner dilator and wire were removed, and a 5 [place] cobra catheter was passed over the Bentson wire into the IVC. Cobra catheter was used to select the left renal vein orifice. Combination of the Bentson wire, Glidewire, Rosen wire were used to gain purchased into the renal vein, with renal vein limited venogram performed to identify the in flowing gastro renal shunt. With a safety curve on a stiff Amplatz wire position into superior pole renal vein, the TIPS sheath was advanced into the left renal vein over the Cobra catheter. Pullback reinforced straight sheath selection technique (PRESSS) was performed, and as the tip sheath was withdrawn, contrast was used to identified the inflow of the gastro renal shunt. With the coaxial Amplatz wire in position, a combination of a standard Kumpe the catheter and a 4 French angled glide catheter were used to cannulate the orifice of the gastro renal shunt, and 11 o'clock position from the in flowing shunt opposite the adrenal vein inflow. Once the Glidewire was advanced into the shunt, the glide catheter was advanced, and the Glidewire was exchanged for a stiff Amplatz wire with a safety curve on the tip. The renal vein buddy wire was withdrawn, and the TIPS sheath was advanced into the gastro renal shunt. The glide catheter was withdrawn and then a balloon occlusion catheter was advanced over the Amplatz wire into the gastro renal shunt. Amplatz wire was then withdrawn. Angiogram was then performed to confirmed location within the gastro renal shunt outflow. Balloon was inflated on the balloon occlusion catheter and was withdrawn to the first encountered web to achieve hemostasis. Standard angiogram was performed.  CO2 angiogram was also  performed. A penumbra Lantern microcatheter and a fathom wire were navigated into the shunt. Once we confirmed location within the outflow with standard angiogram, treatment was  initiated. A foamed solution of detergent/sclerosant was mixed in a standard Tessari method, with 1:2:3 ratio of ethiodized oil: sodium tetradecyl sulfate: Room air. The sclerosed sent was infused under fluoroscopic guidance, with observation of reflux into the afferent veins. Once reflux was identified into the posterior gastric vein, sclerosing was completed. The micro catheter was withdrawn to the tip of the balloon occlusion catheter, and coil mass was deposited with deployment of multiple Ruby coils. 4 standard Ruby coils and 1 soft ruby coil deployed. After 20 minutes, the balloon was deflated under fluoroscopy to observe the foam mass. Once we were confident there was no migration, balloon was withdrawn and the sheath was withdrawn. Hemostasis was achieved with manual compression. Patient remained hemodynamically stable throughout. No complications were encountered and no significant blood loss was encountered. IMPRESSION: Status post coil assisted balloon occluded retrograde trans venous obliteration (BRTO) for treatment of bleeding gastric varices. Signed, Dulcy Fanny. Earleen Newport, DO [facility]iology Electronically Signed   By: Corrie Mckusick D.O.   On: 04/13/2016 15:06   Ir Venogram Renal Uni Left  04/13/2016  CLINICAL DATA:  60 year old female with a history of bleeding gastric varices. She has no significant ascites and low risk esophageal varices. Balloon occluded retrograde trans venous obliteration is indicated as the solution for bleeding gastric varices. EXAM: IR EMBO ART VEN HEMORR LYMPH EXTRAV INC GUIDE ROADMAPPING; ADDITIONAL ARTERIOGRAPHY; IR ULTRASOUND GUIDANCE VASC ACCESS RIGHT; LEFT RENAL VENOGRAPHY; ARTERIOGRAPHY MEDICATIONS: No additional ANESTHESIA/SEDATION: General -  as administered by the Anesthesia department CONTRAST:  1 ISOVUE-300 IOPAMIDOL (ISOVUE-300) INJECTION 61% FLUOROSCOPY TIME:  Fluoroscopy Time: 49 minutes 0 seconds COMPLICATIONS: None PROCEDURE: Informed written consent was obtained from the patient and the patient's family after a thorough discussion of the procedural risks, benefits and alternatives. Specific risks that were addressed regarding BRTO include, bleeding, infection, contrast reaction, kidney injury, need for further procedure or surgery including TIPS and/ or endoscopy, pulmonary embolism, stroke, worsening of ascites, worsening of esophageal varices, paradoxical liver failure, varix rupture, cardiopulmonary collapse, death. All questions were addressed. Maximal Sterile Barrier Technique was utilized including caps, mask, sterile gowns, sterile gloves, sterile drape, hand hygiene and skin antiseptic. A timeout was performed prior to the initiation of the procedure. Ultrasound survey of the right inguinal region was performed with images stored and sent to PACs. A single wall puncture needle was used access the right common femoral artery under ultrasound guidance with saline syringe. With venous blood flow returned, a Bentson wire was observed to enter the common femoral vein and the IVC under fluoroscopy. The needle was removed, and a small incision was made, and 9 [place] dilator was passed over the Bentson wire. A 10 French TIPS sheath was then passed over the Bentson wire after manually creating a significant leftward current on the catheter. The inner dilator and wire were removed, and a 5 [place] cobra catheter was passed over the Bentson wire into the IVC. Cobra catheter was used to select the left renal vein orifice. Combination of the Bentson wire, Glidewire, Rosen wire were used to gain purchased into the renal vein, with renal vein limited venogram performed to identify the in flowing gastro renal shunt. With a safety curve on a stiff Amplatz  wire position into superior pole renal vein, the TIPS sheath was advanced into the left renal vein over the Cobra catheter. Pullback reinforced straight sheath selection technique (PRESSS) was performed, and as the tip sheath was withdrawn, contrast was used to identified the  inflow of the gastro renal shunt. With the coaxial Amplatz wire in position, a combination of a standard Kumpe the catheter and a 4 French angled glide catheter were used to cannulate the orifice of the gastro renal shunt, and 11 o'clock position from the in flowing shunt opposite the adrenal vein inflow. Once the Glidewire was advanced into the shunt, the glide catheter was advanced, and the Glidewire was exchanged for a stiff Amplatz wire with a safety curve on the tip. The renal vein buddy wire was withdrawn, and the TIPS sheath was advanced into the gastro renal shunt. The glide catheter was withdrawn and then a balloon occlusion catheter was advanced over the Amplatz wire into the gastro renal shunt. Amplatz wire was then withdrawn. Angiogram was then performed to confirmed location within the gastro renal shunt outflow. Balloon was inflated on the balloon occlusion catheter and was withdrawn to the first encountered web to achieve hemostasis. Standard angiogram was performed.  CO2 angiogram was also performed. A penumbra Lantern microcatheter and a fathom wire were navigated into the shunt. Once we confirmed location within the outflow with standard angiogram, treatment was initiated. A foamed solution of detergent/sclerosant was mixed in a standard Tessari method, with 1:2:3 ratio of ethiodized oil: sodium tetradecyl sulfate: Room air. The sclerosed sent was infused under fluoroscopic guidance, with observation of reflux into the afferent veins. Once reflux was identified into the posterior gastric vein, sclerosing was completed. The micro catheter was withdrawn to the tip of the balloon occlusion catheter, and coil mass was deposited  with deployment of multiple Ruby coils. 4 standard Ruby coils and 1 soft ruby coil deployed. After 20 minutes, the balloon was deflated under fluoroscopy to observe the foam mass. Once we were confident there was no migration, balloon was withdrawn and the sheath was withdrawn. Hemostasis was achieved with manual compression. Patient remained hemodynamically stable throughout. No complications were encountered and no significant blood loss was encountered. IMPRESSION: Status post coil assisted balloon occluded retrograde trans venous obliteration (BRTO) for treatment of bleeding gastric varices. Signed, Dulcy Fanny. Earleen Newport, DO [facility] Radiology Electronically Signed   By: Corrie Mckusick D.O.   On: 04/13/2016 15:06   Ir Angiogram Follow Up Study  04/13/2016  CLINICAL DATA:  60 year old female with a history of bleeding gastric varices. She has no significant ascites and low risk esophageal varices. Balloon occluded retrograde trans venous obliteration is indicated as the solution for bleeding gastric varices. EXAM: IR EMBO ART VEN HEMORR LYMPH EXTRAV INC GUIDE ROADMAPPING; ADDITIONAL ARTERIOGRAPHY; IR ULTRASOUND GUIDANCE VASC ACCESS RIGHT; LEFT RENAL VENOGRAPHY; ARTERIOGRAPHY MEDICATIONS: No additional ANESTHESIA/SEDATION: General - as administered by the Anesthesia department CONTRAST:  1 ISOVUE-300 IOPAMIDOL (ISOVUE-300) INJECTION 61% FLUOROSCOPY TIME:  Fluoroscopy Time: 49 minutes 0 seconds COMPLICATIONS: None PROCEDURE: Informed written consent was obtained from the patient and the patient's family after a thorough discussion of the procedural risks, benefits and alternatives. Specific risks that were addressed regarding BRTO include, bleeding, infection, contrast reaction, kidney injury, need for further procedure or surgery including TIPS and/ or endoscopy, pulmonary embolism, stroke, worsening of ascites, worsening of esophageal varices, paradoxical liver  failure, varix rupture, cardiopulmonary collapse, death. All questions were addressed. Maximal Sterile Barrier Technique was utilized including caps, mask, sterile gowns, sterile gloves, sterile drape, hand hygiene and skin antiseptic. A timeout was performed prior to the initiation of the procedure. Ultrasound survey of the right inguinal region was performed with images stored and sent to PACs. A single  wall puncture needle was used access the right common femoral artery under ultrasound guidance with saline syringe. With venous blood flow returned, a Bentson wire was observed to enter the common femoral vein and the IVC under fluoroscopy. The needle was removed, and a small incision was made, and 9 [place] dilator was passed over the Bentson wire. A 10 French TIPS sheath was then passed over the Bentson wire after manually creating a significant leftward current on the catheter. The inner dilator and wire were removed, and a 5 [place] cobra catheter was passed over the Bentson wire into the IVC. Cobra catheter was used to select the left renal vein orifice. Combination of the Bentson wire, Glidewire, Rosen wire were used to gain purchased into the renal vein, with renal vein limited venogram performed to identify the in flowing gastro renal shunt. With a safety curve on a stiff Amplatz wire position into superior pole renal vein, the TIPS sheath was advanced into the left renal vein over the Cobra catheter. Pullback reinforced straight sheath selection technique (PRESSS) was performed, and as the tip sheath was withdrawn, contrast was used to identified the inflow of the gastro renal shunt. With the coaxial Amplatz wire in position, a combination of a standard Kumpe the catheter and a 4 French angled glide catheter were used to cannulate the orifice of the gastro renal shunt, and 11 o'clock position from the in flowing shunt opposite the adrenal vein inflow. Once the Glidewire was advanced into the shunt, the glide  catheter was advanced, and the Glidewire was exchanged for a stiff Amplatz wire with a safety curve on the tip. The renal vein buddy wire was withdrawn, and the TIPS sheath was advanced into the gastro renal shunt. The glide catheter was withdrawn and then a balloon occlusion catheter was advanced over the Amplatz wire into the gastro renal shunt. Amplatz wire was then withdrawn. Angiogram was then performed to confirmed location within the gastro renal shunt outflow. Balloon was inflated on the balloon occlusion catheter and was withdrawn to the first encountered web to achieve hemostasis. Standard angiogram was performed.  CO2 angiogram was also performed. A penumbra Lantern microcatheter and a fathom wire were navigated into the shunt. Once we confirmed location within the outflow with standard angiogram, treatment was initiated. A foamed solution of detergent/sclerosant was mixed in a standard Tessari method, with 1:2:3 ratio of ethiodized oil: sodium tetradecyl sulfate: Room air. The sclerosed sent was infused under fluoroscopic guidance, with observation of reflux into the afferent veins. Once reflux was identified into the posterior gastric vein, sclerosing was completed. The micro catheter was withdrawn to the tip of the balloon occlusion catheter, and coil mass was deposited with deployment of multiple Ruby coils. 4 standard Ruby coils and 1 soft ruby coil deployed. After 20 minutes, the balloon was deflated under fluoroscopy to observe the foam mass. Once we were confident there was no migration, balloon was withdrawn and the sheath was withdrawn. Hemostasis was achieved with manual compression. Patient remained hemodynamically stable throughout. No complications were encountered and no significant blood loss was encountered. IMPRESSION: Status post coil assisted balloon occluded retrograde trans venous obliteration (BRTO) for treatment of bleeding gastric varices. Signed, Dulcy Fanny. Earleen Newport, DO [facility]  [facility] Rad[facility] Radiology Electronically Signed   By: Corrie Mckusick D.O.   On: 04/13/2016 15:06   Ir US Guide Vasc Access Right  04/13/2016  CLINICAL DATA:  60 year old female with a history of bleeding gastric varices. She has no  significant ascites and low risk esophageal varices. Balloon occluded retrograde trans venous obliteration is indicated as the solution for bleeding gastric varices. EXAM: IR EMBO ART VEN HEMORR LYMPH EXTRAV INC GUIDE ROADMAPPING; ADDITIONAL ARTERIOGRAPHY; IR ULTRASOUND GUIDANCE VASC ACCESS RIGHT; LEFT RENAL VENOGRAPHY; ARTERIOGRAPHY MEDICATIONS: No additional ANESTHESIA/SEDATION: General - as administered by the Anesthesia department CONTRAST:  1 ISOVUE-300 IOPAMIDOL (ISOVUE-300) INJECTION 61% FLUOROSCOPY TIME:  Fluoroscopy Time: 49 minutes 0 seconds COMPLICATIONS: None PROCEDURE: Informed written consent was obtained from the patient and the patient's family after a thorough discussion of the procedural risks, benefits and alternatives. Specific risks that were addressed regarding BRTO include, bleeding, infection, contrast reaction, kidney injury, need for further procedure or surgery including TIPS and/ or endoscopy, pulmonary embolism, stroke, worsening of ascites, worsening of esophageal varices, paradoxical liver failure, varix rupture, cardiopulmonary collapse, death. All questions were addressed. Maximal Sterile Barrier Technique was utilized including caps, mask, sterile gowns, sterile gloves, sterile drape, hand hygiene and skin antiseptic. A timeout was performed prior to the initiation of the procedure. Ultrasound survey of the right inguinal region was performed with images stored and sent to PACs. A single wall puncture needle was used access the right common femoral artery under ultrasound guidance with saline syringe. With venous blood flow returned, a Bentson wire was observed to enter the common femoral vein and the IVC under  fluoroscopy. The needle was removed, and a small incision was made, and 9 [place] dilator was passed over the Bentson wire. A 10 French TIPS sheath was then passed over the Bentson wire after manually creating a significant leftward current on the catheter. The inner dilator and wire were removed, and a 5 [place] cobra catheter was passed over the Bentson wire into the IVC. Cobra catheter was used to select the left renal vein orifice. Combination of the Bentson wire, Glidewire, Rosen wire were used to gain purchased into the renal vein, with renal vein limited venogram performed to identify the in flowing gastro renal shunt. With a safety curve on a stiff Amplatz wire position into superior pole renal vein, the TIPS sheath was advanced into the left renal vein over the Cobra catheter. Pullback reinforced straight sheath selection technique (PRESSS) was performed, and as the tip sheath was withdrawn, contrast was used to identified the inflow of the gastro renal shunt. With the coaxial Amplatz wire in position, a combination of a standard Kumpe the catheter and a 4 French angled glide catheter were used to cannulate the orifice of the gastro renal shunt, and 11 o'clock position from the in flowing shunt opposite the adrenal vein inflow. Once the Glidewire was advanced into the shunt, the glide catheter was advanced, and the Glidewire was exchanged for a stiff Amplatz wire with a safety curve on the tip. The renal vein buddy wire was withdrawn, and the TIPS sheath was advanced into the gastro renal shunt. The glide catheter was withdrawn and then a balloon occlusion catheter was advanced over the Amplatz wire into the gastro renal shunt. Amplatz wire was then withdrawn. Angiogram was then performed to confirmed location within the gastro renal shunt outflow. Balloon was inflated on the balloon occlusion catheter and was withdrawn to the first encountered web to achieve hemostasis. Standard angiogram was performed.  CO2  angiogram was also performed. A penumbra Lantern microcatheter and a fathom wire were navigated into the shunt. Once we confirmed location within the outflow with standard angiogram, treatment was initiated. A foamed solution of detergent/sclerosant was mixed in a standard Arlina Robes  method, with 1:2:3 ratio of ethiodized oil: sodium tetradecyl sulfate: Room air. The sclerosed sent was infused under fluoroscopic guidance, with observation of reflux into the afferent veins. Once reflux was identified into the posterior gastric vein, sclerosing was completed. The micro catheter was withdrawn to the tip of the balloon occlusion catheter, and coil mass was deposited with deployment of multiple Ruby coils. 4 standard Ruby coils and 1 soft ruby coil deployed. After 20 minutes, the balloon was deflated under fluoroscopy to observe the foam mass. Once we were confident there was no migration, balloon was withdrawn and the sheath was withdrawn. Hemostasis was achieved with manual compression. Patient remained hemodynamically stable throughout. No complications were encountered and no significant blood loss was encountered. IMPRESSION: Status post coil assisted balloon occluded retrograde trans venous obliteration (BRTO) for treatment of bleeding gastric varices. Signed, Dulcy Fanny. Earleen Newport, DO [facility] Radiology Electronically Signed   By: Corrie Mckusick D.O.   On: 04/13/2016 15:06   Ct Abd Wo & W Cm  04/14/2016  CLINICAL DATA:  60 year old female with a history of hemorrhaging gastric varices an low risk esophageal varices. 24 hours status post coil assisted balloon retrograde trans venous obliteration. EXAM: CT ABDOMEN WITH CONTRAST TECHNIQUE: Multidetector CT imaging of the abdomen was performed using the standard protocol following bolus administration of intravenous contrast. CONTRAST:  1 ISOVUE-300 IOPAMIDOL (ISOVUE-300) INJECTION 61% COMPARISON:  CT 04/11/2016 FINDINGS:  Lower chest: Surgical changes of right mastectomy. Heart size within normal limits.  No pericardial fluid/thickening. No lower mediastinal adenopathy. Unremarkable appearance of the distal esophagus. No hiatal hernia. Trace bilateral pleural effusions with associated minimal atelectasis. No confluent airspace disease. Abdomen: Similar appearance of the liver 2 prior, with benign cystic lesion in segment 6. As was noted on comparison CT, there is no significant nodularity or enlargement of segment 1, as usually seen with imaging diagnosis of cirrhosis. Cranial caudal span of the spleen measures approximately 14 cm. Unremarkable appearance of the adrenal glands. Contracted gallbladder. Unremarkable appearance of the pancreas. Unremarkable bilateral kidneys. Unremarkable visualized course of the bilateral ureters. Small volume of ascitic fluid. Unremarkable appearance of stomach, and visualized small bowel. Diverticular disease of the colon again visualized. No abnormally distended small bowel or colon. Small bowel, stomach, and colonic wall unremarkable. Post interventional changes of gastric variceal treatment with radio-opaque sclerosant filling the previous GV network. No residual gastric varices visualized on the study. Similar appearance of esophageal varices in the lower esophagus. The most proximal aspect of the gastro renal shunt has thrombosed, with trace thrombus extending along the roof of the left renal vein to the ostium with the IVC. Left renal vein remains widely patent. There is trace sclerosing agent within distal hepatic veins of the left and right liver. Widely patent portal system, including portal vein, splenic vein, superior mesenteric vein, inferior mesenteric vein, and contributing inflow. Posterior gastric vein and short gastric veins have been treated, with no further contribution to the varices network. Unremarkable appearance of the abdominal aorta with minimal atherosclerotic changes.  Patency of celiac artery, superior mesenteric artery, and inferior mesenteric artery maintained. Bilateral renal arteries maintained. Similar configuration of compression fracture of L1. Degenerative changes of the visualized spine, unchanged from prior. IMPRESSION: Status post BRTO, with complete obliteration of Type B gastric variceal network and no further contributing inflow. Trace ascites, potentially reactive status post treatment. Thrombosis of the spleno-gastro-renal shunt, with tiny amount of thrombus extending from the shunt inflow along roof of the left  renal vein, with wide patency of left renal vein outflow maintained. IVC widely patent. Trace radiopaque sclerosant within distal portal vein branches of the bilateral liver lobes. Portal system patency is maintained. Splenomegaly, compatible with portal venous hypertension. Signed, Dulcy Fanny. Earleen Newport, DO [facility] Inter[facility] Radiology Specialists [facility] Radiology Electronically Signed   By: Corrie Mckusick D.O.   On: 04/14/2016 14:40   [place] Guide Roadmapping  04/13/2016  CLINICAL DATA:  60 year old female with a history of bleeding gastric varices. She has no significant ascites and low risk esophageal varices. Balloon occluded retrograde trans venous obliteration is indicated as the solution for bleeding gastric varices. EXAM: IR EMBO ART VEN HEMORR LYMPH EXTRAV INC GUIDE ROADMAPPING; ADDITIONAL ARTERIOGRAPHY; IR ULTRASOUND GUIDANCE VASC ACCESS RIGHT; LEFT RENAL VENOGRAPHY; ARTERIOGRAPHY MEDICATIONS: No additional ANESTHESIA/SEDATION: General - as administered by the Anesthesia department CONTRAST:  1 ISOVUE-300 IOPAMIDOL (ISOVUE-300) INJECTION 61% FLUOROSCOPY TIME:  Fluoroscopy Time: 49 minutes 0 seconds COMPLICATIONS: None PROCEDURE: Informed written consent was obtained from the patient and the patient's family after a thorough discussion of the procedural risks, benefits and alternatives. Specific risks  that were addressed regarding BRTO include, bleeding, infection, contrast reaction, kidney injury, need for further procedure or surgery including TIPS and/ or endoscopy, pulmonary embolism, stroke, worsening of ascites, worsening of esophageal varices, paradoxical liver failure, varix rupture, cardiopulmonary collapse, death. All questions were addressed. Maximal Sterile Barrier Technique was utilized including caps, mask, sterile gowns, sterile gloves, sterile drape, hand hygiene and skin antiseptic. A timeout was performed prior to the initiation of the procedure. Ultrasound survey of the right inguinal region was performed with images stored and sent to PACs. A single wall puncture needle was used access the right common femoral artery under ultrasound guidance with saline syringe. With venous blood flow returned, a Bentson wire was observed to enter the common femoral vein and the IVC under fluoroscopy. The needle was removed, and a small incision was made, and 9 [place] dilator was passed over the Bentson wire. A 10 French TIPS sheath was then passed over the Bentson wire after manually creating a significant leftward current on the catheter. The inner dilator and wire were removed, and a 5 [place] cobra catheter was passed over the Bentson wire into the IVC. Cobra catheter was used to select the left renal vein orifice. Combination of the Bentson wire, Glidewire, Rosen wire were used to gain purchased into the renal vein, with renal vein limited venogram performed to identify the in flowing gastro renal shunt. With a safety curve on a stiff Amplatz wire position into superior pole renal vein, the TIPS sheath was advanced into the left renal vein over the Cobra catheter. Pullback reinforced straight sheath selection technique (PRESSS) was performed, and as the tip sheath was withdrawn, contrast was used to identified the inflow of the gastro renal shunt. With the coaxial Amplatz wire in position, a combination  of a standard Kumpe the catheter and a 4 French angled glide catheter were used to cannulate the orifice of the gastro renal shunt, and 11 o'clock position from the in flowing shunt opposite the adrenal vein inflow. Once the Glidewire was advanced into the shunt, the glide catheter was advanced, and the Glidewire was exchanged for a stiff Amplatz wire with a safety curve on the tip. The renal vein buddy wire was withdrawn, and the TIPS sheath was advanced into the gastro renal shunt. The glide catheter was withdrawn and then a balloon occlusion  catheter was advanced over the Amplatz wire into the gastro renal shunt. Amplatz wire was then withdrawn. Angiogram was then performed to confirmed location within the gastro renal shunt outflow. Balloon was inflated on the balloon occlusion catheter and was withdrawn to the first encountered web to achieve hemostasis. Standard angiogram was performed.  CO2 angiogram was also performed. A penumbra Lantern microcatheter and a fathom wire were navigated into the shunt. Once we confirmed location within the outflow with standard angiogram, treatment was initiated. A foamed solution of detergent/sclerosant was mixed in a standard Tessari method, with 1:2:3 ratio of ethiodized oil: sodium tetradecyl sulfate: Room air. The sclerosed sent was infused under fluoroscopic guidance, with observation of reflux into the afferent veins. Once reflux was identified into the posterior gastric vein, sclerosing was completed. The micro catheter was withdrawn to the tip of the balloon occlusion catheter, and coil mass was deposited with deployment of multiple Ruby coils. 4 standard Ruby coils and 1 soft ruby coil deployed. After 20 minutes, the balloon was deflated under fluoroscopy to observe the foam mass. Once we were confident there was no migration, balloon was withdrawn and the sheath was withdrawn. Hemostasis was achieved with manual compression. Patient remained hemodynamically stable  throughout. No complications were encountered and no significant blood loss was encountered. IMPRESSION: Status post coil assisted balloon occluded retrograde trans venous obliteration (BRTO) for treatment of bleeding gastric varices. Signed, Dulcy Fanny. Earleen Newport, DO [facility] Inter[facility] Radiology Specialists [facility] Radiology Electronically Signed   By: Corrie Mckusick D.O.   On: 04/13/2016 15:06    ASSESSMENT/PLAN:    Breast cancer of upper-outer quadrant of right female breast [facility]) Patient continues to take Femara oral therapy as directed.  Patient is scheduled to return for labs on 05/02/2016.  She is scheduled for a follow-up visit at the [facility] on 05/09/2016.  GI bleed Patient presented to the [facility] today with complaint of fatigue and nausea.  Patient states she vomited once this past week after eating [place] food.  Patient denies any chest pain, chest pressure, shortness breath, or pain with inspiration.  She reports that her stools have been much darker and almost black at times.  Patient denies any recent fevers or chills.  Exam today reveals lungs clear bilaterally; and abdomen soft and nontender.  Bowel sounds are positive in all 4 quads.  There was no flank pain noted on exam.  Blood counts obtained today reveal a WBC of 6.2, ANC 3.9, hemoglobin has decreased from 14.6, down to 8.4, and platelet count is 119.  Hemoccult stool was positive.  Patient has seen Dr. Oletta Lamas gastroenterologist in the past for colonoscopy.  Was able to contact Dr.Magod gastroenterologist (partner to Dr. Oletta Lamas); who advised that patient be transported to the [facility] endoscopy suite immediately for further evaluation.  Patient will be transported to the endoscopy suite per orders.  Dr. Jana Hakim has called and spoken to a hospitalist for planned admission.  Following the endoscopy.       Patient stated understanding of all instructions; and was in agreement with this plan  of care. The patient knows to call the clinic with any problems, questions or concerns.   Total time spent with patient was 40 minutes;  with greater than 75 percent of that time spent in face to face counseling regarding patient's symptoms,  and coordination of care and follow up.  Disclaimer:This dictation was prepared with Dragon/digital dictation along with [employer]. Any transcriptional errors that result from this  process are unintentional.  Drue Second, NP 04/15/2016

## 2016-04-15 NOTE — Discharge Summary (Signed)
Physician Discharge Summary  Nicole [name] [id number] DOB: Dec 04, 1956 DOA: 04/11/2016 Oncology: Dr. Lurline Del  Admit date: 04/11/2016 Discharge date: 04/15/2016  Recommendations for Outpatient Follow-up:  1. Check CBC during next appointment in cancer center or with PCP  Discharge Diagnoses:  Principal Problem:   Acute upper GI bleed Active Problems:   Breast cancer metastasized to lung (HCC)   Esophageal varices (HCC)   Acute blood loss anemia   Discharge Condition: stable   Diet recommendation: as tolerated   History of present illness:  60 y.o. female with a history of breast cancer, s/p chemo and radiation who presented to hospital with melanotic stool, fatigue. She was referred for admission by oncology. GI was consulted and EGD done. EGD showed oozing gastric varices as well as extensive esophageal and gastric varices. A CT scan was then obtained that revealed a large gastrorenal shunt with extensive varices. She was felt to be a good candidate for a BRTO.Pt transferred to [facility] for BRTO.   Hospital Course:   Assessment & Plan:  Acute upper GI bleed due to esophageal varices ([place]): - Possibly related to chemoradiation therapy - Appreciate GI and IR following - S/p BRTO 4/16 - No bleeding - hemoglobin stable, 10.1  Breast cancer metastasized to lung (HCC) - Continue Femara   Anemia of chronic disease secondary to antineoplastic therapy / Thrombocytopenia - Sequela of chemo - Hgb 10.1, platelets 63-67 range    DVT prophylaxis: SCD's bilaterally  Code Status: full code  Family Communication: no family at the bedside   Consultants:   GI  IR  Procedures:   BRTO 04/13/2016  Antimicrobials:   None   Signed:  Leisa Lenz, MD  [facility] 04/15/2016, 10:51 AM  Pager #: [phone]  Time spent in minutes: less than 30 minutes   Discharge Exam: Filed Vitals:   04/14/16 2131 04/15/16 0531  BP: 144/67 112/63  Pulse: 86 89   Temp: 99.7 F (37.6 C) 98.7 F (37.1 C)  Resp: 18 16   Filed Vitals:   04/14/16 0507 04/14/16 1355 04/14/16 2131 04/15/16 0531  BP: 100/57 125/62 144/67 112/63  Pulse: 79 92 86 89  Temp: 98.6 F (37 C) 98.5 F (36.9 C) 99.7 F (37.6 C) 98.7 F (37.1 C)  TempSrc:   Oral Oral  Resp: 18 19 18 16   Height:      Weight:      SpO2: 95% 98% 97% 94%    General: Pt is alert, follows commands appropriately, not in acute distress Cardiovascular: Regular rate and rhythm, S1/S2 +, no murmurs Respiratory: Clear to auscultation bilaterally, no wheezing, no crackles, no rhonchi Abdominal: Soft, non tender, non distended, bowel sounds +, no guarding Extremities: no edema, no cyanosis, pulses palpable bilaterally DP and PT Neuro: Grossly nonfocal  Discharge Instructions  Discharge Instructions    Call MD for:  persistant dizziness or light-headedness    Complete by:  As directed      Call MD for:  persistant nausea and vomiting    Complete by:  As directed      Call MD for:  severe uncontrolled pain    Complete by:  As directed      Diet - low sodium heart healthy    Complete by:  As directed      Increase activity slowly    Complete by:  As directed             Medication List    TAKE these medications  alendronate 70 MG tablet  Commonly known as:  FOSAMAX  TAKE 1 TABLET BY MOUTH ONCE A WEEK     CALTRATE 600 PO  Take by mouth daily.     letrozole 2.5 MG tablet  Commonly known as:  FEMARA  Take 1 tablet (2.5 mg total) by mouth daily.     venlafaxine XR 75 MG 24 hr capsule  Commonly known as:  EFFEXOR-XR  Take 1 capsule (75 mg total) by mouth daily.     VITAMIN D PO  Take 4,000 Units by mouth daily.           Follow-up Information    Follow up with WAGNER, JAIME, DO In 4 weeks.   Specialty:  Interventional Radiology   Why:  pt will hear from IR clinic for follow up with Dr Earleen Newport 4 weeks   Contact information:   [address] [place]  [place] [address] [phone]        The results of significant diagnostics from this hospitalization (including imaging, microbiology, ancillary and laboratory) are listed below for reference.    Significant Diagnostic Studies: Ct Abdomen Pelvis W Wo Contrast  04/11/2016  CLINICAL DATA:  60 year old female with anemia and gastric varices on endoscopy. Evaluate for BRTO candidacy. EXAM: CT ABDOMEN AND PELVIS WITHOUT AND WITH CONTRAST TECHNIQUE: Multidetector CT imaging of the abdomen and pelvis was performed following the standard protocol before and following the bolus administration of intravenous contrast. CONTRAST:  133mL ISOVUE-300 IOPAMIDOL (ISOVUE-300) INJECTION 61% COMPARISON:  Prior CT scan of the abdomen and pelvis 12/02/2012 FINDINGS: Lower chest: No acute findings. The intracardiac blood pool is hypodense relative to the adjacent myocardium consistent with anemia. Hepatobiliary: The liver does not appear overtly cirrhotic. There is no discrete solid hepatic lesion. Stable cyst in the posterior aspect of hepatic segment 6. The portal vein is normal in size. The hepatic veins are normal in size and patent. Please see vascular section below for further detail. Gallbladder is unremarkable. No intra or extrahepatic biliary ductal dilatation. Pancreas: No mass, inflammatory changes, or other significant abnormality. Spleen: Within normal limits in size and appearance. Adrenals/Urinary Tract: No masses identified. No evidence of hydronephrosis. Stomach/Bowel: No evidence of obstruction, inflammatory process, or abnormal fluid collections. Vascular/Lymphatic: Large gastrorenal shunt resulting in extensive gastric varices in the gastric fundus and cardia. This is almost certainly the source of the patient's bleeding. Additionally, there are small esophageal varices. The primary afferent inflow is via the posterior gastric vein. Additionally, there are contributions from small short gastric veins. No suspicious  adenopathy. Reproductive: No mass or other significant abnormality. Other: None. Musculoskeletal: Chronic appearing L1 compression fracture with approximately 40% height loss anteriorly. No significant retropulsion or exaggerated kyphosis. Multilevel degenerative disc disease most noticeable at L4-L5 and L5-S1. No acute fracture or aggressive appearing lytic or blastic osseous lesion. IMPRESSION: 1. Large gastrorenal shunt resulting in extensive gastric varices. The primary afferent inflow is via the posterior gastric vein with lesser contributions from the short gastric veins. The anatomy is amenable to balloon occluded retrograde transvenous obliteration (BRTO). Please see coronal reformatted images for further anatomic detail. 2. Surprisingly, the liver does not appear overtly cirrhotic. However, the presence of a gastro renal shunt is highly suggestive of underlying cirrhosis. 3. The splenic vein remains patent. 4. Chronic appearing L1 compression fracture with approximately 40% height loss. 5. Multilevel degenerative disc disease. 6. Anemia. Signed, Criselda Peaches, MD [facility] Radiology Electronically Signed   By:  Jacqulynn Cadet M.D.   On: 04/11/2016 17:30   Ir Angiogram Selective Each Additional Vessel  04/13/2016  CLINICAL DATA:  60 year old female with a history of bleeding gastric varices. She has no significant ascites and low risk esophageal varices. Balloon occluded retrograde trans venous obliteration is indicated as the solution for bleeding gastric varices. EXAM: IR EMBO ART VEN HEMORR LYMPH EXTRAV INC GUIDE ROADMAPPING; ADDITIONAL ARTERIOGRAPHY; IR ULTRASOUND GUIDANCE VASC ACCESS RIGHT; LEFT RENAL VENOGRAPHY; ARTERIOGRAPHY MEDICATIONS: No additional ANESTHESIA/SEDATION: General - as administered by the Anesthesia department CONTRAST:  1 ISOVUE-300 IOPAMIDOL (ISOVUE-300) INJECTION 61% FLUOROSCOPY TIME:  Fluoroscopy Time: 49 minutes 0  seconds COMPLICATIONS: None PROCEDURE: Informed written consent was obtained from the patient and the patient's family after a thorough discussion of the procedural risks, benefits and alternatives. Specific risks that were addressed regarding BRTO include, bleeding, infection, contrast reaction, kidney injury, need for further procedure or surgery including TIPS and/ or endoscopy, pulmonary embolism, stroke, worsening of ascites, worsening of esophageal varices, paradoxical liver failure, varix rupture, cardiopulmonary collapse, death. All questions were addressed. Maximal Sterile Barrier Technique was utilized including caps, mask, sterile gowns, sterile gloves, sterile drape, hand hygiene and skin antiseptic. A timeout was performed prior to the initiation of the procedure. Ultrasound survey of the right inguinal region was performed with images stored and sent to PACs. A single wall puncture needle was used access the right common femoral artery under ultrasound guidance with saline syringe. With venous blood flow returned, a Bentson wire was observed to enter the common femoral vein and the IVC under fluoroscopy. The needle was removed, and a small incision was made, and 9 [place] dilator was passed over the Bentson wire. A 10 French TIPS sheath was then passed over the Bentson wire after manually creating a significant leftward current on the catheter. The inner dilator and wire were removed, and a 5 [place] cobra catheter was passed over the Bentson wire into the IVC. Cobra catheter was used to select the left renal vein orifice. Combination of the Bentson wire, Glidewire, Rosen wire were used to gain purchased into the renal vein, with renal vein limited venogram performed to identify the in flowing gastro renal shunt. With a safety curve on a stiff Amplatz wire position into superior pole renal vein, the TIPS sheath was advanced into the left renal vein over the Cobra catheter. Pullback reinforced straight  sheath selection technique (PRESSS) was performed, and as the tip sheath was withdrawn, contrast was used to identified the inflow of the gastro renal shunt. With the coaxial Amplatz wire in position, a combination of a standard Kumpe the catheter and a 4 French angled glide catheter were used to cannulate the orifice of the gastro renal shunt, and 11 o'clock position from the in flowing shunt opposite the adrenal vein inflow. Once the Glidewire was advanced into the shunt, the glide catheter was advanced, and the Glidewire was exchanged for a stiff Amplatz wire with a safety curve on the tip. The renal vein buddy wire was withdrawn, and the TIPS sheath was advanced into the gastro renal shunt. The glide catheter was withdrawn and then a balloon occlusion catheter was advanced over the Amplatz wire into the gastro renal shunt. Amplatz wire was then withdrawn. Angiogram was then performed to confirmed location within the gastro renal shunt outflow. Balloon was inflated on the balloon occlusion catheter and was withdrawn to the first encountered web to achieve hemostasis. Standard angiogram was performed.  CO2 angiogram was also performed. A penumbra  Lantern microcatheter and a fathom wire were navigated into the shunt. Once we confirmed location within the outflow with standard angiogram, treatment was initiated. A foamed solution of detergent/sclerosant was mixed in a standard Tessari method, with 1:2:3 ratio of ethiodized oil: sodium tetradecyl sulfate: Room air. The sclerosed sent was infused under fluoroscopic guidance, with observation of reflux into the afferent veins. Once reflux was identified into the posterior gastric vein, sclerosing was completed. The micro catheter was withdrawn to the tip of the balloon occlusion catheter, and coil mass was deposited with deployment of multiple Ruby coils. 4 standard Ruby coils and 1 soft ruby coil deployed. After 20 minutes, the balloon was deflated under fluoroscopy  to observe the foam mass. Once we were confident there was no migration, balloon was withdrawn and the sheath was withdrawn. Hemostasis was achieved with manual compression. Patient remained hemodynamically stable throughout. No complications were encountered and no significant blood loss was encountered. IMPRESSION: Status post coil assisted balloon occluded retrograde trans venous obliteration (BRTO) for treatment of bleeding gastric varices. Signed, Dulcy Fanny. Earleen Newport, DO [facility] Radiology Electronically Signed   By: Corrie Mckusick D.O.   On: 04/13/2016 15:06   Ir Venogram Renal Uni Left  04/13/2016  CLINICAL DATA:  60 year old female with a history of bleeding gastric varices. She has no significant ascites and low risk esophageal varices. Balloon occluded retrograde trans venous obliteration is indicated as the solution for bleeding gastric varices. EXAM: IR EMBO ART VEN HEMORR LYMPH EXTRAV INC GUIDE ROADMAPPING; ADDITIONAL ARTERIOGRAPHY; IR ULTRASOUND GUIDANCE VASC ACCESS RIGHT; LEFT RENAL VENOGRAPHY; ARTERIOGRAPHY MEDICATIONS: No additional ANESTHESIA/SEDATION: General - as administered by the Anesthesia department CONTRAST:  1 ISOVUE-300 IOPAMIDOL (ISOVUE-300) INJECTION 61% FLUOROSCOPY TIME:  Fluoroscopy Time: 49 minutes 0 seconds COMPLICATIONS: None PROCEDURE: Informed written consent was obtained from the patient and the patient's family after a thorough discussion of the procedural risks, benefits and alternatives. Specific risks that were addressed regarding BRTO include, bleeding, infection, contrast reaction, kidney injury, need for further procedure or surgery including TIPS and/ or endoscopy, pulmonary embolism, stroke, worsening of ascites, worsening of esophageal varices, paradoxical liver failure, varix rupture, cardiopulmonary collapse, death. All questions were addressed. Maximal Sterile Barrier Technique was utilized including caps, mask,  sterile gowns, sterile gloves, sterile drape, hand hygiene and skin antiseptic. A timeout was performed prior to the initiation of the procedure. Ultrasound survey of the right inguinal region was performed with images stored and sent to PACs. A single wall puncture needle was used access the right common femoral artery under ultrasound guidance with saline syringe. With venous blood flow returned, a Bentson wire was observed to enter the common femoral vein and the IVC under fluoroscopy. The needle was removed, and a small incision was made, and 9 [place] dilator was passed over the Bentson wire. A 10 French TIPS sheath was then passed over the Bentson wire after manually creating a significant leftward current on the catheter. The inner dilator and wire were removed, and a 5 [place] cobra catheter was passed over the Bentson wire into the IVC. Cobra catheter was used to select the left renal vein orifice. Combination of the Bentson wire, Glidewire, Rosen wire were used to gain purchased into the renal vein, with renal vein limited venogram performed to identify the in flowing gastro renal shunt. With a safety curve on a stiff Amplatz wire position into superior pole renal vein, the TIPS sheath was advanced into the left renal vein over the [employer]  catheter. Pullback reinforced straight sheath selection technique (PRESSS) was performed, and as the tip sheath was withdrawn, contrast was used to identified the inflow of the gastro renal shunt. With the coaxial Amplatz wire in position, a combination of a standard Kumpe the catheter and a 4 French angled glide catheter were used to cannulate the orifice of the gastro renal shunt, and 11 o'clock position from the in flowing shunt opposite the adrenal vein inflow. Once the Glidewire was advanced into the shunt, the glide catheter was advanced, and the Glidewire was exchanged for a stiff Amplatz wire with a safety curve on the tip. The renal vein buddy wire was withdrawn,  and the TIPS sheath was advanced into the gastro renal shunt. The glide catheter was withdrawn and then a balloon occlusion catheter was advanced over the Amplatz wire into the gastro renal shunt. Amplatz wire was then withdrawn. Angiogram was then performed to confirmed location within the gastro renal shunt outflow. Balloon was inflated on the balloon occlusion catheter and was withdrawn to the first encountered web to achieve hemostasis. Standard angiogram was performed.  CO2 angiogram was also performed. A penumbra Lantern microcatheter and a fathom wire were navigated into the shunt. Once we confirmed location within the outflow with standard angiogram, treatment was initiated. A foamed solution of detergent/sclerosant was mixed in a standard Tessari method, with 1:2:3 ratio of ethiodized oil: sodium tetradecyl sulfate: Room air. The sclerosed sent was infused under fluoroscopic guidance, with observation of reflux into the afferent veins. Once reflux was identified into the posterior gastric vein, sclerosing was completed. The micro catheter was withdrawn to the tip of the balloon occlusion catheter, and coil mass was deposited with deployment of multiple Ruby coils. 4 standard Ruby coils and 1 soft ruby coil deployed. After 20 minutes, the balloon was deflated under fluoroscopy to observe the foam mass. Once we were confident there was no migration, balloon was withdrawn and the sheath was withdrawn. Hemostasis was achieved with manual compression. Patient remained hemodynamically stable throughout. No complications were encountered and no significant blood loss was encountered. IMPRESSION: Status post coil assisted balloon occluded retrograde trans venous obliteration (BRTO) for treatment of bleeding gastric varices. Signed, Dulcy Fanny. Earleen Newport, DO [facility] Radiology Electronically Signed   By: Corrie Mckusick D.O.   On: 04/13/2016 15:06   Ir Angiogram Follow  Up Study  04/13/2016  CLINICAL DATA:  60 year old female with a history of bleeding gastric varices. She has no significant ascites and low risk esophageal varices. Balloon occluded retrograde trans venous obliteration is indicated as the solution for bleeding gastric varices. EXAM: IR EMBO ART VEN HEMORR LYMPH EXTRAV INC GUIDE ROADMAPPING; ADDITIONAL ARTERIOGRAPHY; IR ULTRASOUND GUIDANCE VASC ACCESS RIGHT; LEFT RENAL VENOGRAPHY; ARTERIOGRAPHY MEDICATIONS: No additional ANESTHESIA/SEDATION: General - as administered by the Anesthesia department CONTRAST:  1 ISOVUE-300 IOPAMIDOL (ISOVUE-300) INJECTION 61% FLUOROSCOPY TIME:  Fluoroscopy Time: 49 minutes 0 seconds COMPLICATIONS: None PROCEDURE: Informed written consent was obtained from the patient and the patient's family after a thorough discussion of the procedural risks, benefits and alternatives. Specific risks that were addressed regarding BRTO include, bleeding, infection, contrast reaction, kidney injury, need for further procedure or surgery including TIPS and/ or endoscopy, pulmonary embolism, stroke, worsening of ascites, worsening of esophageal varices, paradoxical liver failure, varix rupture, cardiopulmonary collapse, death. All questions were addressed. Maximal Sterile Barrier Technique was utilized including caps, mask, sterile gowns, sterile gloves, sterile drape, hand hygiene and skin antiseptic. A timeout was performed prior to  the initiation of the procedure. Ultrasound survey of the right inguinal region was performed with images stored and sent to PACs. A single wall puncture needle was used access the right common femoral artery under ultrasound guidance with saline syringe. With venous blood flow returned, a Bentson wire was observed to enter the common femoral vein and the IVC under fluoroscopy. The needle was removed, and a small incision was made, and 9 [place] dilator was passed over the Bentson wire. A 10 French TIPS sheath was then passed  over the Bentson wire after manually creating a significant leftward current on the catheter. The inner dilator and wire were removed, and a 5 [place] cobra catheter was passed over the Bentson wire into the IVC. Cobra catheter was used to select the left renal vein orifice. Combination of the Bentson wire, Glidewire, Rosen wire were used to gain purchased into the renal vein, with renal vein limited venogram performed to identify the in flowing gastro renal shunt. With a safety curve on a stiff Amplatz wire position into superior pole renal vein, the TIPS sheath was advanced into the left renal vein over the Cobra catheter. Pullback reinforced straight sheath selection technique (PRESSS) was performed, and as the tip sheath was withdrawn, contrast was used to identified the inflow of the gastro renal shunt. With the coaxial Amplatz wire in position, a combination of a standard Kumpe the catheter and a 4 French angled glide catheter were used to cannulate the orifice of the gastro renal shunt, and 11 o'clock position from the in flowing shunt opposite the adrenal vein inflow. Once the Glidewire was advanced into the shunt, the glide catheter was advanced, and the Glidewire was exchanged for a stiff Amplatz wire with a safety curve on the tip. The renal vein buddy wire was withdrawn, and the TIPS sheath was advanced into the gastro renal shunt. The glide catheter was withdrawn and then a balloon occlusion catheter was advanced over the Amplatz wire into the gastro renal shunt. Amplatz wire was then withdrawn. Angiogram was then performed to confirmed location within the gastro renal shunt outflow. Balloon was inflated on the balloon occlusion catheter and was withdrawn to the first encountered web to achieve hemostasis. Standard angiogram was performed.  CO2 angiogram was also performed. A penumbra Lantern microcatheter and a fathom wire were navigated into the shunt. Once we confirmed location within the outflow with  standard angiogram, treatment was initiated. A foamed solution of detergent/sclerosant was mixed in a standard Tessari method, with 1:2:3 ratio of ethiodized oil: sodium tetradecyl sulfate: Room air. The sclerosed sent was infused under fluoroscopic guidance, with observation of reflux into the afferent veins. Once reflux was identified into the posterior gastric vein, sclerosing was completed. The micro catheter was withdrawn to the tip of the balloon occlusion catheter, and coil mass was deposited with deployment of multiple Ruby coils. 4 standard Ruby coils and 1 soft ruby coil deployed. After 20 minutes, the balloon was deflated under fluoroscopy to observe the foam mass. Once we were confident there was no migration, balloon was withdrawn and the sheath was withdrawn. Hemostasis was achieved with manual compression. Patient remained hemodynamically stable throughout. No complications were encountered and no significant blood loss was encountered. IMPRESSION: Status post coil assisted balloon occluded retrograde trans venous obliteration (BRTO) for treatment of bleeding gastric varices. Signed, Dulcy Fanny. Earleen Newport, DO [facility] Radiology Electronically Signed   By: Corrie Mckusick D.O.   On: 04/13/2016 15:06   Ir  US Guide Vasc Access Right  04/13/2016  CLINICAL DATA:  60 year old female with a history of bleeding gastric varices. She has no significant ascites and low risk esophageal varices. Balloon occluded retrograde trans venous obliteration is indicated as the solution for bleeding gastric varices. EXAM: IR EMBO ART VEN HEMORR LYMPH EXTRAV INC GUIDE ROADMAPPING; ADDITIONAL ARTERIOGRAPHY; IR ULTRASOUND GUIDANCE VASC ACCESS RIGHT; LEFT RENAL VENOGRAPHY; ARTERIOGRAPHY MEDICATIONS: No additional ANESTHESIA/SEDATION: General - as administered by the Anesthesia department CONTRAST:  1 ISOVUE-300 IOPAMIDOL (ISOVUE-300) INJECTION 61% FLUOROSCOPY TIME:  Fluoroscopy  Time: 49 minutes 0 seconds COMPLICATIONS: None PROCEDURE: Informed written consent was obtained from the patient and the patient's family after a thorough discussion of the procedural risks, benefits and alternatives. Specific risks that were addressed regarding BRTO include, bleeding, infection, contrast reaction, kidney injury, need for further procedure or surgery including TIPS and/ or endoscopy, pulmonary embolism, stroke, worsening of ascites, worsening of esophageal varices, paradoxical liver failure, varix rupture, cardiopulmonary collapse, death. All questions were addressed. Maximal Sterile Barrier Technique was utilized including caps, mask, sterile gowns, sterile gloves, sterile drape, hand hygiene and skin antiseptic. A timeout was performed prior to the initiation of the procedure. Ultrasound survey of the right inguinal region was performed with images stored and sent to PACs. A single wall puncture needle was used access the right common femoral artery under ultrasound guidance with saline syringe. With venous blood flow returned, a Bentson wire was observed to enter the common femoral vein and the IVC under fluoroscopy. The needle was removed, and a small incision was made, and 9 [place] dilator was passed over the Bentson wire. A 10 French TIPS sheath was then passed over the Bentson wire after manually creating a significant leftward current on the catheter. The inner dilator and wire were removed, and a 5 [place] cobra catheter was passed over the Bentson wire into the IVC. Cobra catheter was used to select the left renal vein orifice. Combination of the Bentson wire, Glidewire, Rosen wire were used to gain purchased into the renal vein, with renal vein limited venogram performed to identify the in flowing gastro renal shunt. With a safety curve on a stiff Amplatz wire position into superior pole renal vein, the TIPS sheath was advanced into the left renal vein over the Cobra catheter. Pullback  reinforced straight sheath selection technique (PRESSS) was performed, and as the tip sheath was withdrawn, contrast was used to identified the inflow of the gastro renal shunt. With the coaxial Amplatz wire in position, a combination of a standard Kumpe the catheter and a 4 French angled glide catheter were used to cannulate the orifice of the gastro renal shunt, and 11 o'clock position from the in flowing shunt opposite the adrenal vein inflow. Once the Glidewire was advanced into the shunt, the glide catheter was advanced, and the Glidewire was exchanged for a stiff Amplatz wire with a safety curve on the tip. The renal vein buddy wire was withdrawn, and the TIPS sheath was advanced into the gastro renal shunt. The glide catheter was withdrawn and then a balloon occlusion catheter was advanced over the Amplatz wire into the gastro renal shunt. Amplatz wire was then withdrawn. Angiogram was then performed to confirmed location within the gastro renal shunt outflow. Balloon was inflated on the balloon occlusion catheter and was withdrawn to the first encountered web to achieve hemostasis. Standard angiogram was performed.  CO2 angiogram was also performed. A penumbra Lantern microcatheter and a fathom wire were navigated into the shunt. Once  we confirmed location within the outflow with standard angiogram, treatment was initiated. A foamed solution of detergent/sclerosant was mixed in a standard Tessari method, with 1:2:3 ratio of ethiodized oil: sodium tetradecyl sulfate: Room air. The sclerosed sent was infused under fluoroscopic guidance, with observation of reflux into the afferent veins. Once reflux was identified into the posterior gastric vein, sclerosing was completed. The micro catheter was withdrawn to the tip of the balloon occlusion catheter, and coil mass was deposited with deployment of multiple Ruby coils. 4 standard Ruby coils and 1 soft ruby coil deployed. After 20 minutes, the balloon was deflated  under fluoroscopy to observe the foam mass. Once we were confident there was no migration, balloon was withdrawn and the sheath was withdrawn. Hemostasis was achieved with manual compression. Patient remained hemodynamically stable throughout. No complications were encountered and no significant blood loss was encountered. IMPRESSION: Status post coil assisted balloon occluded retrograde trans venous obliteration (BRTO) for treatment of bleeding gastric varices. Signed, Dulcy Fanny. Earleen Newport, DO [facility] Radiology Electronically Signed   By: Corrie Mckusick D.O.   On: 04/13/2016 15:06   Ct Abd Wo & W Cm  04/14/2016  CLINICAL DATA:  60 year old female with a history of hemorrhaging gastric varices an low risk esophageal varices. 24 hours status post coil assisted balloon retrograde trans venous obliteration. EXAM: CT ABDOMEN WITH CONTRAST TECHNIQUE: Multidetector CT imaging of the abdomen was performed using the standard protocol following bolus administration of intravenous contrast. CONTRAST:  1 ISOVUE-300 IOPAMIDOL (ISOVUE-300) INJECTION 61% COMPARISON:  CT 04/11/2016 FINDINGS: Lower chest: Surgical changes of right mastectomy. Heart size within normal limits.  No pericardial fluid/thickening. No lower mediastinal adenopathy. Unremarkable appearance of the distal esophagus. No hiatal hernia. Trace bilateral pleural effusions with associated minimal atelectasis. No confluent airspace disease. Abdomen: Similar appearance of the liver 2 prior, with benign cystic lesion in segment 6. As was noted on comparison CT, there is no significant nodularity or enlargement of segment 1, as usually seen with imaging diagnosis of cirrhosis. Cranial caudal span of the spleen measures approximately 14 cm. Unremarkable appearance of the adrenal glands. Contracted gallbladder. Unremarkable appearance of the pancreas. Unremarkable bilateral kidneys. Unremarkable visualized course of the  bilateral ureters. Small volume of ascitic fluid. Unremarkable appearance of stomach, and visualized small bowel. Diverticular disease of the colon again visualized. No abnormally distended small bowel or colon. Small bowel, stomach, and colonic wall unremarkable. Post interventional changes of gastric variceal treatment with radio-opaque sclerosant filling the previous GV network. No residual gastric varices visualized on the study. Similar appearance of esophageal varices in the lower esophagus. The most proximal aspect of the gastro renal shunt has thrombosed, with trace thrombus extending along the roof of the left renal vein to the ostium with the IVC. Left renal vein remains widely patent. There is trace sclerosing agent within distal hepatic veins of the left and right liver. Widely patent portal system, including portal vein, splenic vein, superior mesenteric vein, inferior mesenteric vein, and contributing inflow. Posterior gastric vein and short gastric veins have been treated, with no further contribution to the varices network. Unremarkable appearance of the abdominal aorta with minimal atherosclerotic changes. Patency of celiac artery, superior mesenteric artery, and inferior mesenteric artery maintained. Bilateral renal arteries maintained. Similar configuration of compression fracture of L1. Degenerative changes of the visualized spine, unchanged from prior. IMPRESSION: Status post BRTO, with complete obliteration of Type B gastric variceal network and no further contributing inflow. Trace ascites, potentially reactive  status post treatment. Thrombosis of the spleno-gastro-renal shunt, with tiny amount of thrombus extending from the shunt inflow along roof of the left renal vein, with wide patency of left renal vein outflow maintained. IVC widely patent. Trace radiopaque sclerosant within distal portal vein branches of the bilateral liver lobes. Portal system patency is maintained. Splenomegaly,  compatible with portal venous hypertension. Signed, Dulcy Fanny. Earleen Newport, DO [facility] Radiology Electronically Signed   By: Corrie Mckusick D.O.   On: 04/14/2016 14:40   [facility] Guide Roadmapping  04/13/2016  CLINICAL DATA:  60 year old female with a history of bleeding gastric varices. She has no significant ascites and low risk esophageal varices. Balloon occluded retrograde trans venous obliteration is indicated as the solution for bleeding gastric varices. EXAM: IR EMBO ART VEN HEMORR LYMPH EXTRAV INC GUIDE ROADMAPPING; ADDITIONAL ARTERIOGRAPHY; IR ULTRASOUND GUIDANCE VASC ACCESS RIGHT; LEFT RENAL VENOGRAPHY; ARTERIOGRAPHY MEDICATIONS: No additional ANESTHESIA/SEDATION: General - as administered by the Anesthesia department CONTRAST:  1 ISOVUE-300 IOPAMIDOL (ISOVUE-300) INJECTION 61% FLUOROSCOPY TIME:  Fluoroscopy Time: 49 minutes 0 seconds COMPLICATIONS: None PROCEDURE: Informed written consent was obtained from the patient and the patient's family after a thorough discussion of the procedural risks, benefits and alternatives. Specific risks that were addressed regarding BRTO include, bleeding, infection, contrast reaction, kidney injury, need for further procedure or surgery including TIPS and/ or endoscopy, pulmonary embolism, stroke, worsening of ascites, worsening of esophageal varices, paradoxical liver failure, varix rupture, cardiopulmonary collapse, death. All questions were addressed. Maximal Sterile Barrier Technique was utilized including caps, mask, sterile gowns, sterile gloves, sterile drape, hand hygiene and skin antiseptic. A timeout was performed prior to the initiation of the procedure. Ultrasound survey of the right inguinal region was performed with images stored and sent to PACs. A single wall puncture needle was used access the right common femoral artery under ultrasound guidance with saline syringe. With  venous blood flow returned, a Bentson wire was observed to enter the common femoral vein and the IVC under fluoroscopy. The needle was removed, and a small incision was made, and 9 [place] dilator was passed over the Bentson wire. A 10 French TIPS sheath was then passed over the Bentson wire after manually creating a significant leftward current on the catheter. The inner dilator and wire were removed, and a 5 [place] cobra catheter was passed over the Bentson wire into the IVC. Cobra catheter was used to select the left renal vein orifice. Combination of the Bentson wire, Glidewire, Rosen wire were used to gain purchased into the renal vein, with renal vein limited venogram performed to identify the in flowing gastro renal shunt. With a safety curve on a stiff Amplatz wire position into superior pole renal vein, the TIPS sheath was advanced into the left renal vein over the Cobra catheter. Pullback reinforced straight sheath selection technique (PRESSS) was performed, and as the tip sheath was withdrawn, contrast was used to identified the inflow of the gastro renal shunt. With the coaxial Amplatz wire in position, a combination of a standard Kumpe the catheter and a 4 French angled glide catheter were used to cannulate the orifice of the gastro renal shunt, and 11 o'clock position from the in flowing shunt opposite the adrenal vein inflow. Once the Glidewire was advanced into the shunt, the glide catheter was advanced, and the Glidewire was exchanged for a stiff Amplatz wire with a safety curve on the tip. The renal vein buddy wire  was withdrawn, and the TIPS sheath was advanced into the gastro renal shunt. The glide catheter was withdrawn and then a balloon occlusion catheter was advanced over the Amplatz wire into the gastro renal shunt. Amplatz wire was then withdrawn. Angiogram was then performed to confirmed location within the gastro renal shunt outflow. Balloon was inflated on the balloon occlusion catheter  and was withdrawn to the first encountered web to achieve hemostasis. Standard angiogram was performed.  CO2 angiogram was also performed. A penumbra Lantern microcatheter and a fathom wire were navigated into the shunt. Once we confirmed location within the outflow with standard angiogram, treatment was initiated. A foamed solution of detergent/sclerosant was mixed in a standard Tessari method, with 1:2:3 ratio of ethiodized oil: sodium tetradecyl sulfate: Room air. The sclerosed sent was infused under fluoroscopic guidance, with observation of reflux into the afferent veins. Once reflux was identified into the posterior gastric vein, sclerosing was completed. The micro catheter was withdrawn to the tip of the balloon occlusion catheter, and coil mass was deposited with deployment of multiple Ruby coils. 4 standard Ruby coils and 1 soft ruby coil deployed. After 20 minutes, the balloon was deflated under fluoroscopy to observe the foam mass. Once we were confident there was no migration, balloon was withdrawn and the sheath was withdrawn. Hemostasis was achieved with manual compression. Patient remained hemodynamically stable throughout. No complications were encountered and no significant blood loss was encountered. IMPRESSION: Status post coil assisted balloon occluded retrograde trans venous obliteration (BRTO) for treatment of bleeding gastric varices. Signed, Dulcy Fanny. Earleen Newport, DO [facility] Radiology Electronically Signed   By: Corrie Mckusick D.O.   On: 04/13/2016 15:06    Microbiology: Recent Results (from the past 240 hour(s))  MRSA PCR Screening     Status: Abnormal   Collection Time: 04/11/16  5:51 PM  Result Value Ref Range Status   MRSA by PCR POSITIVE (A) NEGATIVE Final    Comment:        The GeneXpert MRSA Assay (FDA approved for NASAL specimens only), is one component of a comprehensive MRSA colonization surveillance program. It is  not intended to diagnose MRSA infection nor to guide or monitor treatment for MRSA infections. RESULT CALLED TO, READ BACK BY AND VERIFIED WITH: Michelene Gardener [id number] @ [place]: Basic Metabolic Panel:  Recent Labs Lab 04/11/16 0834 04/12/16 0215  NA 141 140  K 3.7 3.6  CL  --  112*  CO2 23 23  GLUCOSE 91 100*  BUN 33.1* 17  CREATININE 0.9 0.82  CALCIUM 8.3* 7.6*   Liver Function Tests:  Recent Labs Lab 04/11/16 0834  AST 20  ALT 18  ALKPHOS 40  BILITOT 0.78  PROT 5.7*  ALBUMIN 3.3*   No results for input(s): LIPASE, AMYLASE in the last 168 hours. No results for input(s): AMMONIA in the last 168 hours. CBC:  Recent Labs Lab 04/11/16 0834 04/12/16 0217 04/14/16 1521 04/15/16 0936  WBC 6.2 3.6* 5.1 5.4  NEUTROABS 3.9  --   --   --   HGB 8.4* 10.2* 9.8* 10.1*  HCT 24.8* 29.1* 29.4* 30.5*  MCV 87.9 88.2 90.2 90.0  PLT 119* 65* 67* 63*   Cardiac Enzymes: No results for input(s): CKTOTAL, CKMB, CKMBINDEX, TROPONINI in the last 168 hours. BNP: BNP (last 3 results) No results for input(s): BNP in the last 8760 hours.  ProBNP (last 3 results) No results for input(s): PROBNP  in the last 8760 hours.  CBG: No results for input(s): GLUCAP in the last 168 hours.

## 2016-04-15 NOTE — Progress Notes (Signed)
Patient ID: Nicole [name], female   DOB: 1956-05-23, 60 y.o.   MRN: [id number]    Referring Physician(s): Dr. Clarene Essex  Supervising Physician: Daryll Brod  Chief Complaint: Bleeding gastric varices  Subjective: Patient feels well today.  Tolerating a solid diet.  No pain  Allergies: Review of patient's allergies indicates no known allergies.  Medications: Prior to Admission medications   Medication Sig Start Date End Date Taking? Authorizing Provider  alendronate (FOSAMAX) 70 MG tablet TAKE 1 TABLET BY MOUTH ONCE A WEEK 08/22/15  Yes Chauncey Cruel, MD  Calcium Carbonate (CALTRATE 600 PO) Take by mouth daily.    Yes Historical Provider, MD  Cholecalciferol (VITAMIN D PO) Take 4,000 Units by mouth daily.    Yes Historical Provider, MD  letrozole (FEMARA) 2.5 MG tablet Take 1 tablet (2.5 mg total) by mouth daily. 11/01/15  Yes Chauncey Cruel, MD  venlafaxine XR (EFFEXOR-XR) 75 MG 24 hr capsule Take 1 capsule (75 mg total) by mouth daily. 11/01/15  Yes Chauncey Cruel, MD    Vital Signs: BP 112/63 mmHg  Pulse 89  Temp(Src) 98.7 F (37.1 C) (Oral)  Resp 16  Ht 5\' 4"  (1.626 m)  Wt 143 lb (64.864 kg)  BMI 24.53 kg/m2  SpO2 94%  Physical Exam: Abd: soft, Nt, Nd, +BS, right inguinal site is c/d/i with no evidence of hematoma or infection  Imaging: Ct Abdomen Pelvis W Wo Contrast  04/11/2016  CLINICAL DATA:  60 year old female with anemia and gastric varices on endoscopy. Evaluate for BRTO candidacy. EXAM: CT ABDOMEN AND PELVIS WITHOUT AND WITH CONTRAST TECHNIQUE: Multidetector CT imaging of the abdomen and pelvis was performed following the standard protocol before and following the bolus administration of intravenous contrast. CONTRAST:  171mL ISOVUE-300 IOPAMIDOL (ISOVUE-300) INJECTION 61% COMPARISON:  Prior CT scan of the abdomen and pelvis 12/02/2012 FINDINGS: Lower chest: No acute findings. The intracardiac blood pool is hypodense relative to the adjacent myocardium  consistent with anemia. Hepatobiliary: The liver does not appear overtly cirrhotic. There is no discrete solid hepatic lesion. Stable cyst in the posterior aspect of hepatic segment 6. The portal vein is normal in size. The hepatic veins are normal in size and patent. Please see vascular section below for further detail. Gallbladder is unremarkable. No intra or extrahepatic biliary ductal dilatation. Pancreas: No mass, inflammatory changes, or other significant abnormality. Spleen: Within normal limits in size and appearance. Adrenals/Urinary Tract: No masses identified. No evidence of hydronephrosis. Stomach/Bowel: No evidence of obstruction, inflammatory process, or abnormal fluid collections. Vascular/Lymphatic: Large gastrorenal shunt resulting in extensive gastric varices in the gastric fundus and cardia. This is almost certainly the source of the patient's bleeding. Additionally, there are small esophageal varices. The primary afferent inflow is via the posterior gastric vein. Additionally, there are contributions from small short gastric veins. No suspicious adenopathy. Reproductive: No mass or other significant abnormality. Other: None. Musculoskeletal: Chronic appearing L1 compression fracture with approximately 40% height loss anteriorly. No significant retropulsion or exaggerated kyphosis. Multilevel degenerative disc disease most noticeable at L4-L5 and L5-S1. No acute fracture or aggressive appearing lytic or blastic osseous lesion. IMPRESSION: 1. Large gastrorenal shunt resulting in extensive gastric varices. The primary afferent inflow is via the posterior gastric vein with lesser contributions from the short gastric veins. The anatomy is amenable to balloon occluded retrograde transvenous obliteration (BRTO). Please see coronal reformatted images for further anatomic detail. 2. Surprisingly, the liver does not appear overtly cirrhotic. However, the presence of a gastro renal  shunt is highly  suggestive of underlying cirrhosis. 3. The splenic vein remains patent. 4. Chronic appearing L1 compression fracture with approximately 40% height loss. 5. Multilevel degenerative disc disease. 6. Anemia. Signed, Criselda Peaches, MD [facility] Radiology Electronically Signed   By: Jacqulynn Cadet M.D.   On: 04/11/2016 17:30   Ir Angiogram Selective Each Additional Vessel  04/13/2016  CLINICAL DATA:  60 year old female with a history of bleeding gastric varices. She has no significant ascites and low risk esophageal varices. Balloon occluded retrograde trans venous obliteration is indicated as the solution for bleeding gastric varices. EXAM: IR EMBO ART VEN HEMORR LYMPH EXTRAV INC GUIDE ROADMAPPING; ADDITIONAL ARTERIOGRAPHY; IR ULTRASOUND GUIDANCE VASC ACCESS RIGHT; LEFT RENAL VENOGRAPHY; ARTERIOGRAPHY MEDICATIONS: No additional ANESTHESIA/SEDATION: General - as administered by the Anesthesia department CONTRAST:  1 ISOVUE-300 IOPAMIDOL (ISOVUE-300) INJECTION 61% FLUOROSCOPY TIME:  Fluoroscopy Time: 49 minutes 0 seconds COMPLICATIONS: None PROCEDURE: Informed written consent was obtained from the patient and the patient's family after a thorough discussion of the procedural risks, benefits and alternatives. Specific risks that were addressed regarding BRTO include, bleeding, infection, contrast reaction, kidney injury, need for further procedure or surgery including TIPS and/ or endoscopy, pulmonary embolism, stroke, worsening of ascites, worsening of esophageal varices, paradoxical liver failure, varix rupture, cardiopulmonary collapse, death. All questions were addressed. Maximal Sterile Barrier Technique was utilized including caps, mask, sterile gowns, sterile gloves, sterile drape, hand hygiene and skin antiseptic. A timeout was performed prior to the initiation of the procedure. Ultrasound survey of the right inguinal region was performed with images  stored and sent to PACs. A single wall puncture needle was used access the right common femoral artery under ultrasound guidance with saline syringe. With venous blood flow returned, a Bentson wire was observed to enter the common femoral vein and the IVC under fluoroscopy. The needle was removed, and a small incision was made, and 9 [place] dilator was passed over the Bentson wire. A 10 French TIPS sheath was then passed over the Bentson wire after manually creating a significant leftward current on the catheter. The inner dilator and wire were removed, and a 5 [place] cobra catheter was passed over the Bentson wire into the IVC. Cobra catheter was used to select the left renal vein orifice. Combination of the Bentson wire, Glidewire, Rosen wire were used to gain purchased into the renal vein, with renal vein limited venogram performed to identify the in flowing gastro renal shunt. With a safety curve on a stiff Amplatz wire position into superior pole renal vein, the TIPS sheath was advanced into the left renal vein over the Cobra catheter. Pullback reinforced straight sheath selection technique (PRESSS) was performed, and as the tip sheath was withdrawn, contrast was used to identified the inflow of the gastro renal shunt. With the coaxial Amplatz wire in position, a combination of a standard Kumpe the catheter and a 4 French angled glide catheter were used to cannulate the orifice of the gastro renal shunt, and 11 o'clock position from the in flowing shunt opposite the adrenal vein inflow. Once the Glidewire was advanced into the shunt, the glide catheter was advanced, and the Glidewire was exchanged for a stiff Amplatz wire with a safety curve on the tip. The renal vein buddy wire was withdrawn, and the TIPS sheath was advanced into the gastro renal shunt. The glide catheter was withdrawn and then a balloon occlusion catheter was advanced over the Amplatz wire into the gastro renal shunt. Amplatz  wire was then  withdrawn. Angiogram was then performed to confirmed location within the gastro renal shunt outflow. Balloon was inflated on the balloon occlusion catheter and was withdrawn to the first encountered web to achieve hemostasis. Standard angiogram was performed.  CO2 angiogram was also performed. A penumbra Lantern microcatheter and a fathom wire were navigated into the shunt. Once we confirmed location within the outflow with standard angiogram, treatment was initiated. A foamed solution of detergent/sclerosant was mixed in a standard Tessari method, with 1:2:3 ratio of ethiodized oil: sodium tetradecyl sulfate: Room air. The sclerosed sent was infused under fluoroscopic guidance, with observation of reflux into the afferent veins. Once reflux was identified into the posterior gastric vein, sclerosing was completed. The micro catheter was withdrawn to the tip of the balloon occlusion catheter, and coil mass was deposited with deployment of multiple Ruby coils. 4 standard Ruby coils and 1 soft ruby coil deployed. After 20 minutes, the balloon was deflated under fluoroscopy to observe the foam mass. Once we were confident there was no migration, balloon was withdrawn and the sheath was withdrawn. Hemostasis was achieved with manual compression. Patient remained hemodynamically stable throughout. No complications were encountered and no significant blood loss was encountered. IMPRESSION: Status post coil assisted balloon occluded retrograde trans venous obliteration (BRTO) for treatment of bleeding gastric varices. Signed, Dulcy Fanny. Earleen Newport, DO [facility] Radiology Electronically Signed   By: Corrie Mckusick D.O.   On: 04/13/2016 15:06   Ir Venogram Renal Uni Left  04/13/2016  CLINICAL DATA:  60 year old female with a history of bleeding gastric varices. She has no significant ascites and low risk esophageal varices. Balloon occluded retrograde trans venous obliteration is  indicated as the solution for bleeding gastric varices. EXAM: IR EMBO ART VEN HEMORR LYMPH EXTRAV INC GUIDE ROADMAPPING; ADDITIONAL ARTERIOGRAPHY; IR ULTRASOUND GUIDANCE VASC ACCESS RIGHT; LEFT RENAL VENOGRAPHY; ARTERIOGRAPHY MEDICATIONS: No additional ANESTHESIA/SEDATION: General - as administered by the Anesthesia department CONTRAST:  1 ISOVUE-300 IOPAMIDOL (ISOVUE-300) INJECTION 61% FLUOROSCOPY TIME:  Fluoroscopy Time: 49 minutes 0 seconds COMPLICATIONS: None PROCEDURE: Informed written consent was obtained from the patient and the patient's family after a thorough discussion of the procedural risks, benefits and alternatives. Specific risks that were addressed regarding BRTO include, bleeding, infection, contrast reaction, kidney injury, need for further procedure or surgery including TIPS and/ or endoscopy, pulmonary embolism, stroke, worsening of ascites, worsening of esophageal varices, paradoxical liver failure, varix rupture, cardiopulmonary collapse, death. All questions were addressed. Maximal Sterile Barrier Technique was utilized including caps, mask, sterile gowns, sterile gloves, sterile drape, hand hygiene and skin antiseptic. A timeout was performed prior to the initiation of the procedure. Ultrasound survey of the right inguinal region was performed with images stored and sent to PACs. A single wall puncture needle was used access the right common femoral artery under ultrasound guidance with saline syringe. With venous blood flow returned, a Bentson wire was observed to enter the common femoral vein and the IVC under fluoroscopy. The needle was removed, and a small incision was made, and 9 [place] dilator was passed over the Bentson wire. A 10 French TIPS sheath was then passed over the Bentson wire after manually creating a significant leftward current on the catheter. The inner dilator and wire were removed, and a 5 [place] cobra catheter was passed over the Bentson wire into the IVC. Cobra  catheter was used to select the left renal vein orifice. Combination of the Bentson wire, Glidewire, Rosen wire were used  to gain purchased into the renal vein, with renal vein limited venogram performed to identify the in flowing gastro renal shunt. With a safety curve on a stiff Amplatz wire position into superior pole renal vein, the TIPS sheath was advanced into the left renal vein over the Cobra catheter. Pullback reinforced straight sheath selection technique (PRESSS) was performed, and as the tip sheath was withdrawn, contrast was used to identified the inflow of the gastro renal shunt. With the coaxial Amplatz wire in position, a combination of a standard Kumpe the catheter and a 4 French angled glide catheter were used to cannulate the orifice of the gastro renal shunt, and 11 o'clock position from the in flowing shunt opposite the adrenal vein inflow. Once the Glidewire was advanced into the shunt, the glide catheter was advanced, and the Glidewire was exchanged for a stiff Amplatz wire with a safety curve on the tip. The renal vein buddy wire was withdrawn, and the TIPS sheath was advanced into the gastro renal shunt. The glide catheter was withdrawn and then a balloon occlusion catheter was advanced over the Amplatz wire into the gastro renal shunt. Amplatz wire was then withdrawn. Angiogram was then performed to confirmed location within the gastro renal shunt outflow. Balloon was inflated on the balloon occlusion catheter and was withdrawn to the first encountered web to achieve hemostasis. Standard angiogram was performed.  CO2 angiogram was also performed. A penumbra Lantern microcatheter and a fathom wire were navigated into the shunt. Once we confirmed location within the outflow with standard angiogram, treatment was initiated. A foamed solution of detergent/sclerosant was mixed in a standard Tessari method, with 1:2:3 ratio of ethiodized oil: sodium tetradecyl sulfate: Room air. The sclerosed  sent was infused under fluoroscopic guidance, with observation of reflux into the afferent veins. Once reflux was identified into the posterior gastric vein, sclerosing was completed. The micro catheter was withdrawn to the tip of the balloon occlusion catheter, and coil mass was deposited with deployment of multiple Ruby coils. 4 standard Ruby coils and 1 soft ruby coil deployed. After 20 minutes, the balloon was deflated under fluoroscopy to observe the foam mass. Once we were confident there was no migration, balloon was withdrawn and the sheath was withdrawn. Hemostasis was achieved with manual compression. Patient remained hemodynamically stable throughout. No complications were encountered and no significant blood loss was encountered. IMPRESSION: Status post coil assisted balloon occluded retrograde trans venous obliteration (BRTO) for treatment of bleeding gastric varices. Signed, Dulcy Fanny. Earleen Newport, DO [facility] Radiology Electronically Signed   By: Corrie Mckusick D.O.   On: 04/13/2016 15:06   Ir Angiogram Follow Up Study  04/13/2016  CLINICAL DATA:  60 year old female with a history of bleeding gastric varices. She has no significant ascites and low risk esophageal varices. Balloon occluded retrograde trans venous obliteration is indicated as the solution for bleeding gastric varices. EXAM: IR EMBO ART VEN HEMORR LYMPH EXTRAV INC GUIDE ROADMAPPING; ADDITIONAL ARTERIOGRAPHY; IR ULTRASOUND GUIDANCE VASC ACCESS RIGHT; LEFT RENAL VENOGRAPHY; ARTERIOGRAPHY MEDICATIONS: No additional ANESTHESIA/SEDATION: General - as administered by the Anesthesia department CONTRAST:  1 ISOVUE-300 IOPAMIDOL (ISOVUE-300) INJECTION 61% FLUOROSCOPY TIME:  Fluoroscopy Time: 49 minutes 0 seconds COMPLICATIONS: None PROCEDURE: Informed written consent was obtained from the patient and the patient's family after a thorough discussion of the procedural risks, benefits and alternatives.  Specific risks that were addressed regarding BRTO include, bleeding, infection, contrast reaction, kidney injury, need for further procedure or surgery including TIPS and/ or  endoscopy, pulmonary embolism, stroke, worsening of ascites, worsening of esophageal varices, paradoxical liver failure, varix rupture, cardiopulmonary collapse, death. All questions were addressed. Maximal Sterile Barrier Technique was utilized including caps, mask, sterile gowns, sterile gloves, sterile drape, hand hygiene and skin antiseptic. A timeout was performed prior to the initiation of the procedure. Ultrasound survey of the right inguinal region was performed with images stored and sent to PACs. A single wall puncture needle was used access the right common femoral artery under ultrasound guidance with saline syringe. With venous blood flow returned, a Bentson wire was observed to enter the common femoral vein and the IVC under fluoroscopy. The needle was removed, and a small incision was made, and 9 [place] dilator was passed over the Bentson wire. A 10 French TIPS sheath was then passed over the Bentson wire after manually creating a significant leftward current on the catheter. The inner dilator and wire were removed, and a 5 [place] cobra catheter was passed over the Bentson wire into the IVC. Cobra catheter was used to select the left renal vein orifice. Combination of the Bentson wire, Glidewire, Rosen wire were used to gain purchased into the renal vein, with renal vein limited venogram performed to identify the in flowing gastro renal shunt. With a safety curve on a stiff Amplatz wire position into superior pole renal vein, the TIPS sheath was advanced into the left renal vein over the Cobra catheter. Pullback reinforced straight sheath selection technique (PRESSS) was performed, and as the tip sheath was withdrawn, contrast was used to identified the inflow of the gastro renal shunt. With the coaxial Amplatz wire in position,  a combination of a standard Kumpe the catheter and a 4 French angled glide catheter were used to cannulate the orifice of the gastro renal shunt, and 11 o'clock position from the in flowing shunt opposite the adrenal vein inflow. Once the Glidewire was advanced into the shunt, the glide catheter was advanced, and the Glidewire was exchanged for a stiff Amplatz wire with a safety curve on the tip. The renal vein buddy wire was withdrawn, and the TIPS sheath was advanced into the gastro renal shunt. The glide catheter was withdrawn and then a balloon occlusion catheter was advanced over the Amplatz wire into the gastro renal shunt. Amplatz wire was then withdrawn. Angiogram was then performed to confirmed location within the gastro renal shunt outflow. Balloon was inflated on the balloon occlusion catheter and was withdrawn to the first encountered web to achieve hemostasis. Standard angiogram was performed.  CO2 angiogram was also performed. A penumbra Lantern microcatheter and a fathom wire were navigated into the shunt. Once we confirmed location within the outflow with standard angiogram, treatment was initiated. A foamed solution of detergent/sclerosant was mixed in a standard Tessari method, with 1:2:3 ratio of ethiodized oil: sodium tetradecyl sulfate: Room air. The sclerosed sent was infused under fluoroscopic guidance, with observation of reflux into the afferent veins. Once reflux was identified into the posterior gastric vein, sclerosing was completed. The micro catheter was withdrawn to the tip of the balloon occlusion catheter, and coil mass was deposited with deployment of multiple Ruby coils. 4 standard Ruby coils and 1 soft ruby coil deployed. After 20 minutes, the balloon was deflated under fluoroscopy to observe the foam mass. Once we were confident there was no migration, balloon was withdrawn and the sheath was withdrawn. Hemostasis was achieved with manual compression. Patient remained  hemodynamically stable throughout. No complications were encountered and no significant blood loss  was encountered. IMPRESSION: Status post coil assisted balloon occluded retrograde trans venous obliteration (BRTO) for treatment of bleeding gastric varices. Signed, Dulcy Fanny. Earleen Newport, DO [facility] Radiology Electronically Signed   By: Corrie Mckusick D.O.   On: 04/13/2016 15:06   Ir US Guide Vasc Access Right  04/13/2016  CLINICAL DATA:  60 year old female with a history of bleeding gastric varices. She has no significant ascites and low risk esophageal varices. Balloon occluded retrograde trans venous obliteration is indicated as the solution for bleeding gastric varices. EXAM: IR EMBO ART VEN HEMORR LYMPH EXTRAV INC GUIDE ROADMAPPING; ADDITIONAL ARTERIOGRAPHY; IR ULTRASOUND GUIDANCE VASC ACCESS RIGHT; LEFT RENAL VENOGRAPHY; ARTERIOGRAPHY MEDICATIONS: No additional ANESTHESIA/SEDATION: General - as administered by the Anesthesia department CONTRAST:  1 ISOVUE-300 IOPAMIDOL (ISOVUE-300) INJECTION 61% FLUOROSCOPY TIME:  Fluoroscopy Time: 49 minutes 0 seconds COMPLICATIONS: None PROCEDURE: Informed written consent was obtained from the patient and the patient's family after a thorough discussion of the procedural risks, benefits and alternatives. Specific risks that were addressed regarding BRTO include, bleeding, infection, contrast reaction, kidney injury, need for further procedure or surgery including TIPS and/ or endoscopy, pulmonary embolism, stroke, worsening of ascites, worsening of esophageal varices, paradoxical liver failure, varix rupture, cardiopulmonary collapse, death. All questions were addressed. Maximal Sterile Barrier Technique was utilized including caps, mask, sterile gowns, sterile gloves, sterile drape, hand hygiene and skin antiseptic. A timeout was performed prior to the initiation of the procedure. Ultrasound survey of the right inguinal region  was performed with images stored and sent to PACs. A single wall puncture needle was used access the right common femoral artery under ultrasound guidance with saline syringe. With venous blood flow returned, a Bentson wire was observed to enter the common femoral vein and the IVC under fluoroscopy. The needle was removed, and a small incision was made, and 9 [place] dilator was passed over the Bentson wire. A 10 French TIPS sheath was then passed over the Bentson wire after manually creating a significant leftward current on the catheter. The inner dilator and wire were removed, and a 5 [place] cobra catheter was passed over the Bentson wire into the IVC. Cobra catheter was used to select the left renal vein orifice. Combination of the Bentson wire, Glidewire, Rosen wire were used to gain purchased into the renal vein, with renal vein limited venogram performed to identify the in flowing gastro renal shunt. With a safety curve on a stiff Amplatz wire position into superior pole renal vein, the TIPS sheath was advanced into the left renal vein over the Cobra catheter. Pullback reinforced straight sheath selection technique (PRESSS) was performed, and as the tip sheath was withdrawn, contrast was used to identified the inflow of the gastro renal shunt. With the coaxial Amplatz wire in position, a combination of a standard Kumpe the catheter and a 4 French angled glide catheter were used to cannulate the orifice of the gastro renal shunt, and 11 o'clock position from the in flowing shunt opposite the adrenal vein inflow. Once the Glidewire was advanced into the shunt, the glide catheter was advanced, and the Glidewire was exchanged for a stiff Amplatz wire with a safety curve on the tip. The renal vein buddy wire was withdrawn, and the TIPS sheath was advanced into the gastro renal shunt. The glide catheter was withdrawn and then a balloon occlusion catheter was advanced over the Amplatz wire into the gastro renal  shunt. Amplatz wire was then withdrawn. Angiogram was then performed to confirmed location  within the gastro renal shunt outflow. Balloon was inflated on the balloon occlusion catheter and was withdrawn to the first encountered web to achieve hemostasis. Standard angiogram was performed.  CO2 angiogram was also performed. A penumbra Lantern microcatheter and a fathom wire were navigated into the shunt. Once we confirmed location within the outflow with standard angiogram, treatment was initiated. A foamed solution of detergent/sclerosant was mixed in a standard Tessari method, with 1:2:3 ratio of ethiodized oil: sodium tetradecyl sulfate: Room air. The sclerosed sent was infused under fluoroscopic guidance, with observation of reflux into the afferent veins. Once reflux was identified into the posterior gastric vein, sclerosing was completed. The micro catheter was withdrawn to the tip of the balloon occlusion catheter, and coil mass was deposited with deployment of multiple Ruby coils. 4 standard Ruby coils and 1 soft ruby coil deployed. After 20 minutes, the balloon was deflated under fluoroscopy to observe the foam mass. Once we were confident there was no migration, balloon was withdrawn and the sheath was withdrawn. Hemostasis was achieved with manual compression. Patient remained hemodynamically stable throughout. No complications were encountered and no significant blood loss was encountered. IMPRESSION: Status post coil assisted balloon occluded retrograde trans venous obliteration (BRTO) for treatment of bleeding gastric varices. Signed, Dulcy Fanny. Earleen Newport, DO [facility] Radiology Electronically Signed   By: Corrie Mckusick D.O.   On: 04/13/2016 15:06   Ct Abd Wo & W Cm  04/14/2016  CLINICAL DATA:  60 year old female with a history of hemorrhaging gastric varices an low risk esophageal varices. 24 hours status post coil assisted balloon retrograde trans venous  obliteration. EXAM: CT ABDOMEN WITH CONTRAST TECHNIQUE: Multidetector CT imaging of the abdomen was performed using the standard protocol following bolus administration of intravenous contrast. CONTRAST:  1 ISOVUE-300 IOPAMIDOL (ISOVUE-300) INJECTION 61% COMPARISON:  CT 04/11/2016 FINDINGS: Lower chest: Surgical changes of right mastectomy. Heart size within normal limits.  No pericardial fluid/thickening. No lower mediastinal adenopathy. Unremarkable appearance of the distal esophagus. No hiatal hernia. Trace bilateral pleural effusions with associated minimal atelectasis. No confluent airspace disease. Abdomen: Similar appearance of the liver 2 prior, with benign cystic lesion in segment 6. As was noted on comparison CT, there is no significant nodularity or enlargement of segment 1, as usually seen with imaging diagnosis of cirrhosis. Cranial caudal span of the spleen measures approximately 14 cm. Unremarkable appearance of the adrenal glands. Contracted gallbladder. Unremarkable appearance of the pancreas. Unremarkable bilateral kidneys. Unremarkable visualized course of the bilateral ureters. Small volume of ascitic fluid. Unremarkable appearance of stomach, and visualized small bowel. Diverticular disease of the colon again visualized. No abnormally distended small bowel or colon. Small bowel, stomach, and colonic wall unremarkable. Post interventional changes of gastric variceal treatment with radio-opaque sclerosant filling the previous GV network. No residual gastric varices visualized on the study. Similar appearance of esophageal varices in the lower esophagus. The most proximal aspect of the gastro renal shunt has thrombosed, with trace thrombus extending along the roof of the left renal vein to the ostium with the IVC. Left renal vein remains widely patent. There is trace sclerosing agent within distal hepatic veins of the left and right liver. Widely patent portal system, including portal vein, splenic  vein, superior mesenteric vein, inferior mesenteric vein, and contributing inflow. Posterior gastric vein and short gastric veins have been treated, with no further contribution to the varices network. Unremarkable appearance of the abdominal aorta with minimal atherosclerotic changes. Patency of celiac artery, superior  mesenteric artery, and inferior mesenteric artery maintained. Bilateral renal arteries maintained. Similar configuration of compression fracture of L1. Degenerative changes of the visualized spine, unchanged from prior. IMPRESSION: Status post BRTO, with complete obliteration of Type B gastric variceal network and no further contributing inflow. Trace ascites, potentially reactive status post treatment. Thrombosis of the spleno-gastro-renal shunt, with tiny amount of thrombus extending from the shunt inflow along roof of the left renal vein, with wide patency of left renal vein outflow maintained. IVC widely patent. Trace radiopaque sclerosant within distal portal vein branches of the bilateral liver lobes. Portal system patency is maintained. Splenomegaly, compatible with portal venous hypertension. Signed, Dulcy Fanny. Earleen Newport, DO [facility] Radiology Electronically Signed   By: Corrie Mckusick D.O.   On: 04/14/2016 14:40   [place] Guide Roadmapping  04/13/2016  CLINICAL DATA:  60 year old female with a history of bleeding gastric varices. She has no significant ascites and low risk esophageal varices. Balloon occluded retrograde trans venous obliteration is indicated as the solution for bleeding gastric varices. EXAM: IR EMBO ART VEN HEMORR LYMPH EXTRAV INC GUIDE ROADMAPPING; ADDITIONAL ARTERIOGRAPHY; IR ULTRASOUND GUIDANCE VASC ACCESS RIGHT; LEFT RENAL VENOGRAPHY; ARTERIOGRAPHY MEDICATIONS: No additional ANESTHESIA/SEDATION: General - as administered by the Anesthesia department CONTRAST:  1 ISOVUE-300 IOPAMIDOL  (ISOVUE-300) INJECTION 61% FLUOROSCOPY TIME:  Fluoroscopy Time: 49 minutes 0 seconds COMPLICATIONS: None PROCEDURE: Informed written consent was obtained from the patient and the patient's family after a thorough discussion of the procedural risks, benefits and alternatives. Specific risks that were addressed regarding BRTO include, bleeding, infection, contrast reaction, kidney injury, need for further procedure or surgery including TIPS and/ or endoscopy, pulmonary embolism, stroke, worsening of ascites, worsening of esophageal varices, paradoxical liver failure, varix rupture, cardiopulmonary collapse, death. All questions were addressed. Maximal Sterile Barrier Technique was utilized including caps, mask, sterile gowns, sterile gloves, sterile drape, hand hygiene and skin antiseptic. A timeout was performed prior to the initiation of the procedure. Ultrasound survey of the right inguinal region was performed with images stored and sent to PACs. A single wall puncture needle was used access the right common femoral artery under ultrasound guidance with saline syringe. With venous blood flow returned, a Bentson wire was observed to enter the common femoral vein and the IVC under fluoroscopy. The needle was removed, and a small incision was made, and 9 [place] dilator was passed over the Bentson wire. A 10 French TIPS sheath was then passed over the Bentson wire after manually creating a significant leftward current on the catheter. The inner dilator and wire were removed, and a 5 [place] cobra catheter was passed over the Bentson wire into the IVC. Cobra catheter was used to select the left renal vein orifice. Combination of the Bentson wire, Glidewire, Rosen wire were used to gain purchased into the renal vein, with renal vein limited venogram performed to identify the in flowing gastro renal shunt. With a safety curve on a stiff Amplatz wire position into superior pole renal vein, the TIPS sheath was advanced into  the left renal vein over the Cobra catheter. Pullback reinforced straight sheath selection technique (PRESSS) was performed, and as the tip sheath was withdrawn, contrast was used to identified the inflow of the gastro renal shunt. With the coaxial Amplatz wire in position, a combination of a standard [place] the catheter and a 4 French angled glide catheter were used to cannulate the orifice of the gastro renal shunt,  and 11 o'clock position from the in flowing shunt opposite the adrenal vein inflow. Once the Glidewire was advanced into the shunt, the glide catheter was advanced, and the Glidewire was exchanged for a stiff Amplatz wire with a safety curve on the tip. The renal vein buddy wire was withdrawn, and the TIPS sheath was advanced into the gastro renal shunt. The glide catheter was withdrawn and then a balloon occlusion catheter was advanced over the Amplatz wire into the gastro renal shunt. Amplatz wire was then withdrawn. Angiogram was then performed to confirmed location within the gastro renal shunt outflow. Balloon was inflated on the balloon occlusion catheter and was withdrawn to the first encountered web to achieve hemostasis. Standard angiogram was performed.  CO2 angiogram was also performed. A penumbra Lantern microcatheter and a fathom wire were navigated into the shunt. Once we confirmed location within the outflow with standard angiogram, treatment was initiated. A foamed solution of detergent/sclerosant was mixed in a standard Tessari method, with 1:2:3 ratio of ethiodized oil: sodium tetradecyl sulfate: Room air. The sclerosed sent was infused under fluoroscopic guidance, with observation of reflux into the afferent veins. Once reflux was identified into the posterior gastric vein, sclerosing was completed. The micro catheter was withdrawn to the tip of the balloon occlusion catheter, and coil mass was deposited with deployment of multiple Ruby coils. 4 standard Ruby coils and 1 soft ruby  coil deployed. After 20 minutes, the balloon was deflated under fluoroscopy to observe the foam mass. Once we were confident there was no migration, balloon was withdrawn and the sheath was withdrawn. Hemostasis was achieved with manual compression. Patient remained hemodynamically stable throughout. No complications were encountered and no significant blood loss was encountered. IMPRESSION: Status post coil assisted balloon occluded retrograde trans venous obliteration (BRTO) for treatment of bleeding gastric varices. Signed, Dulcy Fanny. Earleen Newport, DO [facility] Radiology Electronically Signed   By: Corrie Mckusick D.O.   On: 04/13/2016 15:06    Labs:  CBC:  Recent Labs  10/25/15 0922 04/11/16 0834 04/12/16 0217 04/14/16 1521  WBC 3.8* 6.2 3.6* 5.1  HGB 14.6 8.4* 10.2* 9.8*  HCT 41.9 24.8* 29.1* 29.4*  PLT 83* 119* 65* 67*    COAGS:  Recent Labs  04/12/16 0959  INR 1.06    BMP:  Recent Labs  09/29/15 1556 10/25/15 0922 04/11/16 0834 04/12/16 0215  NA 142 143 141 140  K 3.8 3.9 3.7 3.6  CL  --   --   --  112*  CO2 23 21* 23 23  GLUCOSE 88 94 91 100*  BUN 13.0 13.4 33.1* 17  CALCIUM 9.1 9.2 8.3* 7.6*  CREATININE 1.0 1.0 0.9 0.82  GFRNONAA  --   --   --  >60  GFRAA  --   --   --  >60    LIVER FUNCTION TESTS:  Recent Labs  09/29/15 1556 10/25/15 0922 04/11/16 0834  BILITOT 0.85 0.87 0.78  AST 21 20 20   ALT 22 22 18   ALKPHOS 72 67 40  PROT 6.8 6.7 5.7*  ALBUMIN 4.0 3.9 3.3*    Assessment and Plan: 1. S/p BRTO for bleeding gastric varices by Dr. Earleen Newport 04-13-16 -patient doing really well -plan for DC home today -follow up in 4 weeks with Earleen Newport with labs and repeat CT with BRTO protocol  Electronically Signed: Katarina Riebe E 04/15/2016, 9:10 AM   I spent a total of 15 Minutes at the the patient's bedside AND on the  patient's hospital floor or unit, greater than 50% of which was counseling/coordinating care  for bleeding gastric varices

## 2016-04-15 NOTE — Progress Notes (Signed)
Discussed the situaiton w [name]-- she did well with the interim procedure and is hoping to go home today.  i remain puzzled as to the cause of her varices and have set her up for a PET scan before her visit with [place] scheduled for 5/11  Greatly appreciate everyone's great care of this patient!

## 2016-04-16 ENCOUNTER — Encounter (HOSPITAL_COMMUNITY): Payer: Self-pay | Admitting: Interventional Radiology

## 2016-04-24 ENCOUNTER — Telehealth: Payer: Self-pay | Admitting: *Deleted

## 2016-04-24 NOTE — Telephone Encounter (Signed)
Nicole [name] husband called and states she is very weak and her urine is a little pink. No fever, stool is normal. He is concerned. He is also calling Dr Watt Climes. She had a BRTO treatment on 04-13-16 by Dr Earleen Newport. I sent a msg to Dr Earleen Newport and he states she may be dehydrated and needs labs drawn. Possibly a UTI. He recommends an ED evaluation at [facility] or [place]. I called the patient back and she states she wants to give it one more day before they go to the ED./vm

## 2016-04-26 DIAGNOSIS — R319 Hematuria, unspecified: Secondary | ICD-10-CM | POA: Diagnosis not present

## 2016-04-26 DIAGNOSIS — R5383 Other fatigue: Secondary | ICD-10-CM | POA: Diagnosis not present

## 2016-04-29 ENCOUNTER — Telehealth: Payer: Self-pay | Admitting: *Deleted

## 2016-04-29 ENCOUNTER — Other Ambulatory Visit (HOSPITAL_BASED_OUTPATIENT_CLINIC_OR_DEPARTMENT_OTHER): Payer: BLUE CROSS/BLUE SHIELD

## 2016-04-29 ENCOUNTER — Ambulatory Visit (HOSPITAL_BASED_OUTPATIENT_CLINIC_OR_DEPARTMENT_OTHER): Payer: BLUE CROSS/BLUE SHIELD | Admitting: Nurse Practitioner

## 2016-04-29 ENCOUNTER — Other Ambulatory Visit: Payer: Self-pay | Admitting: *Deleted

## 2016-04-29 VITALS — BP 131/81 | HR 108 | Temp 98.0°F | Resp 19 | Ht 64.0 in | Wt 136.9 lb

## 2016-04-29 DIAGNOSIS — C50911 Malignant neoplasm of unspecified site of right female breast: Secondary | ICD-10-CM

## 2016-04-29 DIAGNOSIS — C7800 Secondary malignant neoplasm of unspecified lung: Principal | ICD-10-CM

## 2016-04-29 DIAGNOSIS — R319 Hematuria, unspecified: Secondary | ICD-10-CM

## 2016-04-29 DIAGNOSIS — C50919 Malignant neoplasm of unspecified site of unspecified female breast: Secondary | ICD-10-CM

## 2016-04-29 DIAGNOSIS — D62 Acute posthemorrhagic anemia: Secondary | ICD-10-CM

## 2016-04-29 DIAGNOSIS — N390 Urinary tract infection, site not specified: Secondary | ICD-10-CM

## 2016-04-29 DIAGNOSIS — C50411 Malignant neoplasm of upper-outer quadrant of right female breast: Secondary | ICD-10-CM

## 2016-04-29 DIAGNOSIS — F329 Major depressive disorder, single episode, unspecified: Secondary | ICD-10-CM | POA: Diagnosis not present

## 2016-04-29 DIAGNOSIS — F32A Depression, unspecified: Secondary | ICD-10-CM

## 2016-04-29 LAB — URINALYSIS, MICROSCOPIC - CHCC
Bilirubin (Urine): NEGATIVE
Glucose: NEGATIVE mg/dL
Ketones: NEGATIVE mg/dL
NITRITE: NEGATIVE
PH: 7.5 (ref 4.6–8.0)
Protein: <30 mg/dL
Specific Gravity, Urine: 1.01 (ref 1.003–1.035)
UROBILINOGEN UR: 0.2 mg/dL (ref 0.2–1)

## 2016-04-29 LAB — CBC WITH DIFFERENTIAL/PLATELET
BASO%: 1 % (ref 0.0–2.0)
Basophils Absolute: 0.1 10*3/uL (ref 0.0–0.1)
EOS ABS: 0 10*3/uL (ref 0.0–0.5)
EOS%: 0.6 % (ref 0.0–7.0)
HCT: 31.7 % — ABNORMAL LOW (ref 34.8–46.6)
HGB: 10.4 g/dL — ABNORMAL LOW (ref 11.6–15.9)
LYMPH%: 13.1 % — AB (ref 14.0–49.7)
MCH: 29.3 pg (ref 25.1–34.0)
MCHC: 32.7 g/dL (ref 31.5–36.0)
MCV: 89.5 fL (ref 79.5–101.0)
MONO#: 0.4 10*3/uL (ref 0.1–0.9)
MONO%: 5.8 % (ref 0.0–14.0)
NEUT%: 79.5 % — AB (ref 38.4–76.8)
NEUTROS ABS: 4.9 10*3/uL (ref 1.5–6.5)
PLATELETS: 156 10*3/uL (ref 145–400)
RBC: 3.54 10*6/uL — AB (ref 3.70–5.45)
RDW: 20.6 % — AB (ref 11.2–14.5)
WBC: 6.2 10*3/uL (ref 3.9–10.3)
lymph#: 0.8 10*3/uL — ABNORMAL LOW (ref 0.9–3.3)

## 2016-04-29 LAB — COMPREHENSIVE METABOLIC PANEL
ALT: 19 U/L (ref 0–55)
ANION GAP: 11 meq/L (ref 3–11)
AST: 18 U/L (ref 5–34)
Albumin: 3.8 g/dL (ref 3.5–5.0)
Alkaline Phosphatase: 76 U/L (ref 40–150)
BILIRUBIN TOTAL: 0.71 mg/dL (ref 0.20–1.20)
BUN: 17.6 mg/dL (ref 7.0–26.0)
CO2: 21 meq/L — AB (ref 22–29)
Calcium: 9.6 mg/dL (ref 8.4–10.4)
Chloride: 108 mEq/L (ref 98–109)
Creatinine: 1.1 mg/dL (ref 0.6–1.1)
EGFR: 52 mL/min/{1.73_m2} — AB (ref >90)
GLUCOSE: 93 mg/dL (ref 70–140)
POTASSIUM: 4.1 meq/L (ref 3.5–5.1)
SODIUM: 140 meq/L (ref 136–145)
TOTAL PROTEIN: 7.5 g/dL (ref 6.4–8.3)

## 2016-04-29 MED ORDER — CIPROFLOXACIN HCL 500 MG PO TABS: 500.0000 mg | ORAL_TABLET | Freq: Two times a day (BID) | ORAL | Status: DC

## 2016-04-29 MED ORDER — VENLAFAXINE HCL ER 150 MG PO CP24: 150.0000 mg | ORAL_CAPSULE | Freq: Every day | ORAL | Status: DC

## 2016-04-29 NOTE — Telephone Encounter (Signed)
Call received from pt's husband- stating " Nicole [name] had blood in her urine last week and felt very weak"  They contacted her primary MD at [facility] and went to the Urgent Care office - per husband " they checked her CBC and urine- she had blood in her urine but they didn't see any bacteria- her heme was 8.5 and hct 26.11"  Mr Pauley left a message with this office on Friday and has been awaiting a return call per instructions by Urgent Care to follow up with this office.  Per discussion and possible need for blood transfusion- appointment made for symptom management to assess appropriately.  Urgent POF sent and scheduler contacted.  Orders for CBC, U/A, CMET, and type and hold placed .

## 2016-04-30 ENCOUNTER — Encounter: Payer: Self-pay | Admitting: Nurse Practitioner

## 2016-04-30 DIAGNOSIS — F329 Major depressive disorder, single episode, unspecified: Secondary | ICD-10-CM | POA: Insufficient documentation

## 2016-04-30 DIAGNOSIS — N390 Urinary tract infection, site not specified: Secondary | ICD-10-CM | POA: Insufficient documentation

## 2016-04-30 DIAGNOSIS — F32A Depression, unspecified: Secondary | ICD-10-CM | POA: Insufficient documentation

## 2016-04-30 LAB — URINE CULTURE

## 2016-04-30 NOTE — Assessment & Plan Note (Signed)
Patient continues to take Femara oral therapy as directed.  She reports questionable mild UTI symptoms; and also feels slightly more depressed.  Patient denies any other new symptoms.  She denies any recent fevers or chills.  Patient is scheduled for labs and a restaging PET scan on 05/02/2016.  She is scheduled for a follow-up visit on 05/09/2016.

## 2016-04-30 NOTE — Progress Notes (Signed)
SYMPTOM MANAGEMENT CLINIC    Chief Complaint: UTI and depression  HPI:  Nicole [name] 60 y.o. female diagnosed with breast cancer with lung metastasis.  Currently undergoing Femara oral therapy.  Patient presents today with complaint of questionable UTI symptoms and increased depression.    No history exists.    Review of Systems  Constitutional: Positive for malaise/fatigue.  Psychiatric/Behavioral: Positive for depression.  All other systems reviewed and are negative.   Past Medical History  Diagnosis Date  . IBD (inflammatory bowel disease)     resolved  . Heart murmur     mild, no cardiologist  . Blood transfusion 2012  . Radiation 07/21/12-09/07/12    5940 cGy  . Breast CA ([place])     breast ca dx 06/2010, stage 4, right     Past Surgical History  Procedure Laterality Date  . Portacath placement  2011    left side  . Dilation and curettage of uterus  2004?  [name] Mastectomy  06/02/12    right breast  . Port-a-cath removal    . Esophagogastroduodenoscopy (egd) with propofol N/A 04/11/2016    Procedure: ESOPHAGOGASTRODUODENOSCOPY (EGD) WITH PROPOFOL;  Surgeon: Clarene Essex, MD;  Location: [facility];  Service: Endoscopy;  Laterality: N/A;  . Radiology with anesthesia N/A 04/13/2016    Procedure: RADIOLOGY WITH ANESTHESIA;  Surgeon: Corrie Mckusick, DO;  Location: [place];  Service: Anesthesiology;  Laterality: N/A;    has DVT (deep venous thrombosis) ([place]); Lymphedema of arm; Breast cancer of upper-outer quadrant of right female breast ([place]); Breast cancer metastasized to lung [facility]); Acute upper GI bleed; Esophageal varices ([place]); Depression; and UTI (urinary tract infection) on her problem list.    has No Known Allergies.    Medication List       This list is accurate as of: 04/29/16 11:59 PM.  Always use your most recent med list.               alendronate 70 MG tablet  Commonly known as:  FOSAMAX  TAKE 1 TABLET BY MOUTH ONCE A WEEK     CALTRATE 600 PO  Take by  mouth daily.     ciprofloxacin 500 MG tablet  Commonly known as:  CIPRO  Take 1 tablet (500 mg total) by mouth 2 (two) times daily.     letrozole 2.5 MG tablet  Commonly known as:  FEMARA  Take 1 tablet (2.5 mg total) by mouth daily.     venlafaxine XR 150 MG 24 hr capsule  Commonly known as:  EFFEXOR XR  Take 1 capsule (150 mg total) by mouth daily with breakfast.     VITAMIN D PO  Take 4,000 Units by mouth daily.         PHYSICAL EXAMINATION  Oncology Vitals 04/29/2016 04/15/2016  Height 163 cm -  Weight 62.097 kg -  Weight (lbs) 136 lbs 14 oz -  BMI (kg/m2) 23.5 kg/m2 -  Temp 98 98.7  Pulse 108 89  Resp 19 16  SpO2 100 94  BSA (m2) 1.67 m2 -   BP Readings from Last 2 Encounters:  04/29/16 131/81  04/15/16 112/63    Physical Exam  Constitutional: She is oriented to person, place, and time and well-developed, well-nourished, and in no distress.  HENT:  Head: Normocephalic and atraumatic.  Mouth/Throat: Oropharynx is clear and moist.  Eyes: Conjunctivae and EOM are normal. Pupils are equal, round, and reactive to light. Right eye exhibits no discharge. Left eye exhibits no  discharge. No scleral icterus.  Neck: Normal range of motion. Neck supple. No JVD present. No tracheal deviation present. No thyromegaly present.  Cardiovascular: Normal rate, regular rhythm, normal heart sounds and intact distal pulses.   Pulmonary/Chest: Effort normal and breath sounds normal. No respiratory distress. She has no wheezes. She has no rales. She exhibits no tenderness.  Abdominal: Soft. Bowel sounds are normal. She exhibits no distension and no mass. There is no tenderness. There is no rebound and no guarding.  Musculoskeletal: Normal range of motion. She exhibits no edema or tenderness.  Lymphadenopathy:    She has no cervical adenopathy.  Neurological: She is alert and oriented to person, place, and time. Gait normal.  Skin: Skin is warm and dry. No rash noted. No erythema. No  pallor.  Psychiatric: Affect normal.  Nursing note and vitals reviewed.   LABORATORY DATA:. Appointment on 04/29/2016  Component Date Value Ref Range Status  . WBC 04/29/2016 6.2  3.9 - 10.3 10e3/uL Final  . NEUT# 04/29/2016 4.9  1.5 - 6.5 10e3/uL Final  . HGB 04/29/2016 10.4* 11.6 - 15.9 g/dL Final  . HCT 04/29/2016 31.7* 34.8 - 46.6 % Final  . Platelets 04/29/2016 156  145 - 400 10e3/uL Final  . MCV 04/29/2016 89.5  79.5 - 101.0 fL Final  . MCH 04/29/2016 29.3  25.1 - 34.0 pg Final  . MCHC 04/29/2016 32.7  31.5 - 36.0 g/dL Final  . RBC 04/29/2016 3.54* 3.70 - 5.45 10e6/uL Final  . RDW 04/29/2016 20.6* 11.2 - 14.5 % Final  . lymph# 04/29/2016 0.8* 0.9 - 3.3 10e3/uL Final  . MONO# 04/29/2016 0.4  0.1 - 0.9 10e3/uL Final  . Eosinophils Absolute 04/29/2016 0.0  0.0 - 0.5 10e3/uL Final  . Basophils Absolute 04/29/2016 0.1  0.0 - 0.1 10e3/uL Final  . NEUT% 04/29/2016 79.5* 38.4 - 76.8 % Final  . LYMPH% 04/29/2016 13.1* 14.0 - 49.7 % Final  . MONO% 04/29/2016 5.8  0.0 - 14.0 % Final  . EOS% 04/29/2016 0.6  0.0 - 7.0 % Final  . BASO% 04/29/2016 1.0  0.0 - 2.0 % Final  . Sodium 04/29/2016 140  136 - 145 mEq/L Final  . Potassium 04/29/2016 4.1  3.5 - 5.1 mEq/L Final  . Chloride 04/29/2016 108  98 - 109 mEq/L Final  . CO2 04/29/2016 21* 22 - 29 mEq/L Final  . Glucose 04/29/2016 93  70 - 140 mg/dl Final   Glucose reference range is for nonfasting patients. Fasting glucose reference range is 70- 100.  [name] BUN 04/29/2016 17.6  7.0 - 26.0 mg/dL Final  . Creatinine 04/29/2016 1.1  0.6 - 1.1 mg/dL Final  . Total Bilirubin 04/29/2016 0.71  0.20 - 1.20 mg/dL Final  . Alkaline Phosphatase 04/29/2016 76  40 - 150 U/L Final  . AST 04/29/2016 18  5 - 34 U/L Final  . ALT 04/29/2016 19  0 - 55 U/L Final  . Total Protein 04/29/2016 7.5  6.4 - 8.3 g/dL Final  . Albumin 04/29/2016 3.8  3.5 - 5.0 g/dL Final  . Calcium 04/29/2016 9.6  8.4 - 10.4 mg/dL Final  . Anion Gap 04/29/2016 11  3 - 11 mEq/L Final    . EGFR 04/29/2016 52* >90 ml/min/1.73 m2 Final   eGFR is calculated using the CKD-EPI Creatinine Equation (2009)  . Glucose 04/29/2016 Negative  Negative mg/dL Final  . Bilirubin (Urine) 04/29/2016 Negative  Negative Final  . Ketones 04/29/2016 Negative  Negative mg/dL Final  . Specific Gravity,  Urine 04/29/2016 1.010  1.003 - 1.035 Final  . Blood 04/29/2016 Trace  Negative Final  . pH 04/29/2016 7.5  4.6 - 8.0 Final  . Protein 04/29/2016 < 30  Negative- <30 mg/dL Final  . Urobilinogen, UR 04/29/2016 0.2  0.2 - 1 mg/dL Final  . Nitrite 04/29/2016 Negative  Negative Final  . Leukocyte Esterase 04/29/2016 Trace  Negative Final  . RBC / HPF 04/29/2016 0-2  0 - 2 Final  . WBC, UA 04/29/2016 3-6  0 - 2 Final  . Bacteria, UA 04/29/2016 Few  Negative- Trace Final  . Epithelial Cells 04/29/2016 Few  Negative- Few Final    RADIOGRAPHIC STUDIES: No results found.  ASSESSMENT/PLAN:    UTI (urinary tract infection) Patient states that she noticed some hematuria last week; and was seen by equal primary care.  She states that the hematuria has since resolved.  Patient denies any dysuria, frequency, urgency, or fever/chills recently.  However, urinalysis obtained today revealed:   Ref Range 1d ago    Glucose Negative mg/dL Negative   Bilirubin (Urine) Negative  Negative   Ketones Negative mg/dL Negative   Specific Gravity, Urine 1.003 - 1.035  1.010   Blood Negative  Trace   pH 4.6 - 8.0  7.5   Protein Negative- <30 mg/dL < 30   Urobilinogen, UR 0.2 - 1 mg/dL 0.2   Nitrite Negative  Negative   Leukocyte Esterase Negative  Trace   RBC / HPF 0 - 2  0-2   WBC, UA 0 - 2  3-6   Bacteria, UA Negative- Trace  Few   Epithelial Cells Negative- Few  Few        Advised patient that she may very well have a low-grade urinary tract infection.  Prescribed Cipro antibiotics for the patient for treatment of the UTI.  Depression Patient states that she has been taking Effexor  extended release capsule 75 mg on a daily basis for some time with fairly good effectiveness.  However, patient states that she has been feeling more depressed recently; and cries much more often.  She denies any suicidal or homicidal ideations.  After long discussion with both patient and her husband; advised that we could try increasing the Effexor 250 mg on a daily basis to see if this helps.  Also, will refer patient to both the cancer Center social worker and chaplain.  Breast cancer of upper-outer quadrant of right female breast [facility]) Patient continues to take Femara oral therapy as directed.  She reports questionable mild UTI symptoms; and also feels slightly more depressed.  Patient denies any other new symptoms.  She denies any recent fevers or chills.  Patient is scheduled for labs and a restaging PET scan on 05/02/2016.  She is scheduled for a follow-up visit on 05/09/2016.   Patient stated understanding of all instructions; and was in agreement with this plan of care. The patient knows to call the clinic with any problems, questions or concerns.   Total time spent with patient was 25 minutes;  with greater than 75 percent of that time spent in face to face counseling regarding patient's symptoms,  and coordination of care and follow up.  Disclaimer:This dictation was prepared with Dragon/digital dictation along with [employer]. Any transcriptional errors that result from this process are unintentional.  Drue Second, NP 04/30/2016

## 2016-04-30 NOTE — Assessment & Plan Note (Signed)
Patient states that she has been taking Effexor extended release capsule 75 mg on a daily basis for some time with fairly good effectiveness.  However, patient states that she has been feeling more depressed recently; and cries much more often.  She denies any suicidal or homicidal ideations.  After long discussion with both patient and her husband; advised that we could try increasing the Effexor 250 mg on a daily basis to see if this helps.  Also, will refer patient to both the cancer Center social worker and chaplain.

## 2016-04-30 NOTE — Assessment & Plan Note (Signed)
Patient states that she noticed some hematuria last week; and was seen by [facility].  She states that the hematuria has since resolved.  Patient denies any dysuria, frequency, urgency, or fever/chills recently.  However, urinalysis obtained today revealed:   Ref Range 1d ago    Glucose Negative mg/dL Negative   Bilirubin (Urine) Negative  Negative   Ketones Negative mg/dL Negative   Specific Gravity, Urine 1.003 - 1.035  1.010   Blood Negative  Trace   pH 4.6 - 8.0  7.5   Protein Negative- <30 mg/dL < 30   Urobilinogen, UR 0.2 - 1 mg/dL 0.2   Nitrite Negative  Negative   Leukocyte Esterase Negative  Trace   RBC / HPF 0 - 2  0-2   WBC, UA 0 - 2  3-6   Bacteria, UA Negative- Trace  Few   Epithelial Cells Negative- Few  Few        Advised patient that she may very well have a low-grade urinary tract infection.  Prescribed Cipro antibiotics for the patient for treatment of the UTI.

## 2016-05-02 ENCOUNTER — Telehealth: Payer: Self-pay | Admitting: *Deleted

## 2016-05-02 ENCOUNTER — Other Ambulatory Visit (HOSPITAL_BASED_OUTPATIENT_CLINIC_OR_DEPARTMENT_OTHER): Payer: BLUE CROSS/BLUE SHIELD

## 2016-05-02 ENCOUNTER — Ambulatory Visit (HOSPITAL_COMMUNITY)
Admission: RE | Admit: 2016-05-02 | Discharge: 2016-05-02 | Disposition: A | Discharge status: Home / Self Care | Payer: BLUE CROSS/BLUE SHIELD | Source: Ambulatory Visit | Attending: Oncology | Admitting: Oncology

## 2016-05-02 DIAGNOSIS — K922 Gastrointestinal hemorrhage, unspecified: Secondary | ICD-10-CM | POA: Diagnosis not present

## 2016-05-02 DIAGNOSIS — C7800 Secondary malignant neoplasm of unspecified lung: Secondary | ICD-10-CM

## 2016-05-02 DIAGNOSIS — C50411 Malignant neoplasm of upper-outer quadrant of right female breast: Secondary | ICD-10-CM | POA: Diagnosis not present

## 2016-05-02 DIAGNOSIS — C50911 Malignant neoplasm of unspecified site of right female breast: Secondary | ICD-10-CM | POA: Diagnosis not present

## 2016-05-02 DIAGNOSIS — R590 Localized enlarged lymph nodes: Secondary | ICD-10-CM | POA: Diagnosis not present

## 2016-05-02 LAB — COMPREHENSIVE METABOLIC PANEL
ALBUMIN: 3.8 g/dL (ref 3.5–5.0)
ALK PHOS: 76 U/L (ref 40–150)
ALT: 16 U/L (ref 0–55)
AST: 18 U/L (ref 5–34)
Anion Gap: 10 mEq/L (ref 3–11)
BUN: 16.9 mg/dL (ref 7.0–26.0)
CHLORIDE: 108 meq/L (ref 98–109)
CO2: 22 mEq/L (ref 22–29)
CREATININE: 1.1 mg/dL (ref 0.6–1.1)
Calcium: 9.2 mg/dL (ref 8.4–10.4)
EGFR: 52 mL/min/{1.73_m2} — ABNORMAL LOW (ref >90)
GLUCOSE: 95 mg/dL (ref 70–140)
POTASSIUM: 4.5 meq/L (ref 3.5–5.1)
SODIUM: 141 meq/L (ref 136–145)
Total Bilirubin: 0.53 mg/dL (ref 0.20–1.20)
Total Protein: 7.1 g/dL (ref 6.4–8.3)

## 2016-05-02 LAB — CBC WITH DIFFERENTIAL/PLATELET
BASO%: 0.7 % (ref 0.0–2.0)
BASOS ABS: 0 10*3/uL (ref 0.0–0.1)
EOS%: 0.9 % (ref 0.0–7.0)
Eosinophils Absolute: 0.1 10*3/uL (ref 0.0–0.5)
HEMATOCRIT: 32.2 % — AB (ref 34.8–46.6)
HEMOGLOBIN: 10.5 g/dL — AB (ref 11.6–15.9)
LYMPH#: 0.7 10*3/uL — AB (ref 0.9–3.3)
LYMPH%: 11.2 % — ABNORMAL LOW (ref 14.0–49.7)
MCH: 29.5 pg (ref 25.1–34.0)
MCHC: 32.4 g/dL (ref 31.5–36.0)
MCV: 91 fL (ref 79.5–101.0)
MONO#: 0.4 10*3/uL (ref 0.1–0.9)
MONO%: 6.9 % (ref 0.0–14.0)
NEUT#: 5 10*3/uL (ref 1.5–6.5)
NEUT%: 80.3 % — ABNORMAL HIGH (ref 38.4–76.8)
Platelets: 158 10*3/uL (ref 145–400)
RBC: 3.54 10*6/uL — ABNORMAL LOW (ref 3.70–5.45)
RDW: 20.8 % — AB (ref 11.2–14.5)
WBC: 6.3 10*3/uL (ref 3.9–10.3)

## 2016-05-02 LAB — GLUCOSE, CAPILLARY: Glucose-Capillary: 95 mg/dL (ref 65–99)

## 2016-05-02 MED ORDER — FLUDEOXYGLUCOSE F - 18 (FDG) INJECTION
6.8000 | Freq: Once | INTRAVENOUS | Status: AC | PRN
  Administered 2016-05-02: 6.8 via INTRAVENOUS

## 2016-05-02 NOTE — Telephone Encounter (Signed)
TC to pt to follow up [facility] visit. Pt reports no incidents of the wave of emotion passing over her. She reports more cheerful thoughts and the ability to let "things" slide off her better. Pt states they are in the process of packing for a family gathering out of town. She is excited and happy to be attending this function. Pt understands to call this office with any concerns or questions.

## 2016-05-09 ENCOUNTER — Encounter: Payer: Self-pay | Admitting: Nurse Practitioner

## 2016-05-09 ENCOUNTER — Telehealth: Payer: Self-pay | Admitting: Nurse Practitioner

## 2016-05-09 ENCOUNTER — Ambulatory Visit (HOSPITAL_BASED_OUTPATIENT_CLINIC_OR_DEPARTMENT_OTHER): Payer: BLUE CROSS/BLUE SHIELD | Admitting: Nurse Practitioner

## 2016-05-09 VITALS — BP 137/88 | HR 97 | Temp 97.7°F | Resp 18 | Ht 64.0 in | Wt 139.9 lb

## 2016-05-09 DIAGNOSIS — C7801 Secondary malignant neoplasm of right lung: Secondary | ICD-10-CM

## 2016-05-09 DIAGNOSIS — B001 Herpesviral vesicular dermatitis: Secondary | ICD-10-CM | POA: Diagnosis not present

## 2016-05-09 DIAGNOSIS — C50411 Malignant neoplasm of upper-outer quadrant of right female breast: Secondary | ICD-10-CM | POA: Diagnosis not present

## 2016-05-09 DIAGNOSIS — M859 Disorder of bone density and structure, unspecified: Secondary | ICD-10-CM

## 2016-05-09 DIAGNOSIS — I864 Gastric varices: Secondary | ICD-10-CM | POA: Diagnosis not present

## 2016-05-09 DIAGNOSIS — C7802 Secondary malignant neoplasm of left lung: Secondary | ICD-10-CM

## 2016-05-09 DIAGNOSIS — C50911 Malignant neoplasm of unspecified site of right female breast: Secondary | ICD-10-CM

## 2016-05-09 DIAGNOSIS — M810 Age-related osteoporosis without current pathological fracture: Secondary | ICD-10-CM

## 2016-05-09 DIAGNOSIS — C7800 Secondary malignant neoplasm of unspecified lung: Principal | ICD-10-CM

## 2016-05-09 DIAGNOSIS — C773 Secondary and unspecified malignant neoplasm of axilla and upper limb lymph nodes: Secondary | ICD-10-CM

## 2016-05-09 MED ORDER — VALACYCLOVIR HCL 500 MG PO TABS: 500.0000 mg | ORAL_TABLET | Freq: Two times a day (BID) | ORAL | Status: DC

## 2016-05-09 NOTE — Telephone Encounter (Signed)
appt made and avs printed °

## 2016-05-09 NOTE — Progress Notes (Signed)
ID: Nicole [name] [name]   DOB: [birth year]  MR#: [id number]  WER#:[id number]  PCP:  GYN:  SURolm Bookbinder, MD OTHER MD: Gery Pray, MD  CHIEF COMPLAINT:  Metastatic Breast Cancer  CURRENT TREATMENT: Letrozole, alendronate  BREAST CANCER HISTORY: From Dr. Collier Salina Rubin's 07/25/2010 note:  "This woman has not had any significant medical intervention for some time.  Her last mammogram was about seven years ago.  She noted a right breast mass about a year ago and did not seek immediate medical attention for this.  She ultimately, after some time, admitted this to her husband and self-referred herself for intervention.  She had a mammogram on 07/17/2010 with an ultrasound of the right breast.  This showed a lobulated mass upper right outer quadrant measuring at least 6.3 x 7.3 cm and large right axillary lymph nodes were also noted.  There was skin thickening overlying the mass.  On physical exam, this mass was about an 8 cm with skin dimpling noted. Discolored area in the skin over the mass, fullness in the axilla.  Biopsy was recommended, which took place on 07/17/2010.  Pathology showed invasive ductal cancer involving both lymph node and breast.  The HER-2 was not amplified.  ER and PR both positive at 100% and 6% respectively.  Proliferative index was 12% involving both the lymph node and breast mass.  An MRI scan of both breasts was performed on 07/22/2010, essentially which showed a large heterogeneously enhancing mass of the right breast with washout kinetics measuring 6.4 x 4.4 x 5.0 cm.  Numerous satellite nodules were seen throughout the dominant mass.  There were several suspicious nodules in the upper central and upper inner quadrant of the right breast.  There were two enhancing subcentimeter nodules seen in the right pectoralis muscle.  No obvious chest wall nodules were seen; however, there was seen some metastatic adenopathy in the mediastinum as well as hila with a 2.4 x 2.0 cm mediastinal  lymph node as well as 1.2 x 3.0 cm subcarinal lymph node.  There was a right hilar and probable left hilar adenopathy.  Bilateral pulmonary nodules were seen and a right T2 lesion was seen anterior right liver as well."  Her subsequent history is as detailed below.  INTERVAL HISTORY: Nicole [name] [name] returns today for followup of her estrogen receptor positive breast cancer, accompanied by her husband Nicole [name] [name]. She continues on letrozole and tolerates this drug well with no complaints. She denies excessive hot flashes, vaginal changes, or increased joint pain. She is also on alendronate weekly for her osteoporosis. The interval history is remarkable for a 4 day hospitalization for a GI bleed. She presented to the ED with weakness, fatigue, and dark stools. Several esophageal varices were determined to be the cause, possibly secondary to radiation therapy in the past. She is working with Dr. Watt Climes in the outpatient setting. She is expecting a repeat CT of the abdomen and pelvis next week and another endoscopy later this month.   REVIEW OF SYSTEMS:  Harleen denies fevers, chills, nausea, or vomiting. Her stools are normal colored. Her appetite is good. She has good energy. She does have a fever blister to her right upper lip, which she believes is brought on by stress. She denies shortness of breath, chest pain, cough, or palpitations. She has no unusual headaches, dizziness, or vision changes. A detailed review of systems is otherwise stable.  PAST MEDICAL HISTORY: Past Medical History  Diagnosis Date  . IBD (inflammatory bowel disease)  resolved  . Heart murmur     mild, no cardiologist  . Blood transfusion 2012  . Radiation 07/21/12-09/07/12    5940 cGy  . Breast CA (HCC)     breast ca dx 06/2010, stage 4, right     PAST SURGICAL HISTORY: Past Surgical History  Procedure Laterality Date  . Portacath placement  2011    left side  . Dilation and curettage of uterus  2004?  [name] Mastectomy  06/02/12    right  breast  . Port-a-cath removal    . Esophagogastroduodenoscopy (egd) with propofol N/A 04/11/2016    Procedure: ESOPHAGOGASTRODUODENOSCOPY (EGD) WITH PROPOFOL;  Surgeon: Vida Rigger, MD;  Location: [facility];  Service: Endoscopy;  Laterality: N/A;  . Radiology with anesthesia N/A 04/13/2016    Procedure: RADIOLOGY WITH ANESTHESIA;  Surgeon: Gilmer Mor, DO;  Location: [facility] OR;  Service: Anesthesiology;  Laterality: N/A;    FAMILY HISTORY Family History  Problem Relation Age of Onset  . Cancer Neg Hx   . COPD Mother   . AAA (abdominal aortic aneurysm) Mother   . COPD Father    patient's father died at the age of 21 from heart disease. Patient's mother died at the age of 13 status post CABG. The patient had no brothers, 2 sisters. There is no history of breast or ovarian cancer in the family.  GYNECOLOGIC HISTORY:  (Updated 03/14/2014)  Menarche age 74, she is GX P0. She does not recall when she went through menopause. She never took hormone replacement.  SOCIAL HISTORY:  (updated 03/14/2014) Nicole [name] [name]. Husband Nicole [name] [name] is retired from [employer]. They have no children and no pets. They attend a local [address].   ADVANCED DIRECTIVES: Not in place  HEALTH MAINTENANCE: (updated 03/14/2014)  Social History  Substance Use Topics  . Smoking status: Never Smoker   . Smokeless tobacco: Never Used  . Alcohol Use: No     Colonoscopy: Never  PAP: Remote  Bone density: 09/27/2013, osteoporosis with a T score of -2.8  Lipid panel: Not on file   No Known Allergies  Current Outpatient Prescriptions  Medication Sig Dispense Refill  . alendronate (FOSAMAX) 70 MG tablet TAKE 1 TABLET BY MOUTH ONCE A WEEK 12 tablet 1  . Calcium Carbonate (CALTRATE 600 PO) Take by mouth daily.     . Cholecalciferol (VITAMIN D PO) Take 5,000 Units by mouth daily.     [name] letrozole (FEMARA) 2.5 MG tablet Take 1 tablet (2.5 mg total) by mouth  daily. 90 tablet 2  . venlafaxine XR (EFFEXOR XR) 150 MG 24 hr capsule Take 1 capsule (150 mg total) by mouth daily with breakfast. 30 capsule 1   No current facility-administered medications for this visit.    OBJECTIVE: Middle-aged white womanwho appears well Filed Vitals:   05/09/16 0941  BP: 137/88  Pulse: 97  Temp: 97.7 F (36.5 C)  Resp: 18     Body mass index is 24 kg/(m^2).    ECOG FS: 0 Filed Weights   05/09/16 0941  Weight: 139 lb 14.4 oz (63.458 kg)   Skin: warm, dry, herpetic lesion to right upper lip  HEENT: sclerae anicteric, conjunctivae pink, oropharynx clear. No thrush or mucositis.  Lymph Nodes: No cervical or supraclavicular lymphadenopathy  Lungs: clear to auscultation bilaterally, no rales, wheezes, or rhonci  Heart: regular rate and rhythm  Abdomen: round, soft, non tender, positive bowel sounds  Musculoskeletal: No focal  spinal tenderness, no peripheral edema  Neuro: non focal, well oriented, positive affect  Breast: right breast status post lumpectomy and radiation. Widespread telangiectasias across right chest. No evidence of chest wall recurrence right axilla benign. Left breast unremarkable.    LAB RESULTS: Lab Results  Component Value Date   WBC 6.3 05/02/2016   NEUTROABS 5.0 05/02/2016   HGB 10.5* 05/02/2016   HCT 32.2* 05/02/2016   MCV 91.0 05/02/2016   PLT 158 05/02/2016      Chemistry      Component Value Date/Time   NA 141 05/02/2016 0929   NA 140 04/12/2016 0215   K 4.5 05/02/2016 0929   K 3.6 04/12/2016 0215   CL 112* 04/12/2016 0215   CL 109* 04/20/2013 0859   CO2 22 05/02/2016 0929   CO2 23 04/12/2016 0215   BUN 16.9 05/02/2016 0929   BUN 17 04/12/2016 0215   CREATININE 1.1 05/02/2016 0929   CREATININE 0.82 04/12/2016 0215      Component Value Date/Time   CALCIUM 9.2 05/02/2016 0929   CALCIUM 7.6* 04/12/2016 0215   ALKPHOS 76 05/02/2016 0929   ALKPHOS 59 08/21/2012 1307   AST 18 05/02/2016 0929   AST 24 08/21/2012  1307   ALT 16 05/02/2016 0929   ALT 30 08/21/2012 1307   BILITOT 0.53 05/02/2016 0929   BILITOT 0.6 08/21/2012 1307        STUDIES: Ct Abdomen Pelvis W Wo Contrast  04/11/2016  CLINICAL DATA:  60 year old female with anemia and gastric varices on endoscopy. Evaluate for BRTO candidacy. EXAM: CT ABDOMEN AND PELVIS WITHOUT AND WITH CONTRAST TECHNIQUE: Multidetector CT imaging of the abdomen and pelvis was performed following the standard protocol before and following the bolus administration of intravenous contrast. CONTRAST:  127m ISOVUE-300 IOPAMIDOL (ISOVUE-300) INJECTION 61% COMPARISON:  Prior CT scan of the abdomen and pelvis 12/02/2012 FINDINGS: Lower chest: No acute findings. The intracardiac blood pool is hypodense relative to the adjacent myocardium consistent with anemia. Hepatobiliary: The liver does not appear overtly cirrhotic. There is no discrete solid hepatic lesion. Stable cyst in the posterior aspect of hepatic segment 6. The portal vein is normal in size. The hepatic veins are normal in size and patent. Please see vascular section below for further detail. Gallbladder is unremarkable. No intra or extrahepatic biliary ductal dilatation. Pancreas: No mass, inflammatory changes, or other significant abnormality. Spleen: Within normal limits in size and appearance. Adrenals/Urinary Tract: No masses identified. No evidence of hydronephrosis. Stomach/Bowel: No evidence of obstruction, inflammatory process, or abnormal fluid collections. Vascular/Lymphatic: Large gastrorenal shunt resulting in extensive gastric varices in the gastric fundus and cardia. This is almost certainly the source of the patient's bleeding. Additionally, there are small esophageal varices. The primary afferent inflow is via the posterior gastric vein. Additionally, there are contributions from small short gastric veins. No suspicious adenopathy. Reproductive: No mass or other significant abnormality. Other: None.  Musculoskeletal: Chronic appearing L1 compression fracture with approximately 40% height loss anteriorly. No significant retropulsion or exaggerated kyphosis. Multilevel degenerative disc disease most noticeable at L4-L5 and L5-S1. No acute fracture or aggressive appearing lytic or blastic osseous lesion. IMPRESSION: 1. Large gastrorenal shunt resulting in extensive gastric varices. The primary afferent inflow is via the posterior gastric vein with lesser contributions from the short gastric veins. The anatomy is amenable to balloon occluded retrograde transvenous obliteration (BRTO). Please see coronal reformatted images for further anatomic detail. 2. Surprisingly, the liver does not appear overtly cirrhotic. However, the presence of a  gastro renal shunt is highly suggestive of underlying cirrhosis. 3. The splenic vein remains patent. 4. Chronic appearing L1 compression fracture with approximately 40% height loss. 5. Multilevel degenerative disc disease. 6. Anemia. Signed, Criselda Peaches, MD [facility] Radiology Electronically Signed   By: Jacqulynn Cadet M.D.   On: 04/11/2016 17:30   Ir Angiogram Selective Each Additional Vessel  04/13/2016  CLINICAL DATA:  60 year old female with a history of bleeding gastric varices. She has no significant ascites and low risk esophageal varices. Balloon occluded retrograde trans venous obliteration is indicated as the solution for bleeding gastric varices. EXAM: IR EMBO ART VEN HEMORR LYMPH EXTRAV INC GUIDE ROADMAPPING; ADDITIONAL ARTERIOGRAPHY; IR ULTRASOUND GUIDANCE VASC ACCESS RIGHT; LEFT RENAL VENOGRAPHY; ARTERIOGRAPHY MEDICATIONS: No additional ANESTHESIA/SEDATION: General - as administered by the Anesthesia department CONTRAST:  1 ISOVUE-300 IOPAMIDOL (ISOVUE-300) INJECTION 61% FLUOROSCOPY TIME:  Fluoroscopy Time: 49 minutes 0 seconds COMPLICATIONS: None PROCEDURE: Informed written consent was obtained from the  patient and the patient's family after a thorough discussion of the procedural risks, benefits and alternatives. Specific risks that were addressed regarding BRTO include, bleeding, infection, contrast reaction, kidney injury, need for further procedure or surgery including TIPS and/ or endoscopy, pulmonary embolism, stroke, worsening of ascites, worsening of esophageal varices, paradoxical liver failure, varix rupture, cardiopulmonary collapse, death. All questions were addressed. Maximal Sterile Barrier Technique was utilized including caps, mask, sterile gowns, sterile gloves, sterile drape, hand hygiene and skin antiseptic. A timeout was performed prior to the initiation of the procedure. Ultrasound survey of the right inguinal region was performed with images stored and sent to PACs. A single wall puncture needle was used access the right common femoral artery under ultrasound guidance with saline syringe. With venous blood flow returned, a Bentson wire was observed to enter the common femoral vein and the IVC under fluoroscopy. The needle was removed, and a small incision was made, and 9 [place] dilator was passed over the Bentson wire. A 10 French TIPS sheath was then passed over the Bentson wire after manually creating a significant leftward current on the catheter. The inner dilator and wire were removed, and a 5 [place] cobra catheter was passed over the Bentson wire into the IVC. Cobra catheter was used to select the left renal vein orifice. Combination of the Bentson wire, Glidewire, Rosen wire were used to gain purchased into the renal vein, with renal vein limited venogram performed to identify the in flowing gastro renal shunt. With a safety curve on a stiff Amplatz wire position into superior pole renal vein, the TIPS sheath was advanced into the left renal vein over the Cobra catheter. Pullback reinforced straight sheath selection technique (PRESSS) was performed, and as the tip sheath was withdrawn,  contrast was used to identified the inflow of the gastro renal shunt. With the coaxial Amplatz wire in position, a combination of a standard Kumpe the catheter and a 4 French angled glide catheter were used to cannulate the orifice of the gastro renal shunt, and 11 o'clock position from the in flowing shunt opposite the adrenal vein inflow. Once the Glidewire was advanced into the shunt, the glide catheter was advanced, and the Glidewire was exchanged for a stiff Amplatz wire with a safety curve on the tip. The renal vein buddy wire was withdrawn, and the TIPS sheath was advanced into the gastro renal shunt. The glide catheter was withdrawn and then a balloon occlusion catheter was advanced over the Amplatz wire into the gastro renal  shunt. Amplatz wire was then withdrawn. Angiogram was then performed to confirmed location within the gastro renal shunt outflow. Balloon was inflated on the balloon occlusion catheter and was withdrawn to the first encountered web to achieve hemostasis. Standard angiogram was performed.  CO2 angiogram was also performed. A penumbra Lantern microcatheter and a fathom wire were navigated into the shunt. Once we confirmed location within the outflow with standard angiogram, treatment was initiated. A foamed solution of detergent/sclerosant was mixed in a standard Tessari method, with 1:2:3 ratio of ethiodized oil: sodium tetradecyl sulfate: Room air. The sclerosed sent was infused under fluoroscopic guidance, with observation of reflux into the afferent veins. Once reflux was identified into the posterior gastric vein, sclerosing was completed. The micro catheter was withdrawn to the tip of the balloon occlusion catheter, and coil mass was deposited with deployment of multiple Ruby coils. 4 standard Ruby coils and 1 soft ruby coil deployed. After 20 minutes, the balloon was deflated under fluoroscopy to observe the foam mass. Once we were confident there was no migration, balloon was  withdrawn and the sheath was withdrawn. Hemostasis was achieved with manual compression. Patient remained hemodynamically stable throughout. No complications were encountered and no significant blood loss was encountered. IMPRESSION: Status post coil assisted balloon occluded retrograde trans venous obliteration (BRTO) for treatment of bleeding gastric varices. Signed, Dulcy Fanny. Earleen Newport, DO [facility] Radiology Electronically Signed   By: Corrie Mckusick D.O.   On: 04/13/2016 15:06   Ir Venogram Renal Uni Left  04/13/2016  CLINICAL DATA:  61 year old female with a history of bleeding gastric varices. She has no significant ascites and low risk esophageal varices. Balloon occluded retrograde trans venous obliteration is indicated as the solution for bleeding gastric varices. EXAM: IR EMBO ART VEN HEMORR LYMPH EXTRAV INC GUIDE ROADMAPPING; ADDITIONAL ARTERIOGRAPHY; IR ULTRASOUND GUIDANCE VASC ACCESS RIGHT; LEFT RENAL VENOGRAPHY; ARTERIOGRAPHY MEDICATIONS: No additional ANESTHESIA/SEDATION: General - as administered by the Anesthesia department CONTRAST:  1 ISOVUE-300 IOPAMIDOL (ISOVUE-300) INJECTION 61% FLUOROSCOPY TIME:  Fluoroscopy Time: 49 minutes 0 seconds COMPLICATIONS: None PROCEDURE: Informed written consent was obtained from the patient and the patient's family after a thorough discussion of the procedural risks, benefits and alternatives. Specific risks that were addressed regarding BRTO include, bleeding, infection, contrast reaction, kidney injury, need for further procedure or surgery including TIPS and/ or endoscopy, pulmonary embolism, stroke, worsening of ascites, worsening of esophageal varices, paradoxical liver failure, varix rupture, cardiopulmonary collapse, death. All questions were addressed. Maximal Sterile Barrier Technique was utilized including caps, mask, sterile gowns, sterile gloves, sterile drape, hand hygiene and skin antiseptic. A timeout  was performed prior to the initiation of the procedure. Ultrasound survey of the right inguinal region was performed with images stored and sent to PACs. A single wall puncture needle was used access the right common femoral artery under ultrasound guidance with saline syringe. With venous blood flow returned, a Bentson wire was observed to enter the common femoral vein and the IVC under fluoroscopy. The needle was removed, and a small incision was made, and 9 [place] dilator was passed over the Bentson wire. A 10 French TIPS sheath was then passed over the Bentson wire after manually creating a significant leftward current on the catheter. The inner dilator and wire were removed, and a 5 [place] cobra catheter was passed over the Bentson wire into the IVC. Cobra catheter was used to select the left renal vein orifice. Combination of the Bentson wire, Glidewire, Rosen wire were  used to gain purchased into the renal vein, with renal vein limited venogram performed to identify the in flowing gastro renal shunt. With a safety curve on a stiff Amplatz wire position into superior pole renal vein, the TIPS sheath was advanced into the left renal vein over the Cobra catheter. Pullback reinforced straight sheath selection technique (PRESSS) was performed, and as the tip sheath was withdrawn, contrast was used to identified the inflow of the gastro renal shunt. With the coaxial Amplatz wire in position, a combination of a standard Kumpe the catheter and a 4 French angled glide catheter were used to cannulate the orifice of the gastro renal shunt, and 11 o'clock position from the in flowing shunt opposite the adrenal vein inflow. Once the Glidewire was advanced into the shunt, the glide catheter was advanced, and the Glidewire was exchanged for a stiff Amplatz wire with a safety curve on the tip. The renal vein buddy wire was withdrawn, and the TIPS sheath was advanced into the gastro renal shunt. The glide catheter was  withdrawn and then a balloon occlusion catheter was advanced over the Amplatz wire into the gastro renal shunt. Amplatz wire was then withdrawn. Angiogram was then performed to confirmed location within the gastro renal shunt outflow. Balloon was inflated on the balloon occlusion catheter and was withdrawn to the first encountered web to achieve hemostasis. Standard angiogram was performed.  CO2 angiogram was also performed. A penumbra Lantern microcatheter and a fathom wire were navigated into the shunt. Once we confirmed location within the outflow with standard angiogram, treatment was initiated. A foamed solution of detergent/sclerosant was mixed in a standard Tessari method, with 1:2:3 ratio of ethiodized oil: sodium tetradecyl sulfate: Room air. The sclerosed sent was infused under fluoroscopic guidance, with observation of reflux into the afferent veins. Once reflux was identified into the posterior gastric vein, sclerosing was completed. The micro catheter was withdrawn to the tip of the balloon occlusion catheter, and coil mass was deposited with deployment of multiple Ruby coils. 4 standard Ruby coils and 1 soft ruby coil deployed. After 20 minutes, the balloon was deflated under fluoroscopy to observe the foam mass. Once we were confident there was no migration, balloon was withdrawn and the sheath was withdrawn. Hemostasis was achieved with manual compression. Patient remained hemodynamically stable throughout. No complications were encountered and no significant blood loss was encountered. IMPRESSION: Status post coil assisted balloon occluded retrograde trans venous obliteration (BRTO) for treatment of bleeding gastric varices. Signed, Dulcy Fanny. Earleen Newport, DO [facility] Radiology Electronically Signed   By: Corrie Mckusick D.O.   On: 04/13/2016 15:06   Ir Angiogram Follow Up Study  04/13/2016  CLINICAL DATA:  60 year old female with a history of bleeding  gastric varices. She has no significant ascites and low risk esophageal varices. Balloon occluded retrograde trans venous obliteration is indicated as the solution for bleeding gastric varices. EXAM: IR EMBO ART VEN HEMORR LYMPH EXTRAV INC GUIDE ROADMAPPING; ADDITIONAL ARTERIOGRAPHY; IR ULTRASOUND GUIDANCE VASC ACCESS RIGHT; LEFT RENAL VENOGRAPHY; ARTERIOGRAPHY MEDICATIONS: No additional ANESTHESIA/SEDATION: General - as administered by the Anesthesia department CONTRAST:  1 ISOVUE-300 IOPAMIDOL (ISOVUE-300) INJECTION 61% FLUOROSCOPY TIME:  Fluoroscopy Time: 49 minutes 0 seconds COMPLICATIONS: None PROCEDURE: Informed written consent was obtained from the patient and the patient's family after a thorough discussion of the procedural risks, benefits and alternatives. Specific risks that were addressed regarding BRTO include, bleeding, infection, contrast reaction, kidney injury, need for further procedure or surgery including TIPS and/  or endoscopy, pulmonary embolism, stroke, worsening of ascites, worsening of esophageal varices, paradoxical liver failure, varix rupture, cardiopulmonary collapse, death. All questions were addressed. Maximal Sterile Barrier Technique was utilized including caps, mask, sterile gowns, sterile gloves, sterile drape, hand hygiene and skin antiseptic. A timeout was performed prior to the initiation of the procedure. Ultrasound survey of the right inguinal region was performed with images stored and sent to PACs. A single wall puncture needle was used access the right common femoral artery under ultrasound guidance with saline syringe. With venous blood flow returned, a Bentson wire was observed to enter the common femoral vein and the IVC under fluoroscopy. The needle was removed, and a small incision was made, and 9 [place] dilator was passed over the Bentson wire. A 10 French TIPS sheath was then passed over the Bentson wire after manually creating a significant leftward current on the  catheter. The inner dilator and wire were removed, and a 5 [place] cobra catheter was passed over the Bentson wire into the IVC. Cobra catheter was used to select the left renal vein orifice. Combination of the Bentson wire, Glidewire, Rosen wire were used to gain purchased into the renal vein, with renal vein limited venogram performed to identify the in flowing gastro renal shunt. With a safety curve on a stiff Amplatz wire position into superior pole renal vein, the TIPS sheath was advanced into the left renal vein over the Cobra catheter. Pullback reinforced straight sheath selection technique (PRESSS) was performed, and as the tip sheath was withdrawn, contrast was used to identified the inflow of the gastro renal shunt. With the coaxial Amplatz wire in position, a combination of a standard Kumpe the catheter and a 4 French angled glide catheter were used to cannulate the orifice of the gastro renal shunt, and 11 o'clock position from the in flowing shunt opposite the adrenal vein inflow. Once the Glidewire was advanced into the shunt, the glide catheter was advanced, and the Glidewire was exchanged for a stiff Amplatz wire with a safety curve on the tip. The renal vein buddy wire was withdrawn, and the TIPS sheath was advanced into the gastro renal shunt. The glide catheter was withdrawn and then a balloon occlusion catheter was advanced over the Amplatz wire into the gastro renal shunt. Amplatz wire was then withdrawn. Angiogram was then performed to confirmed location within the gastro renal shunt outflow. Balloon was inflated on the balloon occlusion catheter and was withdrawn to the first encountered web to achieve hemostasis. Standard angiogram was performed.  CO2 angiogram was also performed. A penumbra Lantern microcatheter and a fathom wire were navigated into the shunt. Once we confirmed location within the outflow with standard angiogram, treatment was initiated. A foamed solution of  detergent/sclerosant was mixed in a standard Tessari method, with 1:2:3 ratio of ethiodized oil: sodium tetradecyl sulfate: Room air. The sclerosed sent was infused under fluoroscopic guidance, with observation of reflux into the afferent veins. Once reflux was identified into the posterior gastric vein, sclerosing was completed. The micro catheter was withdrawn to the tip of the balloon occlusion catheter, and coil mass was deposited with deployment of multiple Ruby coils. 4 standard Ruby coils and 1 soft ruby coil deployed. After 20 minutes, the balloon was deflated under fluoroscopy to observe the foam mass. Once we were confident there was no migration, balloon was withdrawn and the sheath was withdrawn. Hemostasis was achieved with manual compression. Patient remained hemodynamically stable throughout. No complications were encountered and no significant blood  loss was encountered. IMPRESSION: Status post coil assisted balloon occluded retrograde trans venous obliteration (BRTO) for treatment of bleeding gastric varices. Signed, Dulcy Fanny. Earleen Newport, DO [facility] Radiology Electronically Signed   By: Corrie Mckusick D.O.   On: 04/13/2016 15:06   Nm Pet Image Restag (ps) Skull Base To Thigh  05/02/2016  CLINICAL DATA:  Subsequent treatment strategy for restaging of right-sided breast cancer. EXAM: NUCLEAR MEDICINE PET SKULL BASE TO THIGH TECHNIQUE: 6.6 mCi F-18 FDG was injected intravenously. Full-ring PET imaging was performed from the skull base to thigh after the radiotracer. CT data was obtained and used for attenuation correction and anatomic localization. FASTING BLOOD GLUCOSE:  Value: 95 mg/dl COMPARISON:  Abdominal CT of 04/16/20167. Chest CT 10/02/2015. Prior PET 02/12/2012 FINDINGS: NECK No areas of abnormal hypermetabolism. CHEST No pulmonary parenchymal hypermetabolism. A low right paratracheal node measures 8 mm and a S.U.V. max of 4.7. On the prior PET,  this was similar in size and measured a S.U.V. max of 2.3. Right hilar hypermetabolism without well-defined adenopathy. This measures a S.U.V. max of 6.38 today versus a S.U.V. max of 4.1 on the prior. ABDOMEN/PELVIS No abdominal pelvic nodal hypermetabolism. A focus of hypermetabolism about the skin of the right groin measures a S.U.V. max of 2.0 and is without well-defined CT correlate. Example image 175/series 4. SKELETON No abnormal marrow activity. CT IMAGES PERFORMED FOR ATTENUATION CORRECTION Left carotid atherosclerosis No cervical adenopathy. Right mastectomy and axillary node dissection. Tiny hiatal hernia. Suspicion of patchy radiation fibrosis throughout the right lung. 4 mm left lower lobe pulmonary nodule on image 34/series 8 is likely similar on 10/02/2015 diagnostic CT. Subtle left upper lobe nodularity is favored to be post infectious or inflammatory and grossly similar to on the prior exam. Abdominal pelvic findings deferred to recent diagnostic CT. Cirrhosis. Status post BRTO. Splenomegaly. Resolved ascites. Colonic stool burden suggests constipation. Probable punctate right renal collecting system calculus. Hysterectomy. Chronic compression deformity involving the L1 vertebral body. IMPRESSION: 1. Hypermetabolism involving a small right paratracheal node and within the right hilum (without nodal correlate). Appearance is indeterminate. Given absence of adenopathy, favored to be reactive. Isolated, somewhat atypical appearance of breast cancer metastasis could look similar. 2. Otherwise, no evidence of metastatic disease. 3. Left upper and less so left lower lobe pulmonary nodularity is favored to be similar to 10/02/2015 and can be re-evaluated at follow-up. These areas are below PET resolution. 4. Right groin skin hypermetabolism without CT correlate. Consider physical exam correlation. Electronically Signed   By: Abigail Miyamoto M.D.   On: 05/02/2016 09:45   Ir US Guide Vasc Access  Right  04/13/2016  CLINICAL DATA:  60 year old female with a history of bleeding gastric varices. She has no significant ascites and low risk esophageal varices. Balloon occluded retrograde trans venous obliteration is indicated as the solution for bleeding gastric varices. EXAM: IR EMBO ART VEN HEMORR LYMPH EXTRAV INC GUIDE ROADMAPPING; ADDITIONAL ARTERIOGRAPHY; IR ULTRASOUND GUIDANCE VASC ACCESS RIGHT; LEFT RENAL VENOGRAPHY; ARTERIOGRAPHY MEDICATIONS: No additional ANESTHESIA/SEDATION: General - as administered by the Anesthesia department CONTRAST:  1 ISOVUE-300 IOPAMIDOL (ISOVUE-300) INJECTION 61% FLUOROSCOPY TIME:  Fluoroscopy Time: 49 minutes 0 seconds COMPLICATIONS: None PROCEDURE: Informed written consent was obtained from the patient and the patient's family after a thorough discussion of the procedural risks, benefits and alternatives. Specific risks that were addressed regarding BRTO include, bleeding, infection, contrast reaction, kidney injury, need for further procedure or surgery including TIPS and/ or endoscopy, pulmonary embolism, stroke,  worsening of ascites, worsening of esophageal varices, paradoxical liver failure, varix rupture, cardiopulmonary collapse, death. All questions were addressed. Maximal Sterile Barrier Technique was utilized including caps, mask, sterile gowns, sterile gloves, sterile drape, hand hygiene and skin antiseptic. A timeout was performed prior to the initiation of the procedure. Ultrasound survey of the right inguinal region was performed with images stored and sent to PACs. A single wall puncture needle was used access the right common femoral artery under ultrasound guidance with saline syringe. With venous blood flow returned, a Bentson wire was observed to enter the common femoral vein and the IVC under fluoroscopy. The needle was removed, and a small incision was made, and 9 [place] dilator was passed over the Bentson wire. A 10 French TIPS sheath was then passed  over the Bentson wire after manually creating a significant leftward current on the catheter. The inner dilator and wire were removed, and a 5 [place] cobra catheter was passed over the Bentson wire into the IVC. Cobra catheter was used to select the left renal vein orifice. Combination of the Bentson wire, Glidewire, Rosen wire were used to gain purchased into the renal vein, with renal vein limited venogram performed to identify the in flowing gastro renal shunt. With a safety curve on a stiff Amplatz wire position into superior pole renal vein, the TIPS sheath was advanced into the left renal vein over the Cobra catheter. Pullback reinforced straight sheath selection technique (PRESSS) was performed, and as the tip sheath was withdrawn, contrast was used to identified the inflow of the gastro renal shunt. With the coaxial Amplatz wire in position, a combination of a standard Kumpe the catheter and a 4 French angled glide catheter were used to cannulate the orifice of the gastro renal shunt, and 11 o'clock position from the in flowing shunt opposite the adrenal vein inflow. Once the Glidewire was advanced into the shunt, the glide catheter was advanced, and the Glidewire was exchanged for a stiff Amplatz wire with a safety curve on the tip. The renal vein buddy wire was withdrawn, and the TIPS sheath was advanced into the gastro renal shunt. The glide catheter was withdrawn and then a balloon occlusion catheter was advanced over the Amplatz wire into the gastro renal shunt. Amplatz wire was then withdrawn. Angiogram was then performed to confirmed location within the gastro renal shunt outflow. Balloon was inflated on the balloon occlusion catheter and was withdrawn to the first encountered web to achieve hemostasis. Standard angiogram was performed.  CO2 angiogram was also performed. A penumbra Lantern microcatheter and a fathom wire were navigated into the shunt. Once we confirmed location within the outflow with  standard angiogram, treatment was initiated. A foamed solution of detergent/sclerosant was mixed in a standard Tessari method, with 1:2:3 ratio of ethiodized oil: sodium tetradecyl sulfate: Room air. The sclerosed sent was infused under fluoroscopic guidance, with observation of reflux into the afferent veins. Once reflux was identified into the posterior gastric vein, sclerosing was completed. The micro catheter was withdrawn to the tip of the balloon occlusion catheter, and coil mass was deposited with deployment of multiple Ruby coils. 4 standard Ruby coils and 1 soft ruby coil deployed. After 20 minutes, the balloon was deflated under fluoroscopy to observe the foam mass. Once we were confident there was no migration, balloon was withdrawn and the sheath was withdrawn. Hemostasis was achieved with manual compression. Patient remained hemodynamically stable throughout. No complications were encountered and no significant blood loss was encountered. IMPRESSION: Status  post coil assisted balloon occluded retrograde trans venous obliteration (BRTO) for treatment of bleeding gastric varices. Signed, Dulcy Fanny. Earleen Newport, DO [facility] Radiology Electronically Signed   By: Corrie Mckusick D.O.   On: 04/13/2016 15:06   Ct Abd Wo & W Cm  04/14/2016  CLINICAL DATA:  60 year old female with a history of hemorrhaging gastric varices an low risk esophageal varices. 24 hours status post coil assisted balloon retrograde trans venous obliteration. EXAM: CT ABDOMEN WITH CONTRAST TECHNIQUE: Multidetector CT imaging of the abdomen was performed using the standard protocol following bolus administration of intravenous contrast. CONTRAST:  1 ISOVUE-300 IOPAMIDOL (ISOVUE-300) INJECTION 61% COMPARISON:  CT 04/11/2016 FINDINGS: Lower chest: Surgical changes of right mastectomy. Heart size within normal limits.  No pericardial fluid/thickening. No lower mediastinal adenopathy. Unremarkable  appearance of the distal esophagus. No hiatal hernia. Trace bilateral pleural effusions with associated minimal atelectasis. No confluent airspace disease. Abdomen: Similar appearance of the liver 2 prior, with benign cystic lesion in segment 6. As was noted on comparison CT, there is no significant nodularity or enlargement of segment 1, as usually seen with imaging diagnosis of cirrhosis. Cranial caudal span of the spleen measures approximately 14 cm. Unremarkable appearance of the adrenal glands. Contracted gallbladder. Unremarkable appearance of the pancreas. Unremarkable bilateral kidneys. Unremarkable visualized course of the bilateral ureters. Small volume of ascitic fluid. Unremarkable appearance of stomach, and visualized small bowel. Diverticular disease of the colon again visualized. No abnormally distended small bowel or colon. Small bowel, stomach, and colonic wall unremarkable. Post interventional changes of gastric variceal treatment with radio-opaque sclerosant filling the previous GV network. No residual gastric varices visualized on the study. Similar appearance of esophageal varices in the lower esophagus. The most proximal aspect of the gastro renal shunt has thrombosed, with trace thrombus extending along the roof of the left renal vein to the ostium with the IVC. Left renal vein remains widely patent. There is trace sclerosing agent within distal hepatic veins of the left and right liver. Widely patent portal system, including portal vein, splenic vein, superior mesenteric vein, inferior mesenteric vein, and contributing inflow. Posterior gastric vein and short gastric veins have been treated, with no further contribution to the varices network. Unremarkable appearance of the abdominal aorta with minimal atherosclerotic changes. Patency of celiac artery, superior mesenteric artery, and inferior mesenteric artery maintained. Bilateral renal arteries maintained. Similar configuration of  compression fracture of L1. Degenerative changes of the visualized spine, unchanged from prior. IMPRESSION: Status post BRTO, with complete obliteration of Type B gastric variceal network and no further contributing inflow. Trace ascites, potentially reactive status post treatment. Thrombosis of the spleno-gastro-renal shunt, with tiny amount of thrombus extending from the shunt inflow along roof of the left renal vein, with wide patency of left renal vein outflow maintained. IVC widely patent. Trace radiopaque sclerosant within distal portal vein branches of the bilateral liver lobes. Portal system patency is maintained. Splenomegaly, compatible with portal venous hypertension. Signed, Dulcy Fanny. Earleen Newport, DO [facility] Radiology Electronically Signed   By: Corrie Mckusick D.O.   On: 04/14/2016 14:40   [place] Guide Roadmapping  04/13/2016  CLINICAL DATA:  60 year old female with a history of bleeding gastric varices. She has no significant ascites and low risk esophageal varices. Balloon occluded retrograde trans venous obliteration is indicated as the solution for bleeding gastric varices. EXAM: IR EMBO ART VEN HEMORR LYMPH EXTRAV INC GUIDE  ROADMAPPING; ADDITIONAL ARTERIOGRAPHY; IR ULTRASOUND GUIDANCE VASC ACCESS RIGHT; LEFT RENAL VENOGRAPHY; ARTERIOGRAPHY MEDICATIONS: No additional ANESTHESIA/SEDATION: General - as administered by the Anesthesia department CONTRAST:  1 ISOVUE-300 IOPAMIDOL (ISOVUE-300) INJECTION 61% FLUOROSCOPY TIME:  Fluoroscopy Time: 49 minutes 0 seconds COMPLICATIONS: None PROCEDURE: Informed written consent was obtained from the patient and the patient's family after a thorough discussion of the procedural risks, benefits and alternatives. Specific risks that were addressed regarding BRTO include, bleeding, infection, contrast reaction, kidney injury, need for further procedure or surgery including TIPS and/ or  endoscopy, pulmonary embolism, stroke, worsening of ascites, worsening of esophageal varices, paradoxical liver failure, varix rupture, cardiopulmonary collapse, death. All questions were addressed. Maximal Sterile Barrier Technique was utilized including caps, mask, sterile gowns, sterile gloves, sterile drape, hand hygiene and skin antiseptic. A timeout was performed prior to the initiation of the procedure. Ultrasound survey of the right inguinal region was performed with images stored and sent to PACs. A single wall puncture needle was used access the right common femoral artery under ultrasound guidance with saline syringe. With venous blood flow returned, a Bentson wire was observed to enter the common femoral vein and the IVC under fluoroscopy. The needle was removed, and a small incision was made, and 9 [place] dilator was passed over the Bentson wire. A 10 French TIPS sheath was then passed over the Bentson wire after manually creating a significant leftward current on the catheter. The inner dilator and wire were removed, and a 5 [place] cobra catheter was passed over the Bentson wire into the IVC. Cobra catheter was used to select the left renal vein orifice. Combination of the Bentson wire, Glidewire, Rosen wire were used to gain purchased into the renal vein, with renal vein limited venogram performed to identify the in flowing gastro renal shunt. With a safety curve on a stiff Amplatz wire position into superior pole renal vein, the TIPS sheath was advanced into the left renal vein over the Cobra catheter. Pullback reinforced straight sheath selection technique (PRESSS) was performed, and as the tip sheath was withdrawn, contrast was used to identified the inflow of the gastro renal shunt. With the coaxial Amplatz wire in position, a combination of a standard Kumpe the catheter and a 4 French angled glide catheter were used to cannulate the orifice of the gastro renal shunt, and 11 o'clock position from  the in flowing shunt opposite the adrenal vein inflow. Once the Glidewire was advanced into the shunt, the glide catheter was advanced, and the Glidewire was exchanged for a stiff Amplatz wire with a safety curve on the tip. The renal vein buddy wire was withdrawn, and the TIPS sheath was advanced into the gastro renal shunt. The glide catheter was withdrawn and then a balloon occlusion catheter was advanced over the Amplatz wire into the gastro renal shunt. Amplatz wire was then withdrawn. Angiogram was then performed to confirmed location within the gastro renal shunt outflow. Balloon was inflated on the balloon occlusion catheter and was withdrawn to the first encountered web to achieve hemostasis. Standard angiogram was performed.  CO2 angiogram was also performed. A penumbra Lantern microcatheter and a fathom wire were navigated into the shunt. Once we confirmed location within the outflow with standard angiogram, treatment was initiated. A foamed solution of detergent/sclerosant was mixed in a standard Tessari method, with 1:2:3 ratio of ethiodized oil: sodium tetradecyl sulfate: Room air. The sclerosed sent was infused under fluoroscopic guidance, with observation of reflux into the afferent veins. Once reflux was identified  into the posterior gastric vein, sclerosing was completed. The micro catheter was withdrawn to the tip of the balloon occlusion catheter, and coil mass was deposited with deployment of multiple Ruby coils. 4 standard Ruby coils and 1 soft ruby coil deployed. After 20 minutes, the balloon was deflated under fluoroscopy to observe the foam mass. Once we were confident there was no migration, balloon was withdrawn and the sheath was withdrawn. Hemostasis was achieved with manual compression. Patient remained hemodynamically stable throughout. No complications were encountered and no significant blood loss was encountered. IMPRESSION: Status post coil assisted balloon occluded retrograde  trans venous obliteration (BRTO) for treatment of bleeding gastric varices. Signed, Dulcy Fanny. Earleen Newport, DO [facility] Radiology Electronically Signed   By: Corrie Mckusick D.O.   On: 04/13/2016 15:06    ASSESSMENT: 60 y.o. [place] woman presenting June 2011 with stage IV breast cancer involving the right breast, right axilla, mediastinal lymph nodes, and both lungs, but not the brain, liver, or bones  (1) positive right breast and right axillary lymph node biopsies 07/17/2010 of a clinical T4 N2 M1 invasive ductal carcinoma, grade 1, estrogen receptor 100% positive, progesterone receptor 6% positive, with an MIB-1 of 12%, and no HER-2 amplification.  (2) participated in Phase II tessetaxel study, receiving 2 cycles complicated by thrombocytopenia, anemia requiring transfusion, transaminase elevation, and afebrile neutropenia. Off-study as of September 2011. Chest CT scan September 2011 did show evidence of response.  (3) on letrozole as of October 2011, with continuing response and good tolerance   (4) 06/02/2012 underwent right mastectomy and axillary lymph node sampling (3 lymph nodes removed, all with viable cancer as well as evidence of treatment effect) for a ypT2 ypN1-2 invasive ductal carcinoma, grade 1, 98% estrogen receptor positive, progesterone receptor negative, with no HER-2 amplification  (5) left jugular vein DVT documented 06/16/2012; left sided Port-A-Cath removed mid July 2015; Coumadin stopped 09/07/2014.  (a) Left brachiocephalic v. slightly narrowed, with some Left lower neck collateralization noted on chest CT December 2015  (6) osteoporosis, on alendronate (most recent bone density in September 2014 showed a T score of -2.8)   (a) Bone density scan from November 2016 shows worsening T-score of -3.9. Alendronate discontinued. Prolia every 6 months started May 2017  (7) chronic moderate thrombocytopenia  (8) mild persistent  leukopenia   PLAN:  Nicole [name] is delighted to hear her PET scan is clear of any evidence of metastatic diease. She is tolerating the letrozole well and will continue this until there is disease progression. We reviewed the result of her bone density scan, and unfortunately this is only progressing into further osteopenia. She admits to missing a few doses of alendronate, and on top of that she may not even be absorbing the drug well. This was reviewed with Dr. Jana Hakim. He suggests discontinuing the alendronate and moving on to prolia every 6 months. Potential side effects and toxicities were reviewed with the patient. She will begin this next week. We will recheck a bone density scan in 2 years.   I have sent in a prescription for valtrex 563m daily to clear her cold sore.   KRaegynwill return in 6 months for follow up with Dr. MJana Hakim Prior to this visit she will have a chest CT. She understands and agrees with this plan. She knows the goal of treatment in her case is control. She has been encouraged to call with any issues that might arise before her next visit here.  Genelle Gather Boelter    05/09/2016

## 2016-05-10 LAB — CBC
HCT: 34.8 % — ABNORMAL LOW (ref 35.0–45.0)
Hemoglobin: 10.9 g/dL — ABNORMAL LOW (ref 11.7–15.5)
MCH: 28.8 pg (ref 27.0–33.0)
MCHC: 31.3 g/dL — ABNORMAL LOW (ref 32.0–36.0)
MCV: 92.1 fL (ref 80.0–100.0)
MPV: 11.5 fL (ref 7.5–12.5)
PLATELETS: 98 10*3/uL — AB (ref 140–400)
RBC: 3.78 MIL/uL — AB (ref 3.80–5.10)
RDW: 18.4 % — ABNORMAL HIGH (ref 11.0–15.0)
WBC: 3.8 10*3/uL (ref 3.8–10.8)

## 2016-05-10 LAB — COMPLETE METABOLIC PANEL WITH GFR
ALT: 18 U/L (ref 6–29)
AST: 22 U/L (ref 10–35)
Albumin: 4.1 g/dL (ref 3.6–5.1)
Alkaline Phosphatase: 65 U/L (ref 33–130)
BILIRUBIN TOTAL: 0.6 mg/dL (ref 0.2–1.2)
BUN: 10 mg/dL (ref 7–25)
CO2: 25 mmol/L (ref 20–31)
Calcium: 8.8 mg/dL (ref 8.6–10.4)
Chloride: 110 mmol/L (ref 98–110)
Creat: 1.12 mg/dL — ABNORMAL HIGH (ref 0.50–0.99)
GFR, EST AFRICAN AMERICAN: 61 mL/min (ref >=60)
GFR, EST NON AFRICAN AMERICAN: 53 mL/min — AB (ref >=60)
Glucose, Bld: 96 mg/dL (ref 65–99)
Potassium: 4.9 mmol/L (ref 3.5–5.3)
Sodium: 144 mmol/L (ref 135–146)
TOTAL PROTEIN: 6.4 g/dL (ref 6.1–8.1)

## 2016-05-15 ENCOUNTER — Ambulatory Visit
Admission: RE | Admit: 2016-05-15 | Discharge: 2016-05-15 | Disposition: A | Discharge status: Home / Self Care | Payer: BLUE CROSS/BLUE SHIELD | Source: Ambulatory Visit | Attending: General Surgery | Admitting: General Surgery

## 2016-05-15 ENCOUNTER — Ambulatory Visit (HOSPITAL_COMMUNITY)
Admission: RE | Admit: 2016-05-15 | Discharge: 2016-05-15 | Disposition: A | Discharge status: Home / Self Care | Payer: BLUE CROSS/BLUE SHIELD | Source: Ambulatory Visit | Attending: Interventional Radiology | Admitting: Interventional Radiology

## 2016-05-15 ENCOUNTER — Encounter (HOSPITAL_COMMUNITY): Payer: Self-pay

## 2016-05-15 DIAGNOSIS — R161 Splenomegaly, not elsewhere classified: Secondary | ICD-10-CM | POA: Diagnosis not present

## 2016-05-15 DIAGNOSIS — R188 Other ascites: Secondary | ICD-10-CM | POA: Diagnosis not present

## 2016-05-15 DIAGNOSIS — I864 Gastric varices: Secondary | ICD-10-CM

## 2016-05-15 DIAGNOSIS — I8500 Esophageal varices without bleeding: Secondary | ICD-10-CM | POA: Insufficient documentation

## 2016-05-15 HISTORY — PX: IR GENERIC HISTORICAL: IMG1180011

## 2016-05-15 MED ORDER — IOPAMIDOL (ISOVUE-300) INJECTION 61%
100.0000 mL | Freq: Once | INTRAVENOUS | Status: AC | PRN
  Administered 2016-05-15: 100 mL via INTRAVENOUS

## 2016-05-15 NOTE — Progress Notes (Signed)
Chief Complaint:   Portal HTN, SP Balloon Occluded Retrograde Transvenous Obliteration (BRTO) for bleeding of Gastric Varices  Referring Physician(s): Dr Watt Climes  History of Present Illness: Nicole [name] is a 60 y.o. female presenting as a scheduled follow up to Vascular and Interventional Radiology clinic today, SP BRTO on 04/13/2016 for bleeding gastric varices.  She is here with her husband for follow up.   She was admitted on 04/11/2016 with lower GI bleeding, with discovery of gastric varices on CT imaging and endoscopy.  Her Hgb dropped at her initial hospitalization from 14 to 8.    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    A BRTO was performed on 04/13/2016 for her GV's via the RCFV, with approach through the spontaneous gastro-spleno-renal shunt.    She was discharged home after recovery on 04/15/2016.    She tells me that she is feeling much better over the past month.  She was diagnosed with a UTI on her D/C, probably catheter-related, for which she was treated with outpatient ABX for 1 week.  She has no current symptoms.  She has had no further bleeding.  No hematemesis.   She tells me she is back to her usual activity, and is able to handle all of her daily affairs and tasks.    Although her Hgb is 10.9, it has been trending up, and she is no longer feeling fatigue that was present on her most recent admission.  She has a normal WBC, her Tbili is 0.6, and her AST/ALT are normal (labs 05/09/2016).  Cr is slightly elevated 1.12, just above her last admission Cr of 0.82.    Dr. Laurence Spates is her usual GI physician, and Dr. Watt Climes was the GI caring for her on her admission.     Past Medical History  Diagnosis Date  . IBD (inflammatory bowel disease)     resolved  . Heart murmur     mild, no cardiologist  . Blood transfusion 2012  . Radiation 07/21/12-09/07/12    5940 cGy  . Breast CA ([place])     breast  ca dx 06/2010, stage 4, right     Past Surgical History  Procedure Laterality Date  . Portacath placement  2011    left side  . Dilation and curettage of uterus  2004?  [name] Mastectomy  06/02/12    right breast  . Port-a-cath removal    . Esophagogastroduodenoscopy (egd) with propofol N/A 04/11/2016    Procedure: ESOPHAGOGASTRODUODENOSCOPY (EGD) WITH PROPOFOL;  Surgeon: Clarene Essex, MD;  Location: [facility];  Service: Endoscopy;  Laterality: N/A;  . Radiology with anesthesia N/A 04/13/2016    Procedure: RADIOLOGY WITH ANESTHESIA;  Surgeon: Corrie Mckusick, DO;  Location: [place];  Service: Anesthesiology;  Laterality: N/A;    Allergies: Review of patient's allergies indicates no known allergies.  Medications: Prior to Admission medications   Medication Sig Start Date End Date Taking? Authorizing Provider  alendronate (FOSAMAX) 70 MG tablet TAKE 1 TABLET BY MOUTH ONCE A WEEK 08/22/15  Yes Chauncey Cruel, MD  Calcium Carbonate (CALTRATE 600 PO) Take by mouth daily.    Yes Historical Provider, MD  Cholecalciferol (VITAMIN D PO) Take 5,000 Units by mouth daily.    Yes Historical Provider, MD  letrozole (FEMARA) 2.5 MG tablet Take 1 tablet (2.5 mg total) by mouth daily. 11/01/15  Yes Chauncey Cruel, MD  valACYclovir (VALTREX) 500 MG tablet Take 1  tablet (500 mg total) by mouth 2 (two) times daily. 05/09/16  Yes Laurie Panda, NP  venlafaxine XR (EFFEXOR XR) 150 MG 24 hr capsule Take 1 capsule (150 mg total) by mouth daily with breakfast. 04/29/16  Yes Susanne Borders, NP     Family History  Problem Relation Age of Onset  . Cancer Neg Hx   . COPD Mother   . AAA (abdominal aortic aneurysm) Mother   . COPD Father     Social History   Social History  . Marital Status: Married    Spouse Name: N/A  . Number of Children: N/A  . Years of Education: N/A   Social History Main Topics  . Smoking status: Never Smoker   . Smokeless tobacco: Never Used  . Alcohol Use: No  . Drug Use: No  .  Sexual Activity: Yes   Other Topics Concern  . Not on file   Social History Narrative   Breast CA history, treated.  ECOG Status: 0 - Asymptomatic  Review of Systems: A 12 point ROS discussed and pertinent positives are indicated in the HPI above.  All other systems are negative.  Review of Systems  Vital Signs: BP 123/90 mmHg  Pulse 93  Temp(Src) 98.3 F (36.8 C) (Oral)  Resp 14  Ht 5\' 4"  (1.626 m)  Wt 139 lb (63.05 kg)  BMI 23.85 kg/m2  SpO2 100%  Physical Exam  Abdomen is soft and non-tender.    Imaging: Nm Pet Image Restag (ps) Skull Base To Thigh  05/02/2016  CLINICAL DATA:  Subsequent treatment strategy for restaging of right-sided breast cancer. EXAM: NUCLEAR MEDICINE PET SKULL BASE TO THIGH TECHNIQUE: 6.6 mCi F-18 FDG was injected intravenously. Full-ring PET imaging was performed from the skull base to thigh after the radiotracer. CT data was obtained and used for attenuation correction and anatomic localization. FASTING BLOOD GLUCOSE:  Value: 95 mg/dl COMPARISON:  Abdominal CT of 04/16/20167. Chest CT 10/02/2015. Prior PET 02/12/2012 FINDINGS: NECK No areas of abnormal hypermetabolism. CHEST No pulmonary parenchymal hypermetabolism. A low right paratracheal node measures 8 mm and a S.U.V. max of 4.7. On the prior PET, this was similar in size and measured a S.U.V. max of 2.3. Right hilar hypermetabolism without well-defined adenopathy. This measures a S.U.V. max of 6.38 today versus a S.U.V. max of 4.1 on the prior. ABDOMEN/PELVIS No abdominal pelvic nodal hypermetabolism. A focus of hypermetabolism about the skin of the right groin measures a S.U.V. max of 2.0 and is without well-defined CT correlate. Example image 175/series 4. SKELETON No abnormal marrow activity. CT IMAGES PERFORMED FOR ATTENUATION CORRECTION Left carotid atherosclerosis No cervical adenopathy. Right mastectomy and axillary node dissection. Tiny hiatal hernia. Suspicion of patchy radiation fibrosis  throughout the right lung. 4 mm left lower lobe pulmonary nodule on image 34/series 8 is likely similar on 10/02/2015 diagnostic CT. Subtle left upper lobe nodularity is favored to be post infectious or inflammatory and grossly similar to on the prior exam. Abdominal pelvic findings deferred to recent diagnostic CT. Cirrhosis. Status post BRTO. Splenomegaly. Resolved ascites. Colonic stool burden suggests constipation. Probable punctate right renal collecting system calculus. Hysterectomy. Chronic compression deformity involving the L1 vertebral body. IMPRESSION: 1. Hypermetabolism involving a small right paratracheal node and within the right hilum (without nodal correlate). Appearance is indeterminate. Given absence of adenopathy, favored to be reactive. Isolated, somewhat atypical appearance of breast cancer metastasis could look similar. 2. Otherwise, no evidence of metastatic disease. 3. Left upper and less so  left lower lobe pulmonary nodularity is favored to be similar to 10/02/2015 and can be re-evaluated at follow-up. These areas are below PET resolution. 4. Right groin skin hypermetabolism without CT correlate. Consider physical exam correlation. Electronically Signed   By: Abigail Miyamoto M.D.   On: 05/02/2016 09:45    Labs:  CBC:  Recent Labs  04/15/16 0936 04/29/16 1118 05/02/16 0929 05/09/16 1104  WBC 5.4 6.2 6.3 3.8  HGB 10.1* 10.4* 10.5* 10.9*  HCT 30.5* 31.7* 32.2* 34.8*  PLT 63* 156 158 98*    COAGS:  Recent Labs  04/12/16 0959  INR 1.06    BMP:  Recent Labs  04/12/16 0215 04/29/16 1118 05/02/16 0929 05/09/16 1104  NA 140 140 141 144  K 3.6 4.1 4.5 4.9  CL 112*  --   --  110  CO2 23 21* 22 25  GLUCOSE 100* 93 95 96  BUN 17 17.6 16.9 10  CALCIUM 7.6* 9.6 9.2 8.8  CREATININE 0.82 1.1 1.1 1.12*  GFRNONAA >60  --   --  53*  GFRAA >60  --   --  61    LIVER FUNCTION TESTS:  Recent Labs  04/11/16 0834 04/29/16 1118 05/02/16 0929 05/09/16 1104  BILITOT  0.78 0.71 0.53 0.6  AST 20 18 18 22   ALT 18 19 16 18   ALKPHOS 40 76 76 65  PROT 5.7* 7.5 7.1 6.4  ALBUMIN 3.3* 3.8 3.8 4.1    TUMOR MARKERS: No results for input(s): AFPTM, CEA, CA199, CHROMGRNA in the last 8760 hours.  Assessment and Plan:  Ms Celli is a 60 yo female, who is recently admitted to [facility] hospital for acute GI bleeding from gastric varices, and is status post BRTO for therapy.    She has recovered well after her procedure, with hgb now 10.9 on 05/09/2016, normal liver enzymes and Tbili, and resolution of ascites on CT performed today.    At this point for the first 12 months post-treatment, would recommend a staggered schedule for surveillance of esophageal/gastric varices, with 33mo intervals of alternating endoscopy and CT.  She has a CT performed 05/15/2016 showing obliteration of the GV's after sclerotherapy, and no concerning findings at the esophagus.  It would be reasonable for endoscopy in August, and then again BRTO-protocol CT in November.   We will schedule a follow up appointment with her at the time of her CT in November.   I answered all of her questions and she understands she may contact us for any questions/concerns in the future.    Thank you for this interesting consult.  I greatly enjoyed meeting Nicole [name] and look forward to participating in their care.  A copy of this report was sent to the requesting provider on this date.  Electronically Signed: Corrie Mckusick 05/15/2016, 11:18 AM    I spent a total of    25 Minutes in face to face in clinical consultation, greater than 50% of which was counseling/coordinating care for bleeding gastric varices, portal HTN, status post treatment of bleeding gastric varices with sclerotherapy & BRTO.

## 2016-05-16 ENCOUNTER — Ambulatory Visit: Payer: BLUE CROSS/BLUE SHIELD

## 2016-06-25 DIAGNOSIS — F329 Major depressive disorder, single episode, unspecified: Secondary | ICD-10-CM | POA: Diagnosis not present

## 2016-06-25 DIAGNOSIS — C50911 Malignant neoplasm of unspecified site of right female breast: Secondary | ICD-10-CM | POA: Diagnosis not present

## 2016-07-05 ENCOUNTER — Encounter (HOSPITAL_COMMUNITY): Payer: Self-pay | Admitting: *Deleted

## 2016-07-05 ENCOUNTER — Other Ambulatory Visit: Payer: Self-pay | Admitting: Gastroenterology

## 2016-07-08 ENCOUNTER — Ambulatory Visit (HOSPITAL_COMMUNITY): Payer: BLUE CROSS/BLUE SHIELD | Admitting: Certified Registered Nurse Anesthetist

## 2016-07-08 ENCOUNTER — Ambulatory Visit (HOSPITAL_COMMUNITY)
Admission: RE | Admit: 2016-07-08 | Discharge: 2016-07-08 | Disposition: A | Discharge status: Home / Self Care | Payer: BLUE CROSS/BLUE SHIELD | Source: Ambulatory Visit | Attending: Gastroenterology | Admitting: Gastroenterology

## 2016-07-08 ENCOUNTER — Encounter (HOSPITAL_COMMUNITY): Payer: Self-pay | Admitting: Anesthesiology

## 2016-07-08 ENCOUNTER — Encounter (HOSPITAL_COMMUNITY)
Admission: RE | Disposition: A | Discharge status: Home / Self Care | Payer: Self-pay | Source: Ambulatory Visit | Attending: Gastroenterology

## 2016-07-08 DIAGNOSIS — F329 Major depressive disorder, single episode, unspecified: Secondary | ICD-10-CM | POA: Insufficient documentation

## 2016-07-08 DIAGNOSIS — I8500 Esophageal varices without bleeding: Secondary | ICD-10-CM | POA: Diagnosis not present

## 2016-07-08 DIAGNOSIS — Z9221 Personal history of antineoplastic chemotherapy: Secondary | ICD-10-CM | POA: Diagnosis not present

## 2016-07-08 DIAGNOSIS — M810 Age-related osteoporosis without current pathological fracture: Secondary | ICD-10-CM | POA: Insufficient documentation

## 2016-07-08 DIAGNOSIS — Z7983 Long term (current) use of bisphosphonates: Secondary | ICD-10-CM | POA: Insufficient documentation

## 2016-07-08 DIAGNOSIS — Z86718 Personal history of other venous thrombosis and embolism: Secondary | ICD-10-CM | POA: Insufficient documentation

## 2016-07-08 DIAGNOSIS — Z79899 Other long term (current) drug therapy: Secondary | ICD-10-CM | POA: Insufficient documentation

## 2016-07-08 DIAGNOSIS — Z923 Personal history of irradiation: Secondary | ICD-10-CM | POA: Insufficient documentation

## 2016-07-08 DIAGNOSIS — C7800 Secondary malignant neoplasm of unspecified lung: Secondary | ICD-10-CM | POA: Insufficient documentation

## 2016-07-08 DIAGNOSIS — Z79811 Long term (current) use of aromatase inhibitors: Secondary | ICD-10-CM | POA: Diagnosis not present

## 2016-07-08 DIAGNOSIS — Z853 Personal history of malignant neoplasm of breast: Secondary | ICD-10-CM | POA: Insufficient documentation

## 2016-07-08 HISTORY — DX: Esophageal varices without bleeding: I85.00

## 2016-07-08 HISTORY — DX: Personal history of urinary calculi: Z87.442

## 2016-07-08 HISTORY — DX: Headache: R51

## 2016-07-08 HISTORY — DX: Acute embolism and thrombosis of unspecified vein: I82.90

## 2016-07-08 HISTORY — DX: Peripheral vascular disease, unspecified: I73.9

## 2016-07-08 HISTORY — DX: Headache, unspecified: R51.9

## 2016-07-08 HISTORY — DX: Depression, unspecified: F32.A

## 2016-07-08 HISTORY — PX: ESOPHAGOGASTRODUODENOSCOPY (EGD) WITH PROPOFOL: SHX5813

## 2016-07-08 HISTORY — DX: Major depressive disorder, single episode, unspecified: F32.9

## 2016-07-08 SURGERY — ESOPHAGOGASTRODUODENOSCOPY (EGD) WITH PROPOFOL
Anesthesia: Monitor Anesthesia Care

## 2016-07-08 MED ORDER — BUTAMBEN-TETRACAINE-BENZOCAINE 2-2-14 % EX AERO
INHALATION_SPRAY | CUTANEOUS | Status: DC | PRN
  Administered 2016-07-08: 2 via TOPICAL

## 2016-07-08 MED ORDER — PROPOFOL 500 MG/50ML IV EMUL
INTRAVENOUS | Status: DC | PRN
  Administered 2016-07-08: 125 ug/kg/min via INTRAVENOUS

## 2016-07-08 MED ORDER — SODIUM CHLORIDE 0.9 % IV SOLN: INTRAVENOUS | Status: DC

## 2016-07-08 MED ORDER — ONDANSETRON HCL 4 MG/2ML IJ SOLN: 4.0000 mg | Freq: Once | INTRAMUSCULAR | Status: DC | PRN

## 2016-07-08 MED ORDER — HYDROMORPHONE HCL 1 MG/ML IJ SOLN: 0.5000 mg | INTRAMUSCULAR | Status: DC | PRN

## 2016-07-08 MED ORDER — PROPOFOL 10 MG/ML IV BOLUS
INTRAVENOUS | Status: DC | PRN
  Administered 2016-07-08: 25 mg via INTRAVENOUS
  Administered 2016-07-08: 50 mg via INTRAVENOUS
  Administered 2016-07-08: 25 mg via INTRAVENOUS

## 2016-07-08 MED ORDER — LACTATED RINGERS IV SOLN
INTRAVENOUS | Status: DC
  Administered 2016-07-08: 10:00:00 via INTRAVENOUS

## 2016-07-08 NOTE — Anesthesia Postprocedure Evaluation (Signed)
Anesthesia Post Note  Patient: Nicole [name]  Procedure(s) Performed: Procedure(s) (LRB): ESOPHAGOGASTRODUODENOSCOPY (EGD) WITH PROPOFOL (N/A)  Patient location during evaluation: PACU Anesthesia Type: MAC Level of consciousness: awake, oriented and patient cooperative Pain management: pain level controlled Vital Signs Assessment: post-procedure vital signs reviewed and stable Respiratory status: spontaneous breathing and respiratory function stable Cardiovascular status: blood pressure returned to baseline Anesthetic complications: no    Last Vitals:  Filed Vitals:   07/08/16 0951 07/08/16 1032  BP: 172/84 118/64  Pulse: 95 90  Temp: 36.7 C 36.7 C  Resp: 16 14    Last Pain: There were no vitals filed for this visit.               [name] EDWARD

## 2016-07-08 NOTE — H&P (Signed)
Subjective:   Patient is a 60 y.o. female presents with Need for follow-up varices. She has no known liver disease but has had chemo and radiation therapy using some type experimental chemotherapy that did cause some elevation for liver test several years ago. She developed a G.I. bleed and had what appeared to be bleeding from gastric varices with minimal esophageal varices. CT scan confirmed portal hypertension but did not clearly show cirrhosis. The patient ended up undergoing BRTO procedure by Dr. Earleen Newport. This EGD is done to reevaluate the varices.. Procedure including risks and benefits discussed in office.  Patient Active Problem List   Diagnosis Date Noted  . Osteoporosis 05/09/2016  . Depression 04/30/2016  . UTI (urinary tract infection) 04/30/2016  . Acute upper GI bleed 04/11/2016  . Esophageal varices ([place]) 04/11/2016  . Breast cancer metastasized to lung ([place]) 03/14/2014  . Breast cancer of upper-outer quadrant of right female breast ([place]) 09/30/2013  . Lymphedema of arm 06/14/2013  . DVT (deep venous thrombosis) ([place]) 12/15/2012   Past Medical History  Diagnosis Date  . IBD (inflammatory bowel disease)     resolved  . Heart murmur     mild, no cardiologist  . Blood transfusion 2012  . Radiation 07/21/12-09/07/12    5940 cGy  . Headache     Mirgraine- rare since menopause  . Esophageal varices ([place])   . Depression   . Breast CA ([place])     breast ca dx 06/2010, stage 4, right , Lung Metasis  . History of kidney stones     no pain- shows in CT- small 1 mm bilaterally- kidney  . Peripheral vascular disease ([place])   . Clot     jugular  ?2015    Past Surgical History  Procedure Laterality Date  . Portacath placement  2011    left side  . Dilation and curettage of uterus  2004?  [name] Mastectomy  06/02/12    right breast  . Port-a-cath removal    . Esophagogastroduodenoscopy (egd) with propofol N/A 04/11/2016    Procedure: ESOPHAGOGASTRODUODENOSCOPY (EGD) WITH PROPOFOL;   Surgeon: Clarene Essex, MD;  [facility];  Service: Endoscopy;  Laterality: N/A;  . Radiology with anesthesia N/A 04/13/2016    Procedure: RADIOLOGY WITH ANESTHESIA;  Surgeon: Corrie Mckusick, DO;  Location: [place];  Service: Anesthesiology;  Laterality: N/A;  . Colonoscopy      Prescriptions prior to admission  Medication Sig Dispense Refill Last Dose  . alendronate (FOSAMAX) 70 MG tablet TAKE 1 TABLET BY MOUTH ONCE A WEEK 12 tablet 1 Past Week at Unknown time  . Calcium Carbonate (CALTRATE 600 PO) Take by mouth daily.    07/07/2016 at Unknown time  . Cholecalciferol (VITAMIN D PO) Take 5,000 Units by mouth daily.    07/07/2016 at Unknown time  . letrozole (FEMARA) 2.5 MG tablet Take 1 tablet (2.5 mg total) by mouth daily. 90 tablet 2 07/07/2016 at Unknown time  . venlafaxine XR (EFFEXOR XR) 150 MG 24 hr capsule Take 1 capsule (150 mg total) by mouth daily with breakfast. 30 capsule 1 07/07/2016 at Unknown time  . valACYclovir (VALTREX) 500 MG tablet Take 1 tablet (500 mg total) by mouth 2 (two) times daily. 20 tablet 1 Taking   No Known Allergies  Social History  Substance Use Topics  . Smoking status: Never Smoker   . Smokeless tobacco: Never Used  . Alcohol Use: No    Family History  Problem Relation Age of Onset  . Cancer  Neg Hx   . COPD Mother   . AAA (abdominal aortic aneurysm) Mother   . COPD Father      Objective:   Patient Vitals for the past 8 hrs:  BP Temp Temp src Pulse Resp SpO2 Height Weight  07/08/16 0951 (!) 172/84 mmHg 98 F (36.7 C) Oral 95 16 98 % 5\' 4"  (1.626 m) 61.689 kg (136 lb)         See MD Preop evaluation      Assessment:   1. Gastric and esophageal varices. Status post BRTO treatment of bleeding gastric varices. Procedure done to evaluate for esophageal gastric varices.  Plan:   Will proceed with EGD for evaluation. Have discussed with patient husband the possibility of banding esophageal varices if they are very large and look like they are  at risk of bleeding. Otherwise we'll just do a diagnostic procedure.

## 2016-07-08 NOTE — Anesthesia Preprocedure Evaluation (Signed)
Anesthesia Evaluation  Patient identified by MRN, date of birth, ID band Patient awake    Reviewed: Allergy & Precautions, NPO status , Patient's Chart, lab work & pertinent test results  Airway Mallampati: I  TM Distance: >3 FB     Dental   Pulmonary    Pulmonary exam normal        Cardiovascular + Peripheral Vascular Disease  Normal cardiovascular exam     Neuro/Psych  Headaches,    GI/Hepatic (+) Cirrhosis       ,   Endo/Other    Renal/GU      Musculoskeletal   Abdominal   Peds  Hematology   Anesthesia Other Findings Breast CA w/ mets  Reproductive/Obstetrics                             Anesthesia Physical Anesthesia Plan  ASA: II  Anesthesia Plan: MAC   Post-op Pain Management:    Induction: Intravenous  Airway Management Planned: Mask  Additional Equipment:   Intra-op Plan:   Post-operative Plan:   Informed Consent: I have reviewed the patients History and Physical, chart, labs and discussed the procedure including the risks, benefits and alternatives for the proposed anesthesia with the patient or authorized representative who has indicated his/her understanding and acceptance.     Plan Discussed with: CRNA, Anesthesiologist and Surgeon  Anesthesia Plan Comments:         Anesthesia Quick Evaluation

## 2016-07-08 NOTE — Discharge Instructions (Signed)

## 2016-07-08 NOTE — Transfer of Care (Signed)
Immediate Anesthesia Transfer of Care Note  Patient: Nicole [name]  Procedure(s) Performed: Procedure(s): ESOPHAGOGASTRODUODENOSCOPY (EGD) WITH PROPOFOL (N/A)  Patient Location: PACU and Endoscopy Unit  Anesthesia Type:mac  Level of Consciousness: awake, alert , oriented and patient cooperative  Airway & Oxygen Therapy: Patient Spontanous Breathing and Patient connected to nasal cannula oxygen  Post-op Assessment: Report given to RN and Post -op Vital signs reviewed and stable  Post vital signs: Reviewed and stable  Last Vitals:  Filed Vitals:   07/08/16 0951  BP: 172/84  Pulse: 95  Temp: 36.7 C  Resp: 16    Last Pain: There were no vitals filed for this visit.       Complications: No apparent anesthesia complications

## 2016-07-08 NOTE — Op Note (Signed)
[facility] Patient Name: Nicole [name] Procedure Date : 07/08/2016 MRN: [id number] Attending MD: Nancy Fetter Dr., MD Date of Birth: August 26, 1956 CSN: [id number] Age: 60 Admit Type: Outpatient Procedure:                Upper GI endoscopy Indications:              Follow-up of esophageal varices, Follow-up of                            gastric varices Providers:                Jeneen Rinks L. Brailey Buescher Dr., MD, Kingsley Plan, RN,                            Elspeth Cho, Technician, Tawni Carnes, CRNA Referring MD:             Dr Earleen Newport, Dr Magrinot Medicines:                Propofol total dose [id number] mg IV Complications:            No immediate complications. Estimated Blood Loss:     Estimated blood loss: none. Procedure:                Pre-Anesthesia Assessment:                           - Prior to the procedure, a History and Physical                            was performed, and patient medications and                            allergies were reviewed. The patient's tolerance of                            previous anesthesia was also reviewed. The risks                            and benefits of the procedure and the sedation                            options and risks were discussed with the patient.                            All questions were answered, and informed consent                            was obtained. Prior Anticoagulants: The patient has                            taken no previous anticoagulant or antiplatelet                            agents. ASA Grade Assessment: II - A patient with  mild systemic disease. After reviewing the risks                            and benefits, the patient was deemed in                            satisfactory condition to undergo the procedure.                           After obtaining informed consent, the endoscope was                            passed under direct vision. Throughout the                       procedure, the patient's blood pressure, pulse, and                            oxygen saturations were monitored continuously. The                            [id number]) scope was introduced through the                            mouth, and advanced to the second part of duodenum.                            The upper GI endoscopy was accomplished without                            difficulty. The patient tolerated the procedure                            well. Scope In: Scope Out: Findings:      Grade II varices were found in the middle third of the esophagus and in       the lower third of the esophagus. They were small in size.      There is no endoscopic evidence of mucosal abnormalities, ulceration or       varices in the entire examined stomach.      The examined duodenum was normal. Impression:               - Grade II esophageal varices. these did not appear                            larger than previous EGD and there were no                            worrisome signs of impending bleeding. No                            esophageal banding was done at this time.                           - Normal examined  duodenum.                           - No specimens collected. Moderate Sedation:      See anesthesia note, no moderate sedation. Recommendation:           - Patient has a contact number available for                            emergencies. The signs and symptoms of potential                            delayed complications were discussed with the                            patient. Return to normal activities tomorrow.                            Written discharge instructions were provided to the                            patient.                           - Resume previous diet.                           - Continue present medications.                           - Repeat upper endoscopy in 6 months for                            surveillance.                            - Return to endoscopist in 2 months. Procedure Code(s):        --- Professional ---                           [phone], Esophagogastroduodenoscopy, flexible,                            transoral; diagnostic, including collection of                            specimen(s) by brushing or washing, when performed                            (separate procedure) Diagnosis Code(s):        --- Professional ---                           I85.00, Esophageal varices without bleeding                           I86.4, Gastric varices CPT copyright 2016 [employer] Medical Association. All rights reserved. The codes  documented in this report are preliminary and upon coder review may  be revised to meet current compliance requirements. Nancy Fetter Dr., MD 07/08/2016 10:32:39 AM This report has been signed electronically. Number of Addenda: 0

## 2016-07-29 ENCOUNTER — Other Ambulatory Visit: Payer: Self-pay | Admitting: Nurse Practitioner

## 2016-07-29 DIAGNOSIS — C50411 Malignant neoplasm of upper-outer quadrant of right female breast: Secondary | ICD-10-CM

## 2016-07-30 ENCOUNTER — Other Ambulatory Visit: Payer: Self-pay | Admitting: Oncology

## 2016-07-30 DIAGNOSIS — C50411 Malignant neoplasm of upper-outer quadrant of right female breast: Secondary | ICD-10-CM

## 2016-07-30 NOTE — Telephone Encounter (Signed)
Chart reviewed.

## 2016-08-22 ENCOUNTER — Other Ambulatory Visit: Payer: Self-pay | Admitting: *Deleted

## 2016-08-22 DIAGNOSIS — C50411 Malignant neoplasm of upper-outer quadrant of right female breast: Secondary | ICD-10-CM

## 2016-08-22 MED ORDER — VENLAFAXINE HCL ER 150 MG PO CP24: 150.0000 mg | ORAL_CAPSULE | Freq: Every day | ORAL | 3 refills | Status: DC

## 2016-08-23 ENCOUNTER — Other Ambulatory Visit: Payer: Self-pay | Admitting: Oncology

## 2016-08-23 NOTE — Telephone Encounter (Signed)
Chart reviewed.

## 2016-08-28 ENCOUNTER — Other Ambulatory Visit: Payer: Self-pay | Admitting: *Deleted

## 2016-08-29 ENCOUNTER — Telehealth: Payer: Self-pay | Admitting: *Deleted

## 2016-08-29 DIAGNOSIS — C50911 Malignant neoplasm of unspecified site of right female breast: Secondary | ICD-10-CM

## 2016-08-29 MED ORDER — LETROZOLE 2.5 MG PO TABS
2.5000 mg | ORAL_TABLET | Freq: Every day | ORAL | 0 refills | Status: DC
Start: 2016-08-29 — End: 2016-11-07

## 2016-08-29 NOTE — Telephone Encounter (Signed)
FYI Call received from patient asking for refill on Letrozole.  Reports she is in [place], left her Letrozole and it's about time for a refill anyway.  Refill called to [facility] in [place].

## 2016-09-27 ENCOUNTER — Other Ambulatory Visit: Payer: Self-pay | Admitting: Family Medicine

## 2016-09-27 DIAGNOSIS — Z1231 Encounter for screening mammogram for malignant neoplasm of breast: Secondary | ICD-10-CM

## 2016-10-01 ENCOUNTER — Encounter: Payer: Self-pay | Admitting: Interventional Radiology

## 2016-10-16 ENCOUNTER — Other Ambulatory Visit: Payer: Self-pay | Admitting: Interventional Radiology

## 2016-10-16 DIAGNOSIS — I864 Gastric varices: Secondary | ICD-10-CM

## 2016-11-01 ENCOUNTER — Other Ambulatory Visit: Payer: Self-pay | Admitting: *Deleted

## 2016-11-01 DIAGNOSIS — I864 Gastric varices: Secondary | ICD-10-CM

## 2016-11-06 ENCOUNTER — Other Ambulatory Visit: Payer: Self-pay | Admitting: *Deleted

## 2016-11-06 ENCOUNTER — Ambulatory Visit (HOSPITAL_COMMUNITY)
Admission: RE | Admit: 2016-11-06 | Discharge: 2016-11-06 | Disposition: A | Discharge status: Home / Self Care | Payer: BLUE CROSS/BLUE SHIELD | Source: Ambulatory Visit | Attending: Interventional Radiology | Admitting: Interventional Radiology

## 2016-11-06 ENCOUNTER — Ambulatory Visit
Admission: RE | Admit: 2016-11-06 | Discharge: 2016-11-06 | Disposition: A | Discharge status: Home / Self Care | Payer: BLUE CROSS/BLUE SHIELD | Source: Ambulatory Visit | Attending: Family Medicine | Admitting: Family Medicine

## 2016-11-06 ENCOUNTER — Encounter (HOSPITAL_COMMUNITY): Payer: Self-pay

## 2016-11-06 DIAGNOSIS — Z1231 Encounter for screening mammogram for malignant neoplasm of breast: Secondary | ICD-10-CM | POA: Diagnosis not present

## 2016-11-06 DIAGNOSIS — K766 Portal hypertension: Secondary | ICD-10-CM | POA: Diagnosis not present

## 2016-11-06 DIAGNOSIS — R161 Splenomegaly, not elsewhere classified: Secondary | ICD-10-CM | POA: Insufficient documentation

## 2016-11-06 DIAGNOSIS — C50411 Malignant neoplasm of upper-outer quadrant of right female breast: Secondary | ICD-10-CM

## 2016-11-06 DIAGNOSIS — I864 Gastric varices: Secondary | ICD-10-CM | POA: Diagnosis not present

## 2016-11-06 DIAGNOSIS — I700 Atherosclerosis of aorta: Secondary | ICD-10-CM | POA: Diagnosis not present

## 2016-11-06 DIAGNOSIS — I8500 Esophageal varices without bleeding: Secondary | ICD-10-CM | POA: Insufficient documentation

## 2016-11-06 DIAGNOSIS — N200 Calculus of kidney: Secondary | ICD-10-CM | POA: Diagnosis not present

## 2016-11-06 LAB — POCT I-STAT CREATININE: Creatinine, Ser: 1.1 mg/dL — ABNORMAL HIGH (ref 0.44–1.00)

## 2016-11-06 MED ORDER — IOPAMIDOL (ISOVUE-300) INJECTION 61%
100.0000 mL | Freq: Once | INTRAVENOUS | Status: AC | PRN
  Administered 2016-11-06: 100 mL via INTRAVENOUS

## 2016-11-07 ENCOUNTER — Ambulatory Visit: Payer: BLUE CROSS/BLUE SHIELD

## 2016-11-07 ENCOUNTER — Ambulatory Visit (HOSPITAL_COMMUNITY)
Admission: RE | Admit: 2016-11-07 | Discharge: 2016-11-07 | Disposition: A | Discharge status: Home / Self Care | Payer: BLUE CROSS/BLUE SHIELD | Source: Ambulatory Visit | Attending: Oncology | Admitting: Oncology

## 2016-11-07 ENCOUNTER — Ambulatory Visit (HOSPITAL_BASED_OUTPATIENT_CLINIC_OR_DEPARTMENT_OTHER): Payer: BLUE CROSS/BLUE SHIELD | Admitting: Oncology

## 2016-11-07 ENCOUNTER — Telehealth: Payer: Self-pay | Admitting: Interventional Radiology

## 2016-11-07 ENCOUNTER — Other Ambulatory Visit (HOSPITAL_BASED_OUTPATIENT_CLINIC_OR_DEPARTMENT_OTHER): Payer: BLUE CROSS/BLUE SHIELD

## 2016-11-07 VITALS — BP 147/77 | HR 116 | Temp 98.4°F | Resp 18 | Ht 64.0 in | Wt 138.1 lb

## 2016-11-07 DIAGNOSIS — Z170 Estrogen receptor positive status [ER+]: Secondary | ICD-10-CM

## 2016-11-07 DIAGNOSIS — R5383 Other fatigue: Secondary | ICD-10-CM

## 2016-11-07 DIAGNOSIS — C50411 Malignant neoplasm of upper-outer quadrant of right female breast: Secondary | ICD-10-CM | POA: Insufficient documentation

## 2016-11-07 DIAGNOSIS — C50919 Malignant neoplasm of unspecified site of unspecified female breast: Secondary | ICD-10-CM | POA: Diagnosis not present

## 2016-11-07 DIAGNOSIS — M818 Other osteoporosis without current pathological fracture: Secondary | ICD-10-CM | POA: Diagnosis not present

## 2016-11-07 DIAGNOSIS — M810 Age-related osteoporosis without current pathological fracture: Secondary | ICD-10-CM

## 2016-11-07 LAB — CBC WITH DIFFERENTIAL/PLATELET
BASO%: 0.2 % (ref 0.0–2.0)
BASOS ABS: 0 10*3/uL (ref 0.0–0.1)
EOS ABS: 0 10*3/uL (ref 0.0–0.5)
EOS%: 0 % (ref 0.0–7.0)
HEMATOCRIT: 33.5 % — AB (ref 34.8–46.6)
HGB: 10.1 g/dL — ABNORMAL LOW (ref 11.6–15.9)
LYMPH%: 7.9 % — ABNORMAL LOW (ref 14.0–49.7)
MCH: 20 pg — ABNORMAL LOW (ref 25.1–34.0)
MCHC: 30.1 g/dL — ABNORMAL LOW (ref 31.5–36.0)
MCV: 66.5 fL — AB (ref 79.5–101.0)
MONO#: 0.4 10*3/uL (ref 0.1–0.9)
MONO%: 7.7 % (ref 0.0–14.0)
NEUT%: 84.2 % — ABNORMAL HIGH (ref 38.4–76.8)
NEUTROS ABS: 4.5 10*3/uL (ref 1.5–6.5)
NRBC: 0 % (ref 0–0)
PLATELETS: 90 10*3/uL — AB (ref 145–400)
RBC: 5.04 10*6/uL (ref 3.70–5.45)
RDW: 19.4 % — AB (ref 11.2–14.5)
WBC: 5.4 10*3/uL (ref 3.9–10.3)
lymph#: 0.4 10*3/uL — ABNORMAL LOW (ref 0.9–3.3)

## 2016-11-07 LAB — COMPREHENSIVE METABOLIC PANEL
ALBUMIN: 3.7 g/dL (ref 3.5–5.0)
ALK PHOS: 87 U/L (ref 40–150)
ALT: 25 U/L (ref 0–55)
ANION GAP: 10 meq/L (ref 3–11)
AST: 25 U/L (ref 5–34)
BILIRUBIN TOTAL: 0.67 mg/dL (ref 0.20–1.20)
BUN: 14.9 mg/dL (ref 7.0–26.0)
CALCIUM: 9.2 mg/dL (ref 8.4–10.4)
CO2: 22 mEq/L (ref 22–29)
CREATININE: 1.1 mg/dL (ref 0.6–1.1)
Chloride: 107 mEq/L (ref 98–109)
EGFR: 56 mL/min/{1.73_m2} — AB (ref >90)
Glucose: 99 mg/dl (ref 70–140)
Potassium: 4.1 mEq/L (ref 3.5–5.1)
Sodium: 140 mEq/L (ref 136–145)
TOTAL PROTEIN: 7.1 g/dL (ref 6.4–8.3)

## 2016-11-07 MED ORDER — LETROZOLE 2.5 MG PO TABS: 2.5000 mg | ORAL_TABLET | Freq: Every day | ORAL | 0 refills | Status: DC

## 2016-11-07 MED ORDER — DENOSUMAB 60 MG/ML ~~LOC~~ SOLN
60.0000 mg | Freq: Once | SUBCUTANEOUS | Status: AC
  Filled 2016-11-07: qty 1

## 2016-11-07 NOTE — Patient Instructions (Signed)
Denosumab injection  What is this medicine?  DENOSUMAB (den oh sue mab) slows bone breakdown. Prolia is used to treat osteoporosis in women after menopause and in men. Xgeva is used to prevent bone fractures and other bone problems caused by cancer bone metastases. Xgeva is also used to treat giant cell tumor of the bone.  This medicine may be used for other purposes; ask your health care provider or pharmacist if you have questions.  What should I tell my health care provider before I take this medicine?  They need to know if you have any of these conditions:  -dental disease  -eczema  -infection or history of infections  -kidney disease or on dialysis  -low blood calcium or vitamin D  -malabsorption syndrome  -scheduled to have surgery or tooth extraction  -taking medicine that contains denosumab  -thyroid or parathyroid disease  -an unusual reaction to denosumab, other medicines, foods, dyes, or preservatives  -pregnant or trying to get pregnant  -breast-feeding  How should I use this medicine?  This medicine is for injection under the skin. It is given by a health care professional in a hospital or clinic setting.  If you are getting Prolia, a special MedGuide will be given to you by the pharmacist with each prescription and refill. Be sure to read this information carefully each time.  For Prolia, talk to your pediatrician regarding the use of this medicine in children. Special care may be needed. For Xgeva, talk to your pediatrician regarding the use of this medicine in children. While this drug may be prescribed for children as young as 13 years for selected conditions, precautions do apply.  Overdosage: If you think you have taken too much of this medicine contact a poison control center or emergency room at once.  NOTE: This medicine is only for you. Do not share this medicine with others.  What if I miss a dose?  It is important not to miss your dose. Call your doctor or health care professional if you are  unable to keep an appointment.  What may interact with this medicine?  Do not take this medicine with any of the following medications:  -other medicines containing denosumab  This medicine may also interact with the following medications:  -medicines that suppress the immune system  -medicines that treat cancer  -steroid medicines like prednisone or cortisone  This list may not describe all possible interactions. Give your health care provider a list of all the medicines, herbs, non-prescription drugs, or dietary supplements you use. Also tell them if you smoke, drink alcohol, or use illegal drugs. Some items may interact with your medicine.  What should I watch for while using this medicine?  Visit your doctor or health care professional for regular checks on your progress. Your doctor or health care professional may order blood tests and other tests to see how you are doing.  Call your doctor or health care professional if you get a cold or other infection while receiving this medicine. Do not treat yourself. This medicine may decrease your body's ability to fight infection.  You should make sure you get enough calcium and vitamin D while you are taking this medicine, unless your doctor tells you not to. Discuss the foods you eat and the vitamins you take with your health care professional.  See your dentist regularly. Brush and floss your teeth as directed. Before you have any dental work done, tell your dentist you are receiving this medicine.  Do   not become pregnant while taking this medicine or for 5 months after stopping it. Women should inform their doctor if they wish to become pregnant or think they might be pregnant. There is a potential for serious side effects to an unborn child. Talk to your health care professional or pharmacist for more information.  What side effects may I notice from receiving this medicine?  Side effects that you should report to your doctor or health care professional as soon as  possible:  -allergic reactions like skin rash, itching or hives, swelling of the face, lips, or tongue  -breathing problems  -chest pain  -fast, irregular heartbeat  -feeling faint or lightheaded, falls  -fever, chills, or any other sign of infection  -muscle spasms, tightening, or twitches  -numbness or tingling  -skin blisters or bumps, or is dry, peels, or red  -slow healing or unexplained pain in the mouth or jaw  -unusual bleeding or bruising  Side effects that usually do not require medical attention (Report these to your doctor or health care professional if they continue or are bothersome.):  -muscle pain  -stomach upset, gas  This list may not describe all possible side effects. Call your doctor for medical advice about side effects. You may report side effects to FDA at 1-800-FDA-1088.  Where should I keep my medicine?  This medicine is only given in a clinic, doctor's office, or other health care setting and will not be stored at home.  NOTE: This sheet is a summary. It may not cover all possible information. If you have questions about this medicine, talk to your doctor, pharmacist, or health care provider.      2016, Elsevier/Gold Standard. (2012-06-15 12:37:47)

## 2016-11-07 NOTE — Progress Notes (Signed)
Called cell number for patient.  Was able to leave VM.   Reported that the CT results are good, with no concerning findings.    We will plan on repeat imaging and office appointment at [facility] in 6 months.  Happy to set up appointment before then if needed.    Signed,  Dulcy Fanny. Earleen Newport, DO

## 2016-11-07 NOTE — Progress Notes (Signed)
ID: Nicole [name]   DOB: [birth year]  MR#: [id number]  IFO#:[id number]  PCP:  GYN:  SURolm Bookbinder, MD OTHER MD: Gery Pray, MD  CHIEF COMPLAINT:  Metastatic Breast Cancer  CURRENT TREATMENT: Letrozole, denosumab/Prolia  NTERVAL HISTORY: Nicole [name] returns today for followup of her stage IV breast cancer, accompanied by her husband Merry Proud. As far as her cancer is concerned, she continues on letrozole, with good tolerance. Hot flashes and vaginal dryness are not a major issue. She never developed the arthralgias or myalgias that many patients can experience on this medication. She obtains it at a good price.  She was recently admitted for treatment of GI bleed secondary to gastric varices. She just had a restaging CT scan of the abdomen and pelvis which shows resolution of the varices and also shows some cirrhosis. There is no evidence of cancer. She has been scheduled to see a hepatologist next week.   REVIEW OF SYSTEMS:  Nicole [name] is not exercising regularly. She describes herself is mildly fatigued. She has some sinus problems. Occasionally she has mild palpitations. She has joint pains here and there which are not more intense or persistent than before. She describes herself as forgetful and depressed and she is anxious now because of the liver issue. She has occasional hot flashes. Her teeth are "perfect". A detailed review of systems today was otherwise stable  AST CANCER HISTORY: From Dr. Collier Salina Rubin's 07/25/2010 note:  "This woman has not had any significant medical intervention for some time.  Her last mammogram was about seven years ago.  She noted a right breast mass about a year ago and did not seek immediate medical attention for this.  She ultimately, after some time, admitted this to her husband and self-referred herself for intervention.  She had a mammogram on 07/17/2010 with an ultrasound of the right breast.  This showed a lobulated mass upper right outer quadrant measuring at  least 6.3 x 7.3 cm and large right axillary lymph nodes were also noted.  There was skin thickening overlying the mass.  On physical exam, this mass was about an 8 cm with skin dimpling noted. Discolored area in the skin over the mass, fullness in the axilla.  Biopsy was recommended, which took place on 07/17/2010.  Pathology showed invasive ductal cancer involving both lymph node and breast.  The HER-2 was not amplified.  ER and PR both positive at 100% and 6% respectively.  Proliferative index was 12% involving both the lymph node and breast mass.  An MRI scan of both breasts was performed on 07/22/2010, essentially which showed a large heterogeneously enhancing mass of the right breast with washout kinetics measuring 6.4 x 4.4 x 5.0 cm.  Numerous satellite nodules were seen throughout the dominant mass.  There were several suspicious nodules in the upper central and upper inner quadrant of the right breast.  There were two enhancing subcentimeter nodules seen in the right pectoralis muscle.  No obvious chest wall nodules were seen; however, there was seen some metastatic adenopathy in the mediastinum as well as hila with a 2.4 x 2.0 cm mediastinal lymph node as well as 1.2 x 3.0 cm subcarinal lymph node.  There was a right hilar and probable left hilar adenopathy.  Bilateral pulmonary nodules were seen and a right T2 lesion was seen anterior right liver as well."  Her subsequent history is as detailed below.  PAST MEDICAL HISTORY: Past Medical History:  Diagnosis Date  . Blood transfusion 2012  . Breast  CA ([place])    breast ca dx 06/2010, stage 4, right , Lung Metasis  . Clot    jugular  ?2015  . Depression   . Esophageal varices ([place])   . Headache    Mirgraine- rare since menopause  . Heart murmur    mild, no cardiologist  . History of kidney stones    no pain- shows in CT- small 1 mm bilaterally- kidney  . IBD (inflammatory bowel disease)    resolved  . Peripheral vascular disease ([place])   .  Radiation 07/21/12-09/07/12   5940 cGy    PAST SURGICAL HISTORY: Past Surgical History:  Procedure Laterality Date  . COLONOSCOPY    . DILATION AND CURETTAGE OF UTERUS  2004?  . ESOPHAGOGASTRODUODENOSCOPY (EGD) WITH PROPOFOL N/A 04/11/2016   Procedure: ESOPHAGOGASTRODUODENOSCOPY (EGD) WITH PROPOFOL;  Surgeon: Clarene Essex, MD;  Location: [facility];  Service: Endoscopy;  Laterality: N/A;  . ESOPHAGOGASTRODUODENOSCOPY (EGD) WITH PROPOFOL N/A 07/08/2016   Procedure: ESOPHAGOGASTRODUODENOSCOPY (EGD) WITH PROPOFOL;  Surgeon: Laurence Spates, MD;  Location: [facility];  Service: Endoscopy;  Laterality: N/A;  . IR GENERIC HISTORICAL  05/15/2016   IR RADIOLOGIST EVAL & MGMT 05/15/2016 Corrie Mckusick, DO [facility] INTERV RAD  . MASTECTOMY  06/02/12   right breast  . PORT-A-CATH REMOVAL    . PORTACATH PLACEMENT  2011   left side  . RADIOLOGY WITH ANESTHESIA N/A 04/13/2016   Procedure: RADIOLOGY WITH ANESTHESIA;  Surgeon: Corrie Mckusick, DO;  Location: [facility];  Service: Anesthesiology;  Laterality: N/A;    FAMILY HISTORY Family History  Problem Relation Age of Onset  . Cancer Neg Hx   . COPD Mother   . AAA (abdominal aortic aneurysm) Mother   . COPD Father    patient's father died at the age of 73 from heart disease. Patient's mother died at the age of 79 status post CABG. The patient had no brothers, 2 sisters. There is no history of breast or ovarian cancer in the family.  GYNECOLOGIC HISTORY:  (Updated 03/14/2014)  Menarche age 83, she is GX P0. She does not recall when she went through menopause. She never took hormone replacement.  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 Husband Merry Proud is retired from [employer]. They have no children and no pets. They attend a local [place].   ADVANCED DIRECTIVES: Not in place  HEALTH MAINTENANCE: (updated 03/14/2014)  Social History  Substance Use Topics  . Smoking status:  Never Smoker  . Smokeless tobacco: Never Used  . Alcohol use No     Colonoscopy: Never  PAP: Remote  Bone density: 09/27/2013, osteoporosis with a T score of -2.8  Lipid panel: Not on file   No Known Allergies  Current Outpatient Prescriptions  Medication Sig Dispense Refill  . alendronate (FOSAMAX) 70 MG tablet TAKE 1 TABLET BY MOUTH ONCE A WEEK 12 tablet 1  . alendronate (FOSAMAX) 70 MG tablet TAKE 1 TABLET BY MOUTH ONCE A WEEK 12 tablet 1  . Calcium Carbonate (CALTRATE 600 PO) Take by mouth daily.     . Cholecalciferol (VITAMIN D PO) Take 5,000 Units by mouth daily.     [name] letrozole (FEMARA) 2.5 MG tablet Take 1 tablet (2.5 mg total) by mouth daily. 90 tablet 0  . valACYclovir (VALTREX) 500 MG tablet Take 1 tablet (500 mg total) by mouth 2 (two) times daily. 20 tablet 1  . venlafaxine XR (EFFEXOR-XR) 150 MG 24 hr  capsule Take 1 capsule (150 mg total) by mouth daily with breakfast. 30 capsule 3   No current facility-administered medications for this visit.     OBJECTIVE: Middle-aged white woman in no acute distress  Vitals:   11/07/16 1004  BP: (!) 147/77  Pulse: (!) 116  Resp: 18  Temp: 98.4 F (36.9 C)     Body mass index is 23.7 kg/m.    ECOG FS: 1 Filed Weights   11/07/16 1004  Weight: 138 lb 1.6 oz (62.6 kg)   Sclerae unicteric, EOMs intact Oropharynx clear and moist No cervical or supraclavicular adenopathy Lungs no rales or rhonchi Heart regular rate and rhythm Abd soft, nontender, positive bowel sounds MSK no focal spinal tenderness, no upper extremity lymphedema Neuro: nonfocal, well oriented, appropriate affect Breasts: The right breast is status post mastectomy. There is no evidence of chest wall recurrence. The right axilla is benign. The left breast is unremarkable.  LAB RESULTS: Lab Results  Component Value Date   WBC 3.8 05/09/2016   NEUTROABS 5.0 05/02/2016   HGB 10.9 (L) 05/09/2016   HCT 34.8 (L) 05/09/2016   MCV 92.1 05/09/2016   PLT 98 (L)  05/09/2016      Chemistry      Component Value Date/Time   NA 144 05/09/2016 1104   NA 141 05/02/2016 0929   K 4.9 05/09/2016 1104   K 4.5 05/02/2016 0929   CL 110 05/09/2016 1104   CL 109 (H) 04/20/2013 0859   CO2 25 05/09/2016 1104   CO2 22 05/02/2016 0929   BUN 10 05/09/2016 1104   BUN 16.9 05/02/2016 0929   CREATININE 1.10 (H) 11/06/2016 0810   CREATININE 1.12 (H) 05/09/2016 1104   CREATININE 1.1 05/02/2016 0929      Component Value Date/Time   CALCIUM 8.8 05/09/2016 1104   CALCIUM 9.2 05/02/2016 0929   ALKPHOS 65 05/09/2016 1104   ALKPHOS 76 05/02/2016 0929   AST 22 05/09/2016 1104   AST 18 05/02/2016 0929   ALT 18 05/09/2016 1104   ALT 16 05/02/2016 0929   BILITOT 0.6 05/09/2016 1104   BILITOT 0.53 05/02/2016 0929        STUDIES: Left diagnostic mammography with tomography at [facility] 11/06/2016, results pending  Ct Abdomen W Wo Contrast  Result Date: 11/06/2016 CLINICAL DATA:  Status post balloon retrograde trans venous obliteration of gastric varices. EXAM: CT ABDOMEN WITHOUT AND WITH CONTRAST TECHNIQUE: Multidetector CT imaging of the abdomen was performed following the standard protocol before and following the bolus administration of intravenous contrast. CONTRAST:  119m ISOVUE-300 IOPAMIDOL (ISOVUE-300) INJECTION 61% COMPARISON:  05/15/2016 FINDINGS: Lower chest: Right lung base scarring. Normal heart size without pericardial or pleural effusion. Persistent esophageal varices, including on image 7/series 8. Hepatobiliary: Probable mild cirrhosis, as evidenced by lateral segment left liver lobe enlargement. No hypervascular liver lesions. No suspicious hypo enhancing lesions on portal venous phase imaging. Right hepatic lobe cyst. Normal gallbladder, without biliary ductal dilatation. Pancreas: Normal, without mass or ductal dilatation. Spleen: Mild splenomegaly, 14.2 cm craniocaudal. Adrenals/Urinary Tract: Punctate bilateral renal collecting system calculi. No  renal mass or hydronephrosis. Normal adrenal glands. Stomach/Bowel: Normal stomach, without wall thickening. Right-sided colonic wall thickening is superimposed upon underdistention. Example in the ileocecal junction on image 62/series 3. Normal imaged portions of the terminal ileum and appendix. Normal abdominal small bowel. Vascular/Lymphatic: Aortic and branch vessel atherosclerosis. Multiple right renal arteries. No splenic artery aneurysm. Portal and hepatic veins patent. No evidence of recurrent gastric varices No  retroperitoneal or retrocrural adenopathy. Other: No ascites. Musculoskeletal: Compression deformity involving the superior endplate of L1 is mild to moderate and similar. Mild ventral canal encroachment. Degenerative disc disease involves L4-5 and L5-S1. IMPRESSION: 1. Status post BRTO of gastric varices no evidence residual or recurrent gastric varices. There are persistent esophageal varices. 2. Splenomegaly. 3. Right-sided colonic wall thickening is nonspecific in the setting of portal venous hypertension. Most likely secondary. Infectious colitis could look similar. 4.  Aortic atherosclerosis. 5. Bilateral nephrolithiasis. 6. Chronic L1 compression deformity. Electronically Signed   By: Abigail Miyamoto M.D.   On: 11/06/2016 10:33   Name: Nicole [name], Nicole [name] Patient ID: [id number] Birth Date: 1956/12/11 Height: 64.0 in. Sex: Female Measured: 11/10/2015 Weight: 139.0 lbs. Indications: Breast Cancer History, Caucasian, Estrogen Deficient, Letrozole, Postmenopausal Fractures: Treatments: Calcium (E943.0), Fosamax, Vitamin D (E933.5)  ASSESSMENT:  The BMD measured at Femur Neck Left is 0.491 g/cm2 with a T-score of -3.9. This patient is considered osteoporotic according to [facility]) criteria. L- 1 was excluded due to degenerative changes. There has been a statistically significant decrease in BMD of Left hip since prior exam dated 09/27/2013.    ASSESSMENT: 60 y.o.  [place] woman presenting June 2011 with stage IV breast cancer involving the right breast upper outer quadrant, right axilla, mediastinal lymph nodes, and both lungs, but not the brain, liver, or bones  (1) positive right breast and right axillary lymph node biopsies 07/17/2010 of a clinical T4 N2 M1 invasive ductal carcinoma, grade 1, estrogen receptor 100% positive, progesterone receptor 6% positive, with an MIB-1 of 12%, and no HER-2 amplification.  (2) participated in Phase II tessetaxel study, receiving 2 cycles complicated by thrombocytopenia, anemia requiring transfusion, transaminase elevation, and afebrile neutropenia. Off-study as of September 2011. Chest CT scan September 2011 did show evidence of response.  (3) on letrozole as of October 2011, with continuing response and good tolerance   (4) 06/02/2012 underwent right mastectomy and axillary lymph node sampling (3 lymph nodes removed, all with viable cancer as well as evidence of treatment effect) for a ypT2 ypN1-2 invasive ductal carcinoma, grade 1, 98% estrogen receptor positive, progesterone receptor negative, with no HER-2 amplification  (5) left jugular vein DVT documented 06/16/2012; left sided Port-A-Cath removed mid July 2015; Coumadin stopped 09/07/2014.  (a) Left brachiocephalic v. slightly narrowed, with some Left lower neck collateralization noted on chest CT December 2015  (6) osteoporosis, was on alendronate, switched to zolendronate 11/07/2016  (7) cirrhosis, with splenomegaly, gastric varices, and intestinal edema  (a)  mild persistent leukopenia and thrombocytopenia likely due to splenomegaly   PLAN:  Janney is now more than 4 years out from definitive surgery for her breast cancer with no evidence of active disease. This is very favorable.  She has stage IV disease based on lung lesions seen at initial presentation June 2011. There was no evidence of lung involvement when she had her most recent CT scan of the  chest in 10/02/2015. We will follow up with a chest x-ray today  She is very concerned about the question of cirrhosis and portal hypertension. We reviewed the reason for her varices, splenomegaly, and colonic edema. She understands there are many possible causes for cirrhosis and that is why she is being evaluated by hepatology. We also discussed the fact that her mild pia and thrombocytopenia is very likely due to the splenomegaly.  She does have osteoporosis. We discussed continuing Fosamax, although she she does not like taking it, in fact takes it  irregularly, and also it can cause reflux problems which is not good idea given her varices. We're going to switch to denosumab/Prolia beginning next week.  I also encouraged her to continue the vitamin D and calcium and also start a walking program. In fact when she gets depressed and crying and think she is going to die the very best thing she can do is climb on her treadmill and do 30 minutes.  She is can see me again in 6 months. She knows to call for any problems that may develop before that visit     Chania Kochanski C    11/07/2016

## 2016-11-08 ENCOUNTER — Encounter: Payer: Self-pay | Admitting: Oncology

## 2016-11-11 ENCOUNTER — Other Ambulatory Visit: Payer: Self-pay | Admitting: *Deleted

## 2016-11-13 ENCOUNTER — Other Ambulatory Visit: Payer: Self-pay | Admitting: *Deleted

## 2016-11-13 DIAGNOSIS — I8500 Esophageal varices without bleeding: Secondary | ICD-10-CM | POA: Diagnosis not present

## 2016-11-13 DIAGNOSIS — K7460 Unspecified cirrhosis of liver: Secondary | ICD-10-CM | POA: Diagnosis not present

## 2016-11-14 ENCOUNTER — Ambulatory Visit (HOSPITAL_BASED_OUTPATIENT_CLINIC_OR_DEPARTMENT_OTHER): Payer: BLUE CROSS/BLUE SHIELD

## 2016-11-14 ENCOUNTER — Other Ambulatory Visit: Payer: Self-pay | Admitting: Oncology

## 2016-11-14 ENCOUNTER — Other Ambulatory Visit (HOSPITAL_COMMUNITY): Payer: Self-pay | Admitting: Nurse Practitioner

## 2016-11-14 VITALS — BP 160/85 | HR 93 | Temp 97.7°F | Resp 20

## 2016-11-14 DIAGNOSIS — M818 Other osteoporosis without current pathological fracture: Secondary | ICD-10-CM

## 2016-11-14 DIAGNOSIS — C50411 Malignant neoplasm of upper-outer quadrant of right female breast: Secondary | ICD-10-CM

## 2016-11-14 DIAGNOSIS — M810 Age-related osteoporosis without current pathological fracture: Secondary | ICD-10-CM

## 2016-11-14 DIAGNOSIS — K7469 Other cirrhosis of liver: Secondary | ICD-10-CM

## 2016-11-14 MED ORDER — DENOSUMAB 60 MG/ML ~~LOC~~ SOLN
60.0000 mg | Freq: Once | SUBCUTANEOUS | Status: AC
  Administered 2016-11-14: 60 mg via SUBCUTANEOUS
  Filled 2016-11-14: qty 1

## 2016-11-14 NOTE — Patient Instructions (Signed)
Denosumab injection  What is this medicine?  DENOSUMAB (den oh sue mab) slows bone breakdown. Prolia is used to treat osteoporosis in women after menopause and in men. Xgeva is used to prevent bone fractures and other bone problems caused by cancer bone metastases. Xgeva is also used to treat giant cell tumor of the bone.  This medicine may be used for other purposes; ask your health care provider or pharmacist if you have questions.  What should I tell my health care provider before I take this medicine?  They need to know if you have any of these conditions:  -dental disease  -eczema  -infection or history of infections  -kidney disease or on dialysis  -low blood calcium or vitamin D  -malabsorption syndrome  -scheduled to have surgery or tooth extraction  -taking medicine that contains denosumab  -thyroid or parathyroid disease  -an unusual reaction to denosumab, other medicines, foods, dyes, or preservatives  -pregnant or trying to get pregnant  -breast-feeding  How should I use this medicine?  This medicine is for injection under the skin. It is given by a health care professional in a hospital or clinic setting.  If you are getting Prolia, a special MedGuide will be given to you by the pharmacist with each prescription and refill. Be sure to read this information carefully each time.  For Prolia, talk to your pediatrician regarding the use of this medicine in children. Special care may be needed. For Xgeva, talk to your pediatrician regarding the use of this medicine in children. While this drug may be prescribed for children as young as 13 years for selected conditions, precautions do apply.  Overdosage: If you think you have taken too much of this medicine contact a poison control center or emergency room at once.  NOTE: This medicine is only for you. Do not share this medicine with others.  What if I miss a dose?  It is important not to miss your dose. Call your doctor or health care professional if you are  unable to keep an appointment.  What may interact with this medicine?  Do not take this medicine with any of the following medications:  -other medicines containing denosumab  This medicine may also interact with the following medications:  -medicines that suppress the immune system  -medicines that treat cancer  -steroid medicines like prednisone or cortisone  This list may not describe all possible interactions. Give your health care provider a list of all the medicines, herbs, non-prescription drugs, or dietary supplements you use. Also tell them if you smoke, drink alcohol, or use illegal drugs. Some items may interact with your medicine.  What should I watch for while using this medicine?  Visit your doctor or health care professional for regular checks on your progress. Your doctor or health care professional may order blood tests and other tests to see how you are doing.  Call your doctor or health care professional if you get a cold or other infection while receiving this medicine. Do not treat yourself. This medicine may decrease your body's ability to fight infection.  You should make sure you get enough calcium and vitamin D while you are taking this medicine, unless your doctor tells you not to. Discuss the foods you eat and the vitamins you take with your health care professional.  See your dentist regularly. Brush and floss your teeth as directed. Before you have any dental work done, tell your dentist you are receiving this medicine.  Do   not become pregnant while taking this medicine or for 5 months after stopping it. Women should inform their doctor if they wish to become pregnant or think they might be pregnant. There is a potential for serious side effects to an unborn child. Talk to your health care professional or pharmacist for more information.  What side effects may I notice from receiving this medicine?  Side effects that you should report to your doctor or health care professional as soon as  possible:  -allergic reactions like skin rash, itching or hives, swelling of the face, lips, or tongue  -breathing problems  -chest pain  -fast, irregular heartbeat  -feeling faint or lightheaded, falls  -fever, chills, or any other sign of infection  -muscle spasms, tightening, or twitches  -numbness or tingling  -skin blisters or bumps, or is dry, peels, or red  -slow healing or unexplained pain in the mouth or jaw  -unusual bleeding or bruising  Side effects that usually do not require medical attention (Report these to your doctor or health care professional if they continue or are bothersome.):  -muscle pain  -stomach upset, gas  This list may not describe all possible side effects. Call your doctor for medical advice about side effects. You may report side effects to FDA at 1-800-FDA-1088.  Where should I keep my medicine?  This medicine is only given in a clinic, doctor's office, or other health care setting and will not be stored at home.  NOTE: This sheet is a summary. It may not cover all possible information. If you have questions about this medicine, talk to your doctor, pharmacist, or health care provider.      2016, Elsevier/Gold Standard. (2012-06-15 12:37:47)

## 2016-11-28 ENCOUNTER — Other Ambulatory Visit: Payer: Self-pay | Admitting: Radiology

## 2016-11-29 ENCOUNTER — Other Ambulatory Visit: Payer: Self-pay | Admitting: Student

## 2016-11-29 ENCOUNTER — Other Ambulatory Visit: Payer: Self-pay | Admitting: Radiology

## 2016-12-02 ENCOUNTER — Ambulatory Visit (HOSPITAL_COMMUNITY)
Admission: RE | Admit: 2016-12-02 | Discharge: 2016-12-02 | Disposition: A | Discharge status: Home / Self Care | Payer: BLUE CROSS/BLUE SHIELD | Source: Ambulatory Visit | Attending: Nurse Practitioner | Admitting: Nurse Practitioner

## 2016-12-02 ENCOUNTER — Encounter (HOSPITAL_COMMUNITY): Payer: Self-pay

## 2016-12-02 DIAGNOSIS — Z79899 Other long term (current) drug therapy: Secondary | ICD-10-CM | POA: Insufficient documentation

## 2016-12-02 DIAGNOSIS — K731 Chronic lobular hepatitis, not elsewhere classified: Secondary | ICD-10-CM | POA: Diagnosis not present

## 2016-12-02 DIAGNOSIS — K759 Inflammatory liver disease, unspecified: Secondary | ICD-10-CM | POA: Insufficient documentation

## 2016-12-02 DIAGNOSIS — K9189 Other postprocedural complications and disorders of digestive system: Secondary | ICD-10-CM | POA: Diagnosis not present

## 2016-12-02 DIAGNOSIS — K7469 Other cirrhosis of liver: Secondary | ICD-10-CM | POA: Diagnosis not present

## 2016-12-02 DIAGNOSIS — K922 Gastrointestinal hemorrhage, unspecified: Secondary | ICD-10-CM | POA: Diagnosis not present

## 2016-12-02 LAB — COMPREHENSIVE METABOLIC PANEL
ALK PHOS: 67 U/L (ref 38–126)
ALT: 24 U/L (ref 14–54)
ANION GAP: 8 (ref 5–15)
AST: 34 U/L (ref 15–41)
Albumin: 3.7 g/dL (ref 3.5–5.0)
BUN: 19 mg/dL (ref 6–20)
CALCIUM: 9 mg/dL (ref 8.9–10.3)
CO2: 20 mmol/L — AB (ref 22–32)
CREATININE: 0.96 mg/dL (ref 0.44–1.00)
Chloride: 115 mmol/L — ABNORMAL HIGH (ref 101–111)
Glucose, Bld: 95 mg/dL (ref 65–99)
Potassium: 4 mmol/L (ref 3.5–5.1)
SODIUM: 143 mmol/L (ref 135–145)
TOTAL PROTEIN: 6.4 g/dL — AB (ref 6.5–8.1)
Total Bilirubin: 0.3 mg/dL (ref 0.3–1.2)

## 2016-12-02 LAB — CBC WITH DIFFERENTIAL/PLATELET
BASOS ABS: 0 10*3/uL (ref 0.0–0.1)
Basophils Relative: 1 %
EOS ABS: 0 10*3/uL (ref 0.0–0.7)
Eosinophils Relative: 0 %
HEMATOCRIT: 31.9 % — AB (ref 36.0–46.0)
Hemoglobin: 9.5 g/dL — ABNORMAL LOW (ref 12.0–15.0)
LYMPHS ABS: 0.5 10*3/uL — AB (ref 0.7–4.0)
Lymphocytes Relative: 16 %
MCH: 20.1 pg — ABNORMAL LOW (ref 26.0–34.0)
MCHC: 29.8 g/dL — AB (ref 30.0–36.0)
MCV: 67.4 fL — ABNORMAL LOW (ref 78.0–100.0)
MONOS PCT: 7 %
Monocytes Absolute: 0.2 10*3/uL (ref 0.1–1.0)
Neutro Abs: 2.5 10*3/uL (ref 1.7–7.7)
Neutrophils Relative %: 76 %
PLATELETS: 80 10*3/uL — AB (ref 150–400)
RBC: 4.73 MIL/uL (ref 3.87–5.11)
RDW: 19.1 % — AB (ref 11.5–15.5)
WBC: 3.2 10*3/uL — AB (ref 4.0–10.5)

## 2016-12-02 LAB — PROTIME-INR
INR: 1.03
PROTHROMBIN TIME: 13.5 s (ref 11.4–15.2)

## 2016-12-02 MED ORDER — MIDAZOLAM HCL 2 MG/2ML IJ SOLN
INTRAMUSCULAR | Status: AC
  Filled 2016-12-02: qty 2

## 2016-12-02 MED ORDER — MIDAZOLAM HCL 2 MG/2ML IJ SOLN
INTRAMUSCULAR | Status: AC | PRN
  Administered 2016-12-02: 0.5 mg via INTRAVENOUS
  Administered 2016-12-02: 1 mg via INTRAVENOUS

## 2016-12-02 MED ORDER — FENTANYL CITRATE (PF) 100 MCG/2ML IJ SOLN
INTRAMUSCULAR | Status: AC
  Filled 2016-12-02: qty 2

## 2016-12-02 MED ORDER — FENTANYL CITRATE (PF) 100 MCG/2ML IJ SOLN
INTRAMUSCULAR | Status: AC | PRN
  Administered 2016-12-02: 25 ug via INTRAVENOUS
  Administered 2016-12-02: 50 ug via INTRAVENOUS

## 2016-12-02 MED ORDER — SODIUM CHLORIDE 0.9 % IV SOLN: INTRAVENOUS | Status: DC

## 2016-12-02 MED ORDER — LIDOCAINE HCL (PF) 1 % IJ SOLN
INTRAMUSCULAR | Status: AC
  Filled 2016-12-02: qty 10

## 2016-12-02 NOTE — Procedures (Signed)
US liver core 18g x3 to surg path No complication No blood loss. See complete dictation in [facility] PACS.  

## 2016-12-02 NOTE — Progress Notes (Signed)
Patient ID: Nicole [name], female   DOB: 02-12-1956, 60 y.o.   MRN: [id number]    Chief Complaint: Patient was seen in consultation today for random liver biopsy  Referring Physician(s): Dr. Roosevelt Locks  Supervising Physician: Arne Cleveland  Patient Status: [facility] - Out-pt  History of Present Illness: Nicole [name] is a 60 y.o. female with history stage IV breast cancer and gastric varices s/p BRTO by Dr. Corrie Mckusick 04/13/2016.   She did have a CT scan done 11/06/2016 for post-BRTO surveillance which showed no concerning findings.  She was referred to IR by Dr. Beverley Fiedler for random liver biopsy for further evaluation of cirrhosis of unknown origin.  Patient states she has been doing well and has been in her usual state of health.  She is not on any blood thinners.  She is NPO.   Past Medical History:  Diagnosis Date  . Blood transfusion 2012  . Breast CA ([place])    breast ca dx 06/2010, stage 4, right , Lung Metasis  . Clot    jugular  ?2015  . Depression   . Esophageal varices ([place])   . Headache    Mirgraine- rare since menopause  . Heart murmur    mild, no cardiologist  . History of kidney stones    no pain- shows in CT- small 1 mm bilaterally- kidney  . IBD (inflammatory bowel disease)    resolved  . Peripheral vascular disease ([place])   . Radiation 07/21/12-09/07/12   5940 cGy    Past Surgical History:  Procedure Laterality Date  . COLONOSCOPY    . DILATION AND CURETTAGE OF UTERUS  2004?  . ESOPHAGOGASTRODUODENOSCOPY (EGD) WITH PROPOFOL N/A 04/11/2016   Procedure: ESOPHAGOGASTRODUODENOSCOPY (EGD) WITH PROPOFOL;  Surgeon: Clarene Essex, MD;  [facility];  Service: Endoscopy;  Laterality: N/A;  . ESOPHAGOGASTRODUODENOSCOPY (EGD) WITH PROPOFOL N/A 07/08/2016   Procedure: ESOPHAGOGASTRODUODENOSCOPY (EGD) WITH PROPOFOL;  Surgeon: Laurence Spates, MD;  Location: [place];  Service: Endoscopy;  Laterality: N/A;  . IR GENERIC HISTORICAL  05/15/2016   IR RADIOLOGIST EVAL &  MGMT 05/15/2016 Corrie Mckusick, DO [facility] INTERV RAD  . MASTECTOMY  06/02/12   right breast  . PORT-A-CATH REMOVAL    . PORTACATH PLACEMENT  2011   left side  . RADIOLOGY WITH ANESTHESIA N/A 04/13/2016   Procedure: RADIOLOGY WITH ANESTHESIA;  Surgeon: Corrie Mckusick, DO;  Location: [place];  Service: Anesthesiology;  Laterality: N/A;    Allergies: Patient has no known allergies.  Medications: Prior to Admission medications   Medication Sig Start Date End Date Taking? Authorizing Provider  Calcium Carbonate (CALTRATE 600 PO) Take 1 tablet by mouth daily.    Yes Historical Provider, MD  Cholecalciferol (VITAMIN D PO) Take 5,000 Units by mouth daily.    Yes Historical Provider, MD  letrozole (FEMARA) 2.5 MG tablet Take 1 tablet (2.5 mg total) by mouth daily. 11/07/16  Yes Chauncey Cruel, MD  venlafaxine XR (EFFEXOR-XR) 150 MG 24 hr capsule Take 1 capsule (150 mg total) by mouth daily with breakfast. 08/22/16  Yes Chauncey Cruel, MD  denosumab (PROLIA) 60 MG/ML SOLN injection Inject 60 mg into the skin every 6 (six) months. Administer in upper arm, thigh, or abdomen    Historical Provider, MD     Family History  Problem Relation Age of Onset  . COPD Mother   . AAA (abdominal aortic aneurysm) Mother   . COPD Father   . Cancer Neg Hx     Social  History   Social History  . Marital status: Married    Spouse name: N/A  . Number of children: N/A  . Years of education: N/A   Social History Main Topics  . Smoking status: Never Smoker  . Smokeless tobacco: Never Used  . Alcohol use No  . Drug use: No  . Sexual activity: Yes   Other Topics Concern  . Not on file   Social History Narrative  . No narrative on file     Review of Systems: A 12 point ROS discussed and pertinent positives are indicated in the HPI above.  All other systems are negative.  Review of Systems  Constitutional: Positive for fatigue. Negative for chills and fever.  Respiratory: Negative for cough and  shortness of breath.   Cardiovascular: Negative for chest pain.  Psychiatric/Behavioral: Negative for behavioral problems and confusion.    Vital Signs: BP (!) 185/97   Pulse (!) 102   Temp 97.9 F (36.6 C) (Oral)   Resp 18   Ht 5\' 4"  (1.626 m)   Wt 135 lb (61.2 kg)   SpO2 98%   BMI 23.17 kg/m   Physical Exam  Constitutional: She is oriented to person, place, and time. She appears well-developed and well-nourished.  Cardiovascular: Normal rate and regular rhythm.   Pulmonary/Chest: Effort normal and breath sounds normal. No respiratory distress.  Abdominal: There is no tenderness.  Neurological: She is alert and oriented to person, place, and time.  Skin: Skin is warm and dry.  Psychiatric: She has a normal mood and affect. Her behavior is normal. Judgment and thought content normal.  Nursing note and vitals reviewed.   Mallampati Score:  MD Evaluation Airway: WNL Heart: WNL Abdomen: WNL Chest/ Lungs: WNL ASA  Classification: 3 Mallampati/Airway Score: One  Imaging: Dg Chest 2 View  Result Date: 11/07/2016 CLINICAL DATA:  Breast cancer EXAM: CHEST  2 VIEW COMPARISON:  12/28/2014 FINDINGS: Pleural-based opacity at the right apex is stable. No new mass or consolidation. The heart is normal in size. Embolization coils now project over the left upper quadrant. No pneumothorax or pleural effusion. Normal heart size. Stable L1 compression deformity. IMPRESSION: No active cardiopulmonary disease. Electronically Signed   By: Marybelle Killings M.D.   On: 11/07/2016 11:24   Ct Abdomen W Wo Contrast  Result Date: 11/06/2016 CLINICAL DATA:  Status post balloon retrograde trans venous obliteration of gastric varices. EXAM: CT ABDOMEN WITHOUT AND WITH CONTRAST TECHNIQUE: Multidetector CT imaging of the abdomen was performed following the standard protocol before and following the bolus administration of intravenous contrast. CONTRAST:  126mL ISOVUE-300 IOPAMIDOL (ISOVUE-300) INJECTION 61%  COMPARISON:  05/15/2016 FINDINGS: Lower chest: Right lung base scarring. Normal heart size without pericardial or pleural effusion. Persistent esophageal varices, including on image 7/series 8. Hepatobiliary: Probable mild cirrhosis, as evidenced by lateral segment left liver lobe enlargement. No hypervascular liver lesions. No suspicious hypo enhancing lesions on portal venous phase imaging. Right hepatic lobe cyst. Normal gallbladder, without biliary ductal dilatation. Pancreas: Normal, without mass or ductal dilatation. Spleen: Mild splenomegaly, 14.2 cm craniocaudal. Adrenals/Urinary Tract: Punctate bilateral renal collecting system calculi. No renal mass or hydronephrosis. Normal adrenal glands. Stomach/Bowel: Normal stomach, without wall thickening. Right-sided colonic wall thickening is superimposed upon underdistention. Example in the ileocecal junction on image 62/series 3. Normal imaged portions of the terminal ileum and appendix. Normal abdominal small bowel. Vascular/Lymphatic: Aortic and branch vessel atherosclerosis. Multiple right renal arteries. No splenic artery aneurysm. Portal and hepatic veins patent. No evidence  of recurrent gastric varices No retroperitoneal or retrocrural adenopathy. Other: No ascites. Musculoskeletal: Compression deformity involving the superior endplate of L1 is mild to moderate and similar. Mild ventral canal encroachment. Degenerative disc disease involves L4-5 and L5-S1. IMPRESSION: 1. Status post BRTO of gastric varices no evidence residual or recurrent gastric varices. There are persistent esophageal varices. 2. Splenomegaly. 3. Right-sided colonic wall thickening is nonspecific in the setting of portal venous hypertension. Most likely secondary. Infectious colitis could look similar. 4.  Aortic atherosclerosis. 5. Bilateral nephrolithiasis. 6. Chronic L1 compression deformity. Electronically Signed   By: Abigail Miyamoto M.D.   On: 11/06/2016 10:33   Mm Screening Breast  Tomo Uni L  Result Date: 11/07/2016 CLINICAL DATA:  Screening. EXAM: 2D DIGITAL SCREENING UNILATERAL LEFT MAMMOGRAM WITH CAD AND ADJUNCT TOMO COMPARISON:  Previous exam(s). ACR Breast Density Category c: The breast tissue is heterogeneously dense, which may obscure small masses. FINDINGS: The patient has had a right mastectomy. There are no findings suspicious for malignancy. Images were processed with CAD. IMPRESSION: No mammographic evidence of malignancy. A result letter of this screening mammogram will be mailed directly to the patient. RECOMMENDATION: Screening mammogram in one year.  (Code:SM-L-32M) BI-RADS CATEGORY  1: Negative. Electronically Signed   By: Claudie Revering M.D.   On: 11/07/2016 15:23    Labs:  CBC:  Recent Labs  05/02/16 0929 05/09/16 1104 11/07/16 0948 12/02/16 1235  WBC 6.3 3.8 5.4 3.2*  HGB 10.5* 10.9* 10.1* 9.5*  HCT 32.2* 34.8* 33.5* 31.9*  PLT 158 98* 90* PENDING    COAGS:  Recent Labs  04/12/16 0959 12/02/16 1235  INR 1.06 1.03    BMP:  Recent Labs  04/12/16 0215  05/02/16 0929 05/09/16 1104 11/06/16 0810 11/07/16 0949 12/02/16 1235  NA 140  < > 141 144  --  140 143  K 3.6  < > 4.5 4.9  --  4.1 4.0  CL 112*  --   --  110  --   --  115*  CO2 23  < > 22 25  --  22 20*  GLUCOSE 100*  < > 95 96  --  99 95  BUN 17  < > 16.9 10  --  14.9 19  CALCIUM 7.6*  < > 9.2 8.8  --  9.2 9.0  CREATININE 0.82  < > 1.1 1.12* 1.10* 1.1 0.96  GFRNONAA >60  --   --  53*  --   --  >60  GFRAA >60  --   --  61  --   --  >60  < > = values in this interval not displayed.  LIVER FUNCTION TESTS:  Recent Labs  05/02/16 0929 05/09/16 1104 11/07/16 0949 12/02/16 1235  BILITOT 0.53 0.6 0.67 0.3  AST 18 22 25  34  ALT 16 18 25 24   ALKPHOS 76 65 87 67  PROT 7.1 6.4 7.1 6.4*  ALBUMIN 3.8 4.1 3.7 3.7    TUMOR MARKERS: No results for input(s): AFPTM, CEA, CA199, CHROMGRNA in the last 8760 hours.  Assessment and Plan:  Random liver biopsy Patient with  history of breast cancer and gastric varices is now being elevated for cirrhosis of unknown origin.   A random liver biopsy was requested by Dr. Beverley Fiedler.  Patient is scheduled for the procedure today.  She is not on blood thinners and has been NPO as of last night.  Risks and Benefits discussed with the patient including, but not limited to bleeding, infection, damage  to adjacent structures or low yield requiring additional tests. All of the patient's questions were answered, patient is agreeable to proceed. Consent signed and in chart.  Thank you for this interesting consult.  I greatly enjoyed meeting Nicole [name] and look forward to participating in their care.  A copy of this report was sent to the requesting provider on this date.  Electronically Signed: Docia Barrier 12/02/2016, 1:24 PM   I spent a total of  10 Minutes in face to face in clinical consultation, greater than 50% of which was counseling/coordinating care for cirrhosis of unknown origin.

## 2016-12-02 NOTE — Discharge Instructions (Signed)
Liver Biopsy, Care After °Introduction °These instructions give you information on caring for yourself after your procedure. Your doctor may also give you more specific instructions. Call your doctor if you have any problems or questions after your procedure. °Follow these instructions at home: °· Rest at home for 1-2 days or as told by your doctor. °· Have someone stay with you for at least 24 hours. °· Do not do these things in the first 24 hours: °¨ Drive. °¨ Use machinery. °¨ Take care of other people. °¨ Sign legal documents. °¨ Take a bath or shower. °· There are many different ways to close and cover a cut (incision). For example, a cut can be closed with stitches, skin glue, or adhesive strips. Follow your doctor's instructions on: °¨ Taking care of your cut. °¨ Changing and removing your bandage (dressing). °¨ Removing whatever was used to close your cut. °· Do not drink alcohol in the first week. °· Do not lift more than 5 pounds or play contact sports for the first 2 weeks. °· Take medicines only as told by your doctor. For 1 week, do not take medicine that has aspirin in it or medicines like ibuprofen. °· Get your test results. °Contact a doctor if: °· A cut bleeds and leaves more than just a small spot of blood. °· A cut is red, puffs up (swells), or hurts more than before. °· Fluid or something else comes from a cut. °· A cut smells bad. °· You have a fever or chills. °Get help right away if: °· You have swelling, bloating, or pain in your belly (abdomen). °· You get dizzy or faint. °· You have a rash. °· You feel sick to your stomach (nauseous) or throw up (vomit). °· You have trouble breathing, feel short of breath, or feel faint. °· Your chest hurts. °· You have problems talking or seeing. °· You have trouble balancing or moving your arms or legs. °This information is not intended to replace advice given to you by your health care provider. Make sure you discuss any questions you have with your  health care provider. °Document Released: 09/24/2008 Document Revised: 05/23/2016 Document Reviewed: 02/11/2014 °© 2017 Elsevier ° °

## 2016-12-03 ENCOUNTER — Telehealth: Payer: Self-pay

## 2016-12-03 DIAGNOSIS — C50411 Malignant neoplasm of upper-outer quadrant of right female breast: Secondary | ICD-10-CM

## 2016-12-03 MED ORDER — LETROZOLE 2.5 MG PO TABS: 2.5000 mg | ORAL_TABLET | Freq: Every day | ORAL | 0 refills | Status: DC

## 2016-12-03 MED ORDER — VENLAFAXINE HCL ER 150 MG PO CP24: 150.0000 mg | ORAL_CAPSULE | Freq: Every day | ORAL | 3 refills | Status: DC

## 2016-12-03 NOTE — Telephone Encounter (Signed)
Incoming fax letrozole refill. S/w pt which pharmacy to send rx to. She also requested effexor refill. Done.

## 2016-12-04 ENCOUNTER — Telehealth: Payer: Self-pay | Admitting: *Deleted

## 2016-12-04 NOTE — Telephone Encounter (Signed)
"  Suanne Marker with [facility] in [place] calling about a refill of letrozole for this patient." Refill sent 12-03-2016.  Nurse spoke with patient yesterday to verify what pharmacy.   "I will notify Shenae where order is if she calls back."

## 2016-12-09 ENCOUNTER — Other Ambulatory Visit: Payer: Self-pay | Admitting: Nurse Practitioner

## 2016-12-09 DIAGNOSIS — K766 Portal hypertension: Secondary | ICD-10-CM

## 2016-12-10 ENCOUNTER — Other Ambulatory Visit: Payer: Self-pay | Admitting: Nurse Practitioner

## 2016-12-10 DIAGNOSIS — K766 Portal hypertension: Secondary | ICD-10-CM

## 2016-12-11 ENCOUNTER — Other Ambulatory Visit: Payer: Self-pay | Admitting: Oncology

## 2016-12-11 DIAGNOSIS — C50911 Malignant neoplasm of unspecified site of right female breast: Secondary | ICD-10-CM

## 2016-12-19 ENCOUNTER — Ambulatory Visit
Admission: RE | Admit: 2016-12-19 | Discharge: 2016-12-19 | Disposition: A | Discharge status: Home / Self Care | Payer: BLUE CROSS/BLUE SHIELD | Source: Ambulatory Visit | Attending: Nurse Practitioner | Admitting: Nurse Practitioner

## 2016-12-19 DIAGNOSIS — K7460 Unspecified cirrhosis of liver: Secondary | ICD-10-CM | POA: Diagnosis not present

## 2016-12-19 DIAGNOSIS — K766 Portal hypertension: Secondary | ICD-10-CM

## 2017-01-01 DIAGNOSIS — I8500 Esophageal varices without bleeding: Secondary | ICD-10-CM | POA: Diagnosis not present

## 2017-01-01 DIAGNOSIS — K766 Portal hypertension: Secondary | ICD-10-CM | POA: Diagnosis not present

## 2017-01-06 DIAGNOSIS — Z9889 Other specified postprocedural states: Secondary | ICD-10-CM | POA: Diagnosis not present

## 2017-01-06 DIAGNOSIS — Z4889 Encounter for other specified surgical aftercare: Secondary | ICD-10-CM | POA: Diagnosis not present

## 2017-01-07 ENCOUNTER — Other Ambulatory Visit (HOSPITAL_COMMUNITY): Payer: Self-pay | Admitting: Nurse Practitioner

## 2017-01-07 DIAGNOSIS — K766 Portal hypertension: Secondary | ICD-10-CM

## 2017-01-13 ENCOUNTER — Other Ambulatory Visit: Payer: Self-pay | Admitting: Radiology

## 2017-01-14 ENCOUNTER — Other Ambulatory Visit (HOSPITAL_COMMUNITY): Payer: Self-pay | Admitting: Nurse Practitioner

## 2017-01-14 ENCOUNTER — Ambulatory Visit (HOSPITAL_COMMUNITY)
Admission: RE | Admit: 2017-01-14 | Discharge: 2017-01-14 | Disposition: A | Discharge status: Home / Self Care | Payer: BLUE CROSS/BLUE SHIELD | Source: Ambulatory Visit | Attending: Nurse Practitioner | Admitting: Nurse Practitioner

## 2017-01-14 ENCOUNTER — Encounter (HOSPITAL_COMMUNITY): Payer: Self-pay

## 2017-01-14 DIAGNOSIS — R011 Cardiac murmur, unspecified: Secondary | ICD-10-CM | POA: Insufficient documentation

## 2017-01-14 DIAGNOSIS — I864 Gastric varices: Secondary | ICD-10-CM | POA: Insufficient documentation

## 2017-01-14 DIAGNOSIS — Z87442 Personal history of urinary calculi: Secondary | ICD-10-CM | POA: Diagnosis not present

## 2017-01-14 DIAGNOSIS — G43909 Migraine, unspecified, not intractable, without status migrainosus: Secondary | ICD-10-CM | POA: Diagnosis not present

## 2017-01-14 DIAGNOSIS — I739 Peripheral vascular disease, unspecified: Secondary | ICD-10-CM | POA: Diagnosis not present

## 2017-01-14 DIAGNOSIS — K766 Portal hypertension: Secondary | ICD-10-CM | POA: Diagnosis not present

## 2017-01-14 DIAGNOSIS — F329 Major depressive disorder, single episode, unspecified: Secondary | ICD-10-CM | POA: Insufficient documentation

## 2017-01-14 DIAGNOSIS — Z853 Personal history of malignant neoplasm of breast: Secondary | ICD-10-CM | POA: Insufficient documentation

## 2017-01-14 DIAGNOSIS — K589 Irritable bowel syndrome without diarrhea: Secondary | ICD-10-CM | POA: Diagnosis not present

## 2017-01-14 DIAGNOSIS — I8510 Secondary esophageal varices without bleeding: Secondary | ICD-10-CM | POA: Insufficient documentation

## 2017-01-14 HISTORY — PX: IR GENERIC HISTORICAL: IMG1180011

## 2017-01-14 LAB — CBC WITH DIFFERENTIAL/PLATELET
BASOS ABS: 0 10*3/uL (ref 0.0–0.1)
Basophils Relative: 0 %
EOS ABS: 0 10*3/uL (ref 0.0–0.7)
Eosinophils Relative: 0 %
HCT: 35.7 % — ABNORMAL LOW (ref 36.0–46.0)
Hemoglobin: 10.7 g/dL — ABNORMAL LOW (ref 12.0–15.0)
LYMPHS ABS: 0.8 10*3/uL (ref 0.7–4.0)
Lymphocytes Relative: 16 %
MCH: 20.2 pg — ABNORMAL LOW (ref 26.0–34.0)
MCHC: 30 g/dL (ref 30.0–36.0)
MCV: 67.2 fL — ABNORMAL LOW (ref 78.0–100.0)
MONO ABS: 0.4 10*3/uL (ref 0.1–1.0)
MONOS PCT: 7 %
Neutro Abs: 3.9 10*3/uL (ref 1.7–7.7)
Neutrophils Relative %: 77 %
PLATELETS: 88 10*3/uL — AB (ref 150–400)
RBC: 5.31 MIL/uL — ABNORMAL HIGH (ref 3.87–5.11)
RDW: 19.8 % — AB (ref 11.5–15.5)
WBC: 5.1 10*3/uL (ref 4.0–10.5)

## 2017-01-14 LAB — PROTIME-INR
INR: 1.04
PROTHROMBIN TIME: 13.6 s (ref 11.4–15.2)

## 2017-01-14 LAB — BASIC METABOLIC PANEL
Anion gap: 9 (ref 5–15)
BUN: 18 mg/dL (ref 6–20)
CALCIUM: 9.1 mg/dL (ref 8.9–10.3)
CO2: 22 mmol/L (ref 22–32)
CREATININE: 1.07 mg/dL — AB (ref 0.44–1.00)
Chloride: 108 mmol/L (ref 101–111)
GFR calc Af Amer: >60 mL/min (ref >60)
GFR, EST NON AFRICAN AMERICAN: 54 mL/min — AB (ref >60)
Glucose, Bld: 95 mg/dL (ref 65–99)
POTASSIUM: 3.9 mmol/L (ref 3.5–5.1)
SODIUM: 139 mmol/L (ref 135–145)

## 2017-01-14 LAB — APTT: APTT: 26 s (ref 24–36)

## 2017-01-14 MED ORDER — FENTANYL CITRATE (PF) 100 MCG/2ML IJ SOLN
INTRAMUSCULAR | Status: AC
  Filled 2017-01-14: qty 4

## 2017-01-14 MED ORDER — LIDOCAINE HCL (PF) 1 % IJ SOLN
INTRAMUSCULAR | Status: AC | PRN
  Administered 2017-01-14: 5 mL

## 2017-01-14 MED ORDER — SODIUM CHLORIDE 0.9 % IV SOLN
INTRAVENOUS | Status: DC
  Administered 2017-01-14: 09:00:00 via INTRAVENOUS

## 2017-01-14 MED ORDER — LIDOCAINE HCL (PF) 1 % IJ SOLN
INTRAMUSCULAR | Status: AC
  Filled 2017-01-14: qty 5

## 2017-01-14 MED ORDER — MIDAZOLAM HCL 2 MG/2ML IJ SOLN
INTRAMUSCULAR | Status: AC | PRN
  Administered 2017-01-14: 1 mg via INTRAVENOUS

## 2017-01-14 MED ORDER — IOPAMIDOL (ISOVUE-300) INJECTION 61%
INTRAVENOUS | Status: AC
  Administered 2017-01-14: 6 mL
  Filled 2017-01-14: qty 100

## 2017-01-14 MED ORDER — FENTANYL CITRATE (PF) 100 MCG/2ML IJ SOLN
INTRAMUSCULAR | Status: AC | PRN
  Administered 2017-01-14: 50 ug via INTRAVENOUS

## 2017-01-14 MED ORDER — MIDAZOLAM HCL 2 MG/2ML IJ SOLN
INTRAMUSCULAR | Status: AC
  Filled 2017-01-14: qty 4

## 2017-01-14 NOTE — Sedation Documentation (Signed)
Patient is resting comfortably. 

## 2017-01-14 NOTE — Procedures (Signed)
S/p RT hepatic venogram with hemodynamic pressures No comp Stable Full report in pacs

## 2017-01-14 NOTE — Sedation Documentation (Signed)
Wedged hepatic pressure is 21

## 2017-01-14 NOTE — Discharge Instructions (Signed)
Care After These instructions give you information about caring for yourself after your procedure. Your doctor may also give you more specific instructions. Call your doctor if you have any problems or questions after your procedure. Follow these instructions at home:  Take medicines only as told by your doctor.  Follow your doctor's instructions about:  Care of the area where the tube was inserted.  Bandage (dressing) changes and removal.  You may shower 24-48 hours after the procedure or as told by your doctor.  Do not take baths, swim, or use a hot tub until your doctor approves.  Every day, check the area where the tube was inserted. Watch for:  Redness, swelling, or pain.  Fluid, blood, or pus.  Do not apply powder or lotion to the site.  Do not lift anything that is heavier than 10 lb (4.5 kg) for 5 days or as told by your doctor.  Ask your doctor when you can:  Return to work or school.  Do physical activities or play sports.  Have sex.  Do not drive or operate heavy machinery for 24 hours or as told by your doctor.  Have someone with you for the first 24 hours after the procedure.  Keep all follow-up visits as told by your doctor. This is important. Contact a health care provider if:  You have a fever.  You have chills.  You have more bleeding from the area where the tube was inserted. Hold pressure on the area.  You have redness, swelling, or pain in the area where the tube was inserted.  You have fluid or pus coming from the area. Get help right away if:  You have a lot of pain in the area where the tube was inserted.  The area where the tube was inserted is bleeding, and the bleeding does not stop after 30 minutes of holding steady pressure on the area.  The area near or just beyond the insertion site becomes pale, cool, tingly, or numb. This information is not intended to replace advice given to you by your health care provider. Make sure you  discuss any questions you have with your health care provider. Document Released: 03/14/2009 Document Revised: 05/23/2016 Document Reviewed: 05/19/2013 Elsevier Interactive Patient Education  2017 [employer].

## 2017-01-14 NOTE — Sedation Documentation (Signed)
Dressing to right neck intact

## 2017-01-14 NOTE — Sedation Documentation (Signed)
Corrected sinuosotal pressure is 13

## 2017-01-14 NOTE — Sedation Documentation (Signed)
Three pressure is 8

## 2017-01-14 NOTE — H&P (Signed)
Chief Complaint: portal HTN  Referring Physician:Dawn Drazek, NP  Supervising Physician: Daryll Brod  Patient Status: [facility] - Out-pt  HPI: Nicole [name] is an 61 y.o. female with a history of portal HTN of unknown etiology.  She had an UGI bleed in April of 2017 and underwent a BRTO by Dr. Earleen Newport.  She has done very well from that procedure and her follow up CTs and endoscopies have shown good results.  She underwent a liver biopsy in December 2017 to evaluate for cirrhosis.  This biopsy was negative.  She is still followed by [facility].  A request has been made to obtain hepatic venous pressures to see if this will help determine her etiology of portal hypertension.  She has no complaints today and is feeling well.  Past Medical History:  Past Medical History:  Diagnosis Date  . Blood transfusion 2012  . Breast CA ([place])    breast ca dx 06/2010, stage 4, right , Lung Metasis  . Clot    jugular  ?2015  . Depression   . Esophageal varices ([place])   . Headache    Mirgraine- rare since menopause  . Heart murmur    mild, no cardiologist  . History of kidney stones    no pain- shows in CT- small 1 mm bilaterally- kidney  . IBD (inflammatory bowel disease)    resolved  . Peripheral vascular disease ([place])   . Radiation 07/21/12-09/07/12   5940 cGy    Past Surgical History:  Past Surgical History:  Procedure Laterality Date  . COLONOSCOPY    . DILATION AND CURETTAGE OF UTERUS  2004?  . ESOPHAGOGASTRODUODENOSCOPY (EGD) WITH PROPOFOL N/A 04/11/2016   Procedure: ESOPHAGOGASTRODUODENOSCOPY (EGD) WITH PROPOFOL;  Surgeon: Clarene Essex, MD;  [facility];  Service: Endoscopy;  Laterality: N/A;  . ESOPHAGOGASTRODUODENOSCOPY (EGD) WITH PROPOFOL N/A 07/08/2016   Procedure: ESOPHAGOGASTRODUODENOSCOPY (EGD) WITH PROPOFOL;  Surgeon: Laurence Spates, MD;  Location: [place];  Service: Endoscopy;  Laterality: N/A;  . IR GENERIC HISTORICAL  05/15/2016   IR RADIOLOGIST EVAL & MGMT  05/15/2016 Corrie Mckusick, DO [facility] INTERV RAD  . MASTECTOMY  06/02/12   right breast  . PORT-A-CATH REMOVAL    . PORTACATH PLACEMENT  2011   left side  . RADIOLOGY WITH ANESTHESIA N/A 04/13/2016   Procedure: RADIOLOGY WITH ANESTHESIA;  Surgeon: Corrie Mckusick, DO;  Location: [place];  Service: Anesthesiology;  Laterality: N/A;    Family History:  Family History  Problem Relation Age of Onset  . COPD Mother   . AAA (abdominal aortic aneurysm) Mother   . COPD Father   . Cancer Neg Hx     Social History:  reports that she has never smoked. She has never used smokeless tobacco. She reports that she does not drink alcohol or use drugs.  Allergies: No Known Allergies  Medications: Medications reviewed in epic  Please HPI for pertinent positives, otherwise complete 10 system ROS negative.  Mallampati Score: MD Evaluation Airway: WNL Heart: WNL Abdomen: WNL Chest/ Lungs: WNL ASA  Classification: 3 Mallampati/Airway Score: One  Physical Exam: BP (!) 167/86   Pulse (!) 108   Temp 97.6 F (36.4 C) (Oral)   Resp 20   Ht 5\' 4"  (1.626 m)   Wt 134 lb (60.8 kg)   SpO2 96%   BMI 23.00 kg/m  Body mass index is 23 kg/m. General: pleasant, WD, WN white female who is laying in bed in NAD HEENT: head is normocephalic, atraumatic.  Sclera are noninjected.  PERRL.  Ears and nose without any masses or lesions.  Mouth is pink and moist Heart: regular, rate, and rhythm.  Normal s1,s2. +murmur. No obvious gallops, or rubs noted.  Palpable radial and pedal pulses bilaterally Lungs: CTAB, no wheezes, rhonchi, or rales noted.  Respiratory effort nonlabored Abd: soft, NT, ND, +BS, no masses, hernias, or organomegaly Psych: A&Ox3 with an appropriate affect.   Labs: pending  Imaging: No results found.  Assessment/Plan 1. Portal HTN -we will plan to proceed with a transjugular approach to evaluate her hepatic venous pressure. -vitals have been reviewed.  Her labs are pending -Risks and  Benefits discussed with the patient including, but not limited to bleeding, infection, contrast induced renal failure, or vessel injury. All of the patient's questions were answered, patient is agreeable to proceed. Consent signed and in chart.  Thank you for this interesting consult.  I greatly enjoyed meeting Nicole [name] and look forward to participating in their care.  A copy of this report was sent to the requesting provider on this date.  Electronically Signed: Henreitta Cea 01/14/2017, 9:14 AM   I spent a total of    25 Minutes in face to face in clinical consultation, greater than 50% of which was counseling/coordinating care for portal HTN

## 2017-02-25 ENCOUNTER — Other Ambulatory Visit: Payer: Self-pay | Admitting: Gastroenterology

## 2017-02-28 ENCOUNTER — Encounter (HOSPITAL_COMMUNITY): Payer: Self-pay | Admitting: *Deleted

## 2017-03-02 NOTE — Anesthesia Preprocedure Evaluation (Addendum)
Anesthesia Evaluation  Patient identified by MRN, date of birth, ID band Patient awake    Reviewed: Allergy & Precautions, NPO status , Patient's Chart, lab work & pertinent test results  Airway Mallampati: I  TM Distance: >3 FB Neck ROM: Full    Dental no notable dental hx. (+) Teeth Intact   Pulmonary    Pulmonary exam normal        Cardiovascular + Peripheral Vascular Disease  Normal cardiovascular exam+ Valvular Problems/Murmurs  Rhythm:Regular Rate:Normal + Systolic murmurs    Neuro/Psych  Headaches,    GI/Hepatic (+) Cirrhosis       ,   Endo/Other    Renal/GU      Musculoskeletal   Abdominal   Peds  Hematology   Anesthesia Other Findings Breast CA w/ mets  Reproductive/Obstetrics                           Anesthesia Physical  Anesthesia Plan  ASA: II  Anesthesia Plan: MAC   Post-op Pain Management:    Induction: Intravenous  Airway Management Planned:   Additional Equipment:   Intra-op Plan:   Post-operative Plan:   Informed Consent: I have reviewed the patients History and Physical, chart, labs and discussed the procedure including the risks, benefits and alternatives for the proposed anesthesia with the patient or authorized representative who has indicated his/her understanding and acceptance.   Dental advisory given  Plan Discussed with: CRNA and Anesthesiologist  Anesthesia Plan Comments:       Anesthesia Quick Evaluation

## 2017-03-03 ENCOUNTER — Encounter (HOSPITAL_COMMUNITY)
Admission: RE | Disposition: A | Discharge status: Home / Self Care | Payer: Self-pay | Source: Ambulatory Visit | Attending: Gastroenterology

## 2017-03-03 ENCOUNTER — Ambulatory Visit (HOSPITAL_COMMUNITY)
Admission: RE | Admit: 2017-03-03 | Discharge: 2017-03-03 | Disposition: A | Discharge status: Home / Self Care | Payer: BLUE CROSS/BLUE SHIELD | Source: Ambulatory Visit | Attending: Gastroenterology | Admitting: Gastroenterology

## 2017-03-03 ENCOUNTER — Encounter (HOSPITAL_COMMUNITY): Payer: Self-pay | Admitting: *Deleted

## 2017-03-03 ENCOUNTER — Ambulatory Visit (HOSPITAL_COMMUNITY): Payer: BLUE CROSS/BLUE SHIELD | Admitting: Anesthesiology

## 2017-03-03 DIAGNOSIS — K7460 Unspecified cirrhosis of liver: Secondary | ICD-10-CM | POA: Insufficient documentation

## 2017-03-03 DIAGNOSIS — Z9011 Acquired absence of right breast and nipple: Secondary | ICD-10-CM | POA: Insufficient documentation

## 2017-03-03 DIAGNOSIS — Z79899 Other long term (current) drug therapy: Secondary | ICD-10-CM | POA: Insufficient documentation

## 2017-03-03 DIAGNOSIS — Z86718 Personal history of other venous thrombosis and embolism: Secondary | ICD-10-CM | POA: Diagnosis not present

## 2017-03-03 DIAGNOSIS — M810 Age-related osteoporosis without current pathological fracture: Secondary | ICD-10-CM | POA: Insufficient documentation

## 2017-03-03 DIAGNOSIS — F329 Major depressive disorder, single episode, unspecified: Secondary | ICD-10-CM | POA: Diagnosis not present

## 2017-03-03 DIAGNOSIS — Z09 Encounter for follow-up examination after completed treatment for conditions other than malignant neoplasm: Secondary | ICD-10-CM | POA: Insufficient documentation

## 2017-03-03 DIAGNOSIS — Z923 Personal history of irradiation: Secondary | ICD-10-CM | POA: Insufficient documentation

## 2017-03-03 DIAGNOSIS — Z85118 Personal history of other malignant neoplasm of bronchus and lung: Secondary | ICD-10-CM | POA: Diagnosis not present

## 2017-03-03 DIAGNOSIS — I8500 Esophageal varices without bleeding: Secondary | ICD-10-CM | POA: Diagnosis not present

## 2017-03-03 DIAGNOSIS — Z853 Personal history of malignant neoplasm of breast: Secondary | ICD-10-CM | POA: Insufficient documentation

## 2017-03-03 DIAGNOSIS — I864 Gastric varices: Secondary | ICD-10-CM | POA: Diagnosis not present

## 2017-03-03 DIAGNOSIS — Z9221 Personal history of antineoplastic chemotherapy: Secondary | ICD-10-CM | POA: Diagnosis not present

## 2017-03-03 DIAGNOSIS — I739 Peripheral vascular disease, unspecified: Secondary | ICD-10-CM | POA: Diagnosis not present

## 2017-03-03 DIAGNOSIS — I8511 Secondary esophageal varices with bleeding: Secondary | ICD-10-CM | POA: Diagnosis not present

## 2017-03-03 HISTORY — PX: ESOPHAGOGASTRODUODENOSCOPY (EGD) WITH PROPOFOL: SHX5813

## 2017-03-03 SURGERY — ESOPHAGOGASTRODUODENOSCOPY (EGD) WITH PROPOFOL
Anesthesia: Monitor Anesthesia Care

## 2017-03-03 MED ORDER — LIDOCAINE 2% (20 MG/ML) 5 ML SYRINGE
INTRAMUSCULAR | Status: DC | PRN
  Administered 2017-03-03: 40 mg via INTRAVENOUS

## 2017-03-03 MED ORDER — PROPOFOL 500 MG/50ML IV EMUL
INTRAVENOUS | Status: DC | PRN
  Administered 2017-03-03: 200 ug/kg/min via INTRAVENOUS

## 2017-03-03 MED ORDER — LACTATED RINGERS IV SOLN
INTRAVENOUS | Status: DC
  Administered 2017-03-03: 08:00:00 via INTRAVENOUS

## 2017-03-03 NOTE — Anesthesia Postprocedure Evaluation (Signed)
Anesthesia Post Note  Patient: Nicole [name]  Procedure(s) Performed: Procedure(s) (LRB): ESOPHAGOGASTRODUODENOSCOPY (EGD) WITH PROPOFOL (N/A)  Patient location during evaluation: PACU Anesthesia Type: MAC Level of consciousness: awake and alert Pain management: pain level controlled Vital Signs Assessment: post-procedure vital signs reviewed and stable Respiratory status: spontaneous breathing Cardiovascular status: stable Anesthetic complications: no       Last Vitals:  Vitals:   03/03/17 0940 03/03/17 0948  BP: (!) 144/71 (!) 136/48  Pulse: 77 80  Resp: 16 19  Temp:      Last Pain:  Vitals:   03/03/17 0928  TempSrc: Oral                 [name]

## 2017-03-03 NOTE — H&P (Signed)
Subjective:   Patient is a 61 y.o. female presents with  Portal hypertension of unclear etiology. He is being followed at the liver clinic here. She has esophageal varices by endoscopy year ago and this procedure is done to evaluate the progression of these varices. She has never had variceal bleeding and so far, she has not been started prophylactic beta blocker therapy. Procedure including risks and benefits discussed in office.  Patient Active Problem List   Diagnosis Date Noted  . Osteoporosis 05/09/2016  . Depression 04/30/2016  . UTI (urinary tract infection) 04/30/2016  . Acute upper GI bleed 04/11/2016  . Esophageal varices ([place]) 04/11/2016  . Breast cancer metastasized to lung ([place]) 03/14/2014  . Breast cancer of upper-outer quadrant of right female breast ([place]) 09/30/2013  . Lymphedema of arm 06/14/2013  . DVT (deep venous thrombosis) ([place]) 12/15/2012   Past Medical History:  Diagnosis Date  . Blood transfusion 2012  . Breast CA ([place])    breast ca dx 06/2010, stage 4, right , Lung Metasis.Surgery, Chemo, Radiation  . Clot    jugular  ?2015  . Depression   . Esophageal varices ([place])   . Headache    Mirgraine- rare since menopause  . Heart murmur    mild, no cardiologist, from birth  . History of kidney stones    no pain- shows in CT- small 1 mm bilaterally- kidney  . IBD (inflammatory bowel disease)    resolved  . Peripheral vascular disease ([place])    Patient does not know anything about this.  . Radiation 07/21/12-09/07/12   5940 cGy    Past Surgical History:  Procedure Laterality Date  . COLONOSCOPY    . DILATION AND CURETTAGE OF UTERUS  2004?  . ESOPHAGOGASTRODUODENOSCOPY (EGD) WITH PROPOFOL N/A 04/11/2016   Procedure: ESOPHAGOGASTRODUODENOSCOPY (EGD) WITH PROPOFOL;  Surgeon: Clarene Essex, MD;  Location: [facility];  Service: Endoscopy;  Laterality: N/A;  . ESOPHAGOGASTRODUODENOSCOPY (EGD) WITH PROPOFOL N/A 07/08/2016   Procedure: ESOPHAGOGASTRODUODENOSCOPY (EGD)  WITH PROPOFOL;  Surgeon: Laurence Spates, MD;  Location: [place];  Service: Endoscopy;  Laterality: N/A;  . IR GENERIC HISTORICAL  05/15/2016   IR RADIOLOGIST EVAL & MGMT 05/15/2016 Corrie Mckusick, DO [facility]  . IR GENERIC HISTORICAL  01/14/2017   IR US GUIDE VASC ACCESS RIGHT 01/14/2017 Greggory Keen, MD [facility]  . IR GENERIC HISTORICAL  01/14/2017   IR VENOGRAM HEPATIC W HEMODYNAMIC EVALUATION 01/14/2017 Greggory Keen, MD [facility]  . MASTECTOMY  06/02/12   right breast  . PORT-A-CATH REMOVAL    . PORTACATH PLACEMENT  2011   left side  . RADIOLOGY WITH ANESTHESIA N/A 04/13/2016   Procedure: RADIOLOGY WITH ANESTHESIA;  Surgeon: Corrie Mckusick, DO;  Location: [place];  Service: Anesthesiology;  Laterality: N/A;    Prescriptions Prior to Admission  Medication Sig Dispense Refill Last Dose  . Calcium Carb-Cholecalciferol (CALCIUM 600 + D PO) Take 1 tablet by mouth daily.   03/02/2017 at Unknown time  . Cholecalciferol (VITAMIN D3) 5000 units CAPS Take 5,000 Units by mouth daily.   03/02/2017 at Unknown time  . letrozole (FEMARA) 2.5 MG tablet Take 1 tablet (2.5 mg total) by mouth daily. 90 tablet 0 03/02/2017 at Unknown time  . Magnesium 500 MG TABS Take 500 mg by mouth daily.   03/02/2017 at Unknown time  . venlafaxine XR (EFFEXOR-XR) 150 MG 24 hr capsule Take 1 capsule (150 mg total) by mouth daily with breakfast. 30 capsule 3 03/02/2017 at Unknown time  .  denosumab (PROLIA) 60 MG/ML SOLN injection Inject 60 mg into the skin every 6 (six) months. Administer in upper arm, thigh, or abdomen   More than a month at Unknown time   No Known Allergies  Social History  Substance Use Topics  . Smoking status: Never Smoker  . Smokeless tobacco: Never Used  . Alcohol use No    Family History  Problem Relation Age of Onset  . COPD Mother   . AAA (abdominal aortic aneurysm) Mother   . COPD Father   . Cancer Neg Hx      Objective:   Patient Vitals for the past 8 hrs:  BP Temp Temp src Pulse  Resp SpO2 Height Weight  03/03/17 0755 (!) 150/91 97.8 F (36.6 C) Oral 91 15 99 % 5\' 4"  (1.626 m) 60.8 kg (134 lb)   No intake/output data recorded. No intake/output data recorded.   See MD Preop evaluation      Assessment:   1. Portal hypertension of unclear etiology. She has documented elevated hepatic wedge pressure gradient measure recently by interventional radiology. Extensive workup is failed to show any etiology of her liver disease. Biopsies of the liver and not children cirrhosis.  Plan:   We will proceed with EGD to evaluate for varices. Procedure has been discussed with the patient and her husband.

## 2017-03-03 NOTE — Transfer of Care (Signed)
Immediate Anesthesia Transfer of Care Note  Patient: Nicole [name]  Procedure(s) Performed: Procedure(s): ESOPHAGOGASTRODUODENOSCOPY (EGD) WITH PROPOFOL (N/A)  Patient Location: Endoscopy Unit  Anesthesia Type:MAC  Level of Consciousness: awake, alert , oriented and patient cooperative  Airway & Oxygen Therapy: Patient Spontanous Breathing  Post-op Assessment: Report given to RN, Post -op Vital signs reviewed and stable and Patient moving all extremities X 4  Post vital signs: Reviewed and stable  Last Vitals:  Vitals:   03/03/17 0755  BP: (!) 150/91  Pulse: 91  Resp: 15  Temp: 36.6 C    Last Pain:  Vitals:   03/03/17 0755  TempSrc: Oral         Complications: No apparent anesthesia complications

## 2017-03-03 NOTE — Op Note (Signed)
[facility] Patient Name: Nicole [name] Procedure Date : 03/03/2017 MRN: [id number] Attending MD: Nancy Fetter Dr., MD Date of Birth: 16-May-1956 CSN: [id number] Age: 61 Admit Type: Inpatient Procedure:                Upper GI endoscopy Indications:              Esophageal varices, Follow-up of esophageal varices Providers:                Jeneen Rinks L. Ledarrius Beauchaine Dr., MD, Cleda Daub, RN, Elspeth Cho Tech., Technician Referring MD:             Liver Clinic Medicines:                Monitored Anesthesia Care Complications:            No immediate complications. Estimated Blood Loss:     Estimated blood loss: none. Procedure:                Pre-Anesthesia Assessment:                           - Prior to the procedure, a History and Physical                            was performed, and patient medications and                            allergies were reviewed. The patient's tolerance of                            previous anesthesia was also reviewed. The risks                            and benefits of the procedure and the sedation                            options and risks were discussed with the patient.                            All questions were answered, and informed consent                            was obtained. Prior Anticoagulants: The patient has                            taken no previous anticoagulant or antiplatelet                            agents. ASA Grade Assessment: II - A patient with                            mild systemic disease. After reviewing the risks  and benefits, the patient was deemed in                            satisfactory condition to undergo the procedure.                           After obtaining informed consent, the endoscope was                            passed under direct vision. Throughout the                            procedure, the patient's blood pressure, pulse,  and                            oxygen saturations were monitored continuously. The                            EG-[id number] ([id number]) scope was introduced through the                            mouth, and advanced to the second part of duodenum.                            The upper GI endoscopy was accomplished without                            difficulty. The patient tolerated the procedure                            well. Scope In: Scope Out: Findings:      Two columns of non-bleeding grade II varices were found in the lower       third of the esophagus,. No stigmata of recent bleeding were evident and       no red wale signs were present. The varices appeared larger than they       were at prior exam.      There is no endoscopic evidence of bleeding, erythema or inflammatory       changes suggestive of gastritis, ulceration or varices in the entire       examined stomach.      The examined duodenum was normal. Impression:               - Non-bleeding grade II esophageal varices. these                            are somewhat larger than her prior EGD 1 year ago                           - Normal examined duodenum.                           - No specimens collected. Moderate Sedation:      MAC by anesthesia Recommendation:           - Patient has a  contact number available for                            emergencies. The signs and symptoms of potential                            delayed complications were discussed with the                            patient. Return to normal activities tomorrow.                            Written discharge instructions were provided to the                            patient.                           - Resume previous diet.                           - Continue present medications.                           - Repeat upper endoscopy in 1 year for surveillance. Procedure Code(s):        --- Professional ---                           [phone],  Esophagogastroduodenoscopy, flexible,                            transoral; diagnostic, including collection of                            specimen(s) by brushing or washing, when performed                            (separate procedure) Diagnosis Code(s):        --- Professional ---                           I85.00, Esophageal varices without bleeding CPT copyright 2016 American Medical Association. All rights reserved. The codes documented in this report are preliminary and upon coder review may  be revised to meet current compliance requirements. Nancy Fetter Dr., MD 03/03/2017 9:27:55 AM This report has been signed electronically. Number of Addenda: 0

## 2017-03-03 NOTE — Discharge Instructions (Signed)

## 2017-05-09 ENCOUNTER — Telehealth: Payer: Self-pay | Admitting: Oncology

## 2017-05-09 NOTE — Telephone Encounter (Signed)
Spoke with patient and confirmed appointment change.

## 2017-05-13 ENCOUNTER — Other Ambulatory Visit: Payer: Self-pay | Admitting: Interventional Radiology

## 2017-05-13 DIAGNOSIS — I864 Gastric varices: Secondary | ICD-10-CM

## 2017-05-14 ENCOUNTER — Ambulatory Visit: Payer: BLUE CROSS/BLUE SHIELD

## 2017-05-14 ENCOUNTER — Other Ambulatory Visit: Payer: BLUE CROSS/BLUE SHIELD

## 2017-05-14 ENCOUNTER — Ambulatory Visit: Payer: BLUE CROSS/BLUE SHIELD | Admitting: Oncology

## 2017-05-14 ENCOUNTER — Other Ambulatory Visit: Payer: Self-pay | Admitting: Radiology

## 2017-05-14 DIAGNOSIS — I864 Gastric varices: Secondary | ICD-10-CM

## 2017-05-15 ENCOUNTER — Other Ambulatory Visit: Payer: Self-pay | Admitting: Radiology

## 2017-05-15 DIAGNOSIS — I864 Gastric varices: Secondary | ICD-10-CM

## 2017-05-19 ENCOUNTER — Other Ambulatory Visit (HOSPITAL_BASED_OUTPATIENT_CLINIC_OR_DEPARTMENT_OTHER): Payer: BLUE CROSS/BLUE SHIELD

## 2017-05-19 ENCOUNTER — Ambulatory Visit (HOSPITAL_BASED_OUTPATIENT_CLINIC_OR_DEPARTMENT_OTHER): Payer: BLUE CROSS/BLUE SHIELD | Admitting: Oncology

## 2017-05-19 ENCOUNTER — Ambulatory Visit (HOSPITAL_COMMUNITY)
Admission: RE | Admit: 2017-05-19 | Discharge: 2017-05-19 | Disposition: A | Discharge status: Home / Self Care | Payer: BLUE CROSS/BLUE SHIELD | Source: Ambulatory Visit | Attending: Oncology | Admitting: Oncology

## 2017-05-19 ENCOUNTER — Ambulatory Visit (HOSPITAL_BASED_OUTPATIENT_CLINIC_OR_DEPARTMENT_OTHER): Payer: BLUE CROSS/BLUE SHIELD

## 2017-05-19 VITALS — BP 142/78 | HR 100 | Temp 98.2°F | Resp 20 | Ht 64.0 in | Wt 137.7 lb

## 2017-05-19 DIAGNOSIS — C50411 Malignant neoplasm of upper-outer quadrant of right female breast: Secondary | ICD-10-CM

## 2017-05-19 DIAGNOSIS — C7800 Secondary malignant neoplasm of unspecified lung: Principal | ICD-10-CM

## 2017-05-19 DIAGNOSIS — J8410 Pulmonary fibrosis, unspecified: Secondary | ICD-10-CM | POA: Diagnosis not present

## 2017-05-19 DIAGNOSIS — Z170 Estrogen receptor positive status [ER+]: Secondary | ICD-10-CM

## 2017-05-19 DIAGNOSIS — C50911 Malignant neoplasm of unspecified site of right female breast: Secondary | ICD-10-CM

## 2017-05-19 DIAGNOSIS — D508 Other iron deficiency anemias: Secondary | ICD-10-CM

## 2017-05-19 DIAGNOSIS — Z853 Personal history of malignant neoplasm of breast: Secondary | ICD-10-CM

## 2017-05-19 DIAGNOSIS — D509 Iron deficiency anemia, unspecified: Secondary | ICD-10-CM | POA: Diagnosis not present

## 2017-05-19 DIAGNOSIS — M810 Age-related osteoporosis without current pathological fracture: Secondary | ICD-10-CM

## 2017-05-19 DIAGNOSIS — J439 Emphysema, unspecified: Secondary | ICD-10-CM | POA: Diagnosis not present

## 2017-05-19 DIAGNOSIS — M818 Other osteoporosis without current pathological fracture: Secondary | ICD-10-CM

## 2017-05-19 LAB — COMPREHENSIVE METABOLIC PANEL
ALT: 21 U/L (ref 0–55)
ANION GAP: 12 meq/L — AB (ref 3–11)
AST: 19 U/L (ref 5–34)
Albumin: 4.2 g/dL (ref 3.5–5.0)
Alkaline Phosphatase: 63 U/L (ref 40–150)
BILIRUBIN TOTAL: 0.5 mg/dL (ref 0.20–1.20)
BUN: 19.8 mg/dL (ref 7.0–26.0)
CALCIUM: 8.9 mg/dL (ref 8.4–10.4)
CO2: 19 meq/L — AB (ref 22–29)
CREATININE: 1.1 mg/dL (ref 0.6–1.1)
Chloride: 111 mEq/L — ABNORMAL HIGH (ref 98–109)
EGFR: 55 mL/min/{1.73_m2} — ABNORMAL LOW (ref >90)
Glucose: 103 mg/dl (ref 70–140)
Potassium: 3.7 mEq/L (ref 3.5–5.1)
SODIUM: 141 meq/L (ref 136–145)
Total Protein: 7.2 g/dL (ref 6.4–8.3)

## 2017-05-19 LAB — CBC WITH DIFFERENTIAL/PLATELET
BASO%: 0.5 % (ref 0.0–2.0)
Basophils Absolute: 0 10*3/uL (ref 0.0–0.1)
EOS%: 0 % (ref 0.0–7.0)
Eosinophils Absolute: 0 10*3/uL (ref 0.0–0.5)
HCT: 30 % — ABNORMAL LOW (ref 34.8–46.6)
HGB: 8.8 g/dL — ABNORMAL LOW (ref 11.6–15.9)
LYMPH%: 19.3 % (ref 14.0–49.7)
MCH: 19.5 pg — AB (ref 25.1–34.0)
MCHC: 29.3 g/dL — AB (ref 31.5–36.0)
MCV: 66.5 fL — ABNORMAL LOW (ref 79.5–101.0)
MONO#: 0.4 10*3/uL (ref 0.1–0.9)
MONO%: 8.6 % (ref 0.0–14.0)
NEUT%: 71.6 % (ref 38.4–76.8)
NEUTROS ABS: 3.2 10*3/uL (ref 1.5–6.5)
Platelets: 128 10*3/uL — ABNORMAL LOW (ref 145–400)
RBC: 4.51 10*6/uL (ref 3.70–5.45)
RDW: 17.3 % — ABNORMAL HIGH (ref 11.2–14.5)
WBC: 4.4 10*3/uL (ref 3.9–10.3)
lymph#: 0.9 10*3/uL (ref 0.9–3.3)
nRBC: 0 % (ref 0–0)

## 2017-05-19 MED ORDER — DENOSUMAB 60 MG/ML ~~LOC~~ SOLN
60.0000 mg | Freq: Once | SUBCUTANEOUS | Status: AC
  Administered 2017-05-19: 60 mg via SUBCUTANEOUS
  Filled 2017-05-19: qty 1

## 2017-05-19 NOTE — Progress Notes (Signed)
ID: [name]   DOB: 05/03/1956  MR#: [id number]  EHO#:[id number]  PCP:  GYN:  SURolm Bookbinder, MD OTHER MD: Gery Pray, MD  CHIEF COMPLAINT:  Metastatic Breast Cancer  CURRENT TREATMENT: Letrozole, denosumab/Prolia, Feraheme  INTERVAL HISTORY: Haile returns today for follow-up of her estrogen receptor positive stage IV breast cancer accompanied by her husband Merry Proud. She continues on letrozole. Generally she tolerates this well. She has some hot flashes, which can be bothersome and at times. Vaginal dryness is not an issue. She obtains a drug in approximately $8 per month.  She is followed closely by GI and hepatology, and had a repeat EGD under Dr. Oletta Lamas 03/03/2017. She denies any hemoptysis, persistent nausea or vomiting, loss of appetite, or change in bowel or bladder habits.   REVIEW OF SYSTEMS:  She complains of fatigue, but not worsening shortness of breath or fainting. She denies palpitations or chest pain or pressure. A detailed review of systems today was otherwise stable.  BREAST CANCER HISTORY: From Dr. Collier Salina Rubin's 07/25/2010 note:  "This woman has not had any significant medical intervention for some time.  Her last mammogram was about seven years ago.  She noted a right breast mass about a year ago and did not seek immediate medical attention for this.  She ultimately, after some time, admitted this to her husband and self-referred herself for intervention.  She had a mammogram on 07/17/2010 with an ultrasound of the right breast.  This showed a lobulated mass upper right outer quadrant measuring at least 6.3 x 7.3 cm and large right axillary lymph nodes were also noted.  There was skin thickening overlying the mass.  On physical exam, this mass was about an 8 cm with skin dimpling noted. Discolored area in the skin over the mass, fullness in the axilla.  Biopsy was recommended, which took place on 07/17/2010.  Pathology showed invasive ductal cancer involving both  lymph node and breast.  The HER-2 was not amplified.  ER and PR both positive at 100% and 6% respectively.  Proliferative index was 12% involving both the lymph node and breast mass.  An MRI scan of both breasts was performed on 07/22/2010, essentially which showed a large heterogeneously enhancing mass of the right breast with washout kinetics measuring 6.4 x 4.4 x 5.0 cm.  Numerous satellite nodules were seen throughout the dominant mass.  There were several suspicious nodules in the upper central and upper inner quadrant of the right breast.  There were two enhancing subcentimeter nodules seen in the right pectoralis muscle.  No obvious chest wall nodules were seen; however, there was seen some metastatic adenopathy in the mediastinum as well as hila with a 2.4 x 2.0 cm mediastinal lymph node as well as 1.2 x 3.0 cm subcarinal lymph node.  There was a right hilar and probable left hilar adenopathy.  Bilateral pulmonary nodules were seen and a right T2 lesion was seen anterior right liver as well."  Her subsequent history is as detailed below.  PAST MEDICAL HISTORY: Past Medical History:  Diagnosis Date  . Blood transfusion 2012  . Breast CA ([place])    breast ca dx 06/2010, stage 4, right , Lung Metasis  . Clot    jugular  ?2015  . Depression   . Esophageal varices ([place])   . Headache    Mirgraine- rare since menopause  . Heart murmur    mild, no cardiologist  . History of kidney stones    no pain- shows  in CT- small 1 mm bilaterally- kidney  . IBD (inflammatory bowel disease)    resolved  . Peripheral vascular disease ([place])   . Radiation 07/21/12-09/07/12   5940 cGy    PAST SURGICAL HISTORY: Past Surgical History:  Procedure Laterality Date  . COLONOSCOPY    . DILATION AND CURETTAGE OF UTERUS  2004?  . ESOPHAGOGASTRODUODENOSCOPY (EGD) WITH PROPOFOL N/A 04/11/2016   Procedure: ESOPHAGOGASTRODUODENOSCOPY (EGD) WITH PROPOFOL;  Surgeon: Clarene Essex, MD;  [facility];  Service:  Endoscopy;  Laterality: N/A;  . ESOPHAGOGASTRODUODENOSCOPY (EGD) WITH PROPOFOL N/A 07/08/2016   Procedure: ESOPHAGOGASTRODUODENOSCOPY (EGD) WITH PROPOFOL;  Surgeon: Laurence Spates, MD;  Location: [place];  Service: Endoscopy;  Laterality: N/A;  . IR GENERIC HISTORICAL  05/15/2016   IR RADIOLOGIST EVAL & MGMT 05/15/2016 Corrie Mckusick, DO [facility] INTER[facility] RAD  . MASTECTOMY  06/02/12   right breast  . PORT-A-CATH REMOVAL    . PORTACATH PLACEMENT  2011   left side  . RADIOLOGY WITH ANESTHESIA N/A 04/13/2016   Procedure: RADIOLOGY WITH ANESTHESIA;  Surgeon: Corrie Mckusick, DO;  Location: [place];  Service: Anesthesiology;  Laterality: N/A;    FAMILY HISTORY Family History  Problem Relation Age of Onset  . Cancer Neg Hx   . COPD Mother   . AAA (abdominal aortic aneurysm) Mother   . COPD Father    patient's father died at the age of 23 from heart disease. Patient's mother died at the age of 51 status post CABG. The patient had no brothers, 2 sisters. There is no history of breast or ovarian cancer in the family.  GYNECOLOGIC HISTORY: Updated May 2018 Menarche age 10, she is GX P0. She does not recall when she went through menopause. She never took hormone replacement.  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 She retired in November 2017 Husband Merry Proud is retired from [employer]. They have no children and no pets. They attend a local [place].   ADVANCED DIRECTIVES: Not in place  HEALTH MAINTENANCE: (updated 03/14/2014)  Social History  Substance Use Topics  . Smoking status: Never Smoker  . Smokeless tobacco: Never Used  . Alcohol use No     Colonoscopy: Never  PAP: Remote  Bone density: 09/27/2013, osteoporosis with a T score of -2.8  Lipid panel: Not on file   No Known Allergies  Outpatient Encounter Prescriptions as of 05/19/2017  Medication Sig Note  . Calcium Carb-Cholecalciferol (CALCIUM 600 + D PO)  Take 1 tablet by mouth daily.   . Cholecalciferol (VITAMIN D3) 5000 units CAPS Take 5,000 Units by mouth daily.   [name] denosumab (PROLIA) 60 MG/ML SOLN injection Inject 60 mg into the skin every 6 (six) months. Administer in upper arm, thigh, or abdomen 11/28/2016: Last Dose Nov 2017  . letrozole (FEMARA) 2.5 MG tablet Take 1 tablet (2.5 mg total) by mouth daily.   . Magnesium 500 MG TABS Take 500 mg by mouth daily.   [name] venlafaxine XR (EFFEXOR-XR) 150 MG 24 hr capsule Take 1 capsule (150 mg total) by mouth daily with breakfast.    Facility-Administered Encounter Medications as of 05/19/2017  Medication  . denosumab (PROLIA) injection 60 mg   No current facility-administered medications for this visit.     OBJECTIVE: Middle-aged white woman Who appears well  Vitals:   05/19/17 1329  BP: (!) 142/78  Pulse: 100  Resp: 20  Temp: 98.2 F (36.8 C)  TempSrc: Oral  SpO2: 100%  Weight: 137 lb 11.2 oz (62.5 kg)  Height: _0  (1.626 m)   Sclerae unicteric, pupils round and equal Oropharynx clear and moist No cervical or supraclavicular adenopathy Lungs no rales or rhonchi Heart regular rate and rhythm Abd soft, nontender, positive bowel sounds MSK no focal spinal tenderness, no upper extremity lymphedema Neuro: nonfocal, well oriented, appropriate affect Breasts: The right breast is undergone mastectomy. There is an area of telangiectasias over the radiation port. There is no evidence of local recurrence. The left breast is unremarkable. Both axillae are benign.   LAB RESULTS: Lab Results  Component Value Date   WBC 3.8 05/09/2016   NEUTROABS 5.0 05/02/2016   HGB 10.9 (L) 05/09/2016   HCT 34.8 (L) 05/09/2016   MCV 92.1 05/09/2016   PLT 98 (L) 05/09/2016      Chemistry    CMP Latest Ref Rng & Units 05/19/2017 01/14/2017 12/02/2016  Glucose 70 - 140 mg/dl 103 95 95  BUN 7.0 - 26.0 mg/dL 19._1 Creatinine 0.6 - 1.1 mg/dL 1.1 1.07(H) 0.96  Sodium 136 - 145 mEq/L 141 139 143   Potassium 3.5 - 5.1 mEq/L 3.7 3.9 4.0  Chloride 101 - 111 mmol/L - 108 115(H)  CO2 22 - 29 mEq/L 19(L) 22 20(L)  Calcium 8.4 - 10.4 mg/dL 8.9 9.1 9.0  Total Protein 6.4 - 8.3 g/dL 7.2 - 6.4(L)  Total Bilirubin 0.20 - 1.20 mg/dL 0.50 - 0.3  Alkaline Phos 40 - 150 U/L 63 - 67  AST 5 - 34 U/L 19 - 34  ALT 0 - 55 U/L 21 - 24     STUDIES:Dg Chest 2 View  Result Date: 05/19/2017 CLINICAL DATA:  The history of metastatic breast cancer. EXAM: CHEST  2 VIEW COMPARISON:  11/07/2016 FINDINGS: The cardiac silhouette, mediastinal and hilar contours are within normal limits and stable. There are chronic lung changes with emphysema and pulmonary scarring. No acute overlying pulmonary process. No definite pulmonary lesions. The bony thorax is intact. Stable left upper quadrant cord related to prior BRTO. IMPRESSION: Chronic lung changes but no acute pulmonary findings. Electronically Signed   By: Marijo Sanes M.D.   On: 05/19/2017 16:14    ASSESSMENT: 61 y.o. [place] woman presenting June 2011 with stage IV breast cancer involving the right breast upper outer quadrant, right axilla, mediastinal lymph nodes, and both lungs, but not the brain, liver, or bones  (1) positive right breast and right axillary lymph node biopsies 07/17/2010 of a clinical T4 N2 M1 invasive ductal carcinoma, grade 1, estrogen receptor 100% positive, progesterone receptor 6% positive, with an MIB-1 of 12%, and no HER-2 amplification.  (2) participated in Phase II tessetaxel study, receiving 2 cycles complicated by thrombocytopenia, anemia requiring transfusion, transaminase elevation, and afebrile neutropenia. Off-study as of September 2011. Chest CT scan September 2011 did show evidence of response.  (3) on letrozole as of October 2011, with continuing response and good tolerance   (4) 06/02/2012 underwent right mastectomy and axillary lymph node sampling (3 lymph nodes removed, all with viable cancer as well as evidence of  treatment effect) for a ypT2 ypN1-2 invasive ductal carcinoma, grade 1, 98% estrogen receptor positive, progesterone receptor negative, with no HER-2 amplification  (5) left jugular vein DVT documented 06/16/2012; left sided Port-A-Cath removed mid July 2015; Coumadin stopped 09/07/2014.  (a) Left brachiocephalic v. slightly narrowed, with some Left lower neck collateralization noted on chest CT December 2015  (6) osteoporosis, was on  alendronate, switched to denosumab/Prolia 11/07/2016  (7) cirrhosis, with splenomegaly, gastric varices, and intestinal edema  (a)  mild persistent leukopenia and thrombocytopenia likely due to splenomegaly   PLAN:  Elias is now 5 years out from surgery for her metastatic breast cancer, with no evidence of active disease. This is very favorable. A repeat chest x-ray today is pending  She is tolerating the letrozole well, and the plan is to continue that indefinitely.  She does have osteoporosis. She is tolerating the Prolia well and she will receive a dose today.  She has significant iron deficiency anemia, with an MCV of less than 70 (prior MCV 7 in the normal range). She is symptomatic from this.  because is likely related to her esophageal varices history. She has not been able to tolerate oral iron and she is also afraid that it mask the possible bleed by turning her stool black. We discussed Feraheme and she has a good understanding of the possible toxicities side effects and complications of this agent.  She is going to receive Feraheme on 06/12/2017 and 06/19/2017. I anticipate by the time she returns for her next Prolia dose, when we will again check her labs, her hemoglobin will be hopefully above 10 and preferably over 11.  She is not currently on a proton pump inhibitor or beta blocker. That is at the discretion of her GI specialists. She will continue follow-up with them in June and July  Otherwise she will receive her next Prolia in 6 months and she  will have a repeat chest x-ray at that time. She will see me again in one year. She knows to call for any other problems that may develop before her next visit here.    MAGRINAT,GUSTAV C   05/19/2017

## 2017-05-19 NOTE — Patient Instructions (Signed)
Denosumab injection  What is this medicine?  DENOSUMAB (den oh sue mab) slows bone breakdown. Prolia is used to treat osteoporosis in women after menopause and in men. Xgeva is used to prevent bone fractures and other bone problems caused by cancer bone metastases. Xgeva is also used to treat giant cell tumor of the bone.  This medicine may be used for other purposes; ask your health care provider or pharmacist if you have questions.  What should I tell my health care provider before I take this medicine?  They need to know if you have any of these conditions:  -dental disease  -eczema  -infection or history of infections  -kidney disease or on dialysis  -low blood calcium or vitamin D  -malabsorption syndrome  -scheduled to have surgery or tooth extraction  -taking medicine that contains denosumab  -thyroid or parathyroid disease  -an unusual reaction to denosumab, other medicines, foods, dyes, or preservatives  -pregnant or trying to get pregnant  -breast-feeding  How should I use this medicine?  This medicine is for injection under the skin. It is given by a health care professional in a hospital or clinic setting.  If you are getting Prolia, a special MedGuide will be given to you by the pharmacist with each prescription and refill. Be sure to read this information carefully each time.  For Prolia, talk to your pediatrician regarding the use of this medicine in children. Special care may be needed. For Xgeva, talk to your pediatrician regarding the use of this medicine in children. While this drug may be prescribed for children as young as 13 years for selected conditions, precautions do apply.  Overdosage: If you think you have taken too much of this medicine contact a poison control center or emergency room at once.  NOTE: This medicine is only for you. Do not share this medicine with others.  What if I miss a dose?  It is important not to miss your dose. Call your doctor or health care professional if you are  unable to keep an appointment.  What may interact with this medicine?  Do not take this medicine with any of the following medications:  -other medicines containing denosumab  This medicine may also interact with the following medications:  -medicines that suppress the immune system  -medicines that treat cancer  -steroid medicines like prednisone or cortisone  This list may not describe all possible interactions. Give your health care provider a list of all the medicines, herbs, non-prescription drugs, or dietary supplements you use. Also tell them if you smoke, drink alcohol, or use illegal drugs. Some items may interact with your medicine.  What should I watch for while using this medicine?  Visit your doctor or health care professional for regular checks on your progress. Your doctor or health care professional may order blood tests and other tests to see how you are doing.  Call your doctor or health care professional if you get a cold or other infection while receiving this medicine. Do not treat yourself. This medicine may decrease your body's ability to fight infection.  You should make sure you get enough calcium and vitamin D while you are taking this medicine, unless your doctor tells you not to. Discuss the foods you eat and the vitamins you take with your health care professional.  See your dentist regularly. Brush and floss your teeth as directed. Before you have any dental work done, tell your dentist you are receiving this medicine.  Do   not become pregnant while taking this medicine or for 5 months after stopping it. Women should inform their doctor if they wish to become pregnant or think they might be pregnant. There is a potential for serious side effects to an unborn child. Talk to your health care professional or pharmacist for more information.  What side effects may I notice from receiving this medicine?  Side effects that you should report to your doctor or health care professional as soon as  possible:  -allergic reactions like skin rash, itching or hives, swelling of the face, lips, or tongue  -breathing problems  -chest pain  -fast, irregular heartbeat  -feeling faint or lightheaded, falls  -fever, chills, or any other sign of infection  -muscle spasms, tightening, or twitches  -numbness or tingling  -skin blisters or bumps, or is dry, peels, or red  -slow healing or unexplained pain in the mouth or jaw  -unusual bleeding or bruising  Side effects that usually do not require medical attention (Report these to your doctor or health care professional if they continue or are bothersome.):  -muscle pain  -stomach upset, gas  This list may not describe all possible side effects. Call your doctor for medical advice about side effects. You may report side effects to FDA at 1-800-FDA-1088.  Where should I keep my medicine?  This medicine is only given in a clinic, doctor's office, or other health care setting and will not be stored at home.  NOTE: This sheet is a summary. It may not cover all possible information. If you have questions about this medicine, talk to your doctor, pharmacist, or health care provider.      2016, Elsevier/Gold Standard. (2012-06-15 12:37:47)

## 2017-06-11 ENCOUNTER — Other Ambulatory Visit: Payer: Self-pay

## 2017-06-12 ENCOUNTER — Ambulatory Visit (HOSPITAL_BASED_OUTPATIENT_CLINIC_OR_DEPARTMENT_OTHER): Payer: BLUE CROSS/BLUE SHIELD

## 2017-06-12 ENCOUNTER — Ambulatory Visit (HOSPITAL_COMMUNITY)
Admission: RE | Admit: 2017-06-12 | Discharge: 2017-06-12 | Disposition: A | Discharge status: Home / Self Care | Payer: BLUE CROSS/BLUE SHIELD | Source: Ambulatory Visit | Attending: Interventional Radiology | Admitting: Interventional Radiology

## 2017-06-12 ENCOUNTER — Ambulatory Visit
Admission: RE | Admit: 2017-06-12 | Discharge: 2017-06-12 | Disposition: A | Discharge status: Home / Self Care | Payer: BLUE CROSS/BLUE SHIELD | Source: Ambulatory Visit | Attending: Interventional Radiology | Admitting: Interventional Radiology

## 2017-06-12 VITALS — BP 143/75 | HR 93 | Temp 98.0°F | Resp 18

## 2017-06-12 DIAGNOSIS — D509 Iron deficiency anemia, unspecified: Secondary | ICD-10-CM | POA: Diagnosis not present

## 2017-06-12 DIAGNOSIS — K766 Portal hypertension: Secondary | ICD-10-CM | POA: Diagnosis not present

## 2017-06-12 DIAGNOSIS — M810 Age-related osteoporosis without current pathological fracture: Secondary | ICD-10-CM

## 2017-06-12 DIAGNOSIS — R918 Other nonspecific abnormal finding of lung field: Secondary | ICD-10-CM | POA: Insufficient documentation

## 2017-06-12 DIAGNOSIS — I864 Gastric varices: Secondary | ICD-10-CM

## 2017-06-12 DIAGNOSIS — R161 Splenomegaly, not elsewhere classified: Secondary | ICD-10-CM | POA: Insufficient documentation

## 2017-06-12 DIAGNOSIS — Z853 Personal history of malignant neoplasm of breast: Secondary | ICD-10-CM | POA: Insufficient documentation

## 2017-06-12 DIAGNOSIS — D508 Other iron deficiency anemias: Secondary | ICD-10-CM

## 2017-06-12 DIAGNOSIS — I8500 Esophageal varices without bleeding: Secondary | ICD-10-CM | POA: Insufficient documentation

## 2017-06-12 DIAGNOSIS — I8501 Esophageal varices with bleeding: Secondary | ICD-10-CM | POA: Diagnosis not present

## 2017-06-12 HISTORY — PX: IR RADIOLOGIST EVAL & MGMT: IMG5224

## 2017-06-12 MED ORDER — SODIUM CHLORIDE 0.9 % IV SOLN
INTRAVENOUS | Status: DC
  Administered 2017-06-12: 15:00:00 via INTRAVENOUS

## 2017-06-12 MED ORDER — SODIUM CHLORIDE 0.9 % IV SOLN
510.0000 mg | Freq: Once | INTRAVENOUS | Status: AC
  Administered 2017-06-12: 510 mg via INTRAVENOUS
  Filled 2017-06-12: qty 17

## 2017-06-12 MED ORDER — IOPAMIDOL (ISOVUE-300) INJECTION 61%
INTRAVENOUS | Status: AC
  Administered 2017-06-12: 100 mL
  Filled 2017-06-12: qty 100

## 2017-06-12 NOTE — Progress Notes (Signed)
Chief Complaint: I have portal hypertension  Referring Physician(s): Dr Laurence Spates   Hepatology: Ms Roosevelt Locks PCP: Dr. Milagros Evener Hematology: Dr. Sarajane Jews Magrinat  History of Present Illness: Nicole [name] is a 61 y.o. female presenting as a scheduled follow up to [facility] today, SP BRTO for bleeding gastric varices performed 04/13/2016.    She was admitted on 04/11/2016 with lower GI bleeding, with discovery of gastric varices on CT imaging and endoscopy.  Her Hgb dropped at her initial hospitalization from 14 to 8.    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    A BRTO was performed on 04/13/2016 for her GV's via the RCFV, with approach through the spontaneous gastro-spleno-renal shunt.    She was discharged home after recovery on 04/15/2016.    Interval History:  She has had interval CT's  04/14/2016, 11/06/2016, and today, showing no evidence of recurrent/persistent large gastric varices.    She has had no additional bleeding events after her treatment.  She has since undergone liver biopsy and portal pressure gradient measurements.  She has a hepatic pressure gradient of 39mmHg.  Biopsy result shows mild periportal inflammation.  She tells me that she does not yet have a diagnosis for the etiology of her portal HTN.   She has had a recent endoscopy from Dr. Oletta Lamas, where she had esophageal varices, but no evidence of bleeding. No GV's were noted.        Past Medical History:  Diagnosis Date  . Blood transfusion 2012  . Breast CA ([place])    breast ca dx 06/2010, stage 4, right , Lung Metasis.Surgery, Chemo, Radiation  . Clot    jugular  ?2015  . Depression   . Esophageal varices ([place])   . Headache    Mirgraine- rare since menopause  . Heart murmur    mild, no cardiologist, from birth  . History of kidney stones    no pain- shows in CT- small 1 mm  bilaterally- kidney  . IBD (inflammatory bowel disease)    resolved  . Peripheral vascular disease ([place])    Patient does not know anything about this.  . Radiation 07/21/12-09/07/12   5940 cGy    Past Surgical History:  Procedure Laterality Date  . COLONOSCOPY    . DILATION AND CURETTAGE OF UTERUS  2004?  . ESOPHAGOGASTRODUODENOSCOPY (EGD) WITH PROPOFOL N/A 04/11/2016   Procedure: ESOPHAGOGASTRODUODENOSCOPY (EGD) WITH PROPOFOL;  Surgeon: Clarene Essex, MD;  Location: [facility];  Service: Endoscopy;  Laterality: N/A;  . ESOPHAGOGASTRODUODENOSCOPY (EGD) WITH PROPOFOL N/A 07/08/2016   Procedure: ESOPHAGOGASTRODUODENOSCOPY (EGD) WITH PROPOFOL;  Surgeon: Laurence Spates, MD;  Location: [facility];  Service: Endoscopy;  Laterality: N/A;  . ESOPHAGOGASTRODUODENOSCOPY (EGD) WITH PROPOFOL N/A 03/03/2017   Procedure: ESOPHAGOGASTRODUODENOSCOPY (EGD) WITH PROPOFOL;  Surgeon: Laurence Spates, MD;  Location: [facility];  Service: Endoscopy;  Laterality: N/A;  . IR GENERIC HISTORICAL  05/15/2016   IR RADIOLOGIST EVAL & MGMT 05/15/2016 Corrie Mckusick, DO [facility]  . IR GENERIC HISTORICAL  01/14/2017   IR US GUIDE VASC ACCESS RIGHT 01/14/2017 Greggory Keen, MD [facility]  . IR GENERIC HISTORICAL  01/14/2017   IR VENOGRAM HEPATIC W HEMODYNAMIC EVALUATION 01/14/2017 Greggory Keen, MD [facility]  . MASTECTOMY  06/02/12   right breast  . PORT-A-CATH REMOVAL    . PORTACATH PLACEMENT  2011   left side  . RADIOLOGY WITH ANESTHESIA N/A 04/13/2016  Procedure: RADIOLOGY WITH ANESTHESIA;  Surgeon: Corrie Mckusick, DO;  Location: [facility];  Service: Anesthesiology;  Laterality: N/A;    Allergies: Patient has no known allergies.  Medications: Prior to Admission medications   Medication Sig Start Date End Date Taking? Authorizing Provider  Calcium Carb-Cholecalciferol (CALCIUM 600 + D PO) Take 1 tablet by mouth daily.   Yes [provider]  Cholecalciferol (VITAMIN D3) 5000 units CAPS Take 5,000 Units by  mouth daily.   Yes [provider]  denosumab (PROLIA) 60 MG/ML SOLN injection Inject 60 mg into the skin every 6 (six) months. Administer in upper arm, thigh, or abdomen   Yes [provider]  letrozole (FEMARA) 2.5 MG tablet Take 1 tablet (2.5 mg total) by mouth daily. 12/03/16  Yes Magrinat, Virgie Dad, MD  Magnesium 500 MG TABS Take 500 mg by mouth daily.   Yes [provider]  venlafaxine XR (EFFEXOR-XR) 150 MG 24 hr capsule Take 1 capsule (150 mg total) by mouth daily with breakfast. 12/03/16  Yes Magrinat, Virgie Dad, MD     Family History  Problem Relation Age of Onset  . COPD Mother   . AAA (abdominal aortic aneurysm) Mother   . COPD Father   . Cancer Neg Hx     Social History   Social History  . Marital status: Married    Spouse name: N/A  . Number of children: N/A  . Years of education: N/A   Social History Main Topics  . Smoking status: Never Smoker  . Smokeless tobacco: Never Used  . Alcohol use No  . Drug use: No  . Sexual activity: Yes   Other Topics Concern  . Not on file   Social History Narrative  . No narrative on file    Review of Systems: A 12 point ROS discussed and pertinent positives are indicated in the HPI above.  All other systems are negative.  Review of Systems  Vital Signs: BP (!) 154/85   Pulse 96   Temp 98 F (36.7 C) (Oral)   Resp 14   Ht 5\' 4"  (1.626 m)   Wt 137 lb (62.1 kg)   SpO2 97%   BMI 23.52 kg/m   Physical Exam  Mallampati Score:     Imaging: Dg Chest 2 View  Result Date: 05/19/2017 CLINICAL DATA:  The history of metastatic breast cancer. EXAM: CHEST  2 VIEW COMPARISON:  11/07/2016 FINDINGS: The cardiac silhouette, mediastinal and hilar contours are within normal limits and stable. There are chronic lung changes with emphysema and pulmonary scarring. No acute overlying pulmonary process. No definite pulmonary lesions. The bony thorax is intact. Stable left upper quadrant cord related to prior  BRTO. IMPRESSION: Chronic lung changes but no acute pulmonary findings. Electronically Signed   By: Marijo Sanes M.D.   On: 05/19/2017 16:14    Labs:  CBC:  Recent Labs  11/07/16 0948 12/02/16 1235 01/14/17 0909 05/19/17 1255  WBC 5.4 3.2* 5.1 4.4  HGB 10.1* 9.5* 10.7* 8.8*  HCT 33.5* 31.9* 35.7* 30.0*  PLT 90* 80* 88* 128*    COAGS:  Recent Labs  12/02/16 1235 01/14/17 0909  INR 1.03 1.04  APTT  --  26    BMP:  Recent Labs  11/07/16 0949 12/02/16 1235 01/14/17 0909 05/19/17 1256  NA 140 143 139 141  K 4.1 4.0 3.9 3.7  CL  --  115* 108  --   CO2 22 20* 22 19*  GLUCOSE 99 95 95 103  BUN 14.9 19 18  19.8  CALCIUM 9.2 9.0 9.1 8.9  CREATININE 1.1 0.96 1.07* 1.1  GFRNONAA  --  >60 54*  --   GFRAA  --  >60 >60  --     LIVER FUNCTION TESTS:  Recent Labs  11/07/16 0949 12/02/16 1235 05/19/17 1256  BILITOT 0.67 0.3 0.50  AST 25 34 19  ALT 25 24 21   ALKPHOS 87 67 63  PROT 7.1 6.4* 7.2  ALBUMIN 3.7 3.7 4.2    TUMOR MARKERS: No results for input(s): AFPTM, CEA, CA199, CHROMGRNA in the last 8760 hours.  Assessment and Plan:  Ms Martello is a 61 year old female SP BRTO for bleeding gastric varices, with no recurrence over a year.   She was treated over 14 months ago, and CT surveillance and endoscopic surveillance during that time, as recent as today, shows no persisting GV's.    At this point, it is reasonable to extend the surveillance period.  We discussed annual follow up, and we will see her back in about 12 months.  She tells me that she will also see her GI team at that time, and I imagine she will have repeat endoscopy.  We can stagger a CT BRTO protocol with any future endoscopy appointment +/- 3 months.    Additionally, I encouraged her to look into taking a log of her own SBP at home, as she tells me it is typically elevated during her office appointments, today again in the 150's.  She is currently not treated for HTN.    She understands, and  agree with our plan.   Plan: - Repeat office visit in 12 months.  - Plan for a CT with contrast, BRTO protocol, which can be performed next year staggered with her endoscopy either 3 months before or after. - I have advised her to observe all of her upcoming provider appointments.   Electronically Signed: Corrie Mckusick 06/12/2017, 2:56 PM   I spent a total of    25 Minutes in face to face in clinical consultation, greater than 50% of which was counseling/coordinating care for portal HTN, gastric varices, SP BRTO for bleeding gastric varices

## 2017-06-12 NOTE — Patient Instructions (Signed)

## 2017-06-19 ENCOUNTER — Other Ambulatory Visit: Payer: Self-pay | Admitting: Oncology

## 2017-06-19 ENCOUNTER — Ambulatory Visit (HOSPITAL_BASED_OUTPATIENT_CLINIC_OR_DEPARTMENT_OTHER): Payer: BLUE CROSS/BLUE SHIELD

## 2017-06-19 VITALS — BP 129/76 | HR 79 | Temp 97.9°F | Resp 16

## 2017-06-19 DIAGNOSIS — C50411 Malignant neoplasm of upper-outer quadrant of right female breast: Secondary | ICD-10-CM

## 2017-06-19 DIAGNOSIS — C50912 Malignant neoplasm of unspecified site of left female breast: Secondary | ICD-10-CM | POA: Diagnosis not present

## 2017-06-19 DIAGNOSIS — M810 Age-related osteoporosis without current pathological fracture: Secondary | ICD-10-CM

## 2017-06-19 DIAGNOSIS — D509 Iron deficiency anemia, unspecified: Secondary | ICD-10-CM

## 2017-06-19 DIAGNOSIS — C7802 Secondary malignant neoplasm of left lung: Secondary | ICD-10-CM

## 2017-06-19 DIAGNOSIS — Z170 Estrogen receptor positive status [ER+]: Secondary | ICD-10-CM

## 2017-06-19 LAB — COMPREHENSIVE METABOLIC PANEL
ALT: 24 U/L (ref 0–55)
ANION GAP: 11 meq/L (ref 3–11)
AST: 23 U/L (ref 5–34)
Albumin: 4.2 g/dL (ref 3.5–5.0)
Alkaline Phosphatase: 69 U/L (ref 40–150)
BUN: 16.2 mg/dL (ref 7.0–26.0)
CALCIUM: 10.1 mg/dL (ref 8.4–10.4)
CHLORIDE: 109 meq/L (ref 98–109)
CO2: 21 meq/L — AB (ref 22–29)
Creatinine: 1.2 mg/dL — ABNORMAL HIGH (ref 0.6–1.1)
EGFR: 50 mL/min/{1.73_m2} — ABNORMAL LOW (ref >90)
Glucose: 91 mg/dl (ref 70–140)
POTASSIUM: 3.9 meq/L (ref 3.5–5.1)
Sodium: 141 mEq/L (ref 136–145)
Total Bilirubin: 0.55 mg/dL (ref 0.20–1.20)
Total Protein: 7.3 g/dL (ref 6.4–8.3)

## 2017-06-19 LAB — CBC WITH DIFFERENTIAL/PLATELET
BASO%: 0.2 % (ref 0.0–2.0)
Basophils Absolute: 0 10*3/uL (ref 0.0–0.1)
EOS ABS: 0 10*3/uL (ref 0.0–0.5)
EOS%: 0 % (ref 0.0–7.0)
HEMATOCRIT: 38.5 % (ref 34.8–46.6)
HGB: 11.4 g/dL — ABNORMAL LOW (ref 11.6–15.9)
LYMPH%: 12.7 % — AB (ref 14.0–49.7)
MCH: 20.5 pg — AB (ref 25.1–34.0)
MCHC: 29.6 g/dL — AB (ref 31.5–36.0)
MCV: 69.4 fL — ABNORMAL LOW (ref 79.5–101.0)
MONO#: 0.5 10*3/uL (ref 0.1–0.9)
MONO%: 7.2 % (ref 0.0–14.0)
NEUT%: 79.9 % — AB (ref 38.4–76.8)
NEUTROS ABS: 5.2 10*3/uL (ref 1.5–6.5)
PLATELETS: 111 10*3/uL — AB (ref 145–400)
RBC: 5.55 10*6/uL — AB (ref 3.70–5.45)
RDW: 24.4 % — ABNORMAL HIGH (ref 11.2–14.5)
WBC: 6.5 10*3/uL (ref 3.9–10.3)
lymph#: 0.8 10*3/uL — ABNORMAL LOW (ref 0.9–3.3)
nRBC: 0 % (ref 0–0)

## 2017-06-19 MED ORDER — SODIUM CHLORIDE 0.9 % IV SOLN
510.0000 mg | Freq: Once | INTRAVENOUS | Status: AC
  Administered 2017-06-19: 510 mg via INTRAVENOUS
  Filled 2017-06-19: qty 17

## 2017-06-19 NOTE — Patient Instructions (Signed)

## 2017-06-19 NOTE — Progress Notes (Unsigned)
Nicole [name] has a persistent cough. I am going to obtain a chest CT to evaluate this further. We will also obtain a see a 2729 today.

## 2017-06-20 LAB — CANCER ANTIGEN 27.29: CA 27.29: 38 U/mL (ref 0.0–38.6)

## 2017-06-21 ENCOUNTER — Telehealth: Payer: Self-pay | Admitting: Oncology

## 2017-06-21 NOTE — Telephone Encounter (Signed)
swpt to inform of 6/29 appt per sch msg. Pt stated she will be leaving town after 6/27 and will not be back until 7/9. Gave pt next available appt 7/11 at 1430

## 2017-06-25 ENCOUNTER — Ambulatory Visit (HOSPITAL_COMMUNITY)
Admission: RE | Admit: 2017-06-25 | Discharge: 2017-06-25 | Disposition: A | Discharge status: Home / Self Care | Payer: BLUE CROSS/BLUE SHIELD | Source: Ambulatory Visit | Attending: Oncology | Admitting: Oncology

## 2017-06-25 ENCOUNTER — Encounter (HOSPITAL_COMMUNITY): Payer: Self-pay

## 2017-06-25 DIAGNOSIS — C7802 Secondary malignant neoplasm of left lung: Secondary | ICD-10-CM | POA: Diagnosis not present

## 2017-06-25 DIAGNOSIS — Z9011 Acquired absence of right breast and nipple: Secondary | ICD-10-CM | POA: Insufficient documentation

## 2017-06-25 DIAGNOSIS — K7460 Unspecified cirrhosis of liver: Secondary | ICD-10-CM | POA: Insufficient documentation

## 2017-06-25 DIAGNOSIS — Z170 Estrogen receptor positive status [ER+]: Secondary | ICD-10-CM | POA: Insufficient documentation

## 2017-06-25 DIAGNOSIS — R918 Other nonspecific abnormal finding of lung field: Secondary | ICD-10-CM | POA: Diagnosis not present

## 2017-06-25 DIAGNOSIS — Z923 Personal history of irradiation: Secondary | ICD-10-CM | POA: Insufficient documentation

## 2017-06-25 DIAGNOSIS — C50911 Malignant neoplasm of unspecified site of right female breast: Secondary | ICD-10-CM | POA: Diagnosis not present

## 2017-06-25 DIAGNOSIS — R161 Splenomegaly, not elsewhere classified: Secondary | ICD-10-CM | POA: Diagnosis not present

## 2017-06-25 DIAGNOSIS — J439 Emphysema, unspecified: Secondary | ICD-10-CM | POA: Insufficient documentation

## 2017-06-25 DIAGNOSIS — I700 Atherosclerosis of aorta: Secondary | ICD-10-CM | POA: Insufficient documentation

## 2017-06-25 DIAGNOSIS — C50411 Malignant neoplasm of upper-outer quadrant of right female breast: Secondary | ICD-10-CM | POA: Insufficient documentation

## 2017-06-25 DIAGNOSIS — C50912 Malignant neoplasm of unspecified site of left female breast: Secondary | ICD-10-CM

## 2017-06-25 DIAGNOSIS — M899 Disorder of bone, unspecified: Secondary | ICD-10-CM | POA: Insufficient documentation

## 2017-06-25 MED ORDER — IOPAMIDOL (ISOVUE-300) INJECTION 61%
75.0000 mL | Freq: Once | INTRAVENOUS | Status: AC | PRN
  Administered 2017-06-25: 75 mL via INTRAVENOUS

## 2017-06-25 MED ORDER — IOPAMIDOL (ISOVUE-300) INJECTION 61%
INTRAVENOUS | Status: AC
  Administered 2017-06-25: 75 mL via INTRAVENOUS
  Filled 2017-06-25: qty 75

## 2017-06-27 ENCOUNTER — Ambulatory Visit: Payer: BLUE CROSS/BLUE SHIELD | Admitting: Oncology

## 2017-06-29 ENCOUNTER — Other Ambulatory Visit: Payer: Self-pay | Admitting: Oncology

## 2017-07-07 ENCOUNTER — Other Ambulatory Visit: Payer: Self-pay | Admitting: *Deleted

## 2017-07-07 DIAGNOSIS — K766 Portal hypertension: Secondary | ICD-10-CM | POA: Diagnosis not present

## 2017-07-09 ENCOUNTER — Ambulatory Visit: Payer: BLUE CROSS/BLUE SHIELD | Admitting: Oncology

## 2017-08-30 DIAGNOSIS — K920 Hematemesis: Secondary | ICD-10-CM

## 2017-08-30 HISTORY — DX: Hematemesis: K92.0

## 2017-09-04 ENCOUNTER — Encounter (HOSPITAL_COMMUNITY)
Admission: EM | Disposition: A | Discharge status: Home / Self Care | Payer: Self-pay | Source: Home / Self Care | Attending: Internal Medicine

## 2017-09-04 ENCOUNTER — Encounter (HOSPITAL_COMMUNITY): Payer: Self-pay | Admitting: *Deleted

## 2017-09-04 ENCOUNTER — Inpatient Hospital Stay (HOSPITAL_COMMUNITY): Payer: BLUE CROSS/BLUE SHIELD | Admitting: Anesthesiology

## 2017-09-04 ENCOUNTER — Inpatient Hospital Stay (HOSPITAL_COMMUNITY)
Admission: EM | Admit: 2017-09-04 | Discharge: 2017-09-07 | DRG: 299 | Disposition: A | Discharge status: Home / Self Care | Payer: BLUE CROSS/BLUE SHIELD | Attending: Internal Medicine | Admitting: Internal Medicine

## 2017-09-04 DIAGNOSIS — K766 Portal hypertension: Secondary | ICD-10-CM | POA: Diagnosis not present

## 2017-09-04 DIAGNOSIS — F329 Major depressive disorder, single episode, unspecified: Secondary | ICD-10-CM | POA: Diagnosis present

## 2017-09-04 DIAGNOSIS — Z9221 Personal history of antineoplastic chemotherapy: Secondary | ICD-10-CM

## 2017-09-04 DIAGNOSIS — I864 Gastric varices: Secondary | ICD-10-CM | POA: Diagnosis not present

## 2017-09-04 DIAGNOSIS — D696 Thrombocytopenia, unspecified: Secondary | ICD-10-CM | POA: Diagnosis present

## 2017-09-04 DIAGNOSIS — Z923 Personal history of irradiation: Secondary | ICD-10-CM | POA: Diagnosis not present

## 2017-09-04 DIAGNOSIS — C7800 Secondary malignant neoplasm of unspecified lung: Secondary | ICD-10-CM | POA: Diagnosis present

## 2017-09-04 DIAGNOSIS — D508 Other iron deficiency anemias: Secondary | ICD-10-CM | POA: Diagnosis not present

## 2017-09-04 DIAGNOSIS — K7460 Unspecified cirrhosis of liver: Secondary | ICD-10-CM | POA: Diagnosis present

## 2017-09-04 DIAGNOSIS — K921 Melena: Secondary | ICD-10-CM | POA: Diagnosis not present

## 2017-09-04 DIAGNOSIS — I8511 Secondary esophageal varices with bleeding: Secondary | ICD-10-CM | POA: Diagnosis not present

## 2017-09-04 DIAGNOSIS — I8501 Esophageal varices with bleeding: Secondary | ICD-10-CM | POA: Diagnosis not present

## 2017-09-04 DIAGNOSIS — R404 Transient alteration of awareness: Secondary | ICD-10-CM | POA: Diagnosis not present

## 2017-09-04 DIAGNOSIS — R9431 Abnormal electrocardiogram [ECG] [EKG]: Secondary | ICD-10-CM | POA: Diagnosis not present

## 2017-09-04 DIAGNOSIS — Z79811 Long term (current) use of aromatase inhibitors: Secondary | ICD-10-CM | POA: Diagnosis not present

## 2017-09-04 DIAGNOSIS — R531 Weakness: Secondary | ICD-10-CM | POA: Diagnosis not present

## 2017-09-04 DIAGNOSIS — E876 Hypokalemia: Secondary | ICD-10-CM | POA: Diagnosis not present

## 2017-09-04 DIAGNOSIS — C7802 Secondary malignant neoplasm of left lung: Secondary | ICD-10-CM | POA: Diagnosis not present

## 2017-09-04 DIAGNOSIS — I8500 Esophageal varices without bleeding: Secondary | ICD-10-CM | POA: Diagnosis present

## 2017-09-04 DIAGNOSIS — Z9011 Acquired absence of right breast and nipple: Secondary | ICD-10-CM

## 2017-09-04 DIAGNOSIS — K922 Gastrointestinal hemorrhage, unspecified: Secondary | ICD-10-CM | POA: Diagnosis not present

## 2017-09-04 DIAGNOSIS — K3189 Other diseases of stomach and duodenum: Secondary | ICD-10-CM | POA: Diagnosis not present

## 2017-09-04 DIAGNOSIS — D509 Iron deficiency anemia, unspecified: Secondary | ICD-10-CM | POA: Diagnosis not present

## 2017-09-04 DIAGNOSIS — K920 Hematemesis: Secondary | ICD-10-CM | POA: Diagnosis present

## 2017-09-04 DIAGNOSIS — I8510 Secondary esophageal varices without bleeding: Secondary | ICD-10-CM | POA: Diagnosis not present

## 2017-09-04 DIAGNOSIS — C50912 Malignant neoplasm of unspecified site of left female breast: Secondary | ICD-10-CM | POA: Diagnosis not present

## 2017-09-04 DIAGNOSIS — C50919 Malignant neoplasm of unspecified site of unspecified female breast: Secondary | ICD-10-CM | POA: Diagnosis present

## 2017-09-04 DIAGNOSIS — Z87442 Personal history of urinary calculi: Secondary | ICD-10-CM

## 2017-09-04 DIAGNOSIS — D62 Acute posthemorrhagic anemia: Secondary | ICD-10-CM | POA: Diagnosis present

## 2017-09-04 HISTORY — DX: Hematemesis: K92.0

## 2017-09-04 HISTORY — DX: Unspecified cirrhosis of liver: K74.60

## 2017-09-04 HISTORY — PX: ESOPHAGOGASTRODUODENOSCOPY (EGD) WITH PROPOFOL: SHX5813

## 2017-09-04 HISTORY — DX: Portal hypertension: K76.6

## 2017-09-04 HISTORY — PX: ESOPHAGEAL BANDING: SHX5518

## 2017-09-04 LAB — PROTIME-INR
INR: 1.17
Prothrombin Time: 14.8 seconds (ref 11.4–15.2)

## 2017-09-04 LAB — BASIC METABOLIC PANEL
ANION GAP: 11 (ref 5–15)
BUN: 51 mg/dL — ABNORMAL HIGH (ref 6–20)
CHLORIDE: 113 mmol/L — AB (ref 101–111)
CO2: 17 mmol/L — ABNORMAL LOW (ref 22–32)
CREATININE: 1 mg/dL (ref 0.44–1.00)
Calcium: 7.9 mg/dL — ABNORMAL LOW (ref 8.9–10.3)
GFR calc Af Amer: >60 mL/min (ref >60)
GFR calc non Af Amer: 59 mL/min — ABNORMAL LOW (ref >60)
Glucose, Bld: 134 mg/dL — ABNORMAL HIGH (ref 65–99)
POTASSIUM: 3.4 mmol/L — AB (ref 3.5–5.1)
SODIUM: 141 mmol/L (ref 135–145)

## 2017-09-04 LAB — CBC
HCT: 30 % — ABNORMAL LOW (ref 36.0–46.0)
Hemoglobin: 9.9 g/dL — ABNORMAL LOW (ref 12.0–15.0)
MCH: 27.9 pg (ref 26.0–34.0)
MCHC: 33 g/dL (ref 30.0–36.0)
MCV: 84.5 fL (ref 78.0–100.0)
PLATELETS: 106 10*3/uL — AB (ref 150–400)
RBC: 3.55 MIL/uL — AB (ref 3.87–5.11)
RDW: 19 % — AB (ref 11.5–15.5)
WBC: 8.9 10*3/uL (ref 4.0–10.5)

## 2017-09-04 LAB — CBC WITH DIFFERENTIAL/PLATELET
BASOS PCT: 0 %
Basophils Absolute: 0 10*3/uL (ref 0.0–0.1)
Eosinophils Absolute: 0 10*3/uL (ref 0.0–0.7)
Eosinophils Relative: 0 %
HEMATOCRIT: 33.2 % — AB (ref 36.0–46.0)
HEMOGLOBIN: 11.5 g/dL — AB (ref 12.0–15.0)
Lymphocytes Relative: 14 %
Lymphs Abs: 1.8 10*3/uL (ref 0.7–4.0)
MCH: 28.3 pg (ref 26.0–34.0)
MCHC: 34.6 g/dL (ref 30.0–36.0)
MCV: 81.6 fL (ref 78.0–100.0)
MONOS PCT: 5 %
Monocytes Absolute: 0.7 10*3/uL (ref 0.1–1.0)
NEUTROS ABS: 10.5 10*3/uL — AB (ref 1.7–7.7)
NEUTROS PCT: 81 %
Platelets: 141 10*3/uL — ABNORMAL LOW (ref 150–400)
RBC: 4.07 MIL/uL (ref 3.87–5.11)
RDW: 19.1 % — ABNORMAL HIGH (ref 11.5–15.5)
WBC: 13 10*3/uL — AB (ref 4.0–10.5)

## 2017-09-04 LAB — HEPATIC FUNCTION PANEL
ALBUMIN: 3.4 g/dL — AB (ref 3.5–5.0)
ALT: 23 U/L (ref 14–54)
AST: 25 U/L (ref 15–41)
Alkaline Phosphatase: 42 U/L (ref 38–126)
BILIRUBIN TOTAL: 0.9 mg/dL (ref 0.3–1.2)
Bilirubin, Direct: 0.2 mg/dL (ref 0.1–0.5)
Indirect Bilirubin: 0.7 mg/dL (ref 0.3–0.9)
TOTAL PROTEIN: 5.5 g/dL — AB (ref 6.5–8.1)

## 2017-09-04 LAB — POC OCCULT BLOOD, ED: Fecal Occult Bld: POSITIVE — AB

## 2017-09-04 LAB — MRSA PCR SCREENING: MRSA BY PCR: NEGATIVE

## 2017-09-04 SURGERY — ESOPHAGOGASTRODUODENOSCOPY (EGD) WITH PROPOFOL
Anesthesia: Monitor Anesthesia Care

## 2017-09-04 MED ORDER — ONDANSETRON HCL 4 MG PO TABS: 4.0000 mg | ORAL_TABLET | Freq: Four times a day (QID) | ORAL | Status: DC | PRN

## 2017-09-04 MED ORDER — LIDOCAINE HCL (CARDIAC) 20 MG/ML IV SOLN
INTRAVENOUS | Status: DC | PRN
  Administered 2017-09-04: 60 mg via INTRATRACHEAL

## 2017-09-04 MED ORDER — SODIUM CHLORIDE 0.9 % IV SOLN
INTRAVENOUS | Status: DC
  Administered 2017-09-04 (×2): via INTRAVENOUS

## 2017-09-04 MED ORDER — SODIUM CHLORIDE 0.9 % IV SOLN
8.0000 mg/h | INTRAVENOUS | Status: DC
  Filled 2017-09-04 (×2): qty 80

## 2017-09-04 MED ORDER — LETROZOLE 2.5 MG PO TABS
2.5000 mg | ORAL_TABLET | Freq: Every day | ORAL | Status: DC
  Administered 2017-09-04 – 2017-09-06 (×3): 2.5 mg via ORAL
  Filled 2017-09-04 (×5): qty 1

## 2017-09-04 MED ORDER — ACETAMINOPHEN 650 MG RE SUPP: 650.0000 mg | Freq: Four times a day (QID) | RECTAL | Status: DC | PRN

## 2017-09-04 MED ORDER — SODIUM CHLORIDE 0.9 % IV SOLN
INTRAVENOUS | Status: DC | PRN
  Administered 2017-09-04: 10:00:00 via INTRAVENOUS

## 2017-09-04 MED ORDER — PANTOPRAZOLE SODIUM 40 MG IV SOLR: 40.0000 mg | Freq: Two times a day (BID) | INTRAVENOUS | Status: DC

## 2017-09-04 MED ORDER — SODIUM CHLORIDE 0.9 % IV BOLUS (SEPSIS)
500.0000 mL | Freq: Once | INTRAVENOUS | Status: AC
  Administered 2017-09-04: 500 mL via INTRAVENOUS

## 2017-09-04 MED ORDER — ACETAMINOPHEN 325 MG PO TABS: 650.0000 mg | ORAL_TABLET | Freq: Four times a day (QID) | ORAL | Status: DC | PRN

## 2017-09-04 MED ORDER — PROPOFOL 10 MG/ML IV BOLUS
INTRAVENOUS | Status: DC | PRN
  Administered 2017-09-04: 20 mg via INTRAVENOUS

## 2017-09-04 MED ORDER — SODIUM CHLORIDE 0.9 % IV SOLN
80.0000 mg | Freq: Once | INTRAVENOUS | Status: AC
  Administered 2017-09-04: 80 mg via INTRAVENOUS
  Filled 2017-09-04: qty 80

## 2017-09-04 MED ORDER — DEXTROSE 5 % IV SOLN
1.0000 g | Freq: Once | INTRAVENOUS | Status: AC
  Administered 2017-09-04: 1 g via INTRAVENOUS
  Filled 2017-09-04: qty 10

## 2017-09-04 MED ORDER — SODIUM CHLORIDE 0.9 % IV SOLN
50.0000 ug/h | INTRAVENOUS | Status: DC
  Administered 2017-09-04 – 2017-09-07 (×7): 50 ug/h via INTRAVENOUS
  Filled 2017-09-04 (×15): qty 1

## 2017-09-04 MED ORDER — ONDANSETRON HCL 4 MG/2ML IJ SOLN
4.0000 mg | Freq: Four times a day (QID) | INTRAMUSCULAR | Status: DC | PRN
  Administered 2017-09-04: 4 mg via INTRAVENOUS

## 2017-09-04 MED ORDER — ONDANSETRON HCL 4 MG/2ML IJ SOLN
INTRAMUSCULAR | Status: AC
  Filled 2017-09-04: qty 2

## 2017-09-04 MED ORDER — OCTREOTIDE LOAD VIA INFUSION
50.0000 ug | Freq: Once | INTRAVENOUS | Status: AC
  Administered 2017-09-04: 50 ug via INTRAVENOUS
  Filled 2017-09-04: qty 25

## 2017-09-04 MED ORDER — PROPOFOL 500 MG/50ML IV EMUL
INTRAVENOUS | Status: DC | PRN
  Administered 2017-09-04: 150 ug/kg/min via INTRAVENOUS

## 2017-09-04 NOTE — Progress Notes (Signed)
Pt arrived onto unit. A&Ox4. No c/o pain. Pt instructed to call for help when needed and verbalizes understanding.

## 2017-09-04 NOTE — Anesthesia Preprocedure Evaluation (Addendum)
Anesthesia Evaluation  Patient identified by MRN, date of birth, ID band Patient awake    Reviewed: Allergy & Precautions, NPO status , Patient's Chart, lab work & pertinent test results  Airway Mallampati: II  TM Distance: >3 FB Neck ROM: Full    Dental no notable dental hx. (+) Teeth Intact   Pulmonary  Lung mets   Pulmonary exam normal        Cardiovascular + Peripheral Vascular Disease  Normal cardiovascular exam - Systolic murmurs ECG: SR, rate 99   Neuro/Psych  Headaches, Depression    GI/Hepatic (+) Cirrhosis   Esophageal Varices    , Portal hypertension  IBD (inflammatory bowel disease)   Endo/Other    Renal/GU      Musculoskeletal   Abdominal   Peds  Hematology  (+) anemia ,   Anesthesia Other Findings Breast CA w/ mets  Reproductive/Obstetrics                            Anesthesia Physical  Anesthesia Plan  ASA: III  Anesthesia Plan: MAC   Post-op Pain Management:    Induction: Intravenous  PONV Risk Score and Plan: Propofol infusion and Treatment may vary due to age or medical condition  Airway Management Planned: Natural Airway  Additional Equipment:   Intra-op Plan:   Post-operative Plan:   Informed Consent: I have reviewed the patients History and Physical, chart, labs and discussed the procedure including the risks, benefits and alternatives for the proposed anesthesia with the patient or authorized representative who has indicated his/her understanding and acceptance.   Dental advisory given  Plan Discussed with: CRNA  Anesthesia Plan Comments:         Anesthesia Quick Evaluation

## 2017-09-04 NOTE — ED Triage Notes (Signed)
Patient presents to ed via [facility] states she didn't feel well yest c/o weakness and abd. Pain states she also had some diarrhea. Tonight was asleep on the couch and woke up vomited large amt of dark emesis and also had diarrhea of which was dark as well. Denies pain states she just feels very weak. [name] Hx. Of esophageal varices and abd. Varices of which she had a procedure 1 year ago to correct and hasn't had problems since.

## 2017-09-04 NOTE — Brief Op Note (Signed)
09/04/2017  10:39 AM  PATIENT:  Nicole [name]  61 y.o. female  PRE-OPERATIVE DIAGNOSIS:  GI bleed  POST-OPERATIVE DIAGNOSIS:  varices s/p  banding x 7, portal gastrohypertension  PROCEDURE:  Procedure(s): ESOPHAGOGASTRODUODENOSCOPY (EGD) WITH PROPOFOL (N/A) ESOPHAGEAL BANDING  SURGEON:  Surgeon(s) and Role:    [name], MD - Primary  PHYSICIAN ASSISTANT:   ASSISTANTS: none   ANESTHESIA:   MAC  EBL:  Total I/O In: 600 [I.V.:600] Out: -   BLOOD ADMINISTERED:none  DRAINS: none   LOCAL MEDICATIONS USED:  NONE  SPECIMEN:  No Specimen  DISPOSITION OF SPECIMEN:  N/A  COUNTS:  YES  TOURNIQUET:  * No tourniquets in log *  DICTATION: .Dragon Dictation  PLAN OF CARE: Admit to inpatient   PATIENT DISPOSITION:  PACU - hemodynamically stable.   Delay start of Pharmacological VTE agent (>24hrs) due to surgical blood loss or risk of bleeding: no

## 2017-09-04 NOTE — Transfer of Care (Signed)
Immediate Anesthesia Transfer of Care Note  Patient: Nicole [name]  Procedure(s) Performed: Procedure(s): ESOPHAGOGASTRODUODENOSCOPY (EGD) WITH PROPOFOL (N/A) ESOPHAGEAL BANDING  Patient Location: Endoscopy Unit  Anesthesia Type:MAC  Level of Consciousness: awake, alert , patient cooperative and responds to stimulation  Airway & Oxygen Therapy: Patient Spontanous Breathing and Patient connected to nasal cannula oxygen  Post-op Assessment: Report given to RN and Post -op Vital signs reviewed and stable  Post vital signs: Reviewed and stable  Last Vitals:  Vitals:   09/04/17 0855 09/04/17 1043  BP: 108/65 (!) 126/98  Pulse: 95 (!) 104  Resp: 14 15  Temp: 36.9 C 36.8 C  SpO2: 99% 100%    Last Pain:  Vitals:   09/04/17 1043  TempSrc: Oral         Complications: No apparent anesthesia complications

## 2017-09-04 NOTE — H&P (Signed)
Nicole [name] is an 61 y.o. female.   Chief Complaint: Vomiting blood and one episode of black tarry stool HPI: 61 year old Caucasian female presents to the ER with 1 episode of vomiting bright red blood followed by 1 episode of black tarry stools. EGD from 03/03/17 showed grade 2 varices, bands were not placed and there were no evidence of gastric varices on retroflexion.  Patient states she does not have cirrhosis of liver but rather has portal hypertension, related to radiation and chemotherapy for breast cancer since 2011. Status post coil assisted balloon occluded retrograde trans venous obliteration (BRTO) for treatment of bleeding gastric varices. Colonoscopy from 2016 showed few diverticulosis otherwise was unremarkable.  Past Medical History:  Diagnosis Date  . Blood transfusion 2012  . Breast CA ([place])    breast ca dx 06/2010, stage 4, right , Lung Metasis.Surgery, Chemo, Radiation  . Clot    jugular  ?2015  . Depression   . Esophageal varices ([place])   . Headache    Mirgraine- rare since menopause  . Heart murmur    mild, no cardiologist, from birth  . History of kidney stones    no pain- shows in CT- small 1 mm bilaterally- kidney  . IBD (inflammatory bowel disease)    resolved  . Idiopathic cirrhosis ([place])   . Peripheral vascular disease ([place])    Patient does not know anything about this.  . Portal hypertension ([place])   . Radiation 07/21/12-09/07/12   5940 cGy    Past Surgical History:  Procedure Laterality Date  . COLONOSCOPY    . DILATION AND CURETTAGE OF UTERUS  2004?  . ESOPHAGOGASTRODUODENOSCOPY (EGD) WITH PROPOFOL N/A 04/11/2016   Procedure: ESOPHAGOGASTRODUODENOSCOPY (EGD) WITH PROPOFOL;  Surgeon: Clarene Essex, MD;  [facility];  Service: Endoscopy;  Laterality: N/A;  . ESOPHAGOGASTRODUODENOSCOPY (EGD) WITH PROPOFOL N/A 07/08/2016   Procedure: ESOPHAGOGASTRODUODENOSCOPY (EGD) WITH PROPOFOL;  Surgeon: Laurence Spates, MD;  Location: [facility];  Service:  Endoscopy;  Laterality: N/A;  . ESOPHAGOGASTRODUODENOSCOPY (EGD) WITH PROPOFOL N/A 03/03/2017   Procedure: ESOPHAGOGASTRODUODENOSCOPY (EGD) WITH PROPOFOL;  Surgeon: Laurence Spates, MD;  Location: [facility];  Service: Endoscopy;  Laterality: N/A;  . IR GENERIC HISTORICAL  05/15/2016   IR RADIOLOGIST EVAL & MGMT 05/15/2016 Corrie Mckusick, DO [facility]  . IR GENERIC HISTORICAL  01/14/2017   IR US GUIDE VASC ACCESS RIGHT 01/14/2017 Greggory Keen, MD [facility]  . IR GENERIC HISTORICAL  01/14/2017   IR VENOGRAM HEPATIC W HEMODYNAMIC EVALUATION 01/14/2017 Greggory Keen, MD [facility]  . MASTECTOMY  06/02/12   right breast  . PORT-A-CATH REMOVAL    . PORTACATH PLACEMENT  2011   left side  . RADIOLOGY WITH ANESTHESIA N/A 04/13/2016   Procedure: RADIOLOGY WITH ANESTHESIA;  Surgeon: Corrie Mckusick, DO;  Location: [facility];  Service: Anesthesiology;  Laterality: N/A;    Family History  Problem Relation Age of Onset  . COPD Mother   . AAA (abdominal aortic aneurysm) Mother   . COPD Father   . Cancer Neg Hx    Social History:  reports that she has never smoked. She has never used smokeless tobacco. She reports that she does not drink alcohol or use drugs.  Allergies: No Known Allergies  Medications Prior to Admission  Medication Sig Dispense Refill  . Calcium Carb-Cholecalciferol (CALCIUM 600 + D PO) Take 1 tablet by mouth daily.    . Cholecalciferol (VITAMIN D3) 5000 units CAPS Take 5,000 Units by mouth daily.    [name] denosumab (  PROLIA) 60 MG/ML SOLN injection Inject 60 mg into the skin every 6 (six) months. Administer in upper arm, thigh, or abdomen    . letrozole (FEMARA) 2.5 MG tablet Take 1 tablet (2.5 mg total) by mouth daily. 90 tablet 0  . Magnesium 500 MG TABS Take 500 mg by mouth daily.    [name] venlafaxine XR (EFFEXOR-XR) 150 MG 24 hr capsule Take 1 capsule (150 mg total) by mouth daily with breakfast. 30 capsule 3  . VITAMIN E PO Take 1 capsule by mouth daily.      Results for orders  placed or performed during the hospital encounter of 09/04/17 (from the past 48 hour(s))  CBC with Differential/Platelet     Status: Abnormal   Collection Time: 09/04/17  5:10 AM  Result Value Ref Range   WBC 13.0 (H) 4.0 - 10.5 K/uL   RBC 4.07 3.87 - 5.11 MIL/uL   Hemoglobin 11.5 (L) 12.0 - 15.0 g/dL   HCT 33.2 (L) 36.0 - 46.0 %   MCV 81.6 78.0 - 100.0 fL   MCH 28.3 26.0 - 34.0 pg   MCHC 34.6 30.0 - 36.0 g/dL   RDW 19.1 (H) 11.5 - 15.5 %   Platelets 141 (L) 150 - 400 K/uL   Neutrophils Relative % 81 %   Neutro Abs 10.5 (H) 1.7 - 7.7 K/uL   Lymphocytes Relative 14 %   Lymphs Abs 1.8 0.7 - 4.0 K/uL   Monocytes Relative 5 %   Monocytes Absolute 0.7 0.1 - 1.0 K/uL   Eosinophils Relative 0 %   Eosinophils Absolute 0.0 0.0 - 0.7 K/uL   Basophils Relative 0 %   Basophils Absolute 0.0 0.0 - 0.1 K/uL  Basic metabolic panel     Status: Abnormal   Collection Time: 09/04/17  5:10 AM  Result Value Ref Range   Sodium 141 135 - 145 mmol/L   Potassium 3.4 (L) 3.5 - 5.1 mmol/L   Chloride 113 (H) 101 - 111 mmol/L   CO2 17 (L) 22 - 32 mmol/L   Glucose, Bld 134 (H) 65 - 99 mg/dL   BUN 51 (H) 6 - 20 mg/dL   Creatinine, Ser 1.00 0.44 - 1.00 mg/dL   Calcium 7.9 (L) 8.9 - 10.3 mg/dL   GFR calc non Af Amer 59 (L) >60 mL/min   GFR calc Af Amer >60 >60 mL/min    Comment: (NOTE) The eGFR has been calculated using the CKD EPI equation. This calculation has not been validated in all clinical situations. eGFR's persistently <60 mL/min signify possible Chronic Kidney Disease.    Anion gap 11 5 - 15  Hepatic function panel     Status: Abnormal   Collection Time: 09/04/17  5:10 AM  Result Value Ref Range   Total Protein 5.5 (L) 6.5 - 8.1 g/dL   Albumin 3.4 (L) 3.5 - 5.0 g/dL   AST 25 15 - 41 U/L   ALT 23 14 - 54 U/L   Alkaline Phosphatase 42 38 - 126 U/L   Total Bilirubin 0.9 0.3 - 1.2 mg/dL   Bilirubin, Direct 0.2 0.1 - 0.5 mg/dL   Indirect Bilirubin 0.7 0.3 - 0.9 mg/dL  Protime-INR     Status:  None   Collection Time: 09/04/17  5:10 AM  Result Value Ref Range   Prothrombin Time 14.8 11.4 - 15.2 seconds   INR 1.17   POC occult blood, ED     Status: Abnormal   Collection Time: 09/04/17  5:49 AM  Result Value Ref Range   Fecal Occult Bld POSITIVE (A) NEGATIVE   No results found.  ROS  As per history of present illness otherwise all 12 systems were reviewed and there are no pertinent positives and negatives.  Blood pressure 108/65, pulse 95, temperature 98.4 F (36.9 C), temperature source Oral, resp. rate 14, height _0  (1.626 m), weight 62.6 kg (138 lb), SpO2 99 %. Physical Exam   Mild pallor, no obvious icterus Not in acute distress Chest: clear to auscultation bilaterally Heart first and second has an audible regular rate rhythm Neuro: alert awake oriented to time place and person with no focal neurological deficits Extremities:s no pedal edema no deformity Abdomen: soft nondistended nontender with shifting dullness of fluid thrill normoactive bowel sounds Skin: Spider nevi appreciated over right chest over the area of right mastectomy   Assessment/Plan 1. Hematemesis 2.Melena 3. History of esophageal varices and portal hypertension 4. History of gastric varices and BRTO  in 2017. 5. Elevated BUN/creatinine ratio 51/1 6. Normal liver function test 7. Hemoglobin at baseline 11.5, Plt 141, INR 1.17  Diagnostic EGD with possible banding. Patient on IV octreotide. Hemodynamically stable  [name], MD 09/04/2017, 9:08 AM

## 2017-09-04 NOTE — H&P (Signed)
History and Physical    Nicole [name] UUV:[id number] DOB: 08-05-1956 DOA: 09/04/2017  PCP: Aretta Nip, MD Patient coming from: home  Chief Complaint: hematemesis  HPI: Nicole [name] is a 61 y.o. female with medical history significant for stage IV breast cancer with lung metastases status post surgery, chemotherapy, radiation, esophageal varices, inflammatory vascular disease presents to the emergency department chief complaint of vomiting blood. Initial evaluation concerning for GI bleed [facility] asked to admit  Information is obtained from the patient and her husband is at the bedside. He states for the last couple of days she has been feeling generally weak and over this morning she woke up and felt nauseated. She went to the bathroom and husband reports she vomited "dark maroon looking blood". Reports it was a large amount. Yesterday she also had some dark loose stool. She did complain of mild abdominal pain yesterday but this resolved. Patient does have a history of esophageal varices presumably related to breast cancer treatment. April 2017 she underwent EGD has had no bleeding since. Patient denies any headache dizziness syncope or near-syncope. She denies chest pain palpitations shortness of breath. She denies dysuria hematuria frequency or urgency.    ED Course: In the emergency department she's afebrile hemodynamically stable with a blood pressure on the low end of normal mild tachycardia at 106. He is slightly pale. Protonix and Octreitde initiated. She is provided with Zofran and Rocephin as well.  Review of Systems: As per HPI otherwise all other systems reviewed and are negative.   Ambulatory Status: She ambulates independently lives at home with her husband is independent with ADLs  Past Medical History:  Diagnosis Date  . Blood transfusion 2012  . Breast CA ([place])    breast ca dx 06/2010, stage 4, right , Lung Metasis.Surgery, Chemo, Radiation  . Clot    jugular   ?2015  . Depression   . Esophageal varices ([place])   . Headache    Mirgraine- rare since menopause  . Heart murmur    mild, no cardiologist, from birth  . History of kidney stones    no pain- shows in CT- small 1 mm bilaterally- kidney  . IBD (inflammatory bowel disease)    resolved  . Idiopathic cirrhosis ([place])   . Peripheral vascular disease ([place])    Patient does not know anything about this.  . Portal hypertension ([place])   . Radiation 07/21/12-09/07/12   5940 cGy    Past Surgical History:  Procedure Laterality Date  . COLONOSCOPY    . DILATION AND CURETTAGE OF UTERUS  2004?  . ESOPHAGOGASTRODUODENOSCOPY (EGD) WITH PROPOFOL N/A 04/11/2016   Procedure: ESOPHAGOGASTRODUODENOSCOPY (EGD) WITH PROPOFOL;  Surgeon: Clarene Essex, MD;  [facility];  Service: Endoscopy;  Laterality: N/A;  . ESOPHAGOGASTRODUODENOSCOPY (EGD) WITH PROPOFOL N/A 07/08/2016   Procedure: ESOPHAGOGASTRODUODENOSCOPY (EGD) WITH PROPOFOL;  Surgeon: Laurence Spates, MD;  Location: [facility];  Service: Endoscopy;  Laterality: N/A;  . ESOPHAGOGASTRODUODENOSCOPY (EGD) WITH PROPOFOL N/A 03/03/2017   Procedure: ESOPHAGOGASTRODUODENOSCOPY (EGD) WITH PROPOFOL;  Surgeon: Laurence Spates, MD;  Location: [facility];  Service: Endoscopy;  Laterality: N/A;  . IR GENERIC HISTORICAL  05/15/2016   IR RADIOLOGIST EVAL & MGMT 05/15/2016 Corrie Mckusick, DO [facility]  . IR GENERIC HISTORICAL  01/14/2017   IR US GUIDE VASC ACCESS RIGHT 01/14/2017 Greggory Keen, MD [facility]  . IR GENERIC HISTORICAL  01/14/2017   IR VENOGRAM HEPATIC W HEMODYNAMIC EVALUATION 01/14/2017 Greggory Keen, MD [facility]  . MASTECTOMY  06/02/12   right breast  . PORT-A-CATH REMOVAL    . PORTACATH PLACEMENT  2011   left side  . RADIOLOGY WITH ANESTHESIA N/A 04/13/2016   Procedure: RADIOLOGY WITH ANESTHESIA;  Surgeon: Corrie Mckusick, DO;  Location: [facility];  Service: Anesthesiology;  Laterality: N/A;    Social History   Social History  . Marital status:  Married    Spouse name: N/A  . Number of children: N/A  . Years of education: N/A   Occupational History  . Not on file.   Social History Main Topics  . Smoking status: Never Smoker  . Smokeless tobacco: Never Used  . Alcohol use No  . Drug use: No  . Sexual activity: Yes   Other Topics Concern  . Not on file   Social History Narrative  . No narrative on file    No Known Allergies  Family History  Problem Relation Age of Onset  . COPD Mother   . AAA (abdominal aortic aneurysm) Mother   . COPD Father   . Cancer Neg Hx     Prior to Admission medications   Medication Sig Start Date End Date Taking? Authorizing Provider  Calcium Carb-Cholecalciferol (CALCIUM 600 + D PO) Take 1 tablet by mouth daily.   Yes [provider]  Cholecalciferol (VITAMIN D3) 5000 units CAPS Take 5,000 Units by mouth daily.   Yes [provider]  denosumab (PROLIA) 60 MG/ML SOLN injection Inject 60 mg into the skin every 6 (six) months. Administer in upper arm, thigh, or abdomen   Yes [provider]  letrozole (FEMARA) 2.5 MG tablet Take 1 tablet (2.5 mg total) by mouth daily. 12/03/16  Yes Magrinat, Virgie Dad, MD  Magnesium 500 MG TABS Take 500 mg by mouth daily.   Yes [provider]  venlafaxine XR (EFFEXOR-XR) 150 MG 24 hr capsule Take 1 capsule (150 mg total) by mouth daily with breakfast. 12/03/16  Yes Magrinat, Virgie Dad, MD  VITAMIN E PO Take 1 capsule by mouth daily.   Yes [provider]    Physical Exam: Vitals:   09/04/17 0630 09/04/17 0634 09/04/17 0730 09/04/17 0745  BP: 113/73 113/73 111/63 115/79  Pulse: (!) 101 93 95 92  Resp: (!) 21 16 20 12   Temp:      TempSrc:      SpO2: 100% 100% 99% 100%  Weight:      Height:         General:  Appears calm and comfortable, slightly pale Eyes:  PERRL, EOMI, normal lids, iris ENT:  grossly normal hearing, lips & tongue, his membranes of her mouth are slightly pale somewhat dry Neck:  no LAD,  masses or thyromegaly Cardiovascular:  Tachycardic but regular, +murmur. No LE edema.  Respiratory:  CTA bilaterally, no w/r/r. Normal respiratory effort. Abdomen:  soft, ntnd, sluggish BS  Skin:  no rash or induration seen on limited exam Musculoskeletal:  grossly normal tone BUE/BLE, good ROM, no bony abnormality Psychiatric:  grossly normal mood and affect, speech fluent and appropriate, AOx3 Neurologic:  CN 2-12 grossly intact, moves all extremities in coordinated fashion, sensation intact  Labs on Admission: I have personally reviewed following labs and imaging studies  CBC:  Recent Labs Lab 09/04/17 0510  WBC 13.0*  NEUTROABS 10.5*  HGB 11.5*  HCT 33.2*  MCV 81.6  PLT 161*   Basic Metabolic Panel:  Recent Labs Lab 09/04/17 0510  NA 141  K 3.4*  CL 113*  CO2 17*  GLUCOSE 134*  BUN 51*  CREATININE 1.00  CALCIUM 7.9*   GFR: Estimated Creatinine Clearance: 50.4 mL/min (by C-G formula based on SCr of 1 mg/dL). Liver Function Tests:  Recent Labs Lab 09/04/17 0510  AST 25  ALT 23  ALKPHOS 42  BILITOT 0.9  PROT 5.5*  ALBUMIN 3.4*   No results for input(s): LIPASE, AMYLASE in the last 168 hours. No results for input(s): AMMONIA in the last 168 hours. Coagulation Profile:  Recent Labs Lab 09/04/17 0510  INR 1.17   Cardiac Enzymes: No results for input(s): CKTOTAL, CKMB, CKMBINDEX, TROPONINI in the last 168 hours. BNP (last 3 results) No results for input(s): PROBNP in the last 8760 hours. HbA1C: No results for input(s): HGBA1C in the last 72 hours. CBG: No results for input(s): GLUCAP in the last 168 hours. Lipid Profile: No results for input(s): CHOL, HDL, LDLCALC, TRIG, CHOLHDL, LDLDIRECT in the last 72 hours. Thyroid Function Tests: No results for input(s): TSH, T4TOTAL, FREET4, T3FREE, THYROIDAB in the last 72 hours. Anemia Panel: No results for input(s): VITAMINB12, FOLATE, FERRITIN, TIBC, IRON, RETICCTPCT in the last 72 hours. Urine  analysis:    Component Value Date/Time   LABSPEC 1.010 04/29/2016 1118   PHURINE 7.5 04/29/2016 1118   GLUCOSEU Negative 04/29/2016 1118   HGBUR Trace 04/29/2016 1118   BILIRUBINUR Negative 04/29/2016 1118   KETONESUR Negative 04/29/2016 1118   PROTEINUR < 30 04/29/2016 1118   UROBILINOGEN 0.2 04/29/2016 1118   NITRITE Negative 04/29/2016 1118   LEUKOCYTESUR Trace 04/29/2016 1118    Creatinine Clearance: Estimated Creatinine Clearance: 50.4 mL/min (by C-G formula based on SCr of 1 mg/dL).  Sepsis Labs: @LABRCNTIP (procalcitonin:4,lacticidven:4) )No results found for this or any previous visit (from the past 240 hour(s)).   Radiological Exams on Admission: No results found.  EKG: Independently reviewed. Sinus rhythm Probable anteroseptal infarct, old Minimal ST depression, anterolateral leads Prolonged QT interval  Assessment/Plan Principal Problem:   Hematemesis Active Problems:   Breast cancer metastasized to lung [facility])   Esophageal varices (H[facility])   Iron deficiency anemia   Melena   Thrombocytopenia (H[facility])   GI bleed   Idiopathic cirrhosis ([place])   Portal hypertension ([place])   #1.Hematemesis/ melena/gi bleed in patient with hx esophageal varices. Hx of UGI bleed 03/2016 and underwent BRTO by Dr Earleen Newport. No bleeding since then. No episodes since presentation. Hemoglobin 11.4. INR within the limits of normal. protonix and octreotide gtts initiated in the emergency department. Gastroenterology consulted by ED provider. Patient scheduled for endoscopy at time of admission. Of note she has not had bleeding up to this point and has not been started on prophylactic beta blocker therapy -Admit to step down -Nothing by mouth -continue octreotide -Supportive therapy -appreciate GI assistanc  #2.esophageal varices presumably related breast cancer treatment. She had follow-up EGD in March of this year which revealed Non-bleeding grade II esophageal varices. these are somewhat larger  than her prior EGD1 year ago. Normal examined duodenum. No specimens collected. -See #1  #3. Idiopathic cirrhosis/portal hypertension. Review indicates he underwent a liver biopsy in December 2017 which was negative. Being followed [facility] liver clinic at [facility]. Chart review indicates she has documented elevated hepatic wedge pressure gradient. Chart also indicates extensive workup has failed to show any etiology of her liver disease.  #4. Thrombocytopenia. Platelets 141. Chart review indicates history of same. Oncology note indicates related to splenomegaly. INR within the limits of normal  #5. History of breast cancer with metastases to the lung.  Data supposed right mastectomy and axillary lymph node sampling. Recent oncology note indicates 5 years out from surgery no evidence of active disease. Lantus to continue letrozole -Outpatient follow-up  #6. Iron deficiency anemia with history of MCV less than 70. Currently hemoglobin level in 0.5 with MCV 81. She received Feraheme infusion last month. -See #1    DVT prophylaxis: scd  Code Status: full  Family Communication: husband at bedside  Disposition Plan: home  Consults called:  GI Admission status: inpatient    Radene Gunning MD [facility]  If 7PM-7AM, please contact night-coverage [email or web address] Password [email or web address]  09/04/2017, 8:13 AM

## 2017-09-04 NOTE — ED Notes (Signed)
Dr. Marily Memos aware that prontonix is not compatible with octreotide and will be held until octreotide is finished

## 2017-09-04 NOTE — ED Provider Notes (Signed)
[facility] DEPT Provider Note   CSN: [id number] Arrival date & time: 09/04/17  0277     History   Chief Complaint Chief Complaint  Patient presents with  . Emesis    HPI Nicole [name] is a 61 y.o. female.  Patient presents to the ER for evaluation of vomiting blood. Patient brought from home by EMS. She reports that she woke up and felt nauseated, went to the bathroom and vomited. She and her husband report that it was a large volume of blood when she vomited. She has a history of esophageal varices secondary to her breast cancer treatment. She has had previous bleeding, but did have embolization of her varices a year and a half ago. She reports that she had some diarrhea yesterday that was dark in nature, but cannot tell if it was melanotic. She is not expressing any abdominal pain. There is no chest pain or shortness of breath. Patient feels cold and weak.      Past Medical History:  Diagnosis Date  . Blood transfusion 2012  . Breast CA ([place])    breast ca dx 06/2010, stage 4, right , Lung Metasis.Surgery, Chemo, Radiation  . Clot    jugular  ?2015  . Depression   . Esophageal varices ([place])   . Headache    Mirgraine- rare since menopause  . Heart murmur    mild, no cardiologist, from birth  . History of kidney stones    no pain- shows in CT- small 1 mm bilaterally- kidney  . IBD (inflammatory bowel disease)    resolved  . Peripheral vascular disease ([place])    Patient does not know anything about this.  . Radiation 07/21/12-09/07/12   5940 cGy    Patient Active Problem List   Diagnosis Date Noted  . Iron deficiency anemia 05/19/2017  . Osteoporosis 05/09/2016  . Depression 04/30/2016  . UTI (urinary tract infection) 04/30/2016  . Acute upper GI bleed 04/11/2016  . Esophageal varices ([place]) 04/11/2016  . Breast cancer metastasized to lung ([place]) 03/14/2014  . Malignant neoplasm of upper-outer quadrant of right breast in female, estrogen receptor positive ([place])  09/30/2013  . Lymphedema of arm 06/14/2013  . DVT (deep venous thrombosis) ([place]) 12/15/2012    Past Surgical History:  Procedure Laterality Date  . COLONOSCOPY    . DILATION AND CURETTAGE OF UTERUS  2004?  . ESOPHAGOGASTRODUODENOSCOPY (EGD) WITH PROPOFOL N/A 04/11/2016   Procedure: ESOPHAGOGASTRODUODENOSCOPY (EGD) WITH PROPOFOL;  Surgeon: Clarene Essex, MD;  [facility];  Service: Endoscopy;  Laterality: N/A;  . ESOPHAGOGASTRODUODENOSCOPY (EGD) WITH PROPOFOL N/A 07/08/2016   Procedure: ESOPHAGOGASTRODUODENOSCOPY (EGD) WITH PROPOFOL;  Surgeon: Laurence Spates, MD;  Location: [place];  Service: Endoscopy;  Laterality: N/A;  . ESOPHAGOGASTRODUODENOSCOPY (EGD) WITH PROPOFOL N/A 03/03/2017   Procedure: ESOPHAGOGASTRODUODENOSCOPY (EGD) WITH PROPOFOL;  Surgeon: Laurence Spates, MD;  Location: [place];  Service: Endoscopy;  Laterality: N/A;  . IR GENERIC HISTORICAL  05/15/2016   IR RADIOLOGIST EVAL & MGMT 05/15/2016 Corrie Mckusick, DO [facility]  . IR GENERIC HISTORICAL  01/14/2017   IR US GUIDE VASC ACCESS RIGHT 01/14/2017 Greggory Keen, MD [facility]  . IR GENERIC HISTORICAL  01/14/2017   IR VENOGRAM HEPATIC W HEMODYNAMIC EVALUATION 01/14/2017 Greggory Keen, MD [facility]  . MASTECTOMY  06/02/12   right breast  . PORT-A-CATH REMOVAL    . PORTACATH PLACEMENT  2011   left side  . RADIOLOGY WITH ANESTHESIA N/A 04/13/2016   Procedure: RADIOLOGY WITH ANESTHESIA;  Surgeon: York Cerise  Earleen Newport, DO;  Location: [facility];  Service: Anesthesiology;  Laterality: N/A;    OB History    No data available       Home Medications    Prior to Admission medications   Medication Sig Start Date End Date Taking? Authorizing Provider  Calcium Carb-Cholecalciferol (CALCIUM 600 + D PO) Take 1 tablet by mouth daily.   Yes [provider]  Cholecalciferol (VITAMIN D3) 5000 units CAPS Take 5,000 Units by mouth daily.   Yes [provider]  denosumab (PROLIA) 60 MG/ML SOLN injection Inject  60 mg into the skin every 6 (six) months. Administer in upper arm, thigh, or abdomen   Yes [provider]  letrozole (FEMARA) 2.5 MG tablet Take 1 tablet (2.5 mg total) by mouth daily. 12/03/16  Yes Magrinat, Virgie Dad, MD  Magnesium 500 MG TABS Take 500 mg by mouth daily.   Yes [provider]  venlafaxine XR (EFFEXOR-XR) 150 MG 24 hr capsule Take 1 capsule (150 mg total) by mouth daily with breakfast. 12/03/16  Yes Magrinat, Virgie Dad, MD  VITAMIN E PO Take 1 capsule by mouth daily.   Yes [provider]    Family History Family History  Problem Relation Age of Onset  . COPD Mother   . AAA (abdominal aortic aneurysm) Mother   . COPD Father   . Cancer Neg Hx     Social History Social History  Substance Use Topics  . Smoking status: Never Smoker  . Smokeless tobacco: Never Used  . Alcohol use No     Allergies   Patient has no known allergies.   Review of Systems Review of Systems  Constitutional: Positive for fatigue.  Gastrointestinal: Positive for diarrhea and vomiting. Negative for abdominal pain.  All other systems reviewed and are negative.    Physical Exam Updated Vital Signs BP 109/74 (BP Location: Left Arm)   Pulse 99   Temp (!) 97.3 F (36.3 C) (Oral)   Resp 16   Ht 5\' 4"  (1.626 m)   Wt 62.6 kg (138 lb)   SpO2 98%   BMI 23.69 kg/m   Physical Exam  Constitutional: She is oriented to person, place, and time. She appears well-developed and well-nourished. No distress.  HENT:  Head: Normocephalic and atraumatic.  Right Ear: Hearing normal.  Left Ear: Hearing normal.  Nose: Nose normal.  Mouth/Throat: Oropharynx is clear and moist and mucous membranes are normal.  Eyes: Pupils are equal, round, and reactive to light. Conjunctivae and EOM are normal.  Neck: Normal range of motion. Neck supple.  Cardiovascular: Regular rhythm, S1 normal and S2 normal.  Tachycardia present.  Exam reveals no gallop and no friction rub.   No murmur  heard. Pulmonary/Chest: Effort normal and breath sounds normal. No respiratory distress. She exhibits no tenderness.  Abdominal: Soft. Normal appearance and bowel sounds are normal. There is no hepatosplenomegaly. There is no tenderness. There is no rebound, no guarding, no tenderness at McBurney's point and negative Murphy's sign. No hernia.  Genitourinary: Rectal exam shows guaiac positive stool (melena).  Musculoskeletal: Normal range of motion.  Neurological: She is alert and oriented to person, place, and time. She has normal strength. No cranial nerve deficit or sensory deficit. Coordination normal. GCS eye subscore is 4. GCS verbal subscore is 5. GCS motor subscore is 6.  Skin: Skin is dry and intact. No rash noted. No cyanosis. There is pallor.  Psychiatric: She has a normal mood and affect. Her speech is normal  and behavior is normal. Thought content normal.  Nursing note and vitals reviewed.    ED Treatments / Results  Labs (all labs ordered are listed, but only abnormal results are displayed) Labs Reviewed  CBC WITH DIFFERENTIAL/PLATELET  BASIC METABOLIC PANEL  HEPATIC FUNCTION PANEL  PROTIME-INR  POC OCCULT BLOOD, ED    EKG  EKG Interpretation  Date/Time:  Thursday September 04 2017 05:02:12 EDT Ventricular Rate:  99 PR Interval:    QRS Duration: 93 QT Interval:  411 QTC Calculation: 528 R Axis:   48 Text Interpretation:  Sinus rhythm Probable anteroseptal infarct, old Minimal ST depression, anterolateral leads Prolonged QT interval Confirmed by Orpah Greek [phone]) on 09/04/2017 5:14:46 AM       Radiology No results found.  Procedures Procedures (including critical care time)  Medications Ordered in ED Medications  octreotide (SANDOSTATIN) 2 mcg/mL load via infusion 50 mcg (not administered)    And  octreotide (SANDOSTATIN) 500 mcg in sodium chloride 0.9 % 250 mL (2 mcg/mL) infusion (not administered)  pantoprazole (PROTONIX) 80 mg in sodium  chloride 0.9 % 100 mL IVPB (not administered)  pantoprazole (PROTONIX) 80 mg in sodium chloride 0.9 % 250 mL (0.32 mg/mL) infusion (not administered)  pantoprazole (PROTONIX) injection 40 mg (not administered)  sodium chloride 0.9 % bolus 500 mL (500 mLs Intravenous New Bag/Given 09/04/17 0512)  cefTRIAXone (ROCEPHIN) 1 g in dextrose 5 % 50 mL IVPB (1 g Intravenous New Bag/Given 09/04/17 0513)     Initial Impression / Assessment and Plan / ED Course  I have reviewed the triage vital signs and the nursing notes.  Pertinent labs & imaging results that were available during my care of the patient were reviewed by me and considered in my medical decision making (see chart for details).     Patient presents to the ER for hematemesis. She has a history of esophageal varices with bleeding in the past.Patient reports a large volume of hematemesis at home. Patient started on octreotide and Protonix at arrival. She was empirically administered Rocephin. She will require hospitalization for further management of presumed esophageal bleed.  CRITICAL CARE Performed by: Orpah Greek   Total critical care time: 30 minutes  Critical care time was exclusive of separately billable procedures and treating other patients.  Critical care was necessary to treat or prevent imminent or life-threatening deterioration.  Critical care was time spent personally by me on the following activities: development of treatment plan with patient and/or surrogate as well as nursing, discussions with consultants, evaluation of patient's response to treatment, examination of patient, obtaining history from patient or surrogate, ordering and performing treatments and interventions, ordering and review of laboratory studies, ordering and review of radiographic studies, pulse oximetry and re-evaluation of patient's condition.   Final Clinical Impressions(s) / ED Diagnoses   Final diagnoses:  Upper GI bleed    New  Prescriptions New Prescriptions   No medications on file     Orpah Greek, MD 09/07/17 0205

## 2017-09-04 NOTE — Op Note (Signed)
[facility] Patient Name: Nicole [name] Procedure Date : 09/04/2017 MRN: [id number] Attending MD: [name] , MD Date of Birth: July 18, 1956 CSN: [id number] Age: 61 Admit Type: Inpatient Procedure:                Upper GI endoscopy Indications:              Hematemesis, Melena, Portal hypertension with                            suspected esophageal varices Providers:                [name], MD, Burtis Junes, RN Referring MD:              Medicines:                Monitored Anesthesia Care Complications:            No immediate complications. Estimated Blood Loss:     Estimated blood loss: none. Procedure:                Pre-Anesthesia Assessment:                           - Prior to the procedure, a History and Physical                            was performed, and patient medications and                            allergies were reviewed. The patient's tolerance of                            previous anesthesia was also reviewed. The risks                            and benefits of the procedure and the sedation                            options and risks were discussed with the patient.                            All questions were answered, and informed consent                            was obtained. Prior Anticoagulants: The patient has                            taken no previous anticoagulant or antiplatelet                            agents. ASA Grade Assessment: III - A patient with                            severe systemic disease. After reviewing the risks  and benefits, the patient was deemed in                            satisfactory condition to undergo the procedure.                           After obtaining informed consent, the endoscope was                            passed under direct vision. Throughout the                            procedure, the patient's blood pressure, pulse, and                            oxygen  saturations were monitored continuously. The                            [id number] ([id number]) scope was introduced through the                            mouth, and advanced to the second part of duodenum.                            The upper GI endoscopy was accomplished without                            difficulty. The patient tolerated the procedure                            well. Scope In: Scope Out: Findings:      Grade III varices were found in the middle third of the esophagus and in       the lower third of the esophagus. They were medium in size. Seven bands       were successfully placed with complete eradication, resulting in       deflation of varices. There was no bleeding during, and at the end, of       the procedure.      Type 1 gastroesophageal varices (GOV1, esophageal varices which extend       along the lesser curvature) with no bleeding were found in the cardia.       There were no stigmata of recent bleeding. They were small in largest       diameter.      Mild portal hypertensive gastropathy was found in the gastric body.      The examined duodenum was normal. Impression:               - Grade III esophageal varices. Completely                            eradicated. Banded.                           - Possible Type 1 gastroesophageal varices (GOV1,  esophageal varices which extend along the lesser                            curvature), without bleeding.Patient had BRTO                            previously.                           - Portal hypertensive gastropathy.                           - Normal examined duodenum.                           - No specimens collected. Moderate Sedation:      Patient did not receive moderate sedation for this procedure, but       instead received monitored anesthesia care. Recommendation:           - Clear liquid diet today.                           - Resume regular diet in am.                            - Administer an IV bolus of 50 micrograms of                            octreotide followed by an infusion of 50 micrograms                            per hour today.                           - Give a beta blocker with dosage titrated by the                            heart rate.                           - Continue present medications.                           - Return patient to hospital ward for ongoing care. Procedure Code(s):        --- Professional ---                           [id number], Esophagogastroduodenoscopy, flexible,                            transoral; with band ligation of esophageal/gastric                            varices Diagnosis Code(s):        --- Professional ---  K76.6, Portal hypertension                           I85.10, Secondary esophageal varices without                            bleeding                           I86.4, Gastric varices                           K31.89, Other diseases of stomach and duodenum                           K92.0, Hematemesis                           K92.1, Melena (includes Hematochezia) CPT copyright 2016 [employer]. All rights reserved. The codes documented in this report are preliminary and upon coder review may  be revised to meet current compliance requirements. [name], MD 09/04/2017 10:52:43 AM This report has been signed electronically. Number of Addenda: 0

## 2017-09-04 NOTE — Op Note (Signed)
EGD showed grade 3 varices from 25-35 cm from insertion in mid and distal esophagus with red wale sign. 7 bands were subsequently deployed resulting in eradication of the varices. GE junction was located at 35 cm. The gastric cavity had evidence of mild portal hypertensive gastropathy. Retroflexion revealed 2 prominent bulges in the cardia at the GE junction suspicious for reminant gastric varices (previous BRTO). The overlying mucosa appeared normal with no evidence of recent or active bleeding from this site. The duodenal bulb and rest of the duodenum appeared unremarkable.  Patient may take clear liquid diet today. Regular diet in a.m. and discharge to home in a.m. if hemoglobin remains stable. Need a repeat EGD in 6-8 weeks for repeat banding order to completely eradicate the varices. This can be arranged as an outpatient. Will need to be on a nonselective beta blocker to keep the heart rate between 55-60/m his long as systolic blood pressure is above 90 mmHg.   [name] ,M.D. [phone]

## 2017-09-04 NOTE — Anesthesia Postprocedure Evaluation (Signed)
Anesthesia Post Note  Patient: Nicole [name]  Procedure(s) Performed: Procedure(s) (LRB): ESOPHAGOGASTRODUODENOSCOPY (EGD) WITH PROPOFOL (N/A) ESOPHAGEAL BANDING     Patient location during evaluation: PACU Anesthesia Type: MAC Level of consciousness: awake and alert Pain management: pain level controlled Vital Signs Assessment: post-procedure vital signs reviewed and stable Respiratory status: spontaneous breathing, nonlabored ventilation, respiratory function stable and patient connected to nasal cannula oxygen Cardiovascular status: stable and blood pressure returned to baseline Anesthetic complications: no    Last Vitals:  Vitals:   09/04/17 1330 09/04/17 1430  BP:  122/75  Pulse: 97 (!) 102  Resp: 16   Temp:    SpO2: 99% 99%    Last Pain:  Vitals:   09/04/17 1110  TempSrc:   PainSc: 2                  Ahmed Inniss P Youcef Klas

## 2017-09-05 DIAGNOSIS — D508 Other iron deficiency anemias: Secondary | ICD-10-CM

## 2017-09-05 DIAGNOSIS — D696 Thrombocytopenia, unspecified: Secondary | ICD-10-CM

## 2017-09-05 DIAGNOSIS — I8501 Esophageal varices with bleeding: Secondary | ICD-10-CM

## 2017-09-05 DIAGNOSIS — D62 Acute posthemorrhagic anemia: Secondary | ICD-10-CM

## 2017-09-05 LAB — CBC
HCT: 21.3 % — ABNORMAL LOW (ref 36.0–46.0)
HCT: 23.7 % — ABNORMAL LOW (ref 36.0–46.0)
HEMOGLOBIN: 7.5 g/dL — AB (ref 12.0–15.0)
HEMOGLOBIN: 8.2 g/dL — AB (ref 12.0–15.0)
MCH: 28.7 pg (ref 26.0–34.0)
MCH: 28.7 pg (ref 26.0–34.0)
MCHC: 35.2 g/dL (ref 30.0–36.0)
MCHC: 34.6 g/dL (ref 30.0–36.0)
MCV: 81.6 fL (ref 78.0–100.0)
MCV: 82.9 fL (ref 78.0–100.0)
Platelets: 73 10*3/uL — ABNORMAL LOW (ref 150–400)
Platelets: 73 10*3/uL — ABNORMAL LOW (ref 150–400)
RBC: 2.61 MIL/uL — ABNORMAL LOW (ref 3.87–5.11)
RBC: 2.86 MIL/uL — AB (ref 3.87–5.11)
RDW: 19.1 % — ABNORMAL HIGH (ref 11.5–15.5)
RDW: 18.9 % — ABNORMAL HIGH (ref 11.5–15.5)
WBC: 7 10*3/uL (ref 4.0–10.5)
WBC: 5.7 10*3/uL (ref 4.0–10.5)

## 2017-09-05 LAB — COMPREHENSIVE METABOLIC PANEL
ALT: 21 U/L (ref 14–54)
ANION GAP: 8 (ref 5–15)
AST: 25 U/L (ref 15–41)
Albumin: 3.3 g/dL — ABNORMAL LOW (ref 3.5–5.0)
Alkaline Phosphatase: 35 U/L — ABNORMAL LOW (ref 38–126)
BUN: 16 mg/dL (ref 6–20)
CHLORIDE: 118 mmol/L — AB (ref 101–111)
CO2: 17 mmol/L — AB (ref 22–32)
Calcium: 7.1 mg/dL — ABNORMAL LOW (ref 8.9–10.3)
Creatinine, Ser: 0.88 mg/dL (ref 0.44–1.00)
GFR calc non Af Amer: >60 mL/min (ref >60)
Glucose, Bld: 109 mg/dL — ABNORMAL HIGH (ref 65–99)
Potassium: 3.3 mmol/L — ABNORMAL LOW (ref 3.5–5.1)
SODIUM: 143 mmol/L (ref 135–145)
Total Bilirubin: 0.5 mg/dL (ref 0.3–1.2)
Total Protein: 5.2 g/dL — ABNORMAL LOW (ref 6.5–8.1)

## 2017-09-05 LAB — HIV ANTIBODY (ROUTINE TESTING W REFLEX): HIV SCREEN 4TH GENERATION: NONREACTIVE

## 2017-09-05 MED ORDER — PROPRANOLOL HCL 20 MG PO TABS
20.0000 mg | ORAL_TABLET | Freq: Two times a day (BID) | ORAL | Status: DC
  Administered 2017-09-05 – 2017-09-07 (×5): 20 mg via ORAL
  Filled 2017-09-05 (×5): qty 1

## 2017-09-05 MED ORDER — PANTOPRAZOLE SODIUM 40 MG PO TBEC
40.0000 mg | DELAYED_RELEASE_TABLET | Freq: Every day | ORAL | Status: DC
  Administered 2017-09-05 – 2017-09-07 (×3): 40 mg via ORAL
  Filled 2017-09-05 (×3): qty 1

## 2017-09-05 MED ORDER — VENLAFAXINE HCL ER 75 MG PO CP24
150.0000 mg | ORAL_CAPSULE | Freq: Every day | ORAL | Status: DC
  Administered 2017-09-05 – 2017-09-07 (×3): 150 mg via ORAL
  Filled 2017-09-05 (×3): qty 2

## 2017-09-05 MED ORDER — VENLAFAXINE HCL ER 75 MG PO CP24: 150.0000 mg | ORAL_CAPSULE | Freq: Every day | ORAL | Status: DC

## 2017-09-05 MED ORDER — POTASSIUM CHLORIDE CRYS ER 20 MEQ PO TBCR
40.0000 meq | EXTENDED_RELEASE_TABLET | Freq: Once | ORAL | Status: AC
  Administered 2017-09-05: 40 meq via ORAL
  Filled 2017-09-05: qty 2

## 2017-09-05 MED ORDER — MAGNESIUM OXIDE 400 (241.3 MG) MG PO TABS
400.0000 mg | ORAL_TABLET | Freq: Every day | ORAL | Status: DC
  Administered 2017-09-05 – 2017-09-07 (×3): 400 mg via ORAL
  Filled 2017-09-05 (×3): qty 1

## 2017-09-05 NOTE — Progress Notes (Signed)
PROGRESS NOTE   Nicole [name]  IEP:[id number]    DOB: 01-16-1956    DOA: 09/04/2017  PCP: Aretta Nip, MD   I have briefly reviewed patients previous medical records in [facility].  Brief Narrative:  61 year old female with history of metastatic breast cancer, status post right mastectomy, as per oncology follow-up 04/2017 no evidence of active disease, left jugular vein DVT for which she completed Coumadin in 2015, osteoporosis, portal hypertension of unclear etiology and no definitive diagnosis of cirrhosis, gastric varices, iron deficiency anemia presented to the ED on 09/04/17 with an episode of large coffee-ground/maroon looking emesis and melena. GI consulted, status post EGD and multiple variceal banding 9/6. Currently on octreotide drip. Possible discharge home 9/8.   Assessment & Plan:   Principal Problem:   Hematemesis Active Problems:   Breast cancer metastasized to lung [facility])   Esophageal varices (HCC)   Iron deficiency anemia   Melena   Thrombocytopenia (HCC)   GI bleed   Idiopathic cirrhosis (HCC)   Portal hypertension ([place])   1. Acute upper GI/variceal bleed: [facility] was consulted. Patient underwent EGD and 7 bands placed 9/6. As per GI, although patient has esophageal and gastric paresis, thrombocytopenia, recent CT scan June 2018 showed no apparent evidence of cirrhosis, liver biopsy December 2017 showed minimal portal inflammation and no evidence of cirrhosis. Continue Protonix 40 mg daily, additional day of IV Protonix infusion, low-dose propranolol added and will need repeat EGD in 4-6 weeks for further band ligation. Bleeding seems to have clinically stopped. 2. Portal hypertension: As stated above, unclear etiology. Complicated by esophageal and gastric paresis. Propranolol started during this admission. Surveillance EGD as outpatient. 3. Acute blood loss anemia: Complicating iron deficiency anemia. Hemoglobin dropped from 11.5-7.5 but has improved to  8.2. Monitor closely and transfuse if hemoglobin <7 g per DL.  4. Thrombocytopenia: Acute on chronic related to acute bleeding. Stable in the 70s. Follow CBC in a.m. 5. Hypokalemia: Replace and follow. 6. History of metastatic breast cancer: In remission. Continue letrozole. 7. Depression: Continue Effexor. No suicidal or homicidal ideations.   DVT prophylaxis: SCDs Code Status: Full Family Communication: None at bedside Disposition: DC home possibly 9/8   Consultants:  [facility]   Procedures:  EGD and variceal banding 7 on 9/6  Antimicrobials:  None    Subjective: Seen this morning. No further emesis since hospital admission. Had one or 2 black stools yesterday but none today. Feels tired but denies weakness, dizziness or lightheadedness.   ROS: No chest pain, dyspnea or palpitations reported.  Objective:  Vitals:   09/04/17 1616 09/04/17 1652 09/04/17 2039 09/05/17 0454  BP: (!) 113/56 (!) 141/71 129/67 109/63  Pulse:  82 86 90  Resp: 19 18 20 20   Temp:  98.4 F (36.9 C) 98.6 F (37 C) 98.4 F (36.9 C)  TempSrc:  Oral Oral Oral  SpO2: 99% 100% 100% 100%  Weight:  64.4 kg (141 lb 14.4 oz)    Height:  5\' 4"  (1.626 m)      Examination:  General exam: Pleasant middle-aged female lying comfortably supine in bed. Respiratory system: Clear to auscultation. Respiratory effort normal. Cardiovascular system: S1 & S2 heard, RRR. No JVD, murmurs, rubs, gallops or clicks. No pedal edema. Telemetry: Sinus rhythm. Gastrointestinal system: Abdomen is nondistended, soft and nontender. No organomegaly or masses felt. Normal bowel sounds heard. Central nervous system: Alert and oriented. No focal neurological deficits. Extremities: Symmetric 5 x 5 power. Skin: No rashes,  lesions or ulcers Psychiatry: Judgement and insight appear normal. Mood & affect appropriate.     Data Reviewed: I have personally reviewed following labs and imaging studies  CBC:  Recent Labs Lab  09/04/17 0510 09/04/17 1942 09/05/17 0255 09/05/17 0602  WBC 13.0* 8.9 7.0 5.7  NEUTROABS 10.5*  --   --   --   HGB 11.5* 9.9* 7.5* 8.2*  HCT 33.2* 30.0* 21.3* 23.7*  MCV 81.6 84.5 81.6 82.9  PLT 141* 106* 73* 73*   Basic Metabolic Panel:  Recent Labs Lab 09/04/17 0510 09/05/17 0602  NA 141 143  K 3.4* 3.3*  CL 113* 118*  CO2 17* 17*  GLUCOSE 134* 109*  BUN 51* 16  CREATININE 1.00 0.88  CALCIUM 7.9* 7.1*   Liver Function Tests:  Recent Labs Lab 09/04/17 0510 09/05/17 0602  AST 25 25  ALT 23 21  ALKPHOS 42 35*  BILITOT 0.9 0.5  PROT 5.5* 5.2*  ALBUMIN 3.4* 3.3*   Coagulation Profile:  Recent Labs Lab 09/04/17 0510  INR 1.17   Cardiac Enzymes: No results for input(s): CKTOTAL, CKMB, CKMBINDEX, TROPONINI in the last 168 hours. HbA1C: No results for input(s): HGBA1C in the last 72 hours. CBG: No results for input(s): GLUCAP in the last 168 hours.  Recent Results (from the past 240 hour(s))  MRSA PCR Screening     Status: None   Collection Time: 09/04/17  5:49 PM  Result Value Ref Range Status   MRSA by PCR NEGATIVE NEGATIVE Final    Comment:        The GeneXpert MRSA Assay (FDA approved for NASAL specimens only), is one component of a comprehensive MRSA colonization surveillance program. It is not intended to diagnose MRSA infection nor to guide or monitor treatment for MRSA infections.          Radiology Studies: No results found.      Scheduled Meds: . letrozole  2.5 mg Oral Daily  . pantoprazole  40 mg Oral Daily  . propranolol  20 mg Oral BID  . venlafaxine XR  150 mg Oral Q breakfast   Continuous Infusions: . octreotide  (SANDOSTATIN)    IV infusion 50 mcg/hr (09/05/17 0715)     LOS: 1 day     Aquarius Latouche, MD, FACP, FHM. [facility] Pager [phone]  If 7PM-7AM, please contact night-coverage [email or web address] Password [facility] 09/05/2017, 2:47 PM

## 2017-09-05 NOTE — Progress Notes (Addendum)
[facility] Progress Note  Nicole [name] 61 y.o. 1956-06-06  CC:  Hematemesis and melena   Subjective: Patient is status post EGD with 7 bands placement yesterday. Had 1 black colored bowel movement yesterday afternoon. No bowel movements today. Complaining of mild substernal discomfort. Denied any vomiting. Denied any bright red blood per rectum.  ROS : Negative for chest pain and shortness of breath   Objective: Vital signs in last 24 hours: Vitals:   09/04/17 2039 09/05/17 0454  BP: 129/67 109/63  Pulse: 86 90  Resp: 20 20  Temp: 98.6 F (37 C) 98.4 F (36.9 C)  SpO2: 100% 100%    Physical Exam:  General:  Alert, cooperative, no distress, appears stated age  Head:  Normocephalic, without obvious abnormality, atraumatic  Eyes:  , EOM's intact,   Lungs:   Clear to auscultation bilaterally, respirations unlabored  Heart:  Regular rate and rhythm, S1, S2 normal  Abdomen:   Soft, non-tender, bowel sounds active all four quadrants,  no masses,   Extremities: Extremities normal, atraumatic, no  edema       Lab Results:  Recent Labs  09/04/17 0510 09/05/17 0602  NA 141 143  K 3.4* 3.3*  CL 113* 118*  CO2 17* 17*  GLUCOSE 134* 109*  BUN 51* 16  CREATININE 1.00 0.88  CALCIUM 7.9* 7.1*    Recent Labs  09/04/17 0510 09/05/17 0602  AST 25 25  ALT 23 21  ALKPHOS 42 35*  BILITOT 0.9 0.5  PROT 5.5* 5.2*  ALBUMIN 3.4* 3.3*    Recent Labs  09/04/17 0510  09/05/17 0255 09/05/17 0602  WBC 13.0*  < > 7.0 5.7  NEUTROABS 10.5*  --   --   --   HGB 11.5*  < > 7.5* 8.2*  HCT 33.2*  < > 21.3* 23.7*  MCV 81.6  < > 81.6 82.9  PLT 141*  < > 73* 73*  < > = values in this interval not displayed.  Recent Labs  09/04/17 0510  LABPROT 14.8  INR 1.17      Assessment/Plan: - Hematemesis from esophageal varices. Status post EGD with 7 bands placement yesterday.  - History of portal hypertension from unknown etiology complicated by esophageal and  gastric varices.  - S/P BRTO for gastric varices in 2017  Recommendations ------------------------- -  Although patient has esophageal and gastric varices and  Thrombocytopenia,  recent CT scan in June 2018 showed no apparent evidence of cirrhosis, also liver biopsy in December 2017 showed minimal portal inflammation and no evidence of cirrhosis. - Recommend continuation of octreotide infusion for 1 more day. - Start Protonix once a day for substernal burning. - She will need repeat EGD in 4-6 week for further band ligation. - Continue low-dose propranolol, hold for  heart rate less than 55 and blood pressure less than 90/50 - GI will follow.   Otis Brace MD, [place] 09/05/2017, 11:33 AM  Pager [phone]  If no answer or after 5 PM call [phone]

## 2017-09-05 NOTE — Plan of Care (Signed)
Problem: Safety: Goal: Ability to remain free from injury will improve Outcome: Progressing Pt will remain free from falls and injuries during this hospitalization.  Problem: Pain Managment: Goal: General experience of comfort will improve Outcome: Progressing Pt will be free from pain during this hospitalization.  Problem: Bowel/Gastric: Goal: Will not experience complications related to bowel motility Outcome: Progressing Pt will be free from bowel complications prior to discharge.

## 2017-09-05 NOTE — Progress Notes (Signed)
PCR completed on 9/6 and was negative.

## 2017-09-06 DIAGNOSIS — K920 Hematemesis: Secondary | ICD-10-CM

## 2017-09-06 LAB — BASIC METABOLIC PANEL
Anion gap: 7 (ref 5–15)
BUN: 9 mg/dL (ref 6–20)
CALCIUM: 7.3 mg/dL — AB (ref 8.9–10.3)
CO2: 20 mmol/L — AB (ref 22–32)
CREATININE: 0.82 mg/dL (ref 0.44–1.00)
Chloride: 114 mmol/L — ABNORMAL HIGH (ref 101–111)
GFR calc non Af Amer: >60 mL/min (ref >60)
Glucose, Bld: 102 mg/dL — ABNORMAL HIGH (ref 65–99)
Potassium: 3.4 mmol/L — ABNORMAL LOW (ref 3.5–5.1)
Sodium: 141 mmol/L (ref 135–145)

## 2017-09-06 LAB — CBC
HEMATOCRIT: 23.6 % — AB (ref 36.0–46.0)
Hemoglobin: 7.8 g/dL — ABNORMAL LOW (ref 12.0–15.0)
MCH: 27.8 pg (ref 26.0–34.0)
MCHC: 33.1 g/dL (ref 30.0–36.0)
MCV: 84 fL (ref 78.0–100.0)
Platelets: 71 10*3/uL — ABNORMAL LOW (ref 150–400)
RBC: 2.81 MIL/uL — ABNORMAL LOW (ref 3.87–5.11)
RDW: 18.3 % — AB (ref 11.5–15.5)
WBC: 4.7 10*3/uL (ref 4.0–10.5)

## 2017-09-06 LAB — MAGNESIUM: Magnesium: 2 mg/dL (ref 1.7–2.4)

## 2017-09-06 MED ORDER — POTASSIUM CHLORIDE CRYS ER 20 MEQ PO TBCR
30.0000 meq | EXTENDED_RELEASE_TABLET | Freq: Once | ORAL | Status: AC
  Administered 2017-09-06: 30 meq via ORAL
  Filled 2017-09-06: qty 1

## 2017-09-06 MED ORDER — POTASSIUM CHLORIDE CRYS ER 20 MEQ PO TBCR
30.0000 meq | EXTENDED_RELEASE_TABLET | ORAL | Status: AC
  Administered 2017-09-06: 30 meq via ORAL
  Filled 2017-09-06: qty 1

## 2017-09-06 NOTE — Progress Notes (Signed)
[facility] Progress Note  Nicole [name] 61 y.o. Nov 22, 1956  CC:  Hematemesis and melena   Subjective:  Patient monitoring episode of black color stool. Denied abdominal pain. Substernal burning is improving. Denied nausea and vomiting.  ROS : Negative for chest pain and shortness of breath   Objective: Vital signs in last 24 hours: Vitals:   09/06/17 0505 09/06/17 0510  BP: (!) 80/45 (!) 108/48  Pulse:    Resp:    Temp:    SpO2:      Physical Exam:  General:  Alert, cooperative, no distress, appears stated age  Head:  Normocephalic, without obvious abnormality, atraumatic  Eyes:  , EOM's intact,   Lungs:   Clear to auscultation bilaterally, respirations unlabored  Heart:  Regular rate and rhythm, S1, S2 normal  Abdomen:   Soft, non-tender, bowel sounds active all four quadrants,  no masses,   Extremities: Extremities normal, atraumatic, no  edema       Lab Results:  Recent Labs  09/05/17 0602 09/06/17 0529  NA 143 141  K 3.3* 3.4*  CL 118* 114*  CO2 17* 20*  GLUCOSE 109* 102*  BUN 16 9  CREATININE 0.88 0.82  CALCIUM 7.1* 7.3*    Recent Labs  09/04/17 0510 09/05/17 0602  AST 25 25  ALT 23 21  ALKPHOS 42 35*  BILITOT 0.9 0.5  PROT 5.5* 5.2*  ALBUMIN 3.4* 3.3*    Recent Labs  09/04/17 0510  09/05/17 0602 09/06/17 0529  WBC 13.0*  < > 5.7 4.7  NEUTROABS 10.5*  --   --   --   HGB 11.5*  < > 8.2* 7.8*  HCT 33.2*  < > 23.7* 23.6*  MCV 81.6  < > 82.9 84.0  PLT 141*  < > 73* 71*  < > = values in this interval not displayed.  Recent Labs  09/04/17 0510  LABPROT 14.8  INR 1.17      Assessment/Plan: - Hematemesis from esophageal varices. Status post EGD with 7 bands placement yesterday.  - History of portal hypertension from unknown etiology complicated by esophageal and gastric varices.  - S/P BRTO for gastric varices in 2017  Recommendations ------------------------- - Recommend continuation of octreotide infusion for 1  more day to complete 72 hours protocol. - repeat hemoglobin in the morning. - Continue once a day PPI. - Continue low-dose propranolol, hold for  heart rate less than 55 and blood pressure less than 90/50 - hopefully discharge tomorrow if hemoglobin remains stable.  -  Although patient has esophageal and gastric varices and  Thrombocytopenia,  recent CT scan in June 2018 showed no apparent evidence of cirrhosis, also liver biopsy in December 2017 showed minimal portal inflammation and no evidence of cirrhosis. - She will need repeat EGD in 4-6 week for further band ligation.  - GI will follow.    Otis Brace MD, [place] 09/06/2017, 8:46 AM  Pager [phone]  If no answer or after 5 PM call [phone]

## 2017-09-06 NOTE — Progress Notes (Signed)
PROGRESS NOTE   Nicole [name]  VEH:[id number]    DOB: 05/12/1956    DOA: 09/04/2017  PCP: Aretta Nip, MD   I have briefly reviewed patients previous medical records in [facility].  Brief Narrative:  61 year old female with history of metastatic breast cancer, status post right mastectomy, as per oncology follow-up 04/2017 no evidence of active disease, left jugular vein DVT for which she completed Coumadin in 2015, osteoporosis, portal hypertension of unclear etiology and no definitive diagnosis of cirrhosis, gastric varices, iron deficiency anemia presented to the ED on 09/04/17 with an episode of large coffee-ground/maroon looking emesis and melena. GI consulted, status post EGD and multiple variceal banding 9/6. Currently on octreotide drip. Possible discharge home 9/9.   Assessment & Plan:   Principal Problem:   Hematemesis Active Problems:   Breast cancer metastasized to lung [facility])   Esophageal varices (HCC)   Iron deficiency anemia   Melena   Thrombocytopenia (HCC)   GI bleed   Idiopathic cirrhosis (HCC)   Portal hypertension ([place])   1. Acute upper GI/variceal bleed: [facility] was consulted. Patient underwent EGD and 7 bands placed 9/6. As per GI, although patient has esophageal and gastric paresis, thrombocytopenia, recent CT scan June 2018 showed no apparent evidence of cirrhosis, liver biopsy December 2017 showed minimal portal inflammation and no evidence of cirrhosis. Continue Protonix 40 mg daily, additional day of IV Octreotide infusion (as per today's GI follow-up, complete 72 hours of octreotide infusion), low-dose propranolol added and will need repeat EGD in 4-6 weeks for further band ligation. Bleeding seems to have clinically stopped. Likely discharge home 9/9. 2. Portal hypertension: As stated above, unclear etiology. Complicated by esophageal and gastric paresis. Propranolol started during this admission. Surveillance EGD as outpatient. 3. Acute blood loss  anemia: Complicating iron deficiency anemia. Hemoglobin dropped from 11.5-7.5 but has improved to 8.2. Monitor closely and transfuse if hemoglobin <7 g per DL. Hemoglobin stable over the last 24 hours 4. Thrombocytopenia: Acute on chronic related to acute bleeding. Stable in the 70s. Follow CBC in a.m. 5. Hypokalemia: Replace and follow. Check magnesium. 6. History of metastatic breast cancer: In remission. Continue letrozole. 7. Depression: Continue Effexor. No suicidal or homicidal ideations.   DVT prophylaxis: SCDs Code Status: Full Family Communication: None at bedside Disposition: DC home possibly 9/9   Consultants:  [facility]   Procedures:  EGD and variceal banding 7 on 9/6  Antimicrobials:  None    Subjective: Seen this morning. Tolerating diet without any further vomiting in the hospital. Had a couple dark stools yesterday but none since then. No dizziness, lightheadedness even with ambulation.  ROS: No chest pain, dyspnea or palpitations reported.  Objective:  Vitals:   09/06/17 0500 09/06/17 0505 09/06/17 0510 09/06/17 0940  BP: (!) 83/44 (!) 80/45 (!) 108/48 (!) 112/50  Pulse: 67   70  Resp: 14     Temp: 98.1 F (36.7 C)     TempSrc: Oral     SpO2: 100%     Weight:      Height:        Examination:  General exam: Pleasant middle-aged female lying comfortably supine in bed.Does not appear in distress. Respiratory system: Clear to auscultation. Respiratory effort normal. Stable. Cardiovascular system: S1 & S2 heard, RRR. No JVD, murmurs, rubs, gallops or clicks. No pedal edema.  Gastrointestinal system: Abdomen is nondistended, soft and nontender. No organomegaly or masses felt. Normal bowel sounds heard. Stable. Central nervous system: Alert  and oriented. No focal neurological deficits. Stable. Extremities: Symmetric 5 x 5 power. Skin: No rashes, lesions or ulcers Psychiatry: Judgement and insight appear normal. Mood & affect appropriate.     Data  Reviewed: I have personally reviewed following labs and imaging studies  CBC:  Recent Labs Lab 09/04/17 0510 09/04/17 1942 09/05/17 0255 09/05/17 0602 09/06/17 0529  WBC 13.0* 8.9 7.0 5.7 4.7  NEUTROABS 10.5*  --   --   --   --   HGB 11.5* 9.9* 7.5* 8.2* 7.8*  HCT 33.2* 30.0* 21.3* 23.7* 23.6*  MCV 81.6 84.5 81.6 82.9 84.0  PLT 141* 106* 73* 73* 71*   Basic Metabolic Panel:  Recent Labs Lab 09/04/17 0510 09/05/17 0602 09/06/17 0529  NA 141 143 141  K 3.4* 3.3* 3.4*  CL 113* 118* 114*  CO2 17* 17* 20*  GLUCOSE 134* 109* 102*  BUN 51* 16 9  CREATININE 1.00 0.88 0.82  CALCIUM 7.9* 7.1* 7.3*   Liver Function Tests:  Recent Labs Lab 09/04/17 0510 09/05/17 0602  AST 25 25  ALT 23 21  ALKPHOS 42 35*  BILITOT 0.9 0.5  PROT 5.5* 5.2*  ALBUMIN 3.4* 3.3*   Coagulation Profile:  Recent Labs Lab 09/04/17 0510  INR 1.17   Cardiac Enzymes: No results for input(s): CKTOTAL, CKMB, CKMBINDEX, TROPONINI in the last 168 hours. HbA1C: No results for input(s): HGBA1C in the last 72 hours. CBG: No results for input(s): GLUCAP in the last 168 hours.  Recent Results (from the past 240 hour(s))  MRSA PCR Screening     Status: None   Collection Time: 09/04/17  5:49 PM  Result Value Ref Range Status   MRSA by PCR NEGATIVE NEGATIVE Final    Comment:        The GeneXpert MRSA Assay (FDA approved for NASAL specimens only), is one component of a comprehensive MRSA colonization surveillance program. It is not intended to diagnose MRSA infection nor to guide or monitor treatment for MRSA infections.          Radiology Studies: No results found.      Scheduled Meds: . letrozole  2.5 mg Oral Daily  . magnesium oxide  400 mg Oral Daily  . pantoprazole  40 mg Oral Daily  . potassium chloride  30 mEq Oral Q4H  . propranolol  20 mg Oral BID  . venlafaxine XR  150 mg Oral Q breakfast   Continuous Infusions: . octreotide  (SANDOSTATIN)    IV infusion 50 mcg/hr  (09/06/17 3428)     LOS: 2 days     HONGALGI,ANAND, MD, FACP, FHM. [facility] Pager [phone]  If 7PM-7AM, please contact night-coverage [email or web address] Password [email or web address] 09/06/2017, 1:47 PM

## 2017-09-07 DIAGNOSIS — K922 Gastrointestinal hemorrhage, unspecified: Secondary | ICD-10-CM

## 2017-09-07 DIAGNOSIS — K766 Portal hypertension: Secondary | ICD-10-CM

## 2017-09-07 LAB — BASIC METABOLIC PANEL
Anion gap: 6 (ref 5–15)
BUN: 10 mg/dL (ref 6–20)
CHLORIDE: 114 mmol/L — AB (ref 101–111)
CO2: 21 mmol/L — ABNORMAL LOW (ref 22–32)
CREATININE: 0.9 mg/dL (ref 0.44–1.00)
Calcium: 7.6 mg/dL — ABNORMAL LOW (ref 8.9–10.3)
Glucose, Bld: 107 mg/dL — ABNORMAL HIGH (ref 65–99)
Potassium: 4 mmol/L (ref 3.5–5.1)
SODIUM: 141 mmol/L (ref 135–145)

## 2017-09-07 LAB — CBC
HCT: 23.6 % — ABNORMAL LOW (ref 36.0–46.0)
HEMOGLOBIN: 7.8 g/dL — AB (ref 12.0–15.0)
MCH: 28.4 pg (ref 26.0–34.0)
MCHC: 33.1 g/dL (ref 30.0–36.0)
MCV: 85.8 fL (ref 78.0–100.0)
Platelets: 74 10*3/uL — ABNORMAL LOW (ref 150–400)
RBC: 2.75 MIL/uL — ABNORMAL LOW (ref 3.87–5.11)
RDW: 18.2 % — ABNORMAL HIGH (ref 11.5–15.5)
WBC: 4.5 10*3/uL (ref 4.0–10.5)

## 2017-09-07 MED ORDER — PANTOPRAZOLE SODIUM 40 MG PO TBEC: 40.0000 mg | DELAYED_RELEASE_TABLET | Freq: Every day | ORAL | 0 refills | Status: DC

## 2017-09-07 MED ORDER — PROPRANOLOL HCL 20 MG PO TABS: 20.0000 mg | ORAL_TABLET | Freq: Two times a day (BID) | ORAL | 0 refills | Status: DC

## 2017-09-07 NOTE — Progress Notes (Signed)
[name] to be D/C'd home per MD order.  Discussed with the patient and all questions fully answered.  VSS, Skin clean, dry and intact without evidence of skin break down, no evidence of skin tears noted. IV catheter discontinued intact. Site without signs and symptoms of complications. Dressing and pressure applied.  An After Visit Summary was printed and given to the patient. Patient received prescription.  D/c education completed with patient/family including follow up instructions, medication list, d/c activities limitations if indicated, with other d/c instructions as indicated by MD - patient able to verbalize understanding, all questions fully answered.   Patient instructed to return to ED, call 911, or call MD for any changes in condition.   Patient escorted via [place], and D/C home via private auto.  Milas Hock 09/07/2017 3:19 PM

## 2017-09-07 NOTE — Discharge Summary (Addendum)
Physician Discharge Summary  Nicole [name] WUJ:[id number] DOB: [birth year]  PCP: Aretta Nip, MD  Admit date: 09/04/2017 Discharge date: 09/07/2017  Recommendations for Outpatient Follow-up:  1. Dr. Milagros Evener, PCP in 5 days with repeat labs (CBC & BMP). 2. [facility]: In 3 weeks   Home Health: None Equipment/Devices: None    Discharge Condition: Improved and stable  CODE STATUS: Full  Diet recommendation: Heart healthy diet.  Discharge Diagnoses:  Principal Problem:   Hematemesis Active Problems:   Breast cancer metastasized to lung (HCC)   Esophageal varices (HCC)   Iron deficiency anemia   Melena   Thrombocytopenia (HCC)   GI bleed   Idiopathic cirrhosis (HCC)   Portal hypertension (HCC)   Brief Summary: 61 year old female with history of metastatic breast cancer, status post right mastectomy, as per oncology follow-up 04/2017 no evidence of active disease, left jugular vein DVT for which she completed Coumadin in 2015, osteoporosis, portal hypertension of unclear etiology and no definitive diagnosis of cirrhosis, gastric varices, iron deficiency anemia presented to the ED on 09/04/17 with an episode of large coffee-ground/maroon looking emesis and melena. GI consulted, status post EGD and multiple variceal banding 9/6.   Assessment & Plan:   1. Acute upper GI/variceal bleed: [facility] was consulted. Patient underwent EGD and 7 variceal bands placed 9/6. As per GI, although patient has esophageal and gastric varesis, thrombocytopenia, recent CT scan June 2018 showed no apparent evidence of cirrhosis, liver biopsy December 2017 showed minimal portal inflammation and no evidence of cirrhosis. Protonix was transitioned to oral. Completed 3 days of IV octreotide infusion. Low-dose propranolol was added and patient tolerated same. No further vomiting since admission. Melena resolved. GI has seen today and cleared for discharge with once daily PPI, low-dose propranolol,  outpatient follow-up with GI in 3-4 weeks and she will need repeat EGD in 4-6 weeks for further band ligation.  2. Portal hypertension: As stated above, unclear etiology,? Related to breast cancer treatment/radiation. Complicated by esophageal and gastric paresis. Propranolol started during this admission. Surveillance EGD as outpatient. 3. Acute blood loss anemia: Complicating iron deficiency anemia. Hemoglobin dropped from 11.5-7.5. Monitor closely and transfuse if hemoglobin <7 g per DL. Hemoglobin has been stable over the last 3 days. Follow CBC in a few days as outpatient. 4. Thrombocytopenia: Acute on chronic related to acute bleeding. Stable in the 70s. Follow CBC as outpatient 5. Hypokalemia: Replaced. Magnesium normal. 6. History of metastatic breast cancer: In remission. Continue letrozole. Outpatient follow-up with oncology. 7. Depression: Continue Effexor. No suicidal or homicidal ideations. Stable.    Consultants:  [facility]   Procedures:  EGD and variceal banding 7 on 9/6   Discharge Instructions  Discharge Instructions    Call MD for:    Complete by:  As directed    Vomiting blood or coffee-ground material. Persistent or recurrent black tarry stools.   Call MD for:  difficulty breathing, headache or visual disturbances    Complete by:  As directed    Call MD for:  extreme fatigue    Complete by:  As directed    Call MD for:  persistant dizziness or light-headedness    Complete by:  As directed    Call MD for:  persistant nausea and vomiting    Complete by:  As directed    Diet - low sodium heart healthy    Complete by:  As directed    Increase activity slowly    Complete by:  As directed  Medication List    TAKE these medications   CALCIUM 600 + D PO Take 1 tablet by mouth daily.   denosumab 60 MG/ML Soln injection Commonly known as:  PROLIA Inject 60 mg into the skin every 6 (six) months. Administer in upper arm, thigh, or abdomen   letrozole  2.5 MG tablet Commonly known as:  FEMARA Take 1 tablet (2.5 mg total) by mouth daily.   Magnesium 500 MG Tabs Take 500 mg by mouth daily.   pantoprazole 40 MG tablet Commonly known as:  PROTONIX Take 1 tablet (40 mg total) by mouth daily.   propranolol 20 MG tablet Commonly known as:  INDERAL Take 1 tablet (20 mg total) by mouth 2 (two) times daily.   venlafaxine XR 150 MG 24 hr capsule Commonly known as:  EFFEXOR-XR Take 1 capsule (150 mg total) by mouth daily with breakfast.   Vitamin D3 5000 units Caps Take 5,000 Units by mouth daily.   VITAMIN E PO Take 1 capsule by mouth daily.      Follow-up Information    [facility]. Schedule an appointment as soon as possible for a visit in 3 week(s).   Contact information: [place] [address] [phone]        Rankins, Bill Salinas, MD. Schedule an appointment as soon as possible for a visit in 5 day(s).   Specialty:  Family Medicine Why:  To be seen with repeat labs (CBC + BMP). Contact information: [place] [address] [phone]          No Known Allergies    Procedures/Studies: No results found.    Subjective: No vomiting or abdominal pain. Tolerating diet. Last BM overnight and stools now normal color. No blood in stools. Ambulating without difficulty. No dizziness, lightheadedness, chest pain, dyspnea or palpitations reported. As per RN, no acute issues.  Discharge Exam:  Vitals:   09/06/17 1445 09/06/17 2038 09/07/17 0622 09/07/17 0945  BP: (!) 97/59 118/63 (!) 96/53 (!) 112/0  Pulse: 68 66 60 68  Resp: 18 16 16    Temp: 98.8 F (37.1 C) 99.2 F (37.3 C) 97.9 F (36.6 C)   TempSrc: Oral     SpO2: 100% 100% 98%   Weight:      Height:        General exam: Pleasant middle-aged female lying comfortably supine in bed. Respiratory system: Clear to auscultation. Respiratory effort normal.  Cardiovascular system: S1 & S2 heard, RRR. No  JVD, murmurs, rubs, gallops or clicks. No pedal edema.  Gastrointestinal system: Abdomen is nondistended, soft and nontender. No organomegaly or masses felt. Normal bowel sounds heard.  Central nervous system: Alert and oriented. No focal neurological deficits.  Extremities: Symmetric 5 x 5 power. Skin: No rashes, lesions or ulcers Psychiatry: Judgement and insight appear normal. Mood & affect appropriate.    The results of significant diagnostics from this hospitalization (including imaging, microbiology, ancillary and laboratory) are listed below for reference.     Microbiology: Recent Results (from the past 240 hour(s))  MRSA PCR Screening     Status: None   Collection Time: 09/04/17  5:49 PM  Result Value Ref Range Status   MRSA by PCR NEGATIVE NEGATIVE Final    Comment:        The GeneXpert MRSA Assay (FDA approved for NASAL specimens only), is one component of a comprehensive MRSA colonization surveillance program. It is not intended to diagnose MRSA infection nor to guide  or monitor treatment for MRSA infections.      Labs: CBC:  Recent Labs Lab 09/04/17 0510 09/04/17 1942 09/05/17 0255 09/05/17 0602 09/06/17 0529 09/07/17 0453  WBC 13.0* 8.9 7.0 5.7 4.7 4.5  NEUTROABS 10.5*  --   --   --   --   --   HGB 11.5* 9.9* 7.5* 8.2* 7.8* 7.8*  HCT 33.2* 30.0* 21.3* 23.7* 23.6* 23.6*  MCV 81.6 84.5 81.6 82.9 84.0 85.8  PLT 141* 106* 73* 73* 71* 74*   Basic Metabolic Panel:  Recent Labs Lab 09/04/17 0510 09/05/17 0602 09/06/17 0529 09/06/17 1406 09/07/17 0453  NA 141 143 141  --  141  K 3.4* 3.3* 3.4*  --  4.0  CL 113* 118* 114*  --  114*  CO2 17* 17* 20*  --  21*  GLUCOSE 134* 109* 102*  --  107*  BUN 51* 16 9  --  10  CREATININE 1.00 0.88 0.82  --  0.90  CALCIUM 7.9* 7.1* 7.3*  --  7.6*  MG  --   --   --  2.0  --    Liver Function Tests:  Recent Labs Lab 09/04/17 0510 09/05/17 0602  AST 25 25  ALT 23 21  ALKPHOS 42 35*  BILITOT 0.9 0.5  PROT  5.5* 5.2*  ALBUMIN 3.4* 3.3*       Time coordinating discharge: Less than 30 minutes  SIGNED:  Vernell Leep, MD, FACP, FHM. [facility] Pager [phone]  If 7PM-7AM, please contact night-coverage [email or web address] Password [email or web address] 09/07/2017, 12:32 PM

## 2017-09-07 NOTE — Progress Notes (Signed)
[facility] Progress Note  Nicole [name] 61 y.o. [birth year]  CC:  Hematemesis and melena   Subjective:  Patient monitoring episode of black color stool. Denied abdominal pain. Substernal burning is improving. Denied nausea and vomiting.  ROS : Negative for chest pain and shortness of breath   Objective: Vital signs in last 24 hours: Vitals:   09/07/17 0622 09/07/17 0945  BP: (!) 96/53 (!) 112/0  Pulse: 60 68  Resp: 16   Temp: 97.9 F (36.6 C)   SpO2: 98%     Physical Exam:  General:  Alert, cooperative, no distress, appears stated age  Head:  Normocephalic, without obvious abnormality, atraumatic  Eyes:  , EOM's intact,   Lungs:   Clear to auscultation bilaterally, respirations unlabored  Heart:  Regular rate and rhythm, S1, S2 normal  Abdomen:   Soft, non-tender, bowel sounds active all four quadrants,  no masses,   Extremities: Extremities normal, atraumatic, no  edema       Lab Results:  Recent Labs  09/06/17 0529 09/06/17 1406 09/07/17 0453  NA 141  --  141  K 3.4*  --  4.0  CL 114*  --  114*  CO2 20*  --  21*  GLUCOSE 102*  --  107*  BUN 9  --  10  CREATININE 0.82  --  0.90  CALCIUM 7.3*  --  7.6*  MG  --  2.0  --     Recent Labs  09/05/17 0602  AST 25  ALT 21  ALKPHOS 35*  BILITOT 0.5  PROT 5.2*  ALBUMIN 3.3*    Recent Labs  09/06/17 0529 09/07/17 0453  WBC 4.7 4.5  HGB 7.8* 7.8*  HCT 23.6* 23.6*  MCV 84.0 85.8  PLT 71* 74*   No results for input(s): LABPROT, INR in the last 72 hours.    Assessment/Plan: - Hematemesis from esophageal varices. Status post EGD with 7 bands placement yesterday.  - History of portal hypertension from unknown etiology complicated by esophageal and gastric varices.  - S/P BRTO for gastric varices in 2017  Recommendations ------------------------- - D/C  octreotide. - Continue once a day PPI. - Continue low-dose propranolol, hold for  heart rate less than 55 and blood pressure less  than 90/50 - okay to discharge from GI standpoint. Follow-up in GI clinic in 3-4 weeks after discharge. - She will need repeat EGD in 4-6 week for further band ligation.  -  Although patient has esophageal and gastric varices and  Thrombocytopenia,  recent CT scan in June 2018 showed no apparent evidence of cirrhosis, also liver biopsy in December 2017 showed minimal portal inflammation and no evidence of cirrhosis.   GI will sign off. Call us back if needed  Otis Brace MD, FACP 09/07/2017, 10:08 AM  Pager [phone]  If no answer or after 5 PM call [phone]

## 2017-09-07 NOTE — Discharge Instructions (Signed)

## 2017-09-10 DIAGNOSIS — K922 Gastrointestinal hemorrhage, unspecified: Secondary | ICD-10-CM | POA: Diagnosis not present

## 2017-09-10 DIAGNOSIS — F418 Other specified anxiety disorders: Secondary | ICD-10-CM | POA: Diagnosis not present

## 2017-09-12 ENCOUNTER — Encounter (HOSPITAL_COMMUNITY): Payer: Self-pay | Admitting: Gastroenterology

## 2017-09-25 ENCOUNTER — Other Ambulatory Visit: Payer: Self-pay | Admitting: Gastroenterology

## 2017-09-25 DIAGNOSIS — I8500 Esophageal varices without bleeding: Secondary | ICD-10-CM | POA: Diagnosis not present

## 2017-09-25 DIAGNOSIS — K766 Portal hypertension: Secondary | ICD-10-CM | POA: Diagnosis not present

## 2017-09-26 ENCOUNTER — Other Ambulatory Visit: Payer: Self-pay | Admitting: Family Medicine

## 2017-09-26 DIAGNOSIS — Z9011 Acquired absence of right breast and nipple: Secondary | ICD-10-CM

## 2017-09-26 DIAGNOSIS — Z9012 Acquired absence of left breast and nipple: Secondary | ICD-10-CM

## 2017-09-26 DIAGNOSIS — Z1231 Encounter for screening mammogram for malignant neoplasm of breast: Secondary | ICD-10-CM

## 2017-11-06 ENCOUNTER — Encounter: Payer: Self-pay | Admitting: Interventional Radiology

## 2017-11-11 NOTE — Progress Notes (Signed)
ID: Nicole [name]   DOB: 05-Nov-1956  MR#: [id number]  GNF#:[id number]  PCP:  GYN:  SURolm Bookbinder, MD OTHER MD: Gery Pray, MD  CHIEF COMPLAINT:  Metastatic Breast Cancer  CURRENT TREATMENT: Letrozole, denosumab/Prolia, Feraheme  INTERVAL HISTORY: Nicole [name] returns today for follow-up and treatment of her estrogen receptor positive stage IV breast cancer.  Her husband is with her.  She continues on letrozole, with good tolerance. She reports having hot flashes that do not occur everyday and  don't wake her up. She notes some vaginal dryness that she tolerates.   She also receives denosumab/Prolia every 6 months, for treatment of osteoporosis (she is not known to have metastatic disease to bone).  She has a dose due today. She reports no problems from the injections.  She also has a history of iron deficiency anemia.  She received Feraheme x2 in June 2018.  She tolerated the Feraheme without any unusual side effects.  Aqueelah had restaging scans most recently in June 2018, showing no active disease.  More specifically, she had a chest CT with contrast on 06/25/2017 with results showing: Status post right mastectomy without CT findings for chest wall recurrence or axillary or supraclavicular disease. Radiation changes involving the right anterior lung. Small pulmonary nodules are mainly stable. The left lower lobe nodule is slightly larger. Recommend followup noncontrast chest CT in 6 months. Sclerotic appearance of the mid sternum may actually be collateral vasculature. Similar findings in the cervical spine. I do not see any definite findings for osseous metastatic disease. No mediastinal or hilar mass or lymphadenopathy. Stable cirrhotic changes involving the liver and splenomegaly. No findings for upper abdominal metastatic disease.  On 06/12/2017, she completed a CT of the abdomen wit and without contrast with results showing: No significant gastric varices. The previously identified large  gastric varices have been successfully obliterated. Evidence for small distal esophageal varices. Stable enlargement of spleen.  No evidence for overt cirrhosis.     REVIEW OF SYSTEMS:  Tracina reports that she is well overall other than her major GI bleeding problems that occured in September 2018. She missed her niece's wedding on September 8th due to being hospitalized for these complications. She notes that she was throwing up blood and had an upper GI bleed. Dr. Oletta Lamas completed an upper endoscopy in March. She notes that she had iron infusions in June that she tolerated well. She reports that she is scheduled for another endoscopy on 11/27/2017. She reports that she was placed on propanolol for her BP. She notes that she does not have heartburn or reflux. She denies unusual headaches, visual changes, nausea, vomiting, or dizziness. There has been no unusual cough, phlegm production, or pleurisy. This been no change in bowel or bladder habits. She denies unexplained fatigue or unexplained weight loss, bleeding, rash, or fever. A detailed review of systems was otherwise stable.   BREAST CANCER HISTORY: From Dr. Collier Salina Rubin's 07/25/2010 note:  "This woman has not had any significant medical intervention for some time.  Her last mammogram was about seven years ago.  She noted a right breast mass about a year ago and did not seek immediate medical attention for this.  She ultimately, after some time, admitted this to her husband and self-referred herself for intervention.  She had a mammogram on 07/17/2010 with an ultrasound of the right breast.  This showed a lobulated mass upper right outer quadrant measuring at least 6.3 x 7.3 cm and large right axillary lymph nodes were also  noted.  There was skin thickening overlying the mass.  On physical exam, this mass was about an 8 cm with skin dimpling noted. Discolored area in the skin over the mass, fullness in the axilla.  Biopsy was recommended, which took  place on 07/17/2010.  Pathology showed invasive ductal cancer involving both lymph node and breast.  The HER-2 was not amplified.  ER and PR both positive at 100% and 6% respectively.  Proliferative index was 12% involving both the lymph node and breast mass.  An MRI scan of both breasts was performed on 07/22/2010, essentially which showed a large heterogeneously enhancing mass of the right breast with washout kinetics measuring 6.4 x 4.4 x 5.0 cm.  Numerous satellite nodules were seen throughout the dominant mass.  There were several suspicious nodules in the upper central and upper inner quadrant of the right breast.  There were two enhancing subcentimeter nodules seen in the right pectoralis muscle.  No obvious chest wall nodules were seen; however, there was seen some metastatic adenopathy in the mediastinum as well as hila with a 2.4 x 2.0 cm mediastinal lymph node as well as 1.2 x 3.0 cm subcarinal lymph node.  There was a right hilar and probable left hilar adenopathy.  Bilateral pulmonary nodules were seen and a right T2 lesion was seen anterior right liver as well."  Her subsequent history is as detailed below.  PAST MEDICAL HISTORY: Past Medical History:  Diagnosis Date  . Blood transfusion 2012  . Breast CA ([place])    breast ca dx 06/2010, stage 4, right , Lung Metasis  . Clot    jugular  ?2015  . Depression   . Esophageal varices ([place])   . Headache    Mirgraine- rare since menopause  . Heart murmur    mild, no cardiologist  . History of kidney stones    no pain- shows in CT- small 1 mm bilaterally- kidney  . IBD (inflammatory bowel disease)    resolved  . Peripheral vascular disease ([place])   . Radiation 07/21/12-09/07/12   5940 cGy    PAST SURGICAL HISTORY: Past Surgical History:  Procedure Laterality Date  . COLONOSCOPY    . DILATION AND CURETTAGE OF UTERUS  2004?  . ESOPHAGOGASTRODUODENOSCOPY (EGD) WITH PROPOFOL N/A 04/11/2016   Procedure: ESOPHAGOGASTRODUODENOSCOPY (EGD) WITH  PROPOFOL;  Surgeon: Clarene Essex, MD;  Location: [facility];  Service: Endoscopy;  Laterality: N/A;  . ESOPHAGOGASTRODUODENOSCOPY (EGD) WITH PROPOFOL N/A 07/08/2016   Procedure: ESOPHAGOGASTRODUODENOSCOPY (EGD) WITH PROPOFOL;  Surgeon: Laurence Spates, MD;  Location: [place];  Service: Endoscopy;  Laterality: N/A;  . IR GENERIC HISTORICAL  05/15/2016   IR RADIOLOGIST EVAL & MGMT 05/15/2016 Corrie Mckusick, DO [facility] INTERV RAD  . MASTECTOMY  06/02/12   right breast  . PORT-A-CATH REMOVAL    . PORTACATH PLACEMENT  2011   left side  . RADIOLOGY WITH ANESTHESIA N/A 04/13/2016   Procedure: RADIOLOGY WITH ANESTHESIA;  Surgeon: Corrie Mckusick, DO;  Location: [place];  Service: Anesthesiology;  Laterality: N/A;    FAMILY HISTORY Family History  Problem Relation Age of Onset  . Cancer Neg Hx   . COPD Mother   . AAA (abdominal aortic aneurysm) Mother   . COPD Father    patient's father died at the age of 67 from heart disease. Patient's mother died at the age of 35 status post CABG. The patient had no brothers, 2 sisters. There is no history of breast or ovarian cancer in the family.  GYNECOLOGIC  HISTORY: Updated May 2018 Menarche age 31, she is GX P0. She does not recall when she went through menopause. She never took hormone replacement.  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 She retired in November 2017 Husband Merry Proud is retired from [employer]. They have no children and no pets. They attend a local [place].   ADVANCED DIRECTIVES: Not in place  HEALTH MAINTENANCE: (updated 03/14/2014)  Social History  Substance Use Topics  . Smoking status: Never Smoker  . Smokeless tobacco: Never Used  . Alcohol use No     Colonoscopy: Never  PAP: Remote  Bone density: 09/27/2013, osteoporosis with a T score of -2.8  Lipid panel: Not on file   No Known Allergies  Current Outpatient Medications:  .  Calcium  Carb-Cholecalciferol (CALCIUM 600 + D PO), Take 1 tablet by mouth daily., Disp: , Rfl:  .  Cholecalciferol (VITAMIN D3) 5000 units CAPS, Take 5,000 Units by mouth daily., Disp: , Rfl:  .  denosumab (PROLIA) 60 MG/ML SOLN injection, Inject 60 mg into the skin every 6 (six) months. Administer in upper arm, thigh, or abdomen, Disp: , Rfl:  .  letrozole (FEMARA) 2.5 MG tablet, Take 1 tablet (2.5 mg total) by mouth daily., Disp: 90 tablet, Rfl: 0 .  Magnesium 500 MG TABS, Take 500 mg by mouth daily., Disp: , Rfl:  .  pantoprazole (PROTONIX) 40 MG tablet, Take 1 tablet (40 mg total) by mouth daily., Disp: 30 tablet, Rfl: 0 .  propranolol (INDERAL) 20 MG tablet, Take 1 tablet (20 mg total) by mouth 2 (two) times daily., Disp: 60 tablet, Rfl: 0 .  venlafaxine XR (EFFEXOR-XR) 150 MG 24 hr capsule, Take 1 capsule (150 mg total) by mouth daily with breakfast., Disp: 30 capsule, Rfl: 3 .  VITAMIN E PO, Take 1 capsule by mouth daily., Disp: , Rfl:  No current facility-administered medications for this visit.   Facility-Administered Medications Ordered in Other Visits:  .  denosumab (PROLIA) injection 60 mg, 60 mg, Subcutaneous, Once, Shamonique Battiste, Virgie Dad, MD .  denosumab (PROLIA) injection 60 mg, 60 mg, Subcutaneous, Once, Raileigh Sabater, Virgie Dad, MD No current facility-administered medications for this visit.     OBJECTIVE: Middle-aged white woman in no acute distress  Vitals:   11/17/17 1429  BP: 126/79  Pulse: 70  Resp: 20  Temp: 98.1 F (36.7 C)  TempSrc: Oral  SpO2: 100%  Weight: 134 lb 12.8 oz (61.1 kg)  Height: _0  (1.626 m)   Sclerae unicteric, EOMs intact Oropharynx clear and moist No cervical or supraclavicular adenopathy Lungs no rales or rhonchi Heart regular rate and rhythm Abd soft, nontender, positive bowel sounds MSK no focal spinal tenderness, no upper extremity lymphedema Neuro: nonfocal, generally well oriented (though she thought this morning today was Sunday)., appropriate  affect Breasts: The right breast is status post mastectomy and radiation.  There are significant telangiectasias.  There is no evidence of disease recurrence.  The left breast is benign.  Both axillae are benign.  LAB RESULTS: Lab Results  Component Value Date   WBC 3.8 05/09/2016   NEUTROABS 5.0 05/02/2016   HGB 10.9 (L) 05/09/2016   HCT 34.8 (L) 05/09/2016   MCV 92.1 05/09/2016   PLT 98 (L) 05/09/2016      Chemistry    CMP Latest Ref Rng & Units 11/17/2017 09/07/2017 09/06/2017  Glucose 70 - 140 mg/dl 86 107(H) 102(H)  BUN 7.0 - 26.0 mg/dL 12._0 Creatinine 0.6 - 1.1 mg/dL 1.1 0.90 0.82  Sodium 136 - 145 mEq/L 142 141 141  Potassium 3.5 - 5.1 mEq/L 4.4 4.0 3.4(L)  Chloride 101 - 111 mmol/L - 114(H) 114(H)  CO2 22 - 29 mEq/L 22 21(L) 20(L)  Calcium 8.4 - 10.4 mg/dL 9.2 7.6(L) 7.3(L)  Total Protein 6.4 - 8.3 g/dL 7.0 - -  Total Bilirubin 0.20 - 1.20 mg/dL 0.49 - -  Alkaline Phos 40 - 150 U/L 55 - -  AST 5 - 34 U/L 23 - -  ALT 0 - 55 U/L 22 - -     STUDIES: Since her last visit to the office, Morena had a chest CT with contrast on 06/25/2017 with results showing: Status post right mastectomy without CT findings for chest wall recurrence or axillary or supraclavicular disease. Radiation changes involving the right anterior lung. Small pulmonary nodules are mainly stable. The left lower lobe nodule is slightly larger. Recommend followup noncontrast chest CT in 6 months. Sclerotic appearance of the mid sternum may actually be collateral vasculature. Similar findings in the cervical spine. I do not see any definite findings for osseous metastatic disease. No mediastinal or hilar mass or lymphadenopathy. Stable cirrhotic changes involving the liver and splenomegaly. No findings for upper abdominal metastatic disease.  On 06/12/2017, she completed a CT of the abdomen wit and without contrast with results showing: No significant gastric varices. The previously identified large gastric  varices have been successfully obliterated. Evidence for small distal esophageal varices.   ASSESSMENT: 61 y.o. [place] woman presenting June 2011 with stage IV breast cancer involving the right breast upper outer quadrant, right axilla, mediastinal lymph nodes, and both lungs, but not the brain, liver, or bones  (1) positive right breast and right axillary lymph node biopsies 07/17/2010 of a clinical T4 N2 M1 invasive ductal carcinoma, grade 1, estrogen receptor 100% positive, progesterone receptor 6% positive, with an MIB-1 of 12%, and no HER-2 amplification.  (2) participated in Phase II tessetaxel study, receiving 2 cycles complicated by thrombocytopenia, anemia requiring transfusion, transaminase elevation, and afebrile neutropenia. Off-study as of September 2011. Chest CT scan September 2011 did show evidence of response.  (3) on letrozole as of October 2011, with continuing response and good tolerance   (4) 06/02/2012 underwent right mastectomy and axillary lymph node sampling (3 lymph nodes removed, all with viable cancer as well as evidence of treatment effect) for a ypT2 ypN1-2 invasive ductal carcinoma, grade 1, 98% estrogen receptor positive, progesterone receptor negative, with no HER-2 amplification  (5) left jugular vein DVT documented 06/16/2012; left sided Port-A-Cath removed mid July 2015; Coumadin stopped 09/07/2014.  (a) Left brachiocephalic v. slightly narrowed, with some Left lower neck collateralization noted on chest CT December 2015  (6) osteoporosis, was on alendronate, switched to denosumab/Prolia 11/07/2016  (7) cirrhosis, with splenomegaly, gastric varices, and intestinal edema  (a)  mild persistent leukopenia and thrombocytopenia likely due to splenomegaly   PLAN:  Shaleta a little more than 7 years out from definitive diagnosis of metastatic breast cancer, and is clinically doing very well.  As far as the breast cancer is concerned, we are going to repeat a CT  scan of the chest in 6 months to make sure there is slight change noted in one lung lesion was not significant.  She will continue the letrozole until there is evidence of disease progression.  She is also on Prolia.  She is tolerating that well.  She will receive a dose today and in 6 months.  She had a significant variceal bleed and that must be the reason why her MCV has dropped.  We are setting her up for Feraheme injections 12/03/2017 and 12/10/2017.  She is aware that iron deficiency as a cause of restless legs and her restless legs problem may get better once she gets her iron repleted.  She knows to call for any other issues that may develop before this visit  Limmie Schoenberg, Virgie Dad, MD  11/17/17 3:08 PM Medical Oncology and Hematology [facility] [place], [place] [address] Tel. [phone]    Fax. [phone]  This document serves as a record of services personally performed by Lurline Del, MD. It was created on his behalf by Sheron Nightingale, a trained medical scribe. The creation of this record is based on the scribe's personal observations and the provider's statements to them.   I have reviewed the above documentation for accuracy and completeness, and I agree with the above.

## 2017-11-17 ENCOUNTER — Telehealth: Payer: Self-pay | Admitting: Oncology

## 2017-11-17 ENCOUNTER — Ambulatory Visit (HOSPITAL_BASED_OUTPATIENT_CLINIC_OR_DEPARTMENT_OTHER): Payer: BLUE CROSS/BLUE SHIELD

## 2017-11-17 ENCOUNTER — Other Ambulatory Visit (HOSPITAL_BASED_OUTPATIENT_CLINIC_OR_DEPARTMENT_OTHER): Payer: BLUE CROSS/BLUE SHIELD

## 2017-11-17 ENCOUNTER — Ambulatory Visit (HOSPITAL_BASED_OUTPATIENT_CLINIC_OR_DEPARTMENT_OTHER): Payer: BLUE CROSS/BLUE SHIELD | Admitting: Oncology

## 2017-11-17 VITALS — BP 126/79 | HR 70 | Temp 98.1°F | Resp 20 | Ht 64.0 in | Wt 134.8 lb

## 2017-11-17 DIAGNOSIS — C50411 Malignant neoplasm of upper-outer quadrant of right female breast: Secondary | ICD-10-CM | POA: Diagnosis not present

## 2017-11-17 DIAGNOSIS — Z170 Estrogen receptor positive status [ER+]: Secondary | ICD-10-CM

## 2017-11-17 DIAGNOSIS — C7800 Secondary malignant neoplasm of unspecified lung: Secondary | ICD-10-CM

## 2017-11-17 DIAGNOSIS — D500 Iron deficiency anemia secondary to blood loss (chronic): Secondary | ICD-10-CM | POA: Diagnosis not present

## 2017-11-17 DIAGNOSIS — M810 Age-related osteoporosis without current pathological fracture: Secondary | ICD-10-CM | POA: Diagnosis not present

## 2017-11-17 DIAGNOSIS — Z7189 Other specified counseling: Secondary | ICD-10-CM

## 2017-11-17 DIAGNOSIS — M818 Other osteoporosis without current pathological fracture: Secondary | ICD-10-CM

## 2017-11-17 DIAGNOSIS — C50919 Malignant neoplasm of unspecified site of unspecified female breast: Secondary | ICD-10-CM

## 2017-11-17 LAB — CBC WITH DIFFERENTIAL/PLATELET
BASO%: 0.6 % (ref 0.0–2.0)
Basophils Absolute: 0 10*3/uL (ref 0.0–0.1)
EOS%: 0.1 % (ref 0.0–7.0)
Eosinophils Absolute: 0 10*3/uL (ref 0.0–0.5)
HCT: 39.6 % (ref 34.8–46.6)
HGB: 12.6 g/dL (ref 11.6–15.9)
LYMPH%: 20.9 % (ref 14.0–49.7)
MCH: 25 pg — ABNORMAL LOW (ref 25.1–34.0)
MCHC: 31.7 g/dL (ref 31.5–36.0)
MCV: 79.1 fL — ABNORMAL LOW (ref 79.5–101.0)
MONO#: 0.4 10*3/uL (ref 0.1–0.9)
MONO%: 8.9 % (ref 0.0–14.0)
NEUT%: 69.5 % (ref 38.4–76.8)
NEUTROS ABS: 3.2 10*3/uL (ref 1.5–6.5)
PLATELETS: 121 10*3/uL — AB (ref 145–400)
RBC: 5.01 10*6/uL (ref 3.70–5.45)
RDW: 18.6 % — ABNORMAL HIGH (ref 11.2–14.5)
WBC: 4.7 10*3/uL (ref 3.9–10.3)
lymph#: 1 10*3/uL (ref 0.9–3.3)

## 2017-11-17 LAB — COMPREHENSIVE METABOLIC PANEL
ALBUMIN: 3.8 g/dL (ref 3.5–5.0)
ALK PHOS: 55 U/L (ref 40–150)
ALT: 22 U/L (ref 0–55)
AST: 23 U/L (ref 5–34)
Anion Gap: 9 mEq/L (ref 3–11)
BILIRUBIN TOTAL: 0.49 mg/dL (ref 0.20–1.20)
BUN: 12.5 mg/dL (ref 7.0–26.0)
CALCIUM: 9.2 mg/dL (ref 8.4–10.4)
CO2: 22 mEq/L (ref 22–29)
CREATININE: 1.1 mg/dL (ref 0.6–1.1)
Chloride: 111 mEq/L — ABNORMAL HIGH (ref 98–109)
EGFR: 52 mL/min/{1.73_m2} — ABNORMAL LOW (ref >60)
Glucose: 86 mg/dl (ref 70–140)
Potassium: 4.4 mEq/L (ref 3.5–5.1)
Sodium: 142 mEq/L (ref 136–145)
Total Protein: 7 g/dL (ref 6.4–8.3)

## 2017-11-17 MED ORDER — DENOSUMAB 60 MG/ML ~~LOC~~ SOLN
60.0000 mg | Freq: Once | SUBCUTANEOUS | Status: AC
  Administered 2017-11-17: 60 mg via SUBCUTANEOUS
  Filled 2017-11-17: qty 1

## 2017-11-17 NOTE — Patient Instructions (Signed)
Denosumab injection  What is this medicine?  DENOSUMAB (den oh sue mab) slows bone breakdown. Prolia is used to treat osteoporosis in women after menopause and in men. Xgeva is used to prevent bone fractures and other bone problems caused by cancer bone metastases. Xgeva is also used to treat giant cell tumor of the bone.  This medicine may be used for other purposes; ask your health care provider or pharmacist if you have questions.  What should I tell my health care provider before I take this medicine?  They need to know if you have any of these conditions:  -dental disease  -eczema  -infection or history of infections  -kidney disease or on dialysis  -low blood calcium or vitamin D  -malabsorption syndrome  -scheduled to have surgery or tooth extraction  -taking medicine that contains denosumab  -thyroid or parathyroid disease  -an unusual reaction to denosumab, other medicines, foods, dyes, or preservatives  -pregnant or trying to get pregnant  -breast-feeding  How should I use this medicine?  This medicine is for injection under the skin. It is given by a health care professional in a hospital or clinic setting.  If you are getting Prolia, a special MedGuide will be given to you by the pharmacist with each prescription and refill. Be sure to read this information carefully each time.  For Prolia, talk to your pediatrician regarding the use of this medicine in children. Special care may be needed. For Xgeva, talk to your pediatrician regarding the use of this medicine in children. While this drug may be prescribed for children as young as 13 years for selected conditions, precautions do apply.  Overdosage: If you think you have taken too much of this medicine contact a poison control center or emergency room at once.  NOTE: This medicine is only for you. Do not share this medicine with others.  What if I miss a dose?  It is important not to miss your dose. Call your doctor or health care professional if you are  unable to keep an appointment.  What may interact with this medicine?  Do not take this medicine with any of the following medications:  -other medicines containing denosumab  This medicine may also interact with the following medications:  -medicines that suppress the immune system  -medicines that treat cancer  -steroid medicines like prednisone or cortisone  This list may not describe all possible interactions. Give your health care provider a list of all the medicines, herbs, non-prescription drugs, or dietary supplements you use. Also tell them if you smoke, drink alcohol, or use illegal drugs. Some items may interact with your medicine.  What should I watch for while using this medicine?  Visit your doctor or health care professional for regular checks on your progress. Your doctor or health care professional may order blood tests and other tests to see how you are doing.  Call your doctor or health care professional if you get a cold or other infection while receiving this medicine. Do not treat yourself. This medicine may decrease your body's ability to fight infection.  You should make sure you get enough calcium and vitamin D while you are taking this medicine, unless your doctor tells you not to. Discuss the foods you eat and the vitamins you take with your health care professional.  See your dentist regularly. Brush and floss your teeth as directed. Before you have any dental work done, tell your dentist you are receiving this medicine.  Do   not become pregnant while taking this medicine or for 5 months after stopping it. Women should inform their doctor if they wish to become pregnant or think they might be pregnant. There is a potential for serious side effects to an unborn child. Talk to your health care professional or pharmacist for more information.  What side effects may I notice from receiving this medicine?  Side effects that you should report to your doctor or health care professional as soon as  possible:  -allergic reactions like skin rash, itching or hives, swelling of the face, lips, or tongue  -breathing problems  -chest pain  -fast, irregular heartbeat  -feeling faint or lightheaded, falls  -fever, chills, or any other sign of infection  -muscle spasms, tightening, or twitches  -numbness or tingling  -skin blisters or bumps, or is dry, peels, or red  -slow healing or unexplained pain in the mouth or jaw  -unusual bleeding or bruising  Side effects that usually do not require medical attention (Report these to your doctor or health care professional if they continue or are bothersome.):  -muscle pain  -stomach upset, gas  This list may not describe all possible side effects. Call your doctor for medical advice about side effects. You may report side effects to FDA at 1-800-FDA-1088.  Where should I keep my medicine?  This medicine is only given in a clinic, doctor's office, or other health care setting and will not be stored at home.  NOTE: This sheet is a summary. It may not cover all possible information. If you have questions about this medicine, talk to your doctor, pharmacist, or health care provider.      2016, Elsevier/Gold Standard. (2012-06-15 12:37:47)

## 2017-11-17 NOTE — Telephone Encounter (Signed)
Gave patient avs report and appointments for May. Central radiology will call re scan. Message to GM to clarify when patient is to have feraheme.

## 2017-11-24 ENCOUNTER — Other Ambulatory Visit: Payer: Self-pay | Admitting: *Deleted

## 2017-11-24 ENCOUNTER — Other Ambulatory Visit: Payer: Self-pay | Admitting: Oncology

## 2017-11-24 MED ORDER — LETROZOLE 2.5 MG PO TABS: 2.5000 mg | ORAL_TABLET | Freq: Every day | ORAL | 0 refills | Status: DC

## 2017-11-26 ENCOUNTER — Other Ambulatory Visit: Payer: Self-pay

## 2017-11-26 ENCOUNTER — Ambulatory Visit: Payer: BLUE CROSS/BLUE SHIELD

## 2017-11-26 ENCOUNTER — Encounter (HOSPITAL_COMMUNITY): Payer: Self-pay | Admitting: *Deleted

## 2017-11-26 NOTE — Progress Notes (Signed)
Spoke with pt for pre-op call. Pt denies cardiac history except for a slight murmur that has never given her any issues. Pt denies being diabetic.   EKG - 09/04/17 - in Epic CXR - 05/19/17 - in [place]

## 2017-11-26 NOTE — H&P (Signed)
General:  60/female(patient of Dr.Edwards) was recently seen as an inpatient for variceal bleeding. SHe has presented with hemetemesis and melena, Hb of 7.5(Plt 73, TB 0.5, Cr 0.9, INR 1.17 MELD of 8), underwent an EGD on 09/04/17 which showed grade 3 varices, 7 bands were placed, she also had GOV, mild portal HTNsive gastropathy, normal duodenum. SHe has had BRTO previously. Last CT is from 06/12/17 which showed 1.8 cm hepatic cyst, no clear evidence of cirrhosis,no ascites, prominent spleen, large gastric varices are no longer present, embolization coils related to gastrorenal shunt embolization, mildly prominent left gastric vein, small distal esophageal varices. Multiple punctate nodular densities at lung bases and patient may benefit from 12 month F/U due to history of breast cancer. Liver biopsy from 12/17 showed minimal portal inflammation.No significant nodules/cirrhotic architecture identified. She has been doing well since the EGD. Denies nausea or vomiting. Denies difficulty with swallowing.  SHe reports 1 Bm a day and has not noted blood in stool or black stools. Denies abdominal pain or distension, denies swelling of extremities or episodes of confusion. Denies heartburn or acid reflux.   Current Medications  Taking   Calcium Carbonate 600 MG Tablet 1 tablet Orally Twice a day   Effexor XR(Venlafaxine HCl ER) 150 MG Capsule Extended Release 24 Hour 1 capsule with food Orally Once a day, Notes: Dr. Jana Hakim   Femara(Letrozole) 2.5 MG Tablet 1 tablet Orally Once a day, Notes: Dr. Jana Hakim   Vitamin D3 Maximum Strength(Cholecalciferol) 5000 UNIT Capsule 1 capsule Orally Once a day   Vitamin E 100 UNIT Capsule 1 capsule Orally Once a day   Magnesium 500 MG Tablet 1 capsule with a meal Orally Once a day   Prolia(Denosumab) 60 MG/ML Solution Subcutaneous   Pantoprazole Sodium 40 MG Tablet Delayed Release 1 tablet Orally Once a day   Propranolol HCl 20 MG Tablet 1 tablet Orally Twice a day    Medication List reviewed and reconciled with the patient    Past Medical History  Breast CA, right - Stage 4 Dx 07/06/2010.   Lung cancer(metastatic to lung).   Deep vein thrombosis in left side of neck (think due to her prior port-a-cath).   Lymphedema.   IBD.   Heart murmur.   Blood transfusion - 2012.   Radiation 07/21/2012-09/07/2012.   Leukopenia.   Osteoporosis.   Thrombocytopenia (persistent since chemo).    Surgical History  portacath placement, left side 2011  D&C 2004?  right mastectomy 06/02/2012  portacath removal 06/2014  colonoscopy 02/28/2015  esophageal varicies - [facility] - Dr. Watt Climes. Dr. Earleen Newport 03/2016  egd 08/2017   Family History  Father: deceased 34 yrs, heart disease, diagnosed with Coronary artery disease  Mother: deceased 42 yrs, CABG, diagnosed with Coronary artery disease  Sister 1: alive  Sister 2: alive  2 sister(s) .   Negative family hx colon cancer, colon polyps, and liver disease.   Social History  General:  Tobacco use  cigarettes: Never smoked Tobacco history last updated 09/10/2017 no EXPOSURE TO PASSIVE SMOKE.  Alcohol: yes, wine, Rare.  Caffeine: yes, coffee 1 serving daily, , tea daily- unsweet.  no Recreational drug use.  no Exercise.  Marital Status: married.  Children: none.  EDUCATION: Some College.  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  COMMUNICATION BARRIERS: none.    Allergies  N.K.D.A.   Hospitalization/Major Diagnostic Procedure  Not in the past year 01/2015  [place] procedure - see above 04/17  EGD 08/2017   Review  of Systems  GI PROCEDURE:  no Pacemaker/ AICD, no. no Artificial heart valves. no MI/heart attack. no Abnormal heart rhythm. no Angina. no CVA. no Hypertension. no Hypotension. no Asthma, COPD. no Sleep apnea. no Seizure disorders. no Artificial joints. no Severe DJD. no Diabetes. Significant headaches YES. no Vertigo. Depression/anxiety YES, depression. no  Abnormal bleeding. no Kidney Disease. no Liver disease, no. no Chance of pregnancy. Blood transfusion yes, 2012. no Method of Birth Control. no Birth control pills.       Vital Signs  Wt 131.7, Wt change -4.1 lb, Ht 63.75, BMI 22.78, Temp 97.5, Pulse sitting 86, BP sitting 102/60.   Examination  Gastroenterology:: GENERAL APPEARANCE: Well developed, well nourished, no active distress, pleasant, no acute distress .  EYES: Lids and conjunctiva normal. Sclera normal, pupils equal and reactive .  ORAL CAVITY: Lips, teeth and gums are normal. Pharynx, tongue, mucosa normal .  SCLERA: anicteric .  NECK Full ROM, trachea midline, no thyromegaly or masses .  CARDIOVASCULAR PMI LS border. Normal RRR w/o murmers or gallops. No peripheral edema .  RESPIRATORY Breath sounds normal. Respiration even and unlabored .  ABDOMEN No masses palpated. Liver and spleen not palpated, normal. Bowel sounds normal, Abdomen not distended .  EXTREMITIES: No edema, pulses intact .  NEURO: normal strength and reflexes, cranial nerves II-XII grossly intact, normal gait .  PSYCH: mood/affect normal .     Assessments   1. Portal hypertension - K76.6 (Primary)   2. Esophageal varices determined by endoscopy - I85.00   Treatment  1. Portal hypertension  Start Propranolol HCl Tablet, 20 MG, 1 tablet, Orally, Twice a day, 30 day(s), 60, Refills 3 Notes: Etiology not known, liver biopsy did not show evidence of cirrhosis and prior imaging have shown a normal echotexture of liver. Non cirrhotic portal HTN ?cause(prior chemoradiation for breast cancer), will review prior workup for chronic liver disease, patient reports that she has been tested extensively in the past(remebers being tested negative for Hepatitis B anc C).    2. Esophageal varices determined by endoscopy  IMAGING: Esophagoscopy    Whitfield,Dia 09/25/2017 11:41:01 AM > spoke with Kim-scheduled for 11/19/17 at [facility] at 9:00am.   Notes: Will need EGd for  banding on a scheduled basis likely every 8 weeks until varices are completely eradicated. She is s/p BRTO for gatric varices and has an upcoming f/u imaging with her radiologist in 11/18. Advised to continue propranolol 20 mg BID, ideally HR should be around 55-6/min, however her SBP is around 100 mmHg and it will be challenging to increase further dose.  Referral To: Reason:egd w/banding w/propofol-spoke with [phone]

## 2017-11-27 ENCOUNTER — Encounter (HOSPITAL_COMMUNITY)
Admission: RE | Disposition: A | Discharge status: Home / Self Care | Payer: Self-pay | Source: Ambulatory Visit | Attending: Gastroenterology

## 2017-11-27 ENCOUNTER — Ambulatory Visit (HOSPITAL_COMMUNITY): Payer: BLUE CROSS/BLUE SHIELD | Admitting: Anesthesiology

## 2017-11-27 ENCOUNTER — Encounter (HOSPITAL_COMMUNITY): Payer: Self-pay | Admitting: *Deleted

## 2017-11-27 ENCOUNTER — Ambulatory Visit: Payer: BLUE CROSS/BLUE SHIELD

## 2017-11-27 ENCOUNTER — Telehealth: Payer: Self-pay | Admitting: Oncology

## 2017-11-27 ENCOUNTER — Other Ambulatory Visit: Payer: Self-pay | Admitting: Oncology

## 2017-11-27 ENCOUNTER — Ambulatory Visit (HOSPITAL_COMMUNITY)
Admission: RE | Admit: 2017-11-27 | Discharge: 2017-11-27 | Disposition: A | Discharge status: Home / Self Care | Payer: BLUE CROSS/BLUE SHIELD | Source: Ambulatory Visit | Attending: Gastroenterology | Admitting: Gastroenterology

## 2017-11-27 DIAGNOSIS — Z853 Personal history of malignant neoplasm of breast: Secondary | ICD-10-CM | POA: Insufficient documentation

## 2017-11-27 DIAGNOSIS — M1990 Unspecified osteoarthritis, unspecified site: Secondary | ICD-10-CM | POA: Diagnosis not present

## 2017-11-27 DIAGNOSIS — C7800 Secondary malignant neoplasm of unspecified lung: Secondary | ICD-10-CM | POA: Insufficient documentation

## 2017-11-27 DIAGNOSIS — M810 Age-related osteoporosis without current pathological fracture: Secondary | ICD-10-CM | POA: Diagnosis not present

## 2017-11-27 DIAGNOSIS — K317 Polyp of stomach and duodenum: Secondary | ICD-10-CM | POA: Diagnosis not present

## 2017-11-27 DIAGNOSIS — Z9011 Acquired absence of right breast and nipple: Secondary | ICD-10-CM | POA: Insufficient documentation

## 2017-11-27 DIAGNOSIS — Z923 Personal history of irradiation: Secondary | ICD-10-CM | POA: Insufficient documentation

## 2017-11-27 DIAGNOSIS — I8500 Esophageal varices without bleeding: Secondary | ICD-10-CM | POA: Diagnosis not present

## 2017-11-27 DIAGNOSIS — K766 Portal hypertension: Secondary | ICD-10-CM | POA: Diagnosis not present

## 2017-11-27 DIAGNOSIS — Z86718 Personal history of other venous thrombosis and embolism: Secondary | ICD-10-CM | POA: Diagnosis not present

## 2017-11-27 DIAGNOSIS — K7460 Unspecified cirrhosis of liver: Secondary | ICD-10-CM | POA: Diagnosis not present

## 2017-11-27 DIAGNOSIS — K580 Irritable bowel syndrome with diarrhea: Secondary | ICD-10-CM | POA: Diagnosis not present

## 2017-11-27 DIAGNOSIS — I8501 Esophageal varices with bleeding: Secondary | ICD-10-CM | POA: Diagnosis not present

## 2017-11-27 DIAGNOSIS — D509 Iron deficiency anemia, unspecified: Secondary | ICD-10-CM | POA: Diagnosis not present

## 2017-11-27 DIAGNOSIS — D696 Thrombocytopenia, unspecified: Secondary | ICD-10-CM | POA: Insufficient documentation

## 2017-11-27 DIAGNOSIS — K921 Melena: Secondary | ICD-10-CM | POA: Diagnosis not present

## 2017-11-27 DIAGNOSIS — R011 Cardiac murmur, unspecified: Secondary | ICD-10-CM | POA: Insufficient documentation

## 2017-11-27 DIAGNOSIS — I890 Lymphedema, not elsewhere classified: Secondary | ICD-10-CM | POA: Insufficient documentation

## 2017-11-27 HISTORY — DX: Restless legs syndrome: G25.81

## 2017-11-27 HISTORY — DX: Anemia, unspecified: D64.9

## 2017-11-27 HISTORY — DX: Unspecified osteoarthritis, unspecified site: M19.90

## 2017-11-27 HISTORY — PX: ESOPHAGOGASTRODUODENOSCOPY (EGD) WITH PROPOFOL: SHX5813

## 2017-11-27 HISTORY — PX: ESOPHAGEAL BANDING: SHX5518

## 2017-11-27 SURGERY — ESOPHAGOGASTRODUODENOSCOPY (EGD) WITH PROPOFOL
Anesthesia: Monitor Anesthesia Care

## 2017-11-27 MED ORDER — PROPOFOL 500 MG/50ML IV EMUL
INTRAVENOUS | Status: DC | PRN
  Administered 2017-11-27: 75 ug/kg/min via INTRAVENOUS

## 2017-11-27 MED ORDER — ONDANSETRON HCL 4 MG/2ML IJ SOLN
INTRAMUSCULAR | Status: DC | PRN
  Administered 2017-11-27: 4 mg via INTRAVENOUS

## 2017-11-27 MED ORDER — FENTANYL CITRATE (PF) 100 MCG/2ML IJ SOLN
INTRAMUSCULAR | Status: AC
  Filled 2017-11-27: qty 2

## 2017-11-27 MED ORDER — LIDOCAINE 2% (20 MG/ML) 5 ML SYRINGE
INTRAMUSCULAR | Status: DC | PRN
  Administered 2017-11-27: 60 mg via INTRAVENOUS

## 2017-11-27 MED ORDER — ONDANSETRON HCL 4 MG/2ML IJ SOLN
INTRAMUSCULAR | Status: AC
  Filled 2017-11-27: qty 2

## 2017-11-27 MED ORDER — BUTAMBEN-TETRACAINE-BENZOCAINE 2-2-14 % EX AERO
INHALATION_SPRAY | CUTANEOUS | Status: DC | PRN
  Administered 2017-11-27: 2 via TOPICAL

## 2017-11-27 MED ORDER — FENTANYL CITRATE (PF) 100 MCG/2ML IJ SOLN
25.0000 ug | Freq: Once | INTRAMUSCULAR | Status: AC
  Administered 2017-11-27: 25 ug via INTRAVENOUS

## 2017-11-27 MED ORDER — PROPOFOL 10 MG/ML IV BOLUS
INTRAVENOUS | Status: DC | PRN
  Administered 2017-11-27: 30 mg via INTRAVENOUS

## 2017-11-27 MED ORDER — ONDANSETRON HCL 4 MG/2ML IJ SOLN
4.0000 mg | Freq: Once | INTRAMUSCULAR | Status: AC
  Administered 2017-11-27: 4 mg via INTRAVENOUS

## 2017-11-27 MED ORDER — LACTATED RINGERS IV SOLN
INTRAVENOUS | Status: AC | PRN
  Administered 2017-11-27: 1000 mL via INTRAVENOUS
  Administered 2017-11-27: 09:00:00 via INTRAVENOUS

## 2017-11-27 NOTE — Anesthesia Preprocedure Evaluation (Signed)
Anesthesia Evaluation  Patient identified by MRN, date of birth, ID band Patient awake    Reviewed: Allergy & Precautions, H&P , NPO status , Patient's Chart, lab work & pertinent test results  Airway Mallampati: II   Neck ROM: full    Dental   Pulmonary neg pulmonary ROS,    breath sounds clear to auscultation       Cardiovascular  Rhythm:regular Rate:Normal     Neuro/Psych  Headaches, PSYCHIATRIC DISORDERS Depression    GI/Hepatic (+) Cirrhosis       , Portal hypertension Esophageal varices   Endo/Other    Renal/GU stones     Musculoskeletal  (+) Arthritis ,   Abdominal   Peds  Hematology   Anesthesia Other Findings   Reproductive/Obstetrics                             Anesthesia Physical Anesthesia Plan  ASA: III  Anesthesia Plan: MAC   Post-op Pain Management:    Induction: Intravenous  PONV Risk Score and Plan: 2 and Ondansetron, Propofol infusion and Treatment may vary due to age or medical condition  Airway Management Planned: Nasal Cannula  Additional Equipment:   Intra-op Plan:   Post-operative Plan:   Informed Consent: I have reviewed the patients History and Physical, chart, labs and discussed the procedure including the risks, benefits and alternatives for the proposed anesthesia with the patient or authorized representative who has indicated his/her understanding and acceptance.     Plan Discussed with: CRNA and Anesthesiologist  Anesthesia Plan Comments:         Anesthesia Quick Evaluation

## 2017-11-27 NOTE — Op Note (Signed)
[facility] Patient Name: Nicole [name] Procedure Date : 11/27/2017 MRN: [id number] Attending MD: [name] , MD Date of Birth: October 02, 1956 CSN: [id number] Age: 61 Admit Type: Outpatient Procedure:                Upper GI endoscopy Indications:              For therapy of esophageal varices Providers:                [name], MD, Carmie End, RN, Cherylynn Ridges,                            Technician, Rebekah Chesterfield, CRNA Referring MD:              Medicines:                Monitored Anesthesia Care Complications:            No immediate complications. Estimated Blood Loss:     Estimated blood loss: none. Procedure:                Pre-Anesthesia Assessment:                           - Prior to the procedure, a History and Physical                            was performed, and patient medications and                            allergies were reviewed. The patient's tolerance of                            previous anesthesia was also reviewed. The risks                            and benefits of the procedure and the sedation                            options and risks were discussed with the patient.                            All questions were answered, and informed consent                            was obtained. Prior Anticoagulants: The patient has                            taken no previous anticoagulant or antiplatelet                            agents. ASA Grade Assessment: II - A patient with                            mild systemic disease. After reviewing the risks  and benefits, the patient was deemed in                            satisfactory condition to undergo the procedure.                           After obtaining informed consent, the endoscope was                            passed under direct vision. Throughout the                            procedure, the patient's blood pressure, pulse, and      oxygen saturations were monitored continuously. The                            was introduced through the mouth, and advanced to                            the second part of duodenum. The upper GI endoscopy                            was accomplished without difficulty. The patient                            tolerated the procedure well. Scope In: Scope Out: Findings:      Grade III, large (> 5 mm) varices were found in the middle third of the       esophagus and in the lower third of the esophagus. They were medium in       size. Seven bands were successfully placed with complete eradication,       resulting in deflation of varices. There was no bleeding at the end of       the procedure.      Multiple small sessile polyps with no bleeding and no stigmata of recent       bleeding were found in the gastric body.      The cardia and gastric fundus were normal on retroflexion.      The examined duodenum was normal. Impression:               - Grade III and large (> 5 mm) esophageal varices.                            Completely eradicated. Banded.                           - Multiple gastric polyps.                           - Normal examined duodenum.                           - No specimens collected. Moderate Sedation:      Patient did not receive moderate sedation for this procedure, but       instead received monitored anesthesia care. Recommendation:           -  Patient has a contact number available for                            emergencies. The signs and symptoms of potential                            delayed complications were discussed with the                            patient. Return to normal activities tomorrow.                            Written discharge instructions were provided to the                            patient.                           - Clear liquid diet for 4 hours, then regular diet                            thereafter.                            - Repeat upper endoscopy in 6 weeks for endoscopic                            band ligation.                           - Continue present medications. Procedure Code(s):        --- Professional ---                           [phone], Esophagogastroduodenoscopy, flexible,                            transoral; with band ligation of esophageal/gastric                            varices Diagnosis Code(s):        --- Professional ---                           I85.00, Esophageal varices without bleeding                           K31.7, Polyp of stomach and duodenum CPT copyright 2016 American Medical Association. All rights reserved. The codes documented in this report are preliminary and upon coder review may  be revised to meet current compliance requirements. [name], MD 11/27/2017 9:37:45 AM This report has been signed electronically. Number of Addenda: 0

## 2017-11-27 NOTE — Telephone Encounter (Signed)
Left message for patient regarding upcoming December appointments per 11/26 sch message

## 2017-11-27 NOTE — Discharge Instructions (Signed)

## 2017-11-27 NOTE — Anesthesia Postprocedure Evaluation (Signed)
Anesthesia Post Note  Patient: Nicole [name]  Procedure(s) Performed: ESOPHAGOGASTRODUODENOSCOPY (EGD) W/ BANDING (N/A )     Patient location during evaluation: PACU Anesthesia Type: MAC Level of consciousness: awake and alert Pain management: pain level controlled Vital Signs Assessment: post-procedure vital signs reviewed and stable Respiratory status: spontaneous breathing, nonlabored ventilation, respiratory function stable and patient connected to nasal cannula oxygen Cardiovascular status: stable and blood pressure returned to baseline Postop Assessment: no apparent nausea or vomiting Anesthetic complications: no    Last Vitals:  Vitals:   11/27/17 0939 11/27/17 0940  BP: 140/75   Pulse: 74 71  Resp: 12 13  Temp: (!) 36.3 C   SpO2: 100% 100%    Last Pain:  Vitals:   11/27/17 0939  TempSrc: Oral  PainSc: [place]

## 2017-11-27 NOTE — Transfer of Care (Signed)
Immediate Anesthesia Transfer of Care Note  Patient: Nicole [name]  Procedure(s) Performed: ESOPHAGOGASTRODUODENOSCOPY (EGD) W/ BANDING (N/A )  Patient Location: PACU  Anesthesia Type:MAC  Level of Consciousness: awake, alert , oriented and patient cooperative  Airway & Oxygen Therapy: Patient Spontanous Breathing and Patient connected to nasal cannula oxygen  Post-op Assessment: Report given to RN, Post -op Vital signs reviewed and stable and Patient moving all extremities  Post vital signs: Reviewed and stable  Last Vitals:  Vitals:   11/27/17 0835  BP: 119/65  Pulse: 66  Resp: 20  Temp: 36.7 C  SpO2: 98%    Last Pain:  Vitals:   11/27/17 0835  TempSrc: Oral         Complications: No apparent anesthesia complications

## 2017-11-27 NOTE — Op Note (Signed)
EGD was performed for known esophageal varices for therapy with band ligation. Grade 3 varices were noted in mid and distal esophagus. 6 bands were deployed. Multiple polyps were noted in the gastric body. Retroflexion did not reveal any gastric varices. Duodenal bulb and the rest of the duodenum appeared unremarkable.  Recommend: Clear liquid diet for 4 hours, regular diet thereafter. Continue non-selected beta blocker at previous dose. Repeat endoscopy in 6 weeks for banding ligation of varices in order to achieve complete eradication.  [name], M.D.

## 2017-11-27 NOTE — Brief Op Note (Signed)
11/27/2017  9:38 AM  PATIENT:  Nicole [name]  61 y.o. female  PRE-OPERATIVE DIAGNOSIS:  Esophageal Varices determined by endoscopy  POST-OPERATIVE DIAGNOSIS:  esophageal varices  PROCEDURE:  Procedure(s): ESOPHAGOGASTRODUODENOSCOPY (EGD) W/ BANDING (N/A)  SURGEON:  Surgeon(s) and Role:    [name], MD - Primary  PHYSICIAN ASSISTANT:   ASSISTANTS: Carmie End, RN, Janie Billups, Tech   ANESTHESIA:   MAC  EBL:  None  BLOOD ADMINISTERED:none  DRAINS: none   LOCAL MEDICATIONS USED:  NONE  SPECIMEN:  No Specimen  DISPOSITION OF SPECIMEN:  N/A  COUNTS:  YES  TOURNIQUET:  * No tourniquets in log *  DICTATION: .Dragon Dictation  PLAN OF CARE: Discharge to home after PACU  PATIENT DISPOSITION:  PACU - hemodynamically stable.   Delay start of Pharmacological VTE agent (>24hrs) due to surgical blood loss or risk of bleeding: no

## 2017-11-28 ENCOUNTER — Encounter (HOSPITAL_COMMUNITY): Payer: Self-pay | Admitting: Gastroenterology

## 2017-12-02 ENCOUNTER — Ambulatory Visit (HOSPITAL_COMMUNITY)
Admission: RE | Admit: 2017-12-02 | Discharge: 2017-12-02 | Disposition: A | Discharge status: Home / Self Care | Payer: BLUE CROSS/BLUE SHIELD | Source: Ambulatory Visit | Attending: Oncology | Admitting: Oncology

## 2017-12-02 DIAGNOSIS — Z170 Estrogen receptor positive status [ER+]: Secondary | ICD-10-CM | POA: Diagnosis not present

## 2017-12-02 DIAGNOSIS — Z7189 Other specified counseling: Secondary | ICD-10-CM

## 2017-12-02 DIAGNOSIS — C7800 Secondary malignant neoplasm of unspecified lung: Secondary | ICD-10-CM | POA: Diagnosis not present

## 2017-12-02 DIAGNOSIS — D500 Iron deficiency anemia secondary to blood loss (chronic): Secondary | ICD-10-CM

## 2017-12-02 DIAGNOSIS — M810 Age-related osteoporosis without current pathological fracture: Secondary | ICD-10-CM

## 2017-12-02 DIAGNOSIS — C50411 Malignant neoplasm of upper-outer quadrant of right female breast: Secondary | ICD-10-CM | POA: Diagnosis not present

## 2017-12-02 DIAGNOSIS — Z923 Personal history of irradiation: Secondary | ICD-10-CM | POA: Diagnosis not present

## 2017-12-02 DIAGNOSIS — C50919 Malignant neoplasm of unspecified site of unspecified female breast: Secondary | ICD-10-CM

## 2017-12-02 DIAGNOSIS — Z9221 Personal history of antineoplastic chemotherapy: Secondary | ICD-10-CM | POA: Insufficient documentation

## 2017-12-02 DIAGNOSIS — R911 Solitary pulmonary nodule: Secondary | ICD-10-CM | POA: Diagnosis not present

## 2017-12-02 DIAGNOSIS — Z9011 Acquired absence of right breast and nipple: Secondary | ICD-10-CM | POA: Diagnosis not present

## 2017-12-02 MED ORDER — IOPAMIDOL (ISOVUE-300) INJECTION 61%
INTRAVENOUS | Status: AC
  Filled 2017-12-02: qty 75

## 2017-12-02 MED ORDER — IOPAMIDOL (ISOVUE-300) INJECTION 61%
75.0000 mL | Freq: Once | INTRAVENOUS | Status: AC | PRN
  Administered 2017-12-02: 75 mL via INTRAVENOUS

## 2017-12-09 ENCOUNTER — Ambulatory Visit: Payer: BLUE CROSS/BLUE SHIELD

## 2017-12-13 ENCOUNTER — Other Ambulatory Visit: Payer: Self-pay | Admitting: Hematology and Oncology

## 2017-12-13 ENCOUNTER — Ambulatory Visit (HOSPITAL_BASED_OUTPATIENT_CLINIC_OR_DEPARTMENT_OTHER): Payer: BLUE CROSS/BLUE SHIELD

## 2017-12-13 VITALS — BP 122/86 | HR 68 | Temp 98.0°F | Resp 17

## 2017-12-13 DIAGNOSIS — D509 Iron deficiency anemia, unspecified: Secondary | ICD-10-CM | POA: Diagnosis not present

## 2017-12-13 DIAGNOSIS — M810 Age-related osteoporosis without current pathological fracture: Secondary | ICD-10-CM

## 2017-12-13 MED ORDER — SODIUM CHLORIDE 0.9 % IV SOLN
510.0000 mg | Freq: Once | INTRAVENOUS | Status: AC
  Administered 2017-12-13: 510 mg via INTRAVENOUS
  Filled 2017-12-13: qty 17

## 2017-12-13 MED ORDER — SODIUM CHLORIDE 0.9 % IV SOLN
Freq: Once | INTRAVENOUS | Status: AC
  Administered 2017-12-13: 10:00:00 via INTRAVENOUS

## 2017-12-13 NOTE — Patient Instructions (Signed)

## 2017-12-16 ENCOUNTER — Ambulatory Visit: Payer: BLUE CROSS/BLUE SHIELD

## 2017-12-18 ENCOUNTER — Ambulatory Visit (HOSPITAL_BASED_OUTPATIENT_CLINIC_OR_DEPARTMENT_OTHER): Payer: BLUE CROSS/BLUE SHIELD

## 2017-12-18 VITALS — BP 127/82 | HR 65 | Temp 97.8°F | Resp 16 | Wt 136.8 lb

## 2017-12-18 DIAGNOSIS — M810 Age-related osteoporosis without current pathological fracture: Secondary | ICD-10-CM

## 2017-12-18 DIAGNOSIS — D509 Iron deficiency anemia, unspecified: Secondary | ICD-10-CM

## 2017-12-18 MED ORDER — SODIUM CHLORIDE 0.9 % IV SOLN
510.0000 mg | Freq: Once | INTRAVENOUS | Status: AC
  Administered 2017-12-18: 510 mg via INTRAVENOUS
  Filled 2017-12-18: qty 17

## 2017-12-18 NOTE — Patient Instructions (Signed)

## 2017-12-26 ENCOUNTER — Ambulatory Visit
Admission: RE | Admit: 2017-12-26 | Discharge: 2017-12-26 | Disposition: A | Discharge status: Home / Self Care | Payer: BLUE CROSS/BLUE SHIELD | Source: Ambulatory Visit | Attending: Family Medicine | Admitting: Family Medicine

## 2017-12-26 DIAGNOSIS — Z1231 Encounter for screening mammogram for malignant neoplasm of breast: Secondary | ICD-10-CM

## 2017-12-26 DIAGNOSIS — Z9011 Acquired absence of right breast and nipple: Secondary | ICD-10-CM

## 2018-02-24 ENCOUNTER — Other Ambulatory Visit: Payer: Self-pay

## 2018-02-24 DIAGNOSIS — C7800 Secondary malignant neoplasm of unspecified lung: Principal | ICD-10-CM

## 2018-02-24 DIAGNOSIS — C50919 Malignant neoplasm of unspecified site of unspecified female breast: Secondary | ICD-10-CM

## 2018-02-24 MED ORDER — LETROZOLE 2.5 MG PO TABS: 2.5000 mg | ORAL_TABLET | Freq: Every day | ORAL | 0 refills | Status: DC

## 2018-03-02 ENCOUNTER — Other Ambulatory Visit: Payer: Self-pay | Admitting: *Deleted

## 2018-03-02 DIAGNOSIS — C50411 Malignant neoplasm of upper-outer quadrant of right female breast: Secondary | ICD-10-CM

## 2018-03-02 MED ORDER — VENLAFAXINE HCL ER 150 MG PO CP24: 150.0000 mg | ORAL_CAPSULE | Freq: Every day | ORAL | 3 refills | Status: DC

## 2018-03-05 ENCOUNTER — Other Ambulatory Visit: Payer: Self-pay | Admitting: *Deleted

## 2018-03-19 ENCOUNTER — Other Ambulatory Visit: Payer: Self-pay | Admitting: Gastroenterology

## 2018-03-19 DIAGNOSIS — I8500 Esophageal varices without bleeding: Secondary | ICD-10-CM | POA: Diagnosis not present

## 2018-04-16 ENCOUNTER — Other Ambulatory Visit: Payer: Self-pay

## 2018-04-16 ENCOUNTER — Encounter (HOSPITAL_COMMUNITY): Payer: Self-pay | Admitting: *Deleted

## 2018-04-27 NOTE — H&P (Addendum)
History of Present Illness  General:  61/female with portal Hypertension without cirrhosis(liver biopsy 12/17 minimal portal inflammation), she was seen for hematemesis and melena and has had EGDs for variceal banding. On her last OV she was advised to take propranolol 20 mg BID and  Last colonoscopy from 2016 for screening showed diverticulosis otherwise normal and a repeat was recommended in 10 years. EGD from 11/27/17 showed grade 3 varices and 7 bands were placed. Repeat EGD was recommended in 6 weeks. Patient reports doing well, has regular BMs, denies black stools, blood in stool or vomiting blood. Denies acid reflux, nausea, abdominal pain, early satiety or bloating. She reports a good appetite. Denies abdominal distension/swelling or swelling of legs. Denies confusion or drowsiness.   Current Medications  Taking   Calcium Carbonate 600 MG Tablet 1 tablet Orally Twice a day   Effexor XR(Venlafaxine HCl ER) 150 MG Capsule Extended Release 24 Hour 1 capsule with food Orally Once a day, Notes: Dr. Jana Hakim   Femara(Letrozole) 2.5 MG Tablet 1 tablet Orally Once a day, Notes: Dr. Jana Hakim   Vitamin D3 Maximum Strength(Cholecalciferol) 5000 UNIT Capsule 1 capsule Orally Once a day   Vitamin E 100 UNIT Capsule 1 capsule Orally Once a day   Magnesium 500 MG Tablet 1 capsule with a meal Orally Once a day   Prolia(Denosumab) 60 MG/ML Solution Subcutaneous   Pantoprazole Sodium 40 MG Tablet Delayed Release 1 tablet Orally Once a day   Propranolol HCl 20 MG Tablet 1 tablet Orally Twice a day   Not-Taking   Propranolol HCl 20 MG Tablet 1 tablet Orally Twice a day   Medication List reviewed and reconciled with the patient    Past Medical History  Breast CA, right - Stage 4 Dx 07/06/2010.   Lung cancer(metastatic to lung).   Deep vein thrombosis in left side of neck (think due to her prior port-a-cath).   Lymphedema.   IBD.   Heart murmur.   Blood transfusion - 2012.   Radiation  07/21/2012-09/07/2012.   Leukopenia.   Osteoporosis.   Thrombocytopenia (persistent since chemo).    Surgical History  portacath placement, left side 2011  D&C 2004?  right mastectomy 06/02/2012  portacath removal 06/2014  colonoscopy 02/28/2015  esophageal varicies - [facility] - Dr. Watt Climes. Dr. Earleen Newport 03/2016  egd 08/2017   Family History  Father: deceased 18 yrs, heart disease, diagnosed with Coronary artery disease  Mother: deceased 47 yrs, CABG, Coronary artery disease  Sister 1: alive, polyps  Sister 2: alive  2 sister(s) .   Negative family hx colon cancer, and liver disease.   Social History  General:  Tobacco use  cigarettes: Never smoked Tobacco history last updated 09/10/2017 no EXPOSURE TO PASSIVE SMOKE.  Alcohol: yes, wine, Rare.  Caffeine: yes, coffee 1 serving daily, , tea daily- unsweet.  no Recreational drug use.  no Exercise.  Marital Status: married.  Children: none.  EDUCATION: Some College.  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  COMMUNICATION BARRIERS: none.    Allergies  N.K.D.A.   Hospitalization/Major Diagnostic Procedure  Not in the past year 01/2015  [place] procedure - see above 04/17  EGD 08/2017   Review of Systems  GI PROCEDURE:  no Pacemaker/ AICD, no. no Artificial heart valves. no MI/heart attack. no Abnormal heart rhythm. no Angina. no CVA. no Hypertension. no Hypotension. no Asthma, COPD. no Sleep apnea. no Seizure disorders. no Artificial joints. no Severe DJD. no Diabetes. Significant headaches YES. no  Vertigo. Depression/anxiety YES, depression. no Abnormal bleeding. no Kidney Disease. no Liver disease, no. no Chance of pregnancy. Blood transfusion yes, 2012. no Method of Birth Control. no Birth control pills.       Vital Signs  Wt 143.0, Wt change 12 lb, Ht 63.75, BMI 24.74, Temp 96.7, Pulse sitting 72, BP sitting 122/74.   Examination  Gastroenterology:: GENERAL APPEARANCE: Well  developed, well nourished, no active distress, pleasant, no acute distress .  SCLERA: anicteric.  CARDIOVASCULAR PMI LS border. Normal RRR w/o murmers or gallops. No peripheral edema.  RESPIRATORY Breath sounds normal. Respiration even and unlabored.  ABDOMEN No masses palpated. Liver and spleen not palpated, normal. Bowel sounds normal, Abdomen not distended.  EXTREMITIES: No edema, pulses intact.  NEURO: normal strength and reflexes, cranial nerves II-XII grossly intact, normal gait, no asterixis.  PSYCH: mood/affect normal.     Assessments   1. Esophageal varices determined by endoscopy - I85.00 (Primary)   Treatment  1. Esophageal varices determined by endoscopy  IMAGING: Esophagoscopy    Whitfield,Dia 03/19/2018 11:48:41 AM > spoke with Jill-scheduled for 04/28/18-[facility] at 11:30am-prep instructions reviewed with pt.   Notes: Will schedule for an EGD wtih banding in the hopsital. Continue propranolo 20 mg BID, ideally HR should be aorund 60/min, currently 72/min, will likely increase dose of propranolol after getting the EGD done. Labs have been scheduled for 5/19, advised to forward me a copy of such.  Referral To: Reason:egd w/banding-propofol-spoke with [phone]

## 2018-04-28 ENCOUNTER — Other Ambulatory Visit: Payer: Self-pay

## 2018-04-28 ENCOUNTER — Ambulatory Visit (HOSPITAL_COMMUNITY): Payer: BLUE CROSS/BLUE SHIELD | Admitting: Anesthesiology

## 2018-04-28 ENCOUNTER — Ambulatory Visit (HOSPITAL_COMMUNITY)
Admission: RE | Admit: 2018-04-28 | Discharge: 2018-04-28 | Disposition: A | Discharge status: Home / Self Care | Payer: BLUE CROSS/BLUE SHIELD | Source: Ambulatory Visit | Attending: Gastroenterology | Admitting: Gastroenterology

## 2018-04-28 ENCOUNTER — Encounter (HOSPITAL_COMMUNITY)
Admission: RE | Disposition: A | Discharge status: Home / Self Care | Payer: Self-pay | Source: Ambulatory Visit | Attending: Gastroenterology

## 2018-04-28 ENCOUNTER — Encounter (HOSPITAL_COMMUNITY): Payer: Self-pay

## 2018-04-28 DIAGNOSIS — K766 Portal hypertension: Secondary | ICD-10-CM | POA: Insufficient documentation

## 2018-04-28 DIAGNOSIS — Z853 Personal history of malignant neoplasm of breast: Secondary | ICD-10-CM | POA: Diagnosis not present

## 2018-04-28 DIAGNOSIS — Z923 Personal history of irradiation: Secondary | ICD-10-CM | POA: Diagnosis not present

## 2018-04-28 DIAGNOSIS — D649 Anemia, unspecified: Secondary | ICD-10-CM | POA: Insufficient documentation

## 2018-04-28 DIAGNOSIS — K317 Polyp of stomach and duodenum: Secondary | ICD-10-CM | POA: Insufficient documentation

## 2018-04-28 DIAGNOSIS — Z8249 Family history of ischemic heart disease and other diseases of the circulatory system: Secondary | ICD-10-CM | POA: Insufficient documentation

## 2018-04-28 DIAGNOSIS — Z85118 Personal history of other malignant neoplasm of bronchus and lung: Secondary | ICD-10-CM | POA: Diagnosis not present

## 2018-04-28 DIAGNOSIS — K259 Gastric ulcer, unspecified as acute or chronic, without hemorrhage or perforation: Secondary | ICD-10-CM | POA: Insufficient documentation

## 2018-04-28 DIAGNOSIS — I739 Peripheral vascular disease, unspecified: Secondary | ICD-10-CM | POA: Diagnosis not present

## 2018-04-28 DIAGNOSIS — F329 Major depressive disorder, single episode, unspecified: Secondary | ICD-10-CM | POA: Diagnosis not present

## 2018-04-28 DIAGNOSIS — R011 Cardiac murmur, unspecified: Secondary | ICD-10-CM | POA: Insufficient documentation

## 2018-04-28 DIAGNOSIS — D509 Iron deficiency anemia, unspecified: Secondary | ICD-10-CM | POA: Diagnosis not present

## 2018-04-28 DIAGNOSIS — M810 Age-related osteoporosis without current pathological fracture: Secondary | ICD-10-CM | POA: Insufficient documentation

## 2018-04-28 DIAGNOSIS — Z86718 Personal history of other venous thrombosis and embolism: Secondary | ICD-10-CM | POA: Diagnosis not present

## 2018-04-28 DIAGNOSIS — M1990 Unspecified osteoarthritis, unspecified site: Secondary | ICD-10-CM | POA: Insufficient documentation

## 2018-04-28 DIAGNOSIS — I8510 Secondary esophageal varices without bleeding: Secondary | ICD-10-CM | POA: Insufficient documentation

## 2018-04-28 DIAGNOSIS — R51 Headache: Secondary | ICD-10-CM | POA: Diagnosis not present

## 2018-04-28 DIAGNOSIS — D696 Thrombocytopenia, unspecified: Secondary | ICD-10-CM | POA: Insufficient documentation

## 2018-04-28 DIAGNOSIS — D72819 Decreased white blood cell count, unspecified: Secondary | ICD-10-CM | POA: Insufficient documentation

## 2018-04-28 DIAGNOSIS — Z8371 Family history of colonic polyps: Secondary | ICD-10-CM | POA: Insufficient documentation

## 2018-04-28 DIAGNOSIS — I82409 Acute embolism and thrombosis of unspecified deep veins of unspecified lower extremity: Secondary | ICD-10-CM | POA: Diagnosis not present

## 2018-04-28 DIAGNOSIS — I8500 Esophageal varices without bleeding: Secondary | ICD-10-CM | POA: Diagnosis not present

## 2018-04-28 DIAGNOSIS — Z79899 Other long term (current) drug therapy: Secondary | ICD-10-CM | POA: Diagnosis not present

## 2018-04-28 HISTORY — PX: ESOPHAGOGASTRODUODENOSCOPY: SHX5428

## 2018-04-28 HISTORY — PX: ESOPHAGEAL BANDING: SHX5518

## 2018-04-28 SURGERY — EGD (ESOPHAGOGASTRODUODENOSCOPY)
Anesthesia: Monitor Anesthesia Care

## 2018-04-28 MED ORDER — HYDROMORPHONE HCL 1 MG/ML IJ SOLN
1.0000 mg | Freq: Once | INTRAMUSCULAR | Status: AC
  Administered 2018-04-28: 1 mg via INTRAVENOUS

## 2018-04-28 MED ORDER — LIDOCAINE 2% (20 MG/ML) 5 ML SYRINGE
INTRAMUSCULAR | Status: DC | PRN
  Administered 2018-04-28: 100 mg via INTRAVENOUS

## 2018-04-28 MED ORDER — PROPOFOL 10 MG/ML IV BOLUS
INTRAVENOUS | Status: DC | PRN
  Administered 2018-04-28: 50 mg via INTRAVENOUS
  Administered 2018-04-28 (×3): 30 mg via INTRAVENOUS

## 2018-04-28 MED ORDER — HYDROMORPHONE HCL 1 MG/ML IJ SOLN
INTRAMUSCULAR | Status: AC
  Filled 2018-04-28: qty 1

## 2018-04-28 MED ORDER — SODIUM CHLORIDE 0.9 % IV SOLN: INTRAVENOUS | Status: DC

## 2018-04-28 MED ORDER — LACTATED RINGERS IV SOLN
INTRAVENOUS | Status: DC | PRN
Start: 2018-04-28 — End: 2018-04-28
  Administered 2018-04-28: 12:00:00 via INTRAVENOUS

## 2018-04-28 NOTE — Brief Op Note (Signed)
04/28/2018  12:00 PM  PATIENT:  Nicole [name]  62 y.o. female  PRE-OPERATIVE DIAGNOSIS:  Esophageal varices determined by endoscopy  POST-OPERATIVE DIAGNOSIS:  7 bands placed for esophageal varices  PROCEDURE:  Procedure(s): ESOPHAGOGASTRODUODENOSCOPY (EGD) (N/A) ESOPHAGEAL BANDING (N/A)  SURGEON:  Surgeon(s) and Role:    [name], MD - Primary  PHYSICIAN ASSISTANT:   ASSISTANTS: Zenon Mayo, RN, William Dalton, Tech   ANESTHESIA:   MAC  EBL:  Minimal   BLOOD ADMINISTERED:none  DRAINS: none   LOCAL MEDICATIONS USED:  NONE  SPECIMEN:  No Specimen  DISPOSITION OF SPECIMEN:  N/A  COUNTS:  YES  TOURNIQUET:  * No tourniquets in log *  DICTATION: .Dragon Dictation  PLAN OF CARE: Discharge to home after PACU  PATIENT DISPOSITION:  PACU - hemodynamically stable.   Delay start of Pharmacological VTE agent (>24hrs) due to surgical blood loss or risk of bleeding: no

## 2018-04-28 NOTE — Op Note (Signed)
[facility] Patient Name: Nicole [name] Procedure Date: 04/28/2018 MRN: [id number] Attending MD: [name] , MD Date of Birth: 30-May-1956 CSN: [id number] Age: 62 Admit Type: Inpatient Procedure:                Upper GI endoscopy Indications:              For therapy of esophageal varices Providers:                [name], MD, Zenon Mayo, RN, William Dalton,                            Technician Referring MD:              Medicines:                Monitored Anesthesia Care Complications:            No immediate complications. Estimated Blood Loss:     Estimated blood loss: none. Procedure:                Pre-Anesthesia Assessment:                           - Prior to the procedure, a History and Physical                            was performed, and patient medications and                            allergies were reviewed. The patient's tolerance of                            previous anesthesia was also reviewed. The risks                            and benefits of the procedure and the sedation                            options and risks were discussed with the patient.                            All questions were answered, and informed consent                            was obtained. Prior Anticoagulants: The patient has                            taken no previous anticoagulant or antiplatelet                            agents. ASA Grade Assessment: III - A patient with                            severe systemic disease. After reviewing the risks  and benefits, the patient was deemed in                            satisfactory condition to undergo the procedure.                           After obtaining informed consent, the endoscope was                            passed under direct vision. Throughout the                            procedure, the patient's blood pressure, pulse, and                            oxygen saturations  were monitored continuously. The                            [id number] ([id number]) scope was introduced through the                            mouth, and advanced to the second part of duodenum.                            The upper GI endoscopy was accomplished without                            difficulty. The patient tolerated the procedure                            well. Findings:      Grade III varices were found in the middle third of the esophagus and in       the lower third of the esophagus. They were medium in size. Seven bands       were successfully placed with complete eradication, resulting in       deflation of varices. There was no bleeding during, and at the end, of       the procedure.      A few localized, diminutive non-bleeding erosions were found in the       gastric antrum. There were stigmata of recent bleeding.      Multiple small sessile polyps with no bleeding and no stigmata of recent       bleeding were found in the gastric body.      The examined duodenum was normal.      Localized mildly erythematous mucosa without bleeding was found in the       gastric antrum. Impression:               - Grade III esophageal varices. Completely                            eradicated. Banded.                           - Non-bleeding erosive gastropathy.                           -  Multiple gastric polyps.                           - Normal examined duodenum.                           - Erythematous mucosa in the antrum.                           - No specimens collected. Moderate Sedation:      Patient did not receive moderate sedation for this procedure, but       instead received monitored anesthesia care. Recommendation:           - Patient has a contact number available for                            emergencies. The signs and symptoms of potential                            delayed complications were discussed with the                            patient. Return to  normal activities tomorrow.                            Written discharge instructions were provided to the                            patient.                           - Clear liquid diet for 4 hours.                           - Continue present medications.                           - Repeat upper endoscopy in 2 months for endoscopic                            band ligation.                           - Continue propranolol 20 mg BID. Procedure Code(s):        --- Professional ---                           [phone], Esophagogastroduodenoscopy, flexible,                            transoral; with band ligation of esophageal/gastric                            varices Diagnosis Code(s):        --- Professional ---  I85.00, Esophageal varices without bleeding                           K31.89, Other diseases of stomach and duodenum                           K31.7, Polyp of stomach and duodenum CPT copyright 2017 American Medical Association. All rights reserved. The codes documented in this report are preliminary and upon coder review may  be revised to meet current compliance requirements. [name], MD 04/28/2018 11:59:00 AM This report has been signed electronically. Number of Addenda: 0

## 2018-04-28 NOTE — Anesthesia Postprocedure Evaluation (Signed)
Anesthesia Post Note  Patient: Nicole [name]  Procedure(s) Performed: ESOPHAGOGASTRODUODENOSCOPY (EGD) (N/A ) ESOPHAGEAL BANDING (N/A )     Patient location during evaluation: PACU Anesthesia Type: MAC Level of consciousness: awake and alert Pain management: pain level controlled Vital Signs Assessment: post-procedure vital signs reviewed and stable Respiratory status: spontaneous breathing, nonlabored ventilation and respiratory function stable Cardiovascular status: stable and blood pressure returned to baseline Postop Assessment: no apparent nausea or vomiting Anesthetic complications: no    Last Vitals:  Vitals:   04/28/18 1230 04/28/18 1240  BP: (!) 153/90 132/85  Pulse: (!) 59 65  Resp: 11 15  Temp:    SpO2: 100% 100%    Last Pain:  Vitals:   04/28/18 1227  TempSrc:   PainSc: 7                  Aarin Bluett,W. EDMOND

## 2018-04-28 NOTE — Anesthesia Preprocedure Evaluation (Addendum)
Anesthesia Evaluation  Patient identified by MRN, date of birth, ID band Patient awake    Reviewed: Allergy & Precautions, H&P , NPO status , Patient's Chart, lab work & pertinent test results  Airway Mallampati: I  TM Distance: >3 FB Neck ROM: Full    Dental no notable dental hx. (+) Teeth Intact, Dental Advisory Given   Pulmonary neg pulmonary ROS,    Pulmonary exam normal breath sounds clear to auscultation       Cardiovascular + Peripheral Vascular Disease   Rhythm:Regular Rate:Normal     Neuro/Psych  Headaches, Depression negative psych ROS   GI/Hepatic negative GI ROS, (+) Cirrhosis   Esophageal Varices    ,   Endo/Other  negative endocrine ROS  Renal/GU negative Renal ROS  negative genitourinary   Musculoskeletal  (+) Arthritis , Osteoarthritis,    Abdominal   Peds  Hematology negative hematology ROS (+) anemia ,   Anesthesia Other Findings   Reproductive/Obstetrics negative OB ROS                            Anesthesia Physical Anesthesia Plan  ASA: III  Anesthesia Plan: MAC   Post-op Pain Management:    Induction: Intravenous  PONV Risk Score and Plan: 2 and Propofol infusion and Treatment may vary due to age or medical condition  Airway Management Planned: Nasal Cannula  Additional Equipment:   Intra-op Plan:   Post-operative Plan:   Informed Consent: I have reviewed the patients History and Physical, chart, labs and discussed the procedure including the risks, benefits and alternatives for the proposed anesthesia with the patient or authorized representative who has indicated his/her understanding and acceptance.   Dental advisory given  Plan Discussed with: CRNA  Anesthesia Plan Comments:         Anesthesia Quick Evaluation

## 2018-04-28 NOTE — Transfer of Care (Signed)
Immediate Anesthesia Transfer of Care Note  Patient: Nicole [name]  Procedure(s) Performed: ESOPHAGOGASTRODUODENOSCOPY (EGD) (N/A ) ESOPHAGEAL BANDING (N/A )  Patient Location: Endoscopy Unit  Anesthesia Type:MAC  Level of Consciousness: sedated and drowsy  Airway & Oxygen Therapy: Patient Spontanous Breathing and Patient connected to face mask oxygen  Post-op Assessment: Report given to RN, Post -op Vital signs reviewed and stable and Patient moving all extremities  Post vital signs: Reviewed and stable  Last Vitals:  Vitals Value Taken Time  BP    Temp    Pulse    Resp    SpO2      Last Pain:  Vitals:   04/28/18 1035  TempSrc: Oral  PainSc: 0-No pain         Complications: No apparent anesthesia complications

## 2018-04-28 NOTE — Discharge Instructions (Signed)
Clears for four hours then regular diet after that (see attachment for information about clear liquid diet).   YOU HAD AN ENDOSCOPIC PROCEDURE TODAY: Refer to the procedure report and other information in the discharge instructions given to you for any specific questions about what was found during the examination. If this information does not answer your questions, please call [facility] office at [phone] to clarify.   YOU SHOULD EXPECT: Some feelings of bloating in the abdomen. Passage of more gas than usual. Walking can help get rid of the air that was put into your GI tract during the procedure and reduce the bloating. If you had a lower endoscopy (such as a colonoscopy or flexible sigmoidoscopy) you may notice spotting of blood in your stool or on the toilet paper. Some abdominal soreness may be present for a day or two, also.  DIET: Your first meal following the procedure should be a light meal and then it is ok to progress to your normal diet. A half-sandwich or bowl of soup is an example of a good first meal. Heavy or fried foods are harder to digest and may make you feel nauseous or bloated. Drink plenty of fluids but you should avoid alcoholic beverages for 24 hours. If you had a esophageal dilation, please see attached instructions for diet.   ACTIVITY: Your care partner should take you home directly after the procedure. You should plan to take it easy, moving slowly for the rest of the day. You can resume normal activity the day after the procedure however YOU SHOULD NOT DRIVE, use power tools, machinery or perform tasks that involve climbing or major physical exertion for 24 hours (because of the sedation medicines used during the test).   SYMPTOMS TO REPORT IMMEDIATELY: A gastroenterologist can be reached at any hour. Please call [phone]  for any of the following symptoms:  Following lower endoscopy (colonoscopy, flexible sigmoidoscopy) Excessive amounts of blood in the stool    Significant tenderness, worsening of abdominal pains  Swelling of the abdomen that is new, acute  Fever of 100 or higher  Following upper endoscopy (EGD, EUS, ERCP, esophageal dilation) Vomiting of blood or coffee ground material  New, significant abdominal pain  New, significant chest pain or pain under the shoulder blades  Painful or persistently difficult swallowing  New shortness of breath  Black, tarry-looking or red, bloody stools  FOLLOW UP:  If any biopsies were taken you will be contacted by phone or by letter within the next 1-3 weeks. Call [phone]  if you have not heard about the biopsies in 3 weeks.  Please also call with any specific questions about appointments or follow up tests.

## 2018-04-28 NOTE — Op Note (Signed)
EGD was performed for therapy of esophageal varices.  Findings: Grade 3 varices found in middle and lower third of esophagus starting from 25 cm from insertion to 35 cm at GE junction. 7 bands placed with complete eradication and deflation of varices. Few localized diminutive non bleeding erosions found in gastric antrum. Multiple small sessile polyps with no bleeding found in the gastric body. Mild erythematous antrum. Normal-appearing duodenum and duodenal bulb.  Recommendations: Clear liquid diet for 4 hours, soft diet for dinner and then advance as tolerated. Continue propanolol 20 mg twice a day. Repeat endoscopy in 2 months for further endoscopic band ligation.  [name], M.D.

## 2018-05-19 ENCOUNTER — Other Ambulatory Visit: Payer: Self-pay | Admitting: Oncology

## 2018-05-19 DIAGNOSIS — C50919 Malignant neoplasm of unspecified site of unspecified female breast: Secondary | ICD-10-CM

## 2018-05-19 DIAGNOSIS — C7800 Secondary malignant neoplasm of unspecified lung: Principal | ICD-10-CM

## 2018-05-19 NOTE — Progress Notes (Signed)
ID: Nicole [name]   DOB: 1956/12/27  MR#: [id number]  JHE#:[id number]  PCP:  GYN:  SURolm Bookbinder, MD OTHER MD: Gery Pray, MD  CHIEF COMPLAINT:  Metastatic Breast Cancer  CURRENT TREATMENT: Letrozole, denosumab/Prolia   INTERVAL HISTORY: Nicole [name] returns today for follow-up and treatment of her estrogen receptor positive stage IV breast cancer accompanied by her husband. She continues on letrozole, with good tolerance. She has occasional hot flashes. She denies vaginal dryness. She obtains a 3 month supply for $20.   She also receives denosumab/ Prolia every 6 months, most recent dose in November 2018, with a dose due today.  She also tolerates this well. She denies issues with bony aches.  Since her last visit she underwent esophagogastroduodenoscopy on 04/28/2018 finding grade 3 varices in the middle third of the esophagus and the lower third of the esophagus.  There were medium in size.  7 bands were successfully placed.  The varices were completely deflated.  There was no bleeding.  In addition there were a few nonbleeding very small erosions in the gastric antrum.  There was however stigmata of recent bleeding.  There were multiple gastric polyps but a normal duodenum.  Because of the bleeding and drop in her MCV she received Feraheme 12/13/2017 and 12/18/2017.  She tolerated that with no unusual complications    REVIEW OF SYSTEMS:  Jamani reports that she enjoys watching tv and reading. She has not been exercising regularly. She used to walk alone in the mornings, but she would rather her husband walk with her. She fell out of the habit of walking. She has a treadmill and exercise equipment at home, and she plans on using it more often. She tries to eat fruit everyday. She doesn't eat a lot of red meat anymore. She loves asparagus and other vegetables. She also drinks lemon water almost every morning. She denies unusual headaches, visual changes, nausea, vomiting, or dizziness. There  has been no unusual cough, phlegm production, or pleurisy. This been no change in bowel or bladder habits. She denies unexplained fatigue or unexplained weight loss, bleeding, rash, or fever. A detailed review of systems was otherwise stable.    BREAST CANCER HISTORY: From Dr. Collier Salina Rubin's 07/25/2010 note:  "This woman has not had any significant medical intervention for some time.  Her last mammogram was about seven years ago.  She noted a right breast mass about a year ago and did not seek immediate medical attention for this.  She ultimately, after some time, admitted this to her husband and self-referred herself for intervention.  She had a mammogram on 07/17/2010 with an ultrasound of the right breast.  This showed a lobulated mass upper right outer quadrant measuring at least 6.3 x 7.3 cm and large right axillary lymph nodes were also noted.  There was skin thickening overlying the mass.  On physical exam, this mass was about an 8 cm with skin dimpling noted. Discolored area in the skin over the mass, fullness in the axilla.  Biopsy was recommended, which took place on 07/17/2010.  Pathology showed invasive ductal cancer involving both lymph node and breast.  The HER-2 was not amplified.  ER and PR both positive at 100% and 6% respectively.  Proliferative index was 12% involving both the lymph node and breast mass.  An MRI scan of both breasts was performed on 07/22/2010, essentially which showed a large heterogeneously enhancing mass of the right breast with washout kinetics measuring 6.4 x 4.4 x 5.0 cm.  Numerous satellite nodules were seen throughout the dominant mass.  There were several suspicious nodules in the upper central and upper inner quadrant of the right breast.  There were two enhancing subcentimeter nodules seen in the right pectoralis muscle.  No obvious chest wall nodules were seen; however, there was seen some metastatic adenopathy in the mediastinum as well as hila with a 2.4 x 2.0  cm mediastinal lymph node as well as 1.2 x 3.0 cm subcarinal lymph node.  There was a right hilar and probable left hilar adenopathy.  Bilateral pulmonary nodules were seen and a right T2 lesion was seen anterior right liver as well."  Her subsequent history is as detailed below.  PAST MEDICAL HISTORY: Past Medical History:  Diagnosis Date  . Blood transfusion 2012  . Breast CA ([place])    breast ca dx 06/2010, stage 4, right , Lung Metasis  . Clot    jugular  ?2015  . Depression   . Esophageal varices ([place])   . Headache    Mirgraine- rare since menopause  . Heart murmur    mild, no cardiologist  . History of kidney stones    no pain- shows in CT- small 1 mm bilaterally- kidney  . IBD (inflammatory bowel disease)    resolved  . Peripheral vascular disease ([place])   . Radiation 07/21/12-09/07/12   5940 cGy    PAST SURGICAL HISTORY: Past Surgical History:  Procedure Laterality Date  . COLONOSCOPY    . DILATION AND CURETTAGE OF UTERUS  2004?  . ESOPHAGOGASTRODUODENOSCOPY (EGD) WITH PROPOFOL N/A 04/11/2016   Procedure: ESOPHAGOGASTRODUODENOSCOPY (EGD) WITH PROPOFOL;  Surgeon: Clarene Essex, MD;  Location: [facility];  Service: Endoscopy;  Laterality: N/A;  . ESOPHAGOGASTRODUODENOSCOPY (EGD) WITH PROPOFOL N/A 07/08/2016   Procedure: ESOPHAGOGASTRODUODENOSCOPY (EGD) WITH PROPOFOL;  Surgeon: Laurence Spates, MD;  Location: [facility];  Service: Endoscopy;  Laterality: N/A;  . IR GENERIC HISTORICAL  05/15/2016   IR RADIOLOGIST EVAL & MGMT 05/15/2016 Corrie Mckusick, DO [facility] INTERV RAD  . MASTECTOMY  06/02/12   right breast  . PORT-A-CATH REMOVAL    . PORTACATH PLACEMENT  2011   left side  . RADIOLOGY WITH ANESTHESIA N/A 04/13/2016   Procedure: RADIOLOGY WITH ANESTHESIA;  Surgeon: Corrie Mckusick, DO;  Location: [facility];  Service: Anesthesiology;  Laterality: N/A;    FAMILY HISTORY Family History  Problem Relation Age of Onset  . Cancer Neg Hx   . COPD Mother   . AAA (abdominal aortic aneurysm)  Mother   . COPD Father    patient's father died at the age of 19 from heart disease. Patient's mother died at the age of 36 status post CABG. The patient had no brothers, 2 sisters. There is no history of breast or ovarian cancer in the family.  GYNECOLOGIC HISTORY: Updated May 2018 Menarche age 67, she is GX P0. She does not recall when she went through menopause. She never took hormone replacement.  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 She retired in November 2017 Husband Merry Proud is retired from [employer]. They have no children and no pets. They attend a local [place].   ADVANCED DIRECTIVES: Not in place  HEALTH MAINTENANCE: (updated 03/14/2014)  Social History  Substance Use Topics  . Smoking status: Never Smoker  . Smokeless tobacco: Never Used  . Alcohol use No     Colonoscopy: Never  PAP: Remote  Bone density: 09/27/2013,  osteoporosis with a T score of -2.8  Lipid panel: Not on file   No Known Allergies  Current Outpatient Medications:  .  Calcium Carb-Cholecalciferol (CALCIUM 600+D3) 600-800 MG-UNIT TABS, Take 1 tablet by mouth at bedtime., Disp: , Rfl:  .  Cholecalciferol (VITAMIN D3) 5000 units CAPS, Take 5,000 Units by mouth daily., Disp: , Rfl:  .  denosumab (PROLIA) 60 MG/ML SOLN injection, Inject 60 mg into the skin every 6 (six) months. Administer in upper arm, thigh, or abdomen, Disp: , Rfl:  .  letrozole (FEMARA) 2.5 MG tablet, TAKE 1 TABLET (2.5 MG TOTAL) BY MOUTH AT BEDTIME., Disp: 90 tablet, Rfl: 0 .  Magnesium 500 MG TABS, Take 500 mg by mouth at bedtime. , Disp: , Rfl:  .  propranolol (INDERAL) 20 MG tablet, Take 1 tablet (20 mg total) by mouth 2 (two) times daily., Disp: 60 tablet, Rfl: 0 .  venlafaxine XR (EFFEXOR-XR) 150 MG 24 hr capsule, TAKE 1 CAPSULE (150 MG TOTAL) BY MOUTH DAILY WITH BREAKFAST., Disp: 30 capsule, Rfl: 0 .  vitamin E 400 UNIT capsule, Take 400 Units by  mouth daily., Disp: , Rfl:  No current facility-administered medications for this visit.   Facility-Administered Medications Ordered in Other Visits:  .  denosumab (PROLIA) injection 60 mg, 60 mg, Subcutaneous, Once, Magrinat, Virgie Dad, MD No current facility-administered medications for this visit.     OBJECTIVE: Middle-aged white woman who appears well  Vitals:   05/29/18 1501  BP: 126/81  Pulse: 70  Resp: 18  Temp: 97.9 F (36.6 C)  TempSrc: Oral  SpO2: 99%  Weight: 139 lb 6.4 oz (63.2 kg)  Height: _0  (1.626 m)   Sclerae unicteric, pupils round and equal Oropharynx clear and moist No cervical or supraclavicular adenopathy Lungs no rales or rhonchi Heart regular rate and rhythm Abd soft, nontender, positive bowel sounds MSK no focal spinal tenderness, no upper extremity lymphedema Neuro: nonfocal, well oriented, appropriate affect Breasts: The right breast is status post mastectomy and radiation.  There are telangiectasias over the radiation port area, secondary to the treatment.  There is no evidence of local recurrence.  The left breast is unremarkable.  Both axillae are benign.  LAB RESULTS: Lab Results  Component Value Date   WBC 3.8 05/09/2016   NEUTROABS 5.0 05/02/2016   HGB 10.9 (L) 05/09/2016   HCT 34.8 (L) 05/09/2016   MCV 92.1 05/09/2016   PLT 98 (L) 05/09/2016      Chemistry    CMP Latest Ref Rng & Units 05/21/2018 11/17/2017 09/07/2017  Glucose 70 - 140 mg/dL 79 86 107(H)  BUN 7 - 26 mg/dL 16 12.5 10  Creatinine 0.60 - 1.10 mg/dL 1.11(H) 1.1 0.90  Sodium 136 - 145 mmol/L 140 142 141  Potassium 3.5 - 5.1 mmol/L 4.7 4.4 4.0  Chloride 98 - 109 mmol/L 110(H) - 114(H)  CO2 22 - 29 mmol/L 23 22 21(L)  Calcium 8.4 - 10.4 mg/dL 9.0 9.2 7.6(L)  Total Protein 6.4 - 8.3 g/dL 7.4 7.0 -  Total Bilirubin 0.2 - 1.2 mg/dL 0.7 0.49 -  Alkaline Phos 40 - 150 U/L 73 55 -  AST 5 - 34 U/L 25 23 -  ALT 0 - 55 U/L 25 22 -     STUDIES: Left screening mammography  12/26/2017 found the breast density to be category C.  There was no evidence of malignancy.  ASSESSMENT: 62 y.o. [place] woman presenting June 2011 with stage IV breast cancer involving the right  breast upper outer quadrant, right axilla, mediastinal lymph nodes, and both lungs, but not the brain, liver, or bones  (1) positive right breast and right axillary lymph node biopsies 07/17/2010 of a clinical T4 N2 M1 invasive ductal carcinoma, grade 1, estrogen receptor 100% positive, progesterone receptor 6% positive, with an MIB-1 of 12%, and no HER-2 amplification.  (2) participated in Phase II tessetaxel study, receiving 2 cycles complicated by thrombocytopenia, anemia requiring transfusion, transaminase elevation, and afebrile neutropenia. Off-study as of September 2011. Chest CT scan September 2011 did show evidence of response.  (3) on letrozole as of October 2011, with continuing response and good tolerance   (4) 06/02/2012 underwent right mastectomy and axillary lymph node sampling (3 lymph nodes removed, all with viable cancer as well as evidence of treatment effect) for a ypT2 ypN1-2 invasive ductal carcinoma, grade 1, 98% estrogen receptor positive, progesterone receptor negative, with no HER-2 amplification  (5) left jugular vein DVT documented 06/16/2012; left sided Port-A-Cath removed mid July 2015; Coumadin stopped 09/07/2014.  (a) Left brachiocephalic v. slightly narrowed, with some Left lower neck collateralization noted on chest CT December 2015  (6) osteoporosis, was on alendronate, switched to denosumab/Prolia 11/07/2016  (7) portal hypertension, with splenomegaly, gastric varices, and intestinal edema  (a)  mild persistent leukopenia and thrombocytopenia likely due to splenomegaly  (b) iron deficiency secondary to above, status post Feraheme x2 December 2018  (8) restaging studies:  (a) chest CT 12/26/2017 shows a 4.5 mm left lower lobe pulmonary nodule which is unchanged.   There was no other evidence of active disease   PLAN:  Ellanore is now 8 years out from her initial presentation of breast cancer with lung involvement.  She has been on letrozole as her only treatment since October 2011.  There continues to be no evidence of active disease.  She also receives denosumab/Prolia every 6 months.  This also is associated with a reduced risk of recurrence.  The reason she receives it of course is a history of osteoporosis and she will be due for repeat bone density scan later this year or early next year.  I strongly encouraged her to start an exercise program.  She does have the equipment at home.  Is just a matter of using it.  Overall however she looks terrific.  This is more of a long-term concern both for her and her husband  She will return to see me in approximately 6 months, and before that visit we will repeat a CT scan of the chest  She knows to call for any other issues that may develop before then.   Magrinat, Virgie Dad, MD  05/29/18 3:22 PM Medical Oncology and Hematology [facility] [address] [place], [place] [address] Tel. [phone]    Fax. [phone]  Alice Rieger, am acting as scribe for Chauncey Cruel MD.  I, Lurline Del MD, have reviewed the above documentation for accuracy and completeness, and I agree with the above.

## 2018-05-21 ENCOUNTER — Inpatient Hospital Stay: Payer: BLUE CROSS/BLUE SHIELD | Attending: Oncology

## 2018-05-21 DIAGNOSIS — Z86718 Personal history of other venous thrombosis and embolism: Secondary | ICD-10-CM | POA: Insufficient documentation

## 2018-05-21 DIAGNOSIS — Z79899 Other long term (current) drug therapy: Secondary | ICD-10-CM | POA: Insufficient documentation

## 2018-05-21 DIAGNOSIS — C7801 Secondary malignant neoplasm of right lung: Secondary | ICD-10-CM | POA: Insufficient documentation

## 2018-05-21 DIAGNOSIS — C771 Secondary and unspecified malignant neoplasm of intrathoracic lymph nodes: Secondary | ICD-10-CM | POA: Insufficient documentation

## 2018-05-21 DIAGNOSIS — K317 Polyp of stomach and duodenum: Secondary | ICD-10-CM | POA: Insufficient documentation

## 2018-05-21 DIAGNOSIS — C50411 Malignant neoplasm of upper-outer quadrant of right female breast: Secondary | ICD-10-CM | POA: Diagnosis not present

## 2018-05-21 DIAGNOSIS — Z79811 Long term (current) use of aromatase inhibitors: Secondary | ICD-10-CM | POA: Diagnosis not present

## 2018-05-21 DIAGNOSIS — D696 Thrombocytopenia, unspecified: Secondary | ICD-10-CM | POA: Diagnosis not present

## 2018-05-21 DIAGNOSIS — M810 Age-related osteoporosis without current pathological fracture: Secondary | ICD-10-CM

## 2018-05-21 DIAGNOSIS — I739 Peripheral vascular disease, unspecified: Secondary | ICD-10-CM | POA: Diagnosis not present

## 2018-05-21 DIAGNOSIS — Z9011 Acquired absence of right breast and nipple: Secondary | ICD-10-CM | POA: Diagnosis not present

## 2018-05-21 DIAGNOSIS — R161 Splenomegaly, not elsewhere classified: Secondary | ICD-10-CM | POA: Insufficient documentation

## 2018-05-21 DIAGNOSIS — D500 Iron deficiency anemia secondary to blood loss (chronic): Secondary | ICD-10-CM

## 2018-05-21 DIAGNOSIS — I864 Gastric varices: Secondary | ICD-10-CM | POA: Insufficient documentation

## 2018-05-21 DIAGNOSIS — Z170 Estrogen receptor positive status [ER+]: Secondary | ICD-10-CM

## 2018-05-21 DIAGNOSIS — C50919 Malignant neoplasm of unspecified site of unspecified female breast: Secondary | ICD-10-CM

## 2018-05-21 DIAGNOSIS — R232 Flushing: Secondary | ICD-10-CM | POA: Diagnosis not present

## 2018-05-21 DIAGNOSIS — C7802 Secondary malignant neoplasm of left lung: Secondary | ICD-10-CM | POA: Insufficient documentation

## 2018-05-21 DIAGNOSIS — K766 Portal hypertension: Secondary | ICD-10-CM | POA: Insufficient documentation

## 2018-05-21 DIAGNOSIS — Z7189 Other specified counseling: Secondary | ICD-10-CM

## 2018-05-21 DIAGNOSIS — Z9221 Personal history of antineoplastic chemotherapy: Secondary | ICD-10-CM | POA: Diagnosis not present

## 2018-05-21 DIAGNOSIS — C7800 Secondary malignant neoplasm of unspecified lung: Secondary | ICD-10-CM

## 2018-05-21 LAB — CBC WITH DIFFERENTIAL/PLATELET
Basophils Absolute: 0 10*3/uL (ref 0.0–0.1)
Basophils Relative: 1 %
Eosinophils Absolute: 0.1 10*3/uL (ref 0.0–0.5)
Eosinophils Relative: 1 %
HEMATOCRIT: 45.3 % (ref 34.8–46.6)
HEMOGLOBIN: 15.2 g/dL (ref 11.6–15.9)
LYMPHS ABS: 0.9 10*3/uL (ref 0.9–3.3)
LYMPHS PCT: 16 %
MCH: 30.6 pg (ref 25.1–34.0)
MCHC: 33.6 g/dL (ref 31.5–36.0)
MCV: 91 fL (ref 79.5–101.0)
Monocytes Absolute: 0.4 10*3/uL (ref 0.1–0.9)
Monocytes Relative: 8 %
NEUTROS ABS: 4.1 10*3/uL (ref 1.5–6.5)
NEUTROS PCT: 74 %
Platelets: 114 10*3/uL — ABNORMAL LOW (ref 145–400)
RBC: 4.98 MIL/uL (ref 3.70–5.45)
RDW: 14 % (ref 11.2–14.5)
WBC: 5.5 10*3/uL (ref 3.9–10.3)

## 2018-05-21 LAB — COMPREHENSIVE METABOLIC PANEL
ALT: 25 U/L (ref 0–55)
ANION GAP: 7 (ref 3–11)
AST: 25 U/L (ref 5–34)
Albumin: 4.1 g/dL (ref 3.5–5.0)
Alkaline Phosphatase: 73 U/L (ref 40–150)
BUN: 16 mg/dL (ref 7–26)
CHLORIDE: 110 mmol/L — AB (ref 98–109)
CO2: 23 mmol/L (ref 22–29)
Calcium: 9 mg/dL (ref 8.4–10.4)
Creatinine, Ser: 1.11 mg/dL — ABNORMAL HIGH (ref 0.60–1.10)
GFR calc non Af Amer: 52 mL/min — ABNORMAL LOW (ref >60)
GFR, EST AFRICAN AMERICAN: 60 mL/min — AB (ref >60)
Glucose, Bld: 79 mg/dL (ref 70–140)
POTASSIUM: 4.7 mmol/L (ref 3.5–5.1)
SODIUM: 140 mmol/L (ref 136–145)
Total Bilirubin: 0.7 mg/dL (ref 0.2–1.2)
Total Protein: 7.4 g/dL (ref 6.4–8.3)

## 2018-05-22 ENCOUNTER — Telehealth: Payer: Self-pay | Admitting: Oncology

## 2018-05-22 LAB — CANCER ANTIGEN 27.29: CA 27.29: 24.4 U/mL (ref 0.0–38.6)

## 2018-05-22 NOTE — Telephone Encounter (Signed)
GM PAL 5/30. Per GM moved appointments to 5/31. Spoke with patient she is aware.

## 2018-05-26 ENCOUNTER — Other Ambulatory Visit: Payer: Self-pay | Admitting: Oncology

## 2018-05-26 DIAGNOSIS — C50411 Malignant neoplasm of upper-outer quadrant of right female breast: Secondary | ICD-10-CM

## 2018-05-28 ENCOUNTER — Ambulatory Visit: Payer: BLUE CROSS/BLUE SHIELD | Admitting: Oncology

## 2018-05-28 ENCOUNTER — Ambulatory Visit: Payer: BLUE CROSS/BLUE SHIELD

## 2018-05-29 ENCOUNTER — Inpatient Hospital Stay: Payer: BLUE CROSS/BLUE SHIELD

## 2018-05-29 ENCOUNTER — Inpatient Hospital Stay (HOSPITAL_BASED_OUTPATIENT_CLINIC_OR_DEPARTMENT_OTHER): Payer: BLUE CROSS/BLUE SHIELD | Admitting: Oncology

## 2018-05-29 ENCOUNTER — Telehealth: Payer: Self-pay | Admitting: Oncology

## 2018-05-29 VITALS — BP 126/81 | HR 70 | Temp 97.9°F | Resp 18 | Ht 64.0 in | Wt 139.4 lb

## 2018-05-29 DIAGNOSIS — Z86718 Personal history of other venous thrombosis and embolism: Secondary | ICD-10-CM

## 2018-05-29 DIAGNOSIS — M810 Age-related osteoporosis without current pathological fracture: Secondary | ICD-10-CM

## 2018-05-29 DIAGNOSIS — Z9011 Acquired absence of right breast and nipple: Secondary | ICD-10-CM

## 2018-05-29 DIAGNOSIS — I8511 Secondary esophageal varices with bleeding: Secondary | ICD-10-CM

## 2018-05-29 DIAGNOSIS — M818 Other osteoporosis without current pathological fracture: Secondary | ICD-10-CM

## 2018-05-29 DIAGNOSIS — Z79811 Long term (current) use of aromatase inhibitors: Secondary | ICD-10-CM | POA: Diagnosis not present

## 2018-05-29 DIAGNOSIS — R161 Splenomegaly, not elsewhere classified: Secondary | ICD-10-CM | POA: Diagnosis not present

## 2018-05-29 DIAGNOSIS — Z9221 Personal history of antineoplastic chemotherapy: Secondary | ICD-10-CM | POA: Diagnosis not present

## 2018-05-29 DIAGNOSIS — C50911 Malignant neoplasm of unspecified site of right female breast: Secondary | ICD-10-CM

## 2018-05-29 DIAGNOSIS — D696 Thrombocytopenia, unspecified: Secondary | ICD-10-CM | POA: Diagnosis not present

## 2018-05-29 DIAGNOSIS — C771 Secondary and unspecified malignant neoplasm of intrathoracic lymph nodes: Secondary | ICD-10-CM

## 2018-05-29 DIAGNOSIS — C7800 Secondary malignant neoplasm of unspecified lung: Secondary | ICD-10-CM

## 2018-05-29 DIAGNOSIS — K766 Portal hypertension: Secondary | ICD-10-CM

## 2018-05-29 DIAGNOSIS — Z170 Estrogen receptor positive status [ER+]: Secondary | ICD-10-CM

## 2018-05-29 DIAGNOSIS — R232 Flushing: Secondary | ICD-10-CM | POA: Diagnosis not present

## 2018-05-29 DIAGNOSIS — I864 Gastric varices: Secondary | ICD-10-CM | POA: Diagnosis not present

## 2018-05-29 DIAGNOSIS — I739 Peripheral vascular disease, unspecified: Secondary | ICD-10-CM | POA: Diagnosis not present

## 2018-05-29 DIAGNOSIS — K317 Polyp of stomach and duodenum: Secondary | ICD-10-CM

## 2018-05-29 DIAGNOSIS — C7801 Secondary malignant neoplasm of right lung: Secondary | ICD-10-CM

## 2018-05-29 DIAGNOSIS — C50411 Malignant neoplasm of upper-outer quadrant of right female breast: Secondary | ICD-10-CM

## 2018-05-29 DIAGNOSIS — Z79899 Other long term (current) drug therapy: Secondary | ICD-10-CM

## 2018-05-29 DIAGNOSIS — K7460 Unspecified cirrhosis of liver: Secondary | ICD-10-CM

## 2018-05-29 DIAGNOSIS — C7802 Secondary malignant neoplasm of left lung: Secondary | ICD-10-CM | POA: Diagnosis not present

## 2018-05-29 MED ORDER — DENOSUMAB 60 MG/ML ~~LOC~~ SOLN
60.0000 mg | Freq: Once | SUBCUTANEOUS | Status: AC
  Administered 2018-05-29: 60 mg via SUBCUTANEOUS

## 2018-05-29 MED ORDER — VENLAFAXINE HCL ER 150 MG PO CP24: 150.0000 mg | ORAL_CAPSULE | Freq: Every day | ORAL | 4 refills | Status: DC

## 2018-05-29 NOTE — Patient Instructions (Signed)
Denosumab injection  What is this medicine?  DENOSUMAB (den oh sue mab) slows bone breakdown. Prolia is used to treat osteoporosis in women after menopause and in men. Xgeva is used to prevent bone fractures and other bone problems caused by cancer bone metastases. Xgeva is also used to treat giant cell tumor of the bone.  This medicine may be used for other purposes; ask your health care provider or pharmacist if you have questions.  What should I tell my health care provider before I take this medicine?  They need to know if you have any of these conditions:  -dental disease  -eczema  -infection or history of infections  -kidney disease or on dialysis  -low blood calcium or vitamin D  -malabsorption syndrome  -scheduled to have surgery or tooth extraction  -taking medicine that contains denosumab  -thyroid or parathyroid disease  -an unusual reaction to denosumab, other medicines, foods, dyes, or preservatives  -pregnant or trying to get pregnant  -breast-feeding  How should I use this medicine?  This medicine is for injection under the skin. It is given by a health care professional in a hospital or clinic setting.  If you are getting Prolia, a special MedGuide will be given to you by the pharmacist with each prescription and refill. Be sure to read this information carefully each time.  For Prolia, talk to your pediatrician regarding the use of this medicine in children. Special care may be needed. For Xgeva, talk to your pediatrician regarding the use of this medicine in children. While this drug may be prescribed for children as young as 13 years for selected conditions, precautions do apply.  Overdosage: If you think you have taken too much of this medicine contact a poison control center or emergency room at once.  NOTE: This medicine is only for you. Do not share this medicine with others.  What if I miss a dose?  It is important not to miss your dose. Call your doctor or health care professional if you are  unable to keep an appointment.  What may interact with this medicine?  Do not take this medicine with any of the following medications:  -other medicines containing denosumab  This medicine may also interact with the following medications:  -medicines that suppress the immune system  -medicines that treat cancer  -steroid medicines like prednisone or cortisone  This list may not describe all possible interactions. Give your health care provider a list of all the medicines, herbs, non-prescription drugs, or dietary supplements you use. Also tell them if you smoke, drink alcohol, or use illegal drugs. Some items may interact with your medicine.  What should I watch for while using this medicine?  Visit your doctor or health care professional for regular checks on your progress. Your doctor or health care professional may order blood tests and other tests to see how you are doing.  Call your doctor or health care professional if you get a cold or other infection while receiving this medicine. Do not treat yourself. This medicine may decrease your body's ability to fight infection.  You should make sure you get enough calcium and vitamin D while you are taking this medicine, unless your doctor tells you not to. Discuss the foods you eat and the vitamins you take with your health care professional.  See your dentist regularly. Brush and floss your teeth as directed. Before you have any dental work done, tell your dentist you are receiving this medicine.  Do   not become pregnant while taking this medicine or for 5 months after stopping it. Women should inform their doctor if they wish to become pregnant or think they might be pregnant. There is a potential for serious side effects to an unborn child. Talk to your health care professional or pharmacist for more information.  What side effects may I notice from receiving this medicine?  Side effects that you should report to your doctor or health care professional as soon as  possible:  -allergic reactions like skin rash, itching or hives, swelling of the face, lips, or tongue  -breathing problems  -chest pain  -fast, irregular heartbeat  -feeling faint or lightheaded, falls  -fever, chills, or any other sign of infection  -muscle spasms, tightening, or twitches  -numbness or tingling  -skin blisters or bumps, or is dry, peels, or red  -slow healing or unexplained pain in the mouth or jaw  -unusual bleeding or bruising  Side effects that usually do not require medical attention (Report these to your doctor or health care professional if they continue or are bothersome.):  -muscle pain  -stomach upset, gas  This list may not describe all possible side effects. Call your doctor for medical advice about side effects. You may report side effects to FDA at 1-800-FDA-1088.  Where should I keep my medicine?  This medicine is only given in a clinic, doctor's office, or other health care setting and will not be stored at home.  NOTE: This sheet is a summary. It may not cover all possible information. If you have questions about this medicine, talk to your doctor, pharmacist, or health care provider.      2016, Elsevier/Gold Standard. (2012-06-15 12:37:47)

## 2018-05-29 NOTE — Telephone Encounter (Signed)
Gave patient AVS and calendar of upcoming December appointments.  °

## 2018-07-15 ENCOUNTER — Other Ambulatory Visit: Payer: Self-pay | Admitting: Interventional Radiology

## 2018-07-15 ENCOUNTER — Other Ambulatory Visit: Payer: Self-pay | Admitting: *Deleted

## 2018-07-15 DIAGNOSIS — I864 Gastric varices: Secondary | ICD-10-CM

## 2018-08-11 ENCOUNTER — Encounter (HOSPITAL_COMMUNITY): Payer: Self-pay

## 2018-08-11 ENCOUNTER — Ambulatory Visit (HOSPITAL_COMMUNITY)
Admission: RE | Admit: 2018-08-11 | Discharge: 2018-08-11 | Disposition: A | Discharge status: Home / Self Care | Payer: BLUE CROSS/BLUE SHIELD | Source: Ambulatory Visit | Attending: Interventional Radiology | Admitting: Interventional Radiology

## 2018-08-11 ENCOUNTER — Ambulatory Visit
Admission: RE | Admit: 2018-08-11 | Discharge: 2018-08-11 | Disposition: A | Discharge status: Home / Self Care | Payer: BLUE CROSS/BLUE SHIELD | Source: Ambulatory Visit | Attending: Interventional Radiology | Admitting: Interventional Radiology

## 2018-08-11 DIAGNOSIS — I8500 Esophageal varices without bleeding: Secondary | ICD-10-CM | POA: Insufficient documentation

## 2018-08-11 DIAGNOSIS — K429 Umbilical hernia without obstruction or gangrene: Secondary | ICD-10-CM | POA: Diagnosis not present

## 2018-08-11 DIAGNOSIS — I864 Gastric varices: Secondary | ICD-10-CM

## 2018-08-11 DIAGNOSIS — I700 Atherosclerosis of aorta: Secondary | ICD-10-CM | POA: Diagnosis not present

## 2018-08-11 DIAGNOSIS — R161 Splenomegaly, not elsewhere classified: Secondary | ICD-10-CM | POA: Diagnosis not present

## 2018-08-11 DIAGNOSIS — I8501 Esophageal varices with bleeding: Secondary | ICD-10-CM | POA: Diagnosis not present

## 2018-08-11 HISTORY — PX: IR RADIOLOGIST EVAL & MGMT: IMG5224

## 2018-08-11 LAB — POCT I-STAT CREATININE: Creatinine, Ser: 1.2 mg/dL — ABNORMAL HIGH (ref 0.44–1.00)

## 2018-08-11 MED ORDER — IOPAMIDOL (ISOVUE-370) INJECTION 76%
INTRAVENOUS | Status: AC
  Filled 2018-08-11: qty 100

## 2018-08-11 MED ORDER — IOPAMIDOL (ISOVUE-370) INJECTION 76%
100.0000 mL | Freq: Once | INTRAVENOUS | Status: AC | PRN
  Administered 2018-08-11: 80 mL via INTRAVENOUS

## 2018-08-11 NOTE — Progress Notes (Signed)
Chief Complaint: Gastric varices  Referring Physician(s): Dr Laurence Spates   GI: Dr. [name] Hepatology: Nicole Roosevelt Locks PCP: Dr. Milagros Evener Hematology: Dr. Sarajane Jews Magrinat  History of Present Illness: Nicole [name] is a 62 y.o. female presenting as a scheduled follow up to [facility] today, SP BRTO for bleeding gastric varices performed 04/13/2016.    She was admitted on 04/11/2016 with lower GI bleeding, with discovery of gastric varices on CT imaging and endoscopy. Her Hgb dropped at her initial hospitalization from 14 to 8.   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   A BRTO was performed on 04/13/2016 for her GV's via the RCFV, with approach through the spontaneous gastro-spleno-renal shunt.   She was discharged home after recovery on 04/15/2016.  She has had interval CT's  04/14/2016, 11/06/2016, 06/12/2017, showing no evidence of recurrent/persistent large gastric varices.   She has had percutaneous liver biopsy performed 12/02/2016, with the path report confirming "no cirrhotic architecture" with "minimal portal inflammation".    She had transjugular pressure measurements performed 01/14/2017, which did confirm an elevated hepatic venous pressure gradient of 10mmHg.   Interval History: We last saw Nicole [name] in the clinic June of 2018.  She was doing fine, but then in September she was admitted for upper GI bleeding.    She was treated by Dr. Therisa Doyne at that time, with EGD and banding x7, 09/04/2017.  She had follow up with Dr. Therisa Doyne in November, with EGD and banding x 6 11/27/2017, and again in April, with EGD and banding x7, 04/28/2018.  Her most recent findings show no evidence of GV's.   Nicole [name] tells me that she will have upcoming appointment with Dr. Therisa Doyne for another likely EGD in the near future.   We had CT performed today, which shows evidence of  esophageal varices, and no residual gastric varices.  She again has splenomegaly, and no overt CT signs of cirrhosis.    She denies any recent episodes of hematemesis, melena, or BRBPR.  She has generally been feeling fine, and continues to complete all of her activities of daily living without problems.    Past Medical History:  Diagnosis Date  . Anemia    low iron  . Arthritis   . Blood transfusion 2012  . Breast CA ([place])    breast ca dx 06/2010, stage 4, right , Lung Metasis.Surgery, Chemo, Radiation  . Clot    jugular  ?2015  . Depression   . Esophageal varices ([place])   . Headache    Mirgraine- rare since menopause  . Heart murmur    mild, no cardiologist, from birth  . Hematemesis 08/2017  . History of kidney stones    no pain- shows in CT- small 1 mm bilaterally- kidney  . IBD (inflammatory bowel disease)    resolved  . Idiopathic cirrhosis ([place])    pt denies  . Peripheral vascular disease ([place]) few yrs ago   clot in right jugular  . Portal hypertension ([place])   . Radiation 07/21/12-09/07/12   5940 cGy  . Restless legs     Past Surgical History:  Procedure Laterality Date  . COLONOSCOPY  2016  . DILATION AND CURETTAGE OF UTERUS  2004?  . ESOPHAGEAL BANDING  09/04/2017   Procedure: ESOPHAGEAL BANDING;  Surgeon: [name], MD;  Location: [facility] ENDOSCOPY;  Service: Gastroenterology;;  . ESOPHAGEAL BANDING  11/27/2017   Procedure:  ESOPHAGEAL BANDING;  Surgeon: [name], MD;  Location: [facility] ENDOSCOPY;  Service: Gastroenterology;;  . ESOPHAGEAL BANDING N/A 04/28/2018   Procedure: ESOPHAGEAL BANDING;  Surgeon: [name], MD;  Location: [facility] ENDOSCOPY;  Service: Gastroenterology;  Laterality: N/A;  . ESOPHAGOGASTRODUODENOSCOPY N/A 04/28/2018   Procedure: ESOPHAGOGASTRODUODENOSCOPY (EGD);  Surgeon: [name], MD;  Location: [facility] ENDOSCOPY;  Service: Gastroenterology;  Laterality: N/A;  . ESOPHAGOGASTRODUODENOSCOPY (EGD) WITH PROPOFOL N/A 04/11/2016   Procedure:  ESOPHAGOGASTRODUODENOSCOPY (EGD) WITH PROPOFOL;  Surgeon: Clarene Essex, MD;  Location: [facility] ENDOSCOPY;  Service: Endoscopy;  Laterality: N/A;  . ESOPHAGOGASTRODUODENOSCOPY (EGD) WITH PROPOFOL N/A 07/08/2016   Procedure: ESOPHAGOGASTRODUODENOSCOPY (EGD) WITH PROPOFOL;  Surgeon: Laurence Spates, MD;  Location: [place];  Service: Endoscopy;  Laterality: N/A;  . ESOPHAGOGASTRODUODENOSCOPY (EGD) WITH PROPOFOL N/A 03/03/2017   Procedure: ESOPHAGOGASTRODUODENOSCOPY (EGD) WITH PROPOFOL;  Surgeon: Laurence Spates, MD;  Location: [place];  Service: Endoscopy;  Laterality: N/A;  . ESOPHAGOGASTRODUODENOSCOPY (EGD) WITH PROPOFOL N/A 09/04/2017   Procedure: ESOPHAGOGASTRODUODENOSCOPY (EGD) WITH PROPOFOL;  Surgeon: [name], MD;  Location: [place];  Service: Gastroenterology;  Laterality: N/A;  . ESOPHAGOGASTRODUODENOSCOPY (EGD) WITH PROPOFOL N/A 11/27/2017   Procedure: ESOPHAGOGASTRODUODENOSCOPY (EGD);  Surgeon: [name], MD;  Location: [place];  Service: Gastroenterology;  Laterality: N/A;  . IR GENERIC HISTORICAL  05/15/2016   IR RADIOLOGIST EVAL & MGMT 05/15/2016 Corrie Mckusick, DO [facility] INTERV RAD  . IR GENERIC HISTORICAL  01/14/2017   IR US GUIDE VASC ACCESS RIGHT 01/14/2017 Greggory Keen, MD [facility]  . IR GENERIC HISTORICAL  01/14/2017   IR VENOGRAM HEPATIC W HEMODYNAMIC EVALUATION 01/14/2017 Greggory Keen, MD [facility]  . IR RADIOLOGIST EVAL & MGMT  06/12/2017  . IR RADIOLOGIST EVAL & MGMT  08/11/2018  . MASTECTOMY  06/02/12   right breast with lymph node removal  . PORT-A-CATH REMOVAL    . PORTACATH PLACEMENT  2011   left side  . RADIOLOGY WITH ANESTHESIA N/A 04/13/2016   Procedure: RADIOLOGY WITH ANESTHESIA;  Surgeon: Corrie Mckusick, DO;  Location: [place];  Service: Anesthesiology;  Laterality: N/A;    Allergies: Patient has no known allergies.  Medications: Prior to Admission medications   Medication Sig Start Date End Date Taking? Authorizing Provider  Calcium  Carb-Cholecalciferol (CALCIUM 600+D3) 600-800 MG-UNIT TABS Take 1 tablet by mouth at bedtime.   Yes [provider]  Cholecalciferol (VITAMIN D3) 5000 units CAPS Take 5,000 Units by mouth daily.   Yes [provider]  denosumab (PROLIA) 60 MG/ML SOLN injection Inject 60 mg into the skin every 6 (six) months. Administer in upper arm, thigh, or abdomen   Yes [provider]  letrozole (FEMARA) 2.5 MG tablet TAKE 1 TABLET (2.5 MG TOTAL) BY MOUTH AT BEDTIME. 05/19/18  Yes Magrinat, Virgie Dad, MD  propranolol (INDERAL) 20 MG tablet Take 1 tablet (20 mg total) by mouth 2 (two) times daily. 09/07/17  Yes Modena Jansky, MD  venlafaxine XR (EFFEXOR-XR) 150 MG 24 hr capsule Take 1 capsule (150 mg total) by mouth daily with breakfast. 05/29/18  Yes Magrinat, Virgie Dad, MD  vitamin E 400 UNIT capsule Take 400 Units by mouth daily.   Yes [provider]  Magnesium 500 MG TABS Take 500 mg by mouth at bedtime.     [provider]     Family History  Problem Relation Age of Onset  . COPD Mother   . AAA (abdominal aortic aneurysm) Mother   . COPD Father   . Cancer Neg Hx  Social History   Socioeconomic History  . Marital status: Married    Spouse name: Not on file  . Number of children: Not on file  . Years of education: Not on file  . Highest education level: Not on file  Occupational History  . Not on file  Social Needs  . Financial resource strain: Not on file  . Food insecurity:    Worry: Not on file    Inability: Not on file  . Transportation needs:    Medical: Not on file    Non-medical: Not on file  Tobacco Use  . Smoking status: Never Smoker  . Smokeless tobacco: Never Used  Substance and Sexual Activity  . Alcohol use: No  . Drug use: No  . Sexual activity: Yes  Lifestyle  . Physical activity:    Days per week: Not on file    Minutes per session: Not on file  . Stress: Not on file  Relationships  . Social connections:    Talks on  phone: Not on file    Gets together: Not on file    Attends religious service: Not on file    Active member of club or organization: Not on file    Attends meetings of clubs or organizations: Not on file    Relationship status: Not on file  Other Topics Concern  . Not on file  Social History Narrative  . Not on file    Review of Systems: A 12 point ROS discussed and pertinent positives are indicated in the HPI above.  All other systems are negative.  Review of Systems  Vital Signs: BP 125/86   Pulse 82   Temp 97.9 F (36.6 C) (Oral)   Resp 15   Ht 5\' 4"  (1.626 m)   Wt 66.2 kg   SpO2 98%   BMI 25.06 kg/m   Physical Exam General: 62 yo female appearing younger than stated age.  Well-developed, well-nourished.  No distress. HEENT: Atraumatic, normocephalic.  Conjugate gaze, extra-ocular motor intact. No scleral icterus or scleral injection. No lesions on external ears, nose, lips, or gums.  Oral mucosa moist, pink.  Neck: Symmetric with no goiter enlargement.  Chest/Lungs:  Symmetric chest with inspiration/expiration.  No labored breathing.     Heart:   No JVD appreciated.  Abdomen:  Soft, NT/ND, with + bowel sounds.   Genito-urinary: Deferred Neurologic: Alert & Oriented to person, place, and time.   Normal affect and insight.  Appropriate questions.  Moving all 4 extremities with gross sensory intact.     Mallampati Score:     Imaging: Ir Radiologist Eval & Mgmt  Result Date: 08/11/2018 Please refer to notes tab for details about interventional procedure. (Op Note)   Labs:  CBC: Recent Labs    09/06/17 0529 09/07/17 0453 11/17/17 1355 05/21/18 1104  WBC 4.7 4.5 4.7 5.5  HGB 7.8* 7.8* 12.6 15.2  HCT 23.6* 23.6* 39.6 45.3  PLT 71* 74* 121* 114*    COAGS: Recent Labs    09/04/17 0510  INR 1.17    BMP: Recent Labs    09/05/17 0602 09/06/17 0529 09/07/17 0453 11/17/17 1355 05/21/18 1104 08/11/18 0903  NA 143 141 141 142 140  --   K 3.3* 3.4*  4.0 4.4 4.7  --   CL 118* 114* 114*  --  110*  --   CO2 17* 20* 21* 22 23  --   GLUCOSE 109* 102* 107* 86 79  --   BUN 16 9  10 12.5 16  --   CALCIUM 7.1* 7.3* 7.6* 9.2 9.0  --   CREATININE 0.88 0.82 0.90 1.1 1.11* 1.20*  GFRNONAA >60 >60 >60  --  52*  --   GFRAA >60 >60 >60  --  60*  --     LIVER FUNCTION TESTS: Recent Labs    09/04/17 0510 09/05/17 0602 11/17/17 1355 05/21/18 1104  BILITOT 0.9 0.5 0.49 0.7  AST 25 25 23 25   ALT 23 21 22 25   ALKPHOS 42 35* 55 73  PROT 5.5* 5.2* 7.0 7.4  ALBUMIN 3.4* 3.3* 3.8 4.1    TUMOR MARKERS: No results for input(s): AFPTM, CEA, CA199, CHROMGRNA in the last 8760 hours.  Assessment and Plan:  Nicole [name] is a 62 year old female with portal HTN, no cirrhosis (path proven), and prior episodes of GI hemorrhage, treated with BRTO for gastric varices, and esophageal varices with multiple banding.   We spent a good portion of today's visit re-visiting the anatomy related to the EV's and prior GV's, and the portal HTN that is causing the varices to form.  At this time, we do not have a reason for her portal HTN.    We also discussed that she basically is now in a period of active surveillance, with both her GI team and our [place] team.  She has another appointment with GI upcoming, with likely another EGD.  From out standpoint, TIPS would likely be the next step, and we did not discuss this beyond the general idea of a blood-flow shunt around the liver, and that it is typically reserved for emergencies.    For now, we will plan on seeing her again in 1 year follow up, or in the interval should she need an appointment.   Plan: - Repeat office visit in 12 months.  We will not schedule a CT at this time.  - I have encouraged her to observe all of her upcoming provider appointments, in particular with Dr. Therisa Doyne.   Electronically Signed: Corrie Mckusick 08/11/2018, 12:24 PM   I spent a total of    25 Minutes in face to face in clinical consultation,  greater than 50% of which was counseling/coordinating care for portal HTN, prior gastric variceal bleeding, SP BRTO.

## 2018-08-16 ENCOUNTER — Other Ambulatory Visit: Payer: Self-pay | Admitting: Oncology

## 2018-08-16 DIAGNOSIS — C50919 Malignant neoplasm of unspecified site of unspecified female breast: Secondary | ICD-10-CM

## 2018-08-16 DIAGNOSIS — C7800 Secondary malignant neoplasm of unspecified lung: Principal | ICD-10-CM

## 2018-08-18 ENCOUNTER — Other Ambulatory Visit: Payer: Self-pay | Admitting: Gastroenterology

## 2018-11-02 ENCOUNTER — Ambulatory Visit
Admission: RE | Admit: 2018-11-02 | Discharge: 2018-11-02 | Disposition: A | Discharge status: Home / Self Care | Payer: BLUE CROSS/BLUE SHIELD | Source: Ambulatory Visit | Attending: Oncology | Admitting: Oncology

## 2018-11-02 DIAGNOSIS — D696 Thrombocytopenia, unspecified: Secondary | ICD-10-CM

## 2018-11-02 DIAGNOSIS — M8589 Other specified disorders of bone density and structure, multiple sites: Secondary | ICD-10-CM | POA: Diagnosis not present

## 2018-11-02 DIAGNOSIS — M818 Other osteoporosis without current pathological fracture: Secondary | ICD-10-CM

## 2018-11-02 DIAGNOSIS — C7800 Secondary malignant neoplasm of unspecified lung: Secondary | ICD-10-CM

## 2018-11-02 DIAGNOSIS — C50411 Malignant neoplasm of upper-outer quadrant of right female breast: Secondary | ICD-10-CM

## 2018-11-02 DIAGNOSIS — Z780 Asymptomatic menopausal state: Secondary | ICD-10-CM | POA: Diagnosis not present

## 2018-11-02 DIAGNOSIS — C50911 Malignant neoplasm of unspecified site of right female breast: Secondary | ICD-10-CM

## 2018-11-02 DIAGNOSIS — K7460 Unspecified cirrhosis of liver: Secondary | ICD-10-CM

## 2018-11-02 DIAGNOSIS — Z170 Estrogen receptor positive status [ER+]: Secondary | ICD-10-CM

## 2018-11-02 DIAGNOSIS — I8511 Secondary esophageal varices with bleeding: Secondary | ICD-10-CM

## 2018-11-16 ENCOUNTER — Other Ambulatory Visit: Payer: Self-pay

## 2018-11-16 ENCOUNTER — Other Ambulatory Visit: Payer: Self-pay | Admitting: Family Medicine

## 2018-11-16 ENCOUNTER — Encounter (HOSPITAL_COMMUNITY): Payer: Self-pay | Admitting: Emergency Medicine

## 2018-11-16 DIAGNOSIS — Z1231 Encounter for screening mammogram for malignant neoplasm of breast: Secondary | ICD-10-CM

## 2018-11-16 NOTE — H&P (Signed)
History of Present Illness  General:  61/female with portal Hypertension without cirrhosis(liver biopsy 12/17 minimal portal inflammation), she was seen for hematemesis and melena and has had EGDs for variceal banding. On her last OV she was advised to take propranolol 20 mg BID and  Last colonoscopy from 2016 for screening showed diverticulosis otherwise normal and a repeat was recommended in 10 years. EGD from 11/27/17 showed grade 3 varices and 7 bands were placed. Repeat EGD was recommended in 6 weeks. Patient reports doing well, has regular BMs, denies black stools, blood in stool or vomiting blood. Denies acid reflux, nausea, abdominal pain, early satiety or bloating. She reports a good appetite. Denies abdominal distension/swelling or swelling of legs. Denies confusion or drowsiness.   Current Medications  Taking   Calcium Carbonate 600 MG Tablet 1 tablet Orally Twice a day   Effexor XR(Venlafaxine HCl ER) 150 MG Capsule Extended Release 24 Hour 1 capsule with food Orally Once a day, Notes: Dr. Jana Hakim   Femara(Letrozole) 2.5 MG Tablet 1 tablet Orally Once a day, Notes: Dr. Jana Hakim   Vitamin D3 Maximum Strength(Cholecalciferol) 5000 UNIT Capsule 1 capsule Orally Once a day   Vitamin E 100 UNIT Capsule 1 capsule Orally Once a day   Magnesium 500 MG Tablet 1 capsule with a meal Orally Once a day   Prolia(Denosumab) 60 MG/ML Solution Subcutaneous   Pantoprazole Sodium 40 MG Tablet Delayed Release 1 tablet Orally Once a day   Propranolol HCl 20 MG Tablet 1 tablet Orally Twice a day   Not-Taking   Propranolol HCl 20 MG Tablet 1 tablet Orally Twice a day   Medication List reviewed and reconciled with the patient    Past Medical History  Breast CA, right - Stage 4 Dx 07/06/2010.   Lung cancer(metastatic to lung).   Deep vein thrombosis in left side of neck (think due to her prior port-a-cath).   Lymphedema.   IBD.   Heart murmur.   Blood transfusion - 2012.   Radiation  07/21/2012-09/07/2012.   Leukopenia.   Osteoporosis.   Thrombocytopenia (persistent since chemo).    Surgical History  portacath placement, left side 2011  D&C 2004?  right mastectomy 06/02/2012  portacath removal 06/2014  colonoscopy 02/28/2015  esophageal varicies - B[facility]ITO - Dr. Watt Climes. Dr. Earleen Newport 03/2016  egd 08/2017   Family History  Father: deceased 75 yrs, heart disease, diagnosed with Coronary artery disease  Mother: deceased 64 yrs, CABG, Coronary artery disease  Sister 1: alive, polyps  Sister 2: alive  2 sister(s) .   Negative family hx colon cancer, and liver disease.   Social History  General:  Tobacco use  cigarettes: Never smoked Tobacco history last updated 09/10/2017 no EXPOSURE TO PASSIVE SMOKE.  Alcohol: yes, wine, Rare.  Caffeine: yes, coffee 1 serving daily, , tea daily- unsweet.  no Recreational drug use.  no Exercise.  Marital Status: married.  Children: none.  EDUCATION: Some College.  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  COMMUNICATION BARRIERS: none.    Allergies  N.K.D.A.   Hospitalization/Major Diagnostic Procedure  Not in the past year 01/2015  [place] procedure - see above 04/17  EGD 08/2017   Review of Systems  GI PROCEDURE:  no Pacemaker/ AICD, no. no Artificial heart valves. no MI/heart attack. no Abnormal heart rhythm. no Angina. no CVA. no Hypertension. no Hypotension. no Asthma, COPD. no Sleep apnea. no Seizure disorders. no Artificial joints. no Severe DJD. no Diabetes. Significant headaches YES. no  Vertigo. Depression/anxiety YES, depression. no Abnormal bleeding. no Kidney Disease. no Liver disease, no. no Chance of pregnancy. Blood transfusion yes, 2012. no Method of Birth Control. no Birth control pills.      Vital Signs  Wt 143.0, Wt change 12 lb, Ht 63.75, BMI 24.74, Temp 96.7, Pulse sitting 72, BP sitting 122/74.   Examination  Gastroenterology:: GENERAL APPEARANCE: Well  developed, well nourished, no active distress, pleasant, no acute distress .  SCLERA: anicteric.  CARDIOVASCULAR PMI LS border. Normal RRR w/o murmers or gallops. No peripheral edema.  RESPIRATORY Breath sounds normal. Respiration even and unlabored.  ABDOMEN No masses palpated. Liver and spleen not palpated, normal. Bowel sounds normal, Abdomen not distended.  EXTREMITIES: No edema, pulses intact.  NEURO: normal strength and reflexes, cranial nerves II-XII grossly intact, normal gait, no asterixis.  PSYCH: mood/affect normal.     Assessments   1. Esophageal varices determined by endoscopy - I85.00 (Primary)   Treatment  1. Esophageal varices determined by endoscopy  IMAGING: Esophagoscopy   Last EGD was in 4/19 and she had grade 3 varices and 7 bands were placed.

## 2018-11-17 ENCOUNTER — Ambulatory Visit (HOSPITAL_COMMUNITY): Payer: BLUE CROSS/BLUE SHIELD | Admitting: Anesthesiology

## 2018-11-17 ENCOUNTER — Ambulatory Visit (HOSPITAL_COMMUNITY)
Admission: RE | Admit: 2018-11-17 | Discharge: 2018-11-17 | Disposition: A | Discharge status: Home / Self Care | Payer: BLUE CROSS/BLUE SHIELD | Source: Ambulatory Visit | Attending: Gastroenterology | Admitting: Gastroenterology

## 2018-11-17 ENCOUNTER — Encounter (HOSPITAL_COMMUNITY): Payer: Self-pay | Admitting: *Deleted

## 2018-11-17 ENCOUNTER — Encounter (HOSPITAL_COMMUNITY)
Admission: RE | Disposition: A | Discharge status: Home / Self Care | Payer: Self-pay | Source: Ambulatory Visit | Attending: Gastroenterology

## 2018-11-17 DIAGNOSIS — I8500 Esophageal varices without bleeding: Secondary | ICD-10-CM | POA: Insufficient documentation

## 2018-11-17 DIAGNOSIS — F329 Major depressive disorder, single episode, unspecified: Secondary | ICD-10-CM | POA: Insufficient documentation

## 2018-11-17 DIAGNOSIS — R011 Cardiac murmur, unspecified: Secondary | ICD-10-CM | POA: Insufficient documentation

## 2018-11-17 DIAGNOSIS — Z86718 Personal history of other venous thrombosis and embolism: Secondary | ICD-10-CM | POA: Insufficient documentation

## 2018-11-17 DIAGNOSIS — Z8249 Family history of ischemic heart disease and other diseases of the circulatory system: Secondary | ICD-10-CM | POA: Diagnosis not present

## 2018-11-17 DIAGNOSIS — K3189 Other diseases of stomach and duodenum: Secondary | ICD-10-CM | POA: Diagnosis not present

## 2018-11-17 DIAGNOSIS — M810 Age-related osteoporosis without current pathological fracture: Secondary | ICD-10-CM | POA: Insufficient documentation

## 2018-11-17 DIAGNOSIS — K766 Portal hypertension: Secondary | ICD-10-CM | POA: Diagnosis not present

## 2018-11-17 DIAGNOSIS — I739 Peripheral vascular disease, unspecified: Secondary | ICD-10-CM | POA: Insufficient documentation

## 2018-11-17 DIAGNOSIS — K921 Melena: Secondary | ICD-10-CM | POA: Diagnosis not present

## 2018-11-17 DIAGNOSIS — K589 Irritable bowel syndrome without diarrhea: Secondary | ICD-10-CM | POA: Diagnosis not present

## 2018-11-17 DIAGNOSIS — M1990 Unspecified osteoarthritis, unspecified site: Secondary | ICD-10-CM | POA: Diagnosis not present

## 2018-11-17 HISTORY — PX: ESOPHAGOGASTRODUODENOSCOPY: SHX5428

## 2018-11-17 HISTORY — PX: ESOPHAGEAL BANDING: SHX5518

## 2018-11-17 SURGERY — EGD (ESOPHAGOGASTRODUODENOSCOPY)
Anesthesia: Monitor Anesthesia Care

## 2018-11-17 MED ORDER — SODIUM CHLORIDE 0.9 % IV SOLN: INTRAVENOUS | Status: DC

## 2018-11-17 MED ORDER — ONDANSETRON HCL 4 MG/2ML IJ SOLN
INTRAMUSCULAR | Status: DC | PRN
  Administered 2018-11-17: 4 mg via INTRAVENOUS

## 2018-11-17 MED ORDER — PROPOFOL 10 MG/ML IV BOLUS
INTRAVENOUS | Status: DC | PRN
  Administered 2018-11-17 (×2): 20 mg via INTRAVENOUS

## 2018-11-17 MED ORDER — PROPOFOL 500 MG/50ML IV EMUL
INTRAVENOUS | Status: DC | PRN
  Administered 2018-11-17: 125 ug/kg/min via INTRAVENOUS

## 2018-11-17 MED ORDER — PROPOFOL 10 MG/ML IV BOLUS
INTRAVENOUS | Status: AC
  Filled 2018-11-17: qty 40

## 2018-11-17 MED ORDER — LACTATED RINGERS IV SOLN
INTRAVENOUS | Status: DC
  Administered 2018-11-17: 1000 mL via INTRAVENOUS

## 2018-11-17 MED ORDER — LIDOCAINE 2% (20 MG/ML) 5 ML SYRINGE
INTRAMUSCULAR | Status: DC | PRN
  Administered 2018-11-17: 40 mg via INTRAVENOUS

## 2018-11-17 NOTE — Brief Op Note (Signed)
11/17/2018  11:11 AM  PATIENT:  Nicole [name]  62 y.o. female  PRE-OPERATIVE DIAGNOSIS:  Esophageal Varices  POST-OPERATIVE DIAGNOSIS:  esophageal varices  PROCEDURE:  Procedure(s): ESOPHAGOGASTRODUODENOSCOPY (EGD) (N/A) ESOPHAGEAL BANDING  SURGEON:  Surgeon(s) and Role:    [name], MD - Primary  PHYSICIAN ASSISTANT:   ASSISTANTS: Tory Emerald, Rn, Janie Billups, Tech  ANESTHESIA:   MAC  EBL:  0 mL   BLOOD ADMINISTERED:none  DRAINS: none   LOCAL MEDICATIONS USED:  NONE  SPECIMEN:  No Specimen  DISPOSITION OF SPECIMEN:  N/A  COUNTS:  YES  TOURNIQUET:  * No tourniquets in log *  DICTATION: .Dragon Dictation  PLAN OF CARE: Discharge to home after PACU  PATIENT DISPOSITION:  PACU - hemodynamically stable.   Delay start of Pharmacological VTE agent (>24hrs) due to surgical blood loss or risk of bleeding: no

## 2018-11-17 NOTE — Discharge Instructions (Signed)
Esophagogastroduodenoscopy, Care After °Refer to this sheet in the next few weeks. These instructions provide you with information about caring for yourself after your procedure. Your health care provider may also give you more specific instructions. Your treatment has been planned according to current medical practices, but problems sometimes occur. Call your health care provider if you have any problems or questions after your procedure. °What can I expect after the procedure? °After the procedure, it is common to have: °· A sore throat. °· Nausea. °· Bloating. °· Dizziness. °· Fatigue. ° °Follow these instructions at home: °· Do not eat or drink anything until the numbing medicine (local anesthetic) has worn off and your gag reflex has returned. You will know that the local anesthetic has worn off when you can swallow comfortably. °· Do not drive for 24 hours if you received a medicine to help you relax (sedative). °· If your health care provider took a tissue sample for testing during the procedure, make sure to get your test results. This is your responsibility. Ask your health care provider or the department performing the test when your results will be ready. °· Keep all follow-up visits as told by your health care provider. This is important. °Contact a health care provider if: °· You cannot stop coughing. °· You are not urinating. °· You are urinating less than usual. °Get help right away if: °· You have trouble swallowing. °· You cannot eat or drink. °· You have throat or chest pain that gets worse. °· You are dizzy or light-headed. °· You faint. °· You have nausea or vomiting. °· You have chills. °· You have a fever. °· You have severe abdominal pain. °· You have black, tarry, or bloody stools. °This information is not intended to replace advice given to you by your health care provider. Make sure you discuss any questions you have with your health care provider. °Document Released: 12/02/2012 Document  Revised: 05/23/2016 Document Reviewed: 11/09/2015 °Elsevier Interactive Patient Education © 2018 [employer] ° °

## 2018-11-17 NOTE — Op Note (Addendum)
[facility] Patient Name: Nicole [name] Procedure Date: 11/17/2018 MRN: [id number] Attending MD: [name] , MD Date of Birth: May 02, 1956 CSN: [id number] Age: 62 Admit Type: Outpatient Procedure:                Upper GI endoscopy Indications:              For therapy of esophageal varices Providers:                [name], MD, Elmer Ramp. Hinson, RN, [employer],                            Technician, Marla Roe, CRNA Referring MD:              Medicines:                Monitored Anesthesia Care Complications:            No immediate complications. Estimated blood loss:                            None. Estimated Blood Loss:     Estimated blood loss: none. Procedure:                Pre-Anesthesia Assessment:                           - Prior to the procedure, a History and Physical                            was performed, and patient medications and                            allergies were reviewed. The patient's tolerance of                            previous anesthesia was also reviewed. The risks                            and benefits of the procedure and the sedation                            options and risks were discussed with the patient.                            All questions were answered, and informed consent                            was obtained. Prior Anticoagulants: The patient has                            taken no previous anticoagulant or antiplatelet                            agents. ASA Grade Assessment: III - A patient with  severe systemic disease. After reviewing the risks                            and benefits, the patient was deemed in                            satisfactory condition to undergo the procedure.                           After obtaining informed consent, the endoscope was                            passed under direct vision. Throughout the                            procedure, the  patient's blood pressure, pulse, and                            oxygen saturations were monitored continuously. The                            GIF-H190 ([id number]) Olympus adult endoscope was                            introduced through the mouth, and advanced to the                            second part of duodenum. The upper GI endoscopy was                            accomplished without difficulty. The patient                            tolerated the procedure well. Scope In: Scope Out: Findings:      Grade II varices were found in the middle third of the esophagus and in       the lower third of the esophagus. They were medium in size. Four bands       were successfully placed with complete eradication, resulting in       deflation of varices. There was no bleeding during and at the end of the       procedure.      The Z-line was regular and was found 35 cm from the incisors.      Mild portal hypertensive gastropathy was found in the gastric body.      The cardia and gastric fundus were normal on retroflexion.      Localized mildly erythematous mucosa without bleeding was found in the       gastric antrum.      The examined duodenum was normal. Impression:               - Grade II esophageal varices. Completely                            eradicated. Banded.                           -  Z-line regular, 35 cm from the incisors.                           - Portal hypertensive gastropathy.                           - Erythematous mucosa in the antrum.                           - Normal examined duodenum.                           - No specimens collected. Moderate Sedation:      Patient did not receive moderate sedation for this procedure, but       instead received monitored anesthesia care. Recommendation:           - Patient has a contact number available for                            emergencies. The signs and symptoms of potential                            delayed  complications were discussed with the                            patient. Return to normal activities tomorrow.                            Written discharge instructions were provided to the                            patient.                           - Advance diet as tolerated.                           - Continue present medications.                           - Repeat upper endoscopy in 6 months for endoscopic                            band ligation. Procedure Code(s):        --- Professional ---                           [phone], Esophagogastroduodenoscopy, flexible,                            transoral; with band ligation of esophageal/gastric                            varices Diagnosis Code(s):        --- Professional ---  I85.00, Esophageal varices without bleeding                           K76.6, Portal hypertension                           K31.89, Other diseases of stomach and duodenum CPT copyright 2018 American Medical Association. All rights reserved. The codes documented in this report are preliminary and upon coder review may  be revised to meet current compliance requirements. [name], MD 11/17/2018 11:10:51 AM This report has been signed electronically. Number of Addenda: 0

## 2018-11-17 NOTE — Anesthesia Postprocedure Evaluation (Signed)
Anesthesia Post Note  Patient: Nicole [name]  Procedure(s) Performed: ESOPHAGOGASTRODUODENOSCOPY (EGD) (N/A ) ESOPHAGEAL BANDING     Patient location during evaluation: PACU Anesthesia Type: MAC Level of consciousness: awake and alert Pain management: pain level controlled Vital Signs Assessment: post-procedure vital signs reviewed and stable Respiratory status: spontaneous breathing, nonlabored ventilation, respiratory function stable and patient connected to nasal cannula oxygen Cardiovascular status: stable and blood pressure returned to baseline Postop Assessment: no apparent nausea or vomiting Anesthetic complications: no    Last Vitals:  Vitals:   11/17/18 1122 11/17/18 1130  BP: 134/83 129/80  Pulse: 65 63  Resp: 13 14  Temp:    SpO2: 100% 100%    Last Pain:  Vitals:   11/17/18 1130  TempSrc:   PainSc: 0-No pain                 Effie Berkshire

## 2018-11-17 NOTE — Anesthesia Procedure Notes (Signed)
Date/Time: 11/17/2018 10:59 AM Performed by: Talbot Grumbling, CRNA Oxygen Delivery Method: Nasal cannula

## 2018-11-17 NOTE — Transfer of Care (Signed)
Immediate Anesthesia Transfer of Care Note  Patient: Nicole [name]  Procedure(s) Performed: ESOPHAGOGASTRODUODENOSCOPY (EGD) (N/A ) ESOPHAGEAL BANDING  Patient Location: PACU  Anesthesia Type:MAC  Level of Consciousness: sedated  Airway & Oxygen Therapy: Patient Spontanous Breathing and Patient connected to nasal cannula oxygen  Post-op Assessment: Report given to RN and Post -op Vital signs reviewed and stable  Post vital signs: Reviewed and stable  Last Vitals:  Vitals Value Taken Time  BP    Temp    Pulse    Resp    SpO2      Last Pain:  Vitals:   11/17/18 1024  TempSrc: Oral  PainSc: 0-No pain         Complications: No apparent anesthesia complications

## 2018-11-17 NOTE — Anesthesia Preprocedure Evaluation (Addendum)
Anesthesia Evaluation  Patient identified by MRN, date of birth, ID band Patient awake    Reviewed: Allergy & Precautions, NPO status , Patient's Chart, lab work & pertinent test results  Airway Mallampati: II  TM Distance: >3 FB Neck ROM: Full    Dental  (+) Teeth Intact, Dental Advisory Given   Pulmonary    breath sounds clear to auscultation       Cardiovascular + Peripheral Vascular Disease   Rhythm:Regular Rate:Normal     Neuro/Psych  Headaches, Depression    GI/Hepatic negative GI ROS, (+) Cirrhosis   Esophageal Varices    ,   Endo/Other  negative endocrine ROS  Renal/GU negative Renal ROS     Musculoskeletal  (+) Arthritis ,   Abdominal Normal abdominal exam  (+)   Peds  Hematology   Anesthesia Other Findings   Reproductive/Obstetrics                            Anesthesia Physical Anesthesia Plan  ASA: III  Anesthesia Plan: MAC   Post-op Pain Management:    Induction: Intravenous  PONV Risk Score and Plan: 2 and Propofol infusion and Ondansetron  Airway Management Planned: Natural Airway and Nasal Cannula  Additional Equipment: None  Intra-op Plan:   Post-operative Plan:   Informed Consent: I have reviewed the patients History and Physical, chart, labs and discussed the procedure including the risks, benefits and alternatives for the proposed anesthesia with the patient or authorized representative who has indicated his/her understanding and acceptance.     Plan Discussed with: CRNA  Anesthesia Plan Comments:        Anesthesia Quick Evaluation

## 2018-11-18 ENCOUNTER — Encounter (HOSPITAL_COMMUNITY): Payer: Self-pay | Admitting: Gastroenterology

## 2018-11-22 ENCOUNTER — Other Ambulatory Visit: Payer: Self-pay | Admitting: Oncology

## 2018-11-22 DIAGNOSIS — C50919 Malignant neoplasm of unspecified site of unspecified female breast: Secondary | ICD-10-CM

## 2018-11-22 DIAGNOSIS — C7800 Secondary malignant neoplasm of unspecified lung: Principal | ICD-10-CM

## 2018-12-01 ENCOUNTER — Telehealth: Payer: Self-pay | Admitting: Oncology

## 2018-12-01 NOTE — Telephone Encounter (Signed)
R./s appt per 12/3 sch message - pt is aware of appt date and time

## 2018-12-02 ENCOUNTER — Inpatient Hospital Stay: Payer: BLUE CROSS/BLUE SHIELD

## 2018-12-02 ENCOUNTER — Inpatient Hospital Stay: Payer: BLUE CROSS/BLUE SHIELD | Admitting: Oncology

## 2018-12-07 ENCOUNTER — Inpatient Hospital Stay: Payer: BLUE CROSS/BLUE SHIELD | Attending: Oncology

## 2018-12-07 ENCOUNTER — Inpatient Hospital Stay (HOSPITAL_BASED_OUTPATIENT_CLINIC_OR_DEPARTMENT_OTHER): Payer: BLUE CROSS/BLUE SHIELD | Admitting: Adult Health

## 2018-12-07 ENCOUNTER — Inpatient Hospital Stay: Payer: BLUE CROSS/BLUE SHIELD

## 2018-12-07 ENCOUNTER — Encounter: Payer: Self-pay | Admitting: Adult Health

## 2018-12-07 ENCOUNTER — Telehealth: Payer: Self-pay | Admitting: Oncology

## 2018-12-07 VITALS — BP 127/76 | HR 67 | Temp 97.9°F | Resp 18 | Ht 64.0 in | Wt 145.2 lb

## 2018-12-07 DIAGNOSIS — K7460 Unspecified cirrhosis of liver: Secondary | ICD-10-CM

## 2018-12-07 DIAGNOSIS — Z170 Estrogen receptor positive status [ER+]: Secondary | ICD-10-CM | POA: Diagnosis not present

## 2018-12-07 DIAGNOSIS — Z9221 Personal history of antineoplastic chemotherapy: Secondary | ICD-10-CM

## 2018-12-07 DIAGNOSIS — I8511 Secondary esophageal varices with bleeding: Secondary | ICD-10-CM

## 2018-12-07 DIAGNOSIS — Z79899 Other long term (current) drug therapy: Secondary | ICD-10-CM | POA: Diagnosis not present

## 2018-12-07 DIAGNOSIS — Z86718 Personal history of other venous thrombosis and embolism: Secondary | ICD-10-CM | POA: Diagnosis not present

## 2018-12-07 DIAGNOSIS — Z87442 Personal history of urinary calculi: Secondary | ICD-10-CM | POA: Diagnosis not present

## 2018-12-07 DIAGNOSIS — Z9181 History of falling: Secondary | ICD-10-CM | POA: Insufficient documentation

## 2018-12-07 DIAGNOSIS — C50911 Malignant neoplasm of unspecified site of right female breast: Secondary | ICD-10-CM

## 2018-12-07 DIAGNOSIS — M25551 Pain in right hip: Secondary | ICD-10-CM

## 2018-12-07 DIAGNOSIS — Z9011 Acquired absence of right breast and nipple: Secondary | ICD-10-CM | POA: Insufficient documentation

## 2018-12-07 DIAGNOSIS — D696 Thrombocytopenia, unspecified: Secondary | ICD-10-CM

## 2018-12-07 DIAGNOSIS — C7802 Secondary malignant neoplasm of left lung: Secondary | ICD-10-CM

## 2018-12-07 DIAGNOSIS — R232 Flushing: Secondary | ICD-10-CM | POA: Insufficient documentation

## 2018-12-07 DIAGNOSIS — Z79811 Long term (current) use of aromatase inhibitors: Secondary | ICD-10-CM | POA: Diagnosis not present

## 2018-12-07 DIAGNOSIS — M810 Age-related osteoporosis without current pathological fracture: Secondary | ICD-10-CM

## 2018-12-07 DIAGNOSIS — C771 Secondary and unspecified malignant neoplasm of intrathoracic lymph nodes: Secondary | ICD-10-CM | POA: Diagnosis not present

## 2018-12-07 DIAGNOSIS — C50411 Malignant neoplasm of upper-outer quadrant of right female breast: Secondary | ICD-10-CM | POA: Diagnosis not present

## 2018-12-07 DIAGNOSIS — C7800 Secondary malignant neoplasm of unspecified lung: Principal | ICD-10-CM

## 2018-12-07 DIAGNOSIS — C7801 Secondary malignant neoplasm of right lung: Secondary | ICD-10-CM

## 2018-12-07 DIAGNOSIS — I739 Peripheral vascular disease, unspecified: Secondary | ICD-10-CM | POA: Diagnosis not present

## 2018-12-07 DIAGNOSIS — M818 Other osteoporosis without current pathological fracture: Secondary | ICD-10-CM

## 2018-12-07 LAB — COMPREHENSIVE METABOLIC PANEL
ALBUMIN: 3.8 g/dL (ref 3.5–5.0)
ALT: 24 U/L (ref 0–44)
ANION GAP: 11 (ref 5–15)
AST: 24 U/L (ref 15–41)
Alkaline Phosphatase: 65 U/L (ref 38–126)
BILIRUBIN TOTAL: 0.8 mg/dL (ref 0.3–1.2)
BUN: 17 mg/dL (ref 8–23)
CO2: 20 mmol/L — ABNORMAL LOW (ref 22–32)
Calcium: 9.4 mg/dL (ref 8.9–10.3)
Chloride: 110 mmol/L (ref 98–111)
Creatinine, Ser: 1.17 mg/dL — ABNORMAL HIGH (ref 0.44–1.00)
GFR calc Af Amer: 57 mL/min — ABNORMAL LOW (ref >60)
GFR calc non Af Amer: 50 mL/min — ABNORMAL LOW (ref >60)
GLUCOSE: 99 mg/dL (ref 70–99)
POTASSIUM: 4 mmol/L (ref 3.5–5.1)
Sodium: 141 mmol/L (ref 135–145)
TOTAL PROTEIN: 7 g/dL (ref 6.5–8.1)

## 2018-12-07 LAB — CBC WITH DIFFERENTIAL/PLATELET
Abs Immature Granulocytes: 0.04 10*3/uL (ref 0.00–0.07)
BASOS ABS: 0 10*3/uL (ref 0.0–0.1)
BASOS PCT: 0 %
EOS ABS: 0.1 10*3/uL (ref 0.0–0.5)
EOS PCT: 1 %
HEMATOCRIT: 43.4 % (ref 36.0–46.0)
Hemoglobin: 14.6 g/dL (ref 12.0–15.0)
Immature Granulocytes: 1 %
LYMPHS ABS: 0.8 10*3/uL (ref 0.7–4.0)
Lymphocytes Relative: 16 %
MCH: 30.5 pg (ref 26.0–34.0)
MCHC: 33.6 g/dL (ref 30.0–36.0)
MCV: 90.6 fL (ref 80.0–100.0)
Monocytes Absolute: 0.5 10*3/uL (ref 0.1–1.0)
Monocytes Relative: 10 %
NRBC: 0 % (ref 0.0–0.2)
Neutro Abs: 3.8 10*3/uL (ref 1.7–7.7)
Neutrophils Relative %: 72 %
Platelets: 121 10*3/uL — ABNORMAL LOW (ref 150–400)
RBC: 4.79 MIL/uL (ref 3.87–5.11)
RDW: 13.4 % (ref 11.5–15.5)
WBC: 5.2 10*3/uL (ref 4.0–10.5)

## 2018-12-07 MED ORDER — DENOSUMAB 60 MG/ML ~~LOC~~ SOSY
PREFILLED_SYRINGE | SUBCUTANEOUS | Status: AC
  Filled 2018-12-07: qty 1

## 2018-12-07 MED ORDER — DENOSUMAB 60 MG/ML ~~LOC~~ SOLN
60.0000 mg | Freq: Once | SUBCUTANEOUS | Status: AC
  Administered 2018-12-07: 60 mg via SUBCUTANEOUS

## 2018-12-07 NOTE — Progress Notes (Signed)
ID: Nicole [name]   DOB: 1956/09/12  MR#: [id number]  FVC#:[id number]  PCP:  GYN:  Nicole Bookbinder, MD OTHER MD: Nicole Pray, MD  CHIEF COMPLAINT:  Metastatic Breast Cancer  CURRENT TREATMENT: Letrozole, denosumab/Prolia   INTERVAL HISTORY: Nicole [name] returns today for follow-up and treatment of her estrogen receptor positive stage IV breast cancer accompanied by her husband. She continues on letrozole, with good tolerance. She has occasional hot flashes. She denies vaginal dryness.   She also receives denosumab/ Prolia every 6 months.  She is due for this today.    REVIEW OF SYSTEMS: Nicole [name] is doing well today.  She has occasional right hip pain, from a previous fall that caused a compression fracture.  This pain is unchanged and manageable.  She underwent repeat endoscopy with GI, and needed 4 bands for esophageal varices.    She is also taking Propranolol which has helped.  Nicole [name] denies any unusual headaches or vision changes.  She is without appetite changes, nausea, vomiting, bowel/bladder concerns.  She isn't having any chest pain, palpitations, cough, or shortness of breath.  Otherwise, a detailed ROS was conducted and non contributory.    BREAST CANCER HISTORY: From Nicole [name]'s 07/25/2010 note:  "This woman has not had any significant medical intervention for some time.  Her last mammogram was about seven years ago.  She noted a right breast mass about a year ago and did not seek immediate medical attention for this.  She ultimately, after some time, admitted this to her husband and self-referred herself for intervention.  She had a mammogram on 07/17/2010 with an ultrasound of the right breast.  This showed a lobulated mass upper right outer quadrant measuring at least 6.3 x 7.3 cm and large right axillary lymph nodes were also noted.  There was skin thickening overlying the mass.  On physical exam, this mass was about an 8 cm with skin dimpling noted. Discolored area in the  skin over the mass, fullness in the axilla.  Biopsy was recommended, which took place on 07/17/2010.  Pathology showed invasive ductal cancer involving both lymph node and breast.  The HER-2 was not amplified.  ER and PR both positive at 100% and 6% respectively.  Proliferative index was 12% involving both the lymph node and breast mass.  An MRI scan of both breasts was performed on 07/22/2010, essentially which showed a large heterogeneously enhancing mass of the right breast with washout kinetics measuring 6.4 x 4.4 x 5.0 cm.  Numerous satellite nodules were seen throughout the dominant mass.  There were several suspicious nodules in the upper central and upper inner quadrant of the right breast.  There were two enhancing subcentimeter nodules seen in the right pectoralis muscle.  No obvious chest wall nodules were seen; however, there was seen some metastatic adenopathy in the mediastinum as well as hila with a 2.4 x 2.0 cm mediastinal lymph node as well as 1.2 x 3.0 cm subcarinal lymph node.  There was a right hilar and probable left hilar adenopathy.  Bilateral pulmonary nodules were seen and a right T2 lesion was seen anterior right liver as well."  Her subsequent history is as detailed below.  PAST MEDICAL HISTORY: Past Medical History:  Diagnosis Date  . Blood transfusion 2012  . Breast CA ([place])    breast ca dx 06/2010, stage 4, right , Lung Metasis  . Clot    jugular  ?2015  . Depression   . Esophageal varices ([place])   .  Headache    Mirgraine- rare since menopause  . Heart murmur    mild, no cardiologist  . History of kidney stones    no pain- shows in CT- small 1 mm bilaterally- kidney  . IBD (inflammatory bowel disease)    resolved  . Peripheral vascular disease ([place])   . Radiation 07/21/12-09/07/12   5940 cGy    PAST SURGICAL HISTORY: Past Surgical History:  Procedure Laterality Date  . COLONOSCOPY    . DILATION AND CURETTAGE OF UTERUS  2004?  . ESOPHAGOGASTRODUODENOSCOPY (EGD)  WITH PROPOFOL N/A 04/11/2016   Procedure: ESOPHAGOGASTRODUODENOSCOPY (EGD) WITH PROPOFOL;  Surgeon: Nicole Essex, MD;  Location: [facility];  Service: Endoscopy;  Laterality: N/A;  . ESOPHAGOGASTRODUODENOSCOPY (EGD) WITH PROPOFOL N/A 07/08/2016   Procedure: ESOPHAGOGASTRODUODENOSCOPY (EGD) WITH PROPOFOL;  Surgeon: Laurence Spates, MD;  Location: [facility];  Service: Endoscopy;  Laterality: N/A;  . IR GENERIC HISTORICAL  05/15/2016   IR RADIOLOGIST EVAL & MGMT 05/15/2016 Nicole Mckusick, DO [facility] INTERV RAD  . MASTECTOMY  06/02/12   right breast  . PORT-A-CATH REMOVAL    . PORTACATH PLACEMENT  2011   left side  . RADIOLOGY WITH ANESTHESIA N/A 04/13/2016   Procedure: RADIOLOGY WITH ANESTHESIA;  Surgeon: Nicole Mckusick, DO;  Location: [facility];  Service: Anesthesiology;  Laterality: N/A;    FAMILY HISTORY Family History  Problem Relation Age of Onset  . Cancer Neg Hx   . COPD Mother   . AAA (abdominal aortic aneurysm) Mother   . COPD Father    patient's father died at the age of 4 from heart disease. Patient's mother died at the age of 34 status post CABG. The patient had no brothers, 2 sisters. There is no history of breast or ovarian cancer in the family.  GYNECOLOGIC HISTORY: Updated May 2018 Menarche age 72, she is GX P0. She does not recall when she went through menopause. She never took hormone replacement.  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 They have no children and no pets. They attend a local [place].   ADVANCED DIRECTIVES: Not in place  HEALTH MAINTENANCE: (updated 03/14/2014)  Social History  Substance Use Topics  . Smoking status: Never Smoker  . Smokeless tobacco: Never Used  . Alcohol use No     Colonoscopy: Never  PAP: Remote  Bone density: 09/27/2013, osteoporosis with a T score of -2.8  Lipid panel: Not on file     No Known Allergies  Current Outpatient Medications:  .  Calcium Carb-Cholecalciferol (CALCIUM 600+D3) 600-800 MG-UNIT TABS, Take 1 tablet by mouth at bedtime., Disp: , Rfl:  .  Cholecalciferol (VITAMIN D) 50 MCG (2000 UT) tablet, Take 2,000 Units by mouth daily., Disp: , Rfl:  .  denosumab (PROLIA) 60 MG/ML SOLN injection, Inject 60 mg into the skin every 6 (six) months. Administer in upper arm, thigh, or abdomen, Disp: , Rfl:  .  diphenhydrAMINE (BENADRYL) 25 MG tablet, Take 25 mg by mouth at bedtime as needed for sleep., Disp: , Rfl:  .  letrozole (FEMARA) 2.5 MG tablet, TAKE 1 TABLET (2.5 MG TOTAL) BY MOUTH AT BEDTIME., Disp: 90 tablet, Rfl: 0 .  Magnesium 500 MG TABS, Take 500 mg by mouth at bedtime. , Disp: , Rfl:  .  propranolol (INDERAL) 20 MG tablet, Take 1 tablet (20 mg total) by mouth 2 (two)  times daily., Disp: 60 tablet, Rfl: 0 .  venlafaxine XR (EFFEXOR-XR) 150 MG 24 hr capsule, Take 1 capsule (150 mg total) by mouth daily with breakfast., Disp: 90 capsule, Rfl: 4 No current facility-administered medications for this visit.   Facility-Administered Medications Ordered in Other Visits:  .  denosumab (PROLIA) injection 60 mg, 60 mg, Subcutaneous, Once, Magrinat, Virgie Dad, MD No current facility-administered medications for this visit.     OBJECTIVE:   Vitals:   12/07/18 1500  BP: 127/76  Pulse: 67  Resp: 18  Temp: 97.9 F (36.6 C)  TempSrc: Oral  SpO2: 99%  Weight: 145 lb 3.2 oz (65.9 kg)  Height: '5\' 4"'  (1.626 m)   GENERAL: Patient is a well appearing female in no acute distress HEENT:  Sclerae anicteric.  Oropharynx clear and moist. No ulcerations or evidence of oropharyngeal candidiasis. Neck is supple.  NODES:  No cervical, supraclavicular, or axillary lymphadenopathy palpated.  BREAST EXAM:  Right breast s/p mastectomy, telangectasia noted on scar line, no sign of local recurrence, left breast benign LUNGS:  Clear to auscultation bilaterally.  No wheezes or  rhonchi. HEART:  Regular rate and rhythm. No murmur appreciated. ABDOMEN:  Soft, nontender.  Positive, normoactive bowel sounds. No organomegaly palpated. MSK:  No focal spinal tenderness to palpation. Full range of motion bilaterally in the upper extremities. EXTREMITIES:  No peripheral edema.   SKIN:  Clear with no obvious rashes or skin changes. No nail dyscrasia. NEURO:  Nonfocal. Well oriented.  Appropriate affect.    LAB RESULTS: Lab Results  Component Value Date   WBC 3.8 05/09/2016   NEUTROABS 5.0 05/02/2016   HGB 10.9 (L) 05/09/2016   HCT 34.8 (L) 05/09/2016   MCV 92.1 05/09/2016   PLT 98 (L) 05/09/2016      Chemistry    CMP Latest Ref Rng & Units 08/11/2018 05/21/2018 11/17/2017  Glucose 70 - 140 mg/dL - 79 86  BUN 7 - 26 mg/dL - 16 12.5  Creatinine 0.44 - 1.00 mg/dL 1.20(H) 1.11(H) 1.1  Sodium 136 - 145 mmol/L - 140 142  Potassium 3.5 - 5.1 mmol/L - 4.7 4.4  Chloride 98 - 109 mmol/L - 110(H) -  CO2 22 - 29 mmol/L - 23 22  Calcium 8.4 - 10.4 mg/dL - 9.0 9.2  Total Protein 6.4 - 8.3 g/dL - 7.4 7.0  Total Bilirubin 0.2 - 1.2 mg/dL - 0.7 0.49  Alkaline Phos 40 - 150 U/L - 73 55  AST 5 - 34 U/L - 25 23  ALT 0 - 55 U/L - 25 22     STUDIES:  ASSESSMENT: 62 y.o. [place] woman presenting June 2011 with stage IV breast cancer involving the right breast upper outer quadrant, right axilla, mediastinal lymph nodes, and both lungs, but not the brain, liver, or bones  (1) positive right breast and right axillary lymph node biopsies 07/17/2010 of a clinical T4 N2 M1 invasive ductal carcinoma, grade 1, estrogen receptor 100% positive, progesterone receptor 6% positive, with an MIB-1 of 12%, and no HER-2 amplification.  (2) participated in Phase II tessetaxel study, receiving 2 cycles complicated by thrombocytopenia, anemia requiring transfusion, transaminase elevation, and afebrile neutropenia. Off-study as of September 2011. Chest CT scan September 2011 did show evidence of  response.  (3) on letrozole as of October 2011, with continuing response and good tolerance   (4) 06/02/2012 underwent right mastectomy and axillary lymph node sampling (3 lymph nodes removed, all with viable cancer as well as evidence of treatment  effect) for a ypT2 ypN1-2 invasive ductal carcinoma, grade 1, 98% estrogen receptor positive, progesterone receptor negative, with no HER-2 amplification  (5) left jugular vein DVT documented 06/16/2012; left sided Port-A-Cath removed mid July 2015; Coumadin stopped 09/07/2014.  (a) Left brachiocephalic v. slightly narrowed, with some Left lower neck collateralization noted on chest CT December 2015  (6) osteoporosis, was on alendronate, switched to denosumab/Prolia 11/07/2016  (7) portal hypertension, with splenomegaly, gastric varices, and intestinal edema  (a)  mild persistent leukopenia and thrombocytopenia likely due to splenomegaly  (b) iron deficiency secondary to above, status post Feraheme x2 December 2018  (8) restaging studies:  (a) chest CT 12/26/2017 shows a 4.5 mm left lower lobe pulmonary nodule which is unchanged.  There was no other evidence of active disease   PLAN:  Sabina is doing well today.  She has no sign of clinical recurrence.  She will continue on Letrozole daily and will proceed with Prolia today (so long as calcium is within parameters).  She will undergo CT chest on 12/28/18 and mammogram on 12/29/2018.    I reviewed her bone density testing with her which is much improved.  This is good news.  Juliann Pulse and I reviewed her hip achiness.  It is stable since her fall in the shower.  She knows to let us know if she has any issues with this, or if it gets worse because we can always do an xray to evaluate.    She will return in 6 months for labs, f/u with Dr. Jana Hakim, and an injection.  She knows to call for any other issues that may develop before then.  A total of (20) minutes of face-to-face time was spent with this  patient with greater than 50% of that time in counseling and care-coordination.   Wilber Bihari, NP 12/07/18 3:10 PM Medical Oncology and Hematology [facility] [address] [place], [place] [address] Tel. [phone]    Fax. [phone]

## 2018-12-07 NOTE — Patient Instructions (Signed)

## 2018-12-07 NOTE — Telephone Encounter (Signed)
Gave avs and calendar ° °

## 2018-12-08 LAB — CANCER ANTIGEN 27.29: CA 27.29: 26.7 U/mL (ref 0.0–38.6)

## 2018-12-28 ENCOUNTER — Ambulatory Visit (HOSPITAL_COMMUNITY): Payer: BLUE CROSS/BLUE SHIELD

## 2018-12-29 ENCOUNTER — Ambulatory Visit: Payer: BLUE CROSS/BLUE SHIELD

## 2019-01-26 ENCOUNTER — Other Ambulatory Visit: Payer: Self-pay | Admitting: *Deleted

## 2019-01-26 DIAGNOSIS — C50911 Malignant neoplasm of unspecified site of right female breast: Secondary | ICD-10-CM

## 2019-01-26 DIAGNOSIS — C7800 Secondary malignant neoplasm of unspecified lung: Principal | ICD-10-CM

## 2019-01-27 ENCOUNTER — Encounter (HOSPITAL_COMMUNITY): Payer: Self-pay

## 2019-01-27 ENCOUNTER — Inpatient Hospital Stay: Payer: BLUE CROSS/BLUE SHIELD | Attending: Oncology

## 2019-01-27 ENCOUNTER — Ambulatory Visit (HOSPITAL_COMMUNITY)
Admission: RE | Admit: 2019-01-27 | Discharge: 2019-01-27 | Disposition: A | Discharge status: Home / Self Care | Payer: BLUE CROSS/BLUE SHIELD | Source: Ambulatory Visit | Attending: Oncology | Admitting: Oncology

## 2019-01-27 DIAGNOSIS — Z9221 Personal history of antineoplastic chemotherapy: Secondary | ICD-10-CM | POA: Insufficient documentation

## 2019-01-27 DIAGNOSIS — C7801 Secondary malignant neoplasm of right lung: Secondary | ICD-10-CM | POA: Diagnosis not present

## 2019-01-27 DIAGNOSIS — C7800 Secondary malignant neoplasm of unspecified lung: Secondary | ICD-10-CM | POA: Insufficient documentation

## 2019-01-27 DIAGNOSIS — C7802 Secondary malignant neoplasm of left lung: Secondary | ICD-10-CM | POA: Insufficient documentation

## 2019-01-27 DIAGNOSIS — Z170 Estrogen receptor positive status [ER+]: Secondary | ICD-10-CM | POA: Insufficient documentation

## 2019-01-27 DIAGNOSIS — C50411 Malignant neoplasm of upper-outer quadrant of right female breast: Secondary | ICD-10-CM | POA: Diagnosis not present

## 2019-01-27 DIAGNOSIS — Z9011 Acquired absence of right breast and nipple: Secondary | ICD-10-CM | POA: Diagnosis not present

## 2019-01-27 DIAGNOSIS — C50911 Malignant neoplasm of unspecified site of right female breast: Secondary | ICD-10-CM | POA: Diagnosis not present

## 2019-01-27 DIAGNOSIS — C771 Secondary and unspecified malignant neoplasm of intrathoracic lymph nodes: Secondary | ICD-10-CM | POA: Insufficient documentation

## 2019-01-27 LAB — CBC WITH DIFFERENTIAL/PLATELET
Abs Immature Granulocytes: 0.05 10*3/uL (ref 0.00–0.07)
BASOS ABS: 0 10*3/uL (ref 0.0–0.1)
BASOS PCT: 1 %
EOS ABS: 0.1 10*3/uL (ref 0.0–0.5)
Eosinophils Relative: 2 %
HCT: 45.6 % (ref 36.0–46.0)
Hemoglobin: 15.4 g/dL — ABNORMAL HIGH (ref 12.0–15.0)
Immature Granulocytes: 1 %
Lymphocytes Relative: 12 %
Lymphs Abs: 0.7 10*3/uL (ref 0.7–4.0)
MCH: 30.1 pg (ref 26.0–34.0)
MCHC: 33.8 g/dL (ref 30.0–36.0)
MCV: 89.2 fL (ref 80.0–100.0)
MONO ABS: 0.5 10*3/uL (ref 0.1–1.0)
Monocytes Relative: 8 %
NRBC: 0 % (ref 0.0–0.2)
Neutro Abs: 4.4 10*3/uL (ref 1.7–7.7)
Neutrophils Relative %: 76 %
PLATELETS: 100 10*3/uL — AB (ref 150–400)
RBC: 5.11 MIL/uL (ref 3.87–5.11)
RDW: 13.2 % (ref 11.5–15.5)
WBC: 5.8 10*3/uL (ref 4.0–10.5)

## 2019-01-27 LAB — COMPREHENSIVE METABOLIC PANEL
ALT: 28 U/L (ref 0–44)
AST: 23 U/L (ref 15–41)
Albumin: 3.9 g/dL (ref 3.5–5.0)
Alkaline Phosphatase: 65 U/L (ref 38–126)
Anion gap: 8 (ref 5–15)
BUN: 13 mg/dL (ref 8–23)
CHLORIDE: 109 mmol/L (ref 98–111)
CO2: 24 mmol/L (ref 22–32)
Calcium: 9 mg/dL (ref 8.9–10.3)
Creatinine, Ser: 1.27 mg/dL — ABNORMAL HIGH (ref 0.44–1.00)
GFR calc non Af Amer: 45 mL/min — ABNORMAL LOW (ref >60)
GFR, EST AFRICAN AMERICAN: 52 mL/min — AB (ref >60)
Glucose, Bld: 94 mg/dL (ref 70–99)
Potassium: 4.4 mmol/L (ref 3.5–5.1)
SODIUM: 141 mmol/L (ref 135–145)
TOTAL PROTEIN: 7.2 g/dL (ref 6.5–8.1)
Total Bilirubin: 0.8 mg/dL (ref 0.3–1.2)

## 2019-01-27 MED ORDER — SODIUM CHLORIDE (PF) 0.9 % IJ SOLN
INTRAMUSCULAR | Status: AC
  Filled 2019-01-27: qty 50

## 2019-01-27 MED ORDER — IOHEXOL 300 MG/ML  SOLN
60.0000 mL | Freq: Once | INTRAMUSCULAR | Status: AC | PRN
  Administered 2019-01-27: 60 mL via INTRAVENOUS

## 2019-01-29 ENCOUNTER — Other Ambulatory Visit: Payer: Self-pay | Admitting: Oncology

## 2019-01-29 ENCOUNTER — Encounter: Payer: Self-pay | Admitting: Oncology

## 2019-01-29 DIAGNOSIS — C50411 Malignant neoplasm of upper-outer quadrant of right female breast: Secondary | ICD-10-CM

## 2019-01-29 DIAGNOSIS — Z170 Estrogen receptor positive status [ER+]: Secondary | ICD-10-CM

## 2019-01-29 NOTE — Progress Notes (Signed)
I called Nicole [name] to give her the report of her CT of the chest which shows a 0.8 cm nodule in the lung which was previously 0.5 cm.  This needs follow-up and I will set her up for a repeat CT of the chest before her return visit here in June.  I asked her to call us if she had any questions would like to visit at this point

## 2019-02-25 ENCOUNTER — Other Ambulatory Visit: Payer: Self-pay | Admitting: Oncology

## 2019-02-25 DIAGNOSIS — C7800 Secondary malignant neoplasm of unspecified lung: Principal | ICD-10-CM

## 2019-02-25 DIAGNOSIS — C50919 Malignant neoplasm of unspecified site of unspecified female breast: Secondary | ICD-10-CM

## 2019-03-19 ENCOUNTER — Inpatient Hospital Stay: Admission: RE | Admit: 2019-03-19 | Payer: BLUE CROSS/BLUE SHIELD | Source: Ambulatory Visit

## 2019-04-12 ENCOUNTER — Telehealth: Payer: Self-pay | Admitting: *Deleted

## 2019-04-12 NOTE — Telephone Encounter (Signed)
Medical records faxed to [place] [facility] [place]; [place] [phone]

## 2019-05-23 ENCOUNTER — Other Ambulatory Visit: Payer: Self-pay | Admitting: Oncology

## 2019-05-23 DIAGNOSIS — C50919 Malignant neoplasm of unspecified site of unspecified female breast: Secondary | ICD-10-CM

## 2019-05-23 DIAGNOSIS — C7800 Secondary malignant neoplasm of unspecified lung: Secondary | ICD-10-CM

## 2019-06-02 ENCOUNTER — Telehealth: Payer: Self-pay

## 2019-06-02 NOTE — Telephone Encounter (Signed)
Spoke with pt by phone to inform of lab appt added for tomorrow p/t CT scan. Lab appt on 6/9 has been cancelled

## 2019-06-03 ENCOUNTER — Ambulatory Visit (HOSPITAL_COMMUNITY)
Admission: RE | Admit: 2019-06-03 | Discharge: 2019-06-03 | Disposition: A | Discharge status: Home / Self Care | Payer: BC Managed Care – PPO | Source: Ambulatory Visit | Attending: Oncology | Admitting: Oncology

## 2019-06-03 ENCOUNTER — Other Ambulatory Visit: Payer: Self-pay

## 2019-06-03 ENCOUNTER — Inpatient Hospital Stay: Payer: BC Managed Care – PPO | Attending: Oncology

## 2019-06-03 DIAGNOSIS — Z79899 Other long term (current) drug therapy: Secondary | ICD-10-CM | POA: Insufficient documentation

## 2019-06-03 DIAGNOSIS — Z79811 Long term (current) use of aromatase inhibitors: Secondary | ICD-10-CM | POA: Insufficient documentation

## 2019-06-03 DIAGNOSIS — Z9011 Acquired absence of right breast and nipple: Secondary | ICD-10-CM | POA: Insufficient documentation

## 2019-06-03 DIAGNOSIS — F329 Major depressive disorder, single episode, unspecified: Secondary | ICD-10-CM | POA: Insufficient documentation

## 2019-06-03 DIAGNOSIS — Z170 Estrogen receptor positive status [ER+]: Secondary | ICD-10-CM

## 2019-06-03 DIAGNOSIS — C7800 Secondary malignant neoplasm of unspecified lung: Secondary | ICD-10-CM | POA: Insufficient documentation

## 2019-06-03 DIAGNOSIS — Z86718 Personal history of other venous thrombosis and embolism: Secondary | ICD-10-CM | POA: Insufficient documentation

## 2019-06-03 DIAGNOSIS — C7951 Secondary malignant neoplasm of bone: Secondary | ICD-10-CM | POA: Diagnosis not present

## 2019-06-03 DIAGNOSIS — D649 Anemia, unspecified: Secondary | ICD-10-CM | POA: Diagnosis not present

## 2019-06-03 DIAGNOSIS — C7931 Secondary malignant neoplasm of brain: Secondary | ICD-10-CM | POA: Insufficient documentation

## 2019-06-03 DIAGNOSIS — G2581 Restless legs syndrome: Secondary | ICD-10-CM | POA: Insufficient documentation

## 2019-06-03 DIAGNOSIS — C50411 Malignant neoplasm of upper-outer quadrant of right female breast: Secondary | ICD-10-CM | POA: Insufficient documentation

## 2019-06-03 DIAGNOSIS — C787 Secondary malignant neoplasm of liver and intrahepatic bile duct: Secondary | ICD-10-CM | POA: Insufficient documentation

## 2019-06-03 DIAGNOSIS — R918 Other nonspecific abnormal finding of lung field: Secondary | ICD-10-CM | POA: Diagnosis not present

## 2019-06-03 LAB — CBC WITH DIFFERENTIAL/PLATELET
Abs Immature Granulocytes: 0.08 10*3/uL — ABNORMAL HIGH (ref 0.00–0.07)
Basophils Absolute: 0 10*3/uL (ref 0.0–0.1)
Basophils Relative: 1 %
Eosinophils Absolute: 0.2 10*3/uL (ref 0.0–0.5)
Eosinophils Relative: 2 %
HCT: 44.9 % (ref 36.0–46.0)
Hemoglobin: 15.1 g/dL — ABNORMAL HIGH (ref 12.0–15.0)
Immature Granulocytes: 1 %
Lymphocytes Relative: 13 %
Lymphs Abs: 0.8 10*3/uL (ref 0.7–4.0)
MCH: 30.1 pg (ref 26.0–34.0)
MCHC: 33.6 g/dL (ref 30.0–36.0)
MCV: 89.4 fL (ref 80.0–100.0)
Monocytes Absolute: 0.5 10*3/uL (ref 0.1–1.0)
Monocytes Relative: 8 %
Neutro Abs: 4.7 10*3/uL (ref 1.7–7.7)
Neutrophils Relative %: 75 %
Platelets: 96 10*3/uL — ABNORMAL LOW (ref 150–400)
RBC: 5.02 MIL/uL (ref 3.87–5.11)
RDW: 13.9 % (ref 11.5–15.5)
WBC: 6.3 10*3/uL (ref 4.0–10.5)
nRBC: 0 % (ref 0.0–0.2)

## 2019-06-03 LAB — COMPREHENSIVE METABOLIC PANEL
ALT: 26 U/L (ref 0–44)
AST: 25 U/L (ref 15–41)
Albumin: 4.1 g/dL (ref 3.5–5.0)
Alkaline Phosphatase: 67 U/L (ref 38–126)
Anion gap: 10 (ref 5–15)
BUN: 14 mg/dL (ref 8–23)
CO2: 24 mmol/L (ref 22–32)
Calcium: 9.2 mg/dL (ref 8.9–10.3)
Chloride: 108 mmol/L (ref 98–111)
Creatinine, Ser: 1.2 mg/dL — ABNORMAL HIGH (ref 0.44–1.00)
GFR calc Af Amer: 55 mL/min — ABNORMAL LOW (ref >60)
GFR calc non Af Amer: 48 mL/min — ABNORMAL LOW (ref >60)
Glucose, Bld: 93 mg/dL (ref 70–99)
Potassium: 4.6 mmol/L (ref 3.5–5.1)
Sodium: 142 mmol/L (ref 135–145)
Total Bilirubin: 0.8 mg/dL (ref 0.3–1.2)
Total Protein: 7.2 g/dL (ref 6.5–8.1)

## 2019-06-03 MED ORDER — SODIUM CHLORIDE (PF) 0.9 % IJ SOLN
INTRAMUSCULAR | Status: AC
  Filled 2019-06-03: qty 50

## 2019-06-03 MED ORDER — IOHEXOL 300 MG/ML  SOLN
75.0000 mL | Freq: Once | INTRAMUSCULAR | Status: AC | PRN
  Administered 2019-06-03: 75 mL via INTRAVENOUS

## 2019-06-07 NOTE — Progress Notes (Signed)
[facility]  [phone] [phone]    ID: Nicole [name]   DOB: 10/31/1961  MR#: [id number]  BTD#:[id number]  Patient Care Team: Aretta Nip, MD as PCP - General (Family Medicine) Roosevelt Locks, CRNP as Nurse Practitioner (Nurse Practitioner) Rolm Bookbinder, MD as Consulting Physician (General Surgery) Laurence Spates, MD as Consulting Physician (Gastroenterology) [name], MD as Consulting Physician (Gastroenterology) , Virgie Dad, MD as Consulting Physician (Oncology) OTHER MD:  CHIEF COMPLAINT:  Metastatic Breast Cancer  CURRENT TREATMENT: Letrozole, denosumab/Prolia    INTERVAL HISTORY: Nicole [name] returns today for follow-up and treatment of her estrogen receptor positive stage IV breast cancer.   She continues on letrozole, with good tolerance. She has occasional hot flashes. She denies vaginal dryness.  She also receives denosumab/ Prolia every 6 months.  She is due for this today.   Since her last visit, she underwent repeat chest CT on 06/03/2019. This showed: no significant interval change in bilateral pulmonary nodules; previously prominent mediastinal lymph nodes are stable or decreased in size, no pathologically enlarged lymph nodes identified.  She is scheduled for screening left mammography on 06/17/2019.   REVIEW OF SYSTEMS: [name] reports not exercising "like I should." She doesn't take walks, and she states she has gained weight. The patient denies unusual headaches, visual changes, nausea, vomiting, stiff neck, dizziness, or gait imbalance. There has been no cough, phlegm production, or pleurisy, no chest pain or pressure, and no change in bowel or bladder habits. The patient denies fever, rash, bleeding, unexplained fatigue or unexplained weight loss. A detailed review of systems was otherwise entirely negative.   BREAST CANCER HISTORY: From Dr. Collier Salina Rubin's 07/25/2010 note:  "This woman has not had any  significant medical intervention for some time.  Her last mammogram was about seven years ago.  She noted a right breast mass about a year ago and did not seek immediate medical attention for this.  She ultimately, after some time, admitted this to her husband and self-referred herself for intervention.  She had a mammogram on 07/17/2010 with an ultrasound of the right breast.  This showed a lobulated mass upper right outer quadrant measuring at least 6.3 x 7.3 cm and large right axillary lymph nodes were also noted.  There was skin thickening overlying the mass.  On physical exam, this mass was about an 8 cm with skin dimpling noted. Discolored area in the skin over the mass, fullness in the axilla.  Biopsy was recommended, which took place on 07/17/2010.  Pathology showed invasive ductal cancer involving both lymph node and breast.  The HER-2 was not amplified.  ER and PR both positive at 100% and 6% respectively.  Proliferative index was 12% involving both the lymph node and breast mass.  An MRI scan of both breasts was performed on 07/22/2010, essentially which showed a large heterogeneously enhancing mass of the right breast with washout kinetics measuring 6.4 x 4.4 x 5.0 cm.  Numerous satellite nodules were seen throughout the dominant mass.  There were several suspicious nodules in the upper central and upper inner quadrant of the right breast.  There were two enhancing subcentimeter nodules seen in the right pectoralis muscle.  No obvious chest wall nodules were seen; however, there was seen some metastatic adenopathy in the mediastinum as well as hila with a 2.4 x 2.0 cm mediastinal lymph node as well as 1.2 x 3.0 cm subcarinal lymph node.  There was a right hilar and probable left hilar adenopathy.  Bilateral pulmonary nodules were seen and a right T2 lesion was seen anterior right liver as well."  Her subsequent history is as detailed below.   PAST MEDICAL HISTORY: Past Medical History:  Diagnosis  Date  . Anemia    low iron  . Arthritis   . Blood transfusion 2012  . Breast CA ([place])    breast ca dx 06/2010, stage 4, right , Lung Metasis.Surgery, Chemo, Radiation  . Clot    jugular  ?2015  . Depression   . Esophageal varices ([place])   . Headache    Mirgraine- rare since menopause  . Heart murmur    mild, no cardiologist, from birth  . Hematemesis 08/2017  . History of kidney stones    no pain- shows in CT- small 1 mm bilaterally- kidney  . IBD (inflammatory bowel disease)    resolved  . Idiopathic cirrhosis ([place])    pt denies  . Peripheral vascular disease ([place]) few yrs ago   clot in right jugular  . Portal hypertension ([place])   . Radiation 07/21/12-09/07/12   5940 cGy  . Restless legs     PAST SURGICAL HISTORY: Past Surgical History:  Procedure Laterality Date  . COLONOSCOPY  2016  . DILATION AND CURETTAGE OF UTERUS  2004?  . ESOPHAGEAL BANDING  09/04/2017   Procedure: ESOPHAGEAL BANDING;  Surgeon: [name], MD;  Location: [facility] ENDOSCOPY;  Service: Gastroenterology;;  . ESOPHAGEAL BANDING  11/27/2017   Procedure: ESOPHAGEAL BANDING;  Surgeon: [name], MD;  Location[facility] ENDOSCOPY;  Service: Gastroenterology;;  . ESOPHAGEAL BANDING N/A 04/28/2018   Procedure: ESOPHAGEAL BANDING;  Surgeon: [name], MD;  Location: [facility] ENDOSCOPY;  Service: Gastroenterology;  Laterality: N/A;  . ESOPHAGEAL BANDING  11/17/2018   Procedure: ESOPHAGEAL BANDING;  Surgeon: [name], MD;  Location: [facility] ENDOSCOPY;  Service: Gastroenterology;;  . ESOPHAGOGASTRODUODENOSCOPY N/A 04/28/2018   Procedure: ESOPHAGOGASTRODUODENOSCOPY (EGD);  Surgeon: [name], MD;  Location: [facility] ENDOSCOPY;  Service: Gastroenterology;  Laterality: N/A;  . ESOPHAGOGASTRODUODENOSCOPY N/A 11/17/2018   Procedure: ESOPHAGOGASTRODUODENOSCOPY (EGD);  Surgeon: [name], MD;  Location: [facility] ENDOSCOPY;  Service: Gastroenterology;  Laterality: N/A;  . ESOPHAGOGASTRODUODENOSCOPY (EGD) WITH PROPOFOL N/A 04/11/2016   Procedure:  ESOPHAGOGASTRODUODENOSCOPY (EGD) WITH PROPOFOL;  Surgeon: Clarene Essex, MD;  Location: [facility] ENDOSCOPY;  Service: Endoscopy;  Laterality: N/A;  . ESOPHAGOGASTRODUODENOSCOPY (EGD) WITH PROPOFOL N/A 07/08/2016   Procedure: ESOPHAGOGASTRODUODENOSCOPY (EGD) WITH PROPOFOL;  Surgeon: Laurence Spates, MD;  Location: [facility];  Service: Endoscopy;  Laterality: N/A;  . ESOPHAGOGASTRODUODENOSCOPY (EGD) WITH PROPOFOL N/A 03/03/2017   Procedure: ESOPHAGOGASTRODUODENOSCOPY (EGD) WITH PROPOFOL;  Surgeon: Laurence Spates, MD;  Location: [facility];  Service: Endoscopy;  Laterality: N/A;  . ESOPHAGOGASTRODUODENOSCOPY (EGD) WITH PROPOFOL N/A 09/04/2017   Procedure: ESOPHAGOGASTRODUODENOSCOPY (EGD) WITH PROPOFOL;  Surgeon: [name], MD;  Location: [facility];  Service: Gastroenterology;  Laterality: N/A;  . ESOPHAGOGASTRODUODENOSCOPY (EGD) WITH PROPOFOL N/A 11/27/2017   Procedure: ESOPHAGOGASTRODUODENOSCOPY (EGD);  Surgeon: [name], MD;  Location: [facility];  Service: Gastroenterology;  Laterality: N/A;  . IR GENERIC HISTORICAL  05/15/2016   IR RADIOLOGIST EVAL & MGMT 05/15/2016 Corrie Mckusick, DO [facility] INTERV RAD  . IR GENERIC HISTORICAL  01/14/2017   IR US GUIDE VASC ACCESS RIGHT 01/14/2017 Greggory Keen, MD [facility]  . IR GENERIC HISTORICAL  01/14/2017   IR VENOGRAM HEPATIC W HEMODYNAMIC EVALUATION 01/14/2017 Greggory Keen, MD [facility]  . IR RADIOLOGIST EVAL & MGMT  06/12/2017  . IR RADIOLOGIST EVAL & MGMT  08/11/2018  . MASTECTOMY  06/02/12  right breast with lymph node removal  . PORT-A-CATH REMOVAL    . PORTACATH PLACEMENT  2011   left side  . RADIOLOGY WITH ANESTHESIA N/A 04/13/2016   Procedure: RADIOLOGY WITH ANESTHESIA;  Surgeon: Corrie Mckusick, DO;  Location: [facility];  Service: Anesthesiology;  Laterality: N/A;    FAMILY HISTORY Family History  Problem Relation Age of Onset  . COPD Mother   . AAA (abdominal aortic aneurysm) Mother   . COPD Father   . Cancer Neg Hx   The patient's father died  at the age of 7 from heart disease. Patient's mother died at the age of 51 status post CABG. The patient had no brothers, 2 sisters. There is no history of breast or ovarian cancer in the family.   GYNECOLOGIC HISTORY: Updated May 2018 Menarche age 57, she is GX P0. She does not recall when she went through menopause. She never took hormone replacement.   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 She retired in November 2017 Husband Merry Proud is retired from [employer]. They have no children and no pets. They attend a local [place].   ADVANCED DIRECTIVES: Not in place  HEALTH MAINTENANCE: (updated 03/14/2014)  Social History   Tobacco Use  . Smoking status: Never Smoker  . Smokeless tobacco: Never Used  Substance Use Topics  . Alcohol use: No  . Drug use: No     Colonoscopy: Never  PAP: Remote  Bone density: 09/27/2013, osteoporosis with a T score of -2.8  Lipid panel: Not on file    No Known Allergies  Current Outpatient Medications  Medication Sig Dispense Refill  . Calcium Carb-Cholecalciferol (CALCIUM 600+D3) 600-800 MG-UNIT TABS Take 1 tablet by mouth at bedtime.    . Cholecalciferol (VITAMIN D) 50 MCG (2000 UT) tablet Take 2,000 Units by mouth daily.    [name] denosumab (PROLIA) 60 MG/ML SOLN injection Inject 60 mg into the skin every 6 (six) months. Administer in upper arm, thigh, or abdomen    . diphenhydrAMINE (BENADRYL) 25 MG tablet Take 25 mg by mouth at bedtime as needed for sleep.    [name] letrozole (FEMARA) 2.5 MG tablet TAKE 1 TABLET (2.5 MG TOTAL) BY MOUTH AT BEDTIME. 90 tablet 0  . Magnesium 500 MG TABS Take 500 mg by mouth at bedtime.     . propranolol (INDERAL) 20 MG tablet Take 1 tablet (20 mg total) by mouth 2 (two) times daily. 60 tablet 0  . venlafaxine XR (EFFEXOR-XR) 150 MG 24 hr capsule Take 1 capsule (150 mg total) by mouth daily with breakfast. 90 capsule 4   No current  facility-administered medications for this visit.    Facility-Administered Medications Ordered in Other Visits  Medication Dose Route Frequency Provider Last Rate Last Dose  . denosumab (PROLIA) injection 60 mg  60 mg Subcutaneous Once , Virgie Dad, MD        OBJECTIVE: Middle-aged white woman in no acute distress  Vitals:   06/08/19 1126  BP: 114/84  Pulse: 77  Resp: 18  Temp: 98.7 F (37.1 C)  TempSrc: Oral  SpO2: 100%  Weight: 150 lb 9.6 oz (68.3 kg)  Height: '5\' 4"'  (1.626 m)   Sclerae unicteric, EOMs intact Wearing a mask No cervical or supraclavicular adenopathy Lungs no rales or rhonchi Heart regular rate and rhythm Abd soft, nontender, positive bowel sounds MSK no focal spinal tenderness, no upper extremity lymphedema Neuro: nonfocal,  well oriented, appropriate affect Breasts: The right breast is status post mastectomy and radiation.  There are telangiectasias which have been present before and which the patient understands are benign.  The left breast is benign.  Both axillae are benign.    LAB RESULTS: Lab Results  Component Value Date   WBC 3.8 05/09/2016   NEUTROABS 5.0 05/02/2016   HGB 10.9 (L) 05/09/2016   HCT 34.8 (L) 05/09/2016   MCV 92.1 05/09/2016   PLT 98 (L) 05/09/2016      Chemistry    CMP Latest Ref Rng & Units 06/03/2019 01/27/2019 12/07/2018  Glucose 70 - 99 mg/dL 93 94 99  BUN 8 - 23 mg/dL '14 13 17  ' Creatinine 0.44 - 1.00 mg/dL 1.20(H) 1.27(H) 1.17(H)  Sodium 135 - 145 mmol/L 142 141 141  Potassium 3.5 - 5.1 mmol/L 4.6 4.4 4.0  Chloride 98 - 111 mmol/L 108 109 110  CO2 22 - 32 mmol/L 24 24 20(L)  Calcium 8.9 - 10.3 mg/dL 9.2 9.0 9.4  Total Protein 6.5 - 8.1 g/dL 7.2 7.2 7.0  Total Bilirubin 0.3 - 1.2 mg/dL 0.8 0.8 0.8  Alkaline Phos 38 - 126 U/L 67 65 65  AST 15 - 41 U/L '25 23 24  ' ALT 0 - 44 U/L '26 28 24     ' STUDIES: Ct Chest W Contrast  Result Date: 06/03/2019 CLINICAL DATA:  History of breast cancer with lung metastases.  Restaging. EXAM: CT CHEST WITH CONTRAST TECHNIQUE: Multidetector CT imaging of the chest was performed during intravenous contrast administration. CONTRAST:  48m OMNIPAQUE IOHEXOL 300 MG/ML  SOLN COMPARISON:  01/27/2019 FINDINGS: Cardiovascular: Normal heart size. Aortic atherosclerosis. There is stenosis of the left innominate vein with resultant collateral formation. Unchanged. Mediastinum/Nodes: Normal appearance of the thyroid gland. The trachea appears patent and is midline. No mediastinal or hilar adenopathy identified. The previous index right paratracheal node measures 6 mm, image 56/2. Previously 1 cm. Previous index left paratracheal lymph node measures 6 mm, image 53/2. Previously 7 mm. Previous index subcarinal lymph node measures 9 mm, image 64/2. Unchanged Lungs/Pleura: No pleural effusion. Subpleural fibrotic changes within the anterolateral right lung compatible with changes from external beam radiation. Index nodule in the left lower lobe measures 0.9 cm, image 61/7. Previously 0.8 cm. Right lower lobe lung nodule measures 1.2 cm, image 70/7. Previously 1.3 cm. 1 cm subpleural nodule in the anterolateral right upper lobe is unchanged, image 48/7 Upper Abdomen: No acute abnormality. Right lobe of liver cyst is again noted. Musculoskeletal: Spondylosis within the thoracic spine. No aggressive lytic or sclerotic bone. Previous right mastectomy. Lesions. IMPRESSION: 1. No significant interval change in bilateral pulmonary nodules. 2. Previously referenced prominent mediastinal lymph nodes are stable or decreased in size in the interval. No pathologically enlarged lymph nodes identified at this time. Electronically Signed   By: TKerby MoorsM.D.   On: 06/03/2019 15:45     ASSESSMENT: 63y.o. [place] woman presenting June 2011 with stage IV breast cancer involving the right breast upper outer quadrant, right axilla, mediastinal lymph nodes, and both lungs, but not the brain, liver, or  bones  (1) positive right breast and right axillary lymph node biopsies 07/17/2010 of a clinical T4 N2 M1 invasive ductal carcinoma, grade 1, estrogen receptor 100% positive, progesterone receptor 6% positive, with an MIB-1 of 12%, and no HER-2 amplification.  (2) participated in Phase II tessetaxel study, receiving 2 cycles complicated by thrombocytopenia, anemia requiring transfusion, transaminase elevation, and afebrile neutropenia. Off-study as  of September 2011. Chest CT scan September 2011 did show evidence of response.  (3) on letrozole as of October 2011, with continuing response and good tolerance   (4) 06/02/2012 underwent right mastectomy and axillary lymph node sampling (3 lymph nodes removed, all with viable cancer as well as evidence of treatment effect) for a ypT2 ypN1-2 invasive ductal carcinoma, grade 1, 98% estrogen receptor positive, progesterone receptor negative, with no HER-2 amplification  (5) left jugular vein DVT documented 06/16/2012; left sided Port-A-Cath removed mid July 2015; Coumadin stopped 09/07/2014.  (a) Left brachiocephalic v. slightly narrowed, with some Left lower neck collateralization noted on chest CT December 2015  (6) osteoporosis, was on alendronate, switched to denosumab/Prolia 11/07/2016  (7) portal hypertension, with splenomegaly, gastric varices, and intestinal edema  (a)  mild persistent leukopenia and thrombocytopenia likely due to splenomegaly  (b) iron deficiency secondary to above, status post Feraheme x2 December 2018  (8) restaging studies:  (a) chest CT 12/26/2017 shows a 4.5 mm left lower lobe pulmonary nodule which is unchanged.  There was no other evidence of active disease   PLAN:  Sharifa is now 6-1/2 years out from her most recent surgery for breast cancer, with no evidence of active disease.  She is 9 years out from initial diagnosis of metastatic breast cancer.  She is tolerating her treatment remarkably well and we are making no  changes in her letrozole or Prolia, the latter of which she is receiving today.  We reviewed her CT scan which again is very favorable.  Her mammography had to be postponed because of the current pandemic but is scheduled for later this month  She will see me again in 6 months with her next Prolia dose.  We are making no changes in her treatment.  The plan is to continue this therapy until there is evidence of disease progression  I have given her some hints on how she can lose some weight.  She also needs to increase her exercise plan.  She knows to call for any other issue that may develop before the next visit.  Virgie Dad. , MD 06/08/19 12:02 PM Medical Oncology and Hematology [facility] [address] [place], [place] [address] Tel. [phone]    Fax. [phone]   I, Wilburn Mylar, am acting as scribe for Dr. Virgie Dad. .  I, Lurline Del MD, have reviewed the above documentation for accuracy and completeness, and I agree with the above.

## 2019-06-08 ENCOUNTER — Inpatient Hospital Stay: Payer: BC Managed Care – PPO

## 2019-06-08 ENCOUNTER — Other Ambulatory Visit: Payer: Self-pay

## 2019-06-08 ENCOUNTER — Inpatient Hospital Stay (HOSPITAL_BASED_OUTPATIENT_CLINIC_OR_DEPARTMENT_OTHER): Payer: BC Managed Care – PPO | Admitting: Oncology

## 2019-06-08 VITALS — BP 114/84 | HR 77 | Temp 98.7°F | Resp 18 | Ht 64.0 in | Wt 150.6 lb

## 2019-06-08 DIAGNOSIS — C50919 Malignant neoplasm of unspecified site of unspecified female breast: Secondary | ICD-10-CM

## 2019-06-08 DIAGNOSIS — C50411 Malignant neoplasm of upper-outer quadrant of right female breast: Secondary | ICD-10-CM

## 2019-06-08 DIAGNOSIS — Z9011 Acquired absence of right breast and nipple: Secondary | ICD-10-CM

## 2019-06-08 DIAGNOSIS — C7951 Secondary malignant neoplasm of bone: Secondary | ICD-10-CM

## 2019-06-08 DIAGNOSIS — C787 Secondary malignant neoplasm of liver and intrahepatic bile duct: Secondary | ICD-10-CM

## 2019-06-08 DIAGNOSIS — C7931 Secondary malignant neoplasm of brain: Secondary | ICD-10-CM

## 2019-06-08 DIAGNOSIS — F329 Major depressive disorder, single episode, unspecified: Secondary | ICD-10-CM

## 2019-06-08 DIAGNOSIS — D649 Anemia, unspecified: Secondary | ICD-10-CM | POA: Diagnosis not present

## 2019-06-08 DIAGNOSIS — Z170 Estrogen receptor positive status [ER+]: Secondary | ICD-10-CM | POA: Diagnosis not present

## 2019-06-08 DIAGNOSIS — Z79811 Long term (current) use of aromatase inhibitors: Secondary | ICD-10-CM | POA: Diagnosis not present

## 2019-06-08 DIAGNOSIS — G2581 Restless legs syndrome: Secondary | ICD-10-CM

## 2019-06-08 DIAGNOSIS — Z86718 Personal history of other venous thrombosis and embolism: Secondary | ICD-10-CM | POA: Diagnosis not present

## 2019-06-08 DIAGNOSIS — Z79899 Other long term (current) drug therapy: Secondary | ICD-10-CM

## 2019-06-08 DIAGNOSIS — C7800 Secondary malignant neoplasm of unspecified lung: Secondary | ICD-10-CM | POA: Diagnosis not present

## 2019-06-08 DIAGNOSIS — M810 Age-related osteoporosis without current pathological fracture: Secondary | ICD-10-CM

## 2019-06-08 MED ORDER — DENOSUMAB 60 MG/ML ~~LOC~~ SOSY
60.0000 mg | PREFILLED_SYRINGE | Freq: Once | SUBCUTANEOUS | Status: AC
  Administered 2019-06-08: 60 mg via SUBCUTANEOUS

## 2019-06-08 MED ORDER — DENOSUMAB 60 MG/ML ~~LOC~~ SOSY
PREFILLED_SYRINGE | SUBCUTANEOUS | Status: AC
  Filled 2019-06-08: qty 1

## 2019-06-08 MED ORDER — DENOSUMAB 60 MG/ML ~~LOC~~ SOLN: 60.0000 mg | Freq: Once | SUBCUTANEOUS | Status: DC

## 2019-06-08 NOTE — Patient Instructions (Signed)

## 2019-06-10 ENCOUNTER — Telehealth: Payer: Self-pay | Admitting: Oncology

## 2019-06-10 NOTE — Telephone Encounter (Signed)
I left a message regarding schedule will mail also °

## 2019-06-17 ENCOUNTER — Ambulatory Visit
Admission: RE | Admit: 2019-06-17 | Discharge: 2019-06-17 | Disposition: A | Discharge status: Home / Self Care | Payer: BC Managed Care – PPO | Source: Ambulatory Visit | Attending: Family Medicine | Admitting: Family Medicine

## 2019-06-17 ENCOUNTER — Other Ambulatory Visit: Payer: Self-pay

## 2019-06-17 DIAGNOSIS — Z1231 Encounter for screening mammogram for malignant neoplasm of breast: Secondary | ICD-10-CM

## 2019-06-17 DIAGNOSIS — Z853 Personal history of malignant neoplasm of breast: Secondary | ICD-10-CM | POA: Diagnosis not present

## 2019-07-05 ENCOUNTER — Other Ambulatory Visit: Payer: Self-pay | Admitting: Gastroenterology

## 2019-07-13 ENCOUNTER — Other Ambulatory Visit: Payer: Self-pay | Admitting: Gastroenterology

## 2019-08-12 ENCOUNTER — Other Ambulatory Visit (HOSPITAL_COMMUNITY)
Admission: RE | Admit: 2019-08-12 | Discharge: 2019-08-12 | Disposition: A | Discharge status: Home / Self Care | Payer: BC Managed Care – PPO | Source: Ambulatory Visit | Attending: Gastroenterology | Admitting: Gastroenterology

## 2019-08-12 DIAGNOSIS — Z01812 Encounter for preprocedural laboratory examination: Secondary | ICD-10-CM | POA: Diagnosis not present

## 2019-08-12 DIAGNOSIS — Z20828 Contact with and (suspected) exposure to other viral communicable diseases: Secondary | ICD-10-CM | POA: Diagnosis not present

## 2019-08-13 ENCOUNTER — Other Ambulatory Visit: Payer: Self-pay | Admitting: Interventional Radiology

## 2019-08-13 ENCOUNTER — Other Ambulatory Visit: Payer: Self-pay

## 2019-08-13 ENCOUNTER — Encounter (HOSPITAL_COMMUNITY): Payer: Self-pay | Admitting: Emergency Medicine

## 2019-08-13 DIAGNOSIS — I864 Gastric varices: Secondary | ICD-10-CM

## 2019-08-13 LAB — SARS CORONAVIRUS 2 (TAT 6-24 HRS): SARS Coronavirus 2: NEGATIVE

## 2019-08-13 NOTE — Progress Notes (Signed)
Pre-op endo call completed  

## 2019-08-13 NOTE — H&P (Signed)
History of Present Illness  General:  61/female with portal Hypertension without cirrhosis(liver biopsy 12/17 minimal portal inflammation), she was seen for hematemesis and melena and has had EGDs for variceal banding. On her last OV she was advised to take propranolol 20 mg BID and  Last colonoscopy from 2016 for screening showed diverticulosis otherwise normal and a repeat was recommended in 10 years. EGD from 11/27/17 showed grade 3 varices and 7 bands were placed. Repeat EGD was recommended in 6 weeks. Patient reports doing well, has regular BMs, denies black stools, blood in stool or vomiting blood. Denies acid reflux, nausea, abdominal pain, early satiety or bloating. She reports a good appetite. Denies abdominal distension/swelling or swelling of legs. Denies confusion or drowsiness.   Current Medications  Taking   Calcium Carbonate 600 MG Tablet 1 tablet Orally Twice a day   Effexor XR(Venlafaxine HCl ER) 150 MG Capsule Extended Release 24 Hour 1 capsule with food Orally Once a day, Notes: Dr. Jana Hakim   Femara(Letrozole) 2.5 MG Tablet 1 tablet Orally Once a day, Notes: Dr. Jana Hakim   Vitamin D3 Maximum Strength(Cholecalciferol) 5000 UNIT Capsule 1 capsule Orally Once a day   Vitamin E 100 UNIT Capsule 1 capsule Orally Once a day   Magnesium 500 MG Tablet 1 capsule with a meal Orally Once a day   Prolia(Denosumab) 60 MG/ML Solution Subcutaneous   Pantoprazole Sodium 40 MG Tablet Delayed Release 1 tablet Orally Once a day   Propranolol HCl 20 MG Tablet 1 tablet Orally Twice a day   Not-Taking   Propranolol HCl 20 MG Tablet 1 tablet Orally Twice a day   Medication List reviewed and reconciled with the patient    Past Medical History  Breast CA, right - Stage 4 Dx 07/06/2010.   Lung cancer(metastatic to lung).   Deep vein thrombosis in left side of neck (think due to her prior port-a-cath).   Lymphedema.   IBD.   Heart murmur.   Blood transfusion - 2012.   Radiation  07/21/2012-09/07/2012.   Leukopenia.   Osteoporosis.   Thrombocytopenia (persistent since chemo).    Surgical History  portacath placement, left side 2011  D&C 2004?  right mastectomy 06/02/2012  portacath removal 06/2014  colonoscopy 02/28/2015  esophageal varicies - [facility] - Dr. Watt Climes. Dr. Earleen Newport 03/2016  egd 08/2017   Family History  Father: deceased 18 yrs, heart disease, diagnosed with Coronary artery disease  Mother: deceased 47 yrs, CABG, Coronary artery disease  Sister 1: alive, polyps  Sister 2: alive  2 sister(s) .   Negative family hx colon cancer, and liver disease.   Social History  General:  Tobacco use  cigarettes: Never smoked Tobacco history last updated 09/10/2017 no EXPOSURE TO PASSIVE SMOKE.  Alcohol: yes, wine, Rare.  Caffeine: yes, coffee 1 serving daily, , tea daily- unsweet.  no Recreational drug use.  no Exercise.  Marital Status: married.  Children: none.  EDUCATION: Some College.  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  COMMUNICATION BARRIERS: none.    Allergies  N.K.D.A.   Hospitalization/Major Diagnostic Procedure  Not in the past year 01/2015  [place] procedure - see above 04/17  EGD 08/2017   Review of Systems  GI PROCEDURE:  no Pacemaker/ AICD, no. no Artificial heart valves. no MI/heart attack. no Abnormal heart rhythm. no Angina. no CVA. no Hypertension. no Hypotension. no Asthma, COPD. no Sleep apnea. no Seizure disorders. no Artificial joints. no Severe DJD. no Diabetes. Significant headaches YES. no  Vertigo. Depression/anxiety YES, depression. no Abnormal bleeding. no Kidney Disease. no Liver disease, no. no Chance of pregnancy. Blood transfusion yes, 2012. no Method of Birth Control. no Birth control pills.     Vital Signs  Wt 143.0, Wt change 12 lb, Ht 63.75, BMI 24.74, Temp 96.7, Pulse sitting 72, BP sitting 122/74.   Examination  Gastroenterology:: GENERAL APPEARANCE: Well developed,  well nourished, no active distress, pleasant, no acute distress .  SCLERA: anicteric.  CARDIOVASCULAR PMI LS border. Normal RRR w/o murmers or gallops. No peripheral edema.  RESPIRATORY Breath sounds normal. Respiration even and unlabored.  ABDOMEN No masses palpated. Liver and spleen not palpated, normal. Bowel sounds normal, Abdomen not distended.  EXTREMITIES: No edema, pulses intact.  NEURO: normal strength and reflexes, cranial nerves II-XII grossly intact, normal gait, no asterixis.  PSYCH: mood/affect normal.     Assessments   1. Esophageal varices determined by endoscopy - I85.00 (Primary)   Treatment  1. Esophageal varices determined by endoscopy  IMAGING: Esophagoscopy       Notes: Will schedule for an EGD wtih banding in the hopsital. Continue propranolo 20 mg BID, ideally HR should be aorund 60/min, currently 72/min, will likely increase dose of propranolol after getting the EGD done.     [name], MD

## 2019-08-14 IMAGING — CT CT CHEST WITH CONTRAST
2 of 4 series · 15 of 36 positions shown, 18 images · IV contrast (OMNIPAQUE)
Comparison: 01/27/2019

CLINICAL DATA: History of breast cancer with lung metastases.
Restaging.

EXAM:
CT CHEST WITH CONTRAST
TECHNIQUE: Multidetector CT imaging of the chest was performed during
intravenous contrast administration.
CONTRAST:  75mL OMNIPAQUE IOHEXOL 300 MG/ML  SOLN

[Series 2: axial st · axial · 0.77mm/px · z∈[-102,+180]mm · 12 of 165 slices shown, 15 images]
[im 12/165  mediastinal]
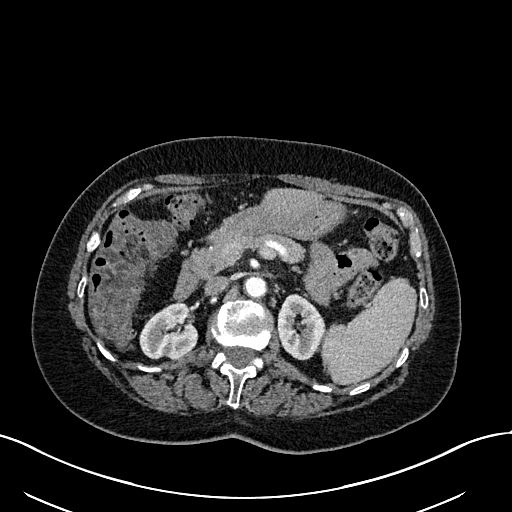
[im 12/165  lung]
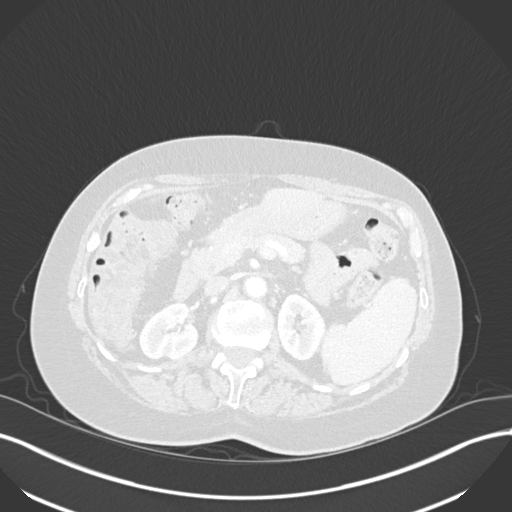
[im 24/165  lung]
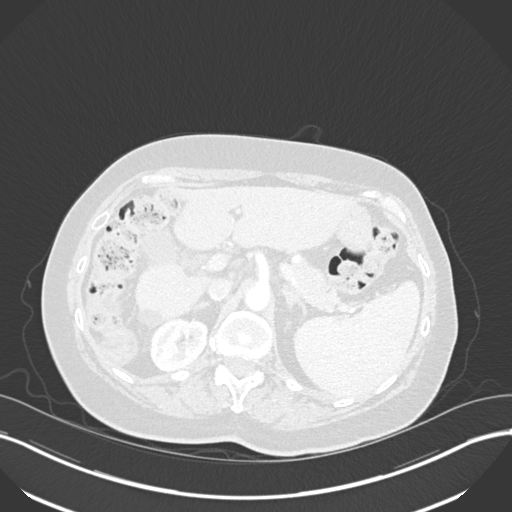
[im 36/165  lung]
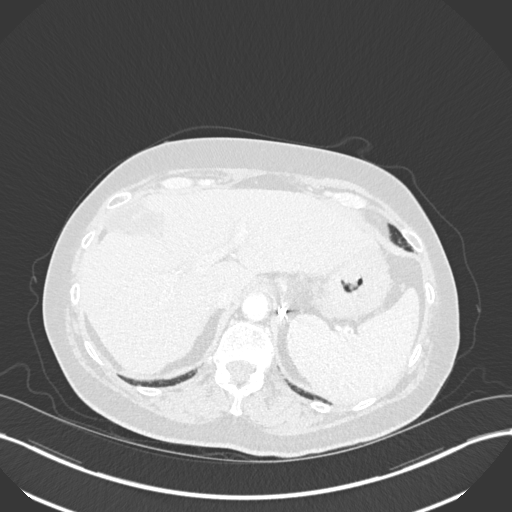
[im 47/165  lung]
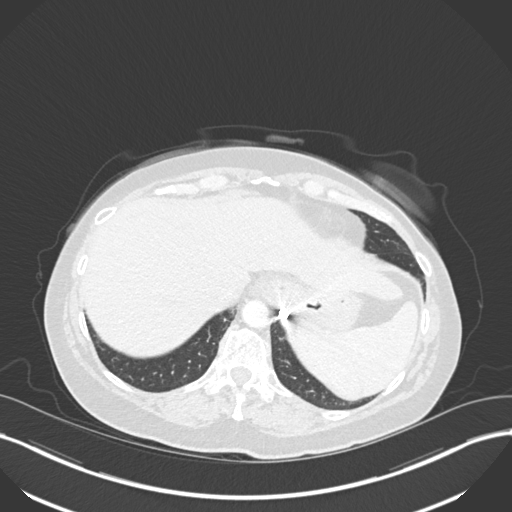
[im 59/165  mediastinal]
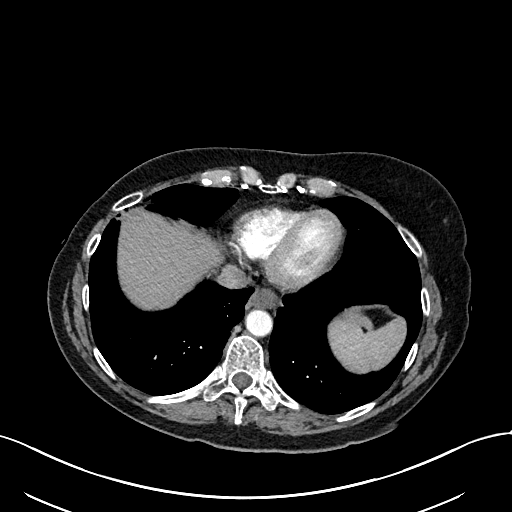
[im 59/165  lung]
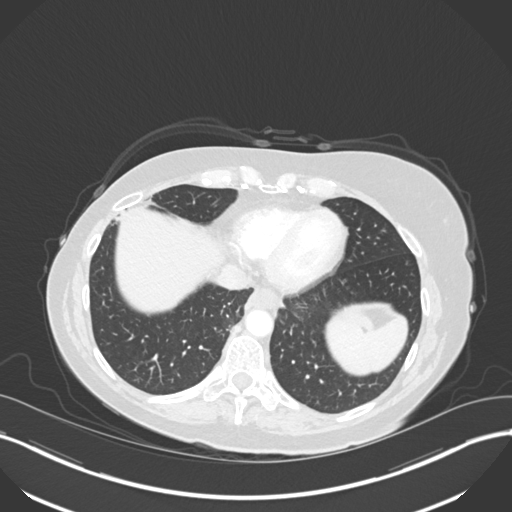
[im 71/165  lung]
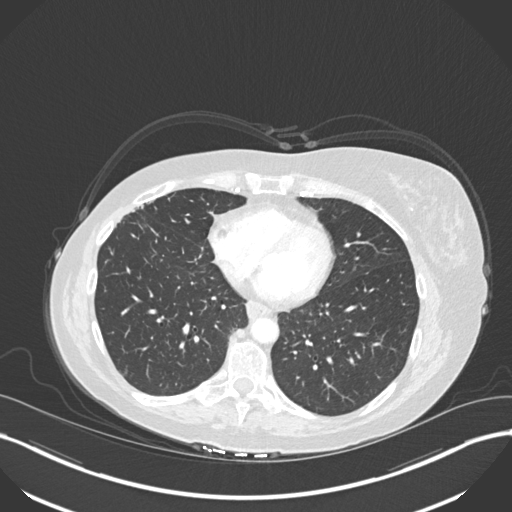
[im 94/165  lung]
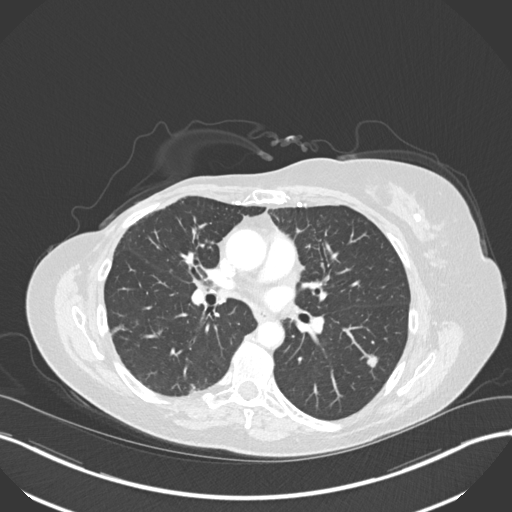
[im 106/165  lung]
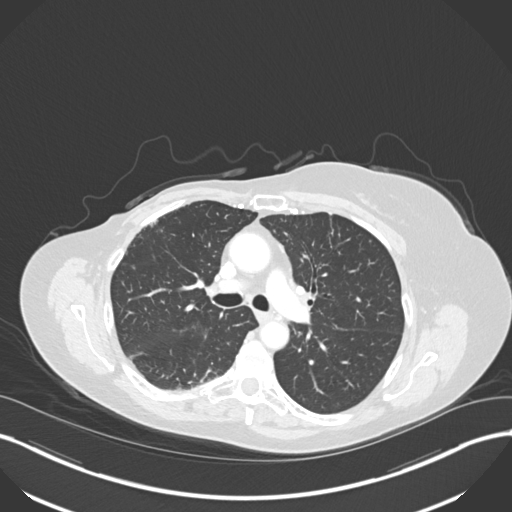
[im 118/165  mediastinal]
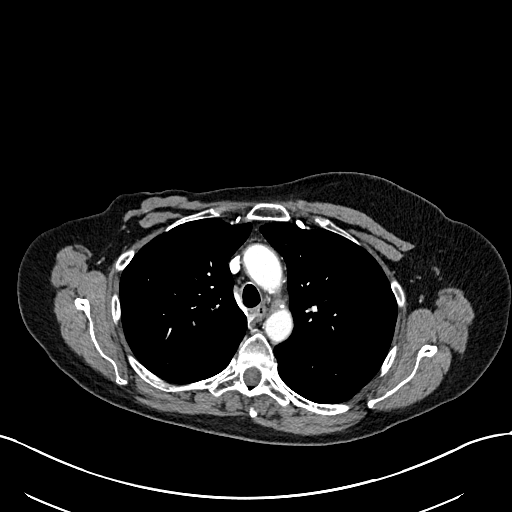
[im 118/165  lung]
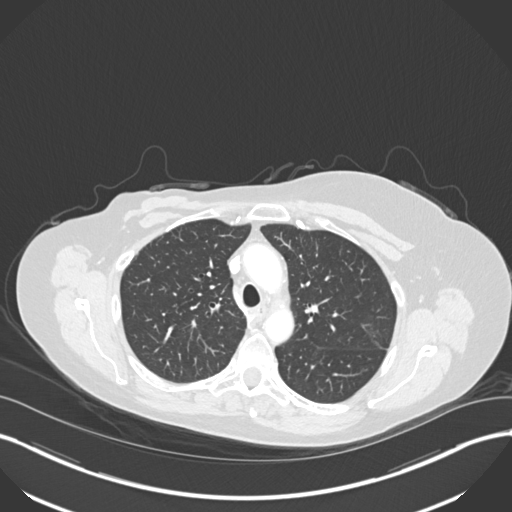
[im 129/165  lung]
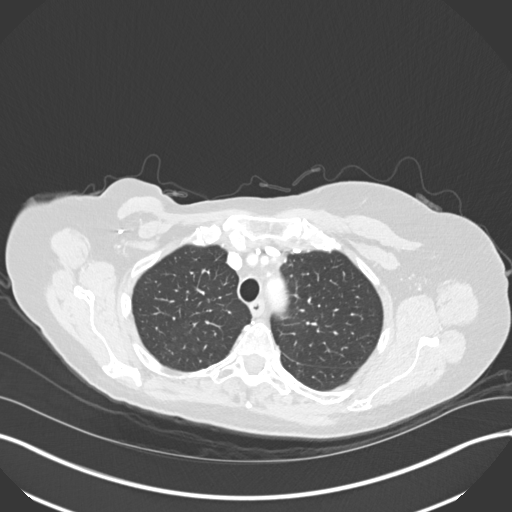
[im 141/165  lung]
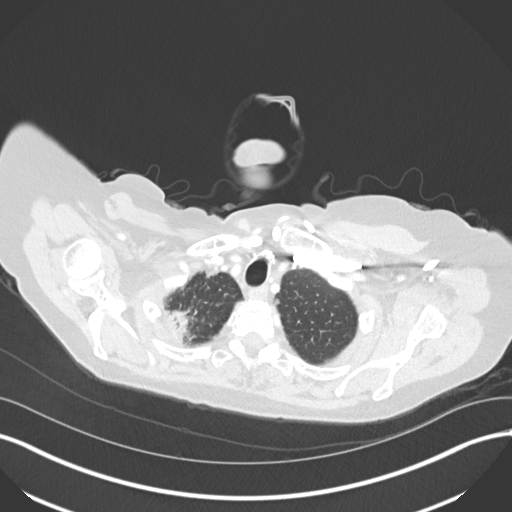
[im 153/165  lung]
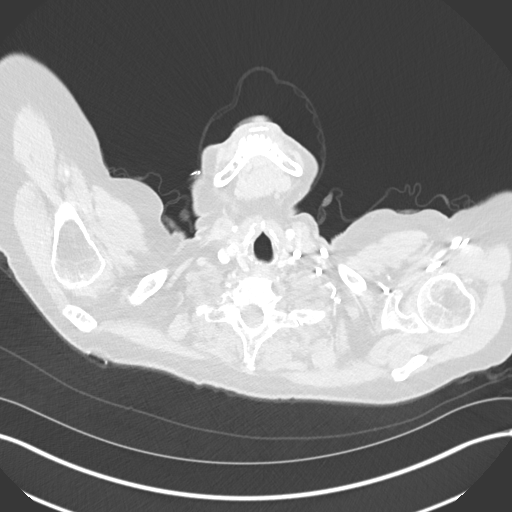

[Series 5: coronal · coronal · 0.73mm/px · 3 of 103 slices shown]
[im 21/103  lung]
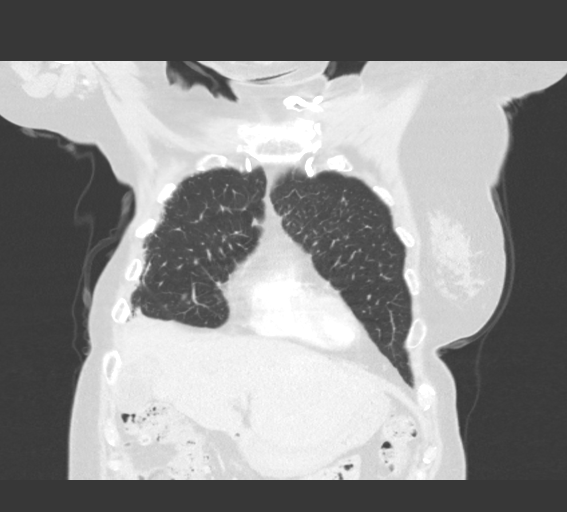
[im 41/103  lung]
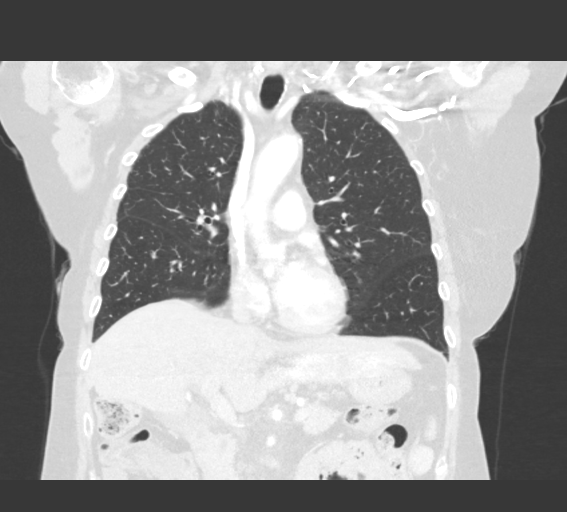
[im 62/103  lung]
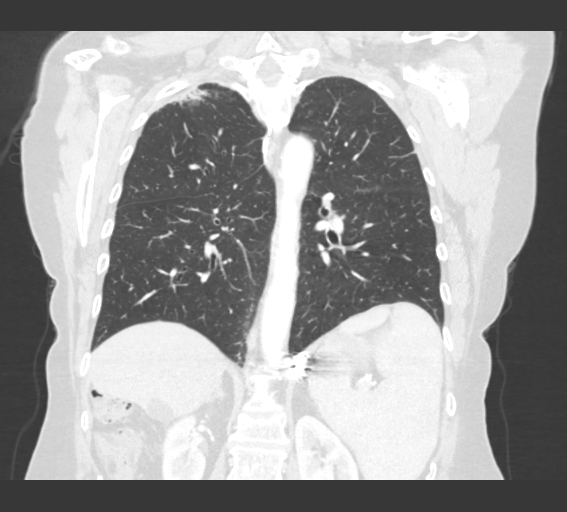

[15 of 36 positions shown; findings below may reference images not displayed]

FINDINGS: Cardiovascular: Normal heart size. Aortic atherosclerosis. There is
stenosis of the left innominate vein with resultant collateral
formation. Unchanged.

Mediastinum/Nodes: Normal appearance of the thyroid gland. The
trachea appears patent and is midline. No mediastinal or hilar
adenopathy identified. The previous index right paratracheal node
measures 6 mm, image 56/2. Previously 1 cm. Previous index left
paratracheal lymph node measures 6 mm, image 53/2. Previously 7 mm.
Previous index subcarinal lymph node measures 9 mm, image 64/2.
Unchanged

Lungs/Pleura: No pleural effusion. Subpleural fibrotic changes
within the anterolateral right lung compatible with changes from
external beam radiation.

Index nodule in the left lower lobe measures 0.9 cm, image 61/7.
Previously 0.8 cm.

Right lower lobe lung nodule measures 1.2 cm, image 70/7. Previously
1.3 cm.

1 cm subpleural nodule in the anterolateral right upper lobe is
unchanged, image 48/7

Upper Abdomen: No acute abnormality. Right lobe of liver cyst is
again noted.

Musculoskeletal: Spondylosis within the thoracic spine. No
aggressive lytic or sclerotic bone. Previous right mastectomy.
Lesions.
IMPRESSION: 1. No significant interval change in bilateral pulmonary nodules.
2. Previously referenced prominent mediastinal lymph nodes are
stable or decreased in size in the interval. No pathologically
enlarged lymph nodes identified at this time.

## 2019-08-16 ENCOUNTER — Ambulatory Visit (HOSPITAL_COMMUNITY): Payer: BC Managed Care – PPO | Admitting: Certified Registered Nurse Anesthetist

## 2019-08-16 ENCOUNTER — Other Ambulatory Visit: Payer: Self-pay

## 2019-08-16 ENCOUNTER — Encounter (HOSPITAL_COMMUNITY): Payer: Self-pay | Admitting: *Deleted

## 2019-08-16 ENCOUNTER — Ambulatory Visit (HOSPITAL_COMMUNITY)
Admission: RE | Admit: 2019-08-16 | Discharge: 2019-08-16 | Disposition: A | Discharge status: Home / Self Care | Payer: BC Managed Care – PPO | Attending: Gastroenterology | Admitting: Gastroenterology

## 2019-08-16 ENCOUNTER — Encounter (HOSPITAL_COMMUNITY)
Admission: RE | Disposition: A | Discharge status: Home / Self Care | Payer: Self-pay | Source: Home / Self Care | Attending: Gastroenterology

## 2019-08-16 DIAGNOSIS — Z79899 Other long term (current) drug therapy: Secondary | ICD-10-CM | POA: Insufficient documentation

## 2019-08-16 DIAGNOSIS — K7460 Unspecified cirrhosis of liver: Secondary | ICD-10-CM | POA: Diagnosis not present

## 2019-08-16 DIAGNOSIS — F329 Major depressive disorder, single episode, unspecified: Secondary | ICD-10-CM | POA: Diagnosis not present

## 2019-08-16 DIAGNOSIS — M1990 Unspecified osteoarthritis, unspecified site: Secondary | ICD-10-CM | POA: Insufficient documentation

## 2019-08-16 DIAGNOSIS — Z853 Personal history of malignant neoplasm of breast: Secondary | ICD-10-CM | POA: Diagnosis not present

## 2019-08-16 DIAGNOSIS — Z85118 Personal history of other malignant neoplasm of bronchus and lung: Secondary | ICD-10-CM | POA: Diagnosis not present

## 2019-08-16 DIAGNOSIS — K766 Portal hypertension: Secondary | ICD-10-CM | POA: Diagnosis not present

## 2019-08-16 DIAGNOSIS — I10 Essential (primary) hypertension: Secondary | ICD-10-CM | POA: Insufficient documentation

## 2019-08-16 DIAGNOSIS — I8500 Esophageal varices without bleeding: Secondary | ICD-10-CM | POA: Insufficient documentation

## 2019-08-16 DIAGNOSIS — Z1159 Encounter for screening for other viral diseases: Secondary | ICD-10-CM | POA: Diagnosis not present

## 2019-08-16 DIAGNOSIS — K3189 Other diseases of stomach and duodenum: Secondary | ICD-10-CM | POA: Diagnosis not present

## 2019-08-16 DIAGNOSIS — Z86718 Personal history of other venous thrombosis and embolism: Secondary | ICD-10-CM | POA: Insufficient documentation

## 2019-08-16 HISTORY — PX: ESOPHAGOGASTRODUODENOSCOPY (EGD) WITH PROPOFOL: SHX5813

## 2019-08-16 HISTORY — PX: ESOPHAGEAL BANDING: SHX5518

## 2019-08-16 SURGERY — ESOPHAGOGASTRODUODENOSCOPY (EGD) WITH PROPOFOL
Anesthesia: Monitor Anesthesia Care

## 2019-08-16 MED ORDER — PROPOFOL 10 MG/ML IV BOLUS
INTRAVENOUS | Status: DC | PRN
  Administered 2019-08-16: 50 mg via INTRAVENOUS
  Administered 2019-08-16 (×2): 20 mg via INTRAVENOUS
  Administered 2019-08-16: 30 mg via INTRAVENOUS

## 2019-08-16 MED ORDER — PROPOFOL 10 MG/ML IV BOLUS
INTRAVENOUS | Status: AC
  Filled 2019-08-16: qty 60

## 2019-08-16 MED ORDER — LACTATED RINGERS IV SOLN
INTRAVENOUS | Status: DC
  Administered 2019-08-16: 1000 mL via INTRAVENOUS

## 2019-08-16 MED ORDER — PROPOFOL 500 MG/50ML IV EMUL
INTRAVENOUS | Status: DC | PRN
  Administered 2019-08-16: 150 ug/kg/min via INTRAVENOUS

## 2019-08-16 MED ORDER — SODIUM CHLORIDE 0.9 % IV SOLN: INTRAVENOUS | Status: DC

## 2019-08-16 MED ORDER — PHENYLEPHRINE 40 MCG/ML (10ML) SYRINGE FOR IV PUSH (FOR BLOOD PRESSURE SUPPORT)
PREFILLED_SYRINGE | INTRAVENOUS | Status: DC | PRN
  Administered 2019-08-16: 80 ug via INTRAVENOUS

## 2019-08-16 MED ORDER — ONDANSETRON HCL 4 MG/2ML IJ SOLN
INTRAMUSCULAR | Status: DC | PRN
  Administered 2019-08-16: 4 mg via INTRAVENOUS

## 2019-08-16 SURGICAL SUPPLY — 15 items

## 2019-08-16 NOTE — Transfer of Care (Signed)
Immediate Anesthesia Transfer of Care Note  Patient: Nicole [name]  Procedure(s) Performed: ESOPHAGOGASTRODUODENOSCOPY (EGD) WITH PROPOFOL (N/A ) ESOPHAGEAL BANDING  Patient Location: Endoscopy Unit  Anesthesia Type:MAC  Level of Consciousness: drowsy  Airway & Oxygen Therapy: Patient Spontanous Breathing and Patient connected to nasal cannula oxygen  Post-op Assessment: Report given to RN and Post -op Vital signs reviewed and stable  Post vital signs: Reviewed and stable  Last Vitals:  Vitals Value Taken Time  BP 123/68 08/16/19 0941  Temp    Pulse 56 08/16/19 0942  Resp 13 08/16/19 0942  SpO2 97 % 08/16/19 0942  Vitals shown include unvalidated device data.  Last Pain:  Vitals:   08/16/19 0829  TempSrc: Oral  PainSc: 0-No pain         Complications: No apparent anesthesia complications

## 2019-08-16 NOTE — Interval H&P Note (Signed)
History and Physical Interval Note:  62/female with esophageal varices from portal HTN(non cirrhotic) for an EGD for therapy of varices. 08/16/2019 9:07 AM  Nicole [name]  has presented today for EGD with  banding, with the diagnosis of esophageal varices.  The various methods of treatment have been discussed with the patient and family. After consideration of risks, benefits and other options for treatment, the patient has consented to  Procedure(s): ESOPHAGOGASTRODUODENOSCOPY (EGD) WITH PROPOFOL (N/A) GASTRIC VARICES BANDING (N/A) as a surgical intervention.  The patient's history has been reviewed, patient examined, no change in status, stable for surgery.  I have reviewed the patient's chart and labs.  Questions were answered to the patient's satisfaction.     [name]

## 2019-08-16 NOTE — Brief Op Note (Signed)
08/16/2019  9:37 AM  PATIENT:  Nicole [name]  63 y.o. female  PRE-OPERATIVE DIAGNOSIS:  esophageal varices  POST-OPERATIVE DIAGNOSIS:  esophageal varices banding  PROCEDURE:  Procedure(s): ESOPHAGOGASTRODUODENOSCOPY (EGD) WITH PROPOFOL (N/A) ESOPHAGEAL BANDING  SURGEON:  Surgeon(s) and Role:    [name], MD - Primary  PHYSICIAN ASSISTANT:   ASSISTANTS: Jayme Cloud, Tech  ANESTHESIA:   MAC  EBL:  Minimal  BLOOD ADMINISTERED:none  DRAINS: none   LOCAL MEDICATIONS USED:  NONE  SPECIMEN:  No Specimen  DISPOSITION OF SPECIMEN:  N/A  COUNTS:  YES  TOURNIQUET:  * No tourniquets in log *  DICTATION: .Dragon Dictation  PLAN OF CARE: Discharge to home after PACU  PATIENT DISPOSITION:  PACU - hemodynamically stable.   Delay start of Pharmacological VTE agent (>24hrs) due to surgical blood loss or risk of bleeding: no

## 2019-08-16 NOTE — Anesthesia Postprocedure Evaluation (Signed)
Anesthesia Post Note  Patient: SULAY BRYMER  Procedure(s) Performed: ESOPHAGOGASTRODUODENOSCOPY (EGD) WITH PROPOFOL (N/A ) ESOPHAGEAL BANDING     Patient location during evaluation: Endoscopy Anesthesia Type: MAC Level of consciousness: awake and alert, oriented and patient cooperative Pain management: pain level controlled Vital Signs Assessment: post-procedure vital signs reviewed and stable Respiratory status: spontaneous breathing, nonlabored ventilation and respiratory function stable Cardiovascular status: blood pressure returned to baseline and stable Postop Assessment: no apparent nausea or vomiting Anesthetic complications: no    Last Vitals:  Vitals:   08/16/19 0829 08/16/19 0942  BP: (!) 137/91 123/68  Pulse: 69 60  Resp: 18 15  Temp: 36.6 C 36.5 C  SpO2: 99% 98%    Last Pain:  Vitals:   08/16/19 0942  TempSrc: Oral  PainSc: 0-No pain                 Bee Marchiano,E. Jaymar Loeber

## 2019-08-16 NOTE — Anesthesia Preprocedure Evaluation (Addendum)
Anesthesia Evaluation  Patient identified by MRN, date of birth, ID band Patient awake    Reviewed: Allergy & Precautions, NPO status , Patient's Chart, lab work & pertinent test results  History of Anesthesia Complications Negative for: history of anesthetic complications  Airway Mallampati: II  TM Distance: >3 FB Neck ROM: Full    Dental  (+) Dental Advisory Given, Teeth Intact   Pulmonary neg pulmonary ROS,  08/12/2019 SARS coronavirus NEG   breath sounds clear to auscultation       Cardiovascular hypertension, Pt. on home beta blockers (-) angina+ DVT   Rhythm:Regular Rate:Normal     Neuro/Psych  Headaches, Depression    GI/Hepatic negative GI ROS, (+) Cirrhosis   Esophageal Varices    ,   Endo/Other  negative endocrine ROS  Renal/GU negative Renal ROS     Musculoskeletal  (+) Arthritis , Osteoarthritis,    Abdominal   Peds  Hematology   Anesthesia Other Findings H/o breast cancer  Reproductive/Obstetrics                            Anesthesia Physical Anesthesia Plan  ASA: III  Anesthesia Plan: MAC   Post-op Pain Management:    Induction:   PONV Risk Score and Plan: 2 and Treatment may vary due to age or medical condition  Airway Management Planned: Natural Airway and Nasal Cannula  Additional Equipment:   Intra-op Plan:   Post-operative Plan:   Informed Consent: I have reviewed the patients History and Physical, chart, labs and discussed the procedure including the risks, benefits and alternatives for the proposed anesthesia with the patient or authorized representative who has indicated his/her understanding and acceptance.     Dental advisory given  Plan Discussed with: CRNA and Surgeon  Anesthesia Plan Comments:        Anesthesia Quick Evaluation

## 2019-08-16 NOTE — Discharge Instructions (Signed)
YOU HAD AN ENDOSCOPIC PROCEDURE TODAY: Refer to the procedure report and other information in the discharge instructions given to you for any specific questions about what was found during the examination. If this information does not answer your questions, please call [place] office at [phone] to clarify.   YOU SHOULD EXPECT: Some feelings of bloating in the abdomen. Passage of more gas than usual. Walking can help get rid of the air that was put into your GI tract during the procedure and reduce the bloating. If you had a lower endoscopy (such as a colonoscopy or flexible sigmoidoscopy) you may notice spotting of blood in your stool or on the toilet paper. Some abdominal soreness may be present for a day or two, also.  DIET: Your first meal following the procedure should be a light meal and then it is ok to progress to your normal diet. A half-sandwich or bowl of soup is an example of a good first meal. Heavy or fried foods are harder to digest and may make you feel nauseous or bloated. Drink plenty of fluids but you should avoid alcoholic beverages for 24 hours. If you had a esophageal dilation, please see attached instructions for diet.    ACTIVITY: Your care partner should take you home directly after the procedure. You should plan to take it easy, moving slowly for the rest of the day. You can resume normal activity the day after the procedure however YOU SHOULD NOT DRIVE, use power tools, machinery or perform tasks that involve climbing or major physical exertion for 24 hours (because of the sedation medicines used during the test).   SYMPTOMS TO REPORT IMMEDIATELY: A gastroenterologist can be reached at any hour. Please call [phone]  for any of the following symptoms:   Following upper endoscopy (EGD, EUS, ERCP, esophageal dilation) Vomiting of blood or coffee ground material  New, significant abdominal pain  New, significant chest pain or pain under the shoulder blades  Painful or  persistently difficult swallowing  New shortness of breath  Black, tarry-looking or red, bloody stools  FOLLOW UP:  If any biopsies were taken you will be contacted by phone or by letter within the next 1-3 weeks. Call [phone]  if you have not heard about the biopsies in 3 weeks.  Please also call with any specific questions about appointments or follow up tests.  

## 2019-08-16 NOTE — Op Note (Signed)
[facility] Patient Name: Nicole [name] Procedure Date: 08/16/2019 MRN: [id number] Attending MD: [name] , MD Date of Birth: May 13, 1956 CSN: [id number] Age: 63 Admit Type: Outpatient Procedure:                Upper GI endoscopy Indications:              For therapy of esophageal varices Providers:                [name], MD, Cleda Daub, RN, Elspeth Cho                            Tech., Technician, Lina Sar, Technician, Dellie Catholic Referring MD:              Medicines:                Monitored Anesthesia Care Complications:            No immediate complications. Estimated blood loss:                            Minimal. Estimated Blood Loss:     Estimated blood loss was minimal. Procedure:                Pre-Anesthesia Assessment:                           - Prior to the procedure, a History and Physical                            was performed, and patient medications and                            allergies were reviewed. The patient's tolerance of                            previous anesthesia was also reviewed. The risks                            and benefits of the procedure and the sedation                            options and risks were discussed with the patient.                            All questions were answered, and informed consent                            was obtained. Prior Anticoagulants: The patient has                            taken no previous anticoagulant or antiplatelet                            agents. ASA Grade  Assessment: III - A patient with                            severe systemic disease. After reviewing the risks                            and benefits, the patient was deemed in                            satisfactory condition to undergo the procedure.                           After obtaining informed consent, the endoscope was                            passed under direct vision.  Throughout the                            procedure, the patient's blood pressure, pulse, and                            oxygen saturations were monitored continuously. The                            GIF-H190 ([id number]) Olympus gastroscope was                            introduced through the mouth, and advanced to the                            second part of duodenum. The upper GI endoscopy was                            accomplished without difficulty. The patient                            tolerated the procedure well. Scope In: Scope Out: Findings:      Grade II varices were found in the lower third of the esophagus. They       were small in size. Four bands were successfully placed with complete       eradication, resulting in deflation of varices. There was no bleeding at       the end of the procedure.      The Z-line was regular and was found 37 cm from the incisors.      Mild portal hypertensive gastropathy was found in the stomach.      The cardia and gastric fundus were normal on retroflexion.      The examined duodenum was normal. Impression:               - Grade II esophageal varices. Completely                            eradicated. Banded.                           -  Z-line regular, 37 cm from the incisors.                           - Portal hypertensive gastropathy.                           - Normal examined duodenum.                           - No specimens collected. Moderate Sedation:      Patient did not receive moderate sedation for this procedure, but       instead received monitored anesthesia care. Recommendation:           - Patient has a contact number available for                            emergencies. The signs and symptoms of potential                            delayed complications were discussed with the                            patient. Return to normal activities tomorrow.                            Written discharge instructions were provided  to the                            patient.                           - Resume regular diet.                           - Continue present medications.                           - Repeat upper endoscopy in 1 year for retreatment. Procedure Code(s):        --- Professional ---                           [phone], Esophagogastroduodenoscopy, flexible,                            transoral; with band ligation of esophageal/gastric                            varices Diagnosis Code(s):        --- Professional ---                           I85.00, Esophageal varices without bleeding                           K76.6, Portal hypertension  K31.89, Other diseases of stomach and duodenum CPT copyright 2019 American Medical Association. All rights reserved. The codes documented in this report are preliminary and upon coder review may  be revised to meet current compliance requirements. [name], MD 08/16/2019 9:36:23 AM This report has been signed electronically. Number of Addenda: 0

## 2019-08-17 ENCOUNTER — Encounter (HOSPITAL_COMMUNITY): Payer: Self-pay | Admitting: Gastroenterology

## 2019-08-17 ENCOUNTER — Other Ambulatory Visit: Payer: Self-pay | Admitting: Oncology

## 2019-08-17 DIAGNOSIS — C50919 Malignant neoplasm of unspecified site of unspecified female breast: Secondary | ICD-10-CM

## 2019-08-20 ENCOUNTER — Other Ambulatory Visit: Payer: Self-pay | Admitting: Oncology

## 2019-08-20 DIAGNOSIS — C50411 Malignant neoplasm of upper-outer quadrant of right female breast: Secondary | ICD-10-CM

## 2019-08-25 ENCOUNTER — Encounter: Payer: Self-pay | Admitting: *Deleted

## 2019-08-25 ENCOUNTER — Ambulatory Visit
Admission: RE | Admit: 2019-08-25 | Discharge: 2019-08-25 | Disposition: A | Discharge status: Home / Self Care | Payer: BC Managed Care – PPO | Source: Ambulatory Visit | Attending: Interventional Radiology | Admitting: Interventional Radiology

## 2019-08-25 DIAGNOSIS — I864 Gastric varices: Secondary | ICD-10-CM

## 2019-08-25 HISTORY — PX: IR RADIOLOGIST EVAL & MGMT: IMG5224

## 2019-08-25 NOTE — Progress Notes (Signed)
Chief Complaint: Cirrhosis & portal HTN  Referring Physician(s): Dr. Laurence Spates  GI: Dr. [name] Hepatology: Nicole [name] PCP: Dr. Milagros Evener Hematology: Dr. Sarajane Jews Magrinat  History of Present Illness: Nicole [name] is a 63 y.o. female presenting as a scheduled follow up to [name] clinic today, SP BRTO for bleeding gastric varices performed at [facility] 04/13/2016.    We had a telemedicine visit today given the COVID situation.  Her husband was with her on the call.   She tells me she is doing fine. The last time that we saw her was about 1 year ago in August.  Since then, she has had no hospitalization for GI bleeding.    She has recently had upper endoscopy performed by Dr. Therisa Doyne, 08/16/2019.  4 EV's were banded.   She has also seen Dr. [name] recently, her oncologist, with his note reflecting her current plan of continuing suppressive therapy for metastatic breast CA, and currently no evidence of active disease.    Our last abdominal CT imaging was 1 year ago, with no concerning findings.   Past Medical History:  Diagnosis Date  . Anemia    low iron  . Arthritis   . Blood transfusion 2012  . Breast CA ([place])    breast ca dx 06/2010, stage 4, right , Lung Metasis.Surgery, Chemo, Radiation  . Clot    jugular  ?2015  . Depression   . Esophageal varices ([place])   . Headache    Mirgraine- rare since menopause  . Heart murmur    mild, no cardiologist, from birth  . Hematemesis 08/2017  . History of kidney stones    no pain- shows in CT- small 1 mm bilaterally- kidney  . IBD (inflammatory bowel disease)    resolved  . Idiopathic cirrhosis ([place])    pt denies  . Peripheral vascular disease ([place]) few yrs ago   clot in right jugular  . Portal hypertension ([place])   . Radiation 07/21/12-09/07/12   5940 cGy  . Restless legs     Past Surgical History:  Procedure Laterality Date  . COLONOSCOPY  2016  . DILATION AND CURETTAGE OF UTERUS  2004?  . ESOPHAGEAL  BANDING  09/04/2017   Procedure: ESOPHAGEAL BANDING;  Surgeon: [name], MD;  Location: [facility] ENDOSCOPY;  Service: Gastroenterology;;  . ESOPHAGEAL BANDING  11/27/2017   Procedure: ESOPHAGEAL BANDING;  Surgeon: [name], MD;  Location: [facility] ENDOSCOPY;  Service: Gastroenterology;;  . ESOPHAGEAL BANDING N/A 04/28/2018   Procedure: ESOPHAGEAL BANDING;  Surgeon: [name], MD;  Location: [facility] ENDOSCOPY;  Service: Gastroenterology;  Laterality: N/A;  . ESOPHAGEAL BANDING  11/17/2018   Procedure: ESOPHAGEAL BANDING;  Surgeon: [name], MD;  Location: [facility] ENDOSCOPY;  Service: Gastroenterology;;  . ESOPHAGEAL BANDING  08/16/2019   Procedure: ESOPHAGEAL BANDING;  Surgeon: [name], MD;  Location: [facility] ENDOSCOPY;  Service: Gastroenterology;;  . ESOPHAGOGASTRODUODENOSCOPY N/A 04/28/2018   Procedure: ESOPHAGOGASTRODUODENOSCOPY (EGD);  Surgeon: [name], MD;  Location: [facility] ENDOSCOPY;  Service: Gastroenterology;  Laterality: N/A;  . ESOPHAGOGASTRODUODENOSCOPY N/A 11/17/2018   Procedure: ESOPHAGOGASTRODUODENOSCOPY (EGD);  Surgeon: [name], MD;  Location: [facility] ENDOSCOPY;  Service: Gastroenterology;  Laterality: N/A;  . ESOPHAGOGASTRODUODENOSCOPY (EGD) WITH PROPOFOL N/A 04/11/2016   Procedure: ESOPHAGOGASTRODUODENOSCOPY (EGD) WITH PROPOFOL;  Surgeon: Clarene Essex, MD;  Location: [facility] ENDOSCOPY;  Service: Endoscopy;  Laterality: N/A;  . ESOPHAGOGASTRODUODENOSCOPY (EGD) WITH PROPOFOL N/A 07/08/2016   Procedure: ESOPHAGOGASTRODUODENOSCOPY (EGD) WITH PROPOFOL;  Surgeon: Laurence Spates, MD;  Location: [place];  Service: Endoscopy;  Laterality: N/A;  . ESOPHAGOGASTRODUODENOSCOPY (EGD) WITH PROPOFOL N/A 03/03/2017   Procedure: ESOPHAGOGASTRODUODENOSCOPY (EGD) WITH PROPOFOL;  Surgeon: Laurence Spates, MD;  Location: [place];  Service: Endoscopy;  Laterality: N/A;  . ESOPHAGOGASTRODUODENOSCOPY (EGD) WITH PROPOFOL N/A 09/04/2017   Procedure: ESOPHAGOGASTRODUODENOSCOPY (EGD) WITH PROPOFOL;  Surgeon: [name], MD;   Location: [place];  Service: Gastroenterology;  Laterality: N/A;  . ESOPHAGOGASTRODUODENOSCOPY (EGD) WITH PROPOFOL N/A 11/27/2017   Procedure: ESOPHAGOGASTRODUODENOSCOPY (EGD);  Surgeon: [name], MD;  Location: [place];  Service: Gastroenterology;  Laterality: N/A;  . ESOPHAGOGASTRODUODENOSCOPY (EGD) WITH PROPOFOL N/A 08/16/2019   Procedure: ESOPHAGOGASTRODUODENOSCOPY (EGD) WITH PROPOFOL;  Surgeon: [name], MD;  Location: [facility] ENDOSCOPY;  Service: Gastroenterology;  Laterality: N/A;  . IR GENERIC HISTORICAL  05/15/2016   IR RADIOLOGIST EVAL & MGMT 05/15/2016 Corrie Mckusick, DO [facility] INTERV RAD  . IR GENERIC HISTORICAL  01/14/2017   IR US GUIDE VASC ACCESS RIGHT 01/14/2017 Greggory Keen, MD [facility]  . IR GENERIC HISTORICAL  01/14/2017   IR VENOGRAM HEPATIC W HEMODYNAMIC EVALUATION 01/14/2017 Greggory Keen, MD [facility]  . IR RADIOLOGIST EVAL & MGMT  06/12/2017  . IR RADIOLOGIST EVAL & MGMT  08/11/2018  . IR RADIOLOGIST EVAL & MGMT  08/25/2019  . MASTECTOMY Right 06/02/12   right breast with lymph node removal  . PORT-A-CATH REMOVAL    . PORTACATH PLACEMENT  2011   left side  . RADIOLOGY WITH ANESTHESIA N/A 04/13/2016   Procedure: RADIOLOGY WITH ANESTHESIA;  Surgeon: Corrie Mckusick, DO;  Location: [place];  Service: Anesthesiology;  Laterality: N/A;    Allergies: Patient has no known allergies.  Medications: Prior to Admission medications   Medication Sig Start Date End Date Taking? Authorizing Provider  Calcium Carb-Cholecalciferol (CALCIUM 600+D3) 600-800 MG-UNIT TABS Take 1 tablet by mouth at bedtime.    [provider]  denosumab (PROLIA) 60 MG/ML SOLN injection Inject 60 mg into the skin every 6 (six) months. Administer in upper arm, thigh, or abdomen    [provider]  letrozole (FEMARA) 2.5 MG tablet TAKE 1 TABLET (2.5 MG TOTAL) BY MOUTH AT BEDTIME. 08/17/19   Magrinat, Virgie Dad, MD  Multiple Vitamin (MULTIVITAMIN WITH MINERALS) TABS tablet Take 1 tablet  by mouth daily. Centrum Silver    [provider]  propranolol (INDERAL) 20 MG tablet Take 1 tablet (20 mg total) by mouth 2 (two) times daily. 09/07/17   Modena Jansky, MD  venlafaxine XR (EFFEXOR-XR) 150 MG 24 hr capsule TAKE 1 CAPSULE (150 MG TOTAL) BY MOUTH DAILY WITH BREAKFAST. 08/20/19   Magrinat, Virgie Dad, MD     Family History  Problem Relation Age of Onset  . COPD Mother   . AAA (abdominal aortic aneurysm) Mother   . COPD Father   . Cancer Neg Hx     Social History   Socioeconomic History  . Marital status: Married    Spouse name: Not on file  . Number of children: Not on file  . Years of education: Not on file  . Highest education level: Not on file  Occupational History  . Not on file  Social Needs  . Financial resource strain: Not on file  . Food insecurity    Worry: Not on file    Inability: Not on file  . Transportation needs    Medical: Not on file    Non-medical: Not on file  Tobacco Use  . Smoking status: Never Smoker  . Smokeless tobacco: Never Used  Substance  and Sexual Activity  . Alcohol use: No  . Drug use: No  . Sexual activity: Yes  Lifestyle  . Physical activity    Days per week: Not on file    Minutes per session: Not on file  . Stress: Not on file  Relationships  . Social Herbalist on phone: Not on file    Gets together: Not on file    Attends religious service: Not on file    Active member of club or organization: Not on file    Attends meetings of clubs or organizations: Not on file    Relationship status: Not on file  Other Topics Concern  . Not on file  Social History Narrative  . Not on file    ECOG Status: 0 - Asymptomatic  Review of Systems  Review of Systems: A 12 point ROS discussed and pertinent positives are indicated in the HPI above.  All other systems are negative.  Physical Exam No direct physical exam was performed (except for noted visual exam findings with Video Visits).    Vital  Signs: There were no vitals taken for this visit.  Imaging: Ir Radiologist Eval & Mgmt  Result Date: 08/25/2019 Please refer to notes tab for details about interventional procedure. (Op Note)   Labs:  CBC: Recent Labs    12/07/18 1439 01/27/19 0936 06/03/19 1046  WBC 5.2 5.8 6.3  HGB 14.6 15.4* 15.1*  HCT 43.4 45.6 44.9  PLT 121* 100* 96*    COAGS: No results for input(s): INR, APTT in the last 8760 hours.  BMP: Recent Labs    12/07/18 1439 01/27/19 0936 06/03/19 1046  NA 141 141 142  K 4.0 4.4 4.6  CL 110 109 108  CO2 20* 24 24  GLUCOSE 99 94 93  BUN 17 13 14   CALCIUM 9.4 9.0 9.2  CREATININE 1.17* 1.27* 1.20*  GFRNONAA 50* 45* 48*  GFRAA 57* 52* 55*    LIVER FUNCTION TESTS: Recent Labs    12/07/18 1439 01/27/19 0936 06/03/19 1046  BILITOT 0.8 0.8 0.8  AST 24 23 25   ALT 24 28 26   ALKPHOS 65 65 67  PROT 7.0 7.2 7.2  ALBUMIN 3.8 3.9 4.1    TUMOR MARKERS: No results for input(s): AFPTM, CEA, CA199, CHROMGRNA in the last 8760 hours.  Assessment and Plan:  Nicole Stoyer is a 63 yo female with portal HTN previously treated with BRTO for hemorrhagic gastric varices, now over 3 years from her treatment.    She has recently had endoscopy, with banding of EV's, but has had no recurrent hospitalization for GI bleeding.    We discussed her portal HTN as the cause, and the fact that there would be nothing more to do unless her bleeding recurred.  We discussed the low yield of further surveillance CT imaging.    Our plan will be to see her in 1 year for an office visit.  No imaging will be needed.  If she needs [place] in the interval, she knows she can reach out to [place].   Plan: - Office visit in 1 year.  No imaging needed .  Electronically Signed: Corrie Mckusick 08/25/2019, 5:07 PM   I spent a total of    15 Minutes in remote  clinical consultation, greater than 50% of which was counseling/coordinating care for portal HTN, prior BRTO for bleeding gastric varices.     Visit type: Audio only (telephone). Audio (no video) only due to not available.  Alternative for in-person consultation at [facility], [address] [place], [place], [place]. This visit type was conducted due to national recommendations for restrictions regarding the COVID-19 Pandemic (e.g. social distancing).  This format is felt to be most appropriate for this patient at this time.  All issues noted in this document were discussed and addressed.

## 2019-11-27 ENCOUNTER — Other Ambulatory Visit: Payer: Self-pay | Admitting: Oncology

## 2019-11-27 DIAGNOSIS — C50919 Malignant neoplasm of unspecified site of unspecified female breast: Secondary | ICD-10-CM

## 2019-12-07 ENCOUNTER — Other Ambulatory Visit: Payer: Self-pay | Admitting: *Deleted

## 2019-12-07 NOTE — Progress Notes (Signed)
Nicole [name]  [phone] [phone]    ID: Nicole [name]   DOB: 1956/09/14  MR#: [id number]  LKG#:[id number]  Patient Care Team: Aretta Nip, MD as PCP - General (Family Medicine) Roosevelt Locks, CRNP as Nurse Practitioner (Nurse Practitioner) Rolm Bookbinder, MD as Consulting Physician (General Surgery) Laurence Spates, MD as Consulting Physician (Gastroenterology) [name], MD as Consulting Physician (Gastroenterology) Reinette Cuneo, Virgie Dad, MD as Consulting Physician (Oncology) OTHER MD:  CHIEF COMPLAINT:  Metastatic Breast Cancer (s/p right mastectomy)  CURRENT TREATMENT: Letrozole, denosumab/Prolia   Patient did not show to her 12/08/2019 visit  INTERVAL HISTORY: Nicole [name] was scheduled today for follow-up and treatment of her estrogen receptor positive stage IV breast cancer.   Since her last visit, she underwent left screening mammography with tomography at [facility] on 06/17/2019 showing: breast density category D; no evidence of malignancy.  She also underwent EGD with esophageal banding on 08/16/2019 under Dr. Therisa Doyne.   REVIEW OF SYSTEMS: Nicole [name]    BREAST CANCER HISTORY: From Dr. Collier Salina Rubin's 07/25/2010 note:  "This woman has not had any significant medical intervention for some time.  Her last mammogram was about seven years ago.  She noted a right breast mass about a year ago and did not seek immediate medical attention for this.  She ultimately, after some time, admitted this to her husband and self-referred herself for intervention.  She had a mammogram on 07/17/2010 with an ultrasound of the right breast.  This showed a lobulated mass upper right outer quadrant measuring at least 6.3 x 7.3 cm and large right axillary lymph nodes were also noted.  There was skin thickening overlying the mass.  On physical exam, this mass was about an 8 cm with skin dimpling noted. Discolored area in the skin over the mass, fullness in the  axilla.  Biopsy was recommended, which took place on 07/17/2010.  Pathology showed invasive ductal cancer involving both lymph node and breast.  The HER-2 was not amplified.  ER and PR both positive at 100% and 6% respectively.  Proliferative index was 12% involving both the lymph node and breast mass.  An MRI scan of both breasts was performed on 07/22/2010, essentially which showed a large heterogeneously enhancing mass of the right breast with washout kinetics measuring 6.4 x 4.4 x 5.0 cm.  Numerous satellite nodules were seen throughout the dominant mass.  There were several suspicious nodules in the upper central and upper inner quadrant of the right breast.  There were two enhancing subcentimeter nodules seen in the right pectoralis muscle.  No obvious chest wall nodules were seen; however, there was seen some metastatic adenopathy in the mediastinum as well as hila with a 2.4 x 2.0 cm mediastinal lymph node as well as 1.2 x 3.0 cm subcarinal lymph node.  There was a right hilar and probable left hilar adenopathy.  Bilateral pulmonary nodules were seen and a right T2 lesion was seen anterior right liver as well."  Her subsequent history is as detailed below.   PAST MEDICAL HISTORY: Past Medical History:  Diagnosis Date   Anemia    low iron   Arthritis    Blood transfusion 2012   Breast CA ([place])    breast ca dx 06/2010, stage 4, right , Lung Metasis.Surgery, Chemo, Radiation   Clot    jugular  ?2015   Depression    Esophageal varices ([place])    Headache    Mirgraine- rare since menopause   Heart  murmur    mild, no cardiologist, from birth   Hematemesis 08/2017   History of kidney stones    no pain- shows in CT- small 1 mm bilaterally- kidney   IBD (inflammatory bowel disease)    resolved   Idiopathic cirrhosis ([name])    pt denies   Peripheral vascular disease ([place]) few yrs ago   clot in right jugular   Portal hypertension ([place])    Radiation 07/21/12-09/07/12   5940 cGy     Restless legs     PAST SURGICAL HISTORY: Past Surgical History:  Procedure Laterality Date   COLONOSCOPY  2016   DILATION AND CURETTAGE OF UTERUS  2004?   ESOPHAGEAL BANDING  09/04/2017   Procedure: ESOPHAGEAL BANDING;  Surgeon: [name], MD;  Location: [facility] ENDOSCOPY;  Service: Gastroenterology;;   ESOPHAGEAL BANDING  11/27/2017   Procedure: ESOPHAGEAL BANDING;  Surgeon: [name], MD;  Location: [facility];  Service: Gastroenterology;;   ESOPHAGEAL BANDING N/A 04/28/2018   Procedure: ESOPHAGEAL BANDING;  Surgeon: [name], MD;  Location: [facility] ENDOSCOPY;  Service: Gastroenterology;  Laterality: N/A;   ESOPHAGEAL BANDING  11/17/2018   Procedure: ESOPHAGEAL BANDING;  Surgeon: [name], MD;  Location: [facility] ENDOSCOPY;  Service: Gastroenterology;;   ESOPHAGEAL BANDING  08/16/2019   Procedure: ESOPHAGEAL BANDING;  Surgeon: [name], MD;  Location: [facility] ENDOSCOPY;  Service: Gastroenterology;;   ESOPHAGOGASTRODUODENOSCOPY N/A 04/28/2018   Procedure: ESOPHAGOGASTRODUODENOSCOPY (EGD);  Surgeon: [name], MD;  Location: [facility] ENDOSCOPY;  Service: Gastroenterology;  Laterality: N/A;   ESOPHAGOGASTRODUODENOSCOPY N/A 11/17/2018   Procedure: ESOPHAGOGASTRODUODENOSCOPY (EGD);  Surgeon: [name], MD;  Location: [facility]OSCOPY;  Service: Gastroenterology;  Laterality: N/A;   ESOPHAGOGASTRODUODENOSCOPY (EGD) WITH PROPOFOL N/A 04/11/2016   Procedure: ESOPHAGOGASTRODUODENOSCOPY (EGD) WITH PROPOFOL;  Surgeon: Clarene Essex, MD;  Location: [facility] ENDOSCOPY;  Service: Endoscopy;  Laterality: N/A;   ESOPHAGOGASTRODUODENOSCOPY (EGD) WITH PROPOFOL N/A 07/08/2016   Procedure: ESOPHAGOGASTRODUODENOSCOPY (EGD) WITH PROPOFOL;  Surgeon: Laurence Spates, MD;  Location: [facility];  Service: Endoscopy;  Laterality: N/A;   ESOPHAGOGASTRODUODENOSCOPY (EGD) WITH PROPOFOL N/A 03/03/2017   Procedure: ESOPHAGOGASTRODUODENOSCOPY (EGD) WITH PROPOFOL;  Surgeon: Laurence Spates, MD;  Location: [facility];  Service:  Endoscopy;  Laterality: N/A;   ESOPHAGOGASTRODUODENOSCOPY (EGD) WITH PROPOFOL N/A 09/04/2017   Procedure: ESOPHAGOGASTRODUODENOSCOPY (EGD) WITH PROPOFOL;  Surgeon: [name], MD;  Location: [facility];  Service: Gastroenterology;  Laterality: N/A;   ESOPHAGOGASTRODUODENOSCOPY (EGD) WITH PROPOFOL N/A 11/27/2017   Procedure: ESOPHAGOGASTRODUODENOSCOPY (EGD);  Surgeon: [name], MD;  Location: [facility];  Service: Gastroenterology;  Laterality: N/A;   ESOPHAGOGASTRODUODENOSCOPY (EGD) WITH PROPOFOL N/A 08/16/2019   Procedure: ESOPHAGOGASTRODUODENOSCOPY (EGD) WITH PROPOFOL;  Surgeon: [name], MD;  Location: [facility] ENDOSCOPY;  Service: Gastroenterology;  Laterality: N/A;   IR GENERIC HISTORICAL  05/15/2016   IR RADIOLOGIST EVAL & MGMT 05/15/2016 Corrie Mckusick, DO [facility] INTERV RAD   IR GENERIC HISTORICAL  01/14/2017   IR US GUIDE VASC ACCESS RIGHT 01/14/2017 Greggory Keen, MD [facility]   IR GENERIC HISTORICAL  01/14/2017   IR VENOGRAM HEPATIC W HEMODYNAMIC EVALUATION 01/14/2017 Greggory Keen, MD [facility]   IR RADIOLOGIST EVAL & MGMT  06/12/2017   IR RADIOLOGIST EVAL & MGMT  08/11/2018   IR RADIOLOGIST EVAL & MGMT  08/25/2019   MASTECTOMY Right 06/02/12   right breast with lymph node removal   PORT-A-CATH REMOVAL     PORTACATH PLACEMENT  2011   left side   RADIOLOGY WITH ANESTHESIA N/A 04/13/2016   Procedure: RADIOLOGY WITH ANESTHESIA;  Surgeon: Corrie Mckusick, DO;  Location: [facility];  Service: Anesthesiology;  Laterality: N/A;    FAMILY HISTORY Family History  Problem Relation Age of Onset   COPD Mother    AAA (abdominal aortic aneurysm) Mother    COPD Father    Cancer Neg Hx   The patient's father died at the age of 31 from heart disease. Patient's mother died at the age of 70 status post CABG. The patient had no brothers, 2 sisters. There is no history of breast or ovarian cancer in the family.   GYNECOLOGIC HISTORY: Updated May 2018 Menarche age 39, she is GX P0. She  does not recall when she went through menopause. She never took hormone replacement.   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 They have no children and no pets. They attend a local [place].    ADVANCED DIRECTIVES: Not in place   HEALTH MAINTENANCE: (updated 03/14/2014)  Social History   Tobacco Use   Smoking status: Never Smoker   Smokeless tobacco: Never Used  Substance Use Topics   Alcohol use: No   Drug use: No     Colonoscopy: Never  PAP: Remote  Bone density: 09/27/2013, osteoporosis with a T score of -2.8  Lipid panel: Not on file    No Known Allergies  Current Outpatient Medications  Medication Sig Dispense Refill   Calcium Carb-Cholecalciferol (CALCIUM 600+D3) 600-800 MG-UNIT TABS Take 1 tablet by mouth at bedtime.     denosumab (PROLIA) 60 MG/ML SOLN injection Inject 60 mg into the skin every 6 (six) months. Administer in upper arm, thigh, or abdomen     letrozole (FEMARA) 2.5 MG tablet TAKE 1 TABLET (2.5 MG TOTAL) BY MOUTH AT BEDTIME. 90 tablet 0   Multiple Vitamin (MULTIVITAMIN WITH MINERALS) TABS tablet Take 1 tablet by mouth daily. Centrum Silver     propranolol (INDERAL) 20 MG tablet Take 1 tablet (20 mg total) by mouth 2 (two) times daily. 60 tablet 0   venlafaxine XR (EFFEXOR-XR) 150 MG 24 hr capsule TAKE 1 CAPSULE (150 MG TOTAL) BY MOUTH DAILY WITH BREAKFAST. 90 capsule 1   No current facility-administered medications for this visit.    Facility-Administered Medications Ordered in Other Visits  Medication Dose Route Frequency Provider Last Rate Last Dose   denosumab (PROLIA) injection 60 mg  60 mg Subcutaneous Once Peretz Thieme, Virgie Dad, MD        OBJECTIVE: Middle-aged white woman  There were no vitals filed for this visit.     LAB RESULTS: Lab Results  Component Value Date   WBC  3.8 05/09/2016   NEUTROABS 5.0 05/02/2016   HGB 10.9 (L) 05/09/2016   HCT 34.8 (L) 05/09/2016   MCV 92.1 05/09/2016   PLT 98 (L) 05/09/2016      Chemistry    CMP Latest Ref Rng & Units 06/03/2019 01/27/2019 12/07/2018  Glucose 70 - 99 mg/dL 93 94 99  BUN 8 - 23 mg/dL '14 13 17  ' Creatinine 0.44 - 1.00 mg/dL 1.20(H) 1.27(H) 1.17(H)  Sodium 135 - 145 mmol/L 142 141 141  Potassium 3.5 - 5.1 mmol/L 4.6 4.4 4.0  Chloride 98 - 111 mmol/L 108 109 110  CO2 22 - 32 mmol/L 24 24 20(L)  Calcium 8.9 - 10.3 mg/dL 9.2 9.0 9.4  Total Protein 6.5 - 8.1 g/dL 7.2 7.2 7.0  Total Bilirubin 0.3 - 1.2  mg/dL 0.8 0.8 0.8  Alkaline Phos 38 - 126 U/L 67 65 65  AST 15 - 41 U/L '25 23 24  ' ALT 0 - 44 U/L '26 28 24     ' STUDIES: No results found.   ASSESSMENT: 63 y.o. [place] woman presenting June 2011 with stage IV breast cancer involving the right breast upper outer quadrant, right axilla, mediastinal lymph nodes, and both lungs, but not the brain, liver, or bones  (1) positive right breast and right axillary lymph node biopsies 07/17/2010 of a clinical T4 N2 M1 invasive ductal carcinoma, grade 1, estrogen receptor 100% positive, progesterone receptor 6% positive, with an MIB-1 of 12%, and no HER-2 amplification.  (2) participated in Phase II tessetaxel study, receiving 2 cycles complicated by thrombocytopenia, anemia requiring transfusion, transaminase elevation, and afebrile neutropenia. Off-study as of September 2011. Chest CT scan September 2011 did show evidence of response.  (3) on letrozole as of October 2011, with continuing response and good tolerance   (4) 06/02/2012 underwent right mastectomy and axillary lymph node sampling (3 lymph nodes removed, all with viable cancer as well as evidence of treatment effect) for a ypT2 ypN1-2 invasive ductal carcinoma, grade 1, 98% estrogen receptor positive, progesterone receptor negative, with no HER-2 amplification  (5) left jugular vein DVT documented  06/16/2012; left sided Port-A-Cath removed mid July 2015; Coumadin stopped 09/07/2014.  (a) Left brachiocephalic v. slightly narrowed, with some Left lower neck collateralization noted on chest CT December 2015  (6) osteoporosis, was on alendronate, switched to denosumab/Prolia 11/07/2016  (7) portal hypertension, with splenomegaly, gastric varices, and intestinal edema  (a)  mild persistent leukopenia and thrombocytopenia likely due to splenomegaly  (b) iron deficiency secondary to above, status post Feraheme x2 December 2018  (8) restaging studies:  (a) chest CT 12/26/2017 shows a 4.5 mm left lower lobe pulmonary nodule which is unchanged.  There was no other evidence of active disease   PLAN:  Nicole [name] did not show for her 12/08/2019 visit.  A reminder letter has been sent  Sarajane Jews C. Kaysen Deal, MD 12/08/19 6:57 PM Medical Oncology and Hematology [facility] [place], [place] [address] Tel. [phone]    Fax. [phone]   I, Wilburn Mylar, am acting as scribe for Dr. Virgie Dad. Kyoko Elsea.  I, Lurline Del MD, have reviewed the above documentation for accuracy and completeness, and I agree with the above.

## 2019-12-08 ENCOUNTER — Inpatient Hospital Stay: Payer: Medicare Other | Attending: Oncology

## 2019-12-08 ENCOUNTER — Encounter: Payer: Self-pay | Admitting: Oncology

## 2019-12-08 ENCOUNTER — Inpatient Hospital Stay: Payer: Medicare Other

## 2019-12-08 ENCOUNTER — Inpatient Hospital Stay (HOSPITAL_BASED_OUTPATIENT_CLINIC_OR_DEPARTMENT_OTHER): Payer: Medicare Other | Admitting: Oncology

## 2019-12-08 DIAGNOSIS — Z170 Estrogen receptor positive status [ER+]: Secondary | ICD-10-CM

## 2019-12-08 DIAGNOSIS — C50919 Malignant neoplasm of unspecified site of unspecified female breast: Secondary | ICD-10-CM

## 2019-12-08 DIAGNOSIS — C50411 Malignant neoplasm of upper-outer quadrant of right female breast: Secondary | ICD-10-CM

## 2019-12-08 DIAGNOSIS — C771 Secondary and unspecified malignant neoplasm of intrathoracic lymph nodes: Secondary | ICD-10-CM | POA: Insufficient documentation

## 2019-12-08 DIAGNOSIS — Z79899 Other long term (current) drug therapy: Secondary | ICD-10-CM | POA: Insufficient documentation

## 2019-12-08 DIAGNOSIS — Z86718 Personal history of other venous thrombosis and embolism: Secondary | ICD-10-CM | POA: Insufficient documentation

## 2019-12-08 DIAGNOSIS — C7800 Secondary malignant neoplasm of unspecified lung: Secondary | ICD-10-CM

## 2019-12-08 DIAGNOSIS — Z9011 Acquired absence of right breast and nipple: Secondary | ICD-10-CM | POA: Insufficient documentation

## 2019-12-08 DIAGNOSIS — M810 Age-related osteoporosis without current pathological fracture: Secondary | ICD-10-CM | POA: Insufficient documentation

## 2019-12-08 DIAGNOSIS — C7802 Secondary malignant neoplasm of left lung: Secondary | ICD-10-CM | POA: Insufficient documentation

## 2019-12-08 DIAGNOSIS — C7801 Secondary malignant neoplasm of right lung: Secondary | ICD-10-CM | POA: Insufficient documentation

## 2019-12-08 DIAGNOSIS — Z79811 Long term (current) use of aromatase inhibitors: Secondary | ICD-10-CM | POA: Insufficient documentation

## 2019-12-10 ENCOUNTER — Telehealth: Payer: Self-pay | Admitting: Oncology

## 2019-12-10 NOTE — Telephone Encounter (Signed)
Scheduled appt per 12/11 sch message - pt aware of apt date and time

## 2019-12-13 ENCOUNTER — Inpatient Hospital Stay: Payer: Medicare Other

## 2019-12-13 ENCOUNTER — Inpatient Hospital Stay (HOSPITAL_BASED_OUTPATIENT_CLINIC_OR_DEPARTMENT_OTHER): Payer: Medicare Other | Admitting: Adult Health

## 2019-12-13 ENCOUNTER — Other Ambulatory Visit: Payer: Self-pay

## 2019-12-13 VITALS — BP 116/65 | HR 79 | Resp 18 | Ht 64.0 in | Wt 158.8 lb

## 2019-12-13 DIAGNOSIS — M810 Age-related osteoporosis without current pathological fracture: Secondary | ICD-10-CM

## 2019-12-13 DIAGNOSIS — C7800 Secondary malignant neoplasm of unspecified lung: Secondary | ICD-10-CM | POA: Diagnosis not present

## 2019-12-13 DIAGNOSIS — Z86718 Personal history of other venous thrombosis and embolism: Secondary | ICD-10-CM | POA: Diagnosis not present

## 2019-12-13 DIAGNOSIS — C771 Secondary and unspecified malignant neoplasm of intrathoracic lymph nodes: Secondary | ICD-10-CM | POA: Diagnosis not present

## 2019-12-13 DIAGNOSIS — C7801 Secondary malignant neoplasm of right lung: Secondary | ICD-10-CM | POA: Diagnosis not present

## 2019-12-13 DIAGNOSIS — C50919 Malignant neoplasm of unspecified site of unspecified female breast: Secondary | ICD-10-CM | POA: Diagnosis not present

## 2019-12-13 DIAGNOSIS — C7802 Secondary malignant neoplasm of left lung: Secondary | ICD-10-CM | POA: Diagnosis not present

## 2019-12-13 DIAGNOSIS — C50411 Malignant neoplasm of upper-outer quadrant of right female breast: Secondary | ICD-10-CM

## 2019-12-13 DIAGNOSIS — Z170 Estrogen receptor positive status [ER+]: Secondary | ICD-10-CM | POA: Diagnosis not present

## 2019-12-13 DIAGNOSIS — Z79811 Long term (current) use of aromatase inhibitors: Secondary | ICD-10-CM | POA: Diagnosis not present

## 2019-12-13 DIAGNOSIS — Z9011 Acquired absence of right breast and nipple: Secondary | ICD-10-CM | POA: Diagnosis not present

## 2019-12-13 DIAGNOSIS — C50911 Malignant neoplasm of unspecified site of right female breast: Secondary | ICD-10-CM

## 2019-12-13 DIAGNOSIS — Z79899 Other long term (current) drug therapy: Secondary | ICD-10-CM | POA: Diagnosis not present

## 2019-12-13 LAB — CBC WITH DIFFERENTIAL (CANCER CENTER ONLY)
Abs Immature Granulocytes: 0.05 10*3/uL (ref 0.00–0.07)
Basophils Absolute: 0 10*3/uL (ref 0.0–0.1)
Basophils Relative: 1 %
Eosinophils Absolute: 0.1 10*3/uL (ref 0.0–0.5)
Eosinophils Relative: 2 %
HCT: 43.6 % (ref 36.0–46.0)
Hemoglobin: 14.7 g/dL (ref 12.0–15.0)
Immature Granulocytes: 1 %
Lymphocytes Relative: 13 %
Lymphs Abs: 0.8 10*3/uL (ref 0.7–4.0)
MCH: 29.8 pg (ref 26.0–34.0)
MCHC: 33.7 g/dL (ref 30.0–36.0)
MCV: 88.4 fL (ref 80.0–100.0)
Monocytes Absolute: 0.5 10*3/uL (ref 0.1–1.0)
Monocytes Relative: 8 %
Neutro Abs: 4.6 10*3/uL (ref 1.7–7.7)
Neutrophils Relative %: 75 %
Platelet Count: 94 10*3/uL — ABNORMAL LOW (ref 150–400)
RBC: 4.93 MIL/uL (ref 3.87–5.11)
RDW: 14 % (ref 11.5–15.5)
WBC Count: 6.1 10*3/uL (ref 4.0–10.5)
nRBC: 0 % (ref 0.0–0.2)

## 2019-12-13 LAB — CMP (CANCER CENTER ONLY)
ALT: 24 U/L (ref 0–44)
AST: 25 U/L (ref 15–41)
Albumin: 4 g/dL (ref 3.5–5.0)
Alkaline Phosphatase: 66 U/L (ref 38–126)
Anion gap: 12 (ref 5–15)
BUN: 20 mg/dL (ref 8–23)
CO2: 21 mmol/L — ABNORMAL LOW (ref 22–32)
Calcium: 8.7 mg/dL — ABNORMAL LOW (ref 8.9–10.3)
Chloride: 108 mmol/L (ref 98–111)
Creatinine: 1.2 mg/dL — ABNORMAL HIGH (ref 0.44–1.00)
GFR, Est AFR Am: 55 mL/min — ABNORMAL LOW (ref >60)
GFR, Estimated: 48 mL/min — ABNORMAL LOW (ref >60)
Glucose, Bld: 105 mg/dL — ABNORMAL HIGH (ref 70–99)
Potassium: 4.3 mmol/L (ref 3.5–5.1)
Sodium: 141 mmol/L (ref 135–145)
Total Bilirubin: 0.8 mg/dL (ref 0.3–1.2)
Total Protein: 7 g/dL (ref 6.5–8.1)

## 2019-12-13 MED ORDER — DENOSUMAB 60 MG/ML ~~LOC~~ SOSY
60.0000 mg | PREFILLED_SYRINGE | Freq: Once | SUBCUTANEOUS | Status: AC
  Administered 2019-12-13: 60 mg via SUBCUTANEOUS

## 2019-12-13 MED ORDER — DENOSUMAB 60 MG/ML ~~LOC~~ SOSY
PREFILLED_SYRINGE | SUBCUTANEOUS | Status: AC
  Filled 2019-12-13: qty 1

## 2019-12-13 NOTE — Progress Notes (Signed)
Nicole [name]  [phone] [phone]    ID: Nicole [name]   DOB: [birth year]  MR#: [id number]  ONG#:[id number]  Patient Care Team: Aretta Nip, MD as PCP - General (Family Medicine) Roosevelt Locks, CRNP as Nurse Practitioner (Nurse Practitioner) Rolm Bookbinder, MD as Consulting Physician (General Surgery) Laurence Spates, MD as Consulting Physician (Gastroenterology) [name], MD as Consulting Physician (Gastroenterology) Magrinat, Virgie Dad, MD as Consulting Physician (Oncology) OTHER MD:  CHIEF COMPLAINT:  Metastatic Breast Cancer (s/p right mastectomy)  CURRENT TREATMENT: Letrozole, denosumab/Prolia    INTERVAL HISTORY: Nicole [name] was scheduled today for follow-up and treatment of her estrogen receptor positive stage IV breast cancer.   Since her last visit, she underwent left screening mammography with tomography at [facility] on 06/17/2019 showing: breast density category D; no evidence of malignancy.  She was told by her niece who is in [place] school that she should have genetic testing.  She wants to know if this is true.    Her most recent chest CT from 05/2019 showed no change in pulmonary nodules.  Nicole [name] is taking Letrozole daily and is tolerating this well.  She has no hot flashes, vaginal dryness, and joint aches and pains.  Her most recent bone density was completed in 10/2018 and consistent with osteopenia with a T score of -2.2 in the left femur.  She receives Prolia every 6 months for this.    REVIEW OF SYSTEMS: Nicole [name] is not exercising.  She has no particular reason for this.  She has worked on some dietary changes.  She denies any new changes such as fever, chills, chest pain, palpitations, cough, shortness of breath, headaches vision changes, nausea, vomiting, dysphagia, breast changes/chest wall changes or any other concerns.    Nicole [name] is following appropriate pandemic precautions.  She is staying at home with her husband as  they are both retired.    A detailed ROS was otherwise non contributory today.   BREAST CANCER HISTORY: From Dr. Collier Salina Rubin's 07/25/2010 note:  "This woman has not had any significant medical intervention for some time.  Her last mammogram was about seven years ago.  She noted a right breast mass about a year ago and did not seek immediate medical attention for this.  She ultimately, after some time, admitted this to her husband and self-referred herself for intervention.  She had a mammogram on 07/17/2010 with an ultrasound of the right breast.  This showed a lobulated mass upper right outer quadrant measuring at least 6.3 x 7.3 cm and large right axillary lymph nodes were also noted.  There was skin thickening overlying the mass.  On physical exam, this mass was about an 8 cm with skin dimpling noted. Discolored area in the skin over the mass, fullness in the axilla.  Biopsy was recommended, which took place on 07/17/2010.  Pathology showed invasive ductal cancer involving both lymph node and breast.  The HER-2 was not amplified.  ER and PR both positive at 100% and 6% respectively.  Proliferative index was 12% involving both the lymph node and breast mass.  An MRI scan of both breasts was performed on 07/22/2010, essentially which showed a large heterogeneously enhancing mass of the right breast with washout kinetics measuring 6.4 x 4.4 x 5.0 cm.  Numerous satellite nodules were seen throughout the dominant mass.  There were several suspicious nodules in the upper central and upper inner quadrant of the right breast.  There were two enhancing subcentimeter nodules seen  in the right pectoralis muscle.  No obvious chest wall nodules were seen; however, there was seen some metastatic adenopathy in the mediastinum as well as hila with a 2.4 x 2.0 cm mediastinal lymph node as well as 1.2 x 3.0 cm subcarinal lymph node.  There was a right hilar and probable left hilar adenopathy.  Bilateral pulmonary nodules  were seen and a right T2 lesion was seen anterior right liver as well."  Her subsequent history is as detailed below.   PAST MEDICAL HISTORY: Past Medical History:  Diagnosis Date  . Anemia    low iron  . Arthritis   . Blood transfusion 2012  . Breast CA ([place])    breast ca dx 06/2010, stage 4, right , Lung Metasis.Surgery, Chemo, Radiation  . Clot    jugular  ?2015  . Depression   . Esophageal varices ([place])   . Headache    Mirgraine- rare since menopause  . Heart murmur    mild, no cardiologist, from birth  . Hematemesis 08/2017  . History of kidney stones    no pain- shows in CT- small 1 mm bilaterally- kidney  . IBD (inflammatory bowel disease)    resolved  . Idiopathic cirrhosis ([place])    pt denies  . Peripheral vascular disease ([place]) few yrs ago   clot in right jugular  . Portal hypertension ([place])   . Radiation 07/21/12-09/07/12   5940 cGy  . Restless legs     PAST SURGICAL HISTORY: Past Surgical History:  Procedure Laterality Date  . COLONOSCOPY  2016  . DILATION AND CURETTAGE OF UTERUS  2004?  . ESOPHAGEAL BANDING  09/04/2017   Procedure: ESOPHAGEAL BANDING;  Surgeon: [name], MD;  Location: [facility] ENDOSCOPY;  Service: Gastroenterology;;  . ESOPHAGEAL BANDING  11/27/2017   Procedure: ESOPHAGEAL BANDING;  Surgeon: [name], MD;  Location: [facility] ENDOSCOPY;  Service: Gastroenterology;;  . ESOPHAGEAL BANDING N/A 04/28/2018   Procedure: ESOPHAGEAL BANDING;  Surgeon: [name], MD;  Location: [facility] ENDOSCOPY;  Service: Gastroenterology;  Laterality: N/A;  . ESOPHAGEAL BANDING  11/17/2018   Procedure: ESOPHAGEAL BANDING;  Surgeon: [name], MD;  Location: [facility] ENDOSCOPY;  Service: Gastroenterology;;  . ESOPHAGEAL BANDING  08/16/2019   Procedure: ESOPHAGEAL BANDING;  Surgeon: [name], MD;  Location: [facility] ENDOSCOPY;  Service: Gastroenterology;;  . ESOPHAGOGASTRODUODENOSCOPY N/A 04/28/2018   Procedure: ESOPHAGOGASTRODUODENOSCOPY (EGD);  Surgeon: [name], MD;  Location: [facility]  ENDOSCOPY;  Service: Gastroenterology;  Laterality: N/A;  . ESOPHAGOGASTRODUODENOSCOPY N/A 11/17/2018   Procedure: ESOPHAGOGASTRODUODENOSCOPY (EGD);  Surgeon: [name], MD;  Location: [facility] ENDOSCOPY;  Service: Gastroenterology;  Laterality: N/A;  . ESOPHAGOGASTRODUODENOSCOPY (EGD) WITH PROPOFOL N/A 04/11/2016   Procedure: ESOPHAGOGASTRODUODENOSCOPY (EGD) WITH PROPOFOL;  Surgeon: Clarene Essex, MD;  Location: [facility] ENDOSCOPY;  Service: Endoscopy;  Laterality: N/A;  . ESOPHAGOGASTRODUODENOSCOPY (EGD) WITH PROPOFOL N/A 07/08/2016   Procedure: ESOPHAGOGASTRODUODENOSCOPY (EGD) WITH PROPOFOL;  Surgeon: Laurence Spates, MD;  Location: [facility];  Service: Endoscopy;  Laterality: N/A;  . ESOPHAGOGASTRODUODENOSCOPY (EGD) WITH PROPOFOL N/A 03/03/2017   Procedure: ESOPHAGOGASTRODUODENOSCOPY (EGD) WITH PROPOFOL;  Surgeon: Laurence Spates, MD;  Location: [facility];  Service: Endoscopy;  Laterality: N/A;  . ESOPHAGOGASTRODUODENOSCOPY (EGD) WITH PROPOFOL N/A 09/04/2017   Procedure: ESOPHAGOGASTRODUODENOSCOPY (EGD) WITH PROPOFOL;  Surgeon: [name], MD;  Location: [facility];  Service: Gastroenterology;  Laterality: N/A;  . ESOPHAGOGASTRODUODENOSCOPY (EGD) WITH PROPOFOL N/A 11/27/2017   Procedure: ESOPHAGOGASTRODUODENOSCOPY (EGD);  Surgeon: [name], MD;  Location: [facility];  Service: Gastroenterology;  Laterality: N/A;  . ESOPHAGOGASTRODUODENOSCOPY (EGD) WITH PROPOFOL  N/A 08/16/2019   Procedure: ESOPHAGOGASTRODUODENOSCOPY (EGD) WITH PROPOFOL;  Surgeon: [name], MD;  Location: [facility] ENDOSCOPY;  Service: Gastroenterology;  Laterality: N/A;  . IR GENERIC HISTORICAL  05/15/2016   IR RADIOLOGIST EVAL & MGMT 05/15/2016 Corrie Mckusick, DO [facility] INTERV RAD  . IR GENERIC HISTORICAL  01/14/2017   IR US GUIDE VASC ACCESS RIGHT 01/14/2017 Greggory Keen, MD [facility]  . IR GENERIC HISTORICAL  01/14/2017   IR VENOGRAM HEPATIC W HEMODYNAMIC EVALUATION 01/14/2017 Greggory Keen, MD [facility]  . IR RADIOLOGIST EVAL & MGMT   06/12/2017  . IR RADIOLOGIST EVAL & MGMT  08/11/2018  . IR RADIOLOGIST EVAL & MGMT  08/25/2019  . MASTECTOMY Right 06/02/12   right breast with lymph node removal  . PORT-A-CATH REMOVAL    . PORTACATH PLACEMENT  2011   left side  . RADIOLOGY WITH ANESTHESIA N/A 04/13/2016   Procedure: RADIOLOGY WITH ANESTHESIA;  Surgeon: Corrie Mckusick, DO;  Location: [facility];  Service: Anesthesiology;  Laterality: N/A;    FAMILY HISTORY Family History  Problem Relation Age of Onset  . COPD Mother   . AAA (abdominal aortic aneurysm) Mother   . COPD Father   . Cancer Neg Hx   The patient's father died at the age of 55 from heart disease. Patient's mother died at the age of 63 status post CABG. The patient had no brothers, 2 sisters. There is no history of breast or ovarian cancer in the family.   GYNECOLOGIC HISTORY: Updated May 2018 Menarche age 32, she is GX P0. She does not recall when she went through menopause. She never took hormone replacement.   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 She retired in November 2017 Husband Merry Proud is retired from [employer]. They have no children and no pets. They attend a local [employer].    ADVANCED DIRECTIVES: Not in place   HEALTH MAINTENANCE: (updated 03/14/2014)  Social History   Tobacco Use  . Smoking status: Never Smoker  . Smokeless tobacco: Never Used  Substance Use Topics  . Alcohol use: No  . Drug use: No     Colonoscopy: Never  PAP: Remote  Bone density: 09/27/2013, osteoporosis with a T score of -2.8  Lipid panel: Not on file    No Known Allergies  Current Outpatient Medications  Medication Sig Dispense Refill  . Calcium Carb-Cholecalciferol (CALCIUM 600+D3) 600-800 MG-UNIT TABS Take 1 tablet by mouth at bedtime.    [name] denosumab (PROLIA) 60 MG/ML SOLN injection Inject 60 mg into the skin every 6 (six) months. Administer in upper arm, thigh, or abdomen    .  letrozole (FEMARA) 2.5 MG tablet TAKE 1 TABLET (2.5 MG TOTAL) BY MOUTH AT BEDTIME. 90 tablet 0  . Multiple Vitamin (MULTIVITAMIN WITH MINERALS) TABS tablet Take 1 tablet by mouth daily. Centrum Silver    . propranolol (INDERAL) 20 MG tablet Take 1 tablet (20 mg total) by mouth 2 (two) times daily. 60 tablet 0  . venlafaxine XR (EFFEXOR-XR) 150 MG 24 hr capsule TAKE 1 CAPSULE (150 MG TOTAL) BY MOUTH DAILY WITH BREAKFAST. 90 capsule 1   No current facility-administered medications for this visit.   Facility-Administered Medications Ordered in Other Visits  Medication Dose Route Frequency Provider Last Rate Last Admin  . denosumab (PROLIA) injection 60 mg  60 mg Subcutaneous Once Magrinat, Virgie Dad, MD        OBJECTIVE: Middle-aged white  woman  Vitals:   12/13/19 1211  BP: 116/65  Pulse: 79  Resp: 18  TempSrc: Temporal  SpO2: 98%  Weight: 158 lb 12.8 oz (72 kg)  Height: _0  (1.626 m)   GENERAL: Patient is a well appearing female in no acute distress HEENT:  Sclerae anicteric.  Oropharynx clear and moist. No ulcerations or evidence of oropharyngeal candidiasis. Neck is supple.  NODES:  No cervical, supraclavicular, or axillary lymphadenopathy palpated.  BREAST EXAM:  Right breast s/p mastectomy, talengectasia noted, left breast benign LUNGS:  Clear to auscultation bilaterally.  No wheezes or rhonchi. HEART:  Regular rate and rhythm. No murmur appreciated. ABDOMEN:  Soft, nontender.  Positive, normoactive bowel sounds. No organomegaly palpated. MSK:  No focal spinal tenderness to palpation. Full range of motion bilaterally in the upper extremities. EXTREMITIES:  No peripheral edema.   SKIN:  Clear with no obvious rashes or skin changes. No nail dyscrasia. NEURO:  Nonfocal. Well oriented.  Appropriate affect.      LAB RESULTS: Lab Results  Component Value Date   WBC 3.8 05/09/2016   NEUTROABS 5.0 05/02/2016   HGB 10.9 (L) 05/09/2016   HCT 34.8 (L) 05/09/2016   MCV 92.1  05/09/2016   PLT 98 (L) 05/09/2016      Chemistry    CMP Latest Ref Rng & Units 12/13/2019 06/03/2019 01/27/2019  Glucose 70 - 99 mg/dL 105(H) 93 94  BUN 8 - 23 mg/dL _1 Creatinine 0.44 - 1.00 mg/dL 1.20(H) 1.20(H) 1.27(H)  Sodium 135 - 145 mmol/L 141 142 141  Potassium 3.5 - 5.1 mmol/L 4.3 4.6 4.4  Chloride 98 - 111 mmol/L 108 108 109  CO2 22 - 32 mmol/L 21(L) 24 24  Calcium 8.9 - 10.3 mg/dL 8.7(L) 9.2 9.0  Total Protein 6.5 - 8.1 g/dL 7.0 7.2 7.2  Total Bilirubin 0.3 - 1.2 mg/dL 0.8 0.8 0.8  Alkaline Phos 38 - 126 U/L 66 67 65  AST 15 - 41 U/L _2 ALT 0 - 44 U/L _3 STUDIES: No results found.   ASSESSMENT: 63 y.o. [place] woman presenting June 2011 with stage IV breast cancer involving the right breast upper outer quadrant, right axilla, mediastinal lymph nodes, and both lungs, but not the brain, liver, or bones  (1) positive right breast and right axillary lymph node biopsies 07/17/2010 of a clinical T4 N2 M1 invasive ductal carcinoma, grade 1, estrogen receptor 100% positive, progesterone receptor 6% positive, with an MIB-1 of 12%, and no HER-2 amplification.  (2) participated in Phase II tessetaxel study, receiving 2 cycles complicated by thrombocytopenia, anemia requiring transfusion, transaminase elevation, and afebrile neutropenia. Off-study as of September 2011. Chest CT scan September 2011 did show evidence of response.  (3) on letrozole as of October 2011, with continuing response and good tolerance   (4) 06/02/2012 underwent right mastectomy and axillary lymph node sampling (3 lymph nodes removed, all with viable cancer as well as evidence of treatment effect) for a ypT2 ypN1-2 invasive ductal carcinoma, grade 1, 98% estrogen receptor positive, progesterone receptor negative, with no HER-2 amplification  (5) left jugular vein DVT documented 06/16/2012; left sided Port-A-Cath removed mid July 2015; Coumadin stopped 09/07/2014.  (a) Left  brachiocephalic v. slightly narrowed, with some Left lower neck collateralization noted on chest CT December 2015  (6) osteoporosis, was on alendronate, switched to denosumab/Prolia 11/07/2016  (a) bone density in 10/2018 shows osteopenia T score of -2.2  (7) portal hypertension,  with splenomegaly, gastric varices, and intestinal edema  (a)  mild persistent leukopenia and thrombocytopenia likely due to splenomegaly  (b) iron deficiency secondary to above, status post Feraheme x2 December 2018  (8) restaging studies:  (a) chest CT 12/26/2017 shows a 4.5 mm left lower lobe pulmonary nodule which is unchanged.  There was no other evidence of active disease  (b) restaging chest CT in 05/2019 shows no change in bilateral pulmonary nodules, prominent mediastinal lymph nodes are stable   PLAN:   Hollace is doing well today.  She is without any clinical or radiographic evidence of breast cancer progression, which is great news.  She continues on Letrozole with good tolerance and will continue taking this daily.  We reviewed Dierra's labs which are stable.  Her calcium is slightly low, so I recommended she take an extra calcium pill tonight as the prolia can lower her calcium.  She understands this.    Lory and I talked about her imaging.  She is 9 years out from her metastatic breast cancer diagnosis.  Considering the stability of her scans every 6 months, I think its reasonable to extend her imaging out to annually.  She will repeat CT chest in May of next year and then see Dr. Jana Hakim in June.    For her osteopenia she is receiving Prolia every 6 months.  We reviewed bone health today including calcium, vitamin d, and weight bearing exercises.    Juliann Pulse and I discussed healthy diet and exercise today.  Rhylie will return in 6 months for labs, f/u, and her next Prolia injection.  She was recommended to continue with the appropriate pandemic precautions. She knows to call for any questions that may  arise between now and her next appointment.  We are happy to see her sooner if needed.  I reviewed the above with Dr. Jana Hakim in detail who is in agreement with the plan.    A total of (15) minutes of face-to-face time was spent with this patient with greater than 50% of that time in counseling and care-coordination.   Wilber Bihari, NP 12/13/19 1:10 PM Medical Oncology and Hematology [facility] [place], [place] [address] Tel. [phone]    Fax. [phone]

## 2019-12-13 NOTE — Patient Instructions (Signed)

## 2019-12-13 NOTE — Progress Notes (Signed)
Calcium is 8.7 today. Dr. Jana Hakim ok  with her receiving  her Prolia injection at this time.

## 2019-12-14 ENCOUNTER — Telehealth: Payer: Self-pay | Admitting: Oncology

## 2019-12-14 LAB — CANCER ANTIGEN 27.29: CA 27.29: 26.9 U/mL (ref 0.0–38.6)

## 2019-12-14 NOTE — Telephone Encounter (Signed)
I talk with patient regarding schedule  

## 2020-02-07 ENCOUNTER — Ambulatory Visit: Payer: Medicare Other

## 2020-02-11 ENCOUNTER — Ambulatory Visit: Payer: Medicare Other | Attending: Internal Medicine

## 2020-02-11 DIAGNOSIS — Z23 Encounter for immunization: Secondary | ICD-10-CM

## 2020-02-11 NOTE — Progress Notes (Signed)
   Covid-19 Vaccination Clinic  Name:  Nicole [name]    MRN: [id number] DOB: 10/25/1956  02/11/2020  Nicole [name] was observed post Covid-19 immunization for 15 minutes without incidence. She was provided with Vaccine Information Sheet and instruction to access the V-Safe system.   Nicole [name] was instructed to call 911 with any severe reactions post vaccine: [name] Difficulty breathing  . Swelling of your face and throat  . A fast heartbeat  . A bad rash all over your body  . Dizziness and weakness    Immunizations Administered    Name Date Dose VIS Date Route   Pfizer COVID-19 Vaccine 02/11/2020  3:10 PM 0.3 mL 12/10/2019 Intramuscular   Manufacturer: [place]   Lot: [id number]   [place]: [id number]

## 2020-02-21 ENCOUNTER — Other Ambulatory Visit: Payer: Self-pay | Admitting: Oncology

## 2020-02-21 DIAGNOSIS — C7800 Secondary malignant neoplasm of unspecified lung: Secondary | ICD-10-CM

## 2020-02-21 DIAGNOSIS — C50919 Malignant neoplasm of unspecified site of unspecified female breast: Secondary | ICD-10-CM

## 2020-02-21 DIAGNOSIS — C50411 Malignant neoplasm of upper-outer quadrant of right female breast: Secondary | ICD-10-CM

## 2020-03-05 ENCOUNTER — Ambulatory Visit: Payer: Medicare Other | Attending: Internal Medicine

## 2020-03-05 DIAGNOSIS — Z23 Encounter for immunization: Secondary | ICD-10-CM | POA: Insufficient documentation

## 2020-03-05 NOTE — Progress Notes (Signed)
   Covid-19 Vaccination Clinic  Name:  Nicole [name]    MRN: [id number] DOB: June 02, 1956  03/05/2020  [name] was observed post Covid-19 immunization for 15 minutes without incident. She was provided with Vaccine Information Sheet and instruction to access the V-Safe system.   [name] was instructed to call 911 with any severe reactions post vaccine: [name] Difficulty breathing  . Swelling of face and throat  . A fast heartbeat  . A bad rash all over body  . Dizziness and weakness   Immunizations Administered    Name Date Dose VIS Date Route   Pfizer COVID-19 Vaccine 03/05/2020  1:10 PM 0.3 mL 12/10/2019 Intramuscular   Manufacturer: [place]   Lot: [id number]   [place]: [id number]

## 2020-05-15 ENCOUNTER — Other Ambulatory Visit: Payer: Self-pay | Admitting: Oncology

## 2020-05-15 DIAGNOSIS — C50411 Malignant neoplasm of upper-outer quadrant of right female breast: Secondary | ICD-10-CM

## 2020-05-15 DIAGNOSIS — Z170 Estrogen receptor positive status [ER+]: Secondary | ICD-10-CM

## 2020-05-16 ENCOUNTER — Inpatient Hospital Stay: Payer: Medicare Other

## 2020-05-16 ENCOUNTER — Ambulatory Visit (HOSPITAL_COMMUNITY): Admission: RE | Admit: 2020-05-16 | Payer: Medicare Other | Source: Ambulatory Visit

## 2020-05-22 ENCOUNTER — Inpatient Hospital Stay: Payer: Medicare Other | Attending: Oncology

## 2020-05-22 ENCOUNTER — Encounter (HOSPITAL_COMMUNITY): Payer: Self-pay

## 2020-05-22 ENCOUNTER — Other Ambulatory Visit: Payer: Self-pay

## 2020-05-22 ENCOUNTER — Other Ambulatory Visit: Payer: Self-pay | Admitting: Oncology

## 2020-05-22 ENCOUNTER — Encounter: Payer: Self-pay | Admitting: Oncology

## 2020-05-22 ENCOUNTER — Ambulatory Visit (HOSPITAL_COMMUNITY)
Admission: RE | Admit: 2020-05-22 | Discharge: 2020-05-22 | Disposition: A | Discharge status: Home / Self Care | Payer: Medicare Other | Source: Ambulatory Visit | Attending: Adult Health | Admitting: Adult Health

## 2020-05-22 DIAGNOSIS — Z170 Estrogen receptor positive status [ER+]: Secondary | ICD-10-CM

## 2020-05-22 DIAGNOSIS — C50411 Malignant neoplasm of upper-outer quadrant of right female breast: Secondary | ICD-10-CM | POA: Insufficient documentation

## 2020-05-22 DIAGNOSIS — C50919 Malignant neoplasm of unspecified site of unspecified female breast: Secondary | ICD-10-CM | POA: Insufficient documentation

## 2020-05-22 DIAGNOSIS — C771 Secondary and unspecified malignant neoplasm of intrathoracic lymph nodes: Secondary | ICD-10-CM | POA: Diagnosis present

## 2020-05-22 DIAGNOSIS — C7800 Secondary malignant neoplasm of unspecified lung: Secondary | ICD-10-CM | POA: Diagnosis present

## 2020-05-22 DIAGNOSIS — Z79811 Long term (current) use of aromatase inhibitors: Secondary | ICD-10-CM | POA: Insufficient documentation

## 2020-05-22 DIAGNOSIS — C773 Secondary and unspecified malignant neoplasm of axilla and upper limb lymph nodes: Secondary | ICD-10-CM | POA: Diagnosis present

## 2020-05-22 DIAGNOSIS — C7802 Secondary malignant neoplasm of left lung: Secondary | ICD-10-CM | POA: Insufficient documentation

## 2020-05-22 DIAGNOSIS — C7801 Secondary malignant neoplasm of right lung: Secondary | ICD-10-CM | POA: Insufficient documentation

## 2020-05-22 LAB — COMPREHENSIVE METABOLIC PANEL
ALT: 28 U/L (ref 0–44)
AST: 27 U/L (ref 15–41)
Albumin: 3.8 g/dL (ref 3.5–5.0)
Alkaline Phosphatase: 71 U/L (ref 38–126)
Anion gap: 10 (ref 5–15)
BUN: 16 mg/dL (ref 8–23)
CO2: 24 mmol/L (ref 22–32)
Calcium: 9 mg/dL (ref 8.9–10.3)
Chloride: 109 mmol/L (ref 98–111)
Creatinine, Ser: 1.24 mg/dL — ABNORMAL HIGH (ref 0.44–1.00)
GFR calc Af Amer: 53 mL/min — ABNORMAL LOW (ref >60)
GFR calc non Af Amer: 46 mL/min — ABNORMAL LOW (ref >60)
Glucose, Bld: 94 mg/dL (ref 70–99)
Potassium: 4.2 mmol/L (ref 3.5–5.1)
Sodium: 143 mmol/L (ref 135–145)
Total Bilirubin: 0.7 mg/dL (ref 0.3–1.2)
Total Protein: 6.8 g/dL (ref 6.5–8.1)

## 2020-05-22 LAB — CBC WITH DIFFERENTIAL/PLATELET
Abs Immature Granulocytes: 0.05 10*3/uL (ref 0.00–0.07)
Basophils Absolute: 0 10*3/uL (ref 0.0–0.1)
Basophils Relative: 1 %
Eosinophils Absolute: 0.1 10*3/uL (ref 0.0–0.5)
Eosinophils Relative: 2 %
HCT: 43.6 % (ref 36.0–46.0)
Hemoglobin: 14.7 g/dL (ref 12.0–15.0)
Immature Granulocytes: 1 %
Lymphocytes Relative: 12 %
Lymphs Abs: 0.8 10*3/uL (ref 0.7–4.0)
MCH: 30.1 pg (ref 26.0–34.0)
MCHC: 33.7 g/dL (ref 30.0–36.0)
MCV: 89.2 fL (ref 80.0–100.0)
Monocytes Absolute: 0.6 10*3/uL (ref 0.1–1.0)
Monocytes Relative: 9 %
Neutro Abs: 5 10*3/uL (ref 1.7–7.7)
Neutrophils Relative %: 75 %
Platelets: 99 10*3/uL — ABNORMAL LOW (ref 150–400)
RBC: 4.89 MIL/uL (ref 3.87–5.11)
RDW: 13.9 % (ref 11.5–15.5)
WBC: 6.6 10*3/uL (ref 4.0–10.5)
nRBC: 0 % (ref 0.0–0.2)

## 2020-05-22 MED ORDER — IOHEXOL 300 MG/ML  SOLN
75.0000 mL | Freq: Once | INTRAMUSCULAR | Status: AC | PRN
  Administered 2020-05-22: 75 mL via INTRAVENOUS

## 2020-05-22 MED ORDER — SODIUM CHLORIDE (PF) 0.9 % IJ SOLN
INTRAMUSCULAR | Status: AC
  Filled 2020-05-22: qty 50

## 2020-06-11 NOTE — Progress Notes (Signed)
[place]  [phone] [phone]    ID: Nicole [name]   DOB: 05-Oct-1956  MR#: [id number]  CBJ#:[id number]  Patient Care Team: Aretta Nip, MD as PCP - General (Family Medicine) Roosevelt Locks, CRNP as Nurse Practitioner (Nurse Practitioner) Rolm Bookbinder, MD as Consulting Physician (General Surgery) Laurence Spates, MD (Inactive) as Consulting Physician (Gastroenterology) [name], MD as Consulting Physician (Gastroenterology) Nathalia Wismer, Virgie Dad, MD as Consulting Physician (Oncology) OTHER MD:  CHIEF COMPLAINT:  Metastatic Breast Cancer (s/p right mastectomy)  CURRENT TREATMENT: Letrozole, denosumab/Prolia    INTERVAL HISTORY: Randilyn was scheduled today for follow-up of her estrogen receptor positive stage IV breast cancer.   Jazzalynn is taking Letrozole daily and is tolerating this well.  Hot flashes and vaginal dryness are not an issue for her.  Her most recent bone density was completed in 10/2018 and consistent with osteopenia with a T score of -2.2 in the left femur.  She receives Prolia every 6 months for this.    Since her last visit, she underwent restaging chest CT on 05/22/2020 showing: stable bilateral pulmonary nodules; new small right pleural effusion; indeterminate new small volume of fluid overlying anterolateral dome of liver; stable nonspecific sclerotic changes involving body of sternum.  She is due for annual left screening mammogram later this month.  She receives denosumab/Prolia every 6 months with a dose due today.   REVIEW OF SYSTEMS: Thy is not exercising.  She also tells me she has a terrible diet eating lots of carbs and drinking ginger ale all day while she watches TV.  She tells me that she saw a liver doctor many years ago but does not know why, who the doctor was, or what the outcome of the visit was.  She has had both doses of the Moderna vaccine and tolerated them well.  A detailed review of systems  today was otherwise stable   BREAST CANCER HISTORY: From Dr. Collier Salina Rubin's 07/25/2010 note:  "This woman has not had any significant medical intervention for some time.  Her last mammogram was about seven years ago.  She noted a right breast mass about a year ago and did not seek immediate medical attention for this.  She ultimately, after some time, admitted this to her husband and self-referred herself for intervention.  She had a mammogram on 07/17/2010 with an ultrasound of the right breast.  This showed a lobulated mass upper right outer quadrant measuring at least 6.3 x 7.3 cm and large right axillary lymph nodes were also noted.  There was skin thickening overlying the mass.  On physical exam, this mass was about an 8 cm with skin dimpling noted. Discolored area in the skin over the mass, fullness in the axilla.  Biopsy was recommended, which took place on 07/17/2010.  Pathology showed invasive ductal cancer involving both lymph node and breast.  The HER-2 was not amplified.  ER and PR both positive at 100% and 6% respectively.  Proliferative index was 12% involving both the lymph node and breast mass.  An MRI scan of both breasts was performed on 07/22/2010, essentially which showed a large heterogeneously enhancing mass of the right breast with washout kinetics measuring 6.4 x 4.4 x 5.0 cm.  Numerous satellite nodules were seen throughout the dominant mass.  There were several suspicious nodules in the upper central and upper inner quadrant of the right breast.  There were two enhancing subcentimeter nodules seen in the right pectoralis muscle.  No obvious chest  wall nodules were seen; however, there was seen some metastatic adenopathy in the mediastinum as well as hila with a 2.4 x 2.0 cm mediastinal lymph node as well as 1.2 x 3.0 cm subcarinal lymph node.  There was a right hilar and probable left hilar adenopathy.  Bilateral pulmonary nodules were seen and a right T2 lesion was seen anterior right  liver as well."  Her subsequent history is as detailed below.   PAST MEDICAL HISTORY: Past Medical History:  Diagnosis Date   Anemia    low iron   Arthritis    Blood transfusion 2012   Breast CA ([place])    breast ca dx 06/2010, stage 4, right , Lung Metasis.Surgery, Chemo, Radiation   Clot    jugular  ?2015   Depression    Esophageal varices ([place])    Headache    Mirgraine- rare since menopause   Heart murmur    mild, no cardiologist, from birth   Hematemesis 08/2017   History of kidney stones    no pain- shows in CT- small 1 mm bilaterally- kidney   IBD (inflammatory bowel disease)    resolved   Idiopathic cirrhosis ([place])    pt denies   Peripheral vascular disease ([place]) few yrs ago   clot in right jugular   Portal hypertension ([place])    Radiation 07/21/12-09/07/12   5940 cGy   Restless legs     PAST SURGICAL HISTORY: Past Surgical History:  Procedure Laterality Date   COLONOSCOPY  2016   DILATION AND CURETTAGE OF UTERUS  2004?   ESOPHAGEAL BANDING  09/04/2017   Procedure: ESOPHAGEAL BANDING;  Surgeon: [name], MD;  Location[facility]COPY;  Service: Gastroenterology;;   ESOPHAGEAL BANDING  11/27/2017   Procedure: ESOPHAGEAL BANDING;  Surgeon: [name], MD;  Location: [place];  Service: Gastroenterology;;   ESOPHAGEAL BANDING N/A 04/28/2018   Procedure: ESOPHAGEAL BANDING;  Surgeon: [name], MD;  Location: [facility] ENDOSCOPY;  Service: Gastroenterology;  Laterality: N/A;   ESOPHAGEAL BANDING  11/17/2018   Procedure: ESOPHAGEAL BANDING;  Surgeon: [name], MD;  Location: [facility] ENDOSCOPY;  Service: Gastroenterology;;   ESOPHAGEAL BANDING  08/16/2019   Procedure: ESOPHAGEAL BANDING;  Surgeon: [name], MD;  Location: [facility] ENDOSCOPY;  Service: Gastroenterology;;   ESOPHAGOGASTRODUODENOSCOPY N/A 04/28/2018   Procedure: ESOPHAGOGASTRODUODENOSCOPY (EGD);  Surgeon: [name], MD;  Location: [facility] ENDOSCOPY;  Service: Gastroenterology;  Laterality:  N/A;   ESOPHAGOGASTRODUODENOSCOPY N/A 11/17/2018   Procedure: ESOPHAGOGASTRODUODENOSCOPY (EGD);  Surgeon: [name], MD;  Location: [facility] ENDOSCOPY;  Service: Gastroenterology;  Laterality: N/A;   ESOPHAGOGASTRODUODENOSCOPY (EGD) WITH PROPOFOL N/A 04/11/2016   Procedure: ESOPHAGOGASTRODUODENOSCOPY (EGD) WITH PROPOFOL;  Surgeon: Clarene Essex, MD;  Location: [facility] ENDOSCOPY;  Service: Endoscopy;  Laterality: N/A;   ESOPHAGOGASTRODUODENOSCOPY (EGD) WITH PROPOFOL N/A 07/08/2016   Procedure: ESOPHAGOGASTRODUODENOSCOPY (EGD) WITH PROPOFOL;  Surgeon: Laurence Spates, MD;  Location: [place];  Service: Endoscopy;  Laterality: N/A;   ESOPHAGOGASTRODUODENOSCOPY (EGD) WITH PROPOFOL N/A 03/03/2017   Procedure: ESOPHAGOGASTRODUODENOSCOPY (EGD) WITH PROPOFOL;  Surgeon: Laurence Spates, MD;  Location: [place];  Service: Endoscopy;  Laterality: N/A;   ESOPHAGOGASTRODUODENOSCOPY (EGD) WITH PROPOFOL N/A 09/04/2017   Procedure: ESOPHAGOGASTRODUODENOSCOPY (EGD) WITH PROPOFOL;  Surgeon: [name], MD;  Location: [place];  Service: Gastroenterology;  Laterality: N/A;   ESOPHAGOGASTRODUODENOSCOPY (EGD) WITH PROPOFOL N/A 11/27/2017   Procedure: ESOPHAGOGASTRODUODENOSCOPY (EGD);  Surgeon: [name], MD;  Location: [place];  Service: Gastroenterology;  Laterality: N/A;   ESOPHAGOGASTRODUODENOSCOPY (EGD) WITH PROPOFOL N/A 08/16/2019   Procedure: ESOPHAGOGASTRODUODENOSCOPY (EGD) WITH PROPOFOL;  Surgeon: [name], MD;  Location: [facility] ENDOSCOPY;  Service: Gastroenterology;  Laterality: N/A;   IR GENERIC HISTORICAL  05/15/2016   IR RADIOLOGIST EVAL & MGMT 05/15/2016 Corrie Mckusick, DO [facility] INTERV RAD   IR GENERIC HISTORICAL  01/14/2017   IR US GUIDE VASC ACCESS RIGHT 01/14/2017 Greggory Keen, MD [facility]   IR GENERIC HISTORICAL  01/14/2017   IR VENOGRAM HEPATIC W HEMODYNAMIC EVALUATION 01/14/2017 Greggory Keen, MD [facility]   IR RADIOLOGIST EVAL & MGMT  06/12/2017   IR RADIOLOGIST EVAL & MGMT  08/11/2018    IR RADIOLOGIST EVAL & MGMT  08/25/2019   MASTECTOMY Right 06/02/12   right breast with lymph node removal   PORT-A-CATH REMOVAL     PORTACATH PLACEMENT  2011   left side   RADIOLOGY WITH ANESTHESIA N/A 04/13/2016   Procedure: RADIOLOGY WITH ANESTHESIA;  Surgeon: Corrie Mckusick, DO;  Location: [place];  Service: Anesthesiology;  Laterality: N/A;    FAMILY HISTORY Family History  Problem Relation Age of Onset   COPD Mother    AAA (abdominal aortic aneurysm) Mother    COPD Father    Cancer Neg Hx   The patient's father died at the age of 61 from heart disease. Patient's mother died at the age of 5 status post CABG. The patient had no brothers, 2 sisters. There is no history of breast or ovarian cancer in the family.   GYNECOLOGIC HISTORY: Updated May 2018 Menarche age 59, she is GX P0. She does not recall when she went through menopause. She never took hormone replacement.   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 Husband Merry Proud is retired from [employer]. They have no children and no pets. They attend a local [employer].    ADVANCED DIRECTIVES: Not in place   HEALTH MAINTENANCE: Social History   Tobacco Use   Smoking status: Never Smoker   Smokeless tobacco: Never Used  Vaping Use   Vaping Use: Never used  Substance Use Topics   Alcohol use: No   Drug use: No     Colonoscopy: Never  PAP: Remote  Bone density: 09/27/2013, osteoporosis with a T score of -2.8  Lipid panel: Not on file    No Known Allergies  Current Outpatient Medications  Medication Sig Dispense Refill   Calcium Carb-Cholecalciferol (CALCIUM 600+D3) 600-800 MG-UNIT TABS Take 1 tablet by mouth at bedtime.     denosumab (PROLIA) 60 MG/ML SOLN injection Inject 60 mg into the skin every 6 (six) months. Administer in upper arm, thigh, or abdomen     letrozole (FEMARA) 2.5 MG tablet  TAKE 1 TABLET (2.5 MG TOTAL) BY MOUTH AT BEDTIME. 90 tablet 1   Multiple Vitamin (MULTIVITAMIN WITH MINERALS) TABS tablet Take 1 tablet by mouth daily. Centrum Silver     propranolol (INDERAL) 20 MG tablet Take 1 tablet (20 mg total) by mouth 2 (two) times daily. 60 tablet 0   venlafaxine XR (EFFEXOR-XR) 150 MG 24 hr capsule TAKE 1 CAPSULE (150 MG TOTAL) BY MOUTH DAILY WITH BREAKFAST. 90 capsule 1   No current facility-administered medications for this visit.   Facility-Administered Medications Ordered in Other Visits  Medication Dose Route Frequency Provider Last Rate Last Admin   denosumab (PROLIA) injection 60 mg  60 mg Subcutaneous Once Quentin Strebel, Virgie Dad, MD        OBJECTIVE: white woman in no acute distress  Vitals:   06/12/20 1201  BP: 120/79  Pulse: 77  Resp: 17  Temp: 98.3 F (36.8 C)  TempSrc: Temporal  SpO2: 98%  Weight: 157 lb 12.8 oz (71.6 kg)  Height: _0  (1.626 m)    Sclerae unicteric, EOMs intact Wearing a mask No cervical or supraclavicular adenopathy Lungs no rales or rhonchi Heart regular rate and rhythm Abd soft, nontender, positive bowel sounds, no masses palpated MSK no focal spinal tenderness, no upper extremity lymphedema Neuro: nonfocal, well oriented, appropriate affect Breasts: The right breast is status post mastectomy and radiation.  There are significant telangiectasias.  There is no evidence of local recurrence.  Left breast is benign.  Both axillae are benign.   LAB RESULTS:  CMP     Component Value Date/Time   NA 143 05/22/2020 0904   NA 142 11/17/2017 1355   K 4.2 05/22/2020 0904   K 4.4 11/17/2017 1355   CL 109 05/22/2020 0904   CL 109 (H) 04/20/2013 0859   CO2 24 05/22/2020 0904   CO2 22 11/17/2017 1355   GLUCOSE 94 05/22/2020 0904   GLUCOSE 86 11/17/2017 1355   GLUCOSE 91 04/20/2013 0859   BUN 16 05/22/2020 0904   BUN 12.5 11/17/2017 1355   CREATININE 1.24 (H) 05/22/2020 0904   CREATININE 1.20 (H) 12/13/2019 1200    CREATININE 1.1 11/17/2017 1355   CALCIUM 9.0 05/22/2020 0904   CALCIUM 9.2 11/17/2017 1355   PROT 6.8 05/22/2020 0904   PROT 7.0 11/17/2017 1355   ALBUMIN 3.8 05/22/2020 0904   ALBUMIN 3.8 11/17/2017 1355   AST 27 05/22/2020 0904   AST 25 12/13/2019 1200   AST 23 11/17/2017 1355   ALT 28 05/22/2020 0904   ALT 24 12/13/2019 1200   ALT 22 11/17/2017 1355   ALKPHOS 71 05/22/2020 0904   ALKPHOS 55 11/17/2017 1355   BILITOT 0.7 05/22/2020 0904   BILITOT 0.8 12/13/2019 1200   BILITOT 0.49 11/17/2017 1355   GFRNONAA 46 (L) 05/22/2020 0904   GFRNONAA 48 (L) 12/13/2019 1200   GFRNONAA 53 (L) 05/09/2016 1104   GFRAA 53 (L) 05/22/2020 0904   GFRAA 55 (L) 12/13/2019 1200   GFRAA 61 05/09/2016 1104    No results found for: TOTALPROTELP, ALBUMINELP, A1GS, A2GS, BETS, BETA2SER, GAMS, MSPIKE, SPEI  Lab Results  Component Value Date   WBC 6.7 06/12/2020   NEUTROABS 5.2 06/12/2020   HGB 14.9 06/12/2020   HCT 44.6 06/12/2020   MCV 91.0 06/12/2020   PLT 111 (L) 06/12/2020    Lab Results  Component Value Date   LABCA2 18 08/21/2012    No components found for: PFXTKW409  No results for input(s): INR in the last 168 hours.  Lab Results  Component Value Date   LABCA2 18 08/21/2012    No results found for: CAN199  No results found for: BDZ329  No results found for: JME268  Lab Results  Component Value Date   CA2729 26.9 12/13/2019    No components found for: HGQUANT  No results found for: CEA1 / No results found for: CEA1   No results found for: AFPTUMOR  No results found for: CHROMOGRNA  No results found for: KPAFRELGTCHN, LAMBDASER, KAPLAMBRATIO (kappa/lambda light chains)  No results found for: HGBA, HGBA2QUANT, HGBFQUANT, HGBSQUAN (Hemoglobinopathy evaluation)   Lab Results  Component Value Date   LDH 195 08/21/2012    Lab Results  Component Value Date   IRON 27 (L) 04/11/2016   TIBC 340 04/11/2016   IRONPCTSAT  8 (L) 04/11/2016   (Iron and  TIBC)  Lab Results  Component Value Date   FERRITIN 18 04/11/2016    Urinalysis    Component Value Date/Time   LABSPEC 1.010 04/29/2016 1118   PHURINE 7.5 04/29/2016 1118   GLUCOSEU Negative 04/29/2016 1118   HGBUR Trace 04/29/2016 1118   BILIRUBINUR Negative 04/29/2016 1118   KETONESUR Negative 04/29/2016 1118   PROTEINUR < 30 04/29/2016 1118   UROBILINOGEN 0.2 04/29/2016 1118   NITRITE Negative 04/29/2016 1118   LEUKOCYTESUR Trace 04/29/2016 1118    STUDIES: CT Chest W Contrast  Result Date: 05/22/2020 CLINICAL DATA:  Metastatic breast cancer. Restaging. EXAM: CT CHEST WITH CONTRAST TECHNIQUE: Multidetector CT imaging of the chest was performed during intravenous contrast administration. CONTRAST:  20m OMNIPAQUE IOHEXOL 300 MG/ML  SOLN COMPARISON:  06/03/2019 FINDINGS: Cardiovascular: Normal heart size. Aortic atherosclerosis. No pericardial effusion. Mediastinum/Nodes: Normal appearance of the thyroid gland. The trachea appears patent and is midline. Similar appearance of mild circumferential wall thickening involving the distal esophagus just before the GE junction. The No supraclavicular or axillary adenopathy. No mediastinal or hilar adenopathy. Lungs/Pleura: There is a new small right pleural effusion, image 80/2. Subpleural fibrotic changes within the anterior right lung identified favored to represent changes of external beam radiation. Left lower lobe lung nodule measures 9 mm, image 70/5. Previously this measured 8 mm. 7 mm right upper lobe lung nodule adjacent to fibrotic changes appears stable, image 49/5. Right lower lobe lung nodule is unchanged measuring 1 cm, image 75/5. No new lung nodules identified. Upper Abdomen: Mild cirrhotic changes are noted involving the liver. New small volume of fluid overlies the anterolateral dome of the liver, image 111/2. Right lobe of liver cyst appears unchanged. Musculoskeletal: Unchanged sclerotic changes involving the body of sternum,  image 89/7. Status post right mastectomy. IMPRESSION: 1. Stable appearance of bilateral pulmonary nodules. 2. New small right pleural effusion. No frank pleural nodularity identified. Suggest close interval follow-up to exclude the possibility of underlying pleural involvement by tumor. 3. New small volume of fluid overlies the anterolateral dome of the liver. Etiology indeterminate. 4. Stable nonspecific sclerotic changes involving the body of sternum. No aggressive or acute bone abnormality noted 5. Aortic atherosclerosis. Aortic Atherosclerosis (ICD10-I70.0). Electronically Signed   By: TKerby MoorsM.D.   On: 05/22/2020 10:51     ASSESSMENT: 64y.o.  woman presenting June 2011 with stage IV breast cancer involving the right breast upper outer quadrant, right axilla, mediastinal lymph nodes, and both lungs, but not the brain, liver, or bones  (1) positive right breast and right axillary lymph node biopsies 07/17/2010 of a clinical T4 N2 M1 invasive ductal carcinoma, grade 1, estrogen receptor 100% positive, progesterone receptor 6% positive, with an MIB-1 of 12%, and no HER-2 amplification.  (2) participated in Phase II tessetaxel study, receiving 2 cycles complicated by thrombocytopenia, anemia requiring transfusion, transaminase elevation, and afebrile neutropenia. Off-study as of September 2011. Chest CT scan September 2011 did show evidence of response.  (3) on letrozole as of October 2011, with continuing response and good tolerance   (4) 06/02/2012 underwent right mastectomy and axillary lymph node sampling (3 lymph nodes removed, all with viable cancer as well as evidence of treatment effect) for a ypT2 ypN1-2 invasive ductal carcinoma, grade 1, 98% estrogen receptor positive, progesterone receptor negative, with no HER-2 amplification  (5) left jugular vein DVT documented 06/16/2012; left sided Port-A-Cath removed mid July 2015; Coumadin stopped 09/07/2014.  (a) Left  brachiocephalic v.  slightly narrowed, with some Left lower neck collateralization noted on chest CT December 2015  (6) osteoporosis, was on alendronate, switched to denosumab/Prolia 11/07/2016  (a) bone density in 10/2018 shows osteopenia T score of -2.2  (7) portal hypertension, with splenomegaly, gastric varices, and intestinal edema  (a)  mild persistent leukopenia and thrombocytopenia likely due to splenomegaly  (b) iron deficiency secondary to above, status post Feraheme x2 December 2018  (8) restaging studies:  (a) chest CT 12/26/2017 shows a 4.5 mm left lower lobe pulmonary nodule which is unchanged.  There was no other evidence of active disease  (b) restaging chest CT in 05/2019 shows no change in bilateral pulmonary nodules, prominent mediastinal lymph nodes are stable   PLAN:  Sharyon is 10 years out from her initial presentation with metastatic breast cancer.  Her disease is very well controlled on her current treatment.  We are planning to continue letrozole until there is evidence of local recurrence.  She receives Prolia every 82month for her osteopenia and she will be due for repeat bone density later this year.  We discussed her CT of the chest in detail.  It shows what looks like early cirrhosis.  We did not see this in the most recent 2 sets of scans.  Again she tells me she saw a liver specialist for some reason sometime ago.  I have asked her to go ahead and contact that office and get the records so we do not need to duplicate work-up.  I did recommend that she change her diet and basically avoid carbohydrates.  I also asked her to start an exercise program the goal being 45 minutes 5 times a week.  She will see me again in 6 months with the next Prolia dose and before that visit we will repeat a chest CT scan  She knows to call for any other issue that may develop before then  Total encounter time 35 minutes.*Sarajane JewsC. Berle Fitz, MD 06/12/20 12:05 PM Medical  On[facility] [place] [id number]. [phone]   Fax. [phone]  I, KWilburn Mylar am acting as scribe for Dr. GVirgie Dad Draven Laine.  I, GLurline DelMD, have reviewed the above documentation for accuracy and completeness, and I agree with the above.    *Total Encounter Time as defined by the Centers for Medicare and Medicaid Services includes, in addition to the face-to-face time of a patient visit (documented in the note above) non-face-to-face time: obtaining and reviewing outside history, ordering and reviewing medications, tests or procedures, care coordination (communications with other health care professionals or caregivers) and documentation in the medical record.

## 2020-06-12 ENCOUNTER — Inpatient Hospital Stay (HOSPITAL_BASED_OUTPATIENT_CLINIC_OR_DEPARTMENT_OTHER): Payer: Medicare Other | Admitting: Oncology

## 2020-06-12 ENCOUNTER — Inpatient Hospital Stay: Payer: Medicare Other

## 2020-06-12 ENCOUNTER — Other Ambulatory Visit: Payer: Self-pay

## 2020-06-12 ENCOUNTER — Inpatient Hospital Stay: Payer: Medicare Other | Attending: Oncology

## 2020-06-12 VITALS — BP 120/79 | HR 77 | Temp 98.3°F | Resp 17 | Ht 64.0 in | Wt 157.8 lb

## 2020-06-12 DIAGNOSIS — Z79899 Other long term (current) drug therapy: Secondary | ICD-10-CM | POA: Diagnosis not present

## 2020-06-12 DIAGNOSIS — C771 Secondary and unspecified malignant neoplasm of intrathoracic lymph nodes: Secondary | ICD-10-CM | POA: Insufficient documentation

## 2020-06-12 DIAGNOSIS — G2581 Restless legs syndrome: Secondary | ICD-10-CM | POA: Insufficient documentation

## 2020-06-12 DIAGNOSIS — C50919 Malignant neoplasm of unspecified site of unspecified female breast: Secondary | ICD-10-CM

## 2020-06-12 DIAGNOSIS — C7951 Secondary malignant neoplasm of bone: Secondary | ICD-10-CM | POA: Insufficient documentation

## 2020-06-12 DIAGNOSIS — F329 Major depressive disorder, single episode, unspecified: Secondary | ICD-10-CM | POA: Diagnosis not present

## 2020-06-12 DIAGNOSIS — K589 Irritable bowel syndrome without diarrhea: Secondary | ICD-10-CM | POA: Insufficient documentation

## 2020-06-12 DIAGNOSIS — C773 Secondary and unspecified malignant neoplasm of axilla and upper limb lymph nodes: Secondary | ICD-10-CM | POA: Diagnosis present

## 2020-06-12 DIAGNOSIS — D649 Anemia, unspecified: Secondary | ICD-10-CM | POA: Insufficient documentation

## 2020-06-12 DIAGNOSIS — Z9011 Acquired absence of right breast and nipple: Secondary | ICD-10-CM | POA: Insufficient documentation

## 2020-06-12 DIAGNOSIS — Z8249 Family history of ischemic heart disease and other diseases of the circulatory system: Secondary | ICD-10-CM | POA: Diagnosis not present

## 2020-06-12 DIAGNOSIS — C7800 Secondary malignant neoplasm of unspecified lung: Secondary | ICD-10-CM

## 2020-06-12 DIAGNOSIS — Z87442 Personal history of urinary calculi: Secondary | ICD-10-CM | POA: Diagnosis not present

## 2020-06-12 DIAGNOSIS — Z79811 Long term (current) use of aromatase inhibitors: Secondary | ICD-10-CM | POA: Insufficient documentation

## 2020-06-12 DIAGNOSIS — C50411 Malignant neoplasm of upper-outer quadrant of right female breast: Secondary | ICD-10-CM | POA: Insufficient documentation

## 2020-06-12 DIAGNOSIS — M810 Age-related osteoporosis without current pathological fracture: Secondary | ICD-10-CM

## 2020-06-12 DIAGNOSIS — Z170 Estrogen receptor positive status [ER+]: Secondary | ICD-10-CM | POA: Insufficient documentation

## 2020-06-12 DIAGNOSIS — Z86718 Personal history of other venous thrombosis and embolism: Secondary | ICD-10-CM | POA: Insufficient documentation

## 2020-06-12 LAB — CBC WITH DIFFERENTIAL/PLATELET
Abs Immature Granulocytes: 0.05 10*3/uL (ref 0.00–0.07)
Basophils Absolute: 0 10*3/uL (ref 0.0–0.1)
Basophils Relative: 1 %
Eosinophils Absolute: 0.1 10*3/uL (ref 0.0–0.5)
Eosinophils Relative: 1 %
HCT: 44.6 % (ref 36.0–46.0)
Hemoglobin: 14.9 g/dL (ref 12.0–15.0)
Immature Granulocytes: 1 %
Lymphocytes Relative: 12 %
Lymphs Abs: 0.8 10*3/uL (ref 0.7–4.0)
MCH: 30.4 pg (ref 26.0–34.0)
MCHC: 33.4 g/dL (ref 30.0–36.0)
MCV: 91 fL (ref 80.0–100.0)
Monocytes Absolute: 0.6 10*3/uL (ref 0.1–1.0)
Monocytes Relative: 9 %
Neutro Abs: 5.2 10*3/uL (ref 1.7–7.7)
Neutrophils Relative %: 76 %
Platelets: 111 10*3/uL — ABNORMAL LOW (ref 150–400)
RBC: 4.9 MIL/uL (ref 3.87–5.11)
RDW: 13.9 % (ref 11.5–15.5)
WBC: 6.7 10*3/uL (ref 4.0–10.5)
nRBC: 0 % (ref 0.0–0.2)

## 2020-06-12 LAB — COMPREHENSIVE METABOLIC PANEL
ALT: 27 U/L (ref 0–44)
AST: 25 U/L (ref 15–41)
Albumin: 3.8 g/dL (ref 3.5–5.0)
Alkaline Phosphatase: 74 U/L (ref 38–126)
Anion gap: 14 (ref 5–15)
BUN: 15 mg/dL (ref 8–23)
CO2: 21 mmol/L — ABNORMAL LOW (ref 22–32)
Calcium: 9.2 mg/dL (ref 8.9–10.3)
Chloride: 108 mmol/L (ref 98–111)
Creatinine, Ser: 1.23 mg/dL — ABNORMAL HIGH (ref 0.44–1.00)
GFR calc Af Amer: 53 mL/min — ABNORMAL LOW (ref >60)
GFR calc non Af Amer: 46 mL/min — ABNORMAL LOW (ref >60)
Glucose, Bld: 100 mg/dL — ABNORMAL HIGH (ref 70–99)
Potassium: 4.2 mmol/L (ref 3.5–5.1)
Sodium: 143 mmol/L (ref 135–145)
Total Bilirubin: 0.9 mg/dL (ref 0.3–1.2)
Total Protein: 6.8 g/dL (ref 6.5–8.1)

## 2020-06-12 MED ORDER — DENOSUMAB 60 MG/ML ~~LOC~~ SOSY
PREFILLED_SYRINGE | SUBCUTANEOUS | Status: AC
  Filled 2020-06-12: qty 1

## 2020-06-12 MED ORDER — DENOSUMAB 60 MG/ML ~~LOC~~ SOSY
60.0000 mg | PREFILLED_SYRINGE | Freq: Once | SUBCUTANEOUS | Status: AC
  Administered 2020-06-12: 60 mg via SUBCUTANEOUS

## 2020-06-13 ENCOUNTER — Telehealth: Payer: Self-pay | Admitting: Oncology

## 2020-06-13 ENCOUNTER — Encounter: Payer: Self-pay | Admitting: Oncology

## 2020-06-13 NOTE — Telephone Encounter (Signed)
Scheduled appts per 6/14 los. Pt confirmed appt date and time.

## 2020-07-24 ENCOUNTER — Other Ambulatory Visit: Payer: Self-pay | Admitting: Family Medicine

## 2020-07-24 DIAGNOSIS — Z1231 Encounter for screening mammogram for malignant neoplasm of breast: Secondary | ICD-10-CM

## 2020-07-26 ENCOUNTER — Other Ambulatory Visit: Payer: Self-pay | Admitting: Interventional Radiology

## 2020-07-26 DIAGNOSIS — I864 Gastric varices: Secondary | ICD-10-CM

## 2020-08-02 ENCOUNTER — Other Ambulatory Visit: Payer: Self-pay | Admitting: Gastroenterology

## 2020-08-03 ENCOUNTER — Other Ambulatory Visit: Payer: Self-pay | Admitting: Student

## 2020-08-09 ENCOUNTER — Telehealth: Payer: Medicare Other

## 2020-08-16 ENCOUNTER — Other Ambulatory Visit: Payer: Self-pay | Admitting: Oncology

## 2020-08-16 DIAGNOSIS — C50411 Malignant neoplasm of upper-outer quadrant of right female breast: Secondary | ICD-10-CM

## 2020-08-16 DIAGNOSIS — C50919 Malignant neoplasm of unspecified site of unspecified female breast: Secondary | ICD-10-CM

## 2020-08-17 ENCOUNTER — Other Ambulatory Visit (HOSPITAL_COMMUNITY)
Admission: RE | Admit: 2020-08-17 | Discharge: 2020-08-17 | Disposition: A | Discharge status: Home / Self Care | Payer: Medicare Other | Source: Ambulatory Visit | Attending: Gastroenterology | Admitting: Gastroenterology

## 2020-08-17 DIAGNOSIS — Z20822 Contact with and (suspected) exposure to covid-19: Secondary | ICD-10-CM | POA: Insufficient documentation

## 2020-08-17 DIAGNOSIS — Z01812 Encounter for preprocedural laboratory examination: Secondary | ICD-10-CM | POA: Diagnosis present

## 2020-08-17 LAB — SARS CORONAVIRUS 2 (TAT 6-24 HRS): SARS Coronavirus 2: NEGATIVE

## 2020-08-21 ENCOUNTER — Encounter (HOSPITAL_COMMUNITY): Payer: Self-pay | Admitting: Gastroenterology

## 2020-08-21 ENCOUNTER — Other Ambulatory Visit: Payer: Self-pay

## 2020-08-21 ENCOUNTER — Ambulatory Visit (HOSPITAL_COMMUNITY): Payer: Medicare Other | Admitting: Certified Registered Nurse Anesthetist

## 2020-08-21 ENCOUNTER — Ambulatory Visit (HOSPITAL_COMMUNITY)
Admission: RE | Admit: 2020-08-21 | Discharge: 2020-08-21 | Disposition: A | Discharge status: Home / Self Care | Payer: Medicare Other | Attending: Gastroenterology | Admitting: Gastroenterology

## 2020-08-21 ENCOUNTER — Encounter (HOSPITAL_COMMUNITY)
Admission: RE | Disposition: A | Discharge status: Home / Self Care | Payer: Self-pay | Source: Home / Self Care | Attending: Gastroenterology

## 2020-08-21 DIAGNOSIS — I10 Essential (primary) hypertension: Secondary | ICD-10-CM | POA: Diagnosis not present

## 2020-08-21 DIAGNOSIS — Z853 Personal history of malignant neoplasm of breast: Secondary | ICD-10-CM | POA: Diagnosis not present

## 2020-08-21 DIAGNOSIS — Z79899 Other long term (current) drug therapy: Secondary | ICD-10-CM | POA: Insufficient documentation

## 2020-08-21 DIAGNOSIS — Z85118 Personal history of other malignant neoplasm of bronchus and lung: Secondary | ICD-10-CM | POA: Diagnosis not present

## 2020-08-21 DIAGNOSIS — Z923 Personal history of irradiation: Secondary | ICD-10-CM | POA: Diagnosis not present

## 2020-08-21 DIAGNOSIS — K317 Polyp of stomach and duodenum: Secondary | ICD-10-CM | POA: Diagnosis not present

## 2020-08-21 DIAGNOSIS — K259 Gastric ulcer, unspecified as acute or chronic, without hemorrhage or perforation: Secondary | ICD-10-CM | POA: Insufficient documentation

## 2020-08-21 DIAGNOSIS — D696 Thrombocytopenia, unspecified: Secondary | ICD-10-CM | POA: Insufficient documentation

## 2020-08-21 DIAGNOSIS — I8500 Esophageal varices without bleeding: Secondary | ICD-10-CM | POA: Diagnosis present

## 2020-08-21 DIAGNOSIS — K319 Disease of stomach and duodenum, unspecified: Secondary | ICD-10-CM | POA: Diagnosis not present

## 2020-08-21 DIAGNOSIS — Z86718 Personal history of other venous thrombosis and embolism: Secondary | ICD-10-CM | POA: Diagnosis not present

## 2020-08-21 DIAGNOSIS — M1990 Unspecified osteoarthritis, unspecified site: Secondary | ICD-10-CM | POA: Diagnosis not present

## 2020-08-21 HISTORY — PX: BIOPSY: SHX5522

## 2020-08-21 HISTORY — PX: ESOPHAGOGASTRODUODENOSCOPY (EGD) WITH PROPOFOL: SHX5813

## 2020-08-21 HISTORY — PX: HEMOSTASIS CLIP PLACEMENT: SHX6857

## 2020-08-21 HISTORY — PX: ESOPHAGEAL BANDING: SHX5518

## 2020-08-21 SURGERY — ESOPHAGOGASTRODUODENOSCOPY (EGD) WITH PROPOFOL
Anesthesia: Monitor Anesthesia Care

## 2020-08-21 MED ORDER — LIDOCAINE HCL (CARDIAC) PF 100 MG/5ML IV SOSY
PREFILLED_SYRINGE | INTRAVENOUS | Status: DC | PRN
  Administered 2020-08-21: 100 mg via INTRAVENOUS

## 2020-08-21 MED ORDER — PROPOFOL 500 MG/50ML IV EMUL
INTRAVENOUS | Status: DC | PRN
  Administered 2020-08-21: 160 ug/kg/min via INTRAVENOUS

## 2020-08-21 MED ORDER — LACTATED RINGERS IV SOLN: INTRAVENOUS | Status: DC

## 2020-08-21 MED ORDER — SODIUM CHLORIDE 0.9 % IV SOLN: INTRAVENOUS | Status: DC

## 2020-08-21 MED ORDER — PROPOFOL 10 MG/ML IV BOLUS
INTRAVENOUS | Status: DC | PRN
  Administered 2020-08-21: 60 mg via INTRAVENOUS

## 2020-08-21 MED ORDER — PANTOPRAZOLE SODIUM 40 MG PO TBEC: 40.0000 mg | DELAYED_RELEASE_TABLET | Freq: Every day | ORAL | 0 refills | Status: DC

## 2020-08-21 SURGICAL SUPPLY — 14 items

## 2020-08-21 NOTE — Interval H&P Note (Signed)
History and Physical Interval Note: 63/female with portal HTN and esophageal varices for EGD with possible banding.  08/21/2020 1:41 PM  Nicole [name]  has presented today for Egd with possible banding, with the diagnosis of Esophageal varices.  The various methods of treatment have been discussed with the patient and family. After consideration of risks, benefits and other options for treatment, the patient has consented to  Procedure(s): ESOPHAGOGASTRODUODENOSCOPY (EGD) WITH PROPOFOL (N/A) GASTRIC VARICES BANDING (N/A) as a surgical intervention.  The patient's history has been reviewed, patient examined, no change in status, stable for surgery.  I have reviewed the patient's chart and labs.  Questions were answered to the patient's satisfaction.     [name]

## 2020-08-21 NOTE — Op Note (Signed)
[facility] Patient Name: Nicole [name] Procedure Date: 08/21/2020 MRN: [id number] Attending MD: [name] , MD Date of Birth: [birth year] CSN: [id number] Age: 64 Admit Type: Outpatient Procedure:                Upper GI endoscopy Indications:              For therapy of esophageal varices Providers:                [name], MD, Clyde Lundborg, RN, Tyna Jaksch                            Technician, Stephanie [place] CRNA Referring MD:             Jordan Hawks Rankins,MD Medicines:                Monitored Anesthesia Care Complications:            No immediate complications. Estimated blood loss:                            Minimal. Estimated Blood Loss:     Estimated blood loss was minimal. Procedure:                Pre-Anesthesia Assessment:                           - Prior to the procedure, a History and Physical                            was performed, and patient medications and                            allergies were reviewed. The patient's tolerance of                            previous anesthesia was also reviewed. The risks                            and benefits of the procedure and the sedation                            options and risks were discussed with the patient.                            All questions were answered, and informed consent                            was obtained. Prior Anticoagulants: The patient has                            taken no previous anticoagulant or antiplatelet                            agents. ASA Grade Assessment: III - A patient with  severe systemic disease. After reviewing the risks                            and benefits, the patient was deemed in                            satisfactory condition to undergo the procedure.                           After obtaining informed consent, the endoscope was                            passed under direct vision. Throughout the                             procedure, the patient's blood pressure, pulse, and                            oxygen saturations were monitored continuously. The                            GIF-H190 ([id number] Olympus gastroscope was                            introduced through the mouth, and advanced to the                            second part of duodenum. The upper GI endoscopy was                            accomplished without difficulty. The patient                            tolerated the procedure well. Scope In: Scope Out: Findings:      Grade II varices were found in the middle third of the esophagus and in       the lower third of the esophagus. They were medium in size. Two bands       were successfully placed with complete eradication, resulting in       deflation of varices. There was no bleeding during and at the end of the       procedure.      There was scarring noted from prior variceal banding.      Multiple small sessile polyps were found in the cardia and in the       gastric fundus.      Few non-bleeding linear and superficial gastric ulcers with pigmented       material were found in the gastric antrum. Biopsies were taken with a       cold forceps for Helicobacter pylori testing. To stop active bleeding,       one hemostatic clip was successfully placed (MR conditional). There was       no bleeding at the end of the procedure.      The examined duodenum was normal. Impression:               -  Grade II esophageal varices. Completely                            eradicated. Banded.                           - Multiple gastric polyps.                           - Non-bleeding gastric ulcers with pigmented                            material. Biopsied. Clip (MR conditional) was                            placed.                           - Normal examined duodenum. Moderate Sedation:      Patient did not receive moderate sedation for this procedure, but       instead received monitored  anesthesia care. Recommendation:           - Patient has a contact number available for                            emergencies. The signs and symptoms of potential                            delayed complications were discussed with the                            patient. Return to normal activities tomorrow.                            Written discharge instructions were provided to the                            patient.                           - Clear liquid diet for 6 hours.                           - Resume regular diet after 6 hours.                           - Continue present medications.                           - Await pathology results.                           - Repeat upper endoscopy in 1 year for retreatment. Procedure Code(s):        --- Professional ---                           [phone], Esophagogastroduodenoscopy, flexible,  transoral; with band ligation of esophageal/gastric                            varices                           43255, 59, Esophagogastroduodenoscopy, flexible,                            transoral; with control of bleeding, any method                           43239, Esophagogastroduodenoscopy, flexible,                            transoral; with biopsy, single or multiple Diagnosis Code(s):        --- Professional ---                           I85.00, Esophageal varices without bleeding                           K31.7, Polyp of stomach and duodenum                           K25.9, Gastric ulcer, unspecified as acute or                            chronic, without hemorrhage or perforation CPT copyright 2019 American Medical Association. All rights reserved. The codes documented in this report are preliminary and upon coder review may  be revised to meet current compliance requirements. [name], MD 08/21/2020 3:06:07 PM This report has been signed electronically. Number of Addenda: 0

## 2020-08-21 NOTE — H&P (Signed)
64 year old female last EGD on 08/16/2019 showed grade 2 esophageal varices, for bands were placed, noted to have portal hypertensive gastropathy.   She has history of portal Hypertension without cirrhosis(liver biopsy 12/17 minimal portal inflammation), she was seen for hematemesis and melena and has had EGDs for variceal banding.        On her last OV she was advised to take propranolol 20 mg BID and         Last colonoscopy from 2016 for screening showed diverticulosis otherwise normal and a repeat was recommended in 10 years.        EGD from 11/27/17 showed grade 3 varices and 7 bands were placed. Repeat EGD was recommended in 6 weeks.        Patient reports doing well, has regular BMs, denies black stools, blood in stool or vomiting blood.        Denies acid reflux, nausea, abdominal pain, early satiety or bloating.        She reports a good appetite.        Denies abdominal distension/swelling or swelling of legs.        Denies confusion or drowsiness. Current Medications  Taking  .Calcium Carbonate 600 MG Tablet 1 tablet Orally Twice a day    .Effexor XR(Venlafaxine HCl ER) 150 MG Capsule Extended Release 24 Hour 1 capsule with food Orally Once a day, Notes: Dr. Jana Hakim    .Femara(Letrozole) 2.5 MG Tablet 1 tablet Orally Once a day, Notes: Dr. Jana Hakim    .Vitamin D3 Maximum Strength(Cholecalciferol) 5000 UNIT Capsule 1 capsule Orally Once a day    .Vitamin E 100 UNIT Capsule 1 capsule Orally Once a day    .Magnesium 500 MG Tablet 1 capsule with a meal Orally Once a day    .Prolia(Denosumab) 60 MG/ML Solution Subcutaneous     .Pantoprazole Sodium 40 MG Tablet Delayed Release 1 tablet Orally Once a day    .Propranolol HCl 20 MG Tablet TAKE 1 TABLET BY MOUTH TWICE A DAY     Medication List reviewed and reconciled with the patient         Past Medical History        Breast CA, right - Stage 4 Dx 07/06/2010.         Lung cancer(metastatic to lung).         Deep  vein thrombosis in left side of neck (think due to her prior port-a-cath).         Lymphedema.         IBD.         Heart murmur.         Blood transfusion - 2012.         Radiation 07/21/2012-09/07/2012.         Leukopenia.         Osteoporosis.         Thrombocytopenia (persistent since chemo).        Surgical History         portacath placement, left side 2011         D&C 2004?         right mastectomy 06/02/2012         portacath removal 06/2014         colonoscopy 02/28/2015         esophageal varicies - [facility] - Dr. Watt Climes. Dr. Earleen Newport 03/2016, 07/2019         egd 08/2017       Family History  Father: deceased 24 yrs, heart disease, diagnosed with Coronary artery disease   Mother: deceased 46 yrs, CABG, diagnosed with Coronary artery disease   Sister 1: alive, polyps   Sister 2: alive   2 sister(s) .    Negative family hx colon cancer, and liver disease.       Social History  General:   Tobacco use      cigarettes: Never smoked    Tobacco history last updated 09/10/2017 no EXPOSURE TO PASSIVE SMOKE.  Alcohol: yes, wine, Rare.  Caffeine: yes, coffee 1 serving daily, , tea daily- unsweet.  no Recreational drug use.  no Exercise.  Marital Status: married.  Children: none.  EDUCATION: Some College.  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  COMMUNICATION BARRIERS: none.        Hospitalization/Major Diagnostic Procedure   Not in the past year 06/2020   [place] procedure - see above 04/17   EGD 08/2017       Review of Systems  GI PROCEDURE:         no Pacemaker/ AICD, no. no Artificial heart valves. no MI/heart attack. no Abnormal heart rhythm. no Angina. no CVA. no Hypertension. no Hypotension. no Asthma, COPD. no Sleep apnea. no Seizure disorders. no Artificial joints. no Severe DJD. no Diabetes. Significant headaches  YES. no Vertigo. Depression/anxiety  YES, depression. no Abnormal bleeding.  no Kidney Disease. no Liver disease, no. no Chance of pregnancy. Blood transfusion  yes, 2012. no Method of Birth Control. no Birth control pills.        Esophageal varices EGD for band ligation.

## 2020-08-21 NOTE — Brief Op Note (Signed)
08/21/2020  3:06 PM  PATIENT:  Nicole [name]  64 y.o. female  PRE-OPERATIVE DIAGNOSIS:  Esophageal varices  POST-OPERATIVE DIAGNOSIS:  antrum biopsy r/o h. pylori, clip placement on antrum,   PROCEDURE:  Procedure(s): ESOPHAGOGASTRODUODENOSCOPY (EGD) WITH PROPOFOL (N/A) GASTRIC VARICES BANDING (N/A) BIOPSY HEMOSTASIS CLIP PLACEMENT  SURGEON:  Surgeon(s) and Role:    [name], MD - Primary  PHYSICIAN ASSISTANT:   ASSISTANTS: Laurin Coder   ANESTHESIA:   MAC  EBL:  2 ml  BLOOD ADMINISTERED:none  DRAINS: none   LOCAL MEDICATIONS USED:  NONE  SPECIMEN:  Biopsy / Limited Resection  DISPOSITION OF SPECIMEN:  PATHOLOGY  COUNTS:  YES  TOURNIQUET:  * No tourniquets in log *  DICTATION: .Dragon Dictation  PLAN OF CARE: Discharge to home after PACU  PATIENT DISPOSITION:  PACU - hemodynamically stable.   Delay start of Pharmacological VTE agent (>24hrs) due to surgical blood loss or risk of bleeding: not applicable

## 2020-08-21 NOTE — Anesthesia Preprocedure Evaluation (Addendum)
Anesthesia Evaluation  Patient identified by MRN, date of birth, ID band Patient awake    Reviewed: Allergy & Precautions, NPO status , Patient's Chart, lab work & pertinent test results, reviewed documented beta blocker date and time   History of Anesthesia Complications Negative for: history of anesthetic complications  Airway Mallampati: II  TM Distance: >3 FB Neck ROM: Full    Dental  (+) Dental Advisory Given, Teeth Intact   Pulmonary neg pulmonary ROS,  08/12/2019 SARS coronavirus NEG   breath sounds clear to auscultation       Cardiovascular hypertension, Pt. on home beta blockers (-) angina+ DVT  Normal cardiovascular exam Rhythm:Regular Rate:Normal     Neuro/Psych  Headaches, PSYCHIATRIC DISORDERS Depression    GI/Hepatic negative GI ROS, (+) Cirrhosis   Esophageal Varices    ,   Endo/Other  negative endocrine ROS  Renal/GU negative Renal ROS     Musculoskeletal  (+) Arthritis , Osteoarthritis,    Abdominal Normal abdominal exam  (+)   Peds  Hematology   Anesthesia Other Findings H/o breast cancer  Reproductive/Obstetrics                            Anesthesia Physical  Anesthesia Plan  ASA: III  Anesthesia Plan: MAC   Post-op Pain Management:    Induction:   PONV Risk Score and Plan: 2 and Treatment may vary due to age or medical condition and Propofol infusion  Airway Management Planned: Natural Airway, Nasal Cannula and Mask  Additional Equipment: None  Intra-op Plan:   Post-operative Plan:   Informed Consent: I have reviewed the patients History and Physical, chart, labs and discussed the procedure including the risks, benefits and alternatives for the proposed anesthesia with the patient or authorized representative who has indicated his/her understanding and acceptance.     Dental advisory given  Plan Discussed with: CRNA  Anesthesia Plan Comments:          Anesthesia Quick Evaluation

## 2020-08-21 NOTE — Discharge Instructions (Signed)

## 2020-08-21 NOTE — Transfer of Care (Signed)
Immediate Anesthesia Transfer of Care Note  Patient: Nicole [name]  Procedure(s) Performed: ESOPHAGOGASTRODUODENOSCOPY (EGD) WITH PROPOFOL (N/A ) GASTRIC VARICES BANDING (N/A ) BIOPSY HEMOSTASIS CLIP PLACEMENT  Patient Location: PACU  Anesthesia Type:MAC  Level of Consciousness: awake and drowsy  Airway & Oxygen Therapy: Patient Spontanous Breathing  Post-op Assessment: Report given to RN and Post -op Vital signs reviewed and stable  Post vital signs: Reviewed and stable  Last Vitals:  Vitals Value Taken Time  BP    Temp    Pulse 70 08/21/20 1507  Resp 14 08/21/20 1507  SpO2 95 % 08/21/20 1507  Vitals shown include unvalidated device data.  Last Pain:  Vitals:   08/21/20 1254  TempSrc: Oral  PainSc: 0-No pain         Complications: No complications documented.

## 2020-08-22 ENCOUNTER — Encounter (HOSPITAL_COMMUNITY): Payer: Self-pay | Admitting: Gastroenterology

## 2020-08-22 ENCOUNTER — Ambulatory Visit
Admission: RE | Admit: 2020-08-22 | Discharge: 2020-08-22 | Disposition: A | Discharge status: Home / Self Care | Payer: Medicare Other | Source: Ambulatory Visit | Attending: Interventional Radiology | Admitting: Interventional Radiology

## 2020-08-22 ENCOUNTER — Ambulatory Visit
Admission: RE | Admit: 2020-08-22 | Discharge: 2020-08-22 | Disposition: A | Discharge status: Home / Self Care | Payer: Medicare Other | Source: Ambulatory Visit | Attending: Family Medicine | Admitting: Family Medicine

## 2020-08-22 ENCOUNTER — Other Ambulatory Visit: Payer: Self-pay

## 2020-08-22 DIAGNOSIS — Z1231 Encounter for screening mammogram for malignant neoplasm of breast: Secondary | ICD-10-CM

## 2020-08-22 DIAGNOSIS — I864 Gastric varices: Secondary | ICD-10-CM

## 2020-08-22 HISTORY — PX: IR RADIOLOGIST EVAL & MGMT: IMG5224

## 2020-08-22 HISTORY — DX: Malignant neoplasm of unspecified site of unspecified female breast: C50.919

## 2020-08-22 LAB — SURGICAL PATHOLOGY

## 2020-08-22 NOTE — Anesthesia Postprocedure Evaluation (Signed)
Anesthesia Post Note  Patient: Nicole [name]  Procedure(s) Performed: ESOPHAGOGASTRODUODENOSCOPY (EGD) WITH PROPOFOL (N/A ) ESOPHAGEAL BANDING (N/A ) BIOPSY HEMOSTASIS CLIP PLACEMENT     Patient location during evaluation: Endoscopy Anesthesia Type: MAC Level of consciousness: awake Pain management: pain level controlled Vital Signs Assessment: post-procedure vital signs reviewed and stable Respiratory status: spontaneous breathing Cardiovascular status: stable Postop Assessment: no apparent nausea or vomiting Anesthetic complications: no   No complications documented.  Last Vitals:  Vitals:   08/21/20 1507 08/21/20 1520  BP: 100/60 114/78  Pulse: 70 63  Resp: 14 19  Temp: 36.5 C   SpO2: 95% 96%    Last Pain:  Vitals:   08/21/20 1520  TempSrc:   PainSc: 0-No pain                 Huston Foley

## 2020-08-22 NOTE — Progress Notes (Signed)
Chief Complaint: Portal HTN, varices  Referring Physician(s): Dr. Laurence Spates  GI: Dr. [name] Hepatology: Nicole [name] PCP: Dr. Milagros Evener Hematology: Dr. Sarajane Jews Magrinat  History of Present Illness: Nicole [name] is a 64 y.o. female presenting as a scheduled follow up to [place] clinic today, SP BRTO for bleeding gastric varices performed at [facility] 04/13/2016.    We had a telemedicine visit today given the [place] situation, and we confirmed her identity with 2 personal identifiers.  Her husband was with her on the call.   She tells me she is doing fine. She denies any GI symptoms.  She has not been hospitalized or needed any ED/urgent care visits. She tells me that she and her husband are both vaccinated with series, and they are very conservative with social distancing techniques.   The last time that we saw her was about 1 year ago 08/25/2019.   Just yesterday she had upper endoscopy performed by Dr. Therisa Doyne, 08/21/20.  She had 2 bands placed on EV's.  She had some biopsies performed.  She was diagnosed with gastric ulcer, and she has started on protonix. There were no GV's observed.    Our last abdominal CT imaging was 08/11/18, with no concerning findings.    Past Medical History:  Diagnosis Date  . Anemia    low iron  . Arthritis   . Blood transfusion 2012  . Breast CA ([place])    breast ca dx 06/2010, stage 4, right , Lung Metasis.Surgery, Chemo, Radiation  . Breast cancer ([place])   . Clot    jugular  ?2015  . Depression   . Esophageal varices ([place])   . Headache    Mirgraine- rare since menopause  . Heart murmur    mild, no cardiologist, from birth  . Hematemesis 08/2017  . History of kidney stones    no pain- shows in CT- small 1 mm bilaterally- kidney  . IBD (inflammatory bowel disease)    resolved  . Idiopathic cirrhosis ([place])    pt denies  . Peripheral vascular disease ([place]) few yrs ago   clot in right jugular  . Portal hypertension ([place])   .  Radiation 07/21/12-09/07/12   5940 cGy  . Restless legs     Past Surgical History:  Procedure Laterality Date  . BIOPSY  08/21/2020   Procedure: BIOPSY;  Surgeon: [name], MD;  Location: [facility] ENDOSCOPY;  Service: Gastroenterology;;  . COLONOSCOPY  2016  . DILATION AND CURETTAGE OF UTERUS  2004?  . ESOPHAGEAL BANDING  09/04/2017   Procedure: ESOPHAGEAL BANDING;  Surgeon: [name], MD;  Location: [facility] ENDOSCOPY;  Service: Gastroenterology;;  . ESOPHAGEAL BANDING  11/27/2017   Procedure: ESOPHAGEAL BANDING;  Surgeon: [name], MD;  Location: [facility] ENDOSCOPY;  Service: Gastroenterology;;  . ESOPHAGEAL BANDING N/A 04/28/2018   Procedure: ESOPHAGEAL BANDING;  Surgeon: [name], MD;  Location: [facility] ENDOS[facility];  Service: Gastroenterology;  Laterality: N/A;  . ESOPHAGEAL BANDING  11/17/2018   Procedure: ESOPHAGEAL BANDING;  Surgeon: [name], MD;  Location: [facility] ENDOS[facility];  Service: Gastroenterology;;  . ESOPHAGEAL BANDING  08/16/2019   Procedure: ESOPHAGEAL BANDING;  Surgeon: [name], MD;  Location: [facility]COPY;  Service: Gastroenterology;;  . ESOPHAGEAL BANDING N/A 08/21/2020   Procedure: ESOPHAGEAL BANDING;  Surgeon: [name], MD;  Location: [facility]COPY;  Service: Gastroenterology;  Laterality: N/A;  . ESOPHAGOGASTRODUODENOSCOPY N/A 04/28/2018   Procedure: ESOPHAGOGASTRODUODENOSCOPY (EGD);  Surgeon: [name], MD;  Location: [facility] ENDOSCOPY;  Service: Gastroenterology;  Laterality:  N/A;  . ESOPHAGOGASTRODUODENOSCOPY N/A 11/17/2018   Procedure: ESOPHAGOGASTRODUODENOSCOPY (EGD);  Surgeon: [name], MD;  Location: [facility];  Service: Gastroenterology;  Laterality: N/A;  . ESOPHAGOGASTRODUODENOSCOPY (EGD) WITH PROPOFOL N/A 04/11/2016   Procedure: ESOPHAGOGASTRODUODENOSCOPY (EGD) WITH PROPOFOL;  Surgeon: Clarene Essex, MD;  Location: [facility];  Service: Endoscopy;  Laterality: N/A;  . ESOPHAGOGASTRODUODENOSCOPY (EGD) WITH PROPOFOL N/A 07/08/2016   Procedure: ESOPHAGOGASTRODUODENOSCOPY  (EGD) WITH PROPOFOL;  Surgeon: Laurence Spates, MD;  Location: [place];  Service: Endoscopy;  Laterality: N/A;  . ESOPHAGOGASTRODUODENOSCOPY (EGD) WITH PROPOFOL N/A 03/03/2017   Procedure: ESOPHAGOGASTRODUODENOSCOPY (EGD) WITH PROPOFOL;  Surgeon: Laurence Spates, MD;  Location: [place];  Service: Endoscopy;  Laterality: N/A;  . ESOPHAGOGASTRODUODENOSCOPY (EGD) WITH PROPOFOL N/A 09/04/2017   Procedure: ESOPHAGOGASTRODUODENOSCOPY (EGD) WITH PROPOFOL;  Surgeon: [name], MD;  Location: [place];  Service: Gastroenterology;  Laterality: N/A;  . ESOPHAGOGASTRODUODENOSCOPY (EGD) WITH PROPOFOL N/A 11/27/2017   Procedure: ESOPHAGOGASTRODUODENOSCOPY (EGD);  Surgeon: [name], MD;  Location: [place];  Service: Gastroenterology;  Laterality: N/A;  . ESOPHAGOGASTRODUODENOSCOPY (EGD) WITH PROPOFOL N/A 08/16/2019   Procedure: ESOPHAGOGASTRODUODENOSCOPY (EGD) WITH PROPOFOL;  Surgeon: [name], MD;  Location: [facility];  Service: Gastroenterology;  Laterality: N/A;  . ESOPHAGOGASTRODUODENOSCOPY (EGD) WITH PROPOFOL N/A 08/21/2020   Procedure: ESOPHAGOGASTRODUODENOSCOPY (EGD) WITH PROPOFOL;  Surgeon: [name], MD;  Location: [facility];  Service: Gastroenterology;  Laterality: N/A;  . HEMOSTASIS CLIP PLACEMENT  08/21/2020   Procedure: HEMOSTASIS CLIP PLACEMENT;  Surgeon: [name], MD;  Location: [facility] ENDOSCOPY;  Service: Gastroenterology;;  . Everlean Alstrom GENERIC HISTORICAL  05/15/2016   IR RADIOLOGIST EVAL & MGMT 05/15/2016 Corrie Mckusick, DO [facility] INTERV RAD  . IR GENERIC HISTORICAL  01/14/2017   IR US GUIDE VASC ACCESS RIGHT 01/14/2017 Greggory Keen, MD [facility]  . IR GENERIC HISTORICAL  01/14/2017   IR VENOGRAM HEPATIC W HEMODYNAMIC EVALUATION 01/14/2017 Greggory Keen, MD [facility]  . IR RADIOLOGIST EVAL & MGMT  06/12/2017  . IR RADIOLOGIST EVAL & MGMT  08/11/2018  . IR RADIOLOGIST EVAL & MGMT  08/25/2019  . MASTECTOMY Right 06/02/12   right breast with lymph node removal  . PORT-A-CATH  REMOVAL    . PORTACATH PLACEMENT  2011   left side  . RADIOLOGY WITH ANESTHESIA N/A 04/13/2016   Procedure: RADIOLOGY WITH ANESTHESIA;  Surgeon: Corrie Mckusick, DO;  Location: [place];  Service: Anesthesiology;  Laterality: N/A;    Allergies: Patient has no known allergies.  Medications: Prior to Admission medications   Medication Sig Start Date End Date Taking? Authorizing Provider  denosumab (PROLIA) 60 MG/ML SOLN injection Inject 60 mg into the skin every 6 (six) months. Administer in upper arm, thigh, or abdomen    [provider]  letrozole (FEMARA) 2.5 MG tablet TAKE 1 TABLET (2.5 MG TOTAL) BY MOUTH AT BEDTIME. 08/16/20   Magrinat, Virgie Dad, MD  Multiple Vitamin (MULTIVITAMIN WITH MINERALS) TABS tablet Take 1 tablet by mouth daily. Centrum Silver    [provider]  pantoprazole (PROTONIX) 40 MG tablet Take 1 tablet (40 mg total) by mouth daily. 08/21/20 10/20/20  [name], MD  propranolol (INDERAL) 20 MG tablet Take 1 tablet (20 mg total) by mouth 2 (two) times daily. 09/07/17   Hongalgi, Lenis Dickinson, MD  venlafaxine XR (EFFEXOR-XR) 150 MG 24 hr capsule TAKE 1 CAPSULE (150 MG TOTAL) BY MOUTH DAILY WITH BREAKFAST. 08/16/20   Gardenia Phlegm, NP     Family History  Problem Relation Age of Onset  . COPD Mother   .  AAA (abdominal aortic aneurysm) Mother   . COPD Father   . Cancer Neg Hx     Social History   Socioeconomic History  . Marital status: Married    Spouse name: Not on file  . Number of children: Not on file  . Years of education: Not on file  . Highest education level: Not on file  Occupational History  . Not on file  Tobacco Use  . Smoking status: Never Smoker  . Smokeless tobacco: Never Used  Vaping Use  . Vaping Use: Never used  Substance and Sexual Activity  . Alcohol use: No  . Drug use: No  . Sexual activity: Yes  Other Topics Concern  . Not on file  Social History Narrative  . Not on file   Social Determinants of Health    Financial Resource Strain:   . Difficulty of Paying Living Expenses: Not on file  Food Insecurity:   . Worried About Charity fundraiser in the Last Year: Not on file  . Ran Out of Food in the Last Year: Not on file  Transportation Needs:   . Lack of Transportation (Medical): Not on file  . Lack of Transportation (Non-Medical): Not on file  Physical Activity:   . Days of Exercise per Week: Not on file  . Minutes of Exercise per Session: Not on file  Stress:   . Feeling of Stress : Not on file  Social Connections:   . Frequency of Communication with Friends and Family: Not on file  . Frequency of Social Gatherings with Friends and Family: Not on file  . Attends Religious Services: Not on file  . Active Member of Clubs or Organizations: Not on file  . Attends Archivist Meetings: Not on file  . Marital Status: Not on file       Review of Systems: A 12 point ROS discussed and pertinent positives are indicated in the HPI above.  All other systems are negative.  Review of Systems  Vital Signs: There were no vitals taken for this visit.  Physical Exam  Mallampati Score:     Imaging: No results found.  Labs:  CBC: Recent Labs    12/13/19 1200 05/22/20 0904 06/12/20 1146  WBC 6.1 6.6 6.7  HGB 14.7 14.7 14.9  HCT 43.6 43.6 44.6  PLT 94* 99* 111*    COAGS: No results for input(s): INR, APTT in the last 8760 hours.  BMP: Recent Labs    12/13/19 1200 05/22/20 0904 06/12/20 1146  NA 141 143 143  K 4.3 4.2 4.2  CL 108 109 108  CO2 21* 24 21*  GLUCOSE 105* 94 100*  BUN 20 16 15   CALCIUM 8.7* 9.0 9.2  CREATININE 1.20* 1.24* 1.23*  GFRNONAA 48* 46* 46*  GFRAA 55* 53* 53*    LIVER FUNCTION TESTS: Recent Labs    12/13/19 1200 05/22/20 0904 06/12/20 1146  BILITOT 0.8 0.7 0.9  AST 25 27 25   ALT 24 28 27   ALKPHOS 66 71 74  PROT 7.0 6.8 6.8  ALBUMIN 4.0 3.8 3.8    TUMOR MARKERS: No results for input(s): AFPTM, CEA, CA199, CHROMGRNA in  the last 8760 hours.  Assessment and Plan:  Nicole [name] is a wonderful 65 yo female with history of portal htn and EV's/GV's, SP BRTO performed with [facility] over 4 years ago, 04/13/2016.    She continues to have annual surveillance endoscopy with Dr. Therisa Doyne, her last yesterday with no GV's, and with treatment  of EV's with 2 bands.   We discussed the need to continue our surveillance, given the known portal HTN.  Although she has never had ascites and she has very stable disease pattern at this time, future treatment with TIPS need is always possibility.    For now, we will plan on continuing our clinic visits, and she can always call us if need be.  We discussed getting onto a staggered pattern of follow up with Dr. Encarnacion Slates follow-up.  Anticipating 1 year with Dr. Therisa Doyne, we will plan on 18 months from now. No imaging required.   Plan: - Office visit with Dr. Earleen Newport in 18 months, no imaging planned.      Electronically Signed: Corrie Mckusick 08/22/2020, 1:08 PM   I spent a total of    25 Minutes in face to face in clinical consultation, greater than 50% of which was counseling/coordinating care for portal HTN, SP BRTO for bleeding GV's

## 2020-10-12 ENCOUNTER — Encounter: Payer: Self-pay | Admitting: *Deleted

## 2020-12-08 ENCOUNTER — Other Ambulatory Visit: Payer: Self-pay | Admitting: *Deleted

## 2020-12-11 ENCOUNTER — Encounter (HOSPITAL_COMMUNITY): Payer: Self-pay

## 2020-12-11 ENCOUNTER — Ambulatory Visit (HOSPITAL_COMMUNITY)
Admission: RE | Admit: 2020-12-11 | Discharge: 2020-12-11 | Disposition: A | Discharge status: Home / Self Care | Payer: Medicare Other | Source: Ambulatory Visit | Attending: Oncology | Admitting: Oncology

## 2020-12-11 ENCOUNTER — Other Ambulatory Visit: Payer: Self-pay

## 2020-12-11 DIAGNOSIS — C50411 Malignant neoplasm of upper-outer quadrant of right female breast: Secondary | ICD-10-CM | POA: Insufficient documentation

## 2020-12-11 DIAGNOSIS — C7800 Secondary malignant neoplasm of unspecified lung: Secondary | ICD-10-CM | POA: Insufficient documentation

## 2020-12-11 DIAGNOSIS — C50919 Malignant neoplasm of unspecified site of unspecified female breast: Secondary | ICD-10-CM | POA: Diagnosis present

## 2020-12-11 DIAGNOSIS — Z170 Estrogen receptor positive status [ER+]: Secondary | ICD-10-CM | POA: Diagnosis present

## 2020-12-11 LAB — POCT I-STAT CREATININE: Creatinine, Ser: 1.1 mg/dL — ABNORMAL HIGH (ref 0.44–1.00)

## 2020-12-11 MED ORDER — SODIUM CHLORIDE (PF) 0.9 % IJ SOLN
INTRAMUSCULAR | Status: AC
  Filled 2020-12-11: qty 50

## 2020-12-11 MED ORDER — IOHEXOL 300 MG/ML  SOLN
75.0000 mL | Freq: Once | INTRAMUSCULAR | Status: AC | PRN
  Administered 2020-12-11: 75 mL via INTRAVENOUS

## 2020-12-11 NOTE — Progress Notes (Signed)
[place]  Telephone:([phone] Fax:(336) [phone]    ID: Nicole [name]   DOB: 1956-11-07  MR#: [id number]  LSL#:[id number]  Patient Care Team: Aretta Nip, MD as PCP - General (Family Medicine) Roosevelt Locks, CRNP as Nurse Practitioner (Nurse Practitioner) Rolm Bookbinder, MD as Consulting Physician (General Surgery) Laurence Spates, MD (Inactive) as Consulting Physician (Gastroenterology) [name], MD as Consulting Physician (Gastroenterology) Abrahm Mancia, Virgie Dad, MD as Consulting Physician (Oncology) OTHER MD:  CHIEF COMPLAINT:  Metastatic Breast Cancer (s/p right mastectomy)  CURRENT TREATMENT: Letrozole, denosumab/Prolia    INTERVAL HISTORY: Nicole [name] was scheduled today for follow-up of her estrogen receptor positive stage IV breast cancer.   Nicole [name] is taking Letrozole daily and is tolerating this well.  Hot flashes and vaginal dryness are not an issue for her.  She obtains the drug at a good price  Her most recent bone density was completed in 10/2018 and consistent with osteopenia with a T score of -2.2 in the left femur.  She receives Prolia every 6 months for this.  She tolerates this well and is aware of the need to take a little extra calcium on the day of treatment to prevent hypocalcemia  Since her last visit, she underwent left screening mammography with tomography at [facility] on 08/22/2020 showing: breast density category C; no evidence of malignancy in either breast.   She also underwent restaging chest CT yesterday, 12/11/2020, showing: unchanged small right pleural effusion; enlarging perifissural nodule within posterior aspect of major fissure measuring 0.9 cm (previously 0.5 cm), cannot exclude early pleural-based metastasis; stable parenchymal nodules; stable nonspecific sclerotic changes within body of sternum; morphologic features of cirrhosis.  Of note, she also underwent EGD on 08/21/2020 under Dr. Therisa Doyne. Pathology from the  procedure [phone]) showed: mild reactive gastropathy with erosions; negative for Heliobacter pylori; no intestinal metaplasia, dysplasia, or malignancy.   REVIEW OF SYSTEMS: Nicole [name] continues to do remarkably well.  She and her husband are pretty much "staying in" because of the pandemic.  She feels tired at times and wants to take a little nap but does not.  She used to walk about 25 to 30 minutes several days a week but has not been doing that lately.  There have been no unusual headaches visual changes nausea or vomiting.  She has a mild tickle in her throat and occasionally coughs a little related to this, but no pleurisy or phlegm production.  There has been no fever.  A detailed review of systems is otherwise negative   COVID 19 VACCINATION STATUS: Status post Pfizer x2+ booster August 2021   BREAST CANCER HISTORY: From Dr. Collier Salina Rubin's 07/25/2010 note:  "This woman has not had any significant medical intervention for some time.  Her last mammogram was about seven years ago.  She noted a right breast mass about a year ago and did not seek immediate medical attention for this.  She ultimately, after some time, admitted this to her husband and self-referred herself for intervention.  She had a mammogram on 07/17/2010 with an ultrasound of the right breast.  This showed a lobulated mass upper right outer quadrant measuring at least 6.3 x 7.3 cm and large right axillary lymph nodes were also noted.  There was skin thickening overlying the mass.  On physical exam, this mass was about an 8 cm with skin dimpling noted. Discolored area in the skin over the mass, fullness in the axilla.  Biopsy was recommended, which took place on 07/17/2010.  Pathology showed invasive ductal cancer involving both lymph node and breast.  The HER-2 was not amplified.  ER and PR both positive at 100% and 6% respectively.  Proliferative index was 12% involving both the lymph node and breast mass.  An MRI scan of both  breasts was performed on 07/22/2010, essentially which showed a large heterogeneously enhancing mass of the right breast with washout kinetics measuring 6.4 x 4.4 x 5.0 cm.  Numerous satellite nodules were seen throughout the dominant mass.  There were several suspicious nodules in the upper central and upper inner quadrant of the right breast.  There were two enhancing subcentimeter nodules seen in the right pectoralis muscle.  No obvious chest wall nodules were seen; however, there was seen some metastatic adenopathy in the mediastinum as well as hila with a 2.4 x 2.0 cm mediastinal lymph node as well as 1.2 x 3.0 cm subcarinal lymph node.  There was a right hilar and probable left hilar adenopathy.  Bilateral pulmonary nodules were seen and a right T2 lesion was seen anterior right liver as well."  Her subsequent history is as detailed below.   PAST MEDICAL HISTORY: Past Medical History:  Diagnosis Date  . Anemia    low iron  . Arthritis   . Blood transfusion 2012  . Breast CA ([place])    breast ca dx 06/2010, stage 4, right , Lung Metasis.Surgery, Chemo, Radiation  . Breast cancer ([place])   . Clot    jugular  ?2015  . Depression   . Esophageal varices ([place])   . Headache    Mirgraine- rare since menopause  . Heart murmur    mild, no cardiologist, from birth  . Hematemesis 08/2017  . History of kidney stones    no pain- shows in CT- small 1 mm bilaterally- kidney  . IBD (inflammatory bowel disease)    resolved  . Idiopathic cirrhosis ([place])    pt denies  . Peripheral vascular disease ([place]) few yrs ago   clot in right jugular  . Portal hypertension ([place])   . Radiation 07/21/12-09/07/12   5940 cGy  . Restless legs     PAST SURGICAL HISTORY: Past Surgical History:  Procedure Laterality Date  . BIOPSY  08/21/2020   Procedure: BIOPSY;  Surgeon: [name], MD;  Location: [facility] ENDOSCOPY;  Service: Gastroenterology;;  . COLONOSCOPY  2016  . DILATION AND CURETTAGE OF UTERUS  2004?  .  ESOPHAGEAL BANDING  09/04/2017   Procedure: ESOPHAGEAL BANDING;  Surgeon: [name], MD;  Location[facility] ENDOSCOPY;  Service: Gastroenterology;;  . ESOPHAGEAL BANDING  11/27/2017   Procedure: ESOPHAGEAL BANDING;  Surgeon: [name], MD;  Location: [facility] ENDOSCOPY;  Service: Gastroenterology;;  . ESOPHAGEAL BANDING N/A 04/28/2018   Procedure: ESOPHAGEAL BANDING;  Surgeon: [name], MD;  Location: [facility] ENDOSCOPY;  Service: Gastroenterology;  Laterality: N/A;  . ESOPHAGEAL BANDING  11/17/2018   Procedure: ESOPHAGEAL BANDING;  Surgeon: [name], MD;  Location: [facility];  Service: Gastroenterology;;  . ESOPHAGEAL BANDING  08/16/2019   Procedure: ESOPHAGEAL BANDING;  Surgeon: [name], MD;  Location: [facility];  Service: Gastroenterology;;  . ESOPHAGEAL BANDING N/A 08/21/2020   Procedure: ESOPHAGEAL BANDING;  Surgeon: [name], MD;  Location: [facility];  Service: Gastroenterology;  Laterality: N/A;  . ESOPHAGOGASTRODUODENOSCOPY N/A 04/28/2018   Procedure: ESOPHAGOGASTRODUODENOSCOPY (EGD);  Surgeon: [name], MD;  Location: [facility] ENDOSCOPY;  Service: Gastroenterology;  Laterality: N/A;  . ESOPHAGOGASTRODUODENOSCOPY N/A 11/17/2018   Procedure: ESOPHAGOGASTRODUODENOSCOPY (EGD);  Surgeon: [name], MD;  Location: [facility];  Service: Gastroenterology;  Laterality: N/A;  . ESOPHAGOGASTRODUODENOSCOPY (EGD) WITH PROPOFOL N/A 04/11/2016   Procedure: ESOPHAGOGASTRODUODENOSCOPY (EGD) WITH PROPOFOL;  Surgeon: Clarene Essex, MD;  Location: [facility];  Service: Endoscopy;  Laterality: N/A;  . ESOPHAGOGASTRODUODENOSCOPY (EGD) WITH PROPOFOL N/A 07/08/2016   Procedure: ESOPHAGOGASTRODUODENOSCOPY (EGD) WITH PROPOFOL;  Surgeon: Laurence Spates, MD;  Location: [place];  Service: Endoscopy;  Laterality: N/A;  . ESOPHAGOGASTRODUODENOSCOPY (EGD) WITH PROPOFOL N/A 03/03/2017   Procedure: ESOPHAGOGASTRODUODENOSCOPY (EGD) WITH PROPOFOL;  Surgeon: Laurence Spates, MD;  Location: [facility] [place];  Service:  Endoscopy;  Laterality: N/A;  . ESOPHAGOGASTRODUODENOSCOPY (EGD) WITH PROPOFOL N/A 09/04/2017   Procedure: ESOPHAGOGASTRODUODENOSCOPY (EGD) WITH PROPOFOL;  Surgeon: [name], MD;  Location: [place];  Service: Gastroenterology;  Laterality: N/A;  . ESOPHAGOGASTRODUODENOSCOPY (EGD) WITH PROPOFOL N/A 11/27/2017   Procedure: ESOPHAGOGASTRODUODENOSCOPY (EGD);  Surgeon: [name], MD;  Location: [facility];  Service: Gastroenterology;  Laterality: N/A;  . ESOPHAGOGASTRODUODENOSCOPY (EGD) WITH PROPOFOL N/A 08/16/2019   Procedure: ESOPHAGOGASTRODUODENOSCOPY (EGD) WITH PROPOFOL;  Surgeon: [name], MD;  [facility];  Service: Gastroenterology;  Laterality: N/A;  . ESOPHAGOGASTRODUODENOSCOPY (EGD) WITH PROPOFOL N/A 08/21/2020   Procedure: ESOPHAGOGASTRODUODENOSCOPY (EGD) WITH PROPOFOL;  Surgeon: [name], MD;  Location: [facility];  Service: Gastroenterology;  Laterality: N/A;  . HEMOSTASIS CLIP PLACEMENT  08/21/2020   Procedure: HEMOSTASIS CLIP PLACEMENT;  Surgeon: [name], MD;  Location: [facility]OS[facility];  Service: Gastroenterology;;  . Everlean Alstrom GENERIC HISTORICAL  05/15/2016   IR RADIOLOGIST EVAL & MGMT 05/15/2016 Corrie Mckusick, DO [facility] INTERV RAD  . IR GENERIC HISTORICAL  01/14/2017   IR US GUIDE VASC ACCESS RIGHT 01/14/2017 Greggory Keen, MD [facility]  . IR GENERIC HISTORICAL  01/14/2017   IR VENOGRAM HEPATIC W HEMODYNAMIC EVALUATION 01/14/2017 Greggory Keen, MD [facility]  . IR RADIOLOGIST EVAL & MGMT  06/12/2017  . IR RADIOLOGIST EVAL & MGMT  08/11/2018  . IR RADIOLOGIST EVAL & MGMT  08/25/2019  . IR RADIOLOGIST EVAL & MGMT  08/22/2020  . MASTECTOMY Right 06/02/12   right breast with lymph node removal  . PORT-A-CATH REMOVAL    . PORTACATH PLACEMENT  2011   left side  . RADIOLOGY WITH ANESTHESIA N/A 04/13/2016   Procedure: RADIOLOGY WITH ANESTHESIA;  Surgeon: Corrie Mckusick, DO;  Location: [place];  Service: Anesthesiology;  Laterality: N/A;    FAMILY HISTORY Family History   Problem Relation Age of Onset  . COPD Mother   . AAA (abdominal aortic aneurysm) Mother   . COPD Father   . Cancer Neg Hx   The patient's father died at the age of 57 from heart disease. Patient's mother died at the age of 41 status post CABG. The patient had no brothers, 2 sisters. There is no history of breast or ovarian cancer in the family.   GYNECOLOGIC HISTORY: Updated May 2018 Menarche age 63, she is GX P0. She does not recall when she went through menopause. She never took hormone replacement.   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 Husband Nicole [name] is retired from [employer]. They have no children and no pets. They attend a local [place].    ADVANCED DIRECTIVES: Not in place   HEALTH MAINTENANCE: Social History   Tobacco Use  . Smoking status: Never Smoker  . Smokeless tobacco: Never Used  Vaping Use  . Vaping Use: Never used  Substance Use Topics  .  Alcohol use: No  . Drug use: No     Colonoscopy: Never  PAP: Remote  Bone density: 09/27/2013, osteoporosis with a T score of -2.8  Lipid panel: Not on file    No Known Allergies  Current Outpatient Medications  Medication Sig Dispense Refill  . denosumab (PROLIA) 60 MG/ML SOLN injection Inject 60 mg into the skin every 6 (six) months. Administer in upper arm, thigh, or abdomen    . letrozole (FEMARA) 2.5 MG tablet TAKE 1 TABLET (2.5 MG TOTAL) BY MOUTH AT BEDTIME. 90 tablet 1  . Multiple Vitamin (MULTIVITAMIN WITH MINERALS) TABS tablet Take 1 tablet by mouth daily. Centrum Silver    . pantoprazole (PROTONIX) 40 MG tablet Take 1 tablet (40 mg total) by mouth daily. 60 tablet 0  . propranolol (INDERAL) 20 MG tablet Take 1 tablet (20 mg total) by mouth 2 (two) times daily. 60 tablet 0  . venlafaxine XR (EFFEXOR-XR) 150 MG 24 hr capsule TAKE 1 CAPSULE (150 MG TOTAL) BY MOUTH DAILY WITH BREAKFAST. 90  capsule 1   No current facility-administered medications for this visit.   Facility-Administered Medications Ordered in Other Visits  Medication Dose Route Frequency Provider Last Rate Last Admin  . denosumab (PROLIA) injection 60 mg  60 mg Subcutaneous Once Kamare Caspers, Virgie Dad, MD        OBJECTIVE: white woman who appears younger than stated age  22:   12/12/20 1146  BP: 119/71  Pulse: 70  Resp: 18  Temp: 98.7 F (37.1 C)  TempSrc: Tympanic  SpO2: 98%  Weight: 144 lb 9.6 oz (65.6 kg)  Height: _0  (1.626 m)    Sclerae unicteric, EOMs intact Wearing a mask No cervical or supraclavicular adenopathy Lungs no rales or rhonchi Heart regular rate and rhythm Abd soft, nontender, positive bowel sounds MSK no focal spinal tenderness, no upper extremity lymphedema Neuro: nonfocal, well oriented, appropriate affect Breasts: The right breast is status post a mastectomy and radiation.  There are significant telangiectasias as previously noted.  There is no evidence of local recurrence.  The left breast and both axillae are benign.   LAB RESULTS:  CMP     Component Value Date/Time   NA 140 12/12/2020 1123   NA 142 11/17/2017 1355   K 4.2 12/12/2020 1123   K 4.4 11/17/2017 1355   CL 107 12/12/2020 1123   CL 109 (H) 04/20/2013 0859   CO2 25 12/12/2020 1123   CO2 22 11/17/2017 1355   GLUCOSE 104 (H) 12/12/2020 1123   GLUCOSE 86 11/17/2017 1355   GLUCOSE 91 04/20/2013 0859   BUN 20 12/12/2020 1123   BUN 12.5 11/17/2017 1355   CREATININE 1.18 (H) 12/12/2020 1123   CREATININE 1.20 (H) 12/13/2019 1200   CREATININE 1.1 11/17/2017 1355   CALCIUM 9.4 12/12/2020 1123   CALCIUM 9.2 11/17/2017 1355   PROT 7.2 12/12/2020 1123   PROT 7.0 11/17/2017 1355   ALBUMIN 3.9 12/12/2020 1123   ALBUMIN 3.8 11/17/2017 1355   AST 25 12/12/2020 1123   AST 25 12/13/2019 1200   AST 23 11/17/2017 1355   ALT 28 12/12/2020 1123   ALT 24 12/13/2019 1200   ALT 22 11/17/2017 1355   ALKPHOS 67  12/12/2020 1123   ALKPHOS 55 11/17/2017 1355   BILITOT 0.9 12/12/2020 1123   BILITOT 0.8 12/13/2019 1200   BILITOT 0.49 11/17/2017 1355   GFRNONAA 51 (L) 12/12/2020 1123   GFRNONAA 48 (L) 12/13/2019 1200   GFRNONAA 53 (L) 05/09/2016 1104  GFRAA 53 (L) 06/12/2020 1146   GFRAA 55 (L) 12/13/2019 1200   GFRAA 61 05/09/2016 1104    No results found for: TOTALPROTELP, ALBUMINELP, A1GS, A2GS, BETS, BETA2SER, GAMS, MSPIKE, SPEI  Lab Results  Component Value Date   WBC 6.6 12/12/2020   NEUTROABS 5.3 12/12/2020   HGB 14.5 12/12/2020   HCT 43.8 12/12/2020   MCV 88.8 12/12/2020   PLT 101 (L) 12/12/2020    Lab Results  Component Value Date   LABCA2 18 08/21/2012    No components found for: [id number]  No results for input(s): INR in the last 168 hours.  Lab Results  Component Value Date   LABCA2 18 08/21/2012    No results found for: CAN199  No results found for: WUJ811  No results found for: BJY782  Lab Results  Component Value Date   CA2729 26.9 12/13/2019    No components found for: HGQUANT  No results found for: CEA1 / No results found for: CEA1   No results found for: AFPTUMOR  No results found for: CHROMOGRNA  No results found for: KPAFRELGTCHN, LAMBDASER, KAPLAMBRATIO (kappa/lambda light chains)  No results found for: HGBA, HGBA2QUANT, HGBFQUANT, HGBSQUAN (Hemoglobinopathy evaluation)   Lab Results  Component Value Date   LDH 195 08/21/2012    Lab Results  Component Value Date   IRON 27 (L) 04/11/2016   TIBC 340 04/11/2016   IRONPCTSAT 8 (L) 04/11/2016   (Iron and TIBC)  Lab Results  Component Value Date   FERRITIN 18 04/11/2016    Urinalysis    Component Value Date/Time   LABSPEC 1.010 04/29/2016 1118   PHURINE 7.5 04/29/2016 1118   GLUCOSEU Negative 04/29/2016 1118   HGBUR Trace 04/29/2016 1118   BILIRUBINUR Negative 04/29/2016 1118   KETONESUR Negative 04/29/2016 1118   PROTEINUR < 30 04/29/2016 1118   UROBILINOGEN 0.2  04/29/2016 1118   NITRITE Negative 04/29/2016 1118   LEUKOCYTESUR Trace 04/29/2016 1118    STUDIES: CT Chest W Contrast  Result Date: 12/11/2020 CLINICAL DATA:  Metastatic breast cancer.  Restaging. EXAM: CT CHEST WITH CONTRAST TECHNIQUE: Multidetector CT imaging of the chest was performed during intravenous contrast administration. CONTRAST:  52m OMNIPAQUE IOHEXOL 300 MG/ML  SOLN COMPARISON:  05/22/2020 FINDINGS: Cardiovascular: Normal heart size. No pericardial effusion. Aortic atherosclerosis. Mediastinum/Nodes: Normal appearance of the thyroid gland. The trachea appears patent and is midline. Normal appearance of the esophagus. No enlarged axillary, supraclavicular, mediastinal, or hilar lymph nodes. Lungs/Pleura: Small right pleural effusion is unchanged from previous exam. No left pleural effusion. Subpleural fibrotic changes are noted overlying the anterior right middle lobe and right upper lobe likely reflecting changes due to external beam radiation to the right chest wall. Left lower lobe lung nodule measures 0.9 cm, image 77/7.  Stable. Right upper lobe lung nodule adjacent to fibrotic changes measures 7 mm, image 57/7. Stable. Peribronchovascular nodule in the right lower lobe is unchanged measuring 1 cm, image 84/7. Stable scattered areas of scarring, architectural distortion and ground-glass attenuation within the right lower lobe, likely postinflammatory. Stable 2 mm nodule within the lateral left upper lobe, image 57/7. Perifissural nodularity within the right lung is again noted. Along the posterior aspect of the major fissure there is an enlarging perifissural nodule measuring 0.9 x 0.4 cm, image 64/6. Previously this measured approximately 0.5 x 0.2 cm. Upper Abdomen: Mild changes of cirrhosis. Small volume of loculated fluid is again noted overlying the anterolateral dome of liver. Posterior right hepatic lobe cyst is unchanged measuring 1.5 cm.  Embolization coils are identified  adjacent to the GE junction. Musculoskeletal: Unchanged superior endplate deformity at L1. Mild thoracic degenerative disc disease. Unchanged sclerotic changes within the body of sternum, image 96/6. Previous right mastectomy. IMPRESSION: 1. Unchanged small right pleural effusion. There is an enlarging perifissural nodule within the posterior aspect of the major fissure measuring 0.9 x 0.4 cm. Previously this measured approximately 0.5 x 0.2 cm. Cannot exclude early pleural based metastasis. Attention on follow-up imaging is advised. 2. Parenchymal nodules are stable in the interval. 3. Stable nonspecific sclerotic changes within the body of sternum. 4. Morphologic features of cirrhosis. 5. Aortic atherosclerosis. Aortic Atherosclerosis (ICD10-I70.0). Electronically Signed   By: Kerby Moors M.D.   On: 12/11/2020 09:21     ASSESSMENT: 64 y.o. [place] woman presenting June 2011 with stage IV breast cancer involving the right breast upper outer quadrant, right axilla, mediastinal lymph nodes, and both lungs, but not the brain, liver, or bones  (1) positive right breast and right axillary lymph node biopsies 07/17/2010 of a clinical T4 N2 M1 invasive ductal carcinoma, grade 1, estrogen receptor 100% positive, progesterone receptor 6% positive, with an MIB-1 of 12%, and no HER-2 amplification.  (2) participated in Phase II tessetaxel study, receiving 2 cycles complicated by thrombocytopenia, anemia requiring transfusion, transaminase elevation, and afebrile neutropenia. Off-study as of September 2011. Chest CT scan September 2011 did show evidence of response.  (3) on letrozole as of October 2011, with continuing response and good tolerance   (4) 06/02/2012 underwent right mastectomy and axillary lymph node sampling (3 lymph nodes removed, all with viable cancer as well as evidence of treatment effect) for a ypT2 ypN1-2 invasive ductal carcinoma, grade 1, 98% estrogen receptor positive, progesterone  receptor negative, with no HER-2 amplification  (5) left jugular vein DVT documented 06/16/2012; left sided Port-A-Cath removed mid July 2015; Coumadin stopped 09/07/2014.  (a) Left brachiocephalic v. slightly narrowed, with some Left lower neck collateralization noted on chest CT December 2015  (6) osteoporosis, was on alendronate, switched to denosumab/Prolia 11/07/2016  (a) bone density in 10/2018 shows osteopenia T score of -2.2  (7) portal hypertension, with splenomegaly, gastric varices, and intestinal edema  (a)  mild persistent leukopenia and thrombocytopenia likely due to splenomegaly  (b) iron deficiency secondary to above, status post Feraheme x2 December 2018  (8) restaging studies:  (a) chest CT 12/26/2017 shows a 4.5 mm left lower lobe pulmonary nodule which is unchanged.  There was no other evidence of active disease  (b) restaging chest CT in 05/2019 shows no change in bilateral pulmonary nodules, prominent mediastinal lymph nodes are stable   PLAN:  Luisa is 10-1/2 years out from initial diagnosis of stage IV breast cancer.  Her disease is very well controlled on letrozole and Prolia, which she is tolerating well.  We discussed the CT of the chest in detail.  The mild cirrhosis that we are seeing is not related to her cancer to the best of her ability to tell.  1 lymph node has increased by 4 mm which is 1/6 of an inch.  This is really not significant.  Overall her disease is very stable.  Accordingly we are not making any changes on her program.  She will receive Prolia today and in 6 months.  She will have a repeat CT of the chest before the next visit 6 months from now.  Likely we will repeat another CT of the chest in December 2022 or we could do a PET scan at  that time depending on results of the earlier CT  If at any point we document definite progression of disease we would switch to fulvestrant and palbociclib.  Total encounter time 25 minutes.Sarajane Jews C.  Tonya Carlile, MD 12/12/20 12:16 PM Medical Oncology and Hematology [facility] [place], [place] [address] Tel. [phone]    Fax. [phone]   I, Wilburn Mylar, am acting as scribe for Dr. Virgie Dad. Domenick Quebedeaux.  I, Lurline Del MD, have reviewed the above documentation for accuracy and completeness, and I agree with the above.   *Total Encounter Time as defined by the Centers for Medicare and Medicaid Services includes, in addition to the face-to-face time of a patient visit (documented in the note above) non-face-to-face time: obtaining and reviewing outside history, ordering and reviewing medications, tests or procedures, care coordination (communications with other health care professionals or caregivers) and documentation in the medical record.

## 2020-12-12 ENCOUNTER — Inpatient Hospital Stay: Payer: Medicare Other

## 2020-12-12 ENCOUNTER — Inpatient Hospital Stay (HOSPITAL_BASED_OUTPATIENT_CLINIC_OR_DEPARTMENT_OTHER): Payer: Medicare Other | Admitting: Oncology

## 2020-12-12 ENCOUNTER — Inpatient Hospital Stay: Payer: Medicare Other | Attending: Oncology

## 2020-12-12 ENCOUNTER — Other Ambulatory Visit: Payer: Self-pay

## 2020-12-12 VITALS — BP 119/71 | HR 70 | Temp 98.7°F | Resp 18 | Ht 64.0 in | Wt 144.6 lb

## 2020-12-12 DIAGNOSIS — C50411 Malignant neoplasm of upper-outer quadrant of right female breast: Secondary | ICD-10-CM | POA: Insufficient documentation

## 2020-12-12 DIAGNOSIS — C773 Secondary and unspecified malignant neoplasm of axilla and upper limb lymph nodes: Secondary | ICD-10-CM | POA: Insufficient documentation

## 2020-12-12 DIAGNOSIS — C50911 Malignant neoplasm of unspecified site of right female breast: Secondary | ICD-10-CM | POA: Diagnosis not present

## 2020-12-12 DIAGNOSIS — I890 Lymphedema, not elsewhere classified: Secondary | ICD-10-CM

## 2020-12-12 DIAGNOSIS — M810 Age-related osteoporosis without current pathological fracture: Secondary | ICD-10-CM

## 2020-12-12 DIAGNOSIS — Z8249 Family history of ischemic heart disease and other diseases of the circulatory system: Secondary | ICD-10-CM | POA: Insufficient documentation

## 2020-12-12 DIAGNOSIS — Z170 Estrogen receptor positive status [ER+]: Secondary | ICD-10-CM | POA: Insufficient documentation

## 2020-12-12 DIAGNOSIS — C771 Secondary and unspecified malignant neoplasm of intrathoracic lymph nodes: Secondary | ICD-10-CM | POA: Insufficient documentation

## 2020-12-12 DIAGNOSIS — Z79899 Other long term (current) drug therapy: Secondary | ICD-10-CM | POA: Diagnosis not present

## 2020-12-12 DIAGNOSIS — Z9011 Acquired absence of right breast and nipple: Secondary | ICD-10-CM | POA: Diagnosis not present

## 2020-12-12 DIAGNOSIS — Z86718 Personal history of other venous thrombosis and embolism: Secondary | ICD-10-CM | POA: Insufficient documentation

## 2020-12-12 DIAGNOSIS — Z923 Personal history of irradiation: Secondary | ICD-10-CM | POA: Insufficient documentation

## 2020-12-12 DIAGNOSIS — Z9221 Personal history of antineoplastic chemotherapy: Secondary | ICD-10-CM | POA: Diagnosis not present

## 2020-12-12 DIAGNOSIS — M8580 Other specified disorders of bone density and structure, unspecified site: Secondary | ICD-10-CM | POA: Diagnosis not present

## 2020-12-12 DIAGNOSIS — K7460 Unspecified cirrhosis of liver: Secondary | ICD-10-CM

## 2020-12-12 DIAGNOSIS — Z79811 Long term (current) use of aromatase inhibitors: Secondary | ICD-10-CM | POA: Insufficient documentation

## 2020-12-12 DIAGNOSIS — C7800 Secondary malignant neoplasm of unspecified lung: Secondary | ICD-10-CM | POA: Diagnosis not present

## 2020-12-12 DIAGNOSIS — C50919 Malignant neoplasm of unspecified site of unspecified female breast: Secondary | ICD-10-CM

## 2020-12-12 LAB — CBC WITH DIFFERENTIAL/PLATELET
Abs Immature Granulocytes: 0.04 10*3/uL (ref 0.00–0.07)
Basophils Absolute: 0 10*3/uL (ref 0.0–0.1)
Basophils Relative: 1 %
Eosinophils Absolute: 0.2 10*3/uL (ref 0.0–0.5)
Eosinophils Relative: 3 %
HCT: 43.8 % (ref 36.0–46.0)
Hemoglobin: 14.5 g/dL (ref 12.0–15.0)
Immature Granulocytes: 1 %
Lymphocytes Relative: 9 %
Lymphs Abs: 0.6 10*3/uL — ABNORMAL LOW (ref 0.7–4.0)
MCH: 29.4 pg (ref 26.0–34.0)
MCHC: 33.1 g/dL (ref 30.0–36.0)
MCV: 88.8 fL (ref 80.0–100.0)
Monocytes Absolute: 0.5 10*3/uL (ref 0.1–1.0)
Monocytes Relative: 7 %
Neutro Abs: 5.3 10*3/uL (ref 1.7–7.7)
Neutrophils Relative %: 79 %
Platelets: 101 10*3/uL — ABNORMAL LOW (ref 150–400)
RBC: 4.93 MIL/uL (ref 3.87–5.11)
RDW: 14.3 % (ref 11.5–15.5)
WBC: 6.6 10*3/uL (ref 4.0–10.5)
nRBC: 0 % (ref 0.0–0.2)

## 2020-12-12 LAB — COMPREHENSIVE METABOLIC PANEL
ALT: 28 U/L (ref 0–44)
AST: 25 U/L (ref 15–41)
Albumin: 3.9 g/dL (ref 3.5–5.0)
Alkaline Phosphatase: 67 U/L (ref 38–126)
Anion gap: 8 (ref 5–15)
BUN: 20 mg/dL (ref 8–23)
CO2: 25 mmol/L (ref 22–32)
Calcium: 9.4 mg/dL (ref 8.9–10.3)
Chloride: 107 mmol/L (ref 98–111)
Creatinine, Ser: 1.18 mg/dL — ABNORMAL HIGH (ref 0.44–1.00)
GFR, Estimated: 51 mL/min — ABNORMAL LOW (ref >60)
Glucose, Bld: 104 mg/dL — ABNORMAL HIGH (ref 70–99)
Potassium: 4.2 mmol/L (ref 3.5–5.1)
Sodium: 140 mmol/L (ref 135–145)
Total Bilirubin: 0.9 mg/dL (ref 0.3–1.2)
Total Protein: 7.2 g/dL (ref 6.5–8.1)

## 2020-12-12 MED ORDER — DENOSUMAB 60 MG/ML ~~LOC~~ SOSY
60.0000 mg | PREFILLED_SYRINGE | Freq: Once | SUBCUTANEOUS | Status: AC
  Administered 2020-12-12: 60 mg via SUBCUTANEOUS

## 2020-12-12 MED ORDER — DENOSUMAB 60 MG/ML ~~LOC~~ SOSY
PREFILLED_SYRINGE | SUBCUTANEOUS | Status: AC
  Filled 2020-12-12: qty 1

## 2020-12-12 MED ORDER — LETROZOLE 2.5 MG PO TABS: 2.5000 mg | ORAL_TABLET | Freq: Every day | ORAL | 4 refills | Status: DC

## 2020-12-12 MED ORDER — VENLAFAXINE HCL ER 150 MG PO CP24: 150.0000 mg | ORAL_CAPSULE | Freq: Every day | ORAL | 1 refills | Status: DC

## 2021-05-10 ENCOUNTER — Other Ambulatory Visit: Payer: Self-pay | Admitting: Oncology

## 2021-05-14 ENCOUNTER — Telehealth: Payer: Self-pay | Admitting: Oncology

## 2021-05-14 NOTE — Telephone Encounter (Signed)
R/s per 12/16 los, pt aware

## 2021-05-16 ENCOUNTER — Inpatient Hospital Stay: Payer: Medicare Other | Attending: Physician Assistant

## 2021-05-16 ENCOUNTER — Inpatient Hospital Stay: Payer: Medicare Other

## 2021-05-16 ENCOUNTER — Inpatient Hospital Stay (HOSPITAL_BASED_OUTPATIENT_CLINIC_OR_DEPARTMENT_OTHER): Payer: Medicare Other | Admitting: Physician Assistant

## 2021-05-16 ENCOUNTER — Other Ambulatory Visit: Payer: Self-pay

## 2021-05-16 ENCOUNTER — Inpatient Hospital Stay: Payer: Medicare Other | Admitting: Adult Health

## 2021-05-16 VITALS — BP 125/79 | HR 79 | Temp 97.9°F | Resp 18 | Ht 64.0 in | Wt 143.0 lb

## 2021-05-16 DIAGNOSIS — C50411 Malignant neoplasm of upper-outer quadrant of right female breast: Secondary | ICD-10-CM | POA: Insufficient documentation

## 2021-05-16 DIAGNOSIS — Z79899 Other long term (current) drug therapy: Secondary | ICD-10-CM | POA: Diagnosis not present

## 2021-05-16 DIAGNOSIS — Z86718 Personal history of other venous thrombosis and embolism: Secondary | ICD-10-CM | POA: Insufficient documentation

## 2021-05-16 DIAGNOSIS — M810 Age-related osteoporosis without current pathological fracture: Secondary | ICD-10-CM

## 2021-05-16 DIAGNOSIS — C50911 Malignant neoplasm of unspecified site of right female breast: Secondary | ICD-10-CM | POA: Diagnosis not present

## 2021-05-16 DIAGNOSIS — C7800 Secondary malignant neoplasm of unspecified lung: Secondary | ICD-10-CM | POA: Diagnosis not present

## 2021-05-16 DIAGNOSIS — C773 Secondary and unspecified malignant neoplasm of axilla and upper limb lymph nodes: Secondary | ICD-10-CM | POA: Insufficient documentation

## 2021-05-16 DIAGNOSIS — Z170 Estrogen receptor positive status [ER+]: Secondary | ICD-10-CM

## 2021-05-16 DIAGNOSIS — C771 Secondary and unspecified malignant neoplasm of intrathoracic lymph nodes: Secondary | ICD-10-CM | POA: Insufficient documentation

## 2021-05-16 DIAGNOSIS — C7801 Secondary malignant neoplasm of right lung: Secondary | ICD-10-CM | POA: Diagnosis not present

## 2021-05-16 DIAGNOSIS — C7802 Secondary malignant neoplasm of left lung: Secondary | ICD-10-CM | POA: Diagnosis not present

## 2021-05-16 DIAGNOSIS — Z79811 Long term (current) use of aromatase inhibitors: Secondary | ICD-10-CM | POA: Diagnosis not present

## 2021-05-16 LAB — CBC WITH DIFFERENTIAL/PLATELET
Abs Immature Granulocytes: 0.07 K/uL (ref 0.00–0.07)
Basophils Absolute: 0.1 K/uL (ref 0.0–0.1)
Basophils Relative: 1 %
Eosinophils Absolute: 0.2 K/uL (ref 0.0–0.5)
Eosinophils Relative: 2 %
HCT: 45.3 % (ref 36.0–46.0)
Hemoglobin: 15.3 g/dL — ABNORMAL HIGH (ref 12.0–15.0)
Immature Granulocytes: 1 %
Lymphocytes Relative: 13 %
Lymphs Abs: 1 K/uL (ref 0.7–4.0)
MCH: 29.8 pg (ref 26.0–34.0)
MCHC: 33.8 g/dL (ref 30.0–36.0)
MCV: 88.1 fL (ref 80.0–100.0)
Monocytes Absolute: 0.6 K/uL (ref 0.1–1.0)
Monocytes Relative: 7 %
Neutro Abs: 6.3 K/uL (ref 1.7–7.7)
Neutrophils Relative %: 76 %
Platelets: 138 K/uL — ABNORMAL LOW (ref 150–400)
RBC: 5.14 MIL/uL — ABNORMAL HIGH (ref 3.87–5.11)
RDW: 14.1 % (ref 11.5–15.5)
WBC: 8.2 K/uL (ref 4.0–10.5)
nRBC: 0 % (ref 0.0–0.2)

## 2021-05-16 LAB — COMPREHENSIVE METABOLIC PANEL WITH GFR
ALT: 29 U/L (ref 0–44)
AST: 29 U/L (ref 15–41)
Albumin: 4.1 g/dL (ref 3.5–5.0)
Alkaline Phosphatase: 72 U/L (ref 38–126)
Anion gap: 12 (ref 5–15)
BUN: 20 mg/dL (ref 8–23)
CO2: 25 mmol/L (ref 22–32)
Calcium: 9.6 mg/dL (ref 8.9–10.3)
Chloride: 106 mmol/L (ref 98–111)
Creatinine, Ser: 1.28 mg/dL — ABNORMAL HIGH (ref 0.44–1.00)
GFR, Estimated: 46 mL/min — ABNORMAL LOW
Glucose, Bld: 89 mg/dL (ref 70–99)
Potassium: 4.4 mmol/L (ref 3.5–5.1)
Sodium: 143 mmol/L (ref 135–145)
Total Bilirubin: 0.9 mg/dL (ref 0.3–1.2)
Total Protein: 7.3 g/dL (ref 6.5–8.1)

## 2021-05-16 MED ORDER — DENOSUMAB 60 MG/ML ~~LOC~~ SOSY
PREFILLED_SYRINGE | SUBCUTANEOUS | Status: AC
  Filled 2021-05-16: qty 1

## 2021-05-16 MED ORDER — DENOSUMAB 60 MG/ML ~~LOC~~ SOSY
60.0000 mg | PREFILLED_SYRINGE | Freq: Once | SUBCUTANEOUS | Status: AC
  Administered 2021-05-16: 60 mg via SUBCUTANEOUS

## 2021-05-16 NOTE — Progress Notes (Signed)
[place]  [phone] [phone]    ID: Nicole [name]   DOB: April 09, 1956  MR#: [id number]  TKW#:[id number]  Patient Care Team: Aretta Nip, MD as PCP - General (Family Medicine) Roosevelt Locks, CRNP as Nurse Practitioner (Nurse Practitioner) Rolm Bookbinder, MD as Consulting Physician (General Surgery) Laurence Spates, MD (Inactive) as Consulting Physician (Gastroenterology) [name], MD as Consulting Physician (Gastroenterology) Magrinat, Virgie Dad, MD as Consulting Physician (Oncology) OTHER MD:  DIAGNOSIS: Stave IV right breast cancer  CURRENT TREATMENT: Letrozole, Denosumab/Prolia   ONCOLOGIC HISTORY: 1) Initially presented with right breast mass for about a year and did not seek immediate medical attention for this.  She ultimately, after some time, admitted this to her husband and self-referred herself for intervention.   2) 07/17/2010: Underwent mammogram and ultrasound of the right breast. Results included a lobulated mass upper right outer quadrant measuring at least 6.3 x 7.3 cm and large right axillary lymph nodes were also noted.  There was skin thickening overlying the mass.  On physical exam, this mass was about an 8 cm with skin dimpling noted. Discolored area in the skin over the mass, fullness in the axilla.  Biopsy was recommended, which took place on 07/17/2010.  Pathology showed invasive ductal cancer involving both lymph node and breast.  The HER-2 was not amplified.  ER and PR both positive at 100% and 6% respectively.  Proliferative index was 12% involving both the lymph node and breast mass.    3) 07/22/2010: MRI scan of both breasts showed a large heterogeneously enhancing mass of the right breast with washout kinetics measuring 6.4 x 4.4 x 5.0 cm.  Numerous satellite nodules were seen throughout the dominant mass.  There were several suspicious nodules in the upper central and upper inner quadrant of the right breast.   There were two enhancing subcentimeter nodules seen in the right pectoralis muscle.  No obvious chest wall nodules were seen; however, there was seen some metastatic adenopathy in the mediastinum as well as hila with a 2.4 x 2.0 cm mediastinal lymph node as well as 1.2 x 3.0 cm subcarinal lymph node.  There was a right hilar and probable left hilar adenopathy.  Bilateral pulmonary nodules were seen and a right T2 lesion was seen anterior right liver as well.  4) Participated in Phase II trial with tesataxel, receiving 2 cycles complicated by thrombocytopenia, anemia requiring transfusion, transaminase elevation and afebrile neutropenia. Off study as of September 2011. Initiated Letrozole (Femara) as of October 2011, with continuing response and good tolerance.   5) 06/02/2012: Underwent right simple mastectomy excision of the axillary mass. with Dr. Rolm Bookbinder. Pathology revealed residual 3.2 cm mass with residual invasive ductal carcinoma, grade 1. There was dermal involvement with tumor but no evidence of dermal lymphatic invasion present. Total of 3 lymph nodes showed metastatic carcinoma from the axillary contents. ypT2 ypN1-2 invasive ductal carcinoma, grade 1, 98% estrogen receptor positive, progesterone receptor negative, with no HER-2 amplification  6) 07/21/2012-09/07/2012: Received adjuvant radiation to right chest wall, high axilla and supraclavicular region 4500 cGy in 25 fractions.  The mastectomy scar region was boosted further to cumulative dose of 5940 cGy   INTERVAL HISTORY: Nicole [name] returns today for a follow up while on Letrozole (Femara) daily and for densoumab injection. She continues to tolerate her therapy without any limitations. She notes being less active since retirement. Patient denies any fatigue and is able to complete her ADLs on her own. She  has a good appetite without any noticeable weight changes. Patient denies any nausea, vomiting or abdominal pain. Her bowel  movements are regular without constipation or diarrhea. She notes to have increased flatulence and suspects it is with certain foods such as potato chips. Patient denies any fevers, chills, night sweats, shortness of breath, chest pain or cough. She has no other complaints.   REVIEW OF SYSTEMS: Review of Systems  Constitutional: Negative for chills, fever and weight loss.  Eyes: Negative for blurred vision.  Respiratory: Negative for cough and shortness of breath.   Cardiovascular: Negative for chest pain and leg swelling.  Gastrointestinal: Negative for abdominal pain, blood in stool, constipation, diarrhea, melena, nausea and vomiting.  Musculoskeletal: Negative for back pain and myalgias.  Skin: Negative for rash.  Neurological: Negative for dizziness, tingling, focal weakness, weakness and headaches.    PAST MEDICAL HISTORY: Past Medical History:  Diagnosis Date  . Anemia    low iron  . Arthritis   . Blood transfusion 2012  . Breast CA ([place])    breast ca dx 06/2010, stage 4, right , Lung Metasis.Surgery, Chemo, Radiation  . Breast cancer ([place])   . Clot    jugular  ?2015  . Depression   . Esophageal varices ([place])   . Headache    Mirgraine- rare since menopause  . Heart murmur    mild, no cardiologist, from birth  . Hematemesis 08/2017  . History of kidney stones    no pain- shows in CT- small 1 mm bilaterally- kidney  . IBD (inflammatory bowel disease)    resolved  . Idiopathic cirrhosis ([place])    pt denies  . Peripheral vascular disease ([place]) few yrs ago   clot in right jugular  . Portal hypertension ([place])   . Radiation 07/21/12-09/07/12   5940 cGy  . Restless legs     PAST SURGICAL HISTORY: Past Surgical History:  Procedure Laterality Date  . BIOPSY  08/21/2020   Procedure: BIOPSY;  Surgeon: [name], MD;  Location: [facility];  Service: Gastroenterology;;  . COLONOSCOPY  2016  . DILATION AND CURETTAGE OF UTERUS  2004?  . ESOPHAGEAL BANDING  09/04/2017    Procedure: ESOPHAGEAL BANDING;  Surgeon: [name], MD;  Location: [facility] ENDOSCOPY;  Service: Gastroenterology;;  . ESOPHAGEAL BANDING  11/27/2017   Procedure: ESOPHAGEAL BANDING;  Surgeon: [name], MD;  Location: [facility] ENDOSCOPY;  Service: Gastroenterology;;  . ESOPHAGEAL BANDING N/A 04/28/2018   Procedure: ESOPHAGEAL BANDING;  Surgeon: [name], MD;  Location: [facility];  Service: Gastroenterology;  Laterality: N/A;  . ESOPHAGEAL BANDING  11/17/2018   Procedure: ESOPHAGEAL BANDING;  Surgeon: [name], MD;  Location: [facility];  Service: Gastroenterology;;  . ESOPHAGEAL BANDING  08/16/2019   Procedure: ESOPHAGEAL BANDING;  Surgeon: [name], MD;  Location: [facility];  Service: Gastroenterology;;  . ESOPHAGEAL BANDING N/A 08/21/2020   Procedure: ESOPHAGEAL BANDING;  Surgeon: [name], MD;  Location: [facility];  Service: Gastroenterology;  Laterality: N/A;  . ESOPHAGOGASTRODUODENOSCOPY N/A 04/28/2018   Procedure: ESOPHAGOGASTRODUODENOSCOPY (EGD);  Surgeon: [name], MD;  Location: [facility];  Service: Gastroenterology;  Laterality: N/A;  . ESOPHAGOGASTRODUODENOSCOPY N/A 11/17/2018   Procedure: ESOPHAGOGASTRODUODENOSCOPY (EGD);  Surgeon: [name], MD;  Location: [facility];  Service: Gastroenterology;  Laterality: N/A;  . ESOPHAGOGASTRODUODENOSCOPY (EGD) WITH PROPOFOL N/A 04/11/2016   Procedure: ESOPHAGOGASTRODUODENOSCOPY (EGD) WITH PROPOFOL;  Surgeon: Clarene Essex, MD;  Location: [facility];  Service: Endoscopy;  Laterality: N/A;  . ESOPHAGOGASTRODUODENOSCOPY (EGD) WITH PROPOFOL N/A 07/08/2016  Procedure: ESOPHAGOGASTRODUODENOSCOPY (EGD) WITH PROPOFOL;  Surgeon: Laurence Spates, MD;  Location: [place];  Service: Endoscopy;  Laterality: N/A;  . ESOPHAGOGASTRODUODENOSCOPY (EGD) WITH PROPOFOL N/A 03/03/2017   Procedure: ESOPHAGOGASTRODUODENOSCOPY (EGD) WITH PROPOFOL;  Surgeon: Laurence Spates, MD;  Location: [place];  Service: Endoscopy;  Laterality: N/A;  .  ESOPHAGOGASTRODUODENOSCOPY (EGD) WITH PROPOFOL N/A 09/04/2017   Procedure: ESOPHAGOGASTRODUODENOSCOPY (EGD) WITH PROPOFOL;  Surgeon: [name], MD;  Location: [place];  Service: Gastroenterology;  Laterality: N/A;  . ESOPHAGOGASTRODUODENOSCOPY (EGD) WITH PROPOFOL N/A 11/27/2017   Procedure: ESOPHAGOGASTRODUODENOSCOPY (EGD);  Surgeon: [name], MD;  Location: [place];  Service: Gastroenterology;  Laterality: N/A;  . ESOPHAGOGASTRODUODENOSCOPY (EGD) WITH PROPOFOL N/A 08/16/2019   Procedure: ESOPHAGOGASTRODUODENOSCOPY (EGD) WITH PROPOFOL;  Surgeon: [name], MD;  [facility];  Service: Gastroenterology;  Laterality: N/A;  . ESOPHAGOGASTRODUODENOSCOPY (EGD) WITH PROPOFOL N/A 08/21/2020   Procedure: ESOPHAGOGASTRODUODENOSCOPY (EGD) WITH PROPOFOL;  Surgeon: [name], MD;  Location: [facility];  Service: Gastroenterology;  Laterality: N/A;  . HEMOSTASIS CLIP PLACEMENT  08/21/2020   Procedure: HEMOSTASIS CLIP PLACEMENT;  Surgeon: [name], MD;  Location: [facility];  Service: Gastroenterology;;  . Everlean Alstrom GENERIC HISTORICAL  05/15/2016   IR RADIOLOGIST EVAL & MGMT 05/15/2016 Corrie Mckusick, DO [facility] INTERV RAD  . IR GENERIC HISTORICAL  01/14/2017   IR US GUIDE VASC ACCESS RIGHT 01/14/2017 Greggory Keen, MD [facility]  . IR GENERIC HISTORICAL  01/14/2017   IR VENOGRAM HEPATIC W HEMODYNAMIC EVALUATION 01/14/2017 Greggory Keen, MD [facility]  . IR RADIOLOGIST EVAL & MGMT  06/12/2017  . IR RADIOLOGIST EVAL & MGMT  08/11/2018  . IR RADIOLOGIST EVAL & MGMT  08/25/2019  . IR RADIOLOGIST EVAL & MGMT  08/22/2020  . MASTECTOMY Right 06/02/12   right breast with lymph node removal  . PORT-A-CATH REMOVAL    . PORTACATH PLACEMENT  2011   left side  . RADIOLOGY WITH ANESTHESIA N/A 04/13/2016   Procedure: RADIOLOGY WITH ANESTHESIA;  Surgeon: Corrie Mckusick, DO;  Location: [place];  Service: Anesthesiology;  Laterality: N/A;    FAMILY HISTORY Family History  Problem Relation Age of Onset  .  COPD Mother   . AAA (abdominal aortic aneurysm) Mother   . COPD Father   . Cancer Neg Hx   The patient's father died at the age of 67 from heart disease. Patient's mother died at the age of 34 status post CABG. The patient had no brothers, 2 sisters. There is no history of breast or ovarian cancer in the family.   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 Husband Merry Proud is retired from [employer]. They have no children and no pets. They attend a local [employer].    HEALTH MAINTENANCE: Social History   Tobacco Use  . Smoking status: Never Smoker  . Smokeless tobacco: Never Used  Vaping Use  . Vaping Use: Never used  Substance Use Topics  . Alcohol use: No  . Drug use: No   Colonoscopy: Never PAP: Remote Bone density: 11/02/2048, osteopenia with a T score of -2.2    No Known Allergies  Current Outpatient Medications  Medication Sig Dispense Refill  . denosumab (PROLIA) 60 MG/ML SOLN injection Inject 60 mg into the skin every 6 (six) months. Administer in upper arm, thigh, or abdomen    . letrozole (FEMARA) 2.5 MG tablet Take 1 tablet (2.5 mg total) by mouth at bedtime. 90 tablet 4  . Multiple Vitamin (  MULTIVITAMIN WITH MINERALS) TABS tablet Take 1 tablet by mouth daily. Centrum Silver    . propranolol (INDERAL) 20 MG tablet Take 1 tablet (20 mg total) by mouth 2 (two) times daily. 60 tablet 0  . venlafaxine XR (EFFEXOR-XR) 150 MG 24 hr capsule Take 1 capsule (150 mg total) by mouth daily with breakfast. 90 capsule 1  . pantoprazole (PROTONIX) 40 MG tablet Take 1 tablet (40 mg total) by mouth daily. 60 tablet 0   No current facility-administered medications for this visit.   Facility-Administered Medications Ordered in Other Visits  Medication Dose Route Frequency Provider Last Rate Last Admin  . denosumab (PROLIA) injection 60 mg  60 mg Subcutaneous Once Magrinat, Virgie Dad, MD         OBJECTIVE:   Vitals:   05/16/21 1306  BP: 125/79  Pulse: 79  Resp: 18  Temp: 97.9 F (36.6 C)  TempSrc: Tympanic  SpO2: 97%  Weight: 143 lb (64.9 kg)  Height: '5\' 4"'  (1.626 m)   GENERAL: well appearing female in NAD  SKIN: skin color, texture, turgor are normal, no rashes or significant lesions EYES: conjunctiva are pink and non-injected, sclera clear OROPHARYNX: no exudate, no erythema; lips, buccal mucosa, and tongue normal  LYMPH:  no palpable lymphadenopathy in the cervical, axillary or supraclavicular lymph nodes.  LUNGS: clear to auscultation and percussion with normal breathing effort HEART: regular rate & rhythm and no murmurs and no lower extremity edema ABDOMEN: soft, non-tender, non-distended, normal bowel sounds Musculoskeletal: no cyanosis of digits and no clubbing  PSYCH: alert & oriented x 3, fluent speech NEURO: no focal motor/sensory deficitst BREASTS: The right breast is status post a mastectomy and radiation with telangiectasias noted.  There is no evidence of local recurrence.  The left breast and both axillae are benign.   LAB RESULTS:  CMP     Component Value Date/Time   NA 143 05/16/2021 1238   NA 142 11/17/2017 1355   K 4.4 05/16/2021 1238   K 4.4 11/17/2017 1355   CL 106 05/16/2021 1238   CL 109 (H) 04/20/2013 0859   CO2 25 05/16/2021 1238   CO2 22 11/17/2017 1355   GLUCOSE 89 05/16/2021 1238   GLUCOSE 86 11/17/2017 1355   GLUCOSE 91 04/20/2013 0859   BUN 20 05/16/2021 1238   BUN 12.5 11/17/2017 1355   CREATININE 1.28 (H) 05/16/2021 1238   CREATININE 1.20 (H) 12/13/2019 1200   CREATININE 1.1 11/17/2017 1355   CALCIUM 9.6 05/16/2021 1238   CALCIUM 9.2 11/17/2017 1355   PROT 7.3 05/16/2021 1238   PROT 7.0 11/17/2017 1355   ALBUMIN 4.1 05/16/2021 1238   ALBUMIN 3.8 11/17/2017 1355   AST 29 05/16/2021 1238   AST 25 12/13/2019 1200   AST 23 11/17/2017 1355   ALT 29 05/16/2021 1238   ALT 24 12/13/2019 1200   ALT 22 11/17/2017 1355    ALKPHOS 72 05/16/2021 1238   ALKPHOS 55 11/17/2017 1355   BILITOT 0.9 05/16/2021 1238   BILITOT 0.8 12/13/2019 1200   BILITOT 0.49 11/17/2017 1355   GFRNONAA 46 (L) 05/16/2021 1238   GFRNONAA 48 (L) 12/13/2019 1200   GFRNONAA 53 (L) 05/09/2016 1104   GFRAA 53 (L) 06/12/2020 1146   GFRAA 55 (L) 12/13/2019 1200   GFRAA 61 05/09/2016 1104    No results found for: TOTALPROTELP, ALBUMINELP, A1GS, A2GS, BETS, BETA2SER, GAMS, MSPIKE, SPEI  Lab Results  Component Value Date   WBC 8.2 05/16/2021   NEUTROABS 6.3 05/16/2021  HGB 15.3 (H) 05/16/2021   HCT 45.3 05/16/2021   MCV 88.1 05/16/2021   PLT 138 (L) 05/16/2021    Lab Results  Component Value Date   LABCA2 18 08/21/2012    No components found for: KAJGOT157  No results for input(s): INR in the last 168 hours.  Lab Results  Component Value Date   LABCA2 18 08/21/2012    No results found for: CAN199  No results found for: WIO035  No results found for: DHR416  Lab Results  Component Value Date   CA2729 26.9 12/13/2019    No components found for: HGQUANT  No results found for: CEA1 / No results found for: CEA1   No results found for: AFPTUMOR  No results found for: CHROMOGRNA  No results found for: KPAFRELGTCHN, LAMBDASER, KAPLAMBRATIO (kappa/lambda light chains)  No results found for: HGBA, HGBA2QUANT, HGBFQUANT, HGBSQUAN (Hemoglobinopathy evaluation)   Lab Results  Component Value Date   LDH 195 08/21/2012    Lab Results  Component Value Date   IRON 27 (L) 04/11/2016   TIBC 340 04/11/2016   IRONPCTSAT 8 (L) 04/11/2016   (Iron and TIBC)  Lab Results  Component Value Date   FERRITIN 18 04/11/2016    Urinalysis    Component Value Date/Time   LABSPEC 1.010 04/29/2016 1118   PHURINE 7.5 04/29/2016 1118   GLUCOSEU Negative 04/29/2016 1118   HGBUR Trace 04/29/2016 1118   BILIRUBINUR Negative 04/29/2016 1118   KETONESUR Negative 04/29/2016 1118   PROTEINUR < 30 04/29/2016 1118    UROBILINOGEN 0.2 04/29/2016 1118   NITRITE Negative 04/29/2016 1118   LEUKOCYTESUR Trace 04/29/2016 1118    STUDIES: No results found.   ASSESSMENT AND PLAN:  HENRI GUEDES is a 65 y.o. female who presents to the clinic for a follow up for stage IV breast cancer involving the right breast upper outer quadrant, right axilla, mediastinal lymph nodes, and both lungs.  Patient is doing well without any significant limitations. Labs from today were reviewed without any intervention needed. Patient is tolerating Letrozole well without any noticeable toxicities. Recommend to continue with Letrozole daily and proceed with denosumab (Prolia) injection today. Patient is scheduled for repeat CT chest next month which Dr. Jana Hakim will follow up to review the results. Next mammogram will be due around August 2022. Patient will return to the clinic in 6 months with labs prior to next denosumab (Prolia) injection.   # Positive right breast and right axillary lymph node and lungs: -Diagnosed 07/17/2010 with clinical stage of T4 N2 M1 invasive ductal carcinoma, grade 1, estrogen receptor 100% positive, progesterone receptor 6% positive, with an MIB-1 of 12%, and no HER-2 amplification. -Participated in Phase II tessetaxel study, receiving 2 cycles complicated by thrombocytopenia, anemia requiring transfusion, transaminase elevation, and afebrile neutropenia. Off-study as of September 2011. Chest CT scan September 2011 did show evidence of response. -On October 2011, initiated Letrozole and continues currently with good response and tolerance  -On 06/02/2012 underwent right mastectomy and axillary lymph node sampling (3 lymph nodes removed, all with viable cancer as well as evidence of treatment effect) for a ypT2 ypN1-2 invasive ductal carcinoma, grade 1, 98% estrogen receptor positive, progesterone receptor negative, with no HER-2 amplification -Developed left jugular vein DVT documented 06/16/2012; left sided  Port-A-Cath removed mid July 2015; Coumadin stopped 09/07/2014. -From 07/21/2012-09/07/2012, received adjuvant radiation to right chest wall, high axilla and supraclavicular region 4500 cGy in 25 fractions.  The mastectomy scar region was boosted further to cumulative dose  of 5940 cGy  # Osteoporosis -Was on alendronate, switched to denosumab/Prolia 11/07/2016 -Most recent bone density in 10/2018 showed osteopenia T score of -2.2 -Currently receives denosumab q 6 months. Due for next injection today.   # Cirrhosis with associated portal hypertension, with splenomegaly, gastric varices, and intestinal edema -Evidence of thrombocytopenia likely due to splenomegaly. Today's platelet count is 138. -Iron deficiency secondary to above, status post Feraheme x2 December 2018 -Continue to monitor.   # Lung nodules: -CT Chest from 12/26/2017 showed a 4.5 mm left lower lobe pulmonary nodule which is unchanged.  There was no other evidence of active disease -Restaging CT chest from 06/03/2019 showed no change in bilateral pulmonary nodules, prominent mediastinal lymph nodes are stable -Restaging CT chest from 05/22/2020 showed stable appearance of bilateral pulmonary nodules. There was new small right pleural effusion and new small volume of fluid overlies the anterolateral dome of the liver.  -Restaging CT chest from 12/11/2020 showed mild increase in perifissural nodule within the posterior aspect of the major fissure measuring 0.9 x 0.4 cm as compared to 0.5 x 0.2 cm. Other parenchymal nodules stable.  -Next CT chest is schedule for 06/04/2021.   All questions were answered. The patient knows to call the clinic with any problems, questions or concerns.  I have spent a total of 35 minutes minutes of face-to-face and non-face-to-face time, preparing to see the patient, obtaining and/or reviewing separately obtained history, performing a medically appropriate examination, counseling and educating the patient,  ordering tests, documenting clinical information in the electronic health record and care coordination.   Dede Query, PA-C Department of Hematology/Oncology [place] at [facility] Phone: [phone]

## 2021-05-16 NOTE — Patient Instructions (Signed)
Denosumab injection What is this medicine? DENOSUMAB (den oh sue mab) slows bone breakdown. Prolia is used to treat osteoporosis in women after menopause and in men, and in people who are taking corticosteroids for 6 months or more. Xgeva is used to treat a high calcium level due to cancer and to prevent bone fractures and other bone problems caused by multiple myeloma or cancer bone metastases. Xgeva is also used to treat giant cell tumor of the bone. This medicine may be used for other purposes; ask your health care provider or pharmacist if you have questions. COMMON BRAND NAME(S): Prolia, XGEVA What should I tell my health care provider before I take this medicine? They need to know if you have any of these conditions:  dental disease  having surgery or tooth extraction  infection  kidney disease  low levels of calcium or Vitamin D in the blood  malnutrition  on hemodialysis  skin conditions or sensitivity  thyroid or parathyroid disease  an unusual reaction to denosumab, other medicines, foods, dyes, or preservatives  pregnant or trying to get pregnant  breast-feeding How should I use this medicine? This medicine is for injection under the skin. It is given by a health care professional in a hospital or clinic setting. A special MedGuide will be given to you before each treatment. Be sure to read this information carefully each time. For Prolia, talk to your pediatrician regarding the use of this medicine in children. Special care may be needed. For Xgeva, talk to your pediatrician regarding the use of this medicine in children. While this drug may be prescribed for children as young as 13 years for selected conditions, precautions do apply. Overdosage: If you think you have taken too much of this medicine contact a poison control center or emergency room at once. NOTE: This medicine is only for you. Do not share this medicine with others. What if I miss a dose? It is  important not to miss your dose. Call your doctor or health care professional if you are unable to keep an appointment. What may interact with this medicine? Do not take this medicine with any of the following medications:  other medicines containing denosumab This medicine may also interact with the following medications:  medicines that lower your chance of fighting infection  steroid medicines like prednisone or cortisone This list may not describe all possible interactions. Give your health care provider a list of all the medicines, herbs, non-prescription drugs, or dietary supplements you use. Also tell them if you smoke, drink alcohol, or use illegal drugs. Some items may interact with your medicine. What should I watch for while using this medicine? Visit your doctor or health care professional for regular checks on your progress. Your doctor or health care professional may order blood tests and other tests to see how you are doing. Call your doctor or health care professional for advice if you get a fever, chills or sore throat, or other symptoms of a cold or flu. Do not treat yourself. This drug may decrease your body's ability to fight infection. Try to avoid being around people who are sick. You should make sure you get enough calcium and vitamin D while you are taking this medicine, unless your doctor tells you not to. Discuss the foods you eat and the vitamins you take with your health care professional. See your dentist regularly. Brush and floss your teeth as directed. Before you have any dental work done, tell your dentist you are   receiving this medicine. Do not become pregnant while taking this medicine or for 5 months after stopping it. Talk with your doctor or health care professional about your birth control options while taking this medicine. Women should inform their doctor if they wish to become pregnant or think they might be pregnant. There is a potential for serious side  effects to an unborn child. Talk to your health care professional or pharmacist for more information. What side effects may I notice from receiving this medicine? Side effects that you should report to your doctor or health care professional as soon as possible:  allergic reactions like skin rash, itching or hives, swelling of the face, lips, or tongue  bone pain  breathing problems  dizziness  jaw pain, especially after dental work  redness, blistering, peeling of the skin  signs and symptoms of infection like fever or chills; cough; sore throat; pain or trouble passing urine  signs of low calcium like fast heartbeat, muscle cramps or muscle pain; pain, tingling, numbness in the hands or feet; seizures  unusual bleeding or bruising  unusually weak or tired Side effects that usually do not require medical attention (report to your doctor or health care professional if they continue or are bothersome):  constipation  diarrhea  headache  joint pain  loss of appetite  muscle pain  runny nose  tiredness  upset stomach This list may not describe all possible side effects. Call your doctor for medical advice about side effects. You may report side effects to FDA at 1-800-FDA-1088. Where should I keep my medicine? This medicine is only given in a clinic, doctor's office, or other health care setting and will not be stored at home. NOTE: This sheet is a summary. It may not cover all possible information. If you have questions about this medicine, talk to your doctor, pharmacist, or health care provider.  2021 Elsevier/Gold Standard (2018-04-24 16:10:44)

## 2021-05-21 ENCOUNTER — Telehealth: Payer: Self-pay | Admitting: Physician Assistant

## 2021-05-21 NOTE — Telephone Encounter (Signed)
Scheduled per los. Called and spoke with patient. Confirmed appt 

## 2021-06-01 ENCOUNTER — Other Ambulatory Visit: Payer: Self-pay | Admitting: Oncology

## 2021-06-01 DIAGNOSIS — C50411 Malignant neoplasm of upper-outer quadrant of right female breast: Secondary | ICD-10-CM

## 2021-06-04 ENCOUNTER — Ambulatory Visit (HOSPITAL_COMMUNITY)
Admission: RE | Admit: 2021-06-04 | Discharge: 2021-06-04 | Disposition: A | Discharge status: Home / Self Care | Payer: Medicare Other | Source: Ambulatory Visit | Attending: Oncology | Admitting: Oncology

## 2021-06-04 ENCOUNTER — Encounter (HOSPITAL_COMMUNITY): Payer: Self-pay

## 2021-06-04 ENCOUNTER — Other Ambulatory Visit: Payer: Self-pay

## 2021-06-04 DIAGNOSIS — Z170 Estrogen receptor positive status [ER+]: Secondary | ICD-10-CM | POA: Insufficient documentation

## 2021-06-04 DIAGNOSIS — I890 Lymphedema, not elsewhere classified: Secondary | ICD-10-CM | POA: Diagnosis present

## 2021-06-04 DIAGNOSIS — C50911 Malignant neoplasm of unspecified site of right female breast: Secondary | ICD-10-CM | POA: Insufficient documentation

## 2021-06-04 DIAGNOSIS — K7460 Unspecified cirrhosis of liver: Secondary | ICD-10-CM | POA: Diagnosis present

## 2021-06-04 DIAGNOSIS — C50411 Malignant neoplasm of upper-outer quadrant of right female breast: Secondary | ICD-10-CM | POA: Insufficient documentation

## 2021-06-04 DIAGNOSIS — C7800 Secondary malignant neoplasm of unspecified lung: Secondary | ICD-10-CM | POA: Insufficient documentation

## 2021-06-04 MED ORDER — IOHEXOL 300 MG/ML  SOLN
75.0000 mL | Freq: Once | INTRAMUSCULAR | Status: AC | PRN
  Administered 2021-06-04: 75 mL via INTRAVENOUS

## 2021-06-04 MED ORDER — SODIUM CHLORIDE (PF) 0.9 % IJ SOLN
INTRAMUSCULAR | Status: AC
  Filled 2021-06-04: qty 50

## 2021-06-13 ENCOUNTER — Other Ambulatory Visit: Payer: Self-pay | Admitting: Oncology

## 2021-08-15 ENCOUNTER — Other Ambulatory Visit: Payer: Self-pay | Admitting: Family Medicine

## 2021-08-15 DIAGNOSIS — Z1231 Encounter for screening mammogram for malignant neoplasm of breast: Secondary | ICD-10-CM

## 2021-08-15 IMAGING — CT CT CHEST W/ CM
2 of 4 series · 15 of 36 positions shown, 18 images · IV contrast (omnipaque)
Comparison: 12/11/2020.

CLINICAL DATA: Breast cancer, chemo radiation complete, [facility]
therapy and progress.

EXAM:
CT CHEST WITH CONTRAST
TECHNIQUE: Multidetector CT imaging of the chest was performed during
intravenous contrast administration.
CONTRAST:  75mL OMNIPAQUE IOHEXOL 300 MG/ML  SOLN

[Series 2: axial st · axial · 0.64mm/px · z∈[-298,-34]mm · 12 of 155 slices shown, 15 images]
[im 12/155  mediastinal]
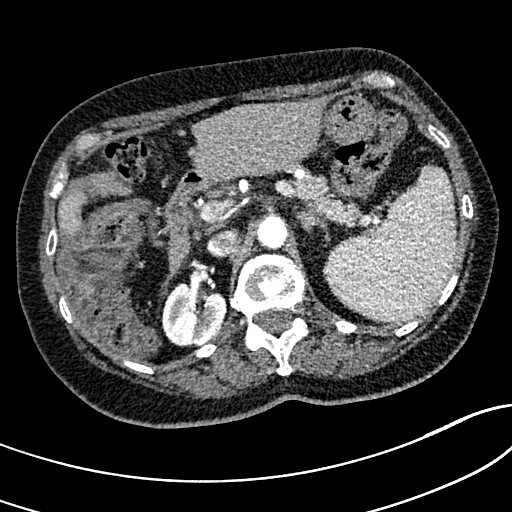
[im 12/155  lung]
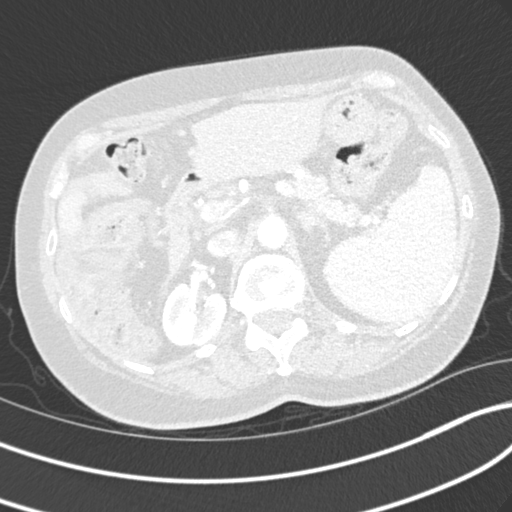
[im 23/155  lung]
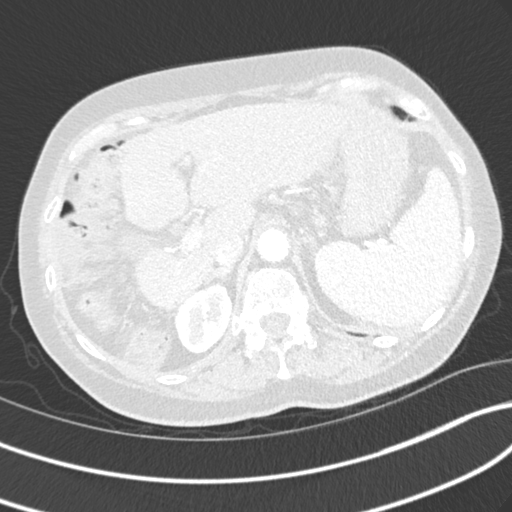
[im 34/155  lung]
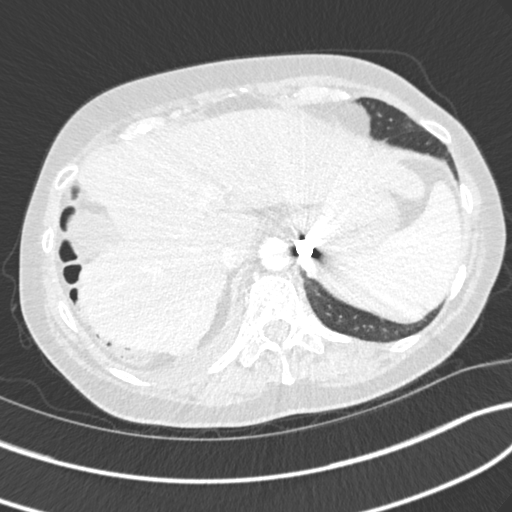
[im 45/155  lung]
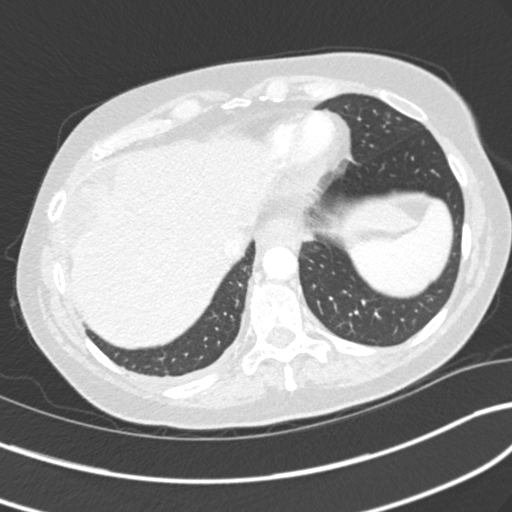
[im 56/155  mediastinal]
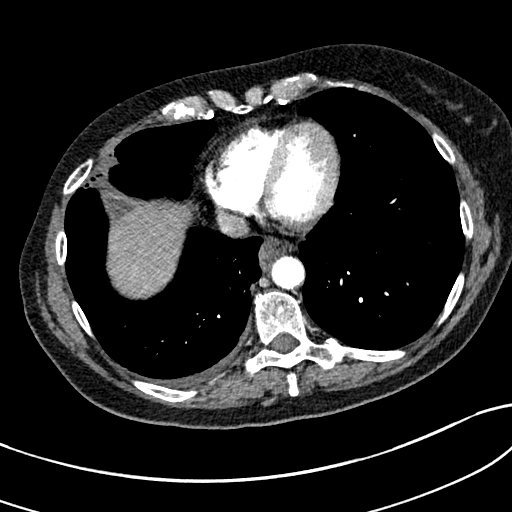
[im 56/155  lung]
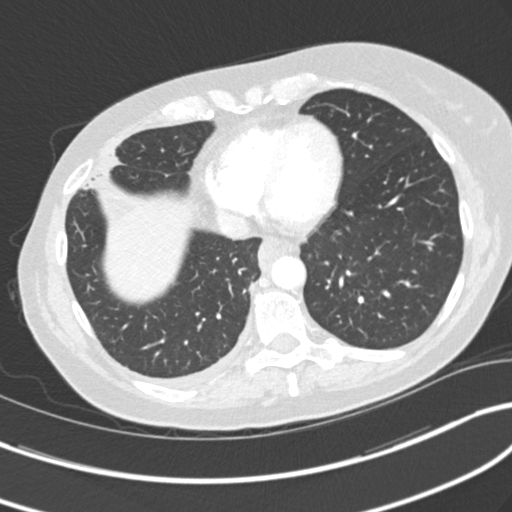
[im 67/155  lung]
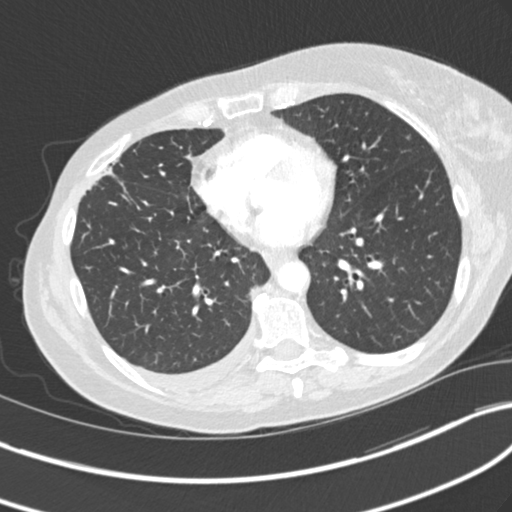
[im 89/155  lung]
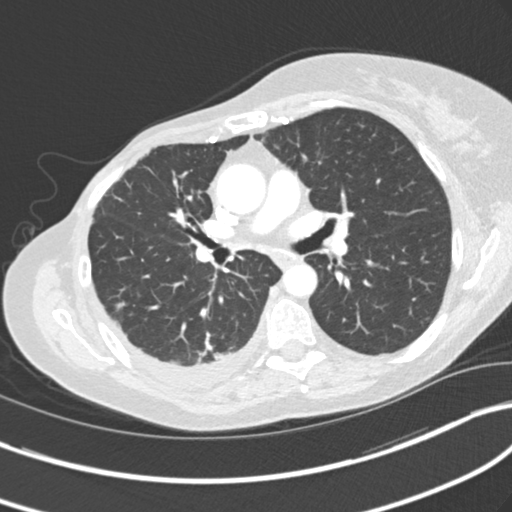
[im 100/155  lung]
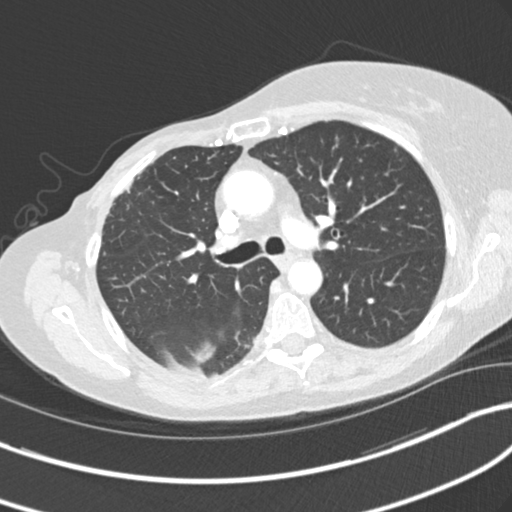
[im 111/155  mediastinal]
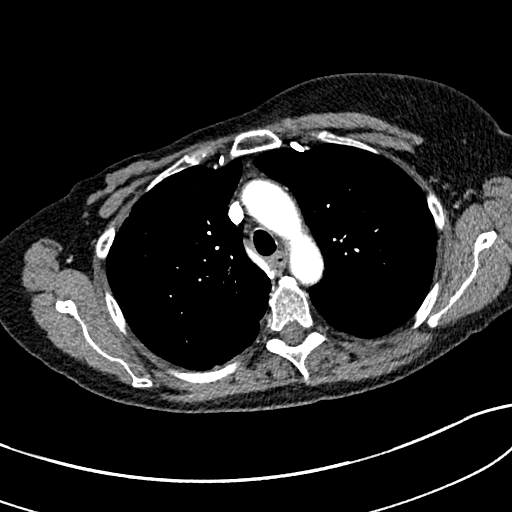
[im 111/155  lung]
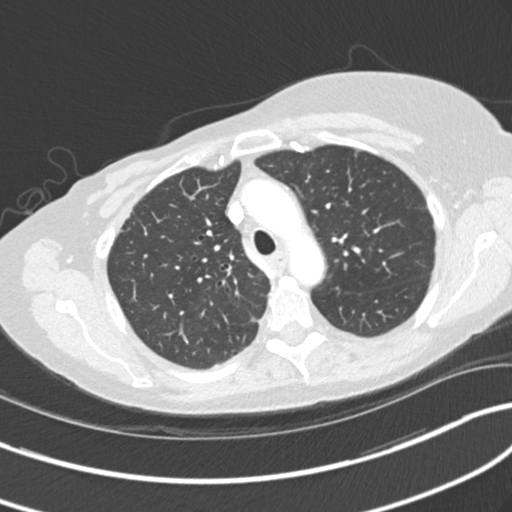
[im 122/155  lung]
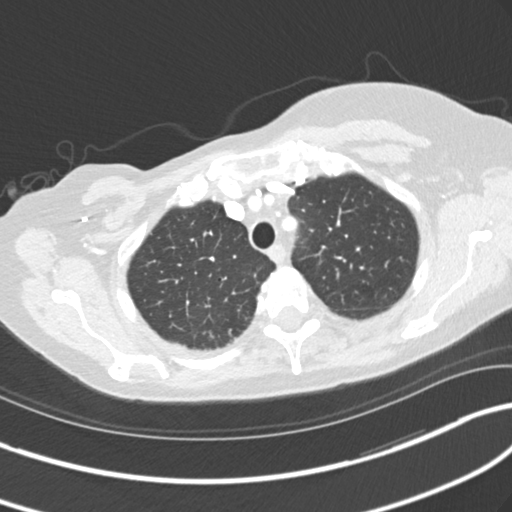
[im 133/155  lung]
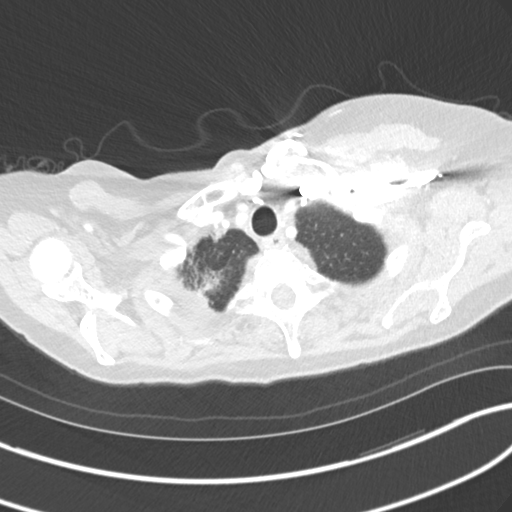
[im 144/155  lung]
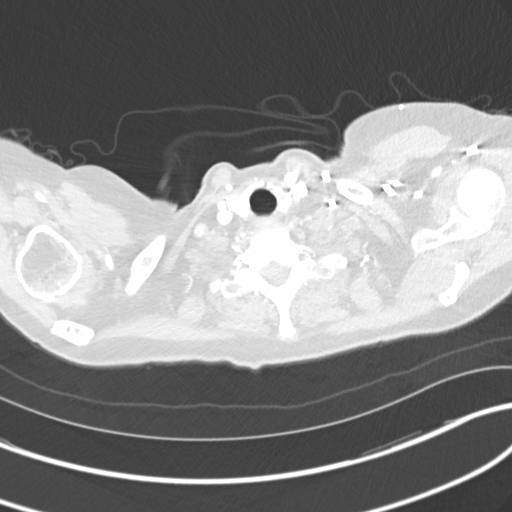

[Series 6: coronal · coronal · 0.64mm/px · 3 of 123 slices shown]
[im 25/123  lung]
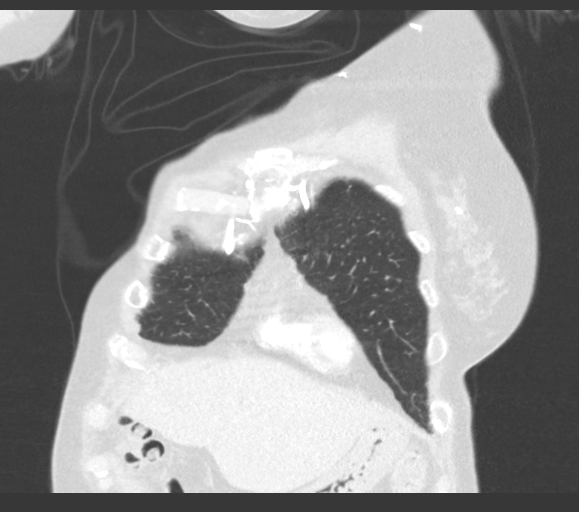
[im 49/123  lung]
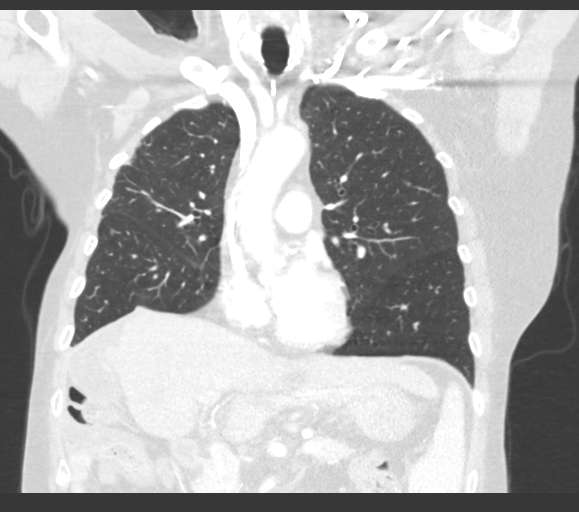
[im 74/123  lung]
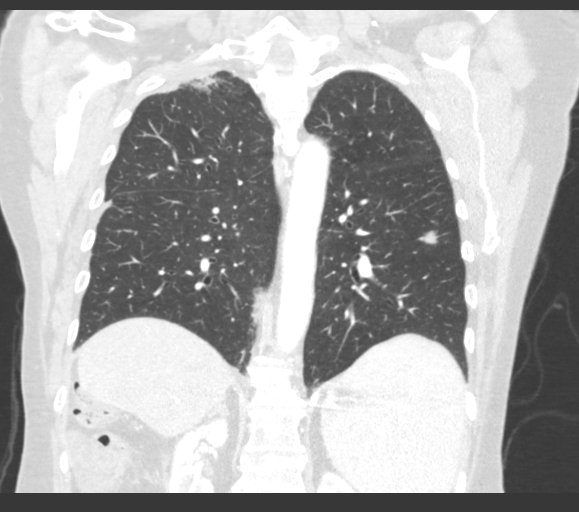

[15 of 36 positions shown; findings below may reference images not displayed]

FINDINGS: Cardiovascular: Heart size normal.  No pericardial effusion.

Mediastinum/Nodes: Mediastinal lymph nodes are not enlarged by CT
size criteria. No hilar, internal mammary or axillary lymph nodes.
There may be distal esophageal wall thickening which can be seen
with gastroesophageal reflux.

Lungs/Pleura: Post radiation scarring in the apical segment right
upper lobe as well as in the subpleural anterior right lung. Slight
nodularity along the superior aspect of the right major fissure,
unchanged. Small right pleural effusion appears minimally larger.
Patchy peribronchovascular ground-glass in the right lower lobe,
similar. Irregular 10 mm nodule in the left lower lobe (5/74),
stable to minimally larger, previously measuring 9 mm. No left
pleural fluid. Airway is unremarkable.

Upper Abdomen: 1.4 cm low-attenuation lesion in the posterior right
hepatic lobe, unchanged. Small collection of subcapsular fluid along
the dome of the liver, similar. Visualized portions of the liver,
gallbladder adrenal glands are otherwise unremarkable. A small stone
is seen in the right kidney. Renal cortical scarring on the right.
Visualized portions of the kidneys, spleen, pancreas, stomach and
bowel are otherwise unremarkable. No upper abdominal adenopathy.
Embolization coils are seen in the region of the gastroesophageal
junction.

Musculoskeletal: Degenerative changes in the spine. No worrisome
lytic or sclerotic lesions. Right mastectomy. Remote L1 compression
fracture. Prominent vascularity involving the sternum simulates
sclerosis.
IMPRESSION: 1. Nodularity along the right major fissure is stable. Left lower
lobe nodule is stable to minimally enlarged.
2. Small right pleural effusion, minimally enlarged. Adjacent
peribronchovascular ground-glass in the right lower lobe, similar.
3. Small collection of subcapsular fluid along the dome of the
liver, unchanged.
4. Small right renal stone.

## 2021-08-22 ENCOUNTER — Other Ambulatory Visit: Payer: Self-pay | Admitting: Gastroenterology

## 2021-08-23 ENCOUNTER — Other Ambulatory Visit: Payer: Self-pay

## 2021-08-23 ENCOUNTER — Encounter (HOSPITAL_COMMUNITY): Payer: Self-pay | Admitting: Gastroenterology

## 2021-08-24 ENCOUNTER — Ambulatory Visit (HOSPITAL_COMMUNITY): Payer: Medicare Other | Admitting: Anesthesiology

## 2021-08-24 ENCOUNTER — Encounter (HOSPITAL_COMMUNITY)
Admission: RE | Disposition: A | Discharge status: Home / Self Care | Payer: Self-pay | Source: Home / Self Care | Attending: Gastroenterology

## 2021-08-24 ENCOUNTER — Ambulatory Visit (HOSPITAL_COMMUNITY)
Admission: RE | Admit: 2021-08-24 | Discharge: 2021-08-24 | Disposition: A | Discharge status: Home / Self Care | Payer: Medicare Other | Attending: Gastroenterology | Admitting: Gastroenterology

## 2021-08-24 ENCOUNTER — Other Ambulatory Visit: Payer: Self-pay

## 2021-08-24 ENCOUNTER — Encounter (HOSPITAL_COMMUNITY): Payer: Self-pay | Admitting: Gastroenterology

## 2021-08-24 DIAGNOSIS — K7469 Other cirrhosis of liver: Secondary | ICD-10-CM | POA: Insufficient documentation

## 2021-08-24 DIAGNOSIS — K766 Portal hypertension: Secondary | ICD-10-CM | POA: Diagnosis not present

## 2021-08-24 DIAGNOSIS — I8510 Secondary esophageal varices without bleeding: Secondary | ICD-10-CM | POA: Diagnosis not present

## 2021-08-24 DIAGNOSIS — Z8249 Family history of ischemic heart disease and other diseases of the circulatory system: Secondary | ICD-10-CM | POA: Insufficient documentation

## 2021-08-24 DIAGNOSIS — Z79899 Other long term (current) drug therapy: Secondary | ICD-10-CM | POA: Diagnosis not present

## 2021-08-24 DIAGNOSIS — Z853 Personal history of malignant neoplasm of breast: Secondary | ICD-10-CM | POA: Insufficient documentation

## 2021-08-24 DIAGNOSIS — I8500 Esophageal varices without bleeding: Secondary | ICD-10-CM | POA: Diagnosis present

## 2021-08-24 DIAGNOSIS — K3189 Other diseases of stomach and duodenum: Secondary | ICD-10-CM | POA: Insufficient documentation

## 2021-08-24 DIAGNOSIS — Z825 Family history of asthma and other chronic lower respiratory diseases: Secondary | ICD-10-CM | POA: Insufficient documentation

## 2021-08-24 HISTORY — DX: Essential (primary) hypertension: I10

## 2021-08-24 HISTORY — PX: GASTRIC VARICES BANDING: SHX5519

## 2021-08-24 HISTORY — PX: ESOPHAGOGASTRODUODENOSCOPY (EGD) WITH PROPOFOL: SHX5813

## 2021-08-24 SURGERY — ESOPHAGOGASTRODUODENOSCOPY (EGD) WITH PROPOFOL
Anesthesia: Monitor Anesthesia Care

## 2021-08-24 MED ORDER — LIDOCAINE 2% (20 MG/ML) 5 ML SYRINGE
INTRAMUSCULAR | Status: DC | PRN
  Administered 2021-08-24: 40 mg via INTRAVENOUS

## 2021-08-24 MED ORDER — PROPOFOL 10 MG/ML IV BOLUS
INTRAVENOUS | Status: DC | PRN
  Administered 2021-08-24 (×8): 20 mg via INTRAVENOUS

## 2021-08-24 MED ORDER — PHENYLEPHRINE 40 MCG/ML (10ML) SYRINGE FOR IV PUSH (FOR BLOOD PRESSURE SUPPORT)
PREFILLED_SYRINGE | INTRAVENOUS | Status: DC | PRN
  Administered 2021-08-24: 120 ug via INTRAVENOUS

## 2021-08-24 MED ORDER — LACTATED RINGERS IV SOLN
INTRAVENOUS | Status: DC
  Administered 2021-08-24: 1000 mL via INTRAVENOUS

## 2021-08-24 SURGICAL SUPPLY — 14 items

## 2021-08-24 NOTE — Op Note (Signed)
[facility] Patient Name: Nicole [name] Procedure Date: 08/24/2021 MRN: [id number] Attending MD: [name] , MD Date of Birth: 1956-11-27 CSN: [id number] Age: 65 Admit Type: Outpatient Procedure:                Upper GI endoscopy Indications:              For therapy of esophageal varices Providers:                [name], MD, Grace Isaac, RN, Tyna Jaksch                            Technician Referring MD:             Jordan Hawks Rankins,MD Medicines:                Monitored Anesthesia Care Complications:            No immediate complications. Estimated Blood Loss:     Estimated blood loss: none. Procedure:                Pre-Anesthesia Assessment:                           - Prior to the procedure, a History and Physical                            was performed, and patient medications and                            allergies were reviewed. The patient's tolerance of                            previous anesthesia was also reviewed. The risks                            and benefits of the procedure and the sedation                            options and risks were discussed with the patient.                            All questions were answered, and informed consent                            was obtained. Prior Anticoagulants: The patient has                            taken no previous anticoagulant or antiplatelet                            agents. ASA Grade Assessment: III - A patient with                            severe systemic disease. After reviewing the risks  and benefits, the patient was deemed in                            satisfactory condition to undergo the procedure.                           After obtaining informed consent, the endoscope was                            passed under direct vision. Throughout the                            procedure, the patient's blood pressure, pulse, and                             oxygen saturations were monitored continuously. The                            [id number] Olympus endoscope was introduced                            through the mouth, and advanced to the second part                            of duodenum. The upper GI endoscopy was                            accomplished without difficulty. The patient                            tolerated the procedure well. Scope In: Scope Out: Findings:      The upper third of the esophagus was normal.      Grade II varices were found in the middle third of the esophagus and in       the lower third of the esophagus. They were medium in size. Four bands       were successfully placed with complete eradication, resulting in       deflation of varices. There was no bleeding during and at the end of the       procedure.      Diffuse mildly erythematous mucosa without bleeding was found in the       gastric body.      The cardia and gastric fundus were normal on retroflexion.      Mild portal hypertensive gastropathy was found in the entire examined       stomach.      The examined duodenum was normal. Impression:               - Normal upper third of esophagus.                           - Grade II esophageal varices. Completely                            eradicated. Banded.                           -  Erythematous mucosa in the gastric body.                           - Portal hypertensive gastropathy.                           - Normal examined duodenum.                           - No specimens collected. Moderate Sedation:      Patient did not receive moderate sedation for this procedure, but       instead received monitored anesthesia care. Recommendation:           - Patient has a contact number available for                            emergencies. The signs and symptoms of potential                            delayed complications were discussed with the                            patient. Return to  normal activities tomorrow.                            Written discharge instructions were provided to the                            patient.                           - Clear liquid diet for 6 hours.                           - Advance diet as tolerated thereafter.                           - Repeat upper endoscopy in 1 year for endoscopic                            band ligation. Procedure Code(s):        --- Professional ---                           [phone], Esophagogastroduodenoscopy, flexible,                            transoral; with band ligation of esophageal/gastric                            varices Diagnosis Code(s):        --- Professional ---                           I85.00, Esophageal varices without bleeding  K31.89, Other diseases of stomach and duodenum                           K76.6, Portal hypertension CPT copyright 2019 American Medical Association. All rights reserved. The codes documented in this report are preliminary and upon coder review may  be revised to meet current compliance requirements. [name], MD 08/24/2021 10:29:57 AM This report has been signed electronically. Number of Addenda: 0

## 2021-08-24 NOTE — Anesthesia Preprocedure Evaluation (Addendum)
Anesthesia Evaluation  Patient identified by MRN, date of birth, ID band Patient awake    Reviewed: Allergy & Precautions, NPO status , Patient's Chart, lab work & pertinent test results  Airway Mallampati: II  TM Distance: >3 FB Neck ROM: Full    Dental no notable dental hx.    Pulmonary neg pulmonary ROS,    Pulmonary exam normal breath sounds clear to auscultation       Cardiovascular hypertension, Pt. on home beta blockers + Peripheral Vascular Disease and + DVT  Normal cardiovascular exam Rhythm:Regular Rate:Normal     Neuro/Psych  Headaches, PSYCHIATRIC DISORDERS Depression    GI/Hepatic negative GI ROS, (+) Cirrhosis   Esophageal Varices    , Hepatitis -  Endo/Other  negative endocrine ROS  Renal/GU negative Renal ROS     Musculoskeletal  (+) Arthritis ,   Abdominal   Peds  Hematology   Anesthesia Other Findings History of esophageal varices  Reproductive/Obstetrics                            Anesthesia Physical Anesthesia Plan  ASA: 3  Anesthesia Plan: MAC   Post-op Pain Management:    Induction: Intravenous  PONV Risk Score and Plan: 2 and Propofol infusion and Treatment may vary due to age or medical condition  Airway Management Planned: Nasal Cannula  Additional Equipment:   Intra-op Plan:   Post-operative Plan:   Informed Consent: I have reviewed the patients History and Physical, chart, labs and discussed the procedure including the risks, benefits and alternatives for the proposed anesthesia with the patient or authorized representative who has indicated his/her understanding and acceptance.     Dental advisory given  Plan Discussed with: CRNA  Anesthesia Plan Comments:        Anesthesia Quick Evaluation

## 2021-08-24 NOTE — H&P (Signed)
Nicole [name] is an 65 y.o. female.   Chief Complaint: Therapy of esophageal varices HPI: 65 year old female with portal hypertension, esophageal varices, last EGD on 8/21, grade 2 varices noted, 2 bands placed.  Past Medical History:  Diagnosis Date   Anemia    low iron   Arthritis    Blood transfusion 2012   Breast CA ([place])    breast ca dx 06/2010, stage 4, right , Lung Metasis.Surgery, Chemo, Radiation   Breast cancer ([place])    Clot    jugular  ?2015   Depression    Esophageal varices ([place])    Headache    Mirgraine- rare since menopause   Heart murmur    mild, no cardiologist, from birth   Hematemesis 08/2017   History of kidney stones    no pain- shows in CT- small 1 mm bilaterally- kidney   Hypertension    IBD (inflammatory bowel disease)    resolved   Idiopathic cirrhosis ([place])    pt denies   Peripheral vascular disease ([place]) few yrs ago   clot in right jugular   Portal hypertension ([place])    Radiation 07/21/12-09/07/12   5940 cGy   Restless legs     Past Surgical History:  Procedure Laterality Date   BIOPSY  08/21/2020   Procedure: BIOPSY;  Surgeon: [name], MD;  Location: [facility];  Service: Gastroenterology;;   COLONOSCOPY  2016   DILATION AND CURETTAGE OF UTERUS  2004?   ESOPHAGEAL BANDING  09/04/2017   Procedure: ESOPHAGEAL BANDING;  Surgeon: [name], MD;  Location: [facility] ENDOSCOPY;  Service: Gastroenterology;;   ESOPHAGEAL BANDING  11/27/2017   Procedure: ESOPHAGEAL BANDING;  Surgeon: [name], MD;  Location: [place];  Service: Gastroenterology;;   ESOPHAGEAL BANDING N/A 04/28/2018   Procedure: ESOPHAGEAL BANDING;  Surgeon: [name], MD;  Location: [facility];  Service: Gastroenterology;  Laterality: N/A;   ESOPHAGEAL BANDING  11/17/2018   Procedure: ESOPHAGEAL BANDING;  Surgeon: [name], MD;  Location: [facility];  Service: Gastroenterology;;   ESOPHAGEAL BANDING  08/16/2019   Procedure: ESOPHAGEAL BANDING;  Surgeon: [name], MD;   Location: [facility];  Service: Gastroenterology;;   ESOPHAGEAL BANDING N/A 08/21/2020   Procedure: ESOPHAGEAL BANDING;  Surgeon: [name], MD;  Location: [facility];  Service: Gastroenterology;  Laterality: N/A;   ESOPHAGOGASTRODUODENOSCOPY N/A 04/28/2018   Procedure: ESOPHAGOGASTRODUODENOSCOPY (EGD);  Surgeon: [name], MD;  Location: [facility];  Service: Gastroenterology;  Laterality: N/A;   ESOPHAGOGASTRODUODENOSCOPY N/A 11/17/2018   Procedure: ESOPHAGOGASTRODUODENOSCOPY (EGD);  Surgeon: [name], MD;  Location: [facility];  Service: Gastroenterology;  Laterality: N/A;   ESOPHAGOGASTRODUODENOSCOPY (EGD) WITH PROPOFOL N/A 04/11/2016   Procedure: ESOPHAGOGASTRODUODENOSCOPY (EGD) WITH PROPOFOL;  Surgeon: Clarene Essex, MD;  Location: [facility];  Service: Endoscopy;  Laterality: N/A;   ESOPHAGOGASTRODUODENOSCOPY (EGD) WITH PROPOFOL N/A 07/08/2016   Procedure: ESOPHAGOGASTRODUODENOSCOPY (EGD) WITH PROPOFOL;  Surgeon: Laurence Spates, MD;  Location: [place];  Service: Endoscopy;  Laterality: N/A;   ESOPHAGOGASTRODUODENOSCOPY (EGD) WITH PROPOFOL N/A 03/03/2017   Procedure: ESOPHAGOGASTRODUODENOSCOPY (EGD) WITH PROPOFOL;  Surgeon: Laurence Spates, MD;  Location: [place];  Service: Endoscopy;  Laterality: N/A;   ESOPHAGOGASTRODUODENOSCOPY (EGD) WITH PROPOFOL N/A 09/04/2017   Procedure: ESOPHAGOGASTRODUODENOSCOPY (EGD) WITH PROPOFOL;  Surgeon: [name], MD;  Location: [place];  Service: Gastroenterology;  Laterality: N/A;   ESOPHAGOGASTRODUODENOSCOPY (EGD) WITH PROPOFOL N/A 11/27/2017   Procedure: ESOPHAGOGASTRODUODENOSCOPY (EGD);  Surgeon: [name], MD;  Location: [place];  Service: Gastroenterology;  Laterality: N/A;   ESOPHAGOGASTRODUODENOSCOPY (EGD) WITH  PROPOFOL N/A 08/16/2019   Procedure: ESOPHAGOGASTRODUODENOSCOPY (EGD) WITH PROPOFOL;  Surgeon: [name], MD;  Location: [facility];  Service: Gastroenterology;  Laterality: N/A;   ESOPHAGOGASTRODUODENOSCOPY (EGD)  WITH PROPOFOL N/A 08/21/2020   Procedure: ESOPHAGOGASTRODUODENOSCOPY (EGD) WITH PROPOFOL;  Surgeon: [name], MD;  Location: [facility];  Service: Gastroenterology;  Laterality: N/A;   HEMOSTASIS CLIP PLACEMENT  08/21/2020   Procedure: HEMOSTASIS CLIP PLACEMENT;  Surgeon: [name], MD;  Location: [facility];  Service: Gastroenterology;;   IR GENERIC HISTORICAL  05/15/2016   IR RADIOLOGIST EVAL & MGMT 05/15/2016 Corrie Mckusick, DO [facility] INTERV RAD   IR GENERIC HISTORICAL  01/14/2017   IR US GUIDE VASC ACCESS RIGHT 01/14/2017 Greggory Keen, MD [facility]   IR GENERIC HISTORICAL  01/14/2017   IR VENOGRAM HEPATIC W HEMODYNAMIC EVALUATION 01/14/2017 Greggory Keen, MD [facility]   IR RADIOLOGIST EVAL & MGMT  06/12/2017   IR RADIOLOGIST EVAL & MGMT  08/11/2018   IR RADIOLOGIST EVAL & MGMT  08/25/2019   IR RADIOLOGIST EVAL & MGMT  08/22/2020   MASTECTOMY Right 06/02/12   right breast with lymph node removal   PORT-A-CATH REMOVAL     PORTACATH PLACEMENT  2011   left side   RADIOLOGY WITH ANESTHESIA N/A 04/13/2016   Procedure: RADIOLOGY WITH ANESTHESIA;  Surgeon: Corrie Mckusick, DO;  Location: [place];  Service: Anesthesiology;  Laterality: N/A;    Family History  Problem Relation Age of Onset   COPD Mother    AAA (abdominal aortic aneurysm) Mother    COPD Father    Cancer Neg Hx    Social History:  reports that she has never smoked. She has never used smokeless tobacco. She reports that she does not drink alcohol and does not use drugs.  Allergies: No Known Allergies  Medications Prior to Admission  Medication Sig Dispense Refill   letrozole (FEMARA) 2.5 MG tablet Take 1 tablet (2.5 mg total) by mouth at bedtime. 90 tablet 4   Multiple Vitamin (MULTIVITAMIN WITH MINERALS) TABS tablet Take 1 tablet by mouth daily. Centrum Silver     pantoprazole (PROTONIX) 40 MG tablet Take 1 tablet (40 mg total) by mouth daily. 60 tablet 0   propranolol (INDERAL) 20 MG tablet Take 1 tablet (20 mg total) by  mouth 2 (two) times daily. 60 tablet 0   venlafaxine XR (EFFEXOR-XR) 150 MG 24 hr capsule TAKE 1 CAPSULE BY MOUTH DAILY WITH BREAKFAST. 90 capsule 1   denosumab (PROLIA) 60 MG/ML SOLN injection Inject 60 mg into the skin every 6 (six) months. Administer in upper arm, thigh, or abdomen      No results found for this or any previous visit (from the past 79 hour(s)). No results found.  Review of Systems  Constitutional: Negative.   Respiratory: Negative.    Cardiovascular: Negative.   Gastrointestinal: Negative.    Height '5\' 4"'$  (1.626 m), weight 65.3 kg. Physical Exam Constitutional:      Appearance: Normal appearance.  HENT:     Head: Normocephalic and atraumatic.     Mouth/Throat:     Mouth: Mucous membranes are moist.  Eyes:     Conjunctiva/sclera: Conjunctivae normal.  Cardiovascular:     Rate and Rhythm: Normal rate and regular rhythm.     Heart sounds: Normal heart sounds.  Pulmonary:     Effort: Pulmonary effort is normal.  Abdominal:     General: Bowel sounds are normal.  Skin:    General: Skin is warm.  Neurological:  General: No focal deficit present.     Mental Status: She is alert and oriented to person, place, and time.     Assessment/Plan Portal hypertension, esophageal varices EGD with band ligation. The risks and the benefits of the procedure were discussed with the patient in details, she understands and verbalizes consent.  [name], MD 08/24/2021, 9:23 AM

## 2021-08-24 NOTE — Anesthesia Procedure Notes (Signed)
Procedure Name: MAC Date/Time: 08/24/2021 10:10 AM Performed by: Cynda Familia, CRNA Pre-anesthesia Checklist: Patient identified, Emergency Drugs available, Suction available, Patient being monitored and Timeout performed Oxygen Delivery Method: Simple face mask Placement Confirmation: positive ETCO2 and breath sounds checked- equal and bilateral Dental Injury: Teeth and Oropharynx as per pre-operative assessment  Comments: Bite block by RN

## 2021-08-24 NOTE — Transfer of Care (Signed)
Immediate Anesthesia Transfer of Care Note  Patient: Nicole [name]  Procedure(s) Performed: ESOPHAGOGASTRODUODENOSCOPY (EGD) WITH PROPOFOL GASTRIC VARICES BANDING  Patient Location: Endoscopy Unit  Anesthesia Type:MAC  Level of Consciousness: sedated  Airway & Oxygen Therapy: Patient Spontanous Breathing and Patient connected to face mask oxygen  Post-op Assessment: Report given to RN and Post -op Vital signs reviewed and stable  Post vital signs: Reviewed and stable  Last Vitals:  Vitals Value Taken Time  BP 103/38 08/24/21 1033  Temp    Pulse 67 08/24/21 1034  Resp 16 08/24/21 1034  SpO2 100 % 08/24/21 1034  Vitals shown include unvalidated device data.  Last Pain:  Vitals:   08/24/21 0924  TempSrc: Tympanic  PainSc: 0-No pain         Complications: No notable events documented.

## 2021-08-24 NOTE — Anesthesia Postprocedure Evaluation (Signed)
Anesthesia Post Note  Patient: Nicole [name]  Procedure(s) Performed: ESOPHAGOGASTRODUODENOSCOPY (EGD) WITH PROPOFOL GASTRIC VARICES BANDING     Patient location during evaluation: Endoscopy Anesthesia Type: MAC Level of consciousness: awake Pain management: pain level controlled Vital Signs Assessment: post-procedure vital signs reviewed and stable Respiratory status: spontaneous breathing, nonlabored ventilation, respiratory function stable and patient connected to nasal cannula oxygen Cardiovascular status: stable and blood pressure returned to baseline Postop Assessment: no apparent nausea or vomiting Anesthetic complications: no   No notable events documented.  Last Vitals:  Vitals:   08/24/21 1040 08/24/21 1050  BP: (!) 110/54 138/64  Pulse: 67 62  Resp: (!) 21 19  Temp:    SpO2: 100% 99%    Last Pain:  Vitals:   08/24/21 1050  TempSrc:   PainSc: 0-No pain                 Reisha Wos P Vander Kueker

## 2021-08-24 NOTE — Discharge Instructions (Signed)

## 2021-08-27 ENCOUNTER — Encounter (HOSPITAL_COMMUNITY): Payer: Self-pay | Admitting: Gastroenterology

## 2021-09-06 ENCOUNTER — Other Ambulatory Visit: Payer: Self-pay

## 2021-09-06 ENCOUNTER — Ambulatory Visit
Admission: RE | Admit: 2021-09-06 | Discharge: 2021-09-06 | Disposition: A | Discharge status: Home / Self Care | Payer: Medicare Other | Source: Ambulatory Visit | Attending: Family Medicine | Admitting: Family Medicine

## 2021-09-06 DIAGNOSIS — Z1231 Encounter for screening mammogram for malignant neoplasm of breast: Secondary | ICD-10-CM

## 2021-09-11 ENCOUNTER — Other Ambulatory Visit: Payer: Self-pay | Admitting: Family Medicine

## 2021-09-11 DIAGNOSIS — R928 Other abnormal and inconclusive findings on diagnostic imaging of breast: Secondary | ICD-10-CM

## 2021-09-26 ENCOUNTER — Ambulatory Visit
Admission: RE | Admit: 2021-09-26 | Discharge: 2021-09-26 | Disposition: A | Discharge status: Home / Self Care | Payer: Medicare Other | Source: Ambulatory Visit | Attending: Family Medicine | Admitting: Family Medicine

## 2021-09-26 ENCOUNTER — Other Ambulatory Visit: Payer: Self-pay

## 2021-09-26 DIAGNOSIS — R928 Other abnormal and inconclusive findings on diagnostic imaging of breast: Secondary | ICD-10-CM

## 2021-11-12 ENCOUNTER — Other Ambulatory Visit: Payer: Self-pay | Admitting: Interventional Radiology

## 2021-11-12 DIAGNOSIS — I864 Gastric varices: Secondary | ICD-10-CM

## 2021-11-18 NOTE — Progress Notes (Signed)
Nicole [name]  [phone] [phone]    ID: Nicole [name]   DOB: January 09, 1956  MR#: [id number]  HCW#:[id number]  Patient Care Team: Nicole Nip, MD as PCP - General (Family Medicine) Nicole [name], CRNP as Nurse Practitioner (Nurse Practitioner) Nicole Bookbinder, MD as Consulting Physician (General Surgery) Nicole Spates, MD (Inactive) as Consulting Physician (Gastroenterology) Nicole Juniper, MD as Consulting Physician (Gastroenterology) Nicole [name], Nicole Dad, MD as Consulting Physician (Oncology) OTHER MD:  CHIEF COMPLAINT:  Metastatic Breast Cancer (s/p right mastectomy)  CURRENT TREATMENT: Letrozole, denosumab/Prolia    INTERVAL HISTORY: Nicole [name] returns today for follow-up of her estrogen receptor positive stage IV breast cancer.   As far as the breast cancer is concerned Nicole [name] is taking Letrozole daily and is tolerating this well.  Hot flashes and vaginal dryness are not an issue for her.  She obtains the drug at a good price  Her most recent bone density was completed in 10/2018 and consistent with osteopenia with a T score of -2.2 in the left femur.  She receives Prolia every 6 months for this.  Her most recent dose was in May 2022.  Since her last visit, she had left screening mammography on 09/06/2021 showing possible abnormality. She proceeded to left diagnostic mammogram and left breast ultrasound at [facility] on 09/26/2021 showing: breast density category C; no evidence of malignancy; stable benign dystrophic calcifications.    She also underwent restaging chest CT on 06/04/2021 showing: stable nodularity along right major fissure; left lower lobe nodule is stable to minimally enlarged; small right pleural effusion, minimally enlarged; similar adjacent peribronchovascular ground-glass in right lower lobe; unchanged small collection of subcapsular fluid along dome of liver.  Of note, she also underwent EGD on 08/24/2021 under Dr. Therisa [name]  for therapy of esophageal varices.  In short her cirrhosis and breast cancer problems were more or less stable.  However earlier this month while visiting family in [place] she developed a swollen abdomen, and severe pain.  On 11/03/2021 she was admitted locally for evaluation of abdominal discomfort and CT scan showed:  1.  Colonic diverticulosis without definite evidence for diverticulitis. Wall thickening is seen at the cecum and ascending colon. This may represent an acute infectious, inflammatory, or ischemic colitis or portal colopathy given other findings as discussed below. 2.  Low-density wall thickening of several loops of small bowel in the lower abdomen likely indicating venous congestion in the setting of portal vein thrombosis. Bowel ischemia is not excluded. 3.  Thrombus is present within the main portal vein, splenic vein, and superior mesenteric vein. Occlusive thrombus is seen within multiple tributaries of the SMV. 4.  Findings of hepatic cirrhosis and portal hypertension including distal paraesophageal varices, recanalized periumbilical vein, and large amount of abdominopelvic ascites. 5.  Nonobstructive right nephrolithiasis. 6.  Moderate right pleural effusion.  She was started on anticoagulation with subQ heparin, on which she continues.  She is tolerating this well so far.  She has had no bleeding or bruising from this.  Chest x-ray 11/05/2021 showed prominent opacities in the right lung base.  Abdominal ultrasound 11/08/2021 through [facility] showed mild ascites, insufficient to tap.  Lab work obtained during that admission including negative work-up for lupus anticoagulant, 80% protein C activity, 84% functional protein S level, 79% Antithrombin and negative anticardiolipin antibodies.  It appears that a factor V Leiden and a prothrombin gene mutation were sent but those results results are not available  Paracentesis was attempted but fluid was  loculated. Nicole [name] was  started on Lasix and spironolactone at an appropriate ratio.  The swelling subsided and she started feeling a bit dry in the mouth so she has stopped the Lasix she says.  REVIEW OF SYSTEMS: Currently she denies abdominal pain or distention.  She is cutting back on salt she says.  A detailed review of systems today was surprisingly benign considering the above   COVID 19 VACCINATION STATUS: Status post Pfizer x2+ booster August 2021   BREAST CANCER HISTORY: From Dr. Collier Salina [name]'s 07/25/2010 note:  "[name] has not had any significant medical intervention for some time.  Her last mammogram was about seven years ago.  She noted a right breast mass about a year ago and did not seek immediate medical attention for this.  She ultimately, after some time, admitted this to her husband and self-referred herself for intervention.  She had a mammogram on 07/17/2010 with an ultrasound of the right breast.  This showed a lobulated mass upper right outer quadrant measuring at least 6.3 x 7.3 cm and large right axillary lymph nodes were also noted.  There was skin thickening overlying the mass.  On physical exam, this mass was about an 8 cm with skin dimpling noted. Discolored area in the skin over the mass, fullness in the axilla.  Biopsy was recommended, which took place on 07/17/2010.  Pathology showed invasive ductal cancer involving both lymph node and breast.  The HER-2 was not amplified.  ER and PR both positive at 100% and 6% respectively.  Proliferative index was 12% involving both the lymph node and breast mass.  An MRI scan of both breasts was performed on 07/22/2010, essentially which showed a large heterogeneously enhancing mass of the right breast with washout kinetics measuring 6.4 x 4.4 x 5.0 cm.  Numerous satellite nodules were seen throughout the dominant mass.  There were several suspicious nodules in the upper central and upper inner quadrant of the right breast.  There were two enhancing  subcentimeter nodules seen in the right pectoralis muscle.  No obvious chest wall nodules were seen; however, there was seen some metastatic adenopathy in the mediastinum as well as hila with a 2.4 x 2.0 cm mediastinal lymph node as well as 1.2 x 3.0 cm subcarinal lymph node.  There was a right hilar and probable left hilar adenopathy.  Bilateral pulmonary nodules were seen and a right T2 lesion was seen anterior right liver as well."  Her subsequent history is as detailed below.   PAST MEDICAL HISTORY: Past Medical History:  Diagnosis Date   Anemia    low iron   Arthritis    Blood transfusion 2012   Breast CA (Nicole [name])    breast ca dx 06/2010, stage 4, right , Lung Metasis.Surgery, Chemo, Radiation   Breast cancer (Nicole [name])    Clot    jugular  ?2015   Depression    Esophageal varices ([place])    Headache    Mirgraine- rare since menopause   Heart murmur    mild, no cardiologist, from birth   Hematemesis 08/2017   History of kidney stones    no pain- shows in CT- small 1 mm bilaterally- kidney   Hypertension    IBD (inflammatory bowel disease)    resolved   Idiopathic cirrhosis ([place])    pt denies   Peripheral vascular disease ([place]) few yrs ago   clot in right jugular   Portal hypertension ([place])    Radiation 07/21/12-09/07/12   5940 cGy  Restless legs     PAST SURGICAL HISTORY: Past Surgical History:  Procedure Laterality Date   BIOPSY  08/21/2020   Procedure: BIOPSY;  Surgeon: Nicole Juniper, MD;  Location: [facility];  Service: Gastroenterology;;   COLONOSCOPY  2016   DILATION AND CURETTAGE OF UTERUS  2004?   ESOPHAGEAL BANDING  09/04/2017   Procedure: ESOPHAGEAL BANDING;  Surgeon: Nicole Juniper, MD;  Location: [facility]COPY;  Service: Gastroenterology;;   ESOPHAGEAL BANDING  11/27/2017   Procedure: ESOPHAGEAL BANDING;  Surgeon: Nicole Juniper, MD;  Location: [place];  Service: Gastroenterology;;   ESOPHAGEAL BANDING N/A 04/28/2018   Procedure: ESOPHAGEAL BANDING;  Surgeon: Nicole Juniper, MD;  Location: [facility];  Service: Gastroenterology;  Laterality: N/A;   ESOPHAGEAL BANDING  11/17/2018   Procedure: ESOPHAGEAL BANDING;  Surgeon: Nicole Juniper, MD;  Location: [facility];  Service: Gastroenterology;;   ESOPHAGEAL BANDING  08/16/2019   Procedure: ESOPHAGEAL BANDING;  Surgeon: Nicole Juniper, MD;  Location: [facility];  Service: Gastroenterology;;   ESOPHAGEAL BANDING N/A 08/21/2020   Procedure: ESOPHAGEAL BANDING;  Surgeon: Nicole Juniper, MD;  Location: [facility];  Service: Gastroenterology;  Laterality: N/A;   ESOPHAGOGASTRODUODENOSCOPY N/A 04/28/2018   Procedure: ESOPHAGOGASTRODUODENOSCOPY (EGD);  Surgeon: Nicole Juniper, MD;  Location: [facility];  Service: Gastroenterology;  Laterality: N/A;   ESOPHAGOGASTRODUODENOSCOPY N/A 11/17/2018   Procedure: ESOPHAGOGASTRODUODENOSCOPY (EGD);  Surgeon: Nicole Juniper, MD;  Location: [facility];  Service: Gastroenterology;  Laterality: N/A;   ESOPHAGOGASTRODUODENOSCOPY (EGD) WITH PROPOFOL N/A 04/11/2016   Procedure: ESOPHAGOGASTRODUODENOSCOPY (EGD) WITH PROPOFOL;  Surgeon: Clarene Essex, MD;  Location[facility];  Service: Endoscopy;  Laterality: N/A;   ESOPHAGOGASTRODUODENOSCOPY (EGD) WITH PROPOFOL N/A 07/08/2016   Procedure: ESOPHAGOGASTRODUODENOSCOPY (EGD) WITH PROPOFOL;  Surgeon: Nicole Spates, MD;  Location: [place];  Service: Endoscopy;  Laterality: N/A;   ESOPHAGOGASTRODUODENOSCOPY (EGD) WITH PROPOFOL N/A 03/03/2017   Procedure: ESOPHAGOGASTRODUODENOSCOPY (EGD) WITH PROPOFOL;  Surgeon: Nicole Spates, MD;  Location: [place];  Service: Endoscopy;  Laterality: N/A;   ESOPHAGOGASTRODUODENOSCOPY (EGD) WITH PROPOFOL N/A 09/04/2017   Procedure: ESOPHAGOGASTRODUODENOSCOPY (EGD) WITH PROPOFOL;  Surgeon: Nicole Juniper, MD;  Location: [place];  Service: Gastroenterology;  Laterality: N/A;   ESOPHAGOGASTRODUODENOSCOPY (EGD) WITH PROPOFOL N/A 11/27/2017   Procedure: ESOPHAGOGASTRODUODENOSCOPY (EGD);  Surgeon: Nicole Juniper, MD;   Location: [place];  Service: Gastroenterology;  Laterality: N/A;   ESOPHAGOGASTRODUODENOSCOPY (EGD) WITH PROPOFOL N/A 08/16/2019   Procedure: ESOPHAGOGASTRODUODENOSCOPY (EGD) WITH PROPOFOL;  Surgeon: Nicole Juniper, MD;  [facility];  Service: Gastroenterology;  Laterality: N/A;   ESOPHAGOGASTRODUODENOSCOPY (EGD) WITH PROPOFOL N/A 08/21/2020   Procedure: ESOPHAGOGASTRODUODENOSCOPY (EGD) WITH PROPOFOL;  Surgeon: Nicole Juniper, MD;  [facility];  Service: Gastroenterology;  Laterality: N/A;   ESOPHAGOGASTRODUODENOSCOPY (EGD) WITH PROPOFOL N/A 08/24/2021   Procedure: ESOPHAGOGASTRODUODENOSCOPY (EGD) WITH PROPOFOL;  Surgeon: Nicole Juniper, MD;  [facility];  Service: Gastroenterology;  Laterality: N/A;   GASTRIC VARICES BANDING N/A 08/24/2021   Procedure: GASTRIC VARICES BANDING;  Surgeon: Nicole Juniper, MD;  Location[facility];  Service: Gastroenterology;  Laterality: N/A;   HEMOSTASIS CLIP PLACEMENT  08/21/2020   Procedure: HEMOSTASIS CLIP PLACEMENT;  Surgeon: Nicole Juniper, MD;  Location[facility];  Service: Gastroenterology;;   IR GENERIC HISTORICAL  05/15/2016   IR RADIOLOGIST EVAL & MGMT 05/15/2016 Corrie Mckusick, DO [facility]   IR GENERIC HISTORICAL  01/14/2017   IR US GUIDE VASC ACCESS RIGHT 01/14/2017 Greggory Keen, MD [facility]   IR GENERIC HISTORICAL  01/14/2017   IR VENOGRAM HEPATIC W HEMODYNAMIC EVALUATION 01/14/2017 Greggory Keen, MD [facility]   IR RADIOLOGIST EVAL & MGMT  06/12/2017   IR RADIOLOGIST EVAL & MGMT  08/11/2018   IR RADIOLOGIST EVAL & MGMT  08/25/2019   IR RADIOLOGIST EVAL & MGMT  08/22/2020   MASTECTOMY Right 06/02/12   right breast with lymph node removal   PORT-A-CATH REMOVAL     PORTACATH PLACEMENT  2011   left side   RADIOLOGY WITH ANESTHESIA N/A 04/13/2016   Procedure: RADIOLOGY WITH ANESTHESIA;  Surgeon: Corrie Mckusick, DO;  Location: [place];  Service: Anesthesiology;  Laterality: N/A;    FAMILY HISTORY Family History  Problem  Relation Age of Onset   COPD Mother    AAA (abdominal aortic aneurysm) Mother    COPD Father    Cancer Neg Hx   The patient's father died at the age of 53 from heart disease. Patient's mother died at the age of 67 status post CABG. The patient had no brothers, 2 sisters. There is no history of breast or ovarian cancer in the family.   GYNECOLOGIC HISTORY: Updated May 2018 Menarche age 52, she is GX P0. She does not recall when she went through menopause. She never took hormone replacement.   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 Husband Nicole [name] is retired from [employer]. They have no children and no pets. They attend a local [place].    ADVANCED DIRECTIVES: Not in place   HEALTH MAINTENANCE: Social History   Tobacco Use   Smoking status: Never   Smokeless tobacco: Never  Vaping Use   Vaping Use: Never used  Substance Use Topics   Alcohol use: No   Drug use: No     Colonoscopy: Never  PAP: Remote  Bone density: 09/27/2013, osteoporosis with a T score of -2.8  Lipid panel: Not on file    No Known Allergies  Current Outpatient Medications  Medication Sig Dispense Refill   denosumab (PROLIA) 60 MG/ML SOLN injection Inject 60 mg into the skin every 6 (six) months. Administer in upper arm, thigh, or abdomen     letrozole (FEMARA) 2.5 MG tablet Take 1 tablet (2.5 mg total) by mouth at bedtime. 90 tablet 4   Multiple Vitamin (MULTIVITAMIN WITH MINERALS) TABS tablet Take 1 tablet by mouth daily. Centrum Silver     pantoprazole (PROTONIX) 40 MG tablet Take 1 tablet (40 mg total) by mouth daily. 60 tablet 0   propranolol (INDERAL) 20 MG tablet Take 1 tablet (20 mg total) by mouth 2 (two) times daily. 60 tablet 0   venlafaxine XR (EFFEXOR-XR) 150 MG 24 hr capsule TAKE 1 CAPSULE BY MOUTH DAILY WITH BREAKFAST. 90 capsule 1   No current facility-administered  medications for this visit.   Facility-Administered Medications Ordered in Other Visits  Medication Dose Route Frequency Provider Last Rate Last Admin   denosumab (PROLIA) injection 60 mg  60 mg Subcutaneous Once Maliah Pyles, Nicole Dad, MD        OBJECTIVE: white [name] who appears younger than stated age  Vitals:   11/19/21 1001  BP: 119/85  Pulse: 91  Resp: 18  Temp: 97.6 F (36.4 C)  TempSrc: Temporal  SpO2: 98%  Weight: 131 lb 9.6 oz (59.7 kg)  Height: '5\' 4"'  (1.626 m)    Sclerae unicteric, EOMs intact Wearing a mask No cervical or supraclavicular adenopathy Lungs no rales or rhonchi Heart regular rate and rhythm Abd soft, nontender,  positive bowel sounds, no organomegaly MSK no focal spinal tenderness, no upper extremity lymphedema Neuro: nonfocal, well oriented, positive affect Breasts: The right breast is status post mastectomy followed by radiation.  There is no evidence of local recurrence.  Left breast and both axillae are benign.   LAB RESULTS:  CMP     Component Value Date/Time   NA 143 05/16/2021 1238   NA 142 11/17/2017 1355   K 4.4 05/16/2021 1238   K 4.4 11/17/2017 1355   CL 106 05/16/2021 1238   CL 109 (H) 04/20/2013 0859   CO2 25 05/16/2021 1238   CO2 22 11/17/2017 1355   GLUCOSE 89 05/16/2021 1238   GLUCOSE 86 11/17/2017 1355   GLUCOSE 91 04/20/2013 0859   BUN 20 05/16/2021 1238   BUN 12.5 11/17/2017 1355   CREATININE 1.28 (H) 05/16/2021 1238   CREATININE 1.20 (H) 12/13/2019 1200   CREATININE 1.1 11/17/2017 1355   CALCIUM 9.6 05/16/2021 1238   CALCIUM 9.2 11/17/2017 1355   PROT 7.3 05/16/2021 1238   PROT 7.0 11/17/2017 1355   ALBUMIN 4.1 05/16/2021 1238   ALBUMIN 3.8 11/17/2017 1355   AST 29 05/16/2021 1238   AST 25 12/13/2019 1200   AST 23 11/17/2017 1355   ALT 29 05/16/2021 1238   ALT 24 12/13/2019 1200   ALT 22 11/17/2017 1355   ALKPHOS 72 05/16/2021 1238   ALKPHOS 55 11/17/2017 1355   BILITOT 0.9 05/16/2021 1238   BILITOT 0.8  12/13/2019 1200   BILITOT 0.49 11/17/2017 1355   GFRNONAA 46 (L) 05/16/2021 1238   GFRNONAA 48 (L) 12/13/2019 1200   GFRNONAA 53 (L) 05/09/2016 1104   GFRAA 53 (L) 06/12/2020 1146   GFRAA 55 (L) 12/13/2019 1200   GFRAA 61 05/09/2016 1104    No results found for: TOTALPROTELP, ALBUMINELP, A1GS, A2GS, BETS, BETA2SER, GAMS, MSPIKE, SPEI  Lab Results  Component Value Date   WBC 9.4 11/19/2021   NEUTROABS 7.3 11/19/2021   HGB 14.0 11/19/2021   HCT 42.0 11/19/2021   MCV 88.1 11/19/2021   PLT 192 11/19/2021    Lab Results  Component Value Date   LABCA2 18 08/21/2012    No components found for: AJGOTL572  No results for input(s): INR in the last 168 hours.  Lab Results  Component Value Date   LABCA2 18 08/21/2012    No results found for: CAN199  No results found for: IOM355  No results found for: HRC163  Lab Results  Component Value Date   CA2729 26.9 12/13/2019    No components found for: HGQUANT  No results found for: CEA1 / No results found for: CEA1   No results found for: AFPTUMOR  No results found for: CHROMOGRNA  No results found for: KPAFRELGTCHN, LAMBDASER, KAPLAMBRATIO (kappa/lambda light chains)  No results found for: HGBA, HGBA2QUANT, HGBFQUANT, HGBSQUAN (Hemoglobinopathy evaluation)   Lab Results  Component Value Date   LDH 195 08/21/2012    Lab Results  Component Value Date   IRON 27 (L) 04/11/2016   TIBC 340 04/11/2016   IRONPCTSAT 8 (L) 04/11/2016   (Iron and TIBC)  Lab Results  Component Value Date   FERRITIN 18 04/11/2016    Urinalysis    Component Value Date/Time   LABSPEC 1.010 04/29/2016 1118   PHURINE 7.5 04/29/2016 1118   GLUCOSEU Negative 04/29/2016 1118   HGBUR Trace 04/29/2016 1118   BILIRUBINUR Negative 04/29/2016 1118   KETONESUR Negative 04/29/2016 1118   PROTEINUR < 30 04/29/2016 1118   UROBILINOGEN  0.2 04/29/2016 1118   NITRITE Negative 04/29/2016 1118   LEUKOCYTESUR Trace 04/29/2016 1118     STUDIES: No results found.  COMPARISON: Abdominal ultrasound November 05, 2021   TECHNIQUE:  Real time limited abdomen ultrasound was performed.   IMAGE QUALITY: Satisfactory, of diagnostic value.   FINDINGS:   Limited sonographic views of the abdomen demonstrates mild abdominal ascites. There is no large pocket to safely drain.    IMPRESSION:  IMPRESSION:  1. Mild abdominal ascites.  2. No large enough pocket to safely drain.    ASSESSMENT: 65 y.o. Nicole [name] presenting June 2011 with stage IV breast cancer involving the right breast upper outer quadrant, right axilla, mediastinal lymph nodes, and both lungs, but not the brain, liver, or bones  (1) positive right breast and right axillary lymph node biopsies 07/17/2010 of a clinical T4 N2 M1 invasive ductal carcinoma, grade 1, estrogen receptor 100% positive, progesterone receptor 6% positive, with an MIB-1 of 12%, and no HER-2 amplification.  (2) participated in Phase II tessetaxel study, receiving 2 cycles complicated by thrombocytopenia, anemia requiring transfusion, transaminase elevation, and afebrile neutropenia. Off-study as of September 2011. Chest CT scan September 2011 did show evidence of response.  (3) on letrozole as of October 2011, with continuing response and good tolerance   (4) 06/02/2012 underwent right mastectomy and axillary lymph node sampling (3 lymph nodes removed, all with viable cancer as well as evidence of treatment effect) for a ypT2 ypN1-2 invasive ductal carcinoma, grade 1, 98% estrogen receptor positive, progesterone receptor negative, with no HER-2 amplification  (5) left jugular vein DVT documented 06/16/2012; left sided Port-A-Cath removed mid July 2015; Coumadin stopped 09/07/2014.  (a) Left brachiocephalic v. slightly narrowed, with some Left lower neck collateralization noted on chest CT December 2015  (6) osteoporosis, was on alendronate, switched to denosumab/Prolia 11/07/2016  (a)  bone density in 10/2018 shows osteopenia T score of -2.2  (7) portal hypertension, with splenomegaly, gastric varices, and intestinal edema  (a)  mild persistent leukopenia and thrombocytopenia likely due to splenomegaly  (b) iron deficiency secondary to above, status post Feraheme x2 December 2018  (8) restaging studies:  (a) chest CT 12/26/2017 shows a 4.5 mm left lower lobe pulmonary nodule which is unchanged.  There was no other evidence of active disease  (b) restaging chest CT in 05/2019 shows no change in bilateral pulmonary nodules, prominent mediastinal lymph nodes are stable  (9) CT scan of the abdomen 11/03/2021 shows thrombus in the portal, splenic and superior mesenteric vein  (A) Heparin started 11/03/2021   PLAN:  Nicole [name] is 11-1/2 years out from initial diagnosis of stage IV breast cancer.  Her disease is very well controlled on letrozole and Prolia, which she is tolerating well.  She now has significant mesenteric coagulopathy.  In an otherwise normal person this would suggest a Jak 2 mutation.  In someone with cirrhosis and stage IV cancer there is an inherent risk of hypercoagulability and perhaps something as small as mild diverticulitis may have set this off.  Hypercoagulable work-up was obtained in [place].  All results are not did in but so far they do not show a heritable or acquired hypercoagulable cause.  She is tolerating the heparin well but it is costing her $500 a month.  I wonder if apixaban would be cheaper.  It would certainly be more convenient.  We now have data for apixaban and rivaroxaban in the setting.  I have put in the order for apixaban primarily to find  out what the cost will be I have asked her to call us when she gets that information.  At present she has enough Lovenox to last her another several weeks so we have time to explore this possibility.  She had a chest x-ray in [place] which did not show obvious evidence of progression of her breast  cancer but I think a CT scan at this point would be more informative.  I am also going to obtain a JAK2 level just for baseline.  She already has an appointment with Dr. Therisa [name] her gastroenterologist and she will continue to manage her cirrhosis and its complications.  I did let Nicole [name] know that the Lasix and spironolactone are normally prescribed in a fixed ratio and if she cuts back the Lasix dose she needs to cut that spironolactone dose by a similar percentage.    Otherwise she will return to see [place] in mid February. If at any point we document definite progression of disease we would switch to fulvestrant and palbociclib.  Total encounter time 35 minutes.Nicole [name] C. Aiken Withem, MD 11/19/21 10:14 AM Medical Oncology and Hematology [facility] [place], [place] [address] Tel. [phone]    Fax. [phone]   I, Wilburn Mylar, am acting as scribe for Dr. Virgie [name]. Natsha Guidry.  I, Lurline Del MD, have reviewed the above documentation for accuracy and completeness, and I agree with the above.   *Total Encounter Time as defined by the Centers for Medicare and Medicaid Services includes, in addition to the face-to-face time of a patient visit (documented in the note above) non-face-to-face time: obtaining and reviewing outside history, ordering and reviewing medications, tests or procedures, care coordination (communications with other health care professionals or caregivers) and documentation in the medical record.

## 2021-11-19 ENCOUNTER — Inpatient Hospital Stay: Payer: Medicare Other

## 2021-11-19 ENCOUNTER — Inpatient Hospital Stay: Payer: Medicare Other | Attending: Oncology | Admitting: Oncology

## 2021-11-19 ENCOUNTER — Other Ambulatory Visit: Payer: Self-pay

## 2021-11-19 VITALS — BP 119/85 | HR 91 | Temp 97.6°F | Resp 18 | Ht 64.0 in | Wt 131.6 lb

## 2021-11-19 DIAGNOSIS — Z86718 Personal history of other venous thrombosis and embolism: Secondary | ICD-10-CM | POA: Insufficient documentation

## 2021-11-19 DIAGNOSIS — C50411 Malignant neoplasm of upper-outer quadrant of right female breast: Secondary | ICD-10-CM | POA: Insufficient documentation

## 2021-11-19 DIAGNOSIS — Z9011 Acquired absence of right breast and nipple: Secondary | ICD-10-CM | POA: Insufficient documentation

## 2021-11-19 DIAGNOSIS — C50911 Malignant neoplasm of unspecified site of right female breast: Secondary | ICD-10-CM | POA: Diagnosis not present

## 2021-11-19 DIAGNOSIS — I8290 Acute embolism and thrombosis of unspecified vein: Secondary | ICD-10-CM

## 2021-11-19 DIAGNOSIS — Z170 Estrogen receptor positive status [ER+]: Secondary | ICD-10-CM | POA: Insufficient documentation

## 2021-11-19 DIAGNOSIS — Z79811 Long term (current) use of aromatase inhibitors: Secondary | ICD-10-CM | POA: Diagnosis not present

## 2021-11-19 DIAGNOSIS — Z923 Personal history of irradiation: Secondary | ICD-10-CM | POA: Insufficient documentation

## 2021-11-19 DIAGNOSIS — R188 Other ascites: Secondary | ICD-10-CM | POA: Insufficient documentation

## 2021-11-19 DIAGNOSIS — M810 Age-related osteoporosis without current pathological fracture: Secondary | ICD-10-CM

## 2021-11-19 DIAGNOSIS — I890 Lymphedema, not elsewhere classified: Secondary | ICD-10-CM | POA: Diagnosis not present

## 2021-11-19 DIAGNOSIS — K7460 Unspecified cirrhosis of liver: Secondary | ICD-10-CM

## 2021-11-19 DIAGNOSIS — Z79899 Other long term (current) drug therapy: Secondary | ICD-10-CM | POA: Insufficient documentation

## 2021-11-19 DIAGNOSIS — C7800 Secondary malignant neoplasm of unspecified lung: Secondary | ICD-10-CM

## 2021-11-19 LAB — CBC WITH DIFFERENTIAL (CANCER CENTER ONLY)
Abs Immature Granulocytes: 0.11 10*3/uL — ABNORMAL HIGH (ref 0.00–0.07)
Basophils Absolute: 0.1 10*3/uL (ref 0.0–0.1)
Basophils Relative: 1 %
Eosinophils Absolute: 0.2 10*3/uL (ref 0.0–0.5)
Eosinophils Relative: 2 %
HCT: 42 % (ref 36.0–46.0)
Hemoglobin: 14 g/dL (ref 12.0–15.0)
Immature Granulocytes: 1 %
Lymphocytes Relative: 11 %
Lymphs Abs: 1 10*3/uL (ref 0.7–4.0)
MCH: 29.4 pg (ref 26.0–34.0)
MCHC: 33.3 g/dL (ref 30.0–36.0)
MCV: 88.1 fL (ref 80.0–100.0)
Monocytes Absolute: 0.7 10*3/uL (ref 0.1–1.0)
Monocytes Relative: 8 %
Neutro Abs: 7.3 10*3/uL (ref 1.7–7.7)
Neutrophils Relative %: 77 %
Platelet Count: 192 10*3/uL (ref 150–400)
RBC: 4.77 MIL/uL (ref 3.87–5.11)
RDW: 16.5 % — ABNORMAL HIGH (ref 11.5–15.5)
WBC Count: 9.4 10*3/uL (ref 4.0–10.5)
nRBC: 0 % (ref 0.0–0.2)

## 2021-11-19 LAB — CMP (CANCER CENTER ONLY)
ALT: 25 U/L (ref 0–44)
AST: 22 U/L (ref 15–41)
Albumin: 3.9 g/dL (ref 3.5–5.0)
Alkaline Phosphatase: 74 U/L (ref 38–126)
Anion gap: 11 (ref 5–15)
BUN: 14 mg/dL (ref 8–23)
CO2: 22 mmol/L (ref 22–32)
Calcium: 9.4 mg/dL (ref 8.9–10.3)
Chloride: 105 mmol/L (ref 98–111)
Creatinine: 1.17 mg/dL — ABNORMAL HIGH (ref 0.44–1.00)
GFR, Estimated: 51 mL/min — ABNORMAL LOW (ref >60)
Glucose, Bld: 102 mg/dL — ABNORMAL HIGH (ref 70–99)
Potassium: 4.3 mmol/L (ref 3.5–5.1)
Sodium: 138 mmol/L (ref 135–145)
Total Bilirubin: 0.5 mg/dL (ref 0.3–1.2)
Total Protein: 7.4 g/dL (ref 6.5–8.1)

## 2021-11-19 MED ORDER — APIXABAN (ELIQUIS) VTE STARTER PACK (10MG AND 5MG): ORAL_TABLET | ORAL | 0 refills | Status: DC

## 2021-11-20 ENCOUNTER — Telehealth: Payer: Self-pay | Admitting: Oncology

## 2021-11-20 NOTE — Telephone Encounter (Signed)
Per sch msg, schedule lab and injection. Called and spoke with patients husband. They will call when they get back in town  next week to schedule appt

## 2022-01-07 ENCOUNTER — Other Ambulatory Visit: Payer: Self-pay | Admitting: Oncology

## 2022-02-04 ENCOUNTER — Telehealth: Payer: Self-pay | Admitting: Hematology and Oncology

## 2022-02-04 NOTE — Telephone Encounter (Signed)
Rescheduled appointment per providers template. Left message with new appointment times. 

## 2022-02-11 ENCOUNTER — Ambulatory Visit (HOSPITAL_COMMUNITY): Payer: Medicare Other

## 2022-02-11 ENCOUNTER — Other Ambulatory Visit: Payer: Self-pay

## 2022-02-11 ENCOUNTER — Other Ambulatory Visit: Payer: Medicare Other

## 2022-02-11 ENCOUNTER — Inpatient Hospital Stay: Payer: Medicare Other

## 2022-02-11 ENCOUNTER — Ambulatory Visit: Payer: Medicare Other

## 2022-02-11 ENCOUNTER — Inpatient Hospital Stay: Payer: Medicare Other | Admitting: Hematology and Oncology

## 2022-02-11 MED ORDER — LETROZOLE 2.5 MG PO TABS: 2.5000 mg | ORAL_TABLET | Freq: Every day | ORAL | 3 refills | Status: DC

## 2022-02-13 ENCOUNTER — Other Ambulatory Visit: Payer: Self-pay | Admitting: *Deleted

## 2022-02-13 DIAGNOSIS — C50411 Malignant neoplasm of upper-outer quadrant of right female breast: Secondary | ICD-10-CM

## 2022-02-13 MED ORDER — VENLAFAXINE HCL ER 150 MG PO CP24: 150.0000 mg | ORAL_CAPSULE | Freq: Every day | ORAL | 1 refills | Status: DC

## 2022-02-15 ENCOUNTER — Other Ambulatory Visit: Payer: Medicare Other

## 2022-02-15 ENCOUNTER — Other Ambulatory Visit: Payer: Self-pay | Admitting: *Deleted

## 2022-02-15 ENCOUNTER — Inpatient Hospital Stay: Payer: Medicare Other

## 2022-02-15 ENCOUNTER — Encounter: Payer: Self-pay | Admitting: Hematology and Oncology

## 2022-02-15 ENCOUNTER — Inpatient Hospital Stay: Payer: Medicare Other | Attending: Hematology and Oncology | Admitting: Hematology and Oncology

## 2022-02-15 ENCOUNTER — Other Ambulatory Visit: Payer: Self-pay

## 2022-02-15 VITALS — BP 125/81 | HR 88 | Temp 97.6°F | Resp 16 | Ht 64.0 in | Wt 130.1 lb

## 2022-02-15 DIAGNOSIS — K7460 Unspecified cirrhosis of liver: Secondary | ICD-10-CM

## 2022-02-15 DIAGNOSIS — Z170 Estrogen receptor positive status [ER+]: Secondary | ICD-10-CM | POA: Diagnosis not present

## 2022-02-15 DIAGNOSIS — M810 Age-related osteoporosis without current pathological fracture: Secondary | ICD-10-CM

## 2022-02-15 DIAGNOSIS — C50411 Malignant neoplasm of upper-outer quadrant of right female breast: Secondary | ICD-10-CM

## 2022-02-15 DIAGNOSIS — D6859 Other primary thrombophilia: Secondary | ICD-10-CM | POA: Insufficient documentation

## 2022-02-15 DIAGNOSIS — I8290 Acute embolism and thrombosis of unspecified vein: Secondary | ICD-10-CM | POA: Diagnosis not present

## 2022-02-15 DIAGNOSIS — K55019 Acute (reversible) ischemia of small intestine, extent unspecified: Secondary | ICD-10-CM

## 2022-02-15 DIAGNOSIS — I890 Lymphedema, not elsewhere classified: Secondary | ICD-10-CM

## 2022-02-15 DIAGNOSIS — I81 Portal vein thrombosis: Secondary | ICD-10-CM

## 2022-02-15 DIAGNOSIS — C50911 Malignant neoplasm of unspecified site of right female breast: Secondary | ICD-10-CM

## 2022-02-15 LAB — CBC WITH DIFFERENTIAL (CANCER CENTER ONLY)
Abs Immature Granulocytes: 0.08 10*3/uL — ABNORMAL HIGH (ref 0.00–0.07)
Basophils Absolute: 0 10*3/uL (ref 0.0–0.1)
Basophils Relative: 1 %
Eosinophils Absolute: 0.2 10*3/uL (ref 0.0–0.5)
Eosinophils Relative: 3 %
HCT: 43.6 % (ref 36.0–46.0)
Hemoglobin: 14.2 g/dL (ref 12.0–15.0)
Immature Granulocytes: 1 %
Lymphocytes Relative: 10 %
Lymphs Abs: 0.7 10*3/uL (ref 0.7–4.0)
MCH: 28.5 pg (ref 26.0–34.0)
MCHC: 32.6 g/dL (ref 30.0–36.0)
MCV: 87.6 fL (ref 80.0–100.0)
Monocytes Absolute: 0.5 10*3/uL (ref 0.1–1.0)
Monocytes Relative: 6 %
Neutro Abs: 5.8 10*3/uL (ref 1.7–7.7)
Neutrophils Relative %: 79 %
Platelet Count: 141 10*3/uL — ABNORMAL LOW (ref 150–400)
RBC: 4.98 MIL/uL (ref 3.87–5.11)
RDW: 14.5 % (ref 11.5–15.5)
WBC Count: 7.3 10*3/uL (ref 4.0–10.5)
nRBC: 0 % (ref 0.0–0.2)

## 2022-02-15 LAB — CMP (CANCER CENTER ONLY)
ALT: 13 U/L (ref 0–44)
AST: 18 U/L (ref 15–41)
Albumin: 4.2 g/dL (ref 3.5–5.0)
Alkaline Phosphatase: 77 U/L (ref 38–126)
Anion gap: 8 (ref 5–15)
BUN: 15 mg/dL (ref 8–23)
CO2: 25 mmol/L (ref 22–32)
Calcium: 9.7 mg/dL (ref 8.9–10.3)
Chloride: 106 mmol/L (ref 98–111)
Creatinine: 1.03 mg/dL — ABNORMAL HIGH (ref 0.44–1.00)
GFR, Estimated: 60 mL/min — ABNORMAL LOW (ref >60)
Glucose, Bld: 93 mg/dL (ref 70–99)
Potassium: 4.2 mmol/L (ref 3.5–5.1)
Sodium: 139 mmol/L (ref 135–145)
Total Bilirubin: 0.7 mg/dL (ref 0.3–1.2)
Total Protein: 6.9 g/dL (ref 6.5–8.1)

## 2022-02-15 MED ORDER — DENOSUMAB 60 MG/ML ~~LOC~~ SOSY
60.0000 mg | PREFILLED_SYRINGE | Freq: Once | SUBCUTANEOUS | Status: AC
  Administered 2022-02-15: 60 mg via SUBCUTANEOUS
  Filled 2022-02-15: qty 1

## 2022-02-15 NOTE — Progress Notes (Signed)
[place]  [phone] [phone]) [phone]    ID: Nicole [name]   DOB: 1956/03/18  MR#: [id number]  TMH#:[id number]  Patient Care Team: Aretta Nip, MD as PCP - General (Family Medicine) Roosevelt Locks, CRNP as Nurse Practitioner (Nurse Practitioner) Rolm Bookbinder, MD as Consulting Physician (General Surgery) Laurence Spates, MD (Inactive) as Consulting Physician (Gastroenterology) [name], MD as Consulting Physician (Gastroenterology) Magrinat, Virgie Dad, MD (Inactive) as Consulting Physician (Oncology) OTHER MD:  CHIEF COMPLAINT:  Metastatic Breast Cancer (s/p right mastectomy)  CURRENT TREATMENT: Letrozole, denosumab/Prolia    INTERVAL HISTORY:  Nicole [name] returns today for follow-up of her estrogen receptor positive stage IV breast cancer.  As far as the breast cancer is concerned Nicole [name] is taking Letrozole daily and is tolerating this well.  She has also been taking Eliquis as prescribed.  No new abdominal issues since her last visit here.  During her last visit Dr. Jana Hakim also recommended a JAK2 mutation given mesenteric vein and portal vein thrombosis which she had drawn today.  She had mammogram and ultrasound in September which turned out to be benign.  She also has a CT chest scheduled for February 27, 2022.  No unusual cough, worsening shortness of breath, chest pain, abdominal pain or abdominal distention.  She missed her last dose of Prolia, she forgot to go to the infusion after Dr. Virgie Dad appointment.  She is also due for a bone density scan.  Rest of the pertinent 10 point ROS reviewed and negative.  REVIEW OF SYSTEMS: Currently she denies abdominal pain or distention.  She is cutting back on salt she says.  A detailed review of systems today was surprisingly benign considering the above   COVID 19 VACCINATION STATUS: Status post Pfizer x2+ booster August 2021   BREAST CANCER HISTORY: From Dr. Collier Salina Rubin's 07/25/2010  note:  "This woman has not had any significant medical intervention for some time.  Her last mammogram was about seven years ago.  She noted a right breast mass about a year ago and did not seek immediate medical attention for this.  She ultimately, after some time, admitted this to her husband and self-referred herself for intervention.  She had a mammogram on 07/17/2010 with an ultrasound of the right breast.  This showed a lobulated mass upper right outer quadrant measuring at least 6.3 x 7.3 cm and large right axillary lymph nodes were also noted.  There was skin thickening overlying the mass.  On physical exam, this mass was about an 8 cm with skin dimpling noted. Discolored area in the skin over the mass, fullness in the axilla.  Biopsy was recommended, which took place on 07/17/2010.  Pathology showed invasive ductal cancer involving both lymph node and breast.  The HER-2 was not amplified.  ER and PR both positive at 100% and 6% respectively.  Proliferative index was 12% involving both the lymph node and breast mass.  An MRI scan of both breasts was performed on 07/22/2010, essentially which showed a large heterogeneously enhancing mass of the right breast with washout kinetics measuring 6.4 x 4.4 x 5.0 cm.  Numerous satellite nodules were seen throughout the dominant mass.  There were several suspicious nodules in the upper central and upper inner quadrant of the right breast.  There were two enhancing subcentimeter nodules seen in the right pectoralis muscle.  No obvious chest wall nodules were seen; however, there was seen some metastatic adenopathy in the mediastinum as well as hila with a 2.4  x 2.0 cm mediastinal lymph node as well as 1.2 x 3.0 cm subcarinal lymph node.  There was a right hilar and probable left hilar adenopathy.  Bilateral pulmonary nodules were seen and a right T2 lesion was seen anterior right liver as well."  Her subsequent history is as detailed below.   PAST MEDICAL  HISTORY: Past Medical History:  Diagnosis Date   Anemia    low iron   Arthritis    Blood transfusion 2012   Breast CA ([place])    breast ca dx 06/2010, stage 4, right , Lung Metasis.Surgery, Chemo, Radiation   Breast cancer ([place])    Clot    jugular  ?2015   Depression    Esophageal varices ([place])    Headache    Mirgraine- rare since menopause   Heart murmur    mild, no cardiologist, from birth   Hematemesis 08/2017   History of kidney stones    no pain- shows in CT- small 1 mm bilaterally- kidney   Hypertension    IBD (inflammatory bowel disease)    resolved   Idiopathic cirrhosis ([place])    pt denies   Peripheral vascular disease ([place]) few yrs ago   clot in right jugular   Portal hypertension ([place])    Radiation 07/21/12-09/07/12   5940 cGy   Restless legs     PAST SURGICAL HISTORY: Past Surgical History:  Procedure Laterality Date   BIOPSY  08/21/2020   Procedure: BIOPSY;  Surgeon: [name], MD;  Location: [facility];  Service: Gastroenterology;;   COLONOSCOPY  2016   DILATION AND CURETTAGE OF UTERUS  2004?   ESOPHAGEAL BANDING  09/04/2017   Procedure: ESOPHAGEAL BANDING;  Surgeon: [name], MD;  Location: [facility] END[facility]COPY;  Service: Gastroenterology;;   ESOPHAGEAL BANDING  11/27/2017   Procedure: ESOPHAGEAL BANDING;  Surgeon: [name], MD;  Location: [place];  Service: Gastroenterology;;   ESOPHAGEAL BANDING N/A 04/28/2018   Procedure: ESOPHAGEAL BANDING;  Surgeon: [name], MD;  [facility]: [facility];  Service: Gastroenterology;  Laterality: N/A;   ESOPHAGEAL BANDING  11/17/2018   Procedure: ESOPHAGEAL BANDING;  Surgeon: [name], MD;  Location: [facility];  Service: Gastroenterology;;   ESOPHAGEAL BANDING  08/16/2019   Procedure: ESOPHAGEAL BANDING;  Surgeon: [name], MD;  Location: [facility];  Service: Gastroenterology;;   ESOPHAGEAL BANDING N/A 08/21/2020   Procedure: ESOPHAGEAL BANDING;  Surgeon: [name], MD;  [facility];   Service: Gastroenterology;  Laterality: N/A;   ESOPHAGOGASTRODUODENOSCOPY N/A 04/28/2018   Procedure: ESOPHAGOGASTRODUODENOSCOPY (EGD);  Surgeon: [name], MD;  Location: [facility];  Service: Gastroenterology;  Laterality: N/A;   ESOPHAGOGASTRODUODENOSCOPY N/A 11/17/2018   Procedure: ESOPHAGOGASTRODUODENOSCOPY (EGD);  Surgeon: [name], MD;  Location: [facility];  Service: Gastroenterology;  Laterality: N/A;   ESOPHAGOGASTRODUODENOSCOPY (EGD) WITH PROPOFOL N/A 04/11/2016   Procedure: ESOPHAGOGASTRODUODENOSCOPY (EGD) WITH PROPOFOL;  Surgeon: Clarene Essex, MD;  [facility];  Service: Endoscopy;  Laterality: N/A;   ESOPHAGOGASTRODUODENOSCOPY (EGD) WITH PROPOFOL N/A 07/08/2016   Procedure: ESOPHAGOGASTRODUODENOSCOPY (EGD) WITH PROPOFOL;  Surgeon: Laurence Spates, MD;  Location: [place];  Service: Endoscopy;  Laterality: N/A;   ESOPHAGOGASTRODUODENOSCOPY (EGD) WITH PROPOFOL N/A 03/03/2017   Procedure: ESOPHAGOGASTRODUODENOSCOPY (EGD) WITH PROPOFOL;  Surgeon: Laurence Spates, MD;  Location: [place];  Service: Endoscopy;  Laterality: N/A;   ESOPHAGOGASTRODUODENOSCOPY (EGD) WITH PROPOFOL N/A 09/04/2017   Procedure: ESOPHAGOGASTRODUODENOSCOPY (EGD) WITH PROPOFOL;  Surgeon: [name], MD;  Location: [place];  Service: Gastroenterology;  Laterality: N/A;   ESOPHAGOGASTRODUODENOSCOPY (EGD) WITH PROPOFOL N/A  11/27/2017   Procedure: ESOPHAGOGASTRODUODENOSCOPY (EGD);  Surgeon: [name], MD;  Location: [place];  Service: Gastroenterology;  Laterality: N/A;   ESOPHAGOGASTRODUODENOSCOPY (EGD) WITH PROPOFOL N/A 08/16/2019   Procedure: ESOPHAGOGASTRODUODENOSCOPY (EGD) WITH PROPOFOL;  Surgeon: [name], MD;  [facility];  Service: Gastroenterology;  Laterality: N/A;   ESOPHAGOGASTRODUODENOSCOPY (EGD) WITH PROPOFOL N/A 08/21/2020   Procedure: ESOPHAGOGASTRODUODENOSCOPY (EGD) WITH PROPOFOL;  Surgeon: [name], MD;  [facility];  Service: Gastroenterology;   Laterality: N/A;   ESOPHAGOGASTRODUODENOSCOPY (EGD) WITH PROPOFOL N/A 08/24/2021   Procedure: ESOPHAGOGASTRODUODENOSCOPY (EGD) WITH PROPOFOL;  Surgeon: [name], MD;  [facility];  Service: Gastroenterology;  Laterality: N/A;   GASTRIC VARICES BANDING N/A 08/24/2021   Procedure: GASTRIC VARICES BANDING;  Surgeon: [name], MD;  [facility];  Service: Gastroenterology;  Laterality: N/A;   HEMOSTASIS CLIP PLACEMENT  08/21/2020   Procedure: HEMOSTASIS CLIP PLACEMENT;  Surgeon: [name], MD;  [facility];  Service: Gastroenterology;;   IR GENERIC HISTORICAL  05/15/2016   IR RADIOLOGIST EVAL & MGMT 05/15/2016 Corrie Mckusick, DO [facility]   IR GENERIC HISTORICAL  01/14/2017   IR US GUIDE VASC ACCESS RIGHT 01/14/2017 Greggory Keen, MD [facility]   IR GENERIC HISTORICAL  01/14/2017   IR VENOGRAM HEPATIC W HEMODYNAMIC EVALUATION 01/14/2017 Greggory Keen, MD [facility]   IR RADIOLOGIST EVAL & MGMT  06/12/2017   IR RADIOLOGIST EVAL & MGMT  08/11/2018   IR RADIOLOGIST EVAL & MGMT  08/25/2019   IR RADIOLOGIST EVAL & MGMT  08/22/2020   MASTECTOMY Right 06/02/12   right breast with lymph node removal   PORT-A-CATH REMOVAL     PORTACATH PLACEMENT  2011   left side   RADIOLOGY WITH ANESTHESIA N/A 04/13/2016   Procedure: RADIOLOGY WITH ANESTHESIA;  Surgeon: Corrie Mckusick, DO;  Location: [place];  Service: Anesthesiology;  Laterality: N/A;    FAMILY HISTORY Family History  Problem Relation Age of Onset   COPD Mother    AAA (abdominal aortic aneurysm) Mother    COPD Father    Cancer Neg Hx   The patient's father died at the age of 43 from heart disease. Patient's mother died at the age of 10 status post CABG. The patient had no brothers, 2 sisters. There is no history of breast or ovarian cancer in the family.   GYNECOLOGIC HISTORY: Updated May 2018 Menarche age 66, she is GX P0. She does not recall when she went through menopause. She never took hormone  replacement.   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 Husband Nicole [name] is retired from [employer]. They have no children and no pets. They attend a local [place].    ADVANCED DIRECTIVES: Not in place   HEALTH MAINTENANCE: Social History   Tobacco Use   Smoking status: Never   Smokeless tobacco: Never  Vaping Use   Vaping Use: Never used  Substance Use Topics   Alcohol use: No   Drug use: No     Colonoscopy: Never  PAP: Remote  Bone density: 09/27/2013, osteoporosis with a T score of -2.8  Lipid panel: Not on file    No Known Allergies  Current Outpatient Medications  Medication Sig Dispense Refill   apixaban (ELIQUIS) 5 MG TABS tablet Take 1 tablet (5 mg total) by mouth 2 (two) times daily. 60 tablet 2   denosumab (PROLIA) 60 MG/ML SOLN  injection Inject 60 mg into the skin every 6 (six) months. Administer in upper arm, thigh, or abdomen     letrozole (FEMARA) 2.5 MG tablet Take 1 tablet (2.5 mg total) by mouth daily. 90 tablet 3   Multiple Vitamin (MULTIVITAMIN WITH MINERALS) TABS tablet Take 1 tablet by mouth daily. Centrum Silver     pantoprazole (PROTONIX) 40 MG tablet Take 1 tablet (40 mg total) by mouth daily. 60 tablet 0   propranolol (INDERAL) 20 MG tablet Take 1 tablet (20 mg total) by mouth 2 (two) times daily. 60 tablet 0   venlafaxine XR (EFFEXOR-XR) 150 MG 24 hr capsule Take 1 capsule (150 mg total) by mouth daily with breakfast. 90 capsule 1   No current facility-administered medications for this visit.   Facility-Administered Medications Ordered in Other Visits  Medication Dose Route Frequency Provider Last Rate Last Admin   denosumab (PROLIA) injection 60 mg  60 mg Subcutaneous Once Magrinat, Virgie Dad, MD        OBJECTIVE: white woman who appears younger than stated age  14:   02/15/22 1245  BP: 125/81  Pulse: 88  Resp: 16  Temp:  97.6 F (36.4 C)  TempSrc: Temporal  SpO2: 98%  Weight: 130 lb 1.6 oz (59 kg)  Height: _0  (1.626 m)    Sclerae unicteric, EOMs intact Wearing a mask No cervical or supraclavicular adenopathy Lungs no rales or rhonchi Heart regular rate and rhythm Abd soft, nontender, positive bowel sounds, no organomegaly MSK no focal spinal tenderness, no upper extremity lymphedema Neuro: nonfocal, well oriented, positive affect Breasts: Both breasts examined.  Right breast with broken veins, no evidence of recurrence.  Left breast normal to inspection and palpation.  No regional adenopathy.   LAB RESULTS:  CMP     Component Value Date/Time   NA 138 11/19/2021 0949   NA 142 11/17/2017 1355   K 4.3 11/19/2021 0949   K 4.4 11/17/2017 1355   CL 105 11/19/2021 0949   CL 109 (H) 04/20/2013 0859   CO2 22 11/19/2021 0949   CO2 22 11/17/2017 1355   GLUCOSE 102 (H) 11/19/2021 0949   GLUCOSE 86 11/17/2017 1355   GLUCOSE 91 04/20/2013 0859   BUN 14 11/19/2021 0949   BUN 12.5 11/17/2017 1355   CREATININE 1.17 (H) 11/19/2021 0949   CREATININE 1.1 11/17/2017 1355   CALCIUM 9.4 11/19/2021 0949   CALCIUM 9.2 11/17/2017 1355   PROT 7.4 11/19/2021 0949   PROT 7.0 11/17/2017 1355   ALBUMIN 3.9 11/19/2021 0949   ALBUMIN 3.8 11/17/2017 1355   AST 22 11/19/2021 0949   AST 23 11/17/2017 1355   ALT 25 11/19/2021 0949   ALT 22 11/17/2017 1355   ALKPHOS 74 11/19/2021 0949   ALKPHOS 55 11/17/2017 1355   BILITOT 0.5 11/19/2021 0949   BILITOT 0.49 11/17/2017 1355   GFRNONAA 51 (L) 11/19/2021 0949   GFRNONAA 53 (L) 05/09/2016 1104   GFRAA 53 (L) 06/12/2020 1146   GFRAA 55 (L) 12/13/2019 1200   GFRAA 61 05/09/2016 1104    No results found for: TOTALPROTELP, ALBUMINELP, A1GS, A2GS, BETS, BETA2SER, GAMS, MSPIKE, SPEI  Lab Results  Component Value Date   WBC 7.3 02/15/2022   NEUTROABS 5.8 02/15/2022   HGB 14.2 02/15/2022   HCT 43.6 02/15/2022   MCV 87.6 02/15/2022   PLT 141 (L) 02/15/2022     Lab Results  Component Value Date   LABCA2 18 08/21/2012    No components found for: VXBLTJ030  No results for input(s): INR in the last 168 hours.  Lab Results  Component Value Date   LABCA2 18 08/21/2012    No results found for: CAN199  No results found for: PPI951  No results found for: OAC166  Lab Results  Component Value Date   CA2729 26.9 12/13/2019    No components found for: HGQUANT  No results found for: CEA1 / No results found for: CEA1   No results found for: AFPTUMOR  No results found for: CHROMOGRNA  No results found for: KPAFRELGTCHN, LAMBDASER, KAPLAMBRATIO (kappa/lambda light chains)  No results found for: HGBA, HGBA2QUANT, HGBFQUANT, HGBSQUAN (Hemoglobinopathy evaluation)   Lab Results  Component Value Date   LDH 195 08/21/2012    Lab Results  Component Value Date   IRON 27 (L) 04/11/2016   TIBC 340 04/11/2016   IRONPCTSAT 8 (L) 04/11/2016   (Iron and TIBC)  Lab Results  Component Value Date   FERRITIN 18 04/11/2016    Urinalysis    Component Value Date/Time   LABSPEC 1.010 04/29/2016 1118   PHURINE 7.5 04/29/2016 1118   GLUCOSEU Negative 04/29/2016 1118   HGBUR Trace 04/29/2016 1118   BILIRUBINUR Negative 04/29/2016 1118   KETONESUR Negative 04/29/2016 1118   PROTEINUR < 30 04/29/2016 1118   UROBILINOGEN 0.2 04/29/2016 1118   NITRITE Negative 04/29/2016 1118   LEUKOCYTESUR Trace 04/29/2016 1118    STUDIES: No results found.  COMPARISON: Abdominal ultrasound November 05, 2021   TECHNIQUE:  Real time limited abdomen ultrasound was performed.   IMAGE QUALITY: Satisfactory, of diagnostic value.   FINDINGS:   Limited sonographic views of the abdomen demonstrates mild abdominal ascites. There is no large pocket to safely drain.    IMPRESSION:  IMPRESSION:  1. Mild abdominal ascites.  2. No large enough pocket to safely drain.    ASSESSMENT: 66 y.o. [place] woman presenting June 2011 with stage IV breast  cancer involving the right breast upper outer quadrant, right axilla, mediastinal lymph nodes, and both lungs, but not the brain, liver, or bones  (1) positive right breast and right axillary lymph node biopsies 07/17/2010 of a clinical T4 N2 M1 invasive ductal carcinoma, grade 1, estrogen receptor 100% positive, progesterone receptor 6% positive, with an MIB-1 of 12%, and no HER-2 amplification.  (2) participated in Phase II tessetaxel study, receiving 2 cycles complicated by thrombocytopenia, anemia requiring transfusion, transaminase elevation, and afebrile neutropenia. Off-study as of September 2011. Chest CT scan September 2011 did show evidence of response.  (3) on letrozole as of October 2011, with continuing response and good tolerance   (4) 06/02/2012 underwent right mastectomy and axillary lymph node sampling (3 lymph nodes removed, all with viable cancer as well as evidence of treatment effect) for a ypT2 ypN1-2 invasive ductal carcinoma, grade 1, 98% estrogen receptor positive, progesterone receptor negative, with no HER-2 amplification  (5) left jugular vein DVT documented 06/16/2012; left sided Port-A-Cath removed mid July 2015; Coumadin stopped 09/07/2014.  (a) Left brachiocephalic v. slightly narrowed, with some Left lower neck collateralization noted on chest CT December 2015  (6) osteoporosis, was on alendronate, switched to denosumab/Prolia 11/07/2016  (a) bone density in 10/2018 shows osteopenia T score of -2.2  (7) portal hypertension, with splenomegaly, gastric varices, and intestinal edema  (a)  mild persistent leukopenia and thrombocytopenia likely due to splenomegaly  (b) iron deficiency secondary to above, status post Feraheme x2 December 2018  (8) restaging studies:  (a) chest CT 12/26/2017 shows a 4.5 mm left lower lobe  pulmonary nodule which is unchanged.  There was no other evidence of active disease  (b) restaging chest CT in 05/2019 shows no change in bilateral  pulmonary nodules, prominent mediastinal lymph nodes are stable  (9) CT scan of the abdomen 11/03/2021 shows thrombus in the portal, splenic and superior mesenteric vein  (A) Heparin started 11/03/2021   PLAN:  Latoyna is 11-1/2 years out from initial diagnosis of stage IV breast cancer.  She has been doing very well with the letrozole, no clinical evidence of recurrence.  Last mammogram and ultrasound without any concerns.  Agree with continuing annual mammogram of the left breast.  From bone health standpoint, I have ordered another bone density scan, she will continue on Prolia every 6 months as recommended. With regards to her mesenteric coagulopathy, Dr. Jana Hakim has recommended a JAK2 mutational analysis, have also added beta-2 glycoprotein antibodies.  I agree with the continuing anticoagulation likely indefinitely given the unusual site of blood clotting.  We sometimes do see evidence of portal vein thrombosis in patients with cirrhosis hence it is hard to discern if this is from a hypercoagulable disorder versus primary cirrhosis.  Dr. Jana Hakim has also ordered a CT chest to make sure that she does not have progressive metastatic disease since she had mediastinal adenopathy and lung nodules at the time of diagnosis of her metastatic disease ase was noted to be in mediastinal lymph nodes and lung nodules. She can return to clinic in 1 year unless needed sooner.  Total encounter time 40 minutes.   *Total Encounter Time as defined by the Centers for Medicare and Medicaid Services includes, in addition to the face-to-face time of a patient visit (documented in the note above) non-face-to-face time: obtaining and reviewing outside history, ordering and reviewing medications, tests or procedures, care coordination (communications with other health care professionals or caregivers) and documentation in the medical record.

## 2022-02-17 LAB — BETA-2-GLYCOPROTEIN I ABS, IGG/M/A
Beta-2 Glyco I IgG: <9 GPI IgG units (ref 0–20)
Beta-2-Glycoprotein I IgA: <9 GPI IgA units (ref 0–25)
Beta-2-Glycoprotein I IgM: <9 GPI IgM units (ref 0–32)

## 2022-02-18 ENCOUNTER — Encounter: Payer: Self-pay | Admitting: Hematology and Oncology

## 2022-02-25 LAB — JAK2 (INCLUDING V617F AND EXON 12), MPL,& CALR-NEXT GEN SEQ

## 2022-02-27 ENCOUNTER — Ambulatory Visit (HOSPITAL_COMMUNITY)
Admission: RE | Admit: 2022-02-27 | Discharge: 2022-02-27 | Disposition: A | Discharge status: Home / Self Care | Payer: Medicare Other | Source: Ambulatory Visit | Attending: Hematology and Oncology | Admitting: Hematology and Oncology

## 2022-02-27 ENCOUNTER — Encounter (HOSPITAL_COMMUNITY): Payer: Self-pay

## 2022-02-27 ENCOUNTER — Other Ambulatory Visit: Payer: Self-pay

## 2022-02-27 DIAGNOSIS — C7800 Secondary malignant neoplasm of unspecified lung: Secondary | ICD-10-CM | POA: Diagnosis present

## 2022-02-27 DIAGNOSIS — K7460 Unspecified cirrhosis of liver: Secondary | ICD-10-CM | POA: Diagnosis present

## 2022-02-27 DIAGNOSIS — I890 Lymphedema, not elsewhere classified: Secondary | ICD-10-CM | POA: Insufficient documentation

## 2022-02-27 DIAGNOSIS — Z170 Estrogen receptor positive status [ER+]: Secondary | ICD-10-CM | POA: Diagnosis present

## 2022-02-27 DIAGNOSIS — C50911 Malignant neoplasm of unspecified site of right female breast: Secondary | ICD-10-CM | POA: Insufficient documentation

## 2022-02-27 DIAGNOSIS — C50411 Malignant neoplasm of upper-outer quadrant of right female breast: Secondary | ICD-10-CM | POA: Diagnosis not present

## 2022-02-27 MED ORDER — SODIUM CHLORIDE (PF) 0.9 % IJ SOLN
INTRAMUSCULAR | Status: AC
  Filled 2022-02-27: qty 50

## 2022-02-27 MED ORDER — IOHEXOL 300 MG/ML  SOLN
75.0000 mL | Freq: Once | INTRAMUSCULAR | Status: AC | PRN
  Administered 2022-02-27: 75 mL via INTRAVENOUS

## 2022-03-04 ENCOUNTER — Encounter: Payer: Self-pay | Admitting: Hematology and Oncology

## 2022-03-04 ENCOUNTER — Ambulatory Visit
Admission: RE | Admit: 2022-03-04 | Discharge: 2022-03-04 | Disposition: A | Discharge status: Home / Self Care | Payer: Medicare Other | Source: Ambulatory Visit | Attending: Hematology and Oncology | Admitting: Hematology and Oncology

## 2022-03-04 DIAGNOSIS — M810 Age-related osteoporosis without current pathological fracture: Secondary | ICD-10-CM

## 2022-03-11 ENCOUNTER — Ambulatory Visit
Admission: RE | Admit: 2022-03-11 | Discharge: 2022-03-11 | Disposition: A | Discharge status: Home / Self Care | Payer: Medicare Other | Source: Ambulatory Visit | Attending: Interventional Radiology | Admitting: Interventional Radiology

## 2022-03-11 ENCOUNTER — Other Ambulatory Visit: Payer: Self-pay

## 2022-03-11 ENCOUNTER — Encounter: Payer: Self-pay | Admitting: *Deleted

## 2022-03-11 DIAGNOSIS — I864 Gastric varices: Secondary | ICD-10-CM

## 2022-03-11 HISTORY — PX: IR RADIOLOGIST EVAL & MGMT: IMG5224

## 2022-03-11 NOTE — Progress Notes (Signed)
Chief Complaint: Patient was consulted remotely today (TeleHealth) for Portal HTN, varices, SP BRTO   Referring Physician(s):   GI: Dr. [name] Hepatology: Nicole [name] PCP: Dr. Milagros Evener Hematology: Dr. Benay Pike   History of Present Illness: Nicole [name] is a 66 y.o. female presenting as a scheduled follow up to [place] clinic today, SP BRTO for bleeding gastric varices performed at [facility] 04/13/2016.     We had a telemedicine visit today given the [place] situation, and we confirmed her identity with 2 personal identifiers.  Her husband was with her on the call.   Hx:  We know Nicole [name] for several years now.  We first met her during admission [facility] on 04/11/2016 with lower GI bleeding, with discovery of gastric varices on CT imaging and endoscopy.  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  A BRTO was performed on 04/13/2016 for her GV's via the RCFV, with approach through the spontaneous gastro-spleno-renal shunt.     She has had interval CT's  04/14/2016, 11/06/2016, 06/12/2017, showing no evidence of recurrent/persistent large gastric varices   She has had percutaneous liver biopsy performed 12/02/2016, with the path report confirming "no cirrhotic architecture" with "minimal portal inflammation".  Our last abdominal CT imaging was 08/11/18, with no concerning findings, and no overt cirrhotic features of the liver.  She does have splenomegaly.   She had transjugular pressure measurements performed 01/14/2017, which did confirm an elevated hepatic venous pressure gradient of 80mHg.   Last endo with banding was performed by Dr. KTherisa Doyne8/26/22  Interval Hx: I last saw Nicole MBurford8/24/21, and she was doing fine.  She tells me today [place] was hospitalized for abdominal pain and distension.  Diagnosis of portal vein thrombus was made, as well as  ascites, most likely decompensated portal HTN.   She was started on eliquis, she tells me, and continues that today.  She says paracentesis was contemplated, but not performed at that time due to safety concern.   Since then she has been feeling fine, with no interval problems.   She is unsure of her next GI appt.    She does have upcoming follow up with her oncology team, to follow up the result of a repeat/surveillance chest CT, given her history of breast CA, and monitoring of some lung findings.  The CT was performed 02/27/22.   Past Medical History:  Diagnosis Date   Anemia    low iron   Arthritis    Blood transfusion 2012   Breast CA ([place]    breast ca dx 06/2010, stage 4, right , Lung Metasis.Surgery, Chemo, Radiation   Breast cancer ([place]    Clot    jugular  ?2015   Depression    [place]ophageal [place]ices [place]    Headache    Mirgraine- rare since menopause   Heart murmur    mild, no cardiologist, from birth   Hematemesis 08/2017   History of kidney stones    no pain- shows in CT- small 1 mm bilaterally- kidney   Hypertension    IBD (inflammatory bowel disease)    resolved   Idiopathic cirrhosis ([place]    pt denies   Peripheral vascular disease ([place] few yrs ago   clot in right jugular   Portal hypertension ([place]    Radiation 07/21/12-09/07/12   5940 cGy  Restless legs     Past Surgical History:  Procedure Laterality Date   BIOPSY  08/21/2020   Procedure: BIOPSY;  Surgeon: [name], MD;  [facility];  Service: Gastroenterology;;   COLONOSCOPY  2016   DILATION AND CURETTAGE OF UTERUS  2004?   ESOPHAGEAL BANDING  09/04/2017   Procedure: ESOPHAGEAL BANDING;  Surgeon: [name], MD;  Location: [facility] ENDOSCOPY;  Service: Gastroenterology;;   ESOPHAGEAL BANDING  11/27/2017   Procedure: ESOPHAGEAL BANDING;  Surgeon: [name], MD;  Location: [place];  Service: Gastroenterology;;   ESOPHAGEAL BANDING N/A 04/28/2018   Procedure: ESOPHAGEAL BANDING;  Surgeon:  [name], MD;  [facility];  Service: Gastroenterology;  Laterality: N/A;   ESOPHAGEAL BANDING  11/17/2018   Procedure: ESOPHAGEAL BANDING;  Surgeon: [name], MD;  Location: [facility];  Service: Gastroenterology;;   ESOPHAGEAL BANDING  08/16/2019   Procedure: ESOPHAGEAL BANDING;  Surgeon: [name], MD;  Location: [facility];  Service: Gastroenterology;;   ESOPHAGEAL BANDING N/A 08/21/2020   Procedure: ESOPHAGEAL BANDING;  Surgeon: [name], MD;  [facility];  Service: Gastroenterology;  Laterality: N/A;   ESOPHAGOGASTRODUODENOSCOPY N/A 04/28/2018   Procedure: ESOPHAGOGASTRODUODENOSCOPY (EGD);  Surgeon: [name], MD;  Location: [facility];  Service: Gastroenterology;  Laterality: N/A;   ESOPHAGOGASTRODUODENOSCOPY N/A 11/17/2018   Procedure: ESOPHAGOGASTRODUODENOSCOPY (EGD);  Surgeon: [name], MD;  Location: [facility];  Service: Gastroenterology;  Laterality: N/A;   ESOPHAGOGASTRODUODENOSCOPY (EGD) WITH PROPOFOL N/A 04/11/2016   Procedure: ESOPHAGOGASTRODUODENOSCOPY (EGD) WITH PROPOFOL;  Surgeon: Clarene Essex, MD;  [facility];  Service: Endoscopy;  Laterality: N/A;   ESOPHAGOGASTRODUODENOSCOPY (EGD) WITH PROPOFOL N/A 07/08/2016   Procedure: ESOPHAGOGASTRODUODENOSCOPY (EGD) WITH PROPOFOL;  Surgeon: Laurence Spates, MD;  Location: [place];  Service: Endoscopy;  Laterality: N/A;   ESOPHAGOGASTRODUODENOSCOPY (EGD) WITH PROPOFOL N/A 03/03/2017   Procedure: ESOPHAGOGASTRODUODENOSCOPY (EGD) WITH PROPOFOL;  Surgeon: Laurence Spates, MD;  Location: [place];  Service: Endoscopy;  Laterality: N/A;   ESOPHAGOGASTRODUODENOSCOPY (EGD) WITH PROPOFOL N/A 09/04/2017   Procedure: ESOPHAGOGASTRODUODENOSCOPY (EGD) WITH PROPOFOL;  Surgeon: [name], MD;  Location: [place];  Service: Gastroenterology;  Laterality: N/A;   ESOPHAGOGASTRODUODENOSCOPY (EGD) WITH PROPOFOL N/A 11/27/2017   Procedure: ESOPHAGOGASTRODUODENOSCOPY (EGD);  Surgeon: [name], MD;   Location: [place];  Service: Gastroenterology;  Laterality: N/A;   ESOPHAGOGASTRODUODENOSCOPY (EGD) WITH PROPOFOL N/A 08/16/2019   Procedure: ESOPHAGOGASTRODUODENOSCOPY (EGD) WITH PROPOFOL;  Surgeon: [name], MD;  [facility];  Service: Gastroenterology;  Laterality: N/A;   ESOPHAGOGASTRODUODENOSCOPY (EGD) WITH PROPOFOL N/A 08/21/2020   Procedure: ESOPHAGOGASTRODUODENOSCOPY (EGD) WITH PROPOFOL;  Surgeon: [name], MD;  [facility];  Service: Gastroenterology;  Laterality: N/A;   ESOPHAGOGASTRODUODENOSCOPY (EGD) WITH PROPOFOL N/A 08/24/2021   Procedure: ESOPHAGOGASTRODUODENOSCOPY (EGD) WITH PROPOFOL;  Surgeon: [name], MD;  [facility];  Service: Gastroenterology;  Laterality: N/A;   GASTRIC VARICES BANDING N/A 08/24/2021   Procedure: GASTRIC VARICES BANDING;  Surgeon: [name], MD;  [facility];  Service: Gastroenterology;  Laterality: N/A;   HEMOSTASIS CLIP PLACEMENT  08/21/2020   Procedure: HEMOSTASIS CLIP PLACEMENT;  Surgeon: [name], MD;  [facility];  Service: Gastroenterology;;   IR GENERIC HISTORICAL  05/15/2016   IR RADIOLOGIST EVAL & MGMT 05/15/2016 Corrie Mckusick, DO [facility] INTER[facility]   IR GENERIC HISTORICAL  01/14/2017   IR US GUIDE VASC ACCESS RIGHT 01/14/2017 Greggory Keen, MD [facility]   IR GENERIC HISTORICAL  01/14/2017   IR VENOGRAM HEPATIC W HEMODYNAMIC EVALUATION 01/14/2017 Greggory Keen, MD [facility]  IR RADIOLOGIST EVAL & MGMT  06/12/2017   IR RADIOLOGIST EVAL & MGMT  08/11/2018   IR RADIOLOGIST EVAL & MGMT  08/25/2019   IR RADIOLOGIST EVAL & MGMT  08/22/2020   MASTECTOMY Right 06/02/12   right breast with lymph node removal   PORT-A-CATH REMOVAL     PORTACATH PLACEMENT  2011   left side   RADIOLOGY WITH ANESTHESIA N/A 04/13/2016   Procedure: RADIOLOGY WITH ANESTHESIA;  Surgeon: Corrie Mckusick, DO;  Location: [place];  Service: Anesthesiology;  Laterality: N/A;    Allergies: Patient has no known  allergies.  Medications: Prior to Admission medications   Medication Sig Start Date End Date Taking? Authorizing Provider  apixaban (ELIQUIS) 5 MG TABS tablet Take 1 tablet (5 mg total) by mouth 2 (two) times daily. 01/07/22   Benay Pike, MD  denosumab (PROLIA) 60 MG/ML SOLN injection Inject 60 mg into the skin every 6 (six) months. Administer in upper arm, thigh, or abdomen    [provider]  letrozole (FEMARA) 2.5 MG tablet Take 1 tablet (2.5 mg total) by mouth daily. 02/11/22   Benay Pike, MD  Multiple Vitamin (MULTIVITAMIN WITH MINERALS) TABS tablet Take 1 tablet by mouth daily. Centrum Silver    [provider]  pantoprazole (PROTONIX) 40 MG tablet Take 1 tablet (40 mg total) by mouth daily. 08/21/20 08/24/21  [name], MD  propranolol (INDERAL) 20 MG tablet Take 1 tablet (20 mg total) by mouth 2 (two) times daily. 09/07/17   Modena Jansky, MD  venlafaxine XR (EFFEXOR-XR) 150 MG 24 hr capsule Take 1 capsule (150 mg total) by mouth daily with breakfast. 02/13/22   Benay Pike, MD     Family History  Problem Relation Age of Onset   COPD Mother    AAA (abdominal aortic aneurysm) Mother    COPD Father    Cancer Neg Hx     Social History   Socioeconomic History   Marital status: Married    Spouse name: Not on file   Number of children: Not on file   Years of education: Not on file   Highest education level: Not on file  Occupational History   Not on file  Tobacco Use   Smoking status: Never   Smokeless tobacco: Never  Vaping Use   Vaping Use: Never used  Substance and Sexual Activity   Alcohol use: No   Drug use: No   Sexual activity: Yes  Other Topics Concern   Not on file  Social History Narrative   Not on file   Social Determinants of Health   Financial Resource Strain: Not on file  Food Insecurity: Not on file  Transportation Needs: Not on file  Physical Activity: Not on file  Stress: Not on file  Social Connections: Not on file        Review of Systems  Review of Systems: A 12 point ROS discussed and pertinent positives are indicated in the HPI above.  All other systems are negative.  Physical Exam No direct physical exam was performed (except for noted visual exam findings with Video Visits).    Vital Signs: There were no vitals taken for this visit.  Imaging: CT Chest W Contrast  Result Date: 02/28/2022 CLINICAL DATA:  Restaging right breast cancer. Prior right mastectomy with prior chemotherapy and radiation therapy. Femara in progress. EXAM: CT CHEST WITH CONTRAST TECHNIQUE: Multidetector CT imaging of the chest was performed during intravenous contrast administration. RADIATION DOSE REDUCTION: This exam was performed  according to the departmental dose-optimization program which includes automated exposure control, adjustment of the mA and/or kV according to patient size and/or use of iterative reconstruction technique. CONTRAST:  22m OMNIPAQUE IOHEXOL 300 MG/ML  SOLN COMPARISON:  Multiple exams, including 06/04/2021 FINDINGS: Cardiovascular: Mild atherosclerotic calcification of the aortic arch. Mediastinum/Nodes: Stable small mediastinal lymph nodes. No pathologic adenopathy observed. Mild distal esophageal wall thickening appears unchanged. Lungs/Pleura: Moderate right pleural effusion, increased from previous. Left lower lobe pulmonary nodule 11 by 10 by 10 mm (volume = 580 mm^3) on image 73 series 7 and image 137 series 6, previously the same on 06/04/2021, but increased from remote prior exams such as the 12/02/2017 exam where this nodule measured 6 by 5 by 5 mm (volume = 80 mm^3). There is some localized airway thickening or plugging anteriorly in the left lower lobe on image 81 of series 7 similar to prior. Minimal scarring in both lungs. Prior radiation therapy related findings in the right lung apex and subpleural anterior right lung. Upper Abdomen: Hepatic morphology compatible with cirrhosis. No focal liver  mass is identified. Probable splenomegaly. Perihepatic ascites. Metal coils posterior to the gastric cardia. Nonobstructive 2 mm right mid kidney calculus. Musculoskeletal: Chronic L1 wedge compression fracture. Intersecting bands of thick sclerosis in the L2 vertebral body extending to spur like margins for example along the posterior cortex on image 83 series 6, and accompanied by about 30% loss of vertebral height in the L2 vertebral body, most likely reflecting subacute compression fracture, nonspecific with respect to malignant involvement. Mild left eccentric endplate compression or broad Schmorl's node along the T6 inferior endplate new from 073/42/8768 IMPRESSION: 1. The 580 cubic mm left lower lobe nodule is stable from 06/04/2021 but increased in size from remote prior exams such as 12/02/2017, compatible with malignancy but nonspecific for metastatic disease versus low-grade lung adenocarcinoma. The slight marginal spiculation may in fact favor a low-grade adenocarcinoma of the lung. 2. Intersecting thick bandlike and geographic sclerosis in the L2 vertebral body with evidence of 30% compression, this probably represents a benign subacute compression fracture although malignant involvement is not excluded. There is also subacute mild left eccentric inferior endplate compression at T6 which is new from 06/04/2021. Remote wedge compression fracture at L1. 3. Moderate right pleural effusion, increased from previous. 4. There is some mild localized airway thickening/plugging in the left lower lobe, stable. Mild scarring in both lungs. 5. Hepatic cirrhosis with perihepatic ascites and probable splenomegaly. 6.  Aortic Atherosclerosis (ICD10-I70.0). 7. Mild distal esophageal wall thickening, esophagitis would be a common cause. 8. Nonobstructive right nephrolithiasis. Electronically Signed   By: WVan ClinesM.D.   On: 02/28/2022 11:19   DG Bone Density  Result Date: 03/04/2022 EXAM: DUAL X-RAY  ABSORPTIOMETRY (DXA) FOR BONE MINERAL DENSITY IMPRESSION: Referring Physician:  PBenay PikeYour patient completed a bone mineral density test using GE Lunar iDXA system (analysis version: 16). Technologist: SOberlinPATIENT: Name: MCamari, QuintanillaPatient ID: [id number] Date: [birth year]Height: 63.5 in. Sex: Female Measured: 03/04/2022 Weight: 128.0 lbs. Indications: Breast Cancer History, Caucasian, Estrogen Deficient, Postmenopausal Fractures: Vertebrae Treatments: Prolia ASSESSMENT: The BMD measured at Femur Neck Left is 0.703 g/cm2 with a T-score of -2.4. This patient is considered osteopenic/low bone mass according to [place]([facility] criteria. The quality of the exam is good. L2 was excluded due to degenerative changes. The patient does not meet FRAX criteria. Site Region Measured Date Measured Age YA BMD Significant CHANGE T-score DualFemur Neck Left 1/04/2020 67.2 -  2.4 0.703 g/cm2 AP Spine L1-L4 (L2) 1/04/2020 67.2 -0.5 1.108 g/cm2 DualFemur Total Mean 1/04/2022 67.2 -1.7 0.793 g/cm2 Right Forearm Radius 33% 1/04/2020 67.2 -1.0 0.785 g/cm2 World Health Organization [facility]) criteria for post-menopausal, Caucasian Women: Normal       T-score at or above -1 SD Osteopenia   T-score between -1 and -2.5 SD Osteoporosis T-score at or below -2.5 SD RECOMMENDATION: 1. All patients should optimize calcium and vitamin D intake. 2. Consider FDA-approved medical therapies in postmenopausal women and men aged 73 years and older, based on the following: a. A hip or vertebral (clinical or morphometric) fracture. b. T-score = -2.5 at the femoral neck or spine after appropriate evaluation to exclude secondary causes. c. Low bone mass (T-score between -1.0 and -2.5 at the femoral neck or spine) and a 10-year probability of a hip fracture = 3% or a 10-year probability of a major osteoporosis-related fracture = 20% based on the [place]-adapted WHO algorithm. d. Clinician judgment and/or patient preferences may  indicate treatment for people with 10-year fracture probabilities above or below these levels. FOLLOW-UP: Patients with diagnosis of osteoporosis or at high risk for fracture should have regular bone mineral density tests.? Patients eligible for Medicare are allowed routine testing every 2 years.? The testing frequency can be increased to one year for patients who have rapidly progressing disease, are receiving or discontinuing medical therapy to restore bone mass, or have additional risk factors. I have reviewed this study and agree with the findings. Mark A. Thornton Papas, M.D. [facility] Electronically Signed   By: Lavonia Dana M.D.   On: 03/04/2022 13:03    Labs:  CBC: Recent Labs    05/16/21 1238 11/19/21 0949 02/15/22 1231  WBC 8.2 9.4 7.3  HGB 15.3* 14.0 14.2  HCT 45.3 42.0 43.6  PLT 138* 192 141*    COAGS: No results for input(s): INR, APTT in the last 8760 hours.  BMP: Recent Labs    05/16/21 1238 11/19/21 0949 02/15/22 1231  NA 143 138 139  K 4.4 4.3 4.2  CL 106 105 106  CO2 _0 GLUCOSE 89 102* 93  BUN _1 CALCIUM 9.6 9.4 9.7  CREATININE 1.28* 1.17* 1.03*  GFRNONAA 46* 51* 60*    LIVER FUNCTION TESTS: Recent Labs    05/16/21 1238 11/19/21 0949 02/15/22 1231  BILITOT 0.9 0.5 0.7  AST _2 ALT _3 ALKPHOS 72 74 77  PROT 7.3 7.4 6.9  ALBUMIN 4.1 3.9 4.2    TUMOR MARKERS: No results for input(s): AFPTM, CEA, CA199, CHROMGRNA in the last 8760 hours.  Assessment and Plan:   Nicole Karpf is a wonderful 66 yo female with history of portal htn and EV's/GV's, SP BRTO performed with [facility] now nearly 6 years ago, 04/13/2016.     She continues to have surveillance endoscopy with Dr. Therisa Doyne, last endo with banding 08/24/21, with treatment of EV's with banding, no GV's, but with changes of portal gastropathy.     We discussed the need to continue our surveillance, given the known portal HTN.    I did discuss with her the likelihood that  during her recent [place] admission both the ascites and the right pleural effusion are consequences of portal HTN.  The CT on 3/1 shows persisting pleural effusion.  I did let her know there is no way to know if this is hepatic hydrothorax or related to breast CA, however, I would favor a  consequence of hepatic hydrothorax.  We could sample if needed.   I also did let her know that there is an association with spontaneous portal vein thrombus and portal HTN, and that [facility] is common therapy.   For now, we will plan on continuing our clinic visits, and she can always call us if need be.  We discussed getting onto a staggered pattern of follow up with Dr. Encarnacion Slates follow-up.  We will plan on another 1 year follow up.     Plan: - Office visit with Dr. Earleen Newport in 12 months, no imaging planned.     Electronically Signed: Corrie Mckusick 03/11/2022, 11:07 AM   I spent a total of    15 Minutes in remote  clinical consultation, greater than 50% of which was counseling/coordinating care for portal HTN, prior GV embolization with BRTO.    Visit type: Audio only (telephone). Audio (no video) only due to patient's lack of internet/smartphone capability. Alternative for in-person consultation at [facility], [place] [address] [place], [place], [place]. This visit type was conducted due to national recommendations for restrictions regarding the COVID-19 Pandemic (e.g. social distancing).  This format is felt to be most appropriate for this patient at this time.  All issues noted in this document were discussed and addressed.

## 2022-03-13 ENCOUNTER — Other Ambulatory Visit: Payer: Self-pay | Admitting: *Deleted

## 2022-03-13 NOTE — Progress Notes (Signed)
The proposed treatment discussed in cancer conference 3/9 is for discussion purpose only and is not a binding recommendation.  The patient was not physically examined nor present for their treatment options. Therefore, final treatment plans cannot be decided.  ?

## 2022-03-14 ENCOUNTER — Other Ambulatory Visit: Payer: Self-pay | Admitting: Hematology and Oncology

## 2022-03-14 NOTE — Progress Notes (Signed)
Called patient ?Per TB recommendation, referral sent to thoracic surgery. ? ?Nicole [name]  ? ?

## 2022-04-07 ENCOUNTER — Other Ambulatory Visit: Payer: Self-pay | Admitting: Hematology and Oncology

## 2022-04-08 ENCOUNTER — Encounter: Payer: Self-pay | Admitting: Hematology and Oncology

## 2022-04-09 ENCOUNTER — Encounter: Payer: Medicare Other | Admitting: Thoracic Surgery (Cardiothoracic Vascular Surgery)

## 2022-04-16 ENCOUNTER — Encounter: Payer: Medicare Other | Admitting: Thoracic Surgery (Cardiothoracic Vascular Surgery)

## 2022-04-22 ENCOUNTER — Other Ambulatory Visit: Payer: Self-pay | Admitting: Gastroenterology

## 2022-05-21 ENCOUNTER — Institutional Professional Consult (permissible substitution) (INDEPENDENT_AMBULATORY_CARE_PROVIDER_SITE_OTHER): Payer: Medicare Other | Admitting: Thoracic Surgery (Cardiothoracic Vascular Surgery)

## 2022-05-21 ENCOUNTER — Encounter: Payer: Self-pay | Admitting: Thoracic Surgery (Cardiothoracic Vascular Surgery)

## 2022-05-21 ENCOUNTER — Other Ambulatory Visit: Payer: Self-pay | Admitting: *Deleted

## 2022-05-21 VITALS — BP 120/79 | HR 84 | Resp 20 | Ht 64.0 in | Wt 129.0 lb

## 2022-05-21 DIAGNOSIS — R911 Solitary pulmonary nodule: Secondary | ICD-10-CM | POA: Diagnosis not present

## 2022-05-21 DIAGNOSIS — Z01811 Encounter for preprocedural respiratory examination: Secondary | ICD-10-CM

## 2022-05-21 NOTE — Progress Notes (Signed)
PCP is Rankins, Bill Salinas, MD Referring Provider is Benay Pike, MD  Chief Complaint  Patient presents with   Lung Lesion    Initial surgical consult, Chest CT 3/1,     HPI: Nicole [name] is sent for consultation regarding a left lower lobe lung nodule.  Nicole [name] is a 66 year old woman with history of stage IV breast cancer (2011), idiopathic cirrhosis, portal vein thrombosis, portal hypertension, esophageal varices, hypertension, deep venous thrombosis, arthritis, and heart murmur.  She has had a left lower lobe lung nodule dating back at least 5 years.  In March she had a CT of the chest which showed the lung nodule was stable compared to a CT from 9 months earlier.  There was a moderate right pleural effusion.  I will see any evidence of that being further evaluated.  She feels well.  She is not having any chest pain, pressure, tightness, or shortness of breath.  No headaches or visual changes.  No change in appetite or weight loss.   Past Medical History:  Diagnosis Date   Anemia    low iron   Arthritis    Blood transfusion 2012   Breast CA ([place])    breast ca dx 06/2010, stage 4, right , Lung Metasis.Surgery, Chemo, Radiation   Breast cancer ([place])    Clot    jugular  ?2015   Depression    Esophageal varices ([place])    Headache    Mirgraine- rare since menopause   Heart murmur    mild, no cardiologist, from birth   Hematemesis 08/2017   History of kidney stones    no pain- shows in CT- small 1 mm bilaterally- kidney   Hypertension    IBD (inflammatory bowel disease)    resolved   Idiopathic cirrhosis ([place])    pt denies   Peripheral vascular disease ([place]) few yrs ago   clot in right jugular   Portal hypertension ([place])    Radiation 07/21/12-09/07/12   5940 cGy   Restless legs     Past Surgical History:  Procedure Laterality Date   BIOPSY  08/21/2020   Procedure: BIOPSY;  Surgeon: [name], MD;  Location: [facility];  Service: Gastroenterology;;    COLONOSCOPY  2016   DILATION AND CURETTAGE OF UTERUS  2004?   ESOPHAGEAL BANDING  09/04/2017   Procedure: ESOPHAGEAL BANDING;  Surgeon: [name], MD;  Location: [facility] ENDOSCOPY;  Service: Gastroenterology;;   ESOPHAGEAL BANDING  11/27/2017   Procedure: ESOPHAGEAL BANDING;  Surgeon: [name], MD;  Location: [place];  Service: Gastroenterology;;   ESOPHAGEAL BANDING N/A 04/28/2018   Procedure: ESOPHAGEAL BANDING;  Surgeon: [name], MD;  Location: [facility];  Service: Gastroenterology;  Laterality: N/A;   ESOPHAGEAL BANDING  11/17/2018   Procedure: ESOPHAGEAL BANDING;  Surgeon: [name], MD;  Location: [facility];  Service: Gastroenterology;;   ESOPHAGEAL BANDING  08/16/2019   Procedure: ESOPHAGEAL BANDING;  Surgeon: [name], MD;  Location: [facility];  Service: Gastroenterology;;   ESOPHAGEAL BANDING N/A 08/21/2020   Procedure: ESOPHAGEAL BANDING;  Surgeon: [name], MD;  Location: [facility];  Service: Gastroenterology;  Laterality: N/A;   ESOPHAGOGASTRODUODENOSCOPY N/A 04/28/2018   Procedure: ESOPHAGOGASTRODUODENOSCOPY (EGD);  Surgeon: [name], MD;  Location: [facility];  Service: Gastroenterology;  Laterality: N/A;   ESOPHAGOGASTRODUODENOSCOPY N/A 11/17/2018   Procedure: ESOPHAGOGASTRODUODENOSCOPY (EGD);  Surgeon: [name], MD;  Location: [facility];  Service: Gastroenterology;  Laterality: N/A;   ESOPHAGOGASTRODUODENOSCOPY (EGD) WITH PROPOFOL N/A 04/11/2016   Procedure: ESOPHAGOGASTRODUODENOSCOPY (EGD)  WITH PROPOFOL;  Surgeon: Clarene Essex, MD;  Location: [facility];  Service: Endoscopy;  Laterality: N/A;   ESOPHAGOGASTRODUODENOSCOPY (EGD) WITH PROPOFOL N/A 07/08/2016   Procedure: ESOPHAGOGASTRODUODENOSCOPY (EGD) WITH PROPOFOL;  Surgeon: Laurence Spates, MD;  Location: [place];  Service: Endoscopy;  Laterality: N/A;   ESOPHAGOGASTRODUODENOSCOPY (EGD) WITH PROPOFOL N/A 03/03/2017   Procedure: ESOPHAGOGASTRODUODENOSCOPY (EGD) WITH PROPOFOL;  Surgeon: Laurence Spates, MD;  Location: [place];  Service: Endoscopy;  Laterality: N/A;   ESOPHAGOGASTRODUODENOSCOPY (EGD) WITH PROPOFOL N/A 09/04/2017   Procedure: ESOPHAGOGASTRODUODENOSCOPY (EGD) WITH PROPOFOL;  Surgeon: [name], MD;  Location: [place];  Service: Gastroenterology;  Laterality: N/A;   ESOPHAGOGASTRODUODENOSCOPY (EGD) WITH PROPOFOL N/A 11/27/2017   Procedure: ESOPHAGOGASTRODUODENOSCOPY (EGD);  Surgeon: [name], MD;  Location: [place];  Service: Gastroenterology;  Laterality: N/A;   ESOPHAGOGASTRODUODENOSCOPY (EGD) WITH PROPOFOL N/A 08/16/2019   Procedure: ESOPHAGOGASTRODUODENOSCOPY (EGD) WITH PROPOFOL;  Surgeon: [name], MD;  [facility]: [facility];  Service: Gastroenterology;  Laterality: N/A;   ESOPHAGOGASTRODUODENOSCOPY (EGD) WITH PROPOFOL N/A 08/21/2020   Procedure: ESOPHAGOGASTRODUODENOSCOPY (EGD) WITH PROPOFOL;  Surgeon: [name], MD;  Location: [facility];  Service: Gastroenterology;  Laterality: N/A;   ESOPHAGOGASTRODUODENOSCOPY (EGD) WITH PROPOFOL N/A 08/24/2021   Procedure: ESOPHAGOGASTRODUODENOSCOPY (EGD) WITH PROPOFOL;  Surgeon: [name], MD;  Location: [facility];  Service: Gastroenterology;  Laterality: N/A;   GASTRIC VARICES BANDING N/A 08/24/2021   Procedure: GASTRIC VARICES BANDING;  Surgeon: [name], MD;  Location: [facility];  Service: Gastroenterology;  Laterality: N/A;   HEMOSTASIS CLIP PLACEMENT  08/21/2020   Procedure: HEMOSTASIS CLIP PLACEMENT;  Surgeon: [name], MD;  Location: [facility];  Service: Gastroenterology;;   IR GENERIC HISTORICAL  05/15/2016   IR RADIOLOGIST EVAL & MGMT 05/15/2016 Corrie Mckusick, DO [facility] RAD   IR GENERIC HISTORICAL  01/14/2017   IR US GUIDE VASC ACCESS RIGHT 01/14/2017 Greggory Keen, MD [facility]   IR GENERIC HISTORICAL  01/14/2017   IR VENOGRAM HEPATIC W HEMODYNAMIC EVALUATION 01/14/2017 Greggory Keen, MD [facility]   IR RADIOLOGIST EVAL & MGMT  06/12/2017   IR RADIOLOGIST EVAL & MGMT   08/11/2018   IR RADIOLOGIST EVAL & MGMT  08/25/2019   IR RADIOLOGIST EVAL & MGMT  08/22/2020   IR RADIOLOGIST EVAL & MGMT  03/11/2022   MASTECTOMY Right 06/02/12   right breast with lymph node removal   PORT-A-CATH REMOVAL     PORTACATH PLACEMENT  2011   left side   RADIOLOGY WITH ANESTHESIA N/A 04/13/2016   Procedure: RADIOLOGY WITH ANESTHESIA;  Surgeon: Corrie Mckusick, DO;  Location: [place];  Service: Anesthesiology;  Laterality: N/A;    Family History  Problem Relation Age of Onset   COPD Mother    AAA (abdominal aortic aneurysm) Mother    COPD Father    Cancer Neg Hx     Social History Social History   Tobacco Use   Smoking status: Never   Smokeless tobacco: Never  Vaping Use   Vaping Use: Never used  Substance Use Topics   Alcohol use: No   Drug use: No    Current Outpatient Medications  Medication Sig Dispense Refill   denosumab (PROLIA) 60 MG/ML SOLN injection Inject 60 mg into the skin every 6 (six) months. Administer in upper arm, thigh, or abdomen     ELIQUIS 5 MG TABS tablet TAKE 1 TABLET BY MOUTH TWICE A DAY 60 tablet 2   letrozole (FEMARA) 2.5 MG tablet Take 1 tablet (2.5 mg total) by mouth daily. 90 tablet 3  propranolol (INDERAL) 20 MG tablet Take 1 tablet (20 mg total) by mouth 2 (two) times daily. 60 tablet 0   venlafaxine XR (EFFEXOR-XR) 150 MG 24 hr capsule Take 1 capsule (150 mg total) by mouth daily with breakfast. 90 capsule 1   Multiple Vitamin (MULTIVITAMIN WITH MINERALS) TABS tablet Take 1 tablet by mouth daily. Centrum Silver     pantoprazole (PROTONIX) 40 MG tablet Take 1 tablet (40 mg total) by mouth daily. 60 tablet 0   No current facility-administered medications for this visit.   Facility-Administered Medications Ordered in Other Visits  Medication Dose Route Frequency Provider Last Rate Last Admin   denosumab (PROLIA) injection 60 mg  60 mg Subcutaneous Once Magrinat, Virgie Dad, MD        No Known Allergies  Review of Systems   Constitutional:  Negative for activity change, appetite change and unexpected weight change.  HENT:  Negative for trouble swallowing and voice change.   Respiratory:  Negative for cough, shortness of breath and wheezing.   Cardiovascular:  Negative for chest pain and leg swelling.  Gastrointestinal:  Negative for abdominal distention and abdominal pain.  Musculoskeletal:  Positive for arthralgias.       Leg cramps  Hematological:        Clotting disorder  Psychiatric/Behavioral:  Positive for dysphoric mood.   All other systems reviewed and are negative.  BP 120/79 (BP Location: Left Arm, Patient Position: Sitting)   Pulse 84   Resp 20   Ht '5\' 4"'$  (1.626 m)   Wt 129 lb (58.5 kg)   SpO2 97% Comment: ra  BMI 22.14 kg/m  Physical Exam Vitals reviewed.  Constitutional:      General: She is not in acute distress. HENT:     Head: Normocephalic and atraumatic.  Eyes:     General: No scleral icterus.    Extraocular Movements: Extraocular movements intact.  Cardiovascular:     Rate and Rhythm: Normal rate and regular rhythm.     Heart sounds: Murmur (2/6 systolic) heard.  Pulmonary:     Effort: No respiratory distress.     Breath sounds: No wheezing or rales.     Comments: Diminished breath sounds right base Abdominal:     General: There is no distension.     Palpations: Abdomen is soft.  Skin:    General: Skin is warm and dry.  Neurological:     General: No focal deficit present.     Mental Status: She is alert and oriented to person, place, and time.     Cranial Nerves: No cranial nerve deficit.     Motor: No weakness.     Diagnostic Tests: CT CHEST WITH CONTRAST   TECHNIQUE: Multidetector CT imaging of the chest was performed during intravenous contrast administration.   RADIATION DOSE REDUCTION: This exam was performed according to the departmental dose-optimization program which includes automated exposure control, adjustment of the mA and/or kV according  to patient size and/or use of iterative reconstruction technique.   CONTRAST:  21m OMNIPAQUE IOHEXOL 300 MG/ML  SOLN   COMPARISON:  Multiple exams, including 06/04/2021   FINDINGS: Cardiovascular: Mild atherosclerotic calcification of the aortic arch.   Mediastinum/Nodes: Stable small mediastinal lymph nodes. No pathologic adenopathy observed. Mild distal esophageal wall thickening appears unchanged.   Lungs/Pleura: Moderate right pleural effusion, increased from previous. Left lower lobe pulmonary nodule 11 by 10 by 10 mm (volume = 580 mm^3) on image 73 series 7 and image 137 series 6, previously the  same on 06/04/2021, but increased from remote prior exams such as the 12/02/2017 exam where this nodule measured 6 by 5 by 5 mm (volume = 80 mm^3).   There is some localized airway thickening or plugging anteriorly in the left lower lobe on image 81 of series 7 similar to prior. Minimal scarring in both lungs.   Prior radiation therapy related findings in the right lung apex and subpleural anterior right lung.   Upper Abdomen: Hepatic morphology compatible with cirrhosis. No focal liver mass is identified. Probable splenomegaly. Perihepatic ascites. Metal coils posterior to the gastric cardia. Nonobstructive 2 mm right mid kidney calculus.   Musculoskeletal: Chronic L1 wedge compression fracture. Intersecting bands of thick sclerosis in the L2 vertebral body extending to spur like margins for example along the posterior cortex on image 83 series 6, and accompanied by about 30% loss of vertebral height in the L2 vertebral body, most likely reflecting subacute compression fracture, nonspecific with respect to malignant involvement.   Mild left eccentric endplate compression or broad Schmorl's node along the T6 inferior endplate new from 96/78/9381.   IMPRESSION: 1. The 580 cubic mm left lower lobe nodule is stable from 06/04/2021 but increased in size from remote prior exams  such as 12/02/2017, compatible with malignancy but nonspecific for metastatic disease versus low-grade lung adenocarcinoma. The slight marginal spiculation may in fact favor a low-grade adenocarcinoma of the lung. 2. Intersecting thick bandlike and geographic sclerosis in the L2 vertebral body with evidence of 30% compression, this probably represents a benign subacute compression fracture although malignant involvement is not excluded. There is also subacute mild left eccentric inferior endplate compression at T6 which is new from 06/04/2021. Remote wedge compression fracture at L1. 3. Moderate right pleural effusion, increased from previous. 4. There is some mild localized airway thickening/plugging in the left lower lobe, stable. Mild scarring in both lungs. 5. Hepatic cirrhosis with perihepatic ascites and probable splenomegaly. 6.  Aortic Atherosclerosis (ICD10-I70.0). 7. Mild distal esophageal wall thickening, esophagitis would be a common cause. 8. Nonobstructive right nephrolithiasis.     Electronically Signed   By: Van Clines M.D.   On: 02/28/2022 11:19 I personally reviewed the CT images.  There is a moderate right pleural effusion.  There is a 10 x 11 mm left lower lobe lung nodule.  Minimal change from CT 8-year prior but definite increase in size over the past 4 to 5 years.  Impression: Nicole [name] is a 66 year old woman with history of stage IV breast cancer (2011), idiopathic cirrhosis, portal vein thrombosis, portal hypertension, esophageal varices, hypertension, deep venous thrombosis, arthritis, heart murmur, and a left lower lobe lung nodule.    Left lower lobe lung nodule- Minimal change in the last 9 months, but has grown significantly over the past 5 years.  Differential diagnosis includes low-grade primary bronchogenic carcinoma, metastatic disease, and carcinoid tumor.  Other benign lung tumors are also in the differential diagnosis.  I think metastatic  disease is unlikely.  More likely carcinoid or other benign tumor but cannot rule out a low-grade carcinoma.  She needs a PET/CT to guide our initial diagnostic work-up.  Right pleural effusion-she had a new right pleural effusion on her CT in March.  I do not see any definite evidence that that was ever evaluated.  We will see if it is still present on PET, if it is she will need a thoracentesis.  Lifelong non-smoker.  We will check pulmonary function testing in case we do decide ultimately  that she needs a resection.  Portal vein thrombosis/portal hypertension-said multiple procedures for esophageal varices.  No ascites on exam today.  History of DVT-on Eliquis.  Plan: PET/CT to guide initial diagnostic work-up of lung nodule Pulmonary function testing Return in 3 weeks to consider next steps.  [name], MD [facility] [phone]

## 2022-05-24 ENCOUNTER — Ambulatory Visit (HOSPITAL_COMMUNITY)
Admission: RE | Admit: 2022-05-24 | Discharge: 2022-05-24 | Disposition: A | Discharge status: Home / Self Care | Payer: Medicare Other | Source: Ambulatory Visit | Attending: Thoracic Surgery (Cardiothoracic Vascular Surgery) | Admitting: Thoracic Surgery (Cardiothoracic Vascular Surgery)

## 2022-05-24 DIAGNOSIS — R911 Solitary pulmonary nodule: Secondary | ICD-10-CM | POA: Diagnosis present

## 2022-05-24 LAB — GLUCOSE, CAPILLARY: Glucose-Capillary: 111 mg/dL — ABNORMAL HIGH (ref 70–99)

## 2022-05-24 MED ORDER — FLUDEOXYGLUCOSE F - 18 (FDG) INJECTION
6.4400 | Freq: Once | INTRAVENOUS | Status: AC
  Administered 2022-05-24: 6.44 via INTRAVENOUS

## 2022-05-28 ENCOUNTER — Ambulatory Visit (HOSPITAL_COMMUNITY)
Admission: RE | Admit: 2022-05-28 | Discharge: 2022-05-28 | Disposition: A | Discharge status: Home / Self Care | Payer: Medicare Other | Source: Ambulatory Visit | Attending: Thoracic Surgery (Cardiothoracic Vascular Surgery) | Admitting: Thoracic Surgery (Cardiothoracic Vascular Surgery)

## 2022-05-28 DIAGNOSIS — R911 Solitary pulmonary nodule: Secondary | ICD-10-CM | POA: Insufficient documentation

## 2022-05-28 DIAGNOSIS — R942 Abnormal results of pulmonary function studies: Secondary | ICD-10-CM | POA: Diagnosis not present

## 2022-05-28 DIAGNOSIS — Z01811 Encounter for preprocedural respiratory examination: Secondary | ICD-10-CM | POA: Diagnosis present

## 2022-05-28 LAB — PULMONARY FUNCTION TEST
DL/VA % pred: 65 %
DL/VA: 2.74 ml/min/mmHg/L
DLCO unc % pred: 56 %
DLCO unc: 11.17 ml/min/mmHg
FEF 25-75 Post: 2.53 L/sec
FEF 25-75 Pre: 2.69 L/sec
FEF2575-%Change-Post: -6 %
FEF2575-%Pred-Post: 124 %
FEF2575-%Pred-Pre: 132 %
FEV1-%Change-Post: -2 %
FEV1-%Pred-Post: 102 %
FEV1-%Pred-Pre: 104 %
FEV1-Post: 2.41 L
FEV1-Pre: 2.47 L
FEV1FVC-%Change-Post: -2 %
FEV1FVC-%Pred-Pre: 106 %
FEV6-%Change-Post: 0 %
FEV6-%Pred-Post: 101 %
FEV6-%Pred-Pre: 101 %
FEV6-Post: 3.02 L
FEV6-Pre: 3.02 L
FEV6FVC-%Pred-Post: 104 %
FEV6FVC-%Pred-Pre: 104 %
FVC-%Change-Post: 0 %
FVC-%Pred-Post: 97 %
FVC-%Pred-Pre: 97 %
FVC-Post: 3.02 L
FVC-Pre: 3.02 L
Post FEV1/FVC ratio: 80 %
Post FEV6/FVC ratio: 100 %
Pre FEV1/FVC ratio: 82 %
Pre FEV6/FVC Ratio: 100 %
RV % pred: 123 %
RV: 2.63 L
TLC % pred: 115 %
TLC: 5.83 L

## 2022-05-28 MED ORDER — ALBUTEROL SULFATE (2.5 MG/3ML) 0.083% IN NEBU
2.5000 mg | INHALATION_SOLUTION | Freq: Once | RESPIRATORY_TRACT | Status: AC
  Administered 2022-05-28: 2.5 mg via RESPIRATORY_TRACT

## 2022-06-11 ENCOUNTER — Encounter: Payer: Self-pay | Admitting: *Deleted

## 2022-06-11 ENCOUNTER — Ambulatory Visit (INDEPENDENT_AMBULATORY_CARE_PROVIDER_SITE_OTHER): Payer: Medicare Other | Admitting: Thoracic Surgery (Cardiothoracic Vascular Surgery)

## 2022-06-11 ENCOUNTER — Other Ambulatory Visit: Payer: Self-pay | Admitting: *Deleted

## 2022-06-11 ENCOUNTER — Other Ambulatory Visit: Payer: Self-pay | Admitting: Thoracic Surgery (Cardiothoracic Vascular Surgery)

## 2022-06-11 VITALS — BP 107/66 | HR 86 | Resp 20 | Ht 64.0 in | Wt 133.0 lb

## 2022-06-11 DIAGNOSIS — R911 Solitary pulmonary nodule: Secondary | ICD-10-CM

## 2022-06-11 DIAGNOSIS — Z5181 Encounter for therapeutic drug level monitoring: Secondary | ICD-10-CM
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
## 2022-06-12 ENCOUNTER — Other Ambulatory Visit: Payer: Self-pay | Admitting: Thoracic Surgery (Cardiothoracic Vascular Surgery)

## 2022-06-12 DIAGNOSIS — R911 Solitary pulmonary nodule: Secondary | ICD-10-CM

## 2022-06-14 ENCOUNTER — Ambulatory Visit (HOSPITAL_COMMUNITY)
Admission: RE | Admit: 2022-06-14 | Discharge: 2022-06-14 | Disposition: A | Discharge status: Home / Self Care | Payer: Medicare Other | Source: Ambulatory Visit | Attending: Thoracic Surgery (Cardiothoracic Vascular Surgery) | Admitting: Thoracic Surgery (Cardiothoracic Vascular Surgery)

## 2022-06-14 ENCOUNTER — Other Ambulatory Visit: Payer: Self-pay | Admitting: Thoracic Surgery (Cardiothoracic Vascular Surgery)

## 2022-06-14 DIAGNOSIS — R911 Solitary pulmonary nodule: Secondary | ICD-10-CM

## 2022-06-14 DIAGNOSIS — J90 Pleural effusion, not elsewhere classified: Secondary | ICD-10-CM | POA: Insufficient documentation

## 2022-06-14 MED ORDER — LIDOCAINE HCL 1 % IJ SOLN
INTRAMUSCULAR | Status: AC
  Filled 2022-06-14: qty 20

## 2022-06-18 ENCOUNTER — Telehealth: Payer: Self-pay | Admitting: Hematology and Oncology

## 2022-06-18 NOTE — Telephone Encounter (Signed)
Scheduled appointment per provider. Patient is aware. 

## 2022-06-25 ENCOUNTER — Ambulatory Visit
Admission: RE | Admit: 2022-06-25 | Discharge: 2022-06-25 | Disposition: A | Discharge status: Home / Self Care | Payer: Medicare Other | Source: Ambulatory Visit | Attending: Thoracic Surgery (Cardiothoracic Vascular Surgery) | Admitting: Thoracic Surgery (Cardiothoracic Vascular Surgery)

## 2022-06-25 DIAGNOSIS — R911 Solitary pulmonary nodule: Secondary | ICD-10-CM

## 2022-06-27 NOTE — Progress Notes (Signed)
Surgical Instructions    Your procedure is scheduled on Monday July 3.  Report to [facility] Main Entrance "A" at 5:30 A.M., then check in with the Admitting office.  Call this number if you have problems the morning of surgery:  [phone]   If you have any questions prior to your surgery date call [phone]: Open Monday-Friday 8am-4pm    Remember:  Do not eat or drink anything after midnight the night before your surgery    Take these medicines the morning of surgery with A SIP OF WATER:  letrozole (FEMARA)  pantoprazole (PROTONIX)  propranolol (INDERAL) venlafaxine XR (EFFEXOR-XR)  As of today, STOP taking any Aspirin (unless otherwise instructed by your surgeon) Aleve, Naproxen, Ibuprofen, Motrin, Advil, Goody's, BC's, all herbal medications, fish oil, and all vitamins.  FOLLOW YOUR SURGEON'S INSTRUCTIONS FOR WHEN TO STOP ELIQUIS.          Do not wear jewelry or makeup Do not wear lotions, powders, perfumes/colognes, or deodorant. Do not shave 48 hours prior to surgery.  Men may shave face and neck. Do not bring valuables to the hospital. Do not wear nail polish, gel polish, artificial nails, or any other type of covering on natural nails (fingers and toes) If you have artificial nails or gel coating that need to be removed by a nail salon, please have this removed prior to surgery. Artificial nails or gel coating may interfere with anesthesia's ability to adequately monitor your vital signs.  [facility] is not responsible for any belongings or valuables. .   Do NOT Smoke (Tobacco/Vaping)  24 hours prior to your procedure  If you use a CPAP at night, you may bring your mask for your overnight stay.   Contacts, glasses, hearing aids, dentures or partials may not be worn into surgery, please bring cases for these belongings   For patients admitted to the hospital, discharge time will be determined by your treatment team.   Patients discharged the day of surgery will  not be allowed to drive home, and someone needs to stay with them for 24 hours.   SURGICAL WAITING ROOM VISITATION Patients having surgery or a procedure in a hospital may have two support people. Children under the age of 42 must have an adult with them who is not the patient. They may stay in the waiting area during the procedure and may switch out with other visitors. If the patient needs to stay at the hospital during part of their recovery, the visitor guidelines for inpatient rooms apply.  Please refer to the [facility] website for the visitor guidelines for Inpatients (after your surgery is over and you are in a regular room).       Special instructions:    Oral Hygiene is also important to reduce your risk of infection.  Remember - BRUSH YOUR TEETH THE MORNING OF SURGERY WITH YOUR REGULAR TOOTHPASTE   [place]- Preparing For Surgery  Before surgery, you can play an important role. Because skin is not sterile, your skin needs to be as free of germs as possible. You can reduce the number of germs on your skin by washing with CHG (chlorahexidine gluconate) Soap before surgery.  CHG is an antiseptic cleaner which kills germs and bonds with the skin to continue killing germs even after washing.     Please do not use if you have an allergy to CHG or antibacterial soaps. If your skin becomes reddened/irritated stop using the CHG.  Do not shave (including legs and  underarms) for at least 48 hours prior to first CHG shower. It is OK to shave your face.  Please follow these instructions carefully.     Shower the NIGHT BEFORE SURGERY and the MORNING OF SURGERY with CHG Soap.   If you chose to wash your hair, wash your hair first as usual with your normal shampoo. After you shampoo, rinse your hair and body thoroughly to remove the shampoo.  Then [employer] and genitals (private parts) with your normal soap and rinse thoroughly to remove soap.  After that Use CHG Soap as you would any  other liquid soap. You can apply CHG directly to the skin and wash gently with a scrungie or a clean washcloth.   Apply the CHG Soap to your body ONLY FROM THE NECK DOWN.  Do not use on open wounds or open sores. Avoid contact with your eyes, ears, mouth and genitals (private parts). Wash Face and genitals (private parts)  with your normal soap.   Wash thoroughly, paying special attention to the area where your surgery will be performed.  Thoroughly rinse your body with warm water from the neck down.  DO NOT shower/wash with your normal soap after using and rinsing off the CHG Soap.  Pat yourself dry with a CLEAN TOWEL.  Wear CLEAN PAJAMAS to bed the night before surgery  Place CLEAN SHEETS on your bed the night before your surgery  DO NOT SLEEP WITH PETS.   Day of Surgery:  Take a shower with CHG soap. Wear Clean/Comfortable clothing the morning of surgery Do not apply any deodorants/lotions.   Remember to brush your teeth WITH YOUR REGULAR TOOTHPASTE.    If you received a COVID test during your pre-op visit, it is requested that you wear a mask when out in public, stay away from anyone that may not be feeling well, and notify your surgeon if you develop symptoms. If you have been in contact with anyone that has tested positive in the last 10 days, please notify your surgeon.    Please read over the following fact sheets that you were given.

## 2022-06-28 ENCOUNTER — Other Ambulatory Visit: Payer: Self-pay

## 2022-06-28 ENCOUNTER — Encounter (HOSPITAL_COMMUNITY)
Admission: RE | Admit: 2022-06-28 | Discharge: 2022-06-28 | Disposition: A | Discharge status: Home / Self Care | Payer: Medicare Other | Source: Ambulatory Visit | Attending: Thoracic Surgery (Cardiothoracic Vascular Surgery) | Admitting: Thoracic Surgery (Cardiothoracic Vascular Surgery)

## 2022-06-28 ENCOUNTER — Encounter (HOSPITAL_COMMUNITY): Payer: Self-pay

## 2022-06-28 ENCOUNTER — Ambulatory Visit (HOSPITAL_COMMUNITY)
Admission: RE | Admit: 2022-06-28 | Discharge: 2022-06-28 | Disposition: A | Discharge status: Home / Self Care | Payer: Medicare Other | Source: Ambulatory Visit | Attending: Thoracic Surgery (Cardiothoracic Vascular Surgery) | Admitting: Thoracic Surgery (Cardiothoracic Vascular Surgery)

## 2022-06-28 ENCOUNTER — Encounter (HOSPITAL_COMMUNITY): Payer: Self-pay | Admitting: Thoracic Surgery (Cardiothoracic Vascular Surgery)

## 2022-06-28 VITALS — BP 128/76 | HR 76 | Temp 97.8°F | Resp 17 | Ht 64.0 in | Wt 133.0 lb

## 2022-06-28 DIAGNOSIS — Z853 Personal history of malignant neoplasm of breast: Secondary | ICD-10-CM | POA: Insufficient documentation

## 2022-06-28 DIAGNOSIS — K766 Portal hypertension: Secondary | ICD-10-CM | POA: Insufficient documentation

## 2022-06-28 DIAGNOSIS — Z20822 Contact with and (suspected) exposure to covid-19: Secondary | ICD-10-CM | POA: Insufficient documentation

## 2022-06-28 DIAGNOSIS — Z5181 Encounter for therapeutic drug level monitoring: Secondary | ICD-10-CM

## 2022-06-28 DIAGNOSIS — R911 Solitary pulmonary nodule: Secondary | ICD-10-CM

## 2022-06-28 DIAGNOSIS — Z9011 Acquired absence of right breast and nipple: Secondary | ICD-10-CM | POA: Insufficient documentation

## 2022-06-28 DIAGNOSIS — R188 Other ascites: Secondary | ICD-10-CM | POA: Insufficient documentation

## 2022-06-28 DIAGNOSIS — I10 Essential (primary) hypertension: Secondary | ICD-10-CM | POA: Insufficient documentation

## 2022-06-28 DIAGNOSIS — Z01818 Encounter for other preprocedural examination: Secondary | ICD-10-CM | POA: Insufficient documentation

## 2022-06-28 LAB — CBC
HCT: 40.3 % (ref 36.0–46.0)
Hemoglobin: 12.8 g/dL (ref 12.0–15.0)
MCH: 27.2 pg (ref 26.0–34.0)
MCHC: 31.8 g/dL (ref 30.0–36.0)
MCV: 85.7 fL (ref 80.0–100.0)
Platelets: 137 10*3/uL — ABNORMAL LOW (ref 150–400)
RBC: 4.7 MIL/uL (ref 3.87–5.11)
RDW: 14.6 % (ref 11.5–15.5)
WBC: 8.4 10*3/uL (ref 4.0–10.5)
nRBC: 0 % (ref 0.0–0.2)

## 2022-06-28 LAB — URINALYSIS, ROUTINE W REFLEX MICROSCOPIC
Bilirubin Urine: NEGATIVE
Glucose, UA: NEGATIVE mg/dL
Hgb urine dipstick: NEGATIVE
Ketones, ur: NEGATIVE mg/dL
Leukocytes,Ua: NEGATIVE
Nitrite: NEGATIVE
Protein, ur: NEGATIVE mg/dL
Specific Gravity, Urine: 1.015 (ref 1.005–1.030)
pH: 6 (ref 5.0–8.0)

## 2022-06-28 LAB — PROTIME-INR
INR: 1.3 — ABNORMAL HIGH (ref 0.8–1.2)
Prothrombin Time: 15.7 seconds — ABNORMAL HIGH (ref 11.4–15.2)

## 2022-06-28 LAB — COMPREHENSIVE METABOLIC PANEL
ALT: 20 U/L (ref 0–44)
AST: 24 U/L (ref 15–41)
Albumin: 3.8 g/dL (ref 3.5–5.0)
Alkaline Phosphatase: 49 U/L (ref 38–126)
Anion gap: 8 (ref 5–15)
BUN: 16 mg/dL (ref 8–23)
CO2: 20 mmol/L — ABNORMAL LOW (ref 22–32)
Calcium: 8.9 mg/dL (ref 8.9–10.3)
Chloride: 110 mmol/L (ref 98–111)
Creatinine, Ser: 1.13 mg/dL — ABNORMAL HIGH (ref 0.44–1.00)
GFR, Estimated: 53 mL/min — ABNORMAL LOW (ref >60)
Glucose, Bld: 96 mg/dL (ref 70–99)
Potassium: 4.3 mmol/L (ref 3.5–5.1)
Sodium: 138 mmol/L (ref 135–145)
Total Bilirubin: 0.6 mg/dL (ref 0.3–1.2)
Total Protein: 6.5 g/dL (ref 6.5–8.1)

## 2022-06-28 LAB — BLOOD GAS, ARTERIAL
Acid-base deficit: 0.4 mmol/L (ref 0.0–2.0)
Bicarbonate: 22.9 mmol/L (ref 20.0–28.0)
Drawn by: 58793
O2 Saturation: 98.9 %
Patient temperature: 37
pCO2 arterial: 33 mmHg (ref 32–48)
pH, Arterial: 7.45 (ref 7.35–7.45)
pO2, Arterial: 112 mmHg — ABNORMAL HIGH (ref 83–108)

## 2022-06-28 LAB — SURGICAL PCR SCREEN
MRSA, PCR: NEGATIVE
Staphylococcus aureus: NEGATIVE

## 2022-06-28 LAB — APTT: aPTT: 29 seconds (ref 24–36)

## 2022-06-28 NOTE — Progress Notes (Addendum)
Anesthesia Chart Review:  Case: [id number] Date/Time: 07/01/22 0715   Procedures:      XI ROBOTIC ASSISTED THORACOSCOPY-LEFT LOWER LOBE SEGMENTECTOMY (Left: Chest)     VIDEO BRONCHOSCOPY WITH ENDOBRONCHIAL NAVIGATION   Anesthesia type: General   Pre-op diagnosis: LLL NODULE   Location: [facility] OR ROOM 10 / [facility]   Surgeons: [name], MD       DISCUSSION: Patient is a 66 year old female scheduled for the above procedure.  History includes never smoker, HTN, stage IV right breast cancer (diagnosed 05/2010 with hypermetabolic hilar and lung nodules; participated in Phase II tessetaxel study with response on imaging but off study 08/2010 due to pancytopenia, transaminitis; letrozole started 09/2010; s/p right mastectomy, axillary LN sampling 06/02/12; + left IJ DVT 06/16/12, left Port removed 06/2014), idiopathic liver cirrhosis (with portal hypertension, paraesophageal varices, last banding 08/24/21, s/p coil assisted balloon occluded retrograde transvenous obliteration/BRTO 04/13/16 for gastric varies), mesenteric vein/portal vein thrombosis (with mild ascites on 11/03/21 CT during admission [place] in [place], stated on Lovenox->Eliquis), childhood murmur, anemia.   Per oncology note, "11/19/21 Hypercoagulable work-up was obtained in [place].  All results are not did in but so far they do not show a heritable or acquired hypercoagulable cause."  She has had a LLL lung nodule for at least 5 years, but it recently increased in size. Low metabolic activity on May 0263 PET scan.  Per 06/11/22 progress note by Dr. Roxan Hockey, differential included "a benign tumor such as a carcinoid versus a low-grade adenocarcinoma.Marland KitchenMarland KitchenWe discussed the options of biopsy and possible radiation, surgical resection, and continued radiographic follow-up.  There is a good chance this is a carcinoid tumor and radiation really would not be effective for that.  Therefore would favor resection or observation.  She strongly prefers  resection." He classified her Zubrod Score as 1: Restricted in physical strenuous activity but ambulatory, able to do out light work.   She reported instructions to hold Eliquis after 06/28/22 dose. 06/28/22 CXR and COVID-19 test are still in process. Anesthesia team to evaluate on the day of surgery.   VS: BP 128/76   Pulse 76   Temp 36.6 C (Oral)   Resp 17   Ht '5\' 4"'$  (1.626 m)   Wt 60.3 kg   SpO2 98%   BMI 22.83 kg/m    PROVIDERS: College, [employer]. She was seeing Dr. Milagros Evener, but she recently left the practice.  Benay Pike, MD is HEM-ONC - [name], MD is GI - Beverley Fiedler, [place] is hepatology provider - Corrie Mckusick, DO is IR   LABS: Labs reviewed: Acceptable for surgery.  COVID-19 test is still in process. (all labs ordered are listed, but only abnormal results are displayed)  Labs Reviewed  CBC - Abnormal; Notable for the following components:      Result Value   Platelets 137 (*)    All other components within normal limits  COMPREHENSIVE METABOLIC PANEL - Abnormal; Notable for the following components:   CO2 20 (*)    Creatinine, Ser 1.13 (*)    GFR, Estimated 53 (*)    All other components within normal limits  BLOOD GAS, ARTERIAL - Abnormal; Notable for the following components:   pO2, Arterial 112 (*)    All other components within normal limits  PROTIME-INR - Abnormal; Notable for the following components:   Prothrombin Time 15.7 (*)    INR 1.3 (*)    All other components within normal limits  SURGICAL PCR SCREEN  SARS CORONAVIRUS 2 (TAT 6-24 HRS)  APTT  URINALYSIS, ROUTINE W REFLEX MICROSCOPIC  TYPE AND SCREEN    OTHER: PFTs 05/28/22: FVC 3.02 (97%), post 3.02 (97%). FEV1 2.47 (104%), post 2.41 (102%). DLCO unc 11.17 (56%).  EGD 08/24/21: IMPRESSION: - Normal upper third of esophagus. - Grade II esophageal varices. Completely eradicated. Banded. - Erythematous mucosa in the gastric body. - Portal  hypertensive gastropathy. - Normal examined duodenum. - No specimens collected.   IMAGES: CXR 06/28/22: In process.  CT Super D Chest 06/25/22: IMPRESSION: 1. The dominant left lower lobe nodule is stable from 02/27/2022 at about 580 cubic mm, but has demonstrated substantial growth over the last 5 years favoring a slow growing neoplastic process. Neuroendocrine/carcinoid tumor would certainly be a top differential diagnostic consideration given the slow growth. 2. Healing superior endplate compression at L2 and healing inferior endplate compression at T6. 3. Stable appearance of cirrhosis, uphill paraesophageal varices, small to moderate partially loculated right pleural effusion, and upper abdominal ascites. Probable mild splenomegaly. 4. Nonobstructive right nephrolithiasis.   [place] right chest 06/14/22: IMPRESSION: Small volume pleural effusion, insufficient for thoracentesis.   PET Scan 05/24/22: IMPRESSION: 1. Low metabolic activity of RIGHT lower lobe pulmonary nodule. No change in size from CT 02/27/2022. Differential remains benign or malignant neoplasm. 2. Chronic RIGHT effusion unchanged. 3. No metastatic disease. 4. Post RIGHT mastectomy anatomy. - LEFT lobe pulmonary nodule measures 8 mm by 10 (image 36/CT series 7) and has low metabolic activity SUV max equal 1.3. Size of the nodules unchanged from CT 02/27/2022 (11 x 10 mm).    EKG: 06/18/22: NSR   CV: Echo 08/02/10 (for chemo evaluation): Study Conclusions   Left ventricle: The cavity size was normal. Wall thickness was   normal. Systolic function was normal. The estimated ejection   fraction was in the range of 60% to 65%. Wall motion was normal;   there were no regional wall motion abnormalities. Left ventricular   diastolic function parameters were normal.    Past Medical History:  Diagnosis Date   Anemia    low iron   Arthritis    Blood transfusion 2012   Breast CA ([place])    breast ca dx 06/2010,  stage 4, right , Lung Metasis.Surgery, Chemo, Radiation   Breast cancer ([place])    Clot    jugular  ?2015   Depression    Esophageal varices ([place])    Headache    Mirgraine- rare since menopause   Heart murmur    mild, no cardiologist, from birth   Hematemesis 08/2017   History of kidney stones    no pain- shows in CT- small 1 mm bilaterally- kidney   Hypertension    IBD (inflammatory bowel disease)    resolved   Idiopathic cirrhosis ([place])    pt denies   Peripheral vascular disease ([place]) few yrs ago   clot in right jugular   Portal hypertension ([place])    Radiation 07/21/12-09/07/12   5940 cGy   Restless legs     Past Surgical History:  Procedure Laterality Date   BIOPSY  08/21/2020   Procedure: BIOPSY;  Surgeon: [name], MD;  Location: [facility];  Service: Gastroenterology;;   COLONOSCOPY  2016   DILATION AND CURETTAGE OF UTERUS  2004?   ESOPHAGEAL BANDING  09/04/2017   Procedure: ESOPHAGEAL BANDING;  Surgeon: [name], MD;  Location: [place];  Service: Gastroenterology;;   ESOPHAGEAL BANDING  11/27/2017   Procedure: ESOPHAGEAL BANDING;  Surgeon: [name], MD;  Location: [facility] ENDOSCOPY;  Service: Gastroenterology;;   ESOPHAGEAL BANDING N/A 04/28/2018   Procedure: ESOPHAGEAL BANDING;  Surgeon: [name], MD;  Location: [facility];  Service: Gastroenterology;  Laterality: N/A;   ESOPHAGEAL BANDING  11/17/2018   Procedure: ESOPHAGEAL BANDING;  Surgeon: [name], MD;  Location: [facility];  Service: Gastroenterology;;   ESOPHAGEAL BANDING  08/16/2019   Procedure: ESOPHAGEAL BANDING;  Surgeon: [name], MD;  Location: [facility];  Service: Gastroenterology;;   ESOPHAGEAL BANDING N/A 08/21/2020   Procedure: ESOPHAGEAL BANDING;  Surgeon: [name], MD;  [facility];  Service: Gastroenterology;  Laterality: N/A;   ESOPHAGOGASTRODUODENOSCOPY N/A 04/28/2018   Procedure: ESOPHAGOGASTRODUODENOSCOPY (EGD);  Surgeon: [name], MD;  Location: [facility]  ENDOSCOPY;  Service: Gastroenterology;  Laterality: N/A;   ESOPHAGOGASTRODUODENOSCOPY N/A 11/17/2018   Procedure: ESOPHAGOGASTRODUODENOSCOPY (EGD);  Surgeon: [name], MD;  Location: [facility];  Service: Gastroenterology;  Laterality: N/A;   ESOPHAGOGASTRODUODENOSCOPY (EGD) WITH PROPOFOL N/A 04/11/2016   Procedure: ESOPHAGOGASTRODUODENOSCOPY (EGD) WITH PROPOFOL;  Surgeon: Clarene Essex, MD;  [facility];  Service: Endoscopy;  Laterality: N/A;   ESOPHAGOGASTRODUODENOSCOPY (EGD) WITH PROPOFOL N/A 07/08/2016   Procedure: ESOPHAGOGASTRODUODENOSCOPY (EGD) WITH PROPOFOL;  Surgeon: Laurence Spates, MD;  Location: [place];  Service: Endoscopy;  Laterality: N/A;   ESOPHAGOGASTRODUODENOSCOPY (EGD) WITH PROPOFOL N/A 03/03/2017   Procedure: ESOPHAGOGASTRODUODENOSCOPY (EGD) WITH PROPOFOL;  Surgeon: Laurence Spates, MD;  Location: [facility];  Service: Endoscopy;  Laterality: N/A;   ESOPHAGOGASTRODUODENOSCOPY (EGD) WITH PROPOFOL N/A 09/04/2017   Procedure: ESOPHAGOGASTRODUODENOSCOPY (EGD) WITH PROPOFOL;  Surgeon: [name], MD;  Location: [place];  Service: Gastroenterology;  Laterality: N/A;   ESOPHAGOGASTRODUODENOSCOPY (EGD) WITH PROPOFOL N/A 11/27/2017   Procedure: ESOPHAGOGASTRODUODENOSCOPY (EGD);  Surgeon: [name], MD;  Location: [place];  Service: Gastroenterology;  Laterality: N/A;   ESOPHAGOGASTRODUODENOSCOPY (EGD) WITH PROPOFOL N/A 08/16/2019   Procedure: ESOPHAGOGASTRODUODENOSCOPY (EGD) WITH PROPOFOL;  Surgeon: [name], MD;  [facility];  Service: Gastroenterology;  Laterality: N/A;   ESOPHAGOGASTRODUODENOSCOPY (EGD) WITH PROPOFOL N/A 08/21/2020   Procedure: ESOPHAGOGASTRODUODENOSCOPY (EGD) WITH PROPOFOL;  Surgeon: [name], MD;  [facility];  Service: Gastroenterology;  Laterality: N/A;   ESOPHAGOGASTRODUODENOSCOPY (EGD) WITH PROPOFOL N/A 08/24/2021   Procedure: ESOPHAGOGASTRODUODENOSCOPY (EGD) WITH PROPOFOL;  Surgeon: [name], MD;  [facility];  Service: Gastroenterology;  Laterality: N/A;   GASTRIC VARICES BANDING N/A 08/24/2021   Procedure: GASTRIC VARICES BANDING;  Surgeon: [name], MD;  [facility];  Service: Gastroenterology;  Laterality: N/A;   HEMOSTASIS CLIP PLACEMENT  08/21/2020   Procedure: HEMOSTASIS CLIP PLACEMENT;  Surgeon: [name], MD;  [facility];  Service: Gastroenterology;;   IR GENERIC HISTORICAL  05/15/2016   IR RADIOLOGIST EVAL & MGMT 05/15/2016 Corrie Mckusick, DO [facility] INTER[facility] RAD   IR GENERIC HISTORICAL  01/14/2017   IR US GUIDE VASC ACCESS RIGHT 01/14/2017 Greggory Keen, MD [facility]   IR GENERIC HISTORICAL  01/14/2017   IR VENOGRAM HEPATIC W HEMODYNAMIC EVALUATION 01/14/2017 Greggory Keen, MD [facility]   IR RADIOLOGIST EVAL & MGMT  06/12/2017   IR RADIOLOGIST EVAL & MGMT  08/11/2018   IR RADIOLOGIST EVAL & MGMT  08/25/2019   IR RADIOLOGIST EVAL & MGMT  08/22/2020   IR RADIOLOGIST EVAL & MGMT  03/11/2022   MASTECTOMY Right 06/02/12   right breast with lymph node removal   PORT-A-CATH REMOVAL     PORTACATH PLACEMENT  2011   left side   RADIOLOGY WITH ANESTHESIA N/A 04/13/2016   Procedure: RADIOLOGY  WITH ANESTHESIA;  Surgeon: Corrie Mckusick, DO;  Location: [place];  Service: Anesthesiology;  Laterality: N/A;    MEDICATIONS:  denosumab (PROLIA) 60 MG/ML SOLN injection   ELIQUIS 5 MG TABS tablet   folic acid (FOLVITE) 1 MG tablet   letrozole (FEMARA) 2.5 MG tablet   Milk Thistle 1000 MG CAPS   pantoprazole (PROTONIX) 40 MG tablet   propranolol (INDERAL) 20 MG tablet   venlafaxine XR (EFFEXOR-XR) 150 MG 24 hr capsule   No current facility-administered medications for this encounter.    denosumab (PROLIA) injection 60 mg    Myra Gianotti, PA-C Surgical Short Stay/Anesthesiology [facility] Phone [phone] [facility] Phone [phone] 06/28/2022 12:43 PM

## 2022-06-28 NOTE — Anesthesia Preprocedure Evaluation (Signed)
Anesthesia Evaluation  Patient identified by MRN, date of birth, ID band Patient awake    Reviewed: Allergy & Precautions, NPO status , Patient's Chart, lab work & pertinent test results  Airway Mallampati: II  TM Distance: >3 FB Neck ROM: Full    Dental  (+) Dental Advisory Given   Pulmonary neg pulmonary ROS,    breath sounds clear to auscultation       Cardiovascular hypertension, Pt. on medications and Pt. on home beta blockers + Peripheral Vascular Disease   Rhythm:Regular Rate:Normal     Neuro/Psych negative neurological ROS     GI/Hepatic negative GI ROS, (+) Cirrhosis       ,   Endo/Other  negative endocrine ROS  Renal/GU negative Renal ROS     Musculoskeletal  (+) Arthritis ,   Abdominal   Peds  Hematology negative hematology ROS (+)   Anesthesia Other Findings   Reproductive/Obstetrics                            Lab Results  Component Value Date   WBC 8.4 06/28/2022   HGB 12.8 06/28/2022   HCT 40.3 06/28/2022   MCV 85.7 06/28/2022   PLT 137 (L) 06/28/2022   Lab Results  Component Value Date   CREATININE 1.13 (H) 06/28/2022   BUN 16 06/28/2022   NA 138 06/28/2022   K 4.3 06/28/2022   CL 110 06/28/2022   CO2 20 (L) 06/28/2022    Anesthesia Physical Anesthesia Plan  ASA: 3  Anesthesia Plan: General   Post-op Pain Management: Minimal or no pain anticipated and Ketamine IV*   Induction: Intravenous  PONV Risk Score and Plan: 3 and Dexamethasone, Ondansetron and Treatment may vary due to age or medical condition  Airway Management Planned: Double Lumen EBT  Additional Equipment: Arterial line  Intra-op Plan:   Post-operative Plan: Extubation in OR  Informed Consent: I have reviewed the patients History and Physical, chart, labs and discussed the procedure including the risks, benefits and alternatives for the proposed anesthesia with the patient or  authorized representative who has indicated his/her understanding and acceptance.     Dental advisory given  Plan Discussed with: CRNA  Anesthesia Plan Comments: ( )       Anesthesia Quick Evaluation

## 2022-06-28 NOTE — Progress Notes (Signed)
PCP - [facility] at [facility] to see Dr. Milagros Evener but has not seen anyone else since Dr.Rankins left. Cardiologist - none  PPM/ICD - denies Device Orders -  Rep Notified -   Chest x-ray - 06/28/22 EKG - 06/28/22 Stress Test - none ECHO - 08/02/2010 Cardiac Cath - none  Sleep Study - none CPAP -   Fasting Blood Sugar - na Checks Blood Sugar _____ times a day  Blood Thinner Instructions:Pt states she was instructed to stop Eliquis two days prior to surgery. She says her last dose will be 6/30. Aspirin Instructions:na  ERAS Protcol -no PRE-SURGERY Ensure or G2-   COVID TEST- 06/28/22   Anesthesia review: yes- history of portal vein thrombosis, esophageal varices, having major chest surgery.  Patient denies shortness of breath, fever, cough and chest pain at PAT appointment   All instructions explained to the patient, with a verbal understanding of the material. Patient agrees to go over the instructions while at home for a better understanding. Patient also instructed to self quarantine after being tested for COVID-19. The opportunity to ask questions was provided.

## 2022-06-29 LAB — SARS CORONAVIRUS 2 (TAT 6-24 HRS): SARS Coronavirus 2: NEGATIVE

## 2022-07-01 ENCOUNTER — Inpatient Hospital Stay (HOSPITAL_COMMUNITY): Payer: Medicare Other | Admitting: Vascular Surgery

## 2022-07-01 ENCOUNTER — Other Ambulatory Visit: Payer: Self-pay

## 2022-07-01 ENCOUNTER — Inpatient Hospital Stay (HOSPITAL_COMMUNITY)
Admission: RE | Admit: 2022-07-01 | Discharge: 2022-07-05 | DRG: 164 | Disposition: A | Discharge status: Home / Self Care | Payer: Medicare Other | Attending: Thoracic Surgery (Cardiothoracic Vascular Surgery) | Admitting: Thoracic Surgery (Cardiothoracic Vascular Surgery)

## 2022-07-01 ENCOUNTER — Inpatient Hospital Stay (HOSPITAL_COMMUNITY): Payer: Medicare Other

## 2022-07-01 ENCOUNTER — Inpatient Hospital Stay (HOSPITAL_COMMUNITY): Payer: Medicare Other | Admitting: Anesthesiology

## 2022-07-01 ENCOUNTER — Encounter (HOSPITAL_COMMUNITY)
Admission: RE | Disposition: A | Discharge status: Home / Self Care | Payer: Self-pay | Source: Home / Self Care | Attending: Thoracic Surgery (Cardiothoracic Vascular Surgery)

## 2022-07-01 ENCOUNTER — Encounter (HOSPITAL_COMMUNITY): Payer: Self-pay | Admitting: Thoracic Surgery (Cardiothoracic Vascular Surgery)

## 2022-07-01 DIAGNOSIS — J90 Pleural effusion, not elsewhere classified: Secondary | ICD-10-CM | POA: Diagnosis present

## 2022-07-01 DIAGNOSIS — M1990 Unspecified osteoarthritis, unspecified site: Secondary | ICD-10-CM | POA: Diagnosis not present

## 2022-07-01 DIAGNOSIS — K7460 Unspecified cirrhosis of liver: Secondary | ICD-10-CM | POA: Diagnosis present

## 2022-07-01 DIAGNOSIS — I10 Essential (primary) hypertension: Secondary | ICD-10-CM | POA: Diagnosis present

## 2022-07-01 DIAGNOSIS — D696 Thrombocytopenia, unspecified: Secondary | ICD-10-CM | POA: Diagnosis present

## 2022-07-01 DIAGNOSIS — M810 Age-related osteoporosis without current pathological fracture: Secondary | ICD-10-CM | POA: Diagnosis present

## 2022-07-01 DIAGNOSIS — D509 Iron deficiency anemia, unspecified: Secondary | ICD-10-CM | POA: Diagnosis present

## 2022-07-01 DIAGNOSIS — Z853 Personal history of malignant neoplasm of breast: Secondary | ICD-10-CM

## 2022-07-01 DIAGNOSIS — G2581 Restless legs syndrome: Secondary | ICD-10-CM | POA: Diagnosis present

## 2022-07-01 DIAGNOSIS — R911 Solitary pulmonary nodule: Principal | ICD-10-CM | POA: Diagnosis present

## 2022-07-01 DIAGNOSIS — F32A Depression, unspecified: Secondary | ICD-10-CM | POA: Diagnosis present

## 2022-07-01 DIAGNOSIS — I8510 Secondary esophageal varices without bleeding: Secondary | ICD-10-CM | POA: Diagnosis present

## 2022-07-01 DIAGNOSIS — D62 Acute posthemorrhagic anemia: Secondary | ICD-10-CM | POA: Diagnosis not present

## 2022-07-01 DIAGNOSIS — Z79899 Other long term (current) drug therapy: Secondary | ICD-10-CM | POA: Diagnosis not present

## 2022-07-01 DIAGNOSIS — K766 Portal hypertension: Secondary | ICD-10-CM | POA: Diagnosis present

## 2022-07-01 DIAGNOSIS — C7802 Secondary malignant neoplasm of left lung: Secondary | ICD-10-CM | POA: Diagnosis present

## 2022-07-01 DIAGNOSIS — Z87442 Personal history of urinary calculi: Secondary | ICD-10-CM | POA: Diagnosis not present

## 2022-07-01 DIAGNOSIS — Z86718 Personal history of other venous thrombosis and embolism: Secondary | ICD-10-CM

## 2022-07-01 DIAGNOSIS — Z7901 Long term (current) use of anticoagulants: Secondary | ICD-10-CM | POA: Diagnosis not present

## 2022-07-01 DIAGNOSIS — Z23 Encounter for immunization: Secondary | ICD-10-CM

## 2022-07-01 DIAGNOSIS — I739 Peripheral vascular disease, unspecified: Secondary | ICD-10-CM | POA: Diagnosis present

## 2022-07-01 DIAGNOSIS — Z20822 Contact with and (suspected) exposure to covid-19: Secondary | ICD-10-CM | POA: Diagnosis present

## 2022-07-01 DIAGNOSIS — Z9011 Acquired absence of right breast and nipple: Secondary | ICD-10-CM

## 2022-07-01 DIAGNOSIS — Z902 Acquired absence of lung [part of]: Secondary | ICD-10-CM

## 2022-07-01 HISTORY — PX: XI ROBOTIC ASSISTED THORACOSCOPY- SEGMENTECTOMY: SHX6881

## 2022-07-01 HISTORY — PX: NODE DISSECTION: SHX5269

## 2022-07-01 HISTORY — PX: INTERCOSTAL NERVE BLOCK: SHX5021

## 2022-07-01 HISTORY — PX: VIDEO BRONCHOSCOPY WITH ENDOBRONCHIAL NAVIGATION: SHX6175

## 2022-07-01 LAB — POCT I-STAT 7, (LYTES, BLD GAS, ICA,H+H)
Acid-base deficit: 5 mmol/L — ABNORMAL HIGH (ref 0.0–2.0)
Acid-base deficit: 3 mmol/L — ABNORMAL HIGH (ref 0.0–2.0)
Bicarbonate: 22.3 mmol/L (ref 20.0–28.0)
Bicarbonate: 22.8 mmol/L (ref 20.0–28.0)
Calcium, Ion: 1.16 mmol/L (ref 1.15–1.40)
Calcium, Ion: 1.15 mmol/L (ref 1.15–1.40)
HCT: 34 % — ABNORMAL LOW (ref 36.0–46.0)
HCT: 34 % — ABNORMAL LOW (ref 36.0–46.0)
Hemoglobin: 11.6 g/dL — ABNORMAL LOW (ref 12.0–15.0)
Hemoglobin: 11.6 g/dL — ABNORMAL LOW (ref 12.0–15.0)
O2 Saturation: 100 %
O2 Saturation: 100 %
Potassium: 4.7 mmol/L (ref 3.5–5.1)
Potassium: 4.6 mmol/L (ref 3.5–5.1)
Sodium: 139 mmol/L (ref 135–145)
Sodium: 138 mmol/L (ref 135–145)
TCO2: 24 mmol/L (ref 22–32)
TCO2: 24 mmol/L (ref 22–32)
pCO2 arterial: 53.1 mmHg — ABNORMAL HIGH (ref 32–48)
pCO2 arterial: 44.9 mmHg (ref 32–48)
pH, Arterial: 7.232 — ABNORMAL LOW (ref 7.35–7.45)
pH, Arterial: 7.314 — ABNORMAL LOW (ref 7.35–7.45)
pO2, Arterial: 526 mmHg — ABNORMAL HIGH (ref 83–108)
pO2, Arterial: 388 mmHg — ABNORMAL HIGH (ref 83–108)

## 2022-07-01 SURGERY — RESECTION, LUNG, SEGMENTAL, ROBOT-ASSISTED
Anesthesia: General | Site: Chest

## 2022-07-01 MED ORDER — PHENYLEPHRINE 80 MCG/ML (10ML) SYRINGE FOR IV PUSH (FOR BLOOD PRESSURE SUPPORT)
PREFILLED_SYRINGE | INTRAVENOUS | Status: AC
  Filled 2022-07-01: qty 10

## 2022-07-01 MED ORDER — PHENYLEPHRINE HCL-NACL 20-0.9 MG/250ML-% IV SOLN
INTRAVENOUS | Status: DC | PRN
  Administered 2022-07-01: 30 ug/min via INTRAVENOUS

## 2022-07-01 MED ORDER — BUPIVACAINE LIPOSOME 1.3 % IJ SUSP
INTRAMUSCULAR | Status: AC
  Filled 2022-07-01: qty 20

## 2022-07-01 MED ORDER — FENTANYL CITRATE (PF) 250 MCG/5ML IJ SOLN
INTRAMUSCULAR | Status: AC
  Filled 2022-07-01: qty 5

## 2022-07-01 MED ORDER — ROCURONIUM BROMIDE 10 MG/ML (PF) SYRINGE
PREFILLED_SYRINGE | INTRAVENOUS | Status: DC | PRN
  Administered 2022-07-01: 20 mg via INTRAVENOUS
  Administered 2022-07-01: 30 mg via INTRAVENOUS
  Administered 2022-07-01: 20 mg via INTRAVENOUS
  Administered 2022-07-01: 50 mg via INTRAVENOUS
  Administered 2022-07-01: 30 mg via INTRAVENOUS

## 2022-07-01 MED ORDER — BISACODYL 5 MG PO TBEC
10.0000 mg | DELAYED_RELEASE_TABLET | Freq: Every day | ORAL | Status: DC
  Administered 2022-07-02 – 2022-07-05 (×2): 10 mg via ORAL
  Filled 2022-07-01 (×3): qty 2

## 2022-07-01 MED ORDER — MIDAZOLAM HCL 2 MG/2ML IJ SOLN
INTRAMUSCULAR | Status: AC
  Filled 2022-07-01: qty 2

## 2022-07-01 MED ORDER — ALBUMIN HUMAN 5 % IV SOLN: INTRAVENOUS | Status: DC | PRN

## 2022-07-01 MED ORDER — ACETAMINOPHEN 500 MG PO TABS
1000.0000 mg | ORAL_TABLET | Freq: Four times a day (QID) | ORAL | Status: DC
  Administered 2022-07-01 – 2022-07-05 (×12): 1000 mg via ORAL
  Filled 2022-07-01 (×14): qty 2

## 2022-07-01 MED ORDER — FENTANYL CITRATE (PF) 100 MCG/2ML IJ SOLN
INTRAMUSCULAR | Status: AC
  Filled 2022-07-01: qty 2

## 2022-07-01 MED ORDER — HEMOSTATIC AGENTS (NO CHARGE) OPTIME
TOPICAL | Status: DC | PRN
  Administered 2022-07-01: 1 via TOPICAL

## 2022-07-01 MED ORDER — PROPOFOL 10 MG/ML IV BOLUS
INTRAVENOUS | Status: DC | PRN
  Administered 2022-07-01: 110 mg via INTRAVENOUS

## 2022-07-01 MED ORDER — CHLORHEXIDINE GLUCONATE 0.12 % MT SOLN
15.0000 mL | Freq: Once | OROMUCOSAL | Status: AC
  Administered 2022-07-01: 15 mL via OROMUCOSAL
  Filled 2022-07-01: qty 15

## 2022-07-01 MED ORDER — LIDOCAINE 2% (20 MG/ML) 5 ML SYRINGE
INTRAMUSCULAR | Status: DC | PRN
  Administered 2022-07-01: 60 mg via INTRAVENOUS

## 2022-07-01 MED ORDER — ONDANSETRON HCL 4 MG/2ML IJ SOLN
INTRAMUSCULAR | Status: DC | PRN
  Administered 2022-07-01: 4 mg via INTRAVENOUS

## 2022-07-01 MED ORDER — MIDAZOLAM HCL 2 MG/2ML IJ SOLN
INTRAMUSCULAR | Status: DC | PRN
  Administered 2022-07-01 (×2): 1 mg via INTRAVENOUS

## 2022-07-01 MED ORDER — FENTANYL CITRATE (PF) 100 MCG/2ML IJ SOLN
25.0000 ug | INTRAMUSCULAR | Status: DC | PRN
  Administered 2022-07-01: 25 ug via INTRAVENOUS

## 2022-07-01 MED ORDER — VENLAFAXINE HCL ER 150 MG PO CP24
150.0000 mg | ORAL_CAPSULE | Freq: Every day | ORAL | Status: DC
  Administered 2022-07-02 – 2022-07-05 (×4): 150 mg via ORAL
  Filled 2022-07-01 (×4): qty 1

## 2022-07-01 MED ORDER — METHYLENE BLUE 1 % INJ SOLN
INTRAVENOUS | Status: AC
Start: 2022-07-01 — End: ?
  Filled 2022-07-01: qty 10

## 2022-07-01 MED ORDER — PROPOFOL 10 MG/ML IV BOLUS
INTRAVENOUS | Status: AC
Start: 2022-07-01 — End: ?
  Filled 2022-07-01: qty 20

## 2022-07-01 MED ORDER — BUPIVACAINE HCL (PF) 0.5 % IJ SOLN
INTRAMUSCULAR | Status: AC
  Filled 2022-07-01: qty 30

## 2022-07-01 MED ORDER — 0.9 % SODIUM CHLORIDE (POUR BTL) OPTIME
TOPICAL | Status: DC | PRN
  Administered 2022-07-01: 2000 mL

## 2022-07-01 MED ORDER — ACETAMINOPHEN 160 MG/5ML PO SOLN: 1000.0000 mg | Freq: Four times a day (QID) | ORAL | Status: DC

## 2022-07-01 MED ORDER — FENTANYL CITRATE (PF) 250 MCG/5ML IJ SOLN
INTRAMUSCULAR | Status: DC | PRN
  Administered 2022-07-01 (×2): 50 ug via INTRAVENOUS
  Administered 2022-07-01: 100 ug via INTRAVENOUS
  Administered 2022-07-01: 50 ug via INTRAVENOUS

## 2022-07-01 MED ORDER — LIDOCAINE 2% (20 MG/ML) 5 ML SYRINGE
INTRAMUSCULAR | Status: AC
  Filled 2022-07-01: qty 5

## 2022-07-01 MED ORDER — SUGAMMADEX SODIUM 200 MG/2ML IV SOLN
INTRAVENOUS | Status: DC | PRN
  Administered 2022-07-01: 200 mg via INTRAVENOUS

## 2022-07-01 MED ORDER — SODIUM CHLORIDE FLUSH 0.9 % IV SOLN
INTRAVENOUS | Status: DC | PRN
  Administered 2022-07-01: 100 mL

## 2022-07-01 MED ORDER — ONDANSETRON HCL 4 MG/2ML IJ SOLN: 4.0000 mg | Freq: Four times a day (QID) | INTRAMUSCULAR | Status: DC | PRN

## 2022-07-01 MED ORDER — LETROZOLE 2.5 MG PO TABS
2.5000 mg | ORAL_TABLET | Freq: Every day | ORAL | Status: DC
  Administered 2022-07-02 – 2022-07-05 (×4): 2.5 mg via ORAL
  Filled 2022-07-01 (×5): qty 1

## 2022-07-01 MED ORDER — AMISULPRIDE (ANTIEMETIC) 5 MG/2ML IV SOLN: 10.0000 mg | Freq: Once | INTRAVENOUS | Status: DC | PRN

## 2022-07-01 MED ORDER — HEMOSTATIC AGENTS (NO CHARGE) OPTIME
TOPICAL | Status: DC | PRN
  Administered 2022-07-01: 2 via TOPICAL

## 2022-07-01 MED ORDER — PHENYLEPHRINE 80 MCG/ML (10ML) SYRINGE FOR IV PUSH (FOR BLOOD PRESSURE SUPPORT)
PREFILLED_SYRINGE | INTRAVENOUS | Status: DC | PRN
  Administered 2022-07-01 (×2): 80 ug via INTRAVENOUS

## 2022-07-01 MED ORDER — ROCURONIUM BROMIDE 10 MG/ML (PF) SYRINGE
PREFILLED_SYRINGE | INTRAVENOUS | Status: AC
  Filled 2022-07-01: qty 10

## 2022-07-01 MED ORDER — SENNOSIDES-DOCUSATE SODIUM 8.6-50 MG PO TABS
1.0000 | ORAL_TABLET | Freq: Every day | ORAL | Status: DC
Start: 2022-07-01 — End: 2022-07-05
  Administered 2022-07-02 – 2022-07-03 (×2): 1 via ORAL
  Filled 2022-07-01 (×2): qty 1

## 2022-07-01 MED ORDER — SODIUM CHLORIDE 0.9 % IV SOLN: INTRAVENOUS | Status: DC

## 2022-07-01 MED ORDER — OXYCODONE HCL 5 MG PO TABS
5.0000 mg | ORAL_TABLET | ORAL | Status: DC | PRN
  Administered 2022-07-01 – 2022-07-02 (×3): 5 mg via ORAL
  Filled 2022-07-01 (×3): qty 1

## 2022-07-01 MED ORDER — ENOXAPARIN SODIUM 40 MG/0.4ML IJ SOSY
40.0000 mg | PREFILLED_SYRINGE | Freq: Every day | INTRAMUSCULAR | Status: DC
Start: 2022-07-02 — End: 2022-07-03
  Administered 2022-07-02: 40 mg via SUBCUTANEOUS
  Filled 2022-07-01: qty 0.4

## 2022-07-01 MED ORDER — FOLIC ACID 1 MG PO TABS
1.0000 mg | ORAL_TABLET | Freq: Every day | ORAL | Status: DC
  Administered 2022-07-02 – 2022-07-05 (×4): 1 mg via ORAL
  Filled 2022-07-01 (×4): qty 1

## 2022-07-01 MED ORDER — INDOCYANINE GREEN 25 MG IV SOLR
INTRAVENOUS | Status: AC
  Filled 2022-07-01: qty 10

## 2022-07-01 MED ORDER — CEFAZOLIN SODIUM-DEXTROSE 2-4 GM/100ML-% IV SOLN
2.0000 g | INTRAVENOUS | Status: AC
  Administered 2022-07-01: 2 g via INTRAVENOUS
  Filled 2022-07-01: qty 100

## 2022-07-01 MED ORDER — LACTATED RINGERS IV SOLN: INTRAVENOUS | Status: DC | PRN

## 2022-07-01 MED ORDER — DEXAMETHASONE SODIUM PHOSPHATE 10 MG/ML IJ SOLN
INTRAMUSCULAR | Status: AC
Start: 2022-07-01 — End: ?
  Filled 2022-07-01: qty 1

## 2022-07-01 MED ORDER — PROPRANOLOL HCL 20 MG PO TABS
20.0000 mg | ORAL_TABLET | Freq: Two times a day (BID) | ORAL | Status: DC
  Administered 2022-07-02 – 2022-07-05 (×7): 20 mg via ORAL
  Filled 2022-07-01 (×7): qty 1

## 2022-07-01 MED ORDER — CEFAZOLIN SODIUM-DEXTROSE 2-4 GM/100ML-% IV SOLN
2.0000 g | Freq: Three times a day (TID) | INTRAVENOUS | Status: AC
  Administered 2022-07-01 – 2022-07-02 (×2): 2 g via INTRAVENOUS
  Filled 2022-07-01 (×2): qty 100

## 2022-07-01 MED ORDER — METHYLENE BLUE 1 % INJ SOLN
INTRAVENOUS | Status: DC | PRN
  Administered 2022-07-01: .5 mL

## 2022-07-01 MED ORDER — MORPHINE SULFATE (PF) 2 MG/ML IV SOLN
2.0000 mg | INTRAVENOUS | Status: DC | PRN
  Administered 2022-07-01: 2 mg via INTRAVENOUS
  Filled 2022-07-01: qty 1

## 2022-07-01 MED ORDER — ORAL CARE MOUTH RINSE: 15.0000 mL | Freq: Once | OROMUCOSAL | Status: AC

## 2022-07-01 MED ORDER — LACTATED RINGERS IV SOLN: INTRAVENOUS | Status: DC

## 2022-07-01 MED ORDER — DEXAMETHASONE SODIUM PHOSPHATE 10 MG/ML IJ SOLN
INTRAMUSCULAR | Status: DC | PRN
  Administered 2022-07-01: 10 mg via INTRAVENOUS

## 2022-07-01 MED ORDER — ONDANSETRON HCL 4 MG/2ML IJ SOLN
INTRAMUSCULAR | Status: AC
  Filled 2022-07-01: qty 2

## 2022-07-01 MED ORDER — SODIUM CHLORIDE 0.9 % IR SOLN
Status: DC | PRN
  Administered 2022-07-01: 1000 mL

## 2022-07-01 MED ORDER — TRAMADOL HCL 50 MG PO TABS
50.0000 mg | ORAL_TABLET | Freq: Four times a day (QID) | ORAL | Status: DC | PRN
  Administered 2022-07-01: 50 mg via ORAL
  Administered 2022-07-03 – 2022-07-04 (×2): 100 mg via ORAL
  Filled 2022-07-01 (×2): qty 2
  Filled 2022-07-01: qty 1

## 2022-07-01 MED ORDER — EPHEDRINE SULFATE-NACL 50-0.9 MG/10ML-% IV SOSY
PREFILLED_SYRINGE | INTRAVENOUS | Status: DC | PRN
  Administered 2022-07-01: 5 mg via INTRAVENOUS

## 2022-07-01 MED ORDER — EPHEDRINE 5 MG/ML INJ
INTRAVENOUS | Status: AC
Start: 2022-07-01 — End: ?
  Filled 2022-07-01: qty 5

## 2022-07-01 SURGICAL SUPPLY — 132 items
ADAPTER BRONCHOSCOPE OLYMP 190 (ADAPTER) ×1 IMPLANT
ADAPTER VALVE BIOPSY EBUS (MISCELLANEOUS) IMPLANT
ADPTR VALVE BIOPSY EBUS (MISCELLANEOUS)
APPLIER CLIP ROT 10 11.4 M/L (STAPLE)
BRUSH BIOPSY BRONCH 10 SDTNB (MISCELLANEOUS) IMPLANT
BRUSH SUPERTRAX BIOPSY (INSTRUMENTS) IMPLANT
CANISTER SUCT 3000ML PPV (MISCELLANEOUS) ×6 IMPLANT
CANNULA REDUC XI 12-8 STAPL (CANNULA) ×6
CANNULA REDUCER 12-8 DVNC XI (CANNULA) ×4 IMPLANT
CLIP APPLIE ROT 10 11.4 M/L (STAPLE) IMPLANT
CLIP LIGATING HEM O LOK PURPLE (MISCELLANEOUS) ×3 IMPLANT
CLIP LIGATING HEMO O LOK GREEN (MISCELLANEOUS) ×1 IMPLANT
CNTNR URN SCR LID CUP LEK RST (MISCELLANEOUS) ×10 IMPLANT
CONN ST 1/4X3/8  BEN (MISCELLANEOUS) ×2
CONN ST 1/4X3/8 BEN (MISCELLANEOUS) IMPLANT
CONT SPEC 4OZ STRL OR WHT (MISCELLANEOUS) ×28
COVER BACK TABLE 60X90IN (DRAPES) ×3 IMPLANT
DEFOGGER SCOPE WARMER CLEARIFY (MISCELLANEOUS) ×3 IMPLANT
DERMABOND ADVANCED (GAUZE/BANDAGES/DRESSINGS) ×1
DERMABOND ADVANCED .7 DNX12 (GAUZE/BANDAGES/DRESSINGS) ×2 IMPLANT
DRAIN CHANNEL 28F RND 3/8 FF (WOUND CARE) ×1 IMPLANT
DRAIN CHANNEL 32F RND 10.7 FF (WOUND CARE) IMPLANT
DRAPE ARM DVNC X/XI (DISPOSABLE) ×8 IMPLANT
DRAPE COLUMN DVNC XI (DISPOSABLE) ×2 IMPLANT
DRAPE CV SPLIT W-CLR ANES SCRN (DRAPES) ×3 IMPLANT
DRAPE DA VINCI XI ARM (DISPOSABLE) ×12
DRAPE DA VINCI XI COLUMN (DISPOSABLE) ×2
DRAPE HALF SHEET 40X57 (DRAPES) ×3 IMPLANT
DRAPE ORTHO SPLIT 77X108 STRL (DRAPES) ×3
DRAPE SURG ORHT 6 SPLT 77X108 (DRAPES) ×2 IMPLANT
ELECT BLADE 6.5 EXT (BLADE) IMPLANT
ELECT REM PT RETURN 9FT ADLT (ELECTROSURGICAL) ×3
ELECTRODE REM PT RTRN 9FT ADLT (ELECTROSURGICAL) ×2 IMPLANT
FILTER STRAW FLUID ASPIR (MISCELLANEOUS) ×3 IMPLANT
FORCEPS BIOP SUPERTRX PREMAR (INSTRUMENTS) IMPLANT
GAUZE KITTNER 4X5 RF (MISCELLANEOUS) ×5 IMPLANT
GAUZE SPONGE 4X4 12PLY STRL (GAUZE/BANDAGES/DRESSINGS) ×3 IMPLANT
GLOVE SURG MICRO LTX SZ7.5 (GLOVE) ×6 IMPLANT
GOWN STRL REUS W/ TWL LRG LVL3 (GOWN DISPOSABLE) ×4 IMPLANT
GOWN STRL REUS W/ TWL XL LVL3 (GOWN DISPOSABLE) ×6 IMPLANT
GOWN STRL REUS W/TWL 2XL LVL3 (GOWN DISPOSABLE) ×5 IMPLANT
GOWN STRL REUS W/TWL LRG LVL3 (GOWN DISPOSABLE) ×10
GOWN STRL REUS W/TWL XL LVL3 (GOWN DISPOSABLE) ×6
HANDLE STAPLE  ENDO EGIA 4 STD (STAPLE) ×3
HANDLE STAPLE ENDO EGIA 4 STD (STAPLE) IMPLANT
HEMOSTAT SURGICEL 2X14 (HEMOSTASIS) ×11 IMPLANT
IRRIGATION STRYKERFLOW (MISCELLANEOUS) ×2 IMPLANT
IRRIGATOR STRYKERFLOW (MISCELLANEOUS) ×3
KIT BASIN OR (CUSTOM PROCEDURE TRAY) ×3 IMPLANT
KIT CLEAN ENDO COMPLIANCE (KITS) ×3 IMPLANT
KIT ILLUMISITE 180 PROCEDURE (KITS) ×1 IMPLANT
KIT TURNOVER KIT B (KITS) ×3 IMPLANT
MARKER SKIN DUAL TIP RULER LAB (MISCELLANEOUS) ×3 IMPLANT
NDL HYPO 25GX1X1/2 BEV (NEEDLE) ×2 IMPLANT
NDL SUPERTRX PREMARK BIOPSY (NEEDLE) IMPLANT
NEEDLE HYPO 25GX1X1/2 BEV (NEEDLE) ×3 IMPLANT
NEEDLE SUPERTRX PREMARK BIOPSY (NEEDLE) ×3 IMPLANT
NS IRRIG 1000ML POUR BTL (IV SOLUTION) ×5 IMPLANT
OIL SILICONE PENTAX (PARTS (SERVICE/REPAIRS)) ×3 IMPLANT
PACK CHEST (CUSTOM PROCEDURE TRAY) ×3 IMPLANT
PAD ARMBOARD 7.5X6 YLW CONV (MISCELLANEOUS) ×6 IMPLANT
PATCHES PATIENT (LABEL) ×9 IMPLANT
PORT ACCESS TROCAR AIRSEAL 12 (TROCAR) IMPLANT
PORT ACCESS TROCAR AIRSEAL 5M (TROCAR)
PROGEL SPRAY TIP 11IN (MISCELLANEOUS) ×3
RELOAD EGIA 45 MED/THCK PURPLE (STAPLE) ×3 IMPLANT
RELOAD STAPLE 45 2.5 WHT DVNC (STAPLE) IMPLANT
RELOAD STAPLE 45 3.5 BLU DVNC (STAPLE) IMPLANT
RELOAD STAPLE 45 4.3 GRN DVNC (STAPLE) IMPLANT
RELOAD STAPLE 45 4.6 BLK DVNC (STAPLE) IMPLANT
RELOAD STAPLER 2.5X45 WHT DVNC (STAPLE) ×4 IMPLANT
RELOAD STAPLER 3.5X45 BLU DVNC (STAPLE) ×10 IMPLANT
RELOAD STAPLER 4.3X45 GRN DVNC (STAPLE) ×8 IMPLANT
RELOAD STAPLER 45 4.6 BLK DVNC (STAPLE) ×2 IMPLANT
SCISSORS LAP 5X35 DISP (ENDOMECHANICALS) IMPLANT
SEAL CANN UNIV 5-8 DVNC XI (MISCELLANEOUS) ×4 IMPLANT
SEAL XI 5MM-8MM UNIVERSAL (MISCELLANEOUS) ×4
SEALANT PROGEL (MISCELLANEOUS) ×1 IMPLANT
SET TRI-LUMEN FLTR TB AIRSEAL (TUBING) ×3 IMPLANT
SHEARS HARMONIC HDI 20CM (ELECTROSURGICAL) IMPLANT
SOLUTION ELECTROLUBE (MISCELLANEOUS) ×3 IMPLANT
SPONGE INTESTINAL PEANUT (DISPOSABLE) ×1 IMPLANT
SPONGE T-LAP 18X18 ~~LOC~~+RFID (SPONGE) ×6 IMPLANT
STAPLER 45 SUREFORM CVD (STAPLE) ×2
STAPLER 45 SUREFORM CVD DVNC (STAPLE) IMPLANT
STAPLER CANNULA SEAL DVNC XI (STAPLE) ×4 IMPLANT
STAPLER CANNULA SEAL XI (STAPLE) ×6
STAPLER RELOAD 2.5X45 WHITE (STAPLE) ×4
STAPLER RELOAD 2.5X45 WHT DVNC (STAPLE) ×4
STAPLER RELOAD 3.5X45 BLU DVNC (STAPLE) ×10
STAPLER RELOAD 3.5X45 BLUE (STAPLE) ×15
STAPLER RELOAD 4.3X45 GREEN (STAPLE) ×12
STAPLER RELOAD 4.3X45 GRN DVNC (STAPLE) ×8
STAPLER RELOAD 45 4.6 BLK (STAPLE) ×2
STAPLER RELOAD 45 4.6 BLK DVNC (STAPLE) ×2
SUT PDS AB 3-0 SH 27 (SUTURE) IMPLANT
SUT PROLENE 4 0 RB 1 (SUTURE)
SUT PROLENE 4-0 RB1 .5 CRCL 36 (SUTURE) IMPLANT
SUT SILK  1 MH (SUTURE) ×6
SUT SILK 1 MH (SUTURE) ×4 IMPLANT
SUT SILK 1 TIES 10X30 (SUTURE) ×3 IMPLANT
SUT SILK 2 0 SH (SUTURE) ×4 IMPLANT
SUT SILK 2 0SH CR/8 30 (SUTURE) IMPLANT
SUT SILK 3 0SH CR/8 30 (SUTURE) IMPLANT
SUT VIC AB 1 CTX 36 (SUTURE) ×2
SUT VIC AB 1 CTX36XBRD ANBCTR (SUTURE) IMPLANT
SUT VIC AB 2-0 CTX 36 (SUTURE) ×1 IMPLANT
SUT VIC AB 3-0 MH 27 (SUTURE) IMPLANT
SUT VIC AB 3-0 X1 27 (SUTURE) ×5 IMPLANT
SUT VICRYL 0 TIES 12 18 (SUTURE) ×3 IMPLANT
SUT VICRYL 0 UR6 27IN ABS (SUTURE) ×7 IMPLANT
SUT VICRYL 2 TP 1 (SUTURE) IMPLANT
SYR 20CC LL (SYRINGE) ×6 IMPLANT
SYR 20ML ECCENTRIC (SYRINGE) ×3 IMPLANT
SYR 20ML LL LF (SYRINGE) ×6 IMPLANT
SYR 30ML LL (SYRINGE) IMPLANT
SYR 5ML LL (SYRINGE) ×3 IMPLANT
SYSTEM RETRIEVAL ANCHOR 12 (MISCELLANEOUS) ×2 IMPLANT
SYSTEM SAHARA CHEST DRAIN ATS (WOUND CARE) ×3 IMPLANT
TAPE CLOTH 4X10 WHT NS (GAUZE/BANDAGES/DRESSINGS) ×3 IMPLANT
TAPE CLOTH SURG 4X10 WHT LF (GAUZE/BANDAGES/DRESSINGS) ×1 IMPLANT
TIP APPLICATOR SPRAY EXTEND 16 (VASCULAR PRODUCTS) IMPLANT
TIP SPRAY PROGEL 11IN (MISCELLANEOUS) IMPLANT
TOWEL GREEN STERILE (TOWEL DISPOSABLE) ×3 IMPLANT
TOWEL GREEN STERILE FF (TOWEL DISPOSABLE) ×3 IMPLANT
TRAY FOLEY MTR SLVR 16FR STAT (SET/KITS/TRAYS/PACK) ×3 IMPLANT
TUBE CONNECTING 20X1/4 (TUBING) ×6 IMPLANT
UNDERPAD 30X36 HEAVY ABSORB (UNDERPADS AND DIAPERS) ×3 IMPLANT
VALVE BIOPSY  SINGLE USE (MISCELLANEOUS) ×2
VALVE BIOPSY SINGLE USE (MISCELLANEOUS) ×2 IMPLANT
VALVE SUCTION BRONCHIO DISP (MISCELLANEOUS) ×3 IMPLANT
WATER STERILE IRR 1000ML POUR (IV SOLUTION) ×3 IMPLANT

## 2022-07-01 NOTE — Transfer of Care (Signed)
Immediate Anesthesia Transfer of Care Note  Patient: Nicole [name]  Procedure(s) Performed: XI ROBOTIC ASSISTED THORACOSCOPY-LEFT LOWER LOBE BASILAR SEGMENTECTOMY (Left: Chest) VIDEO BRONCHOSCOPY WITH ENDOBRONCHIAL NAVIGATION INTERCOSTAL NERVE BLOCK (Left: Chest) NODE DISSECTION (Left: Chest)  Patient Location: PACU  Anesthesia Type:General  Level of Consciousness: drowsy  Airway & Oxygen Therapy: Patient Spontanous Breathing  Post-op Assessment: Report given to RN and Post -op Vital signs reviewed and stable  Post vital signs: Reviewed and stable  Last Vitals:  Vitals Value Taken Time  BP 99/59 07/01/22 1322  Temp    Pulse 68 07/01/22 1325  Resp 12 07/01/22 1326  SpO2 100 % 07/01/22 1325  Vitals shown include unvalidated device data.  Last Pain:  Vitals:   07/01/22 0622  TempSrc:   PainSc: 0-No pain         Complications: No notable events documented.
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
## 2022-07-01 NOTE — Op Note (Signed)
Nicole [name] MEDICAL RECORD NO: [id number] ACCOUNT NO: [id number] DATE OF BIRTH: 29-Nov-1956 FACILITY: [facility] LOCATION: [facility] PHYSICIAN: Revonda Standard. Roxan Hockey, MD  Operative Report   DATE OF PROCEDURE: 07/01/2022  PREOPERATIVE DIAGNOSIS:  Left lower lobe pulmonary nodule.  POSTOPERATIVE DIAGNOSIS:  Left lower lobe pulmonary nodule, probable carcinoid tumor, clinical stage IA (T1, N0).  PROCEDURE:   Electromagnetic navigational bronchoscopy for tumor marking.   Xi robotic-assisted left lower lobe basilar segmentectomy,  Lymph node sampling and  Intercostal nerve blocks levels 3 through 10.  SURGEON:  Revonda Standard. Roxan Hockey, MD  ASSISTANT:  Enid Cutter, PA-c.  ANESTHESIA:  General.  FINDINGS:  Frozen section revealed probable neuroendocrine tumor.  Initial margin involved. Second margin free of tumor.  CLINICAL NOTE:  Nicole [name] is a 66 year old woman with a history of a stage IV breast cancer, portal vein thrombosis, portal hypertension, esophageal varices and a known left lower lobe lung nodule.  She has had a left lower lobe lung nodule that has been slowly  growing over several years.  She also had moderate right pleural effusion.  She was sent for thoracentesis, but there was not adequate fluid to safely perform thoracentesis at the time of her ultrasound.  She was advised to undergo surgical resection of  the tumor, which was felt to most likely be a carcinoid tumor.  The indications, risks, benefits, and alternatives were discussed in detail with the patient.  She understood and accepted the risks and agreed to proceed.  OPERATIVE NOTE:  Nicole [name] was brought to the preoperative holding area on 07/01/2022.  Anesthesia placed an arterial blood pressure monitoring line and established intravenous access.  She was taken to the operating room, anesthetized and intubated.   Planning for the navigational bronchoscopy was done on the console prior to induction.  After  intubation, a Foley catheter was placed.  Sequential compression devices were placed on the calves for DVT prophylaxis and intravenous antibiotics were  administered.  A timeout was performed.  Flexible fiberoptic bronchoscopy was performed via the endotracheal tube, it revealed normal endobronchial anatomy to the level of subsegmental bronchi with no endobronchial lesions seen.  The locatable guide for navigation was  placed.  Registration was performed.  The locatable guide then was advanced to the left lower lobe bronchus and the appropriate subsegmental bronchus was cannulated.  This was one of the basilar segmental bronchi.  The catheter was advanced within a  centimeter and half of the nodule with good alignment.  Fluoroscopy was used to confirm the position of the catheter, 0.5 mL of a 50:50 solution containing methylene blue and ICG was injected at the tumor site.  Fluoroscopy was used during the injection.   The total fluoroscopy time was less than a minute with a total dose of 5 milligray.  The bronchoscope was withdrawn.  The patient then was reintubated with a double lumen endotracheal tube.  She was placed in a right lateral decubitus position.  A Bair hugger was placed for active warning.  The left chest was prepped and draped in the usual sterile fashion.  A timeout was performed.  A solution containing 20 mL of liposomal bupivacaine, 30 mL of 0.5% bupivacaine and 50 mL of saline was prepared.  This solution was used for local at the incision sites as well as for the intercostal nerve blocks.  An incision was made in  the eighth interspace in the midaxillary line, and an 8 mm port was inserted.  The thoracoscope was advanced  into the chest.  After confirming intrapleural placement, carbon dioxide was insufflated per protocol.  A 12 mm robotic port was placed in the eighth  interspace anterior to the camera port and a 12 mm AirSeal port was placed in the tenth interspace centered between the  two anterior ports.  Intercostal nerve blocks then were performed from the third to the tenth interspace with 10 mL of the bupivacaine  solution injected into a subpleural plane at each level.  Two additional eighth interspace ports then were placed for the robot.  The robot was deployed.  The camera arm was docked, targeting was performed.  The remaining arms were docked.  Robotic  instruments were inserted with thoracoscopic visualization.  From the console, the [facility] setting was activated and the nodule was evident in the superior part of the basilar posterior segment of the lower lobe.  Inspection at the inferior ligament showed some enlarged veins and inflammatory changes, likely due to  her previous esophageal procedures. Dissection in that area was not done at this time, the pleural reflection was divided at the hilum posteriorly.  As the dissection proceeded lymph nodes were removed as they were encountered, and all nodes were sent as separate specimins.  Level 7 nodes were  removed.  Working superiorly, the aortopulmonary window was opened and a level 10 and level 5 nodes were removed and then working anteriorly the pleural reflection was divided and level 11 nodes were removed.  There were some adhesions of the upper lobe  to the anterior mediastinum that were taken down prior to the anterior dissection.  The fissure was completed anteriorly with a single firing of the stapler. The pulmonary artery was identified.  The fissure was completed with sequential firings of the  robotic stapler working along the pulmonary artery plane. Lymph nodes were removed from around the vessels and airways.  The superior segmental artery was a large branch that arose separately.  The basilar trunk was encircled and was divided with the  robotic stapler.  A level 12 node was removed and the basilar segmental bronchus was identified.  The bronchus was encircled, and the stapler was placed across the bronchus and  closed.  A test inflation showed good aeration of the superior segment as well as the upper  lobe.  The stapler was fired transecting the bronchus.  Toggling between normal view and the Firefly view the nodule was noted to be in atelectatic basilar segment.  The stapler then was used to complete the basilar segmentectomy with sequential firings  of the stapler.  The superior segmental vein was identified and preserved.  The basilar segmental veins were divided with the robotic stapler.  Specimen was placed into an endoscopic retrieval bag.  The chest was copiously irrigated with warm saline.  A  test inflation to 30 cm water revealed no air leak from the bronchial stump.  There was some leakage from along the staple line.  The robotic instruments were removed.  The robot was undocked.  The anterior eighth interspace incision was lengthened to  approximately 3 cm.  A 12 mm endoscopic retrieval bag was placed into the chest through this incision.  The basilar segmental specimen was manipulated into the bag, which was removed and sent for frozen section. The nodule was noted to be relatively  close to the stapled margin.  The Frozen section revealed probable carcinoid tumor with tumor cells at the margin.  An additional margin was obtained using a Psychologist, clinical and both  the old and new margins were marked.  The new specimen was sent for frozen section and showed no tumor  cells present.  The vessel loop and sponges that had been placed were all removed.  Test inflation showed good aeration of the superior segment and the upper lobe.  A 28-French Blake drain was placed through the tenth interspace incision and directed to  the apex.  It was secured with #1 silk suture.  Dual lung ventilation was resumed.  The wounds were closed in standard fashion.  All sponge, needle and instrument counts were correct at the end of the procedure.  The patient was placed back in supine  position.  She was extubated in the  operating room and taken to the postanesthetic care unit in good condition.    Experienced assistance was necessary for this case.  Enid Cutter assisted with port placement, docking of the robot, instrument exchange, specimen retrieval, suctioning, and wound closure.   PUS D: 07/01/2022 6:28:14 pm T: 07/01/2022 8:59:00 pm  JOB: [id number]/ [id number]

## 2022-07-01 NOTE — Interval H&P Note (Signed)
History and Physical Interval Note:  07/01/2022 7:22 AM  Nicole [name]  has presented today for surgery, with the diagnosis of LLL NODULE.  The various methods of treatment have been discussed with the patient and family. After consideration of risks, benefits and other options for treatment, the patient has consented to  Procedure(s): XI ROBOTIC ASSISTED THORACOSCOPY-LEFT LOWER LOBE SEGMENTECTOMY (Left) VIDEO BRONCHOSCOPY WITH ENDOBRONCHIAL NAVIGATION (N/A) as a surgical intervention.  The patient's history has been reviewed, patient examined, no change in status, stable for surgery.  I have reviewed the patient's chart and labs.  Questions were answered to the patient's satisfaction.     [name]

## 2022-07-01 NOTE — Discharge Instructions (Signed)

## 2022-07-01 NOTE — Brief Op Note (Addendum)
07/01/2022  12:52 PM  PATIENT:  Nicole [name]  66 y.o. female  PRE-OPERATIVE DIAGNOSIS:  LEFT LOWER LOBE PULMONARY NODULE  POST-OPERATIVE DIAGNOSIS:  LEFT LOWER LOBE PULMONARY NODULE, PROBABLE CARCINOID TUMOR (Clinical stage IA (T1,N0))  PROCEDURE:  XI ROBOTIC ASSISTED THORACOSCOPY-LEFT LOWER LOBE BASILAR SEGMENTECTOMY (Left) VIDEO BRONCHOSCOPY WITH ENDOBRONCHIAL NAVIGATION (N/A) INTERCOSTAL NERVE BLOCK (Left) LYMPH NODE SAMPLING (Left)  SURGEON:  [name], MD - Primary  PHYSICIAN ASSISTANT: Enid Cutter, PA  ASSISTANTS: Harrel Lemon, RN, Circulator Assistant   ANESTHESIA:   general  EBL:  200 mL   BLOOD ADMINISTERED:none  DRAINS:  83f Blake Drain    LOCAL MEDICATIONS USED:  Exparel local and intercostal  SPECIMEN:     Left lower lobe basilar segment and multiple lymph nodes  DISPOSITION OF SPECIMEN:  PATHOLOGY  COUNTS:  Correct  DICTATION: .Dragon Dictation  PLAN OF CARE: Admit to inpatient   PATIENT DISPOSITION:  PACU - hemodynamically stable.   Delay start of Pharmacological VTE agent (>24hrs) due to surgical blood loss or risk of bleeding: yes

## 2022-07-01 NOTE — Anesthesia Procedure Notes (Signed)
Arterial Line Insertion Start/End7/02/2022 7:00 AM, 07/01/2022 7:10 AM Performed by: CRNA  Patient location: Pre-op. Preanesthetic checklist: patient identified, IV checked, site marked, risks and benefits discussed, surgical consent, monitors and equipment checked, pre-op evaluation, timeout performed and anesthesia consent Lidocaine 1% used for infiltration Left, radial was placed Catheter size: 20 G Hand hygiene performed  and maximum sterile barriers used   Attempts: 2 Procedure performed without using ultrasound guided technique. Following insertion, dressing applied and Biopatch. Post procedure assessment: normal and unchanged

## 2022-07-01 NOTE — Anesthesia Procedure Notes (Addendum)
Procedure Name: Intubation Date/Time: 07/01/2022 7:44 AM  Performed by: Erick Colace, CRNAPre-anesthesia Checklist: Patient identified, Emergency Drugs available, Suction available and Patient being monitored Patient Re-evaluated:Patient Re-evaluated prior to induction Oxygen Delivery Method: Circle system utilized Preoxygenation: Pre-oxygenation with 100% oxygen Induction Type: IV induction Ventilation: Mask ventilation without difficulty Laryngoscope Size: Mac and 3 Grade View: Grade I Tube type: Oral Tube size: 8.5 mm Number of attempts: 1 Airway Equipment and Method: Stylet and Oral airway Placement Confirmation: ETT inserted through vocal cords under direct vision, positive ETCO2 and breath sounds checked- equal and bilateral Secured at: 21 cm Tube secured with: Tape Dental Injury: Teeth and Oropharynx as per pre-operative assessment

## 2022-07-01 NOTE — Anesthesia Procedure Notes (Addendum)
Procedure Name: Intubation Date/Time: 07/01/2022 8:26 AM  Performed by: Erick Colace, CRNAPre-anesthesia Checklist: Patient identified, Emergency Drugs available, Suction available and Patient being monitored Patient Re-evaluated:Patient Re-evaluated prior to induction Oxygen Delivery Method: Circle system utilized Preoxygenation: Pre-oxygenation with 100% oxygen Induction Type: IV induction Ventilation: Mask ventilation without difficulty Laryngoscope Size: Mac and 3 Grade View: Grade I Tube type: Oral Endobronchial tube: Double lumen EBT, Left, EBT position confirmed by fiberoptic bronchoscope and EBT position confirmed by auscultation and 37 Fr Number of attempts: 1 Airway Equipment and Method: Stylet Placement Confirmation: ETT inserted through vocal cords under direct vision, positive ETCO2 and breath sounds checked- equal and bilateral Tube secured with: Tape Dental Injury: Teeth and Oropharynx as per pre-operative assessment

## 2022-07-01 NOTE — Anesthesia Postprocedure Evaluation (Signed)
Anesthesia Post Note  Patient: Nicole [name]  Procedure(s) Performed: XI ROBOTIC ASSISTED THORACOSCOPY-LEFT LOWER LOBE BASILAR SEGMENTECTOMY (Left: Chest) VIDEO BRONCHOSCOPY WITH ENDOBRONCHIAL NAVIGATION INTERCOSTAL NERVE BLOCK (Left: Chest) NODE DISSECTION (Left: Chest)     Patient location during evaluation: PACU Anesthesia Type: General Level of consciousness: awake and alert Pain management: pain level controlled Vital Signs Assessment: post-procedure vital signs reviewed and stable Respiratory status: spontaneous breathing, nonlabored ventilation, respiratory function stable and patient connected to nasal cannula oxygen Cardiovascular status: blood pressure returned to baseline and stable Postop Assessment: no apparent nausea or vomiting Anesthetic complications: no   No notable events documented.  Last Vitals:  Vitals:   07/01/22 1407 07/01/22 1415  BP: (!) 96/57 105/70  Pulse: 71 73  Resp: 10 14  Temp: (!) 36.4 C   SpO2: 98% 98%    Last Pain:  Vitals:   07/01/22 1500  TempSrc:   PainSc: Asleep                 Tiajuana Amass

## 2022-07-02 ENCOUNTER — Encounter (HOSPITAL_COMMUNITY): Payer: Self-pay | Admitting: Thoracic Surgery (Cardiothoracic Vascular Surgery)

## 2022-07-02 ENCOUNTER — Other Ambulatory Visit: Payer: Self-pay

## 2022-07-02 ENCOUNTER — Inpatient Hospital Stay (HOSPITAL_COMMUNITY): Payer: Medicare Other

## 2022-07-02 LAB — BASIC METABOLIC PANEL
Anion gap: 11 (ref 5–15)
BUN: 20 mg/dL (ref 8–23)
CO2: 19 mmol/L — ABNORMAL LOW (ref 22–32)
Calcium: 7.8 mg/dL — ABNORMAL LOW (ref 8.9–10.3)
Chloride: 109 mmol/L (ref 98–111)
Creatinine, Ser: 1 mg/dL (ref 0.44–1.00)
GFR, Estimated: >60 mL/min (ref >60)
Glucose, Bld: 143 mg/dL — ABNORMAL HIGH (ref 70–99)
Potassium: 4.4 mmol/L (ref 3.5–5.1)
Sodium: 139 mmol/L (ref 135–145)

## 2022-07-02 MED ORDER — PNEUMOCOCCAL 20-VAL CONJ VACC 0.5 ML IM SUSY
0.5000 mL | PREFILLED_SYRINGE | INTRAMUSCULAR | Status: AC
  Administered 2022-07-05: 0.5 mL via INTRAMUSCULAR
  Filled 2022-07-02: qty 0.5

## 2022-07-02 MED ORDER — KETOROLAC TROMETHAMINE 15 MG/ML IJ SOLN
15.0000 mg | Freq: Four times a day (QID) | INTRAMUSCULAR | Status: DC | PRN
Start: 2022-07-02 — End: 2022-07-05

## 2022-07-02 NOTE — Progress Notes (Addendum)
      [address]       [place] [address]             [phone]       1 Day Post-Op Procedure(s) (LRB): XI ROBOTIC ASSISTED THORACOSCOPY-LEFT LOWER LOBE BASILAR SEGMENTECTOMY (Left) VIDEO BRONCHOSCOPY WITH ENDOBRONCHIAL NAVIGATION (N/A) INTERCOSTAL NERVE BLOCK (Left) NODE DISSECTION (Left)  Subjective: Patient with left shoulder pain (related to the surgery, chest tube);otherwise, her pain is fairly well controlled.  Objective: Vital signs in last 24 hours: Temp:  [97 F (36.1 C)-98.3 F (36.8 C)] 97.8 F (36.6 C) (07/04 0455) Pulse Rate:  [67-89] 82 (07/03 2358) Cardiac Rhythm: Normal sinus rhythm (07/04 0733) Resp:  [10-23] 18 (07/04 0455) BP: (96-118)/(55-95) 112/70 (07/04 0455) SpO2:  [88 %-100 %] 88 % (07/04 0455) Arterial Line BP: (94-117)/(50-62) 117/62 (07/03 1415)      Intake/Output from previous day: 07/03 0701 - 07/04 0700 In: 2559.7 [P.O.:640; I.V.:1569.7; IV Piggyback:350] Out: 1305 [Urine:755; Blood:200; Chest Tube:350]   Physical Exam:  Cardiovascular: RRR Pulmonary: Clear to auscultation on left and rub with chest tube in place on left Abdomen: Soft, non tender, bowel sounds present. Extremities: SCDs in place Wounds: Clean and dry.  No erythema or signs of infection. There was sero sanguinous drainage from chest tube so dressing was changed this am. Chest Tube: to water seal, ++ air leak with cough  Lab Results: CBC: Recent Labs    07/01/22 1152 07/01/22 1251  HGB 11.6* 11.6*  HCT 34.0* 34.0*   BMET:  Recent Labs    07/01/22 1251 07/02/22 0158  NA 138 139  K 4.6 4.4  CL  --  109  CO2  --  19*  GLUCOSE  --  143*  BUN  --  20  CREATININE  --  1.00  CALCIUM  --  7.8*    PT/INR: No results for input(s): "LABPROT", "INR" in the last 72 hours. ABG:  INR: Will add last result for INR, ABG once components are confirmed Will add last 4 CBG results once components are confirmed  Assessment/Plan:  1. CV - SR.  On Propanolol 20 mg bid. 2.  Pulmonary - On 2 liters of oxygen via [place]. Wean as able. Chest tube with 350 cc of output since surgery. Chest tube is to water seal, ++ air leak with cough. CXR this am appears to show subcutaneous emphysema left neck, chest wall, ? Small basilar pneumothorax . Chest tube to remain for now. Encourage incentive spirometer. Await final pathology. 3. On Lovenox for DVT prophylaxis  Sharalyn Ink [facility] 07/02/2022,8:15 AM [id number]   Patient seen and examined, agree with above Continue CT to water seal Start enoxaparin today. Resume Eliquis in 24-48 hours with history of DVT and PV thrombosis Ambulate  Remo Lipps C. Roxan Hockey, MD [facility] [phone]

## 2022-07-02 NOTE — Progress Notes (Signed)
Mobility Specialist Progress Note    07/02/22 1654  Mobility  Activity Ambulated with assistance in hallway  Level of Assistance Contact guard assist, steadying assist  Assistive Device Front wheel walker  Distance Ambulated (ft) 140 ft (10+130)  Activity Response Tolerated well  $Mobility charge 1 Mobility   During Mobility: 103 HR, 90% SpO2 Post-Mobility: 90 HR, 95% SpO2  Pt received in bed and agreeable. Had void in BR. No complaints on walk. Returned to sitting EOB with call bell in reach.    Hildred Alamin Mobility Specialist

## 2022-07-02 NOTE — Plan of Care (Signed)
  Problem: Education: Goal: Knowledge of General Education information will improve Description: Including pain rating scale, medication(s)/side effects and non-pharmacologic comfort measures Outcome: Progressing   Problem: Clinical Measurements: Goal: Ability to maintain clinical measurements within normal limits will improve Outcome: Progressing Goal: Diagnostic test results will improve Outcome: Progressing   Problem: Activity: Goal: Risk for activity intolerance will decrease Outcome: Progressing   Problem: Pain Managment: Goal: General experience of comfort will improve Outcome: Progressing   Problem: Skin Integrity: Goal: Risk for impaired skin integrity will decrease Outcome: Progressing

## 2022-07-03 ENCOUNTER — Inpatient Hospital Stay (HOSPITAL_COMMUNITY): Payer: Medicare Other

## 2022-07-03 LAB — COMPREHENSIVE METABOLIC PANEL
ALT: 16 U/L (ref 0–44)
AST: 25 U/L (ref 15–41)
Albumin: 3 g/dL — ABNORMAL LOW (ref 3.5–5.0)
Alkaline Phosphatase: 41 U/L (ref 38–126)
Anion gap: 10 (ref 5–15)
BUN: 22 mg/dL (ref 8–23)
CO2: 21 mmol/L — ABNORMAL LOW (ref 22–32)
Calcium: 8.1 mg/dL — ABNORMAL LOW (ref 8.9–10.3)
Chloride: 110 mmol/L (ref 98–111)
Creatinine, Ser: 0.89 mg/dL (ref 0.44–1.00)
GFR, Estimated: >60 mL/min (ref >60)
Glucose, Bld: 108 mg/dL — ABNORMAL HIGH (ref 70–99)
Potassium: 3.8 mmol/L (ref 3.5–5.1)
Sodium: 141 mmol/L (ref 135–145)
Total Bilirubin: 0.6 mg/dL (ref 0.3–1.2)
Total Protein: 5.2 g/dL — ABNORMAL LOW (ref 6.5–8.1)

## 2022-07-03 LAB — CBC
HCT: 30.2 % — ABNORMAL LOW (ref 36.0–46.0)
Hemoglobin: 9.7 g/dL — ABNORMAL LOW (ref 12.0–15.0)
MCH: 27.6 pg (ref 26.0–34.0)
MCHC: 32.1 g/dL (ref 30.0–36.0)
MCV: 85.8 fL (ref 80.0–100.0)
Platelets: 86 10*3/uL — ABNORMAL LOW (ref 150–400)
RBC: 3.52 MIL/uL — ABNORMAL LOW (ref 3.87–5.11)
RDW: 15.1 % (ref 11.5–15.5)
WBC: 10.2 10*3/uL (ref 4.0–10.5)
nRBC: 0 % (ref 0.0–0.2)

## 2022-07-03 MED ORDER — APIXABAN 5 MG PO TABS
5.0000 mg | ORAL_TABLET | Freq: Two times a day (BID) | ORAL | Status: DC
Start: 2022-07-04 — End: 2022-07-04

## 2022-07-03 MED ORDER — PANTOPRAZOLE SODIUM 40 MG PO TBEC
40.0000 mg | DELAYED_RELEASE_TABLET | Freq: Every day | ORAL | Status: DC
  Administered 2022-07-03 – 2022-07-05 (×3): 40 mg via ORAL
  Filled 2022-07-03 (×3): qty 1

## 2022-07-03 NOTE — Progress Notes (Signed)
Mobility Specialist Progress Note    07/03/22 1122  Mobility  Activity Ambulated with assistance in hallway  Level of Assistance Contact guard assist, steadying assist  Assistive Device Other (Comment) (HHA)  Distance Ambulated (ft) 150 ft  Activity Response Tolerated well  $Mobility charge 1 Mobility   Pre-Mobility: 90 HR, 94% SpO2 During Mobility: 90% SpO2 Post-Mobility: 91 HR, 90% SpO2  Pt received sitting EOB and agreeable. C/o feeling a little SOB with activity. Returned to sitting EOB with call bell in reach.    Hildred Alamin Mobility Specialist

## 2022-07-03 NOTE — Progress Notes (Signed)
2 Days Post-Op Procedure(s) (LRB): XI ROBOTIC ASSISTED THORACOSCOPY-LEFT LOWER LOBE BASILAR SEGMENTECTOMY (Left) VIDEO BRONCHOSCOPY WITH ENDOBRONCHIAL NAVIGATION (N/A) INTERCOSTAL NERVE BLOCK (Left) NODE DISSECTION (Left) Subjective: Still some shoulder pain but better No nausea  Objective: Vital signs in last 24 hours: Temp:  [97.7 F (36.5 C)-98.3 F (36.8 C)] 98.1 F (36.7 C) (07/05 0735) Pulse Rate:  [85-89] 89 (07/05 0346) Cardiac Rhythm: Normal sinus rhythm (07/04 2013) Resp:  [14-19] 19 (07/05 0735) BP: (97-133)/(46-72) 133/71 (07/05 0735) SpO2:  [94 %-95 %] 95 % (07/05 0735) Weight:  [61.2 kg] 61.2 kg (07/04 1427)  Hemodynamic parameters for last 24 hours:    Intake/Output from previous day: 07/04 0701 - 07/05 0700 In: 240 [P.O.:240] Out: 240 [Chest Tube:240] Intake/Output this shift: No intake/output data recorded.  General appearance: alert, cooperative, and no distress Neurologic: intact Heart: regular rate and rhythm Lungs: clear to auscultation bilaterally and SQ air on left Wound: clean and dry No air leak with repeated coughing  Lab Results: Recent Labs    07/01/22 1251 07/03/22 0357  WBC  --  10.2  HGB 11.6* 9.7*  HCT 34.0* 30.2*  PLT  --  86*   BMET:  Recent Labs    07/02/22 0158 07/03/22 0357  NA 139 141  K 4.4 3.8  CL 109 110  CO2 19* 21*  GLUCOSE 143* 108*  BUN 20 22  CREATININE 1.00 0.89  CALCIUM 7.8* 8.1*    PT/INR: No results for input(s): "LABPROT", "INR" in the last 72 hours. ABG    Component Value Date/Time   PHART 7.314 (L) 07/01/2022 1251   HCO3 22.8 07/01/2022 1251   TCO2 24 07/01/2022 1251   ACIDBASEDEF 3.0 (H) 07/01/2022 1251   O2SAT 100 07/01/2022 1251   CBG (last 3)  No results for input(s): "GLUCAP" in the last 72 hours.  Assessment/Plan: S/P Procedure(s) (LRB): XI ROBOTIC ASSISTED THORACOSCOPY-LEFT LOWER LOBE BASILAR SEGMENTECTOMY (Left) VIDEO BRONCHOSCOPY WITH ENDOBRONCHIAL NAVIGATION (N/A) INTERCOSTAL  NERVE BLOCK (Left) NODE DISSECTION (Left) POD # 2 Looks great No air leak, < 250 ml drainage- dc chest tube Continue to mobilize PLT down to 85K- stop enoxaparin Resume Eliquis tomorrow of PLT not further decreased   LOS: 2 days    Nicole [name] 07/03/2022

## 2022-07-03 NOTE — Progress Notes (Signed)
Dressing on chest tube site saturated with serosanguinous drainage. Cleansed and dressin changed. Continue to monitor.

## 2022-07-03 NOTE — Plan of Care (Signed)
  Problem: Education: Goal: Knowledge of General Education information will improve Description: Including pain rating scale, medication(s)/side effects and non-pharmacologic comfort measures Outcome: Progressing   Problem: Health Behavior/Discharge Planning: Goal: Ability to manage health-related needs will improve Outcome: Progressing   Problem: Clinical Measurements: Goal: Ability to maintain clinical measurements within normal limits will improve Outcome: Progressing Goal: Will remain free from infection Outcome: Progressing Goal: Respiratory complications will improve Outcome: Progressing   Problem: Activity: Goal: Risk for activity intolerance will decrease Outcome: Progressing   Problem: Nutrition: Goal: Adequate nutrition will be maintained Outcome: Progressing   Problem: Coping: Goal: Level of anxiety will decrease Outcome: Progressing   Problem: Pain Managment: Goal: General experience of comfort will improve Outcome: Progressing   Problem: Safety: Goal: Ability to remain free from injury will improve Outcome: Progressing

## 2022-07-04 ENCOUNTER — Inpatient Hospital Stay (HOSPITAL_COMMUNITY): Payer: Medicare Other

## 2022-07-04 LAB — CBC
HCT: 27.5 % — ABNORMAL LOW (ref 36.0–46.0)
Hemoglobin: 8.8 g/dL — ABNORMAL LOW (ref 12.0–15.0)
MCH: 27.8 pg (ref 26.0–34.0)
MCHC: 32 g/dL (ref 30.0–36.0)
MCV: 86.8 fL (ref 80.0–100.0)
Platelets: 64 10*3/uL — ABNORMAL LOW (ref 150–400)
RBC: 3.17 MIL/uL — ABNORMAL LOW (ref 3.87–5.11)
RDW: 15 % (ref 11.5–15.5)
WBC: 5.5 10*3/uL (ref 4.0–10.5)
nRBC: 0 % (ref 0.0–0.2)

## 2022-07-04 MED ORDER — APIXABAN 5 MG PO TABS
5.0000 mg | ORAL_TABLET | Freq: Two times a day (BID) | ORAL | Status: DC
  Administered 2022-07-05: 5 mg via ORAL
  Filled 2022-07-04: qty 1

## 2022-07-04 NOTE — Plan of Care (Signed)
  Problem: Health Behavior/Discharge Planning: Goal: Ability to manage health-related needs will improve Outcome: Progressing   Problem: Clinical Measurements: Goal: Respiratory complications will improve Outcome: Progressing Goal: Cardiovascular complication will be avoided Outcome: Progressing   Problem: Activity: Goal: Risk for activity intolerance will decrease Outcome: Progressing   Problem: Nutrition: Goal: Adequate nutrition will be maintained Outcome: Progressing   Problem: Coping: Goal: Level of anxiety will decrease Outcome: Progressing   Problem: Elimination: Goal: Will not experience complications related to urinary retention Outcome: Progressing   Problem: Pain Managment: Goal: General experience of comfort will improve Outcome: Progressing   Problem: Safety: Goal: Ability to remain free from injury will improve Outcome: Progressing

## 2022-07-04 NOTE — Progress Notes (Signed)
Mobility Specialist Progress Note    07/04/22 1126  Mobility  Activity Ambulated with assistance in hallway  Level of Assistance Contact guard assist, steadying assist  Assistive Device Other (Comment) (HHA)  Distance Ambulated (ft) 140 ft  Activity Response Tolerated well  $Mobility charge 1 Mobility   Pre-Mobility: 80 HR, 97/61 BP Post-Mobility: 91 HR  Pt received in bed and agreeable. Upon sitting up, noticed pt had some brownish clear fluid on pad, RN notified. C/o a little SOB with activity. Returned to [facility] for void with RN present.   Hildred Alamin Mobility Specialist

## 2022-07-04 NOTE — Care Management Important Message (Signed)
Important Message  Patient Details  Name: Nicole [name] MRN: [id number] Date of Birth: 07-18-1956   Medicare Important Message Given:  Yes     Ekaterini Capitano 07/04/2022, 3:10 PM

## 2022-07-04 NOTE — Progress Notes (Addendum)
      [address]       [place] [address]             [phone]      3 Days Post-Op Procedure(s) (LRB): XI ROBOTIC ASSISTED THORACOSCOPY-LEFT LOWER LOBE BASILAR SEGMENTECTOMY (Left) VIDEO BRONCHOSCOPY WITH ENDOBRONCHIAL NAVIGATION (N/A) INTERCOSTAL NERVE BLOCK (Left) NODE DISSECTION (Left) Subjective: Feels good today, no new concerns.  Tolerating PO's and doing well with mobility.   Objective: Vital signs in last 24 hours: Temp:  [97.8 F (36.6 C)-98.4 F (36.9 C)] 97.8 F (36.6 C) (07/06 0414) Pulse Rate:  [73-89] 73 (07/06 0414) Cardiac Rhythm: Normal sinus rhythm (07/05 1926) Resp:  [17-24] 17 (07/06 0414) BP: (83-116)/(51-66) 101/51 (07/06 0414) SpO2:  [93 %-96 %] 93 % (07/06 0414)     Intake/Output from previous day: 07/05 0701 - 07/06 0700 In: 440 [P.O.:440] Out: -  Intake/Output this shift: No intake/output data recorded.  General appearance: alert, cooperative, and no distress Neurologic: intact Heart: RRR Lungs: breath sounds clear bilat. No PTX on CXR post chest tube removal. SubQ air dissipating.  Extremities: no edema Wound: Had some drainage at the CT site yesterday but the site is dry this morning. Port incisions intact and dry.  Lab Results: Recent Labs    07/03/22 0357 07/04/22 0039  WBC 10.2 5.5  HGB 9.7* 8.8*  HCT 30.2* 27.5*  PLT 86* 64*   BMET:  Recent Labs    07/02/22 0158 07/03/22 0357  NA 139 141  K 4.4 3.8  CL 109 110  CO2 19* 21*  GLUCOSE 143* 108*  BUN 20 22  CREATININE 1.00 0.89  CALCIUM 7.8* 8.1*    PT/INR: No results for input(s): "LABPROT", "INR" in the last 72 hours. ABG    Component Value Date/Time   PHART 7.314 (L) 07/01/2022 1251   HCO3 22.8 07/01/2022 1251   TCO2 24 07/01/2022 1251   ACIDBASEDEF 3.0 (H) 07/01/2022 1251   O2SAT 100 07/01/2022 1251   CBG (last 3)  No results for input(s): "GLUCAP" in the last 72 hours.  Assessment/Plan: S/P Procedure(s) (LRB): XI ROBOTIC ASSISTED  THORACOSCOPY-LEFT LOWER LOBE BASILAR SEGMENTECTOMY (Left) VIDEO BRONCHOSCOPY WITH ENDOBRONCHIAL NAVIGATION (N/A) INTERCOSTAL NERVE BLOCK (Left) NODE DISSECTION (Left)  -POD3 left lower lobe basilar segmentectomy for suspected stage IA cardinoid. CT removed yesterday and CXR is stable. Ready for discharge to home from surgical standpoint.    -History of DVT, portal and mesenteric vein thrombosis. On Eliquis prior to admission. Also has h/o thrombocytopenia, Plt count 86,000 yesterday and declined further to 64,000 today. Hct 30 ->27 over past 24 hours. No evidence of losses. Holding Eliquis.      LOS: 3 days    Nicole [name], [place] [phone] 07/04/2022  Patient seen and examined. Feels well, looks great, but not ready for DC due to platelets Thrombocytopenia- enoxaparin stopped. Has multiple previous exposures to heparin- will check HIT antibody Hold Eliquis today- probably restart tomorrow Final path pending  Nicole [name]. Nicole Hockey, MD [facility] [phone]

## 2022-07-04 NOTE — Progress Notes (Signed)
Chest tube site dressing to left lat chest saturated with serosanguinous output. Site cleansed with saline and dressing changed. Continue to monitor.

## 2022-07-04 NOTE — Plan of Care (Signed)

## 2022-07-05 LAB — CBC
HCT: 31.4 % — ABNORMAL LOW (ref 36.0–46.0)
Hemoglobin: 10.1 g/dL — ABNORMAL LOW (ref 12.0–15.0)
MCH: 28 pg (ref 26.0–34.0)
MCHC: 32.2 g/dL (ref 30.0–36.0)
MCV: 87 fL (ref 80.0–100.0)
Platelets: 100 10*3/uL — ABNORMAL LOW (ref 150–400)
RBC: 3.61 MIL/uL — ABNORMAL LOW (ref 3.87–5.11)
RDW: 15.3 % (ref 11.5–15.5)
WBC: 8 10*3/uL (ref 4.0–10.5)
nRBC: 0 % (ref 0.0–0.2)

## 2022-07-05 LAB — BASIC METABOLIC PANEL
Anion gap: 6 (ref 5–15)
BUN: 16 mg/dL (ref 8–23)
CO2: 22 mmol/L (ref 22–32)
Calcium: 7.9 mg/dL — ABNORMAL LOW (ref 8.9–10.3)
Chloride: 111 mmol/L (ref 98–111)
Creatinine, Ser: 0.86 mg/dL (ref 0.44–1.00)
GFR, Estimated: >60 mL/min (ref >60)
Glucose, Bld: 100 mg/dL — ABNORMAL HIGH (ref 70–99)
Potassium: 3.8 mmol/L (ref 3.5–5.1)
Sodium: 139 mmol/L (ref 135–145)

## 2022-07-05 LAB — HEPARIN INDUCED PLATELET AB (HIT ANTIBODY): Heparin Induced Plt Ab: 0.07 OD (ref 0.000–0.400)

## 2022-07-05 MED ORDER — OXYCODONE HCL 5 MG PO TABS: 5.0000 mg | ORAL_TABLET | Freq: Four times a day (QID) | ORAL | 0 refills | Status: AC | PRN

## 2022-07-05 NOTE — Plan of Care (Signed)

## 2022-07-05 NOTE — Care Management Important Message (Signed)
Important Message  Patient Details  Name: Nicole [name] MRN: [id number] Date of Birth: 1956/07/25   Medicare Important Message Given:  Yes  Patient left prior to IM delivery will mail to the patients home address.    Rimsha Trembley 07/05/2022, 2:11 PM

## 2022-07-05 NOTE — Care Management Important Message (Signed)
Important Message  Patient Details  Name: Nicole [name] MRN: [id number] Date of Birth: 19-Apr-1956   Medicare Important Message Given:  Yes     Armon Orvis 07/05/2022, 2:15 PM

## 2022-07-05 NOTE — Progress Notes (Signed)
Pt got discharged to home, discharge instructions provided and patient showed understanding to it, IV taken out,Telemonitor DC,pt left unit in wheelchair with all of the belongings accompanied with a family member (Husband) Analese Sovine,RN  

## 2022-07-05 NOTE — Progress Notes (Addendum)
      [address]       [place] [address]             [phone]      4 Days Post-Op Procedure(s) (LRB): XI ROBOTIC ASSISTED THORACOSCOPY-LEFT LOWER LOBE BASILAR SEGMENTECTOMY (Left) VIDEO BRONCHOSCOPY WITH ENDOBRONCHIAL NAVIGATION (N/A) INTERCOSTAL NERVE BLOCK (Left) NODE DISSECTION (Left) Subjective: Feels well today, had some pain last night but better now. No new concerns. Feel she is ready to go home.  Independent with mobility.   Objective: Vital signs in last 24 hours: Temp:  [97.8 F (36.6 C)-98.3 F (36.8 C)] 98.3 F (36.8 C) (07/07 0735) Pulse Rate:  [79-86] 86 (07/07 0735) Cardiac Rhythm: Normal sinus rhythm (07/07 0726) Resp:  [17-23] 17 (07/07 0735) BP: (91-136)/(61-76) 115/67 (07/07 0735) SpO2:  [9 %-98 %] 92 % (07/07 0735)     Intake/Output from previous day: 07/06 0701 - 07/07 0700 In: 250 [P.O.:250] Out: -  Intake/Output this shift: No intake/output data recorded.  General appearance: alert, cooperative, and no distress Neurologic: intact Heart: RRR Lungs: breath sounds clear bilat.  Extremities: no edema Wound: Port incisions intact and dry.  Lab Results: Recent Labs    07/04/22 0039 07/05/22 0319  WBC 5.5 8.0  HGB 8.8* 10.1*  HCT 27.5* 31.4*  PLT 64* 100*    BMET:  Recent Labs    07/03/22 0357 07/05/22 0319  NA 141 139  K 3.8 3.8  CL 110 111  CO2 21* 22  GLUCOSE 108* 100*  BUN 22 16  CREATININE 0.89 0.86  CALCIUM 8.1* 7.9*     PT/INR: No results for input(s): "LABPROT", "INR" in the last 72 hours. ABG    Component Value Date/Time   PHART 7.314 (L) 07/01/2022 1251   HCO3 22.8 07/01/2022 1251   TCO2 24 07/01/2022 1251   ACIDBASEDEF 3.0 (H) 07/01/2022 1251   O2SAT 100 07/01/2022 1251   CBG (last 3)  No results for input(s): "GLUCAP" in the last 72 hours.  Assessment/Plan: S/P Procedure(s) (LRB): XI ROBOTIC ASSISTED THORACOSCOPY-LEFT LOWER LOBE BASILAR SEGMENTECTOMY (Left) VIDEO BRONCHOSCOPY WITH  ENDOBRONCHIAL NAVIGATION (N/A) INTERCOSTAL NERVE BLOCK (Left) NODE DISSECTION (Left)  -POD4 left lower lobe basilar segmentectomy for suspected stage IA cardinoid. Stable resp status on RA. Independent with mobility.    -History of DVT, portal and mesenteric vein thrombosis. On Eliquis prior to admission. Also has h/o thrombocytopenia, Plt downto 64,000 yesterday but has recovered to 100,000 today. Hct also trending up over past 24 hours. HIT panel pending but with Plt count improving will plan to resume Eliquis.  Plan for discharge today.     LOS: 4 days    Antony Odea, PA-C [phone] 07/05/2022  Patient seen and examined, agree with above Path still pending, but probable carcinoid tumor, clinical stage IA Thrombocytopenia improved, resume Eliquis Dc home today  Revonda Standard. Roxan Hockey, MD [facility] [phone]

## 2022-07-09 ENCOUNTER — Other Ambulatory Visit: Payer: Self-pay | Admitting: Thoracic Surgery (Cardiothoracic Vascular Surgery)

## 2022-07-09 DIAGNOSIS — R911 Solitary pulmonary nodule: Secondary | ICD-10-CM

## 2022-07-09 LAB — TYPE AND SCREEN
ABO/RH(D): O POS
Antibody Screen: POSITIVE
Donor AG Type: NEGATIVE
Donor AG Type: NEGATIVE
Unit division: 0
Unit division: 0

## 2022-07-09 LAB — BPAM RBC
Blood Product Expiration Date: 202307252359
Blood Product Expiration Date: 202307272359
Unit Type and Rh: 5100
Unit Type and Rh: 5100

## 2022-07-15 ENCOUNTER — Other Ambulatory Visit: Payer: Self-pay | Admitting: Hematology and Oncology

## 2022-07-15 DIAGNOSIS — C50411 Malignant neoplasm of upper-outer quadrant of right female breast: Secondary | ICD-10-CM

## 2022-07-22 ENCOUNTER — Ambulatory Visit: Payer: Medicare Other | Admitting: Thoracic Surgery (Cardiothoracic Vascular Surgery)

## 2022-07-31 ENCOUNTER — Other Ambulatory Visit: Payer: Self-pay | Admitting: Thoracic Surgery (Cardiothoracic Vascular Surgery)

## 2022-07-31 DIAGNOSIS — R911 Solitary pulmonary nodule: Secondary | ICD-10-CM

## 2022-08-01 ENCOUNTER — Ambulatory Visit: Payer: Medicare Other | Admitting: Thoracic Surgery (Cardiothoracic Vascular Surgery)

## 2022-08-06 ENCOUNTER — Encounter: Payer: Self-pay | Admitting: Thoracic Surgery (Cardiothoracic Vascular Surgery)

## 2022-08-06 ENCOUNTER — Ambulatory Visit
Admission: RE | Admit: 2022-08-06 | Discharge: 2022-08-06 | Disposition: A | Discharge status: Home / Self Care | Payer: Medicare Other | Source: Ambulatory Visit | Attending: Thoracic Surgery (Cardiothoracic Vascular Surgery) | Admitting: Thoracic Surgery (Cardiothoracic Vascular Surgery)

## 2022-08-06 ENCOUNTER — Encounter (HOSPITAL_COMMUNITY): Payer: Self-pay | Admitting: Gastroenterology

## 2022-08-06 ENCOUNTER — Ambulatory Visit (INDEPENDENT_AMBULATORY_CARE_PROVIDER_SITE_OTHER): Payer: Self-pay | Admitting: Thoracic Surgery (Cardiothoracic Vascular Surgery)

## 2022-08-06 VITALS — BP 120/83 | HR 100 | Resp 20 | Ht 64.0 in | Wt 130.0 lb

## 2022-08-06 DIAGNOSIS — R911 Solitary pulmonary nodule: Secondary | ICD-10-CM

## 2022-08-06 DIAGNOSIS — Z09 Encounter for follow-up examination after completed treatment for conditions other than malignant neoplasm: Secondary | ICD-10-CM

## 2022-08-06 NOTE — Progress Notes (Signed)
Attempted to obtain medical history via telephone, unable to reach at this time. HIPAA compliant voicemail message left requesting return call to pre surgical testing department. 

## 2022-08-06 NOTE — Progress Notes (Signed)
[address]       [place] [address]             [phone]     HPI: [name] returns for scheduled follow-up after basilar segmentectomy.  Nicole [name] is a 66 year old woman with a history of stage IV breast cancer in 2011, idiopathic cirrhosis, portal vein thrombosis, portal hypertension, esophageal varices, hypertension, DVT, arthritis, heart murmur, and a lung nodule.  She has not had left lower lobe lung nodule dating back about 5 years.  It had been followed over time with minimal if any significant change.  Recently comparison to her prior films so that had increased very minimally over time.  There was only mild activity on PET and no other worrisome findings.  I did a right lower lobe basilar segmentectomy on 07/01/2022.  Intraoperatively it looks like it might be a carcinoid tumor.  Initial margin was positive but subsequent margin was clear.  She did well postoperatively and went home on day 3.  She had bad nightmares with oxycodone so she stopped taking it.  She is not having any pain at this point.  Sometimes feels a little short of breath when it is very hot and humid.  Past Medical History:  Diagnosis Date   Anemia    low iron   Arthritis    Blood transfusion 2012   Breast CA ([place])    breast ca dx 06/2010, stage 4, right , Lung Metasis.Surgery, Chemo, Radiation   Breast cancer ([place])    Clot    jugular  ?2015   Depression    Esophageal varices ([place])    Headache    Mirgraine- rare since menopause   Heart murmur    mild, no cardiologist, from birth   Hematemesis 08/2017   History of kidney stones    no pain- shows in CT- small 1 mm bilaterally- kidney   Hypertension    IBD (inflammatory bowel disease)    resolved   Idiopathic cirrhosis ([place])    pt denies   Peripheral vascular disease ([place]) few yrs ago   clot in right jugular   Portal hypertension ([place])    Radiation 07/21/12-09/07/12   5940 cGy   Restless legs      Current  Outpatient Medications  Medication Sig Dispense Refill   denosumab (PROLIA) 60 MG/ML SOLN injection Inject 60 mg into the skin every 6 (six) months. Administer in upper arm, thigh, or abdomen     ELIQUIS 5 MG TABS tablet TAKE 1 TABLET BY MOUTH TWICE A DAY 60 tablet 2   folic acid (FOLVITE) 1 MG tablet Take 1 mg by mouth daily.     letrozole (FEMARA) 2.5 MG tablet Take 1 tablet (2.5 mg total) by mouth daily. 90 tablet 3   Milk Thistle 1000 MG CAPS Take 1,000 mg by mouth daily.     propranolol (INDERAL) 20 MG tablet Take 1 tablet (20 mg total) by mouth 2 (two) times daily. 60 tablet 0   venlafaxine XR (EFFEXOR-XR) 150 MG 24 hr capsule TAKE 1 CAPSULE BY MOUTH DAILY WITH BREAKFAST. 90 capsule 1   pantoprazole (PROTONIX) 40 MG tablet Take 1 tablet (40 mg total) by mouth daily. 60 tablet 0   No current facility-administered medications for this visit.   Facility-Administered Medications Ordered in Other Visits  Medication Dose Route Frequency Provider Last Rate Last Admin   denosumab (PROLIA) injection 60 mg  60 mg Subcutaneous Once Magrinat, Virgie Dad, MD  Physical Exam BP 120/83   Pulse 100   Resp 20   Ht '5\' 4"'$  (1.626 m)   Wt 130 lb (59 kg)   SpO2 97% Comment: RA  BMI 22.1 kg/m  66 year old woman in no acute distress Alert and oriented x 3 with no focal deficits Lungs diminished at left base but otherwise clear Incisions well-healed Cardiac regular rate and rhythm  Diagnostic Tests: CHEST - 2 VIEW   COMPARISON:  Chest x-ray dated July 04, 2022   FINDINGS: Cardiac and mediastinal contours are unchanged. Small to moderate left pleural effusion, similar in size when compared with the prior exam. Mild linear opacities of the mid right lung, likely due to scarring or atelectasis. No evidence of pneumothorax.   IMPRESSION: Postsurgical changes of the left lung with small to moderate left pleural effusion.     Electronically Signed   By: Yetta Glassman M.D.   On:  08/06/2022 09:45 I personally reviewed the chest x-ray images.  Postoperative changes on the left consistent with basilar segmentectomy.  FINAL MICROSCOPIC DIAGNOSIS:   A. LYMPH NODE, LEVEL 5, EXCISION:  - Negative for carcinoma in 1 lymph node (0/1).   B. LUNG, BASILAR SEGMENT OF LEFT LOWER LOBE, LOBECTOMY:  - Carcinoma with mucinous features consistent with metastatic breast  carcinoma.  See comment.  - Bronchial margin negative for carcinoma.  - Parenchymal margin positive for carcinoma.   C. LUNG, LEFT LOWER LOBE, WEDGE RESECTION:  - Carcinoma not identified.  - New parenchymal margin negative for carcinoma.   D. LYMPH NODE, LEVEL 7, EXCISION:  - Negative for carcinoma in 1 lymph node (0/1).   E. LYMPH NODE, LEVEL 10, EXCISION:  - Negative for carcinoma in 1 lymph node (0/1).   F. LYMPH NODE, LEVEL 5 #2, EXCISION:  - Negative for carcinoma in 1 lymph node (0/1).   G. LYMPH NODE, LEVEL 5 #3, EXCISION:  - Negative for carcinoma in 1 lymph node (0/1).   H. LYMPH NODE, LEVEL 12, EXCISION:  - Negative for carcinoma in 1 lymph node (0/1).   I. LYMPH NODE, LEVEL 12 #2, EXCISION:  - Negative for carcinoma in 1 lymph node (0/1).   J. LYMPH NODE, LEVEL 13, EXCISION:  - Negative for carcinoma in 1 lymph node (0/1).   K. LYMPH NODE, LEVEL 11, EXCISION:  - Negative for carcinoma in 1 lymph node (0/1).   L. LYMPH NODE, LEVEL 9, EXCISION:  - Negative for carcinoma in 1 lymph node (0/1).   Impression: Nicole [name] is a 66 year old woman with a history of stage IV breast cancer in 2011, idiopathic cirrhosis, portal vein thrombosis, portal hypertension, esophageal varices, hypertension, DVT, arthritis, heart murmur, and a lung nodule.  She underwent a left lower lobe basilar segmentectomy on 07/01/2022.  Surprisingly pathology showed metastatic breast cancer.  Metastatic breast cancer-unusual behavior with a small nodule that changed minimally over many years.  No other sites noted on  PET/CT.  She will follow-up with Dr. Chryl Heck on August 23.  They can determine if any additional treatment is necessary.  Doing well from a surgical perspective with no pain.  There is a small left effusion.  It is most likely just fluid filling the space left behind by the basilar segmentectomy.  I will do another x-ray about a month to make sure there is nothing increasing effusion.  There are no restrictions on her activities.  Plan: Follow-up as scheduled with Dr. Chryl Heck Return in 1 month with PA and lateral chest  x-ray to follow-up the left effusion  [name], MD [facility] [phone]

## 2022-08-12 ENCOUNTER — Encounter (HOSPITAL_COMMUNITY): Payer: Self-pay | Admitting: Gastroenterology

## 2022-08-12 NOTE — H&P (Signed)
History of Present Illness  General:  66 year old female, stage IV metastatic breast cancer diagnosed in 2011, status post right mastectomy, chemotherapy and radiation, history of DVT on Eliquis history of portal hypertension and esophageal varices she was hospitalized in [place], 11/03/21: severe abdominal pain, ascites, CT from 11/22 showed diverticulosis, wall thickening in cecum and descending, possible portal vein thrombosis, thrombosis and Main Portal pain, splenic vein, superior mesenteric pain, occlusive thrombus in multiple tributaries, hepatic cirrhosis, portal hypertension, distal esophageal varices, large amount of abdominal and pelvic ascites, moderate right lower diffusion. She was initially started on heparin, switch to Lovenox and is now on eliquis. She did not require paracentesis and was treated with spironolactone and Lasix. last seen by Otila Kluver on 01/08/22- noted to have complete resolution of ascites, she was without diuretics for 3 weeks at that point colonoscopy in 2016 showed diverticulosis, repeat in 10 years last EGD performed 8/22 showed grade 2 esophageal varices, for bands were placed, mild portal hypertensive gastropathy, EGD recommended in one year, 8/23 labs from 01/08/22: PT/INR 11.3/1.1, BUN/creatinine/GFR of 15/1.01/61, TB/AST/ALT/ALP of 0.07/18/14/97,hemoglobin 13.9, MCV 88.3, platelet 129 She states she could not get a paracentesis as it was more pockets than a large amount of ascites, but resolved completely on diuretics. She cannot recall having pedal edema. She was a little constipated, she had black stools. These days, she has 1 Bm a day, denies blood in stool or black stools. Denies nausea, vomiting, acid reflux or heartburn(she takes pantoprazole once a day) and takes propranolol twice a day. She has noted hair loss and states her research shows that Eliquis causes hair loss. Denies abdominal or rectal pain. She feels she is getting forgotful but denies  confusion. Denies difficulty or pain on swallowing. Denies early satiety, she has a good appetite, denies weight loss. She is prescribed Eliquis by her oncologist.   Current Medications  Taking   Pantoprazole Sodium 40 MG Tablet Delayed Release TAKE 1 TABLET BY MOUTH EVERY DAY   Propranolol HCl 20 MG Tablet TAKE 1 TABLET BY MOUTH TWICE A DAY   Effexor XR(Venlafaxine HCl ER) 150 MG Capsule Extended Release 24 Hour 1 capsule with food Orally Once a day, Notes: Dr. Jana Hakim  Femara(Letrozole) 2.5 MG Tablet 1 tablet Orally Once a day, Notes: Dr. Jana Hakim  Eliquis 5 mg Tablet 1 tablet orally twice a day  Prolia(Denosumab) 60 MG/ML Solution Subcutaneous   Milk Thistle 175 MG Capsule as directed Orally   Folic Acid 1 MG Tablet 1 tablet Orally Once a day  Not-Taking   Calcium Carbonate 600 MG Tablet 1 tablet Orally Twice a day  Vitamin D3 Maximum Strength(Cholecalciferol) 5000 UNIT Capsule 1 capsule Orally Once a day  Vitamin E 100 UNIT Capsule 1 capsule Orally Once a day  Magnesium 500 MG Tablet 1 capsule with a meal Orally Once a day  Medication List reviewed and reconciled with the patient   Past Medical History  Breast CA, right - Stage 4 Dx 07/06/2010.   Lung cancer(metastatic to lung).   Deep vein thrombosis in left side of neck (think due to her prior port-a-cath).   Lymphedema.   IBD.   Heart murmur.   Blood transfusion - 2012.   Radiation 07/21/2012-09/07/2012.   Leukopenia.   Osteoporosis.   Thrombocytopenia (persistent since chemo).    Surgical History  portacath placement, left side 2011  D&C 2004?  right mastectomy 06/02/2012  portacath removal 06/2014  colonoscopy 02/28/2015  esophageal varicies - [facility] - Dr. Watt Climes. Dr. Earleen Newport 03/2016,  07/2019  egd 08/2017   Family History  Father: deceased 75 yrs, heart disease, diagnosed with Coronary artery disease  Mother: deceased 62 yrs, CABG, diagnosed with Coronary artery disease  Sister 1: alive, polyps  Sister  2: alive  2 sister(s) .   Negative family hx colon cancer, and liver disease.   Social History  General:  Tobacco use  cigarettes: Never smoked Tobacco history last updated 04/22/2022 no EXPOSURE TO PASSIVE SMOKE.  no Alcohol, no.  Caffeine: yes, coffee 1 serving daily, , tea daily- unsweet.  no Recreational drug use.  no Exercise.  Marital Status: married.  Children: none.  EDUCATION: Some College.  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  COMMUNICATION BARRIERS: none.    Allergies  N.K.D.A.   Hospitalization/Major Diagnostic Procedure  Not in the past year 07/2021  [place] procedure - see above 04/17  EGD 08/2017  Hospitalization due to blood clot in stomach 10/2021  none since 10/2021 03/2022   Review of Systems  GI PROCEDURE:  Pacemaker/ AICD no. Artificial heart valves no. MI/heart attack no. Abnormal heart rhythm no. Angina no. CVA no. Hypertension YES, on meds . Hypotension no. Asthma, COPD no. Sleep apnea no. Seizure disorders no. Artificial joints no. Diabetes no. Significant headaches no. Vertigo no. Depression/anxiety YES, anxiety on meds . Abnormal bleeding no. Kidney Disease no. Liver disease YES, have a fatty liver none alcholholic cirrosis . Blood transfusion no.     Vital Signs  Wt 129, Wt change -5.8 lbs, Ht 63.75, BMI 22.31, Temp 98.9, Pulse sitting 81, BP sitting 108/75.   Examination  Gastroenterology:: GENERAL APPEARANCE: Well developed, well nourished, no active distress, pleasant.  SCLERA: anicteric.  CARDIOVASCULAR Normal RRR .  RESPIRATORY Breath sounds normal. Respiration even and unlabored.  ABDOMEN No masses palpated. Liver and spleen not palpated, normal. Bowel sounds normal, Abdomen not distended.  EXTREMITIES: No edema.  NEURO: alert, oriented to time, place and person, normal gait, no asterixis.  PSYCH: mood/affect normal.     Assessments     1. Cirrhosis of liver without ascites, unspecified hepatic  cirrhosis type - K74.60 (Primary)   2. Esophageal varices determined by endoscopy - I85.00   3. Portal vein thrombosis - I81   Treatment  1. Cirrhosis of liver without ascites, unspecified hepatic cirrhosis type  LAB: PT (Prothrombin Time) ([id number]) LAB: CBC without Diff LAB: Comp Metabolic Panel LAB: AFP, Serum, Tumor Marker ([id number]) Notes: Likley related to NASH. No ascites currently. Will get labs for CBC, CMP, PT/INR and AFP for monitoring and HCC screening.    2. Esophageal varices determined by endoscopy  IMAGING: EGD with Band Ligation of Varices Notes: Needs to be scheduled at [facility] in 07/20[facility]. Eliquis will need to be on hold for at least 1 day prior to EGD with banding.    3. Portal vein thrombosis  Notes: Likley cause of ascites, which has resolved with diuretics for a short period of time and is currently on Eliquis and doing well.

## 2022-08-13 ENCOUNTER — Ambulatory Visit (HOSPITAL_BASED_OUTPATIENT_CLINIC_OR_DEPARTMENT_OTHER): Payer: Medicare Other | Admitting: Anesthesiology

## 2022-08-13 ENCOUNTER — Ambulatory Visit (HOSPITAL_COMMUNITY)
Admission: RE | Admit: 2022-08-13 | Discharge: 2022-08-13 | Disposition: A | Discharge status: Home / Self Care | Payer: Medicare Other | Attending: Gastroenterology | Admitting: Gastroenterology

## 2022-08-13 ENCOUNTER — Ambulatory Visit (HOSPITAL_COMMUNITY): Payer: Medicare Other | Admitting: Anesthesiology

## 2022-08-13 ENCOUNTER — Other Ambulatory Visit: Payer: Self-pay

## 2022-08-13 ENCOUNTER — Encounter (HOSPITAL_COMMUNITY)
Admission: RE | Disposition: A | Discharge status: Home / Self Care | Payer: Self-pay | Source: Home / Self Care | Attending: Gastroenterology

## 2022-08-13 DIAGNOSIS — K7460 Unspecified cirrhosis of liver: Secondary | ICD-10-CM | POA: Diagnosis not present

## 2022-08-13 DIAGNOSIS — I81 Portal vein thrombosis: Secondary | ICD-10-CM | POA: Insufficient documentation

## 2022-08-13 DIAGNOSIS — I10 Essential (primary) hypertension: Secondary | ICD-10-CM | POA: Diagnosis not present

## 2022-08-13 DIAGNOSIS — K3189 Other diseases of stomach and duodenum: Secondary | ICD-10-CM | POA: Insufficient documentation

## 2022-08-13 DIAGNOSIS — I8500 Esophageal varices without bleeding: Secondary | ICD-10-CM | POA: Diagnosis present

## 2022-08-13 DIAGNOSIS — I8510 Secondary esophageal varices without bleeding: Secondary | ICD-10-CM | POA: Insufficient documentation

## 2022-08-13 DIAGNOSIS — I739 Peripheral vascular disease, unspecified: Secondary | ICD-10-CM | POA: Diagnosis not present

## 2022-08-13 DIAGNOSIS — Z79899 Other long term (current) drug therapy: Secondary | ICD-10-CM | POA: Insufficient documentation

## 2022-08-13 DIAGNOSIS — Z923 Personal history of irradiation: Secondary | ICD-10-CM | POA: Insufficient documentation

## 2022-08-13 DIAGNOSIS — Z9221 Personal history of antineoplastic chemotherapy: Secondary | ICD-10-CM | POA: Insufficient documentation

## 2022-08-13 DIAGNOSIS — K766 Portal hypertension: Secondary | ICD-10-CM | POA: Insufficient documentation

## 2022-08-13 DIAGNOSIS — Z853 Personal history of malignant neoplasm of breast: Secondary | ICD-10-CM | POA: Diagnosis not present

## 2022-08-13 DIAGNOSIS — F32A Depression, unspecified: Secondary | ICD-10-CM | POA: Diagnosis not present

## 2022-08-13 DIAGNOSIS — Z86718 Personal history of other venous thrombosis and embolism: Secondary | ICD-10-CM | POA: Insufficient documentation

## 2022-08-13 DIAGNOSIS — Z7901 Long term (current) use of anticoagulants: Secondary | ICD-10-CM | POA: Diagnosis not present

## 2022-08-13 DIAGNOSIS — M1990 Unspecified osteoarthritis, unspecified site: Secondary | ICD-10-CM

## 2022-08-13 HISTORY — PX: GASTRIC VARICES BANDING: SHX5519

## 2022-08-13 HISTORY — PX: ESOPHAGOGASTRODUODENOSCOPY: SHX5428

## 2022-08-13 SURGERY — EGD (ESOPHAGOGASTRODUODENOSCOPY)
Anesthesia: Monitor Anesthesia Care

## 2022-08-13 MED ORDER — ONDANSETRON HCL 4 MG/2ML IJ SOLN
INTRAMUSCULAR | Status: DC | PRN
  Administered 2022-08-13: 4 mg via INTRAVENOUS

## 2022-08-13 MED ORDER — LIDOCAINE HCL (CARDIAC) PF 100 MG/5ML IV SOSY
PREFILLED_SYRINGE | INTRAVENOUS | Status: DC | PRN
  Administered 2022-08-13: 60 mg via INTRAVENOUS

## 2022-08-13 MED ORDER — LACTATED RINGERS IV SOLN: INTRAVENOUS | Status: DC

## 2022-08-13 MED ORDER — PROPOFOL 500 MG/50ML IV EMUL
INTRAVENOUS | Status: DC | PRN
  Administered 2022-08-13: 135 ug/kg/min via INTRAVENOUS

## 2022-08-13 MED ORDER — SODIUM CHLORIDE 0.9 % IV SOLN: INTRAVENOUS | Status: DC

## 2022-08-13 NOTE — Anesthesia Preprocedure Evaluation (Signed)
Anesthesia Evaluation  Patient identified by MRN, date of birth, ID band Patient awake    Reviewed: Allergy & Precautions, NPO status , Patient's Chart, lab work & pertinent test results  Airway Mallampati: II  TM Distance: >3 FB Neck ROM: Full    Dental no notable dental hx. (+) Teeth Intact, Dental Advisory Given   Pulmonary neg pulmonary ROS,    Pulmonary exam normal breath sounds clear to auscultation       Cardiovascular hypertension, Pt. on home beta blockers + Peripheral Vascular Disease and + DVT  Normal cardiovascular exam Rhythm:Regular Rate:Normal     Neuro/Psych  Headaches, PSYCHIATRIC DISORDERS Depression    GI/Hepatic negative GI ROS, (+) Cirrhosis   Esophageal Varices    , Hepatitis -  Endo/Other  negative endocrine ROS  Renal/GU negative Renal ROS     Musculoskeletal  (+) Arthritis ,   Abdominal   Peds  Hematology   Anesthesia Other Findings History of esophageal varices  Reproductive/Obstetrics                             Anesthesia Physical  Anesthesia Plan  ASA: 3  Anesthesia Plan: MAC   Post-op Pain Management: Minimal or no pain anticipated   Induction: Intravenous  PONV Risk Score and Plan: 2 and Propofol infusion and Treatment may vary due to age or medical condition  Airway Management Planned: Nasal Cannula  Additional Equipment: None  Intra-op Plan:   Post-operative Plan:   Informed Consent: I have reviewed the patients History and Physical, chart, labs and discussed the procedure including the risks, benefits and alternatives for the proposed anesthesia with the patient or authorized representative who has indicated his/her understanding and acceptance.     Dental advisory given  Plan Discussed with: CRNA and Anesthesiologist  Anesthesia Plan Comments:         Anesthesia Quick Evaluation

## 2022-08-13 NOTE — Discharge Instructions (Signed)
YOU HAD AN ENDOSCOPIC PROCEDURE TODAY: Refer to the procedure report and other information in the discharge instructions given to you for any specific questions about what was found during the examination. If this information does not answer your questions, please call [facility] office at [phone] to clarify.   YOU SHOULD EXPECT: Some feelings of bloating in the abdomen. Passage of more gas than usual. Walking can help get rid of the air that was put into your GI tract during the procedure and reduce the bloating. If you had a lower endoscopy (such as a colonoscopy or flexible sigmoidoscopy) you may notice spotting of blood in your stool or on the toilet paper. Some abdominal soreness may be present for a day or two, also.  DIET: Your first meal following the procedure should be a light meal and then it is ok to progress to your normal diet. A half-sandwich or bowl of soup is an example of a good first meal. Heavy or fried foods are harder to digest and may make you feel nauseous or bloated. Drink plenty of fluids but you should avoid alcoholic beverages for 24 hours. If you had a esophageal dilation, please see attached instructions for diet.    ACTIVITY: Your care partner should take you home directly after the procedure. You should plan to take it easy, moving slowly for the rest of the day. You can resume normal activity the day after the procedure however YOU SHOULD NOT DRIVE, use power tools, machinery or perform tasks that involve climbing or major physical exertion for 24 hours (because of the sedation medicines used during the test).   SYMPTOMS TO REPORT IMMEDIATELY: Following upper endoscopy (EGD, EUS, ERCP, esophageal dilation) Vomiting of blood or coffee ground material  New, significant abdominal pain  New, significant chest pain or pain under the shoulder blades  Painful or persistently difficult swallowing  New shortness of breath  Black, tarry-looking or red, bloody stools  FOLLOW  UP:  If any biopsies were taken you will be contacted by phone or by letter within the next 1-3 weeks. Call [phone]  if you have not heard about the biopsies in 3 weeks.  Please also call with any specific questions about appointments or follow up tests. YOU HAD AN ENDOSCOPIC PROCEDURE TODAY: Refer to the procedure report and other information in the discharge instructions given to you for any specific questions about what was found during the examination. If this information does not answer your questions, please call [facility] office at [phone] to clarify.

## 2022-08-13 NOTE — Op Note (Signed)
[facility] Patient Name: Nicole [name] Procedure Date: 08/13/2022 MRN: [id number] Attending MD: [name] , MD Date of Birth: 1956/07/31 CSN: [id number] Age: 66 Admit Type: Outpatient Procedure:                Upper GI endoscopy Indications:              For therapy of esophageal varices Providers:                [name], MD, Benay Pillow, RN, Cherylynn Ridges,                            Technician, Arnoldo Hooker, CRNA Referring MD:             [facility] Complications:            No immediate complications. Estimated blood loss:                            None. Estimated Blood Loss:     Estimated blood loss: none. Procedure:                Pre-Anesthesia Assessment:                           - Prior to the procedure, a History and Physical                            was performed, and patient medications and                            allergies were reviewed. The patient's tolerance of                            previous anesthesia was also reviewed. The risks                            and benefits of the procedure and the sedation                            options and risks were discussed with the patient.                            All questions were answered, and informed consent                            was obtained. Prior Anticoagulants: The patient has                            taken Eliquis (apixaban), last dose was 1 day prior                            to procedure. ASA Grade Assessment: III - A patient  with severe systemic disease. After reviewing the                            risks and benefits, the patient was deemed in                            satisfactory condition to undergo the procedure.                           After obtaining informed consent, the endoscope was                            passed under direct vision. Throughout the                             procedure, the patient's blood pressure, pulse, and                            oxygen saturations were monitored continuously. The                            [id number]-H190 ([id number]) Olympus endoscope was introduced                            through the mouth, and advanced to the second part                            of duodenum. The upper GI endoscopy was                            accomplished without difficulty. The patient                            tolerated the procedure well. Scope In: Scope Out: Findings:      Grade II varices were found in the middle third of the esophagus and in       the lower third of the esophagus. They were medium in size. Three bands       were successfully placed with complete eradication, resulting in       deflation of varices. There was no bleeding during and at the end of the       procedure.      The Z-line was regular and was found 35 cm from the incisors.      Mild portal hypertensive gastropathy was found in the stomach.      The cardia and gastric fundus were normal on retroflexion.      The examined duodenum was normal. Impression:               - Grade II esophageal varices. Completely                            eradicated. Banded.                           - Z-line regular, 35 cm from the incisors.                           -  Portal hypertensive gastropathy.                           - Normal examined duodenum.                           - No specimens collected. Moderate Sedation:      Patient did not receive moderate sedation for this procedure, but       instead received monitored anesthesia care. Recommendation:           - Patient has a contact number available for                            emergencies. The signs and symptoms of potential                            delayed complications were discussed with the                            patient. Return to normal activities tomorrow.                             Written discharge instructions were provided to the                            patient.                           - Clear liquid diet for 6 hours.                           - Regular diet thereafter.                           - Repeat upper endoscopy in 1 year for retreatment. Procedure Code(s):        --- Professional ---                           [phone], Esophagogastroduodenoscopy, flexible,                            transoral; with band ligation of esophageal/gastric                            varices Diagnosis Code(s):        --- Professional ---                           I85.00, Esophageal varices without bleeding                           K76.6, Portal hypertension                           K31.89, Other diseases of stomach and duodenum CPT copyright 2019 [employer]. All rights reserved. The codes documented in this report are preliminary and upon coder review may  be revised to meet current compliance requirements. [name], MD 08/13/2022 10:05:55 AM This report has been signed electronically. Number of Addenda: 0

## 2022-08-13 NOTE — Transfer of Care (Signed)
Immediate Anesthesia Transfer of Care Note  Patient: Nicole [name]  Procedure(s) Performed: Procedure(s): ESOPHAGOGASTRODUODENOSCOPY (EGD) (N/A) GASTRIC VARICES BANDING (N/A)  Patient Location: PACU  Anesthesia Type:MAC  Level of Consciousness:  sedated, patient cooperative and responds to stimulation  Airway & Oxygen Therapy:Patient Spontanous Breathing and Patient connected to face mask oxgen  Post-op Assessment:  Report given to PACU RN and Post -op Vital signs reviewed and stable  Post vital signs:  Reviewed and stable  Last Vitals:  Vitals:   08/13/22 0852  BP: 136/76  Resp: 16  Temp: 36.5 C  SpO2: 21%    Complications: No apparent anesthesia complications

## 2022-08-14 ENCOUNTER — Telehealth: Payer: Self-pay | Admitting: *Deleted

## 2022-08-14 ENCOUNTER — Encounter (HOSPITAL_COMMUNITY): Payer: Self-pay | Admitting: Gastroenterology

## 2022-08-14 NOTE — Anesthesia Postprocedure Evaluation (Signed)
Anesthesia Post Note  Patient: Nicole [name]  Procedure(s) Performed: ESOPHAGOGASTRODUODENOSCOPY (EGD) GASTRIC VARICES BANDING     Patient location during evaluation: PACU Anesthesia Type: MAC Level of consciousness: awake and alert Pain management: pain level controlled Vital Signs Assessment: post-procedure vital signs reviewed and stable Respiratory status: spontaneous breathing, nonlabored ventilation, respiratory function stable and patient connected to nasal cannula oxygen Cardiovascular status: stable and blood pressure returned to baseline Postop Assessment: no apparent nausea or vomiting Anesthetic complications: no   No notable events documented.  Last Vitals:  Vitals:   08/13/22 1020 08/13/22 1030  BP: 131/60 139/69  Pulse: 68 65  Resp: 20 20  Temp:    SpO2: 100% 98%    Last Pain:  Vitals:   08/13/22 1016  TempSrc:   PainSc: 0-No pain   Pain Goal:                   [name]

## 2022-08-14 NOTE — Telephone Encounter (Signed)
This RN spoke with Suanne Marker in pathology per need for breast prognostic panel to be done on pathology from Dr Roxan Hockey on 07/01/2022.  Suanne Marker states she will place above request and send to [place] at [place].

## 2022-08-21 ENCOUNTER — Inpatient Hospital Stay: Payer: Medicare Other | Attending: Hematology and Oncology | Admitting: Hematology and Oncology

## 2022-08-21 ENCOUNTER — Other Ambulatory Visit: Payer: Self-pay

## 2022-08-21 ENCOUNTER — Encounter: Payer: Self-pay | Admitting: Hematology and Oncology

## 2022-08-21 VITALS — BP 129/85 | HR 95 | Temp 98.1°F | Resp 18 | Ht 64.0 in | Wt 128.8 lb

## 2022-08-21 DIAGNOSIS — K766 Portal hypertension: Secondary | ICD-10-CM | POA: Diagnosis not present

## 2022-08-21 DIAGNOSIS — Z170 Estrogen receptor positive status [ER+]: Secondary | ICD-10-CM | POA: Diagnosis not present

## 2022-08-21 DIAGNOSIS — Z5111 Encounter for antineoplastic chemotherapy: Secondary | ICD-10-CM | POA: Insufficient documentation

## 2022-08-21 DIAGNOSIS — D509 Iron deficiency anemia, unspecified: Secondary | ICD-10-CM | POA: Insufficient documentation

## 2022-08-21 DIAGNOSIS — I10 Essential (primary) hypertension: Secondary | ICD-10-CM | POA: Diagnosis not present

## 2022-08-21 DIAGNOSIS — C50411 Malignant neoplasm of upper-outer quadrant of right female breast: Secondary | ICD-10-CM | POA: Insufficient documentation

## 2022-08-21 DIAGNOSIS — I864 Gastric varices: Secondary | ICD-10-CM | POA: Insufficient documentation

## 2022-08-21 DIAGNOSIS — R161 Splenomegaly, not elsewhere classified: Secondary | ICD-10-CM | POA: Insufficient documentation

## 2022-08-21 DIAGNOSIS — Z86718 Personal history of other venous thrombosis and embolism: Secondary | ICD-10-CM | POA: Diagnosis not present

## 2022-08-21 DIAGNOSIS — M810 Age-related osteoporosis without current pathological fracture: Secondary | ICD-10-CM | POA: Insufficient documentation

## 2022-08-21 DIAGNOSIS — C7802 Secondary malignant neoplasm of left lung: Secondary | ICD-10-CM | POA: Diagnosis not present

## 2022-08-21 DIAGNOSIS — C7800 Secondary malignant neoplasm of unspecified lung: Secondary | ICD-10-CM

## 2022-08-21 DIAGNOSIS — C50911 Malignant neoplasm of unspecified site of right female breast: Secondary | ICD-10-CM | POA: Diagnosis not present

## 2022-08-21 DIAGNOSIS — D696 Thrombocytopenia, unspecified: Secondary | ICD-10-CM | POA: Insufficient documentation

## 2022-08-21 DIAGNOSIS — C7801 Secondary malignant neoplasm of right lung: Secondary | ICD-10-CM | POA: Insufficient documentation

## 2022-08-21 DIAGNOSIS — C778 Secondary and unspecified malignant neoplasm of lymph nodes of multiple regions: Secondary | ICD-10-CM | POA: Diagnosis not present

## 2022-08-21 DIAGNOSIS — D72819 Decreased white blood cell count, unspecified: Secondary | ICD-10-CM | POA: Diagnosis not present

## 2022-08-21 LAB — SURGICAL PATHOLOGY

## 2022-08-21 MED ORDER — PALBOCICLIB 125 MG PO CAPS: 125.0000 mg | ORAL_CAPSULE | Freq: Every day | ORAL | 3 refills | Status: DC

## 2022-08-21 MED ORDER — FULVESTRANT 250 MG/5ML IM SOSY: 500.0000 mg | PREFILLED_SYRINGE | Freq: Once | INTRAMUSCULAR | Status: DC

## 2022-08-21 NOTE — Progress Notes (Signed)
[employer]  [phone] [phone]    ID: Nicole [name]   DOB: 1956/09/09  MR#: [id number]  LKG#:[id number]  Patient Care Team: [name] as PCP - General (Family Medicine) Nicole [name], CRNP as Nurse Practitioner (Nurse Practitioner) Nicole Bookbinder, MD as Consulting Physician (General Surgery) Nicole Spates, MD (Inactive) as Consulting Physician (Gastroenterology) Nicole Juniper, MD as Consulting Physician (Gastroenterology) Nicole [name], Nicole Dad, MD (Inactive) as Consulting Physician (Oncology) OTHER MD:  CHIEF COMPLAINT:  Metastatic Breast Cancer (s/p right mastectomy)  CURRENT TREATMENT: Letrozole, denosumab/Prolia    INTERVAL HISTORY:  Nicole [name] returns today for follow-up of her estrogen receptor positive stage IV breast cancer.  Since last visit she had left lower lobe lobectomy with lymph node excision which showed carcinoma with mucinous features consistent with metastatic breast carcinoma, negative margin, parenchymal margin negative for carcinoma, immunohistochemical findings consistent with metastatic breast carcinoma ER +100% strong staining PR 20% staining, Ki-67 of 10 % (initially reported 99% but this was confirmed with Dr Nicole [name]) HER2 group 5 negative     COVID 19 VACCINATION STATUS: Status post [place] x2+ booster August 2021   BREAST CANCER HISTORY: From Dr. Collier Salina [name]'s 07/25/2010 note:  "This woman has not had any significant medical intervention for some time.  Her last mammogram was about seven years ago.  She noted a right breast mass about a year ago and did not seek immediate medical attention for this.  She ultimately, after some time, admitted this to her husband and self-referred herself for intervention.  She had a mammogram on 07/17/2010 with an ultrasound of the right breast.  This showed a lobulated mass upper right outer quadrant measuring at least 6.3 x 7.3 cm and large right  axillary lymph nodes were also noted.  There was skin thickening overlying the mass.  On physical exam, this mass was about an 8 cm with skin dimpling noted. Discolored area in the skin over the mass, fullness in the axilla.  Biopsy was recommended, which took place on 07/17/2010.  Pathology showed invasive ductal cancer involving both lymph node and breast.  The HER-2 was not amplified.  ER and PR both positive at 100% and 6% respectively.  Proliferative index was 12% involving both the lymph node and breast mass.  An MRI scan of both breasts was performed on 07/22/2010, essentially which showed a large heterogeneously enhancing mass of the right breast with washout kinetics measuring 6.4 x 4.4 x 5.0 cm.  Numerous satellite nodules were seen throughout the dominant mass.  There were several suspicious nodules in the upper central and upper inner quadrant of the right breast.  There were two enhancing subcentimeter nodules seen in the right pectoralis muscle.  No obvious chest wall nodules were seen; however, there was seen some metastatic adenopathy in the mediastinum as well as hila with a 2.4 x 2.0 cm mediastinal lymph node as well as 1.2 x 3.0 cm subcarinal lymph node.  There was a right hilar and probable left hilar adenopathy.  Bilateral pulmonary nodules were seen and a right T2 lesion was seen anterior right liver as well."  Her subsequent history is as detailed below.   PAST MEDICAL HISTORY: Past Medical History:  Diagnosis Date   Anemia    low iron   Arthritis    Blood transfusion 2012   Breast CA ([place])    breast ca dx 06/2010, stage 4, right , Lung Metasis.Surgery, Chemo, Radiation   Breast cancer ([place])  Clot    jugular  ?2015   Depression    Esophageal varices ([place])    Headache    Mirgraine- rare since menopause   Heart murmur    mild, no cardiologist, from birth   Hematemesis 08/2017   History of kidney stones    no pain- shows in CT- small 1 mm bilaterally- kidney    Hypertension    IBD (inflammatory bowel disease)    resolved   Idiopathic cirrhosis ([place])    pt denies   Peripheral vascular disease ([place]) few yrs ago   clot in right jugular   Portal hypertension ([place])    Radiation 07/21/12-09/07/12   5940 cGy   Restless legs     PAST SURGICAL HISTORY: Past Surgical History:  Procedure Laterality Date   BIOPSY  08/21/2020   Procedure: BIOPSY;  Surgeon: Nicole Juniper, MD;  [facility];  Service: Gastroenterology;;   COLONOSCOPY  2016   DILATION AND CURETTAGE OF UTERUS  2004?   ESOPHAGEAL BANDING  09/04/2017   Procedure: ESOPHAGEAL BANDING;  Surgeon: Nicole Juniper, MD;  Location: [facility] ENDOSCOPY;  Service: Gastroenterology;;   ESOPHAGEAL BANDING  11/27/2017   Procedure: ESOPHAGEAL BANDING;  Surgeon: Nicole Juniper, MD;  Location: [place];  Service: Gastroenterology;;   ESOPHAGEAL BANDING N/A 04/28/2018   Procedure: ESOPHAGEAL BANDING;  Surgeon: Nicole Juniper, MD;  [facility];  Service: Gastroenterology;  Laterality: N/A;   ESOPHAGEAL BANDING  11/17/2018   Procedure: ESOPHAGEAL BANDING;  Surgeon: Nicole Juniper, MD;  Location: [facility];  Service: Gastroenterology;;   ESOPHAGEAL BANDING  08/16/2019   Procedure: ESOPHAGEAL BANDING;  Surgeon: Nicole Juniper, MD;  Location: [facility];  Service: Gastroenterology;;   ESOPHAGEAL BANDING N/A 08/21/2020   Procedure: ESOPHAGEAL BANDING;  Surgeon: Nicole Juniper, MD;  [facility];  Service: Gastroenterology;  Laterality: N/A;   ESOPHAGOGASTRODUODENOSCOPY N/A 04/28/2018   Procedure: ESOPHAGOGASTRODUODENOSCOPY (EGD);  Surgeon: Nicole Juniper, MD;  Location: [facility];  Service: Gastroenterology;  Laterality: N/A;   ESOPHAGOGASTRODUODENOSCOPY N/A 11/17/2018   Procedure: ESOPHAGOGASTRODUODENOSCOPY (EGD);  Surgeon: Nicole Juniper, MD;  Location: [facility];  Service: Gastroenterology;  Laterality: N/A;   ESOPHAGOGASTRODUODENOSCOPY N/A 08/13/2022   Procedure: ESOPHAGOGASTRODUODENOSCOPY (EGD);  Surgeon:  Nicole Juniper, MD;  Location: [facility];  Service: Gastroenterology;  Laterality: N/A;   ESOPHAGOGASTRODUODENOSCOPY (EGD) WITH PROPOFOL N/A 04/11/2016   Procedure: ESOPHAGOGASTRODUODENOSCOPY (EGD) WITH PROPOFOL;  Surgeon: Clarene Essex, MD;  [facility];  Service: Endoscopy;  Laterality: N/A;   ESOPHAGOGASTRODUODENOSCOPY (EGD) WITH PROPOFOL N/A 07/08/2016   Procedure: ESOPHAGOGASTRODUODENOSCOPY (EGD) WITH PROPOFOL;  Surgeon: Nicole Spates, MD;  Location: [place];  Service: Endoscopy;  Laterality: N/A;   ESOPHAGOGASTRODUODENOSCOPY (EGD) WITH PROPOFOL N/A 03/03/2017   Procedure: ESOPHAGOGASTRODUODENOSCOPY (EGD) WITH PROPOFOL;  Surgeon: Nicole Spates, MD;  Location: [place];  Service: Endoscopy;  Laterality: N/A;   ESOPHAGOGASTRODUODENOSCOPY (EGD) WITH PROPOFOL N/A 09/04/2017   Procedure: ESOPHAGOGASTRODUODENOSCOPY (EGD) WITH PROPOFOL;  Surgeon: Nicole Juniper, MD;  Location: [place];  Service: Gastroenterology;  Laterality: N/A;   ESOPHAGOGASTRODUODENOSCOPY (EGD) WITH PROPOFOL N/A 11/27/2017   Procedure: ESOPHAGOGASTRODUODENOSCOPY (EGD);  Surgeon: Nicole Juniper, MD;  Location: [place];  Service: Gastroenterology;  Laterality: N/A;   ESOPHAGOGASTRODUODENOSCOPY (EGD) WITH PROPOFOL N/A 08/16/2019   Procedure: ESOPHAGOGASTRODUODENOSCOPY (EGD) WITH PROPOFOL;  Surgeon: Nicole Juniper, MD;  [facility];  Service: Gastroenterology;  Laterality: N/A;   ESOPHAGOGASTRODUODENOSCOPY (EGD) WITH PROPOFOL N/A 08/21/2020   Procedure: ESOPHAGOGASTRODUODENOSCOPY (EGD) WITH PROPOFOL;  Surgeon: Nicole Juniper, MD;  [facility];  Service: Gastroenterology;  Laterality: N/A;   ESOPHAGOGASTRODUODENOSCOPY (  EGD) WITH PROPOFOL N/A 08/24/2021   Procedure: ESOPHAGOGASTRODUODENOSCOPY (EGD) WITH PROPOFOL;  Surgeon: Nicole Juniper, MD;  [facility];  Service: Gastroenterology;  Laterality: N/A;   GASTRIC VARICES BANDING N/A 08/24/2021   Procedure: GASTRIC VARICES BANDING;  Surgeon: Nicole Juniper,  MD;  [facility];  Service: Gastroenterology;  Laterality: N/A;   GASTRIC VARICES BANDING N/A 08/13/2022   Procedure: GASTRIC VARICES BANDING;  Surgeon: Nicole Juniper, MD;  [facility];  Service: Gastroenterology;  Laterality: N/A;   HEMOSTASIS CLIP PLACEMENT  08/21/2020   Procedure: HEMOSTASIS CLIP PLACEMENT;  Surgeon: Nicole Juniper, MD;  [facility];  Service: Gastroenterology;;   INTERCOSTAL NERVE BLOCK Left 07/01/2022   Procedure: INTERCOSTAL NERVE BLOCK;  Surgeon: [name], MD;  Location: [place];  Service: Thoracic;  Laterality: Left;   IR GENERIC HISTORICAL  05/15/2016   IR RADIOLOGIST EVAL & MGMT 05/15/2016 Corrie Mckusick, DO [facility] INTER[facility] RAD   IR GENERIC HISTORICAL  01/14/2017   IR US GUIDE VASC ACCESS RIGHT 01/14/2017 Greggory Keen, MD [facility]   IR GENERIC HISTORICAL  01/14/2017   IR VENOGRAM HEPATIC W HEMODYNAMIC EVALUATION 01/14/2017 Greggory Keen, MD [facility]   IR RADIOLOGIST EVAL & MGMT  06/12/2017   IR RADIOLOGIST EVAL & MGMT  08/11/2018   IR RADIOLOGIST EVAL & MGMT  08/25/2019   IR RADIOLOGIST EVAL & MGMT  08/22/2020   IR RADIOLOGIST EVAL & MGMT  03/11/2022   MASTECTOMY Right 06/02/12   right breast with lymph node removal   NODE DISSECTION Left 07/01/2022   Procedure: NODE DISSECTION;  Surgeon: [name], MD;  Location: [facility] OR;  Service: Thoracic;  Laterality: Left;   PORT-A-CATH REMOVAL     PORTACATH PLACEMENT  2011   left side   RADIOLOGY WITH ANESTHESIA N/A 04/13/2016   Procedure: RADIOLOGY WITH ANESTHESIA;  Surgeon: Corrie Mckusick, DO;  Location: [place];  Service: Anesthesiology;  Laterality: N/A;   VIDEO BRONCHOSCOPY WITH ENDOBRONCHIAL NAVIGATION N/A 07/01/2022   Procedure: VIDEO BRONCHOSCOPY WITH ENDOBRONCHIAL NAVIGATION;  Surgeon: [name], MD;  [facility];  Service: Thoracic;  Laterality: N/A;   XI ROBOTIC ASSISTED THORACOSCOPY- SEGMENTECTOMY Left 07/01/2022   Procedure: XI ROBOTIC ASSISTED THORACOSCOPY-LEFT  LOWER LOBE BASILAR SEGMENTECTOMY;  Surgeon: [name], MD;  [facility];  Service: Thoracic;  Laterality: Left;    FAMILY HISTORY Family History  Problem Relation Age of Onset   COPD Mother    AAA (abdominal aortic aneurysm) Mother    COPD Father    Cancer Neg Hx   The patient's father died at the age of 40 from heart disease. Patient's mother died at the age of 5 status post CABG. The patient had no brothers, 2 sisters. There is no history of breast or ovarian cancer in the family.   GYNECOLOGIC HISTORY: Updated May 2018 Menarche age 9, she is GX P0. She does not recall when she went through menopause. She never took hormone replacement.   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 Husband Merry Proud is retired from [employer]. They have no children and no pets. They attend a local [employer].    ADVANCED DIRECTIVES: Not in place   HEALTH MAINTENANCE: Social History   Tobacco Use   Smoking status: Never   Smokeless tobacco: Never  Vaping Use   Vaping Use: Never used  Substance Use Topics   Alcohol  use: No   Drug use: No     Colonoscopy: Never  PAP: Remote  Bone density: 09/27/2013, osteoporosis with a T score of -2.8  Lipid panel: Not on file    Allergies  Allergen Reactions   Oxycodone Other (See Comments)    Nightmares    Current Outpatient Medications  Medication Sig Dispense Refill   denosumab (PROLIA) 60 MG/ML SOLN injection Inject 60 mg into the skin every 6 (six) months. Administer in upper arm, thigh, or abdomen     ELIQUIS 5 MG TABS tablet TAKE 1 TABLET BY MOUTH TWICE A DAY 60 tablet 2   folic acid (FOLVITE) 1 MG tablet Take 1 mg by mouth daily.     letrozole (FEMARA) 2.5 MG tablet Take 1 tablet (2.5 mg total) by mouth daily. 90 tablet 3   Milk Thistle 1000 MG CAPS Take 1,000 mg by mouth daily.     pantoprazole (PROTONIX) 40 MG  tablet Take 1 tablet (40 mg total) by mouth daily. 60 tablet 0   propranolol (INDERAL) 20 MG tablet Take 1 tablet (20 mg total) by mouth 2 (two) times daily. 60 tablet 0   venlafaxine XR (EFFEXOR-XR) 150 MG 24 hr capsule TAKE 1 CAPSULE BY MOUTH DAILY WITH BREAKFAST. 90 capsule 1   No current facility-administered medications for this visit.   Facility-Administered Medications Ordered in Other Visits  Medication Dose Route Frequency Provider Last Rate Last Admin   denosumab (PROLIA) injection 60 mg  60 mg Subcutaneous Once Nicole [name], Nicole Dad, MD        OBJECTIVE: white woman who appears younger than stated age  56:   08/21/22 1139  BP: 129/85  Pulse: 95  Resp: 18  Temp: 98.1 F (36.7 C)  TempSrc: Temporal  SpO2: 98%  Weight: 128 lb 12.8 oz (58.4 kg)  Height: _0  (1.626 m)    PE deferred in lieu of counseling.   LAB RESULTS:  CMP     Component Value Date/Time   NA 139 07/05/2022 0319   NA 142 11/17/2017 1355   K 3.8 07/05/2022 0319   K 4.4 11/17/2017 1355   CL 111 07/05/2022 0319   CL 109 (H) 04/20/2013 0859   CO2 22 07/05/2022 0319   CO2 22 11/17/2017 1355   GLUCOSE 100 (H) 07/05/2022 0319   GLUCOSE 86 11/17/2017 1355   GLUCOSE 91 04/20/2013 0859   BUN 16 07/05/2022 0319   BUN 12.5 11/17/2017 1355   CREATININE 0.86 07/05/2022 0319   CREATININE 1.03 (H) 02/15/2022 1231   CREATININE 1.1 11/17/2017 1355   CALCIUM 7.9 (L) 07/05/2022 0319   CALCIUM 9.2 11/17/2017 1355   PROT 5.2 (L) 07/03/2022 0357   PROT 7.0 11/17/2017 1355   ALBUMIN 3.0 (L) 07/03/2022 0357   ALBUMIN 3.8 11/17/2017 1355   AST 25 07/03/2022 0357   AST 18 02/15/2022 1231   AST 23 11/17/2017 1355   ALT 16 07/03/2022 0357   ALT 13 02/15/2022 1231   ALT 22 11/17/2017 1355   ALKPHOS 41 07/03/2022 0357   ALKPHOS 55 11/17/2017 1355   BILITOT 0.6 07/03/2022 0357   BILITOT 0.7 02/15/2022 1231   BILITOT 0.49 11/17/2017 1355   GFRNONAA >60 07/05/2022 0319   GFRNONAA 60 (L) 02/15/2022 1231    GFRNONAA 53 (L) 05/09/2016 1104   GFRAA 53 (L) 06/12/2020 1146   GFRAA 55 (L) 12/13/2019 1200   GFRAA 61 05/09/2016 1104    No results found for: "TOTALPROTELP", "ALBUMINELP", "A1GS", "A2GS", "BETS", "BETA2SER", "GAMS", "  MSPIKE", "SPEI"  Lab Results  Component Value Date   WBC 8.0 07/05/2022   NEUTROABS 5.8 02/15/2022   HGB 10.1 (L) 07/05/2022   HCT 31.4 (L) 07/05/2022   MCV 87.0 07/05/2022   PLT 100 (L) 07/05/2022    Lab Results  Component Value Date   LABCA2 18 08/21/2012    No components found for: "XIPJAS505"  No results for input(s): "INR" in the last 168 hours.  Lab Results  Component Value Date   LABCA2 18 08/21/2012    No results found for: "LZJ673"  No results found for: "CAN125"  No results found for: "ALP379"  Lab Results  Component Value Date   CA2729 26.9 12/13/2019    No components found for: "HGQUANT"  No results found for: "CEA1", "CEA" / No results found for: "CEA1", "CEA"   No results found for: "AFPTUMOR"  No results found for: "CHROMOGRNA"  No results found for: "KPAFRELGTCHN", "LAMBDASER", "KAPLAMBRATIO" (kappa/lambda light chains)  No results found for: "HGBA", "HGBA2QUANT", "HGBFQUANT", "HGBSQUAN" (Hemoglobinopathy evaluation)   Lab Results  Component Value Date   LDH 195 08/21/2012    Lab Results  Component Value Date   IRON 27 (L) 04/11/2016   TIBC 340 04/11/2016   IRONPCTSAT 8 (L) 04/11/2016   (Iron and TIBC)  Lab Results  Component Value Date   FERRITIN 18 04/11/2016    Urinalysis    Component Value Date/Time   COLORURINE YELLOW 06/28/2022 1122   APPEARANCEUR CLEAR 06/28/2022 1122   LABSPEC 1.015 06/28/2022 1122   LABSPEC 1.010 04/29/2016 1118   PHURINE 6.0 06/28/2022 1122   GLUCOSEU NEGATIVE 06/28/2022 1122   GLUCOSEU Negative 04/29/2016 1118   HGBUR NEGATIVE 06/28/2022 1122   BILIRUBINUR NEGATIVE 06/28/2022 1122   BILIRUBINUR Negative 04/29/2016 1118   KETONESUR NEGATIVE 06/28/2022 1122   PROTEINUR  NEGATIVE 06/28/2022 1122   UROBILINOGEN 0.2 04/29/2016 1118   NITRITE NEGATIVE 06/28/2022 1122   LEUKOCYTESUR NEGATIVE 06/28/2022 1122   LEUKOCYTESUR Trace 04/29/2016 1118    STUDIES: DG Chest 2 View  Result Date: 08/06/2022 CLINICAL DATA:  Lung nodule EXAM: CHEST - 2 VIEW COMPARISON:  Chest x-ray dated July 04, 2022 FINDINGS: Cardiac and mediastinal contours are unchanged. Small to moderate left pleural effusion, similar in size when compared with the prior exam. Mild linear opacities of the mid right lung, likely due to scarring or atelectasis. No evidence of pneumothorax. IMPRESSION: Postsurgical changes of the left lung with small to moderate left pleural effusion. Electronically Signed   By: Yetta Glassman M.D.   On: 08/06/2022 09:45    COMPARISON: Abdominal ultrasound November 05, 2021   TECHNIQUE:  Real time limited abdomen ultrasound was performed.   IMAGE QUALITY: Satisfactory, of diagnostic value.   FINDINGS:   Limited sonographic views of the abdomen demonstrates mild abdominal ascites. There is no large pocket to safely drain.    IMPRESSION:  IMPRESSION:  1. Mild abdominal ascites.  2. No large enough pocket to safely drain.    ASSESSMENT: 66 y.o. [place] woman presenting June 2011 with stage IV breast cancer involving the right breast upper outer quadrant, right axilla, mediastinal lymph nodes, and both lungs, but not the brain, liver, or bones  (1) positive right breast and right axillary lymph node biopsies 07/17/2010 of a clinical T4 N2 M1 invasive ductal carcinoma, grade 1, estrogen receptor 100% positive, progesterone receptor 6% positive, with an MIB-1 of 12%, and no HER-2 amplification.  (2) participated in Phase II tessetaxel study, receiving 2 cycles complicated by thrombocytopenia,  anemia requiring transfusion, transaminase elevation, and afebrile neutropenia. Off-study as of September 2011. Chest CT scan September 2011 did show evidence of response.  (3) on  letrozole as of October 2011, with continuing response and good tolerance   (4) 06/02/2012 underwent right mastectomy and axillary lymph node sampling (3 lymph nodes removed, all with viable cancer as well as evidence of treatment effect) for a ypT2 ypN1-2 invasive ductal carcinoma, grade 1, 98% estrogen receptor positive, progesterone receptor negative, with no HER-2 amplification  (5) left jugular vein DVT documented 06/16/2012; left sided Port-A-Cath removed mid July 2015; Coumadin stopped 09/07/2014.  (a) Left brachiocephalic v. slightly narrowed, with some Left lower neck collateralization noted on chest CT December 2015  (6) osteoporosis, was on alendronate, switched to denosumab/Prolia 11/07/2016  (a) bone density in 10/2018 shows osteopenia T score of -2.2  (7) portal hypertension, with splenomegaly, gastric varices, and intestinal edema  (a)  mild persistent leukopenia and thrombocytopenia likely due to splenomegaly  (b) iron deficiency secondary to above, status post Feraheme x2 December 2018  (8) restaging studies:  (a) chest CT 12/26/2017 shows a 4.5 mm left lower lobe pulmonary nodule which is unchanged.  There was no other evidence of active disease  (b) restaging chest CT in 05/2019 shows no change in bilateral pulmonary nodules, prominent mediastinal lymph nodes are stable  (9) CT scan of the abdomen 11/03/2021 shows thrombus in the portal, splenic and superior mesenteric vein  (A) Heparin started 11/03/2021  10. PET scan 4/96/7591, low metabolic activity of right lower pulmonary nodule. No change in size of CT 02/27/2022, Carcinoma with mucinous features consistent with metastatic breast carcinoma, negative margins, no LN involvement.  Prognostics showed ER +100% strong staining, PR +20% staining, Ki-67 of 10% and HER2 2+ by IHC and negative by FISH  PLAN:  Given metastatic breast cancer progression on letrozole, I have discussed about changing anti estrogen therapy to  faslodex.  We discussed mechanism of action and adverse effects of faslodex today. We have also discussed about adding Ibrance with faslodex. We also discussed mechanism of action of Ibrance, adverse effects of Ibrance including fatigue, cytopenias and neutropenic fever. Foundation one testing will be sent on the specimen. Sent a scheduling message to have this faslodex and prolia scheduled to a sooner date. RTC in 5 weeks with me.  Total encounter time 30 minutes.   *Total Encounter Time as defined by the Centers for Medicare and Medicaid Services includes, in addition to the face-to-face time of a patient visit (documented in the note above) non-face-to-face time: obtaining and reviewing outside history, ordering and reviewing medications, tests or procedures, care coordination (communications with other health care professionals or caregivers) and documentation in the medical record.

## 2022-08-22 ENCOUNTER — Telehealth: Payer: Self-pay | Admitting: Pharmacy Technician

## 2022-08-22 ENCOUNTER — Other Ambulatory Visit: Payer: Self-pay | Admitting: *Deleted

## 2022-08-22 ENCOUNTER — Encounter: Payer: Self-pay | Admitting: Hematology and Oncology

## 2022-08-22 ENCOUNTER — Telehealth: Payer: Self-pay

## 2022-08-22 ENCOUNTER — Other Ambulatory Visit (HOSPITAL_COMMUNITY): Payer: Self-pay

## 2022-08-22 MED ORDER — PALBOCICLIB 125 MG PO TABS
125.0000 mg | ORAL_TABLET | Freq: Every day | ORAL | 3 refills | Status: DC
  Filled 2022-08-22: qty 21, 21d supply, fill #0

## 2022-08-22 NOTE — Telephone Encounter (Signed)
Oral Oncology Patient Advocate Encounter  After completing a benefits investigation, prior authorization for Nicole [name] is not required at this time through Medicare Part D.  Patient's copay is $848.13.    Prior authorization on file 08/22/2022 until 08/22/2023   Lady Deutscher, [place] Patient Advocate Specialist [place] Patient Advocate Team Direct Number: [phone]  Fax: [phone]

## 2022-08-22 NOTE — Telephone Encounter (Signed)
Oral Oncology Patient Advocate Encounter   Began application for assistance for Ibrance through [employer].   Application will be submitted upon completion of necessary supporting documentation.   [employer] phone number [phone].   I will continue to check the status until final determination.   Daianna Vasques, CPhT-Adv Pharmacy Patient Advocate Specialist [facility] Patient Advocate Team Direct Number: [phone]  Fax: [phone]   

## 2022-08-22 NOTE — Telephone Encounter (Signed)
Oral Oncology Patient Advocate Encounter  Reached out and spoke with patient regarding PAP paperwork, explained that I would send it to their preferred email via [employer].   Confirmed email address: [email or web address].    Patient expressed understanding and consent.  Will follow up once paperwork has been signed and returned.   Lady Deutscher, CPhT-Adv Pharmacy Patient Advocate Specialist [place] Patient Advocate Team Direct Number: [phone]  Fax: [phone]

## 2022-08-22 NOTE — Telephone Encounter (Signed)
Oral Oncology Pharmacist Encounter  Received new prescription for palbociclib [facility]) for the treatment of metastatic, ER positive, HER2 negative breast cancer in conjunction with faslodex, planned duration until disease progression or unacceptable toxicity.  Labs from 07/05/22 (CBC and BMP) and 07/03/22 (CMP) assessed, no interventions needed. Prescription dose and frequency assessed, requested prescription be sent in for tablets compared to capsules.  Current medication list in Epic reviewed, DDIs with Ibrance capsules identified: - pantoprazole: PPI can decrease therapeutic effect of Ibrance (less with tablets than capsules).   Evaluated chart and no patient barriers to medication adherence noted.   Patient agreement for treatment documented in MD note on 08/21/2022.  Prescription has been e-scribed to the [facility] for benefits analysis and approval.  Oral Oncology Clinic will continue to follow for insurance authorization, copayment issues, initial counseling and start date.  Drema Halon, PharmD Hematology/Oncology Clinical Pharmacist [facility] [phone] 08/22/2022 8:39 AM

## 2022-08-23 ENCOUNTER — Other Ambulatory Visit (HOSPITAL_COMMUNITY): Payer: Self-pay

## 2022-08-25 IMAGING — US IR CHEST US
1 series · 6 of 6 positions shown · non-contrast
Comparison: PET-CT 05/24/2022

CLINICAL DATA: Right-sided pleural effusion

EXAM:
CHEST ULTRASOUND

[Series 1: ir (person_name)/(person_name) · 6 acquisitions, 6 frames shown]
[im 1/6]
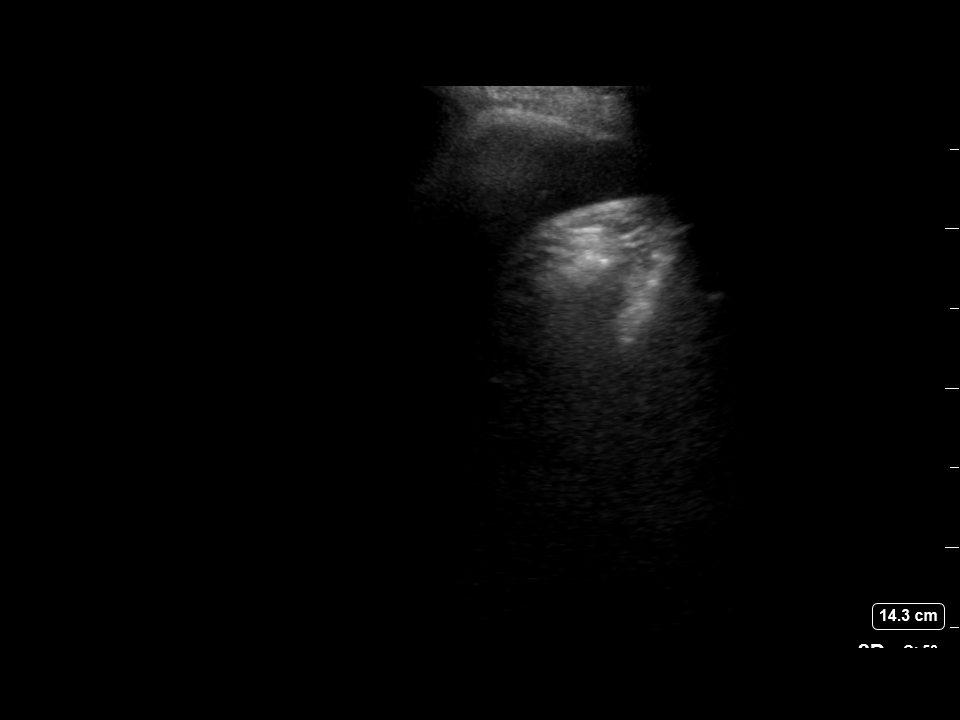
[im 2/6]
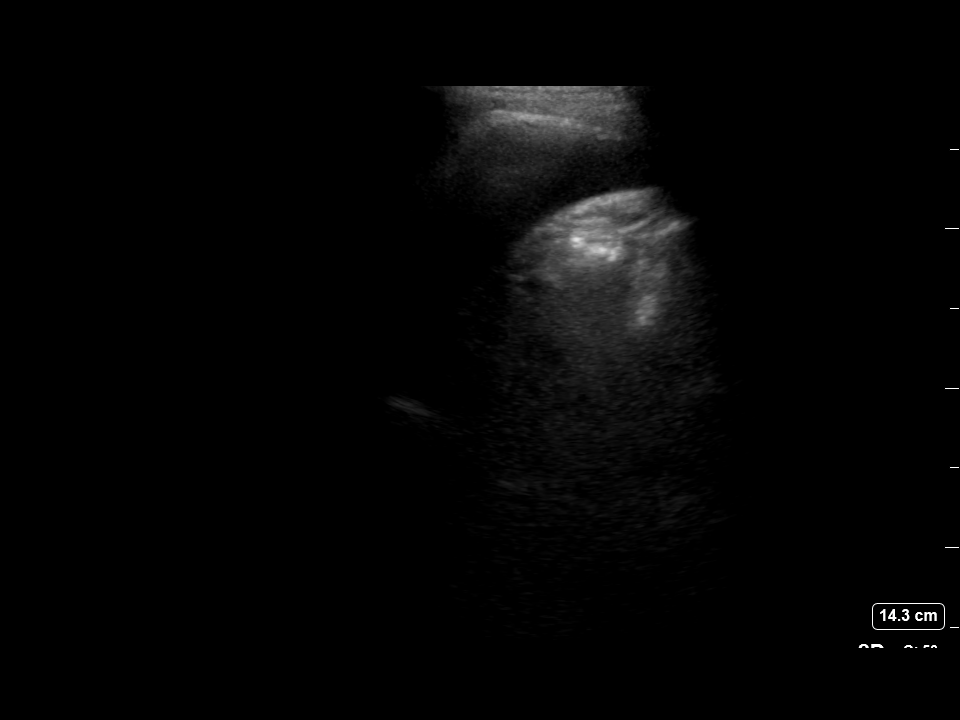
[im 3/6]
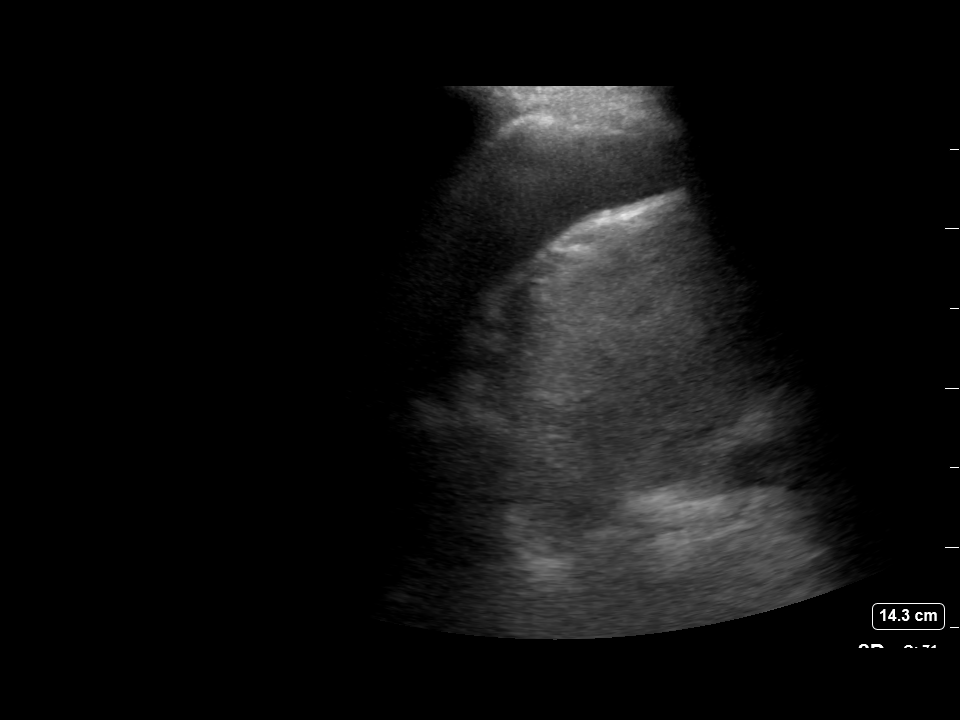
[im 4/6]
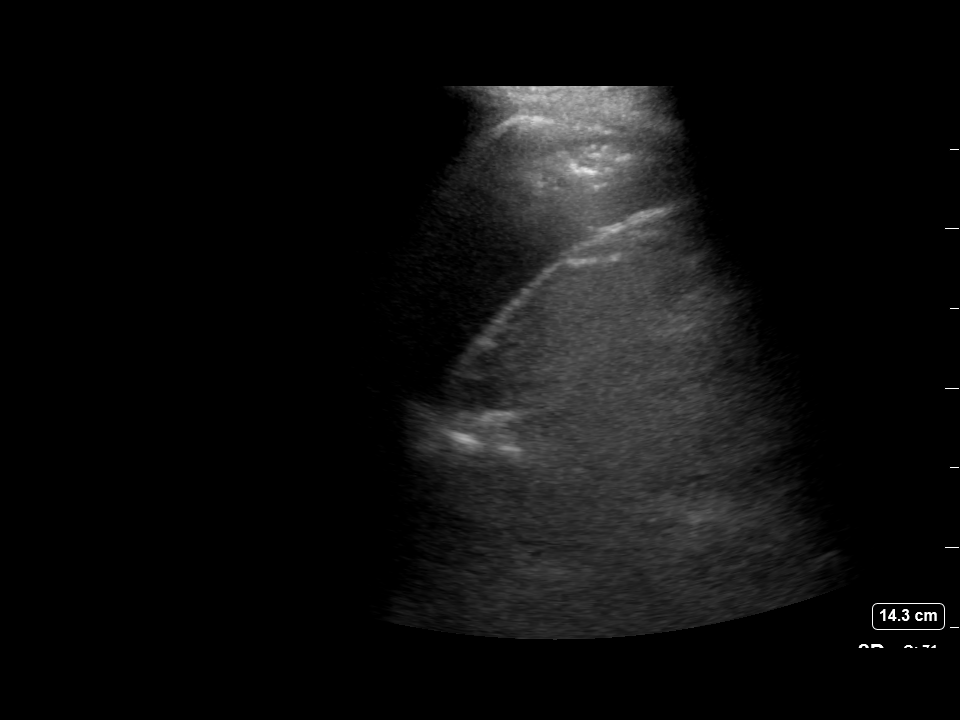
[im 5/6]
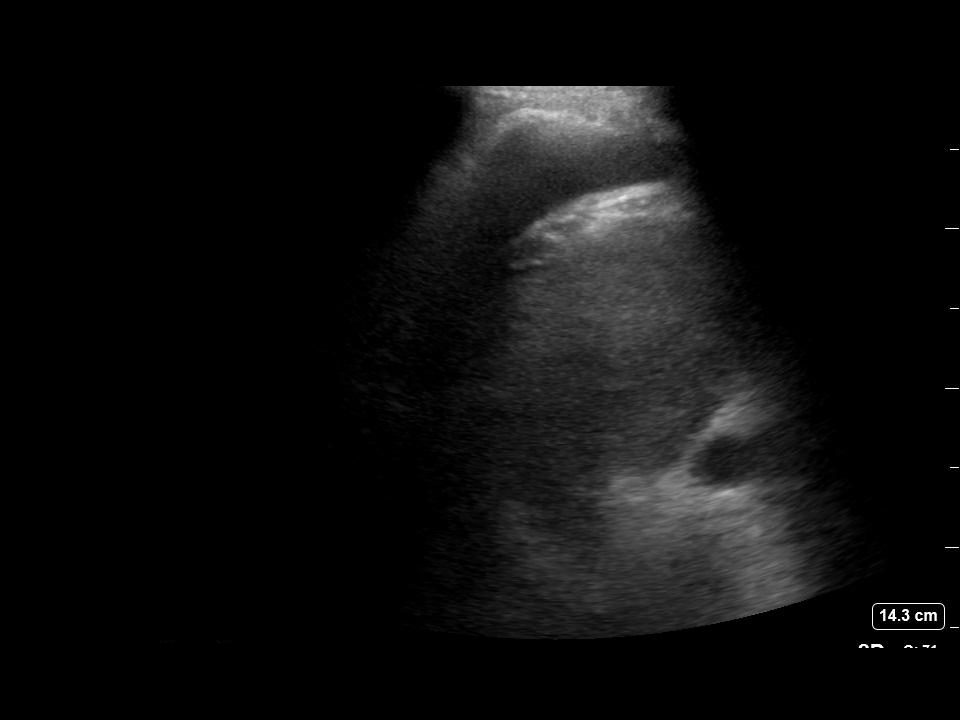
[im 6/6]
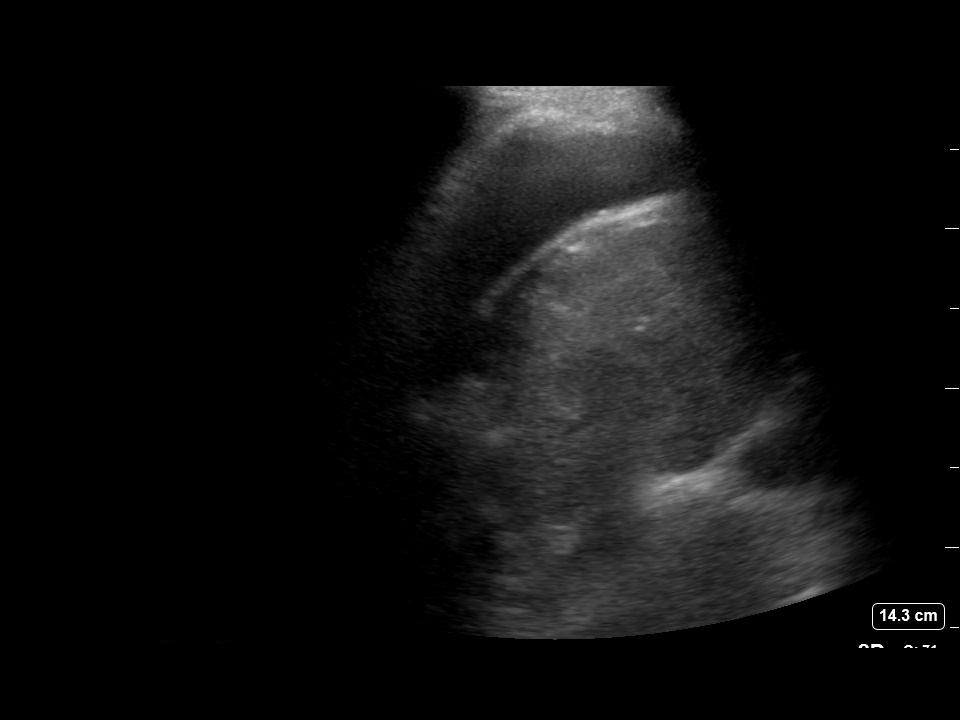

[6 of 6 positions shown; findings below may reference images not displayed]

FINDINGS: Sonographic evaluation of the right chest demonstrates a small
pleural effusion. The lung is highly mobile and within the access
window. Thoracentesis was not performed due to small fluid volume
and concern for possible inadvertent pneumothorax.
IMPRESSION: Small volume pleural effusion, insufficient for thoracentesis.

## 2022-08-26 ENCOUNTER — Other Ambulatory Visit: Payer: Self-pay | Admitting: *Deleted

## 2022-08-26 DIAGNOSIS — C50911 Malignant neoplasm of unspecified site of right female breast: Secondary | ICD-10-CM

## 2022-08-27 ENCOUNTER — Inpatient Hospital Stay: Payer: Medicare Other

## 2022-08-27 ENCOUNTER — Other Ambulatory Visit: Payer: Self-pay

## 2022-08-27 VITALS — BP 141/85 | HR 90 | Temp 98.2°F | Resp 18

## 2022-08-27 DIAGNOSIS — C50911 Malignant neoplasm of unspecified site of right female breast: Secondary | ICD-10-CM

## 2022-08-27 DIAGNOSIS — Z5111 Encounter for antineoplastic chemotherapy: Secondary | ICD-10-CM | POA: Diagnosis not present

## 2022-08-27 DIAGNOSIS — M810 Age-related osteoporosis without current pathological fracture: Secondary | ICD-10-CM

## 2022-08-27 LAB — CMP (CANCER CENTER ONLY)
ALT: 13 U/L (ref 0–44)
AST: 18 U/L (ref 15–41)
Albumin: 4.2 g/dL (ref 3.5–5.0)
Alkaline Phosphatase: 78 U/L (ref 38–126)
Anion gap: 7 (ref 5–15)
BUN: 16 mg/dL (ref 8–23)
CO2: 24 mmol/L (ref 22–32)
Calcium: 9.5 mg/dL (ref 8.9–10.3)
Chloride: 109 mmol/L (ref 98–111)
Creatinine: 0.94 mg/dL (ref 0.44–1.00)
GFR, Estimated: >60 mL/min (ref >60)
Glucose, Bld: 97 mg/dL (ref 70–99)
Potassium: 3.9 mmol/L (ref 3.5–5.1)
Sodium: 140 mmol/L (ref 135–145)
Total Bilirubin: 0.4 mg/dL (ref 0.3–1.2)
Total Protein: 6.9 g/dL (ref 6.5–8.1)

## 2022-08-27 LAB — CBC WITH DIFFERENTIAL (CANCER CENTER ONLY)
Abs Immature Granulocytes: 0.04 10*3/uL (ref 0.00–0.07)
Basophils Absolute: 0 10*3/uL (ref 0.0–0.1)
Basophils Relative: 1 %
Eosinophils Absolute: 0.4 10*3/uL (ref 0.0–0.5)
Eosinophils Relative: 6 %
HCT: 39.9 % (ref 36.0–46.0)
Hemoglobin: 12.5 g/dL (ref 12.0–15.0)
Immature Granulocytes: 1 %
Lymphocytes Relative: 10 %
Lymphs Abs: 0.7 10*3/uL (ref 0.7–4.0)
MCH: 24.4 pg — ABNORMAL LOW (ref 26.0–34.0)
MCHC: 31.3 g/dL (ref 30.0–36.0)
MCV: 77.9 fL — ABNORMAL LOW (ref 80.0–100.0)
Monocytes Absolute: 0.5 10*3/uL (ref 0.1–1.0)
Monocytes Relative: 8 %
Neutro Abs: 4.8 10*3/uL (ref 1.7–7.7)
Neutrophils Relative %: 74 %
Platelet Count: 141 10*3/uL — ABNORMAL LOW (ref 150–400)
RBC: 5.12 MIL/uL — ABNORMAL HIGH (ref 3.87–5.11)
RDW: 14.8 % (ref 11.5–15.5)
WBC Count: 6.4 10*3/uL (ref 4.0–10.5)
nRBC: 0 % (ref 0.0–0.2)

## 2022-08-27 MED ORDER — DENOSUMAB 60 MG/ML ~~LOC~~ SOSY
60.0000 mg | PREFILLED_SYRINGE | Freq: Once | SUBCUTANEOUS | Status: AC
  Administered 2022-08-27: 60 mg via SUBCUTANEOUS
  Filled 2022-08-27: qty 1

## 2022-08-27 MED ORDER — FULVESTRANT 250 MG/5ML IM SOSY
500.0000 mg | PREFILLED_SYRINGE | Freq: Once | INTRAMUSCULAR | Status: AC
  Administered 2022-08-27: 500 mg via INTRAMUSCULAR
  Filled 2022-08-27: qty 10

## 2022-08-27 NOTE — Telephone Encounter (Signed)
Oral Oncology Patient Advocate Encounter   Received final signatures for application.   Submitted application for assistance for Leslee Home to [employer].   Application submitted via fax to [phone]     I will continue to check the status until final determination.   Lady Deutscher, CPhT-Adv Oncology Pharmacy Patient [place] Direct Number: [phone]  Fax: [phone]

## 2022-08-30 NOTE — Telephone Encounter (Signed)
Oral Oncology Patient Advocate Encounter   Received notification that the application for assistance for Ibrance through [place] has been approved.   [employer] [facility] phone number [phone].   Effective dates: 08/30/2022 through 12/29/2022  I have spoken to the patient.  Lady Deutscher, CPhT-Adv Oncology Pharmacy Patient [place] Direct Number: [phone]  Fax: [phone]

## 2022-09-03 ENCOUNTER — Other Ambulatory Visit (HOSPITAL_COMMUNITY): Payer: Self-pay

## 2022-09-03 NOTE — Telephone Encounter (Signed)
Oral Chemotherapy Pharmacist Encounter  I spoke with patient for overview of: Ibrance for the treatment of metastatic, hormone-receptor positive, HER2 receptor negative breast cancer, in combination with faslodex, planned duration until disease progression or unacceptable toxicity.   Counseled patient on administration, dosing, side effects, monitoring, drug-food interactions, safe handling, storage, and disposal.  Patient will take Ibrance 198m tablets, 1 tablet by mouth once daily, with or without food, taken for 3 weeks on, 1 week off, and repeated.  Patient knows to avoid grapefruit and grapefruit juice while on treatment with Ibrance.  Ibrance start date: 09/05/2022  Adverse effects include but are not limited to: fatigue, hair loss, GI upset, nausea, decreased blood counts, and increased upper respiratory infections. Severe, life-threatening, and/or fatal interstitial lung disease (ILD) and/or pneumonitis may occur with CDK 4/6 inhibitors.  Patient will obtain anti diarrheal and alert the office of 4 or more loose stools above baseline.  Reviewed with patient importance of keeping a medication schedule and plan for any missed doses. No barriers to medication adherence identified.  Medication reconciliation performed and medication/allergy list updated.  Insurance authorization for ILeslee Homehas been obtained. Patient receives medication from patient assistance program and will be delivered on 09/04/22.  Patient informed the pharmacy will reach out 5-7 days prior to needing next fill of Ibrance to coordinate continued medication acquisition to prevent break in therapy.  All questions answered.  Mrs. MMccollomvoiced understanding and appreciation.   Medication education handout placed in mail for patient. Patient knows to call the office with questions or concerns. Oral Chemotherapy Clinic phone number provided to patient.   KDrema Halon PharmD Hematology/Oncology Clinical  Pharmacist [phone]/04/2022   1:57 PM

## 2022-09-10 ENCOUNTER — Other Ambulatory Visit: Payer: Self-pay

## 2022-09-10 ENCOUNTER — Inpatient Hospital Stay: Payer: Medicare Other | Attending: Hematology and Oncology

## 2022-09-10 VITALS — BP 139/85 | HR 85 | Temp 98.5°F | Resp 15

## 2022-09-10 DIAGNOSIS — Z170 Estrogen receptor positive status [ER+]: Secondary | ICD-10-CM | POA: Insufficient documentation

## 2022-09-10 DIAGNOSIS — Z5111 Encounter for antineoplastic chemotherapy: Secondary | ICD-10-CM | POA: Insufficient documentation

## 2022-09-10 DIAGNOSIS — C779 Secondary and unspecified malignant neoplasm of lymph node, unspecified: Secondary | ICD-10-CM | POA: Insufficient documentation

## 2022-09-10 DIAGNOSIS — M810 Age-related osteoporosis without current pathological fracture: Secondary | ICD-10-CM

## 2022-09-10 DIAGNOSIS — C7802 Secondary malignant neoplasm of left lung: Secondary | ICD-10-CM | POA: Diagnosis not present

## 2022-09-10 DIAGNOSIS — C7801 Secondary malignant neoplasm of right lung: Secondary | ICD-10-CM | POA: Diagnosis not present

## 2022-09-10 DIAGNOSIS — C50411 Malignant neoplasm of upper-outer quadrant of right female breast: Secondary | ICD-10-CM | POA: Diagnosis not present

## 2022-09-10 MED ORDER — FULVESTRANT 250 MG/5ML IM SOSY
500.0000 mg | PREFILLED_SYRINGE | Freq: Once | INTRAMUSCULAR | Status: AC
  Administered 2022-09-10: 500 mg via INTRAMUSCULAR
  Filled 2022-09-10: qty 10

## 2022-09-12 ENCOUNTER — Encounter: Payer: Self-pay | Admitting: Hematology and Oncology

## 2022-09-20 ENCOUNTER — Other Ambulatory Visit: Payer: Self-pay | Admitting: *Deleted

## 2022-09-20 DIAGNOSIS — C50411 Malignant neoplasm of upper-outer quadrant of right female breast: Secondary | ICD-10-CM

## 2022-09-23 ENCOUNTER — Inpatient Hospital Stay: Payer: Medicare Other

## 2022-09-23 ENCOUNTER — Other Ambulatory Visit: Payer: Self-pay

## 2022-09-23 ENCOUNTER — Other Ambulatory Visit: Payer: Self-pay | Admitting: Thoracic Surgery (Cardiothoracic Vascular Surgery)

## 2022-09-23 ENCOUNTER — Inpatient Hospital Stay (HOSPITAL_BASED_OUTPATIENT_CLINIC_OR_DEPARTMENT_OTHER): Payer: Medicare Other | Admitting: Hematology and Oncology

## 2022-09-23 ENCOUNTER — Encounter: Payer: Self-pay | Admitting: Hematology and Oncology

## 2022-09-23 VITALS — BP 122/72 | HR 82 | Temp 97.7°F | Resp 18 | Ht 64.0 in | Wt 131.5 lb

## 2022-09-23 DIAGNOSIS — M810 Age-related osteoporosis without current pathological fracture: Secondary | ICD-10-CM

## 2022-09-23 DIAGNOSIS — C7800 Secondary malignant neoplasm of unspecified lung: Secondary | ICD-10-CM | POA: Diagnosis not present

## 2022-09-23 DIAGNOSIS — Z5111 Encounter for antineoplastic chemotherapy: Secondary | ICD-10-CM | POA: Diagnosis not present

## 2022-09-23 DIAGNOSIS — C50911 Malignant neoplasm of unspecified site of right female breast: Secondary | ICD-10-CM

## 2022-09-23 DIAGNOSIS — R911 Solitary pulmonary nodule: Secondary | ICD-10-CM

## 2022-09-23 DIAGNOSIS — C50411 Malignant neoplasm of upper-outer quadrant of right female breast: Secondary | ICD-10-CM

## 2022-09-23 LAB — CMP (CANCER CENTER ONLY)
ALT: 11 U/L (ref 0–44)
AST: 14 U/L — ABNORMAL LOW (ref 15–41)
Albumin: 4 g/dL (ref 3.5–5.0)
Alkaline Phosphatase: 54 U/L (ref 38–126)
Anion gap: 7 (ref 5–15)
BUN: 18 mg/dL (ref 8–23)
CO2: 22 mmol/L (ref 22–32)
Calcium: 8.4 mg/dL — ABNORMAL LOW (ref 8.9–10.3)
Chloride: 110 mmol/L (ref 98–111)
Creatinine: 1.07 mg/dL — ABNORMAL HIGH (ref 0.44–1.00)
GFR, Estimated: 57 mL/min — ABNORMAL LOW (ref >60)
Glucose, Bld: 94 mg/dL (ref 70–99)
Potassium: 4 mmol/L (ref 3.5–5.1)
Sodium: 139 mmol/L (ref 135–145)
Total Bilirubin: 0.7 mg/dL (ref 0.3–1.2)
Total Protein: 6.7 g/dL (ref 6.5–8.1)

## 2022-09-23 LAB — CBC WITH DIFFERENTIAL (CANCER CENTER ONLY)
Abs Immature Granulocytes: 0.01 10*3/uL (ref 0.00–0.07)
Basophils Absolute: 0 10*3/uL (ref 0.0–0.1)
Basophils Relative: 1 %
Eosinophils Absolute: 0.1 10*3/uL (ref 0.0–0.5)
Eosinophils Relative: 3 %
HCT: 34.7 % — ABNORMAL LOW (ref 36.0–46.0)
Hemoglobin: 11 g/dL — ABNORMAL LOW (ref 12.0–15.0)
Immature Granulocytes: 1 %
Lymphocytes Relative: 27 %
Lymphs Abs: 0.6 10*3/uL — ABNORMAL LOW (ref 0.7–4.0)
MCH: 25.2 pg — ABNORMAL LOW (ref 26.0–34.0)
MCHC: 31.7 g/dL (ref 30.0–36.0)
MCV: 79.6 fL — ABNORMAL LOW (ref 80.0–100.0)
Monocytes Absolute: 0.1 10*3/uL (ref 0.1–1.0)
Monocytes Relative: 4 %
Neutro Abs: 1.4 10*3/uL — ABNORMAL LOW (ref 1.7–7.7)
Neutrophils Relative %: 64 %
Platelet Count: 72 10*3/uL — ABNORMAL LOW (ref 150–400)
RBC: 4.36 MIL/uL (ref 3.87–5.11)
RDW: 19.3 % — ABNORMAL HIGH (ref 11.5–15.5)
WBC Count: 2.1 10*3/uL — ABNORMAL LOW (ref 4.0–10.5)
nRBC: 0 % (ref 0.0–0.2)

## 2022-09-23 MED ORDER — FULVESTRANT 250 MG/5ML IM SOSY
500.0000 mg | PREFILLED_SYRINGE | Freq: Once | INTRAMUSCULAR | Status: AC
  Administered 2022-09-23: 500 mg via INTRAMUSCULAR
  Filled 2022-09-23: qty 10

## 2022-09-23 MED ORDER — DENOSUMAB 60 MG/ML ~~LOC~~ SOSY
60.0000 mg | PREFILLED_SYRINGE | Freq: Once | SUBCUTANEOUS | Status: AC
  Administered 2022-09-23: 60 mg via SUBCUTANEOUS
  Filled 2022-09-23: qty 1

## 2022-09-23 NOTE — Progress Notes (Signed)
[facility]  Telephone:(336) [phone] Fax:(336) [phone]    ID: Nicole [name]   DOB: Sep 09, 1956  MR#: [id number]  FKC#:[id number]  Patient Care Team: Benay Pike, MD as PCP - General (Hematology and Oncology) Roosevelt Locks, CRNP as Nurse Practitioner (Nurse Practitioner) Rolm Bookbinder, MD as Consulting Physician (General Surgery) Laurence Spates, MD (Inactive) as Consulting Physician (Gastroenterology) [name], MD as Consulting Physician (Gastroenterology) OTHER MD:  CHIEF COMPLAINT:  Metastatic Breast Cancer (s/p right mastectomy)  CURRENT TREATMENT: Letrozole, denosumab/Prolia    INTERVAL HISTORY:  Nicole [name] returns today for follow-up of her estrogen receptor positive stage IV breast cancer.  Since last visit she had left lower lobe lobectomy with lymph node excision which showed carcinoma with mucinous features consistent with metastatic breast carcinoma, negative margin, parenchymal margin negative for carcinoma, immunohistochemical findings consistent with metastatic breast carcinoma ER +100% strong staining PR 20% staining, Ki-67 of 10 % (initially reported 99% but this was confirmed with Dr Lourdes Sledge) HER2 group 5 negative  Since last visit, she has started Ibrance at the end of August. She has tolerated Ibrance very well so far. She also tolerated Faslodex well so far.  No complaints.  She is recovering very well since her surgery.   COVID 19 VACCINATION STATUS: Status post Pfizer x2+ booster August 2021   BREAST CANCER HISTORY: From Dr. Collier [place] Rubin's 07/25/2010 note:  "This woman has not had any significant medical intervention for some time.  Her last mammogram was about seven years ago.  She noted a right breast mass about a year ago and did not seek immediate medical attention for this.  She ultimately, after some time, admitted this to her husband and self-referred herself for intervention.  She had a mammogram on 07/17/2010 with an ultrasound of  the right breast.  This showed a lobulated mass upper right outer quadrant measuring at least 6.3 x 7.3 cm and large right axillary lymph nodes were also noted.  There was skin thickening overlying the mass.  On physical exam, this mass was about an 8 cm with skin dimpling noted. Discolored area in the skin over the mass, fullness in the axilla.  Biopsy was recommended, which took place on 07/17/2010.  Pathology showed invasive ductal cancer involving both lymph node and breast.  The HER-2 was not amplified.  ER and PR both positive at 100% and 6% respectively.  Proliferative index was 12% involving both the lymph node and breast mass.  An MRI scan of both breasts was performed on 07/22/2010, essentially which showed a large heterogeneously enhancing mass of the right breast with washout kinetics measuring 6.4 x 4.4 x 5.0 cm.  Numerous satellite nodules were seen throughout the dominant mass.  There were several suspicious nodules in the upper central and upper inner quadrant of the right breast.  There were two enhancing subcentimeter nodules seen in the right pectoralis muscle.  No obvious chest wall nodules were seen; however, there was seen some metastatic adenopathy in the mediastinum as well as hila with a 2.4 x 2.0 cm mediastinal lymph node as well as 1.2 x 3.0 cm subcarinal lymph node.  There was a right hilar and probable left hilar adenopathy.  Bilateral pulmonary nodules were seen and a right T2 lesion was seen anterior right liver as well."  Her subsequent history is as detailed below.   PAST MEDICAL HISTORY: Past Medical History:  Diagnosis Date   Anemia    low iron   Arthritis    Blood transfusion 2012  Breast CA ([place])    breast ca dx 06/2010, stage 4, right , Lung Metasis.Surgery, Chemo, Radiation   Breast cancer ([place])    Clot    jugular  ?2015   Depression    Esophageal varices ([place])    Headache    Mirgraine- rare since menopause   Heart murmur    mild, no cardiologist, from  birth   Hematemesis 08/2017   History of kidney stones    no pain- shows in CT- small 1 mm bilaterally- kidney   Hypertension    IBD (inflammatory bowel disease)    resolved   Idiopathic cirrhosis ([place])    pt denies   Peripheral vascular disease ([place]) few yrs ago   clot in right jugular   Portal hypertension ([place])    Radiation 07/21/12-09/07/12   5940 cGy   Restless legs     PAST SURGICAL HISTORY: Past Surgical History:  Procedure Laterality Date   BIOPSY  08/21/2020   Procedure: BIOPSY;  Surgeon: [name], MD;  Location: [facility];  Service: Gastroenterology;;   COLONOSCOPY  2016   DILATION AND CURETTAGE OF UTERUS  2004?   ESOPHAGEAL BANDING  09/04/2017   Procedure: ESOPHAGEAL BANDING;  Surgeon: [name], MD;  Location: [facility] ENDOSCOPY;  Service: Gastroenterology;;   ESOPHAGEAL BANDING  11/27/2017   Procedure: ESOPHAGEAL BANDING;  Surgeon: [name], MD;  Location: [place];  Service: Gastroenterology;;   ESOPHAGEAL BANDING N/A 04/28/2018   Procedure: ESOPHAGEAL BANDING;  Surgeon: [name], MD;  [facility];  Service: Gastroenterology;  Laterality: N/A;   ESOPHAGEAL BANDING  11/17/2018   Procedure: ESOPHAGEAL BANDING;  Surgeon: [name], MD;  Location: [facility];  Service: Gastroenterology;;   ESOPHAGEAL BANDING  08/16/2019   Procedure: ESOPHAGEAL BANDING;  Surgeon: [name], MD;  Location: [facility];  Service: Gastroenterology;;   ESOPHAGEAL BANDING N/A 08/21/2020   Procedure: ESOPHAGEAL BANDING;  Surgeon: [name], MD;  [facility];  Service: Gastroenterology;  Laterality: N/A;   ESOPHAGOGASTRODUODENOSCOPY N/A 04/28/2018   Procedure: ESOPHAGOGASTRODUODENOSCOPY (EGD);  Surgeon: [name], MD;  Location: [facility];  Service: Gastroenterology;  Laterality: N/A;   ESOPHAGOGASTRODUODENOSCOPY N/A 11/17/2018   Procedure: ESOPHAGOGASTRODUODENOSCOPY (EGD);  Surgeon: [name], MD;  Location: [facility];  Service: Gastroenterology;   Laterality: N/A;   ESOPHAGOGASTRODUODENOSCOPY N/A 08/13/2022   Procedure: ESOPHAGOGASTRODUODENOSCOPY (EGD);  Surgeon: [name], MD;  Location: [facility];  Service: Gastroenterology;  Laterality: N/A;   ESOPHAGOGASTRODUODENOSCOPY (EGD) WITH PROPOFOL N/A 04/11/2016   Procedure: ESOPHAGOGASTRODUODENOSCOPY (EGD) WITH PROPOFOL;  Surgeon: Clarene Essex, MD;  [facility];  Service: Endoscopy;  Laterality: N/A;   ESOPHAGOGASTRODUODENOSCOPY (EGD) WITH PROPOFOL N/A 07/08/2016   Procedure: ESOPHAGOGASTRODUODENOSCOPY (EGD) WITH PROPOFOL;  Surgeon: Laurence Spates, MD;  Location: [facility];  Service: Endoscopy;  Laterality: N/A;   ESOPHAGOGASTRODUODENOSCOPY (EGD) WITH PROPOFOL N/A 03/03/2017   Procedure: ESOPHAGOGASTRODUODENOSCOPY (EGD) WITH PROPOFOL;  Surgeon: Laurence Spates, MD;  Location: [place];  Service: Endoscopy;  Laterality: N/A;   ESOPHAGOGASTRODUODENOSCOPY (EGD) WITH PROPOFOL N/A 09/04/2017   Procedure: ESOPHAGOGASTRODUODENOSCOPY (EGD) WITH PROPOFOL;  Surgeon: [name], MD;  Location: [place];  Service: Gastroenterology;  Laterality: N/A;   ESOPHAGOGASTRODUODENOSCOPY (EGD) WITH PROPOFOL N/A 11/27/2017   Procedure: ESOPHAGOGASTRODUODENOSCOPY (EGD);  Surgeon: [name], MD;  Location: [place];  Service: Gastroenterology;  Laterality: N/A;   ESOPHAGOGASTRODUODENOSCOPY (EGD) WITH PROPOFOL N/A 08/16/2019   Procedure: ESOPHAGOGASTRODUODENOSCOPY (EGD) WITH PROPOFOL;  Surgeon: [name], MD;  [facility];  Service: Gastroenterology;  Laterality: N/A;   ESOPHAGOGASTRODUODENOSCOPY (EGD) WITH PROPOFOL N/A  08/21/2020   Procedure: ESOPHAGOGASTRODUODENOSCOPY (EGD) WITH PROPOFOL;  Surgeon: [name], MD;  [facility];  Service: Gastroenterology;  Laterality: N/A;   ESOPHAGOGASTRODUODENOSCOPY (EGD) WITH PROPOFOL N/A 08/24/2021   Procedure: ESOPHAGOGASTRODUODENOSCOPY (EGD) WITH PROPOFOL;  Surgeon: [name], MD;  [facility];  Service: Gastroenterology;   Laterality: N/A;   GASTRIC VARICES BANDING N/A 08/24/2021   Procedure: GASTRIC VARICES BANDING;  Surgeon: [name], MD;  [facility];  Service: Gastroenterology;  Laterality: N/A;   GASTRIC VARICES BANDING N/A 08/13/2022   Procedure: GASTRIC VARICES BANDING;  Surgeon: [name], MD;  [facility];  Service: Gastroenterology;  Laterality: N/A;   HEMOSTASIS CLIP PLACEMENT  08/21/2020   Procedure: HEMOSTASIS CLIP PLACEMENT;  Surgeon: [name], MD;  [facility];  Service: Gastroenterology;;   INTERCOSTAL NERVE BLOCK Left 07/01/2022   Procedure: INTERCOSTAL NERVE BLOCK;  Surgeon: [name], MD;  Location: [place];  Service: Thoracic;  Laterality: Left;   IR GENERIC HISTORICAL  05/15/2016   IR RADIOLOGIST EVAL & MGMT 05/15/2016 Corrie Mckusick, DO [facility] INTER[facility] RAD   IR GENERIC HISTORICAL  01/14/2017   IR US GUIDE VASC ACCESS RIGHT 01/14/2017 Greggory Keen, MD [facility]   IR GENERIC HISTORICAL  01/14/2017   IR VENOGRAM HEPATIC W HEMODYNAMIC EVALUATION 01/14/2017 Greggory Keen, MD [facility]   IR RADIOLOGIST EVAL & MGMT  06/12/2017   IR RADIOLOGIST EVAL & MGMT  08/11/2018   IR RADIOLOGIST EVAL & MGMT  08/25/2019   IR RADIOLOGIST EVAL & MGMT  08/22/2020   IR RADIOLOGIST EVAL & MGMT  03/11/2022   MASTECTOMY Right 06/02/12   right breast with lymph node removal   NODE DISSECTION Left 07/01/2022   Procedure: NODE DISSECTION;  Surgeon: [name], MD;  [facility];  Service: Thoracic;  Laterality: Left;   PORT-A-CATH REMOVAL     PORTACATH PLACEMENT  2011   left side   RADIOLOGY WITH ANESTHESIA N/A 04/13/2016   Procedure: RADIOLOGY WITH ANESTHESIA;  Surgeon: Corrie Mckusick, DO;  Location: [place];  Service: Anesthesiology;  Laterality: N/A;   VIDEO BRONCHOSCOPY WITH ENDOBRONCHIAL NAVIGATION N/A 07/01/2022   Procedure: VIDEO BRONCHOSCOPY WITH ENDOBRONCHIAL NAVIGATION;  Surgeon: [name], MD;  [facility];  Service: Thoracic;  Laterality: N/A;    XI ROBOTIC ASSISTED THORACOSCOPY- SEGMENTECTOMY Left 07/01/2022   Procedure: XI ROBOTIC ASSISTED THORACOSCOPY-LEFT LOWER LOBE BASILAR SEGMENTECTOMY;  Surgeon: [name], MD;  [facility];  Service: Thoracic;  Laterality: Left;    FAMILY HISTORY Family History  Problem Relation Age of Onset   COPD Mother    AAA (abdominal aortic aneurysm) Mother    COPD Father    Cancer Neg Hx   The patient's father died at the age of 64 from heart disease. Patient's mother died at the age of 42 status post CABG. The patient had no brothers, 2 sisters. There is no history of breast or ovarian cancer in the family.   GYNECOLOGIC HISTORY: Updated May 2018 Menarche age 68, she is GX P0. She does not recall when she went through menopause. She never took hormone replacement.   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 Husband Merry Proud is retired from [employer]. They have no children and no pets. They attend a local [place].    ADVANCED DIRECTIVES: Not in place   HEALTH MAINTENANCE: Social History   Tobacco Use  Smoking status: Never   Smokeless tobacco: Never  Vaping Use   Vaping Use: Never used  Substance Use Topics   Alcohol use: No   Drug use: No     Colonoscopy: Never  PAP: Remote  Bone density: 09/27/2013, osteoporosis with a T score of -2.8  Lipid panel: Not on file    No Active Allergies   Current Outpatient Medications  Medication Sig Dispense Refill   denosumab (PROLIA) 60 MG/ML SOLN injection Inject 60 mg into the skin every 6 (six) months. Administer in upper arm, thigh, or abdomen     ELIQUIS 5 MG TABS tablet TAKE 1 TABLET BY MOUTH TWICE A DAY 60 tablet 2   folic acid (FOLVITE) 1 MG tablet Take 1 mg by mouth daily.     Milk Thistle 1000 MG CAPS Take 1,000 mg by mouth daily.     palbociclib (IBRANCE) 125 MG tablet Take 1 tablet (125 mg total) by  mouth daily. Take for 21 days on, 7 days off, repeat every 28 days. 21 tablet 3   pantoprazole (PROTONIX) 40 MG tablet Take 1 tablet (40 mg total) by mouth daily. 60 tablet 0   propranolol (INDERAL) 20 MG tablet Take 1 tablet (20 mg total) by mouth 2 (two) times daily. 60 tablet 0   venlafaxine XR (EFFEXOR-XR) 150 MG 24 hr capsule TAKE 1 CAPSULE BY MOUTH DAILY WITH BREAKFAST. 90 capsule 1   No current facility-administered medications for this visit.   Facility-Administered Medications Ordered in Other Visits  Medication Dose Route Frequency Provider Last Rate Last Admin   denosumab (PROLIA) injection 60 mg  60 mg Subcutaneous Once Magrinat, Virgie Dad, MD        OBJECTIVE: white woman who appears younger than stated age  66:   09/23/22 1416  BP: 122/72  Pulse: 82  Resp: 18  Temp: 97.7 F (36.5 C)  TempSrc: Tympanic  SpO2: 100%  Weight: 131 lb 8 oz (59.6 kg)  Height: _0  (1.626 m)     Physical Exam Constitutional:      Appearance: Normal appearance.  Cardiovascular:     Rate and Rhythm: Normal rate and regular rhythm.  Pulmonary:     Effort: Pulmonary effort is normal.     Breath sounds: Normal breath sounds.  Abdominal:     General: Abdomen is flat.     Palpations: Abdomen is soft.  Musculoskeletal:     Cervical back: Normal range of motion and neck supple. No rigidity.  Lymphadenopathy:     Cervical: No cervical adenopathy.  Skin:    General: Skin is warm and dry.  Neurological:     General: No focal deficit present.     Mental Status: She is alert.  Psychiatric:        Mood and Affect: Mood normal.    LAB RESULTS:  CMP     Component Value Date/Time   NA 139 09/23/2022 1327   NA 142 11/17/2017 1355   K 4.0 09/23/2022 1327   K 4.4 11/17/2017 1355   CL 110 09/23/2022 1327   CL 109 (H) 04/20/2013 0859   CO2 22 09/23/2022 1327   CO2 22 11/17/2017 1355   GLUCOSE 94 09/23/2022 1327   GLUCOSE 86 11/17/2017 1355   GLUCOSE 91 04/20/2013 0859   BUN 18  09/23/2022 1327   BUN 12.5 11/17/2017 1355   CREATININE 1.07 (H) 09/23/2022 1327   CREATININE 1.1 11/17/2017 1355   CALCIUM 8.4 (L) 09/23/2022 1327   CALCIUM 9.2  11/17/2017 1355   PROT 6.7 09/23/2022 1327   PROT 7.0 11/17/2017 1355   ALBUMIN 4.0 09/23/2022 1327   ALBUMIN 3.8 11/17/2017 1355   AST 14 (L) 09/23/2022 1327   AST 23 11/17/2017 1355   ALT 11 09/23/2022 1327   ALT 22 11/17/2017 1355   ALKPHOS 54 09/23/2022 1327   ALKPHOS 55 11/17/2017 1355   BILITOT 0.7 09/23/2022 1327   BILITOT 0.49 11/17/2017 1355   GFRNONAA 57 (L) 09/23/2022 1327   GFRNONAA 53 (L) 05/09/2016 1104   GFRAA 53 (L) 06/12/2020 1146   GFRAA 55 (L) 12/13/2019 1200   GFRAA 61 05/09/2016 1104    No results found for: "TOTALPROTELP", "ALBUMINELP", "A1GS", "A2GS", "BETS", "BETA2SER", "GAMS", "MSPIKE", "SPEI"  Lab Results  Component Value Date   WBC 2.1 (L) 09/23/2022   NEUTROABS 1.4 (L) 09/23/2022   HGB 11.0 (L) 09/23/2022   HCT 34.7 (L) 09/23/2022   MCV 79.6 (L) 09/23/2022   PLT 72 (L) 09/23/2022    Lab Results  Component Value Date   LABCA2 18 08/21/2012    No components found for: "XBWIOM355"  No results for input(s): "INR" in the last 168 hours.  Lab Results  Component Value Date   LABCA2 18 08/21/2012    No results found for: "HRC163"  No results found for: "CAN125"  No results found for: "AGT364"  Lab Results  Component Value Date   CA2729 26.9 12/13/2019    No components found for: "HGQUANT"  No results found for: "CEA1", "CEA" / No results found for: "CEA1", "CEA"   No results found for: "AFPTUMOR"  No results found for: "CHROMOGRNA"  No results found for: "KPAFRELGTCHN", "LAMBDASER", "KAPLAMBRATIO" (kappa/lambda light chains)  No results found for: "HGBA", "HGBA2QUANT", "HGBFQUANT", "HGBSQUAN" (Hemoglobinopathy evaluation)   Lab Results  Component Value Date   LDH 195 08/21/2012    Lab Results  Component Value Date   IRON 27 (L) 04/11/2016   TIBC 340  04/11/2016   IRONPCTSAT 8 (L) 04/11/2016   (Iron and TIBC)  Lab Results  Component Value Date   FERRITIN 18 04/11/2016    Urinalysis    Component Value Date/Time   COLORURINE YELLOW 06/28/2022 1122   APPEARANCEUR CLEAR 06/28/2022 1122   LABSPEC 1.015 06/28/2022 1122   LABSPEC 1.010 04/29/2016 1118   PHURINE 6.0 06/28/2022 1122   GLUCOSEU NEGATIVE 06/28/2022 1122   GLUCOSEU Negative 04/29/2016 1118   HGBUR NEGATIVE 06/28/2022 1122   BILIRUBINUR NEGATIVE 06/28/2022 1122   BILIRUBINUR Negative 04/29/2016 1118   KETONESUR NEGATIVE 06/28/2022 1122   PROTEINUR NEGATIVE 06/28/2022 1122   UROBILINOGEN 0.2 04/29/2016 1118   NITRITE NEGATIVE 06/28/2022 1122   LEUKOCYTESUR NEGATIVE 06/28/2022 1122   LEUKOCYTESUR Trace 04/29/2016 1118    STUDIES: No results found.  COMPARISON: Abdominal ultrasound November 05, 2021   TECHNIQUE:  Real time limited abdomen ultrasound was performed.   IMAGE QUALITY: Satisfactory, of diagnostic value.   FINDINGS:   Limited sonographic views of the abdomen demonstrates mild abdominal ascites. There is no large pocket to safely drain.    IMPRESSION:  IMPRESSION:  1. Mild abdominal ascites.  2. No large enough pocket to safely drain.    ASSESSMENT: 66 y.o. [place] woman presenting June 2011 with stage IV breast cancer involving the right breast upper outer quadrant, right axilla, mediastinal lymph nodes, and both lungs, but not the brain, liver, or bones  (1) positive right breast and right axillary lymph node biopsies 07/17/2010 of a clinical T4 N2 M1 invasive ductal carcinoma,  grade 1, estrogen receptor 100% positive, progesterone receptor 6% positive, with an MIB-1 of 12%, and no HER-2 amplification.  (2) participated in Phase II tessetaxel study, receiving 2 cycles complicated by thrombocytopenia, anemia requiring transfusion, transaminase elevation, and afebrile neutropenia. Off-study as of September 2011. Chest CT scan September 2011 did  show evidence of response.  (3) on letrozole as of October 2011, with continuing response and good tolerance   (4) 06/02/2012 underwent right mastectomy and axillary lymph node sampling (3 lymph nodes removed, all with viable cancer as well as evidence of treatment effect) for a ypT2 ypN1-2 invasive ductal carcinoma, grade 1, 98% estrogen receptor positive, progesterone receptor negative, with no HER-2 amplification  (5) left jugular vein DVT documented 06/16/2012; left sided Port-A-Cath removed mid July 2015; Coumadin stopped 09/07/2014.  (a) Left brachiocephalic v. slightly narrowed, with some Left lower neck collateralization noted on chest CT December 2015  (6) osteoporosis, was on alendronate, switched to denosumab/Prolia 11/07/2016  (a) bone density in 10/2018 shows osteopenia T score of -2.2  (7) portal hypertension, with splenomegaly, gastric varices, and intestinal edema  (a)  mild persistent leukopenia and thrombocytopenia likely due to splenomegaly  (b) iron deficiency secondary to above, status post Feraheme x2 December 2018  (8) restaging studies:  (a) chest CT 12/26/2017 shows a 4.5 mm left lower lobe pulmonary nodule which is unchanged.  There was no other evidence of active disease  (b) restaging chest CT in 05/2019 shows no change in bilateral pulmonary nodules, prominent mediastinal lymph nodes are stable  (9) CT scan of the abdomen 11/03/2021 shows thrombus in the portal, splenic and superior mesenteric vein  (A) Heparin started 11/03/2021  10. PET scan 7/82/4235, low metabolic activity of right lower pulmonary nodule. No change in size of CT 02/27/2022, Carcinoma with mucinous features consistent with metastatic breast carcinoma, negative margins, no LN involvement.  Prognostics showed ER +100% strong staining, PR +20% staining, Ki-67 of 10% and HER2 2+ by IHC and negative by FISH  PLAN:  Given metastatic breast cancer progression on letrozole, we discussed about adding  Faslodex with Ibrance.  She is currently on this combination and is tolerating it very well.   No concerns today.  We will check back on the foundation 1 testing results. Physical examination today without any concerns.  She will proceed with Faslodex as scheduled today. She will return to clinic in 4 weeks with repeat labs and for Faslodex injection.  We will plan to repeat imaging in about 3 to 4 months. Total encounter time 30 minutes.   *Total Encounter Time as defined by the Centers for Medicare and Medicaid Services includes, in addition to the face-to-face time of a patient visit (documented in the note above) non-face-to-face time: obtaining and reviewing outside history, ordering and reviewing medications, tests or procedures, care coordination (communications with other health care professionals or caregivers) and documentation in the medical record.

## 2022-09-24 ENCOUNTER — Telehealth: Payer: Self-pay | Admitting: *Deleted

## 2022-09-24 ENCOUNTER — Other Ambulatory Visit: Payer: Self-pay | Admitting: Hematology and Oncology

## 2022-09-24 ENCOUNTER — Ambulatory Visit: Payer: Self-pay | Admitting: Thoracic Surgery (Cardiothoracic Vascular Surgery)

## 2022-09-24 DIAGNOSIS — C50911 Malignant neoplasm of unspecified site of right female breast: Secondary | ICD-10-CM

## 2022-09-24 NOTE — Telephone Encounter (Signed)
Foundation 1 with PDL-1 request faxed with confirmation of receipt.

## 2022-10-01 ENCOUNTER — Ambulatory Visit
Admission: RE | Admit: 2022-10-01 | Discharge: 2022-10-01 | Disposition: A | Discharge status: Home / Self Care | Payer: Medicare Other | Source: Ambulatory Visit | Attending: Thoracic Surgery (Cardiothoracic Vascular Surgery) | Admitting: Thoracic Surgery (Cardiothoracic Vascular Surgery)

## 2022-10-01 ENCOUNTER — Ambulatory Visit (INDEPENDENT_AMBULATORY_CARE_PROVIDER_SITE_OTHER): Payer: Self-pay | Admitting: Thoracic Surgery (Cardiothoracic Vascular Surgery)

## 2022-10-01 VITALS — BP 99/66 | HR 95 | Resp 20 | Ht 64.0 in | Wt 130.0 lb

## 2022-10-01 DIAGNOSIS — R911 Solitary pulmonary nodule: Secondary | ICD-10-CM

## 2022-10-01 DIAGNOSIS — Z09 Encounter for follow-up examination after completed treatment for conditions other than malignant neoplasm: Secondary | ICD-10-CM

## 2022-10-01 NOTE — Progress Notes (Signed)
[address]       [place] [address]             [phone]     HPI: Nicole [name] returns for a scheduled follow-up visit after left lower lobe basilar segmentectomy  Nicole [name] is a 66 year old woman with a history of stage IV breast cancer first diagnosed in 2011, idiopathic cirrhosis, portal vein thrombosis, portal hypertension, esophageal varices, hypertension, DVT, arthritis, heart murmur, and lung nodule.  She been found to have a left lower lobe lung nodule about 5 years ago.  Over time there was minimal change but recently had shown slight growth.  Very mild activity on PET.  I did a left lower lobe basilar segmentectomy on 07/01/2022.  I saw her in the office on 08/06/2022.  She was doing well at that time.  She was not having any pain.  She had a small loculated effusion on the left, so she now returns with a follow-up chest x-ray to check on that.  In the interim since her last visit she has continued to feel well.  She denies pain or shortness of breath.  She was started on Ibrance and then also receives injections with Faslodex.  Past Medical History:  Diagnosis Date   Anemia    low iron   Arthritis    Blood transfusion 2012   Breast CA ([place])    breast ca dx 06/2010, stage 4, right , Lung Metasis.Surgery, Chemo, Radiation   Breast cancer ([place])    Clot    jugular  ?2015   Depression    Esophageal varices ([place])    Headache    Mirgraine- rare since menopause   Heart murmur    mild, no cardiologist, from birth   Hematemesis 08/2017   History of kidney stones    no pain- shows in CT- small 1 mm bilaterally- kidney   Hypertension    IBD (inflammatory bowel disease)    resolved   Idiopathic cirrhosis ([place])    pt denies   Peripheral vascular disease ([place]) few yrs ago   clot in right jugular   Portal hypertension ([place])    Radiation 07/21/12-09/07/12   5940 cGy   Restless legs     Current Outpatient Medications  Medication Sig Dispense Refill    denosumab (PROLIA) 60 MG/ML SOLN injection Inject 60 mg into the skin every 6 (six) months. Administer in upper arm, thigh, or abdomen     ELIQUIS 5 MG TABS tablet TAKE 1 TABLET BY MOUTH TWICE A DAY 60 tablet 2   folic acid (FOLVITE) 1 MG tablet Take 1 mg by mouth daily.     Milk Thistle 1000 MG CAPS Take 1,000 mg by mouth daily.     palbociclib (IBRANCE) 125 MG tablet Take 1 tablet (125 mg total) by mouth daily. Take for 21 days on, 7 days off, repeat every 28 days. 21 tablet 3   propranolol (INDERAL) 20 MG tablet Take 1 tablet (20 mg total) by mouth 2 (two) times daily. 60 tablet 0   venlafaxine XR (EFFEXOR-XR) 150 MG 24 hr capsule TAKE 1 CAPSULE BY MOUTH DAILY WITH BREAKFAST. 90 capsule 1   pantoprazole (PROTONIX) 40 MG tablet Take 1 tablet (40 mg total) by mouth daily. 60 tablet 0   No current facility-administered medications for this visit.   Facility-Administered Medications Ordered in Other Visits  Medication Dose Route Frequency Provider Last Rate Last Admin   denosumab (PROLIA) injection 60 mg  60 mg Subcutaneous Once Magrinat, Virgie Dad, MD        Physical Exam BP 99/66   Pulse 95   Resp 20   Ht '5\' 4"'$  (1.626 m)   Wt 130 lb (59 kg)   SpO2 98% Comment: RA  BMI 22.31 kg/m  Well-appearing 66 year old woman in no acute distress Alert and oriented x3 with no focal deficits Lungs slightly diminished at left base but otherwise clear Incisions well-healed  Diagnostic Tests: CHEST - 2 VIEW   COMPARISON:  Chest x-ray dated August 06, 2022   FINDINGS: Visualized cardiac and mediastinal contours within normal limits. Left-greater-than-right biapical pleural-parenchymal scarring. Stable moderate left and small right pleural effusions. Postsurgical changes of the right lung.   IMPRESSION: Stable moderate left and small right pleural effusions.     Electronically Signed   By: Yetta Glassman M.D.   On: 10/01/2022 16:04 I personally reviewed the chest x-ray.  No change from  previous film.  There is some loculated of effusion on the left.  Impression: Nicole [name] is a 66 year old woman with complicated medical history including breast cancer.  I did a left lower lobe basilar segmentectomy for a very slow-growing lesion which turned out to be metastatic breast cancer.  She has seen Dr. Chryl Heck and has been started on Ibrance and Faslodex which she is tolerating well so far.  From a surgical standpoint she is doing great.  She does not have any incisional pain.  No respiratory symptoms.  She does have some loculated fluid which is basically occupying the space of the basilar segments that were resected.  There are no restrictions on her from a surgical standpoint.  Plan: I will be happy to see Nicole [name] back anytime in the future if I can be of any further assistance with her care.  [name], MD [facility] [phone]

## 2022-10-02 ENCOUNTER — Encounter (HOSPITAL_COMMUNITY): Payer: Self-pay

## 2022-10-02 ENCOUNTER — Inpatient Hospital Stay: Payer: Medicare Other | Attending: Hematology and Oncology

## 2022-10-02 ENCOUNTER — Telehealth: Payer: Self-pay | Admitting: *Deleted

## 2022-10-02 ENCOUNTER — Other Ambulatory Visit (HOSPITAL_COMMUNITY): Payer: Self-pay

## 2022-10-02 ENCOUNTER — Other Ambulatory Visit: Payer: Self-pay

## 2022-10-02 DIAGNOSIS — C50411 Malignant neoplasm of upper-outer quadrant of right female breast: Secondary | ICD-10-CM | POA: Insufficient documentation

## 2022-10-02 DIAGNOSIS — Z5111 Encounter for antineoplastic chemotherapy: Secondary | ICD-10-CM | POA: Insufficient documentation

## 2022-10-02 DIAGNOSIS — C778 Secondary and unspecified malignant neoplasm of lymph nodes of multiple regions: Secondary | ICD-10-CM | POA: Insufficient documentation

## 2022-10-02 DIAGNOSIS — C7802 Secondary malignant neoplasm of left lung: Secondary | ICD-10-CM | POA: Insufficient documentation

## 2022-10-02 DIAGNOSIS — Z170 Estrogen receptor positive status [ER+]: Secondary | ICD-10-CM | POA: Diagnosis not present

## 2022-10-02 DIAGNOSIS — C7801 Secondary malignant neoplasm of right lung: Secondary | ICD-10-CM | POA: Diagnosis not present

## 2022-10-02 DIAGNOSIS — C50911 Malignant neoplasm of unspecified site of right female breast: Secondary | ICD-10-CM

## 2022-10-02 LAB — CBC WITH DIFFERENTIAL/PLATELET
Abs Immature Granulocytes: 0 10*3/uL (ref 0.00–0.07)
Basophils Absolute: 0 10*3/uL (ref 0.0–0.1)
Basophils Relative: 2 %
Eosinophils Absolute: 0 10*3/uL (ref 0.0–0.5)
Eosinophils Relative: 3 %
HCT: 31.4 % — ABNORMAL LOW (ref 36.0–46.0)
Hemoglobin: 10 g/dL — ABNORMAL LOW (ref 12.0–15.0)
Immature Granulocytes: 0 %
Lymphocytes Relative: 30 %
Lymphs Abs: 0.3 10*3/uL — ABNORMAL LOW (ref 0.7–4.0)
MCH: 26 pg (ref 26.0–34.0)
MCHC: 31.8 g/dL (ref 30.0–36.0)
MCV: 81.8 fL (ref 80.0–100.0)
Monocytes Absolute: 0.3 10*3/uL (ref 0.1–1.0)
Monocytes Relative: 28 %
Neutro Abs: 0.4 10*3/uL — CL (ref 1.7–7.7)
Neutrophils Relative %: 37 %
Platelets: 67 10*3/uL — ABNORMAL LOW (ref 150–400)
RBC: 3.84 MIL/uL — ABNORMAL LOW (ref 3.87–5.11)
RDW: 24.9 % — ABNORMAL HIGH (ref 11.5–15.5)
WBC: 1.2 10*3/uL — ABNORMAL LOW (ref 4.0–10.5)
nRBC: 0 % (ref 0.0–0.2)

## 2022-10-02 MED ORDER — PALBOCICLIB 100 MG PO CAPS
100.0000 mg | ORAL_CAPSULE | Freq: Every day | ORAL | 1 refills | Status: DC
  Filled 2022-10-02: qty 21, 21d supply, fill #0

## 2022-10-02 NOTE — Telephone Encounter (Signed)
This RN spoke with pt per labs today with ANC of 0.4.  Informed her per MD recommendation she needs to hold restart of Ibrance tomorrow. Reviewed neutropenic precautions with pt verbalizing understanding.  Appt made for 1pm 10/11 for recheck to restart the Ibrance at lower dose.  New prescription for Ibrance 100 mg sent to her pharmacy.

## 2022-10-04 ENCOUNTER — Other Ambulatory Visit: Payer: Self-pay | Admitting: *Deleted

## 2022-10-04 MED ORDER — PALBOCICLIB 100 MG PO CAPS: 100.0000 mg | ORAL_CAPSULE | Freq: Every day | ORAL | 1 refills | Status: DC

## 2022-10-08 ENCOUNTER — Encounter (HOSPITAL_COMMUNITY): Payer: Self-pay | Admitting: Hematology and Oncology

## 2022-10-09 ENCOUNTER — Other Ambulatory Visit: Payer: Self-pay | Admitting: *Deleted

## 2022-10-09 ENCOUNTER — Inpatient Hospital Stay: Payer: Medicare Other

## 2022-10-09 DIAGNOSIS — C50911 Malignant neoplasm of unspecified site of right female breast: Secondary | ICD-10-CM

## 2022-10-09 DIAGNOSIS — Z5111 Encounter for antineoplastic chemotherapy: Secondary | ICD-10-CM | POA: Diagnosis not present

## 2022-10-09 LAB — CBC WITH DIFFERENTIAL (CANCER CENTER ONLY)
Abs Immature Granulocytes: 0.05 10*3/uL (ref 0.00–0.07)
Basophils Absolute: 0.1 10*3/uL (ref 0.0–0.1)
Basophils Relative: 3 %
Eosinophils Absolute: 0.1 10*3/uL (ref 0.0–0.5)
Eosinophils Relative: 3 %
HCT: 34.1 % — ABNORMAL LOW (ref 36.0–46.0)
Hemoglobin: 11.1 g/dL — ABNORMAL LOW (ref 12.0–15.0)
Immature Granulocytes: 2 %
Lymphocytes Relative: 22 %
Lymphs Abs: 0.7 10*3/uL (ref 0.7–4.0)
MCH: 26.2 pg (ref 26.0–34.0)
MCHC: 32.6 g/dL (ref 30.0–36.0)
MCV: 80.4 fL (ref 80.0–100.0)
Monocytes Absolute: 0.7 10*3/uL (ref 0.1–1.0)
Monocytes Relative: 22 %
Neutro Abs: 1.6 10*3/uL — ABNORMAL LOW (ref 1.7–7.7)
Neutrophils Relative %: 48 %
Platelet Count: 188 10*3/uL (ref 150–400)
RBC: 4.24 MIL/uL (ref 3.87–5.11)
RDW: 25.1 % — ABNORMAL HIGH (ref 11.5–15.5)
WBC Count: 3.2 10*3/uL — ABNORMAL LOW (ref 4.0–10.5)
nRBC: 0 % (ref 0.0–0.2)

## 2022-10-15 ENCOUNTER — Other Ambulatory Visit: Payer: Self-pay | Admitting: Hematology and Oncology

## 2022-10-15 DIAGNOSIS — Z1231 Encounter for screening mammogram for malignant neoplasm of breast: Secondary | ICD-10-CM

## 2022-10-16 ENCOUNTER — Encounter: Payer: Self-pay | Admitting: Hematology and Oncology

## 2022-10-16 NOTE — Telephone Encounter (Signed)
This RN spoke with pt per her obtaining the new dose of Ibrance 100 mg dose.  She will start medication tomorrow and take it daily for 21 days.  This RN sent scheduling request for appt with lab and md for week of 11/13 ( off week of therapy)-  MD appointment cancelled for 10/24- injection appointment maintained.

## 2022-10-22 ENCOUNTER — Inpatient Hospital Stay: Payer: Medicare Other

## 2022-10-22 ENCOUNTER — Ambulatory Visit: Payer: Medicare Other | Admitting: Hematology and Oncology

## 2022-10-22 VITALS — BP 130/77 | HR 84 | Temp 98.0°F | Resp 18

## 2022-10-22 DIAGNOSIS — M810 Age-related osteoporosis without current pathological fracture: Secondary | ICD-10-CM

## 2022-10-22 DIAGNOSIS — Z5111 Encounter for antineoplastic chemotherapy: Secondary | ICD-10-CM | POA: Diagnosis not present

## 2022-10-22 MED ORDER — FULVESTRANT 250 MG/5ML IM SOSY
500.0000 mg | PREFILLED_SYRINGE | Freq: Once | INTRAMUSCULAR | Status: AC
  Administered 2022-10-22: 500 mg via INTRAMUSCULAR
  Filled 2022-10-22: qty 10

## 2022-10-29 ENCOUNTER — Telehealth: Payer: Self-pay | Admitting: Pharmacy Technician

## 2022-10-29 NOTE — Telephone Encounter (Signed)
Oral Oncology Patient Advocate Encounter   Received notification that patient is due for re-enrollment for assistance for Ibrance through [employer].   Re-enrollment process has been initiated and will be submitted upon completion of necessary documents.  [employer] phone number [phone].   I will continue to follow until final determination.  Nicole [name], CPhT-Adv Oncology Pharmacy Patient Advocate [facility] Direct Number: [phone]  Fax: [phone]   

## 2022-10-31 ENCOUNTER — Inpatient Hospital Stay: Payer: Medicare Other | Attending: Hematology and Oncology | Admitting: Hematology and Oncology

## 2022-10-31 ENCOUNTER — Inpatient Hospital Stay: Payer: Medicare Other

## 2022-10-31 ENCOUNTER — Encounter: Payer: Self-pay | Admitting: Hematology and Oncology

## 2022-10-31 VITALS — BP 129/75 | HR 83 | Temp 97.7°F | Resp 15 | Wt 133.2 lb

## 2022-10-31 DIAGNOSIS — Z170 Estrogen receptor positive status [ER+]: Secondary | ICD-10-CM | POA: Insufficient documentation

## 2022-10-31 DIAGNOSIS — D6959 Other secondary thrombocytopenia: Secondary | ICD-10-CM | POA: Diagnosis not present

## 2022-10-31 DIAGNOSIS — C50411 Malignant neoplasm of upper-outer quadrant of right female breast: Secondary | ICD-10-CM | POA: Insufficient documentation

## 2022-10-31 DIAGNOSIS — I10 Essential (primary) hypertension: Secondary | ICD-10-CM | POA: Insufficient documentation

## 2022-10-31 DIAGNOSIS — J90 Pleural effusion, not elsewhere classified: Secondary | ICD-10-CM | POA: Insufficient documentation

## 2022-10-31 DIAGNOSIS — C7802 Secondary malignant neoplasm of left lung: Secondary | ICD-10-CM | POA: Diagnosis not present

## 2022-10-31 DIAGNOSIS — C7800 Secondary malignant neoplasm of unspecified lung: Secondary | ICD-10-CM

## 2022-10-31 DIAGNOSIS — C7801 Secondary malignant neoplasm of right lung: Secondary | ICD-10-CM | POA: Diagnosis not present

## 2022-10-31 DIAGNOSIS — D72819 Decreased white blood cell count, unspecified: Secondary | ICD-10-CM | POA: Diagnosis not present

## 2022-10-31 DIAGNOSIS — K580 Irritable bowel syndrome with diarrhea: Secondary | ICD-10-CM | POA: Diagnosis not present

## 2022-10-31 DIAGNOSIS — R188 Other ascites: Secondary | ICD-10-CM | POA: Diagnosis not present

## 2022-10-31 DIAGNOSIS — Z79811 Long term (current) use of aromatase inhibitors: Secondary | ICD-10-CM | POA: Insufficient documentation

## 2022-10-31 DIAGNOSIS — C778 Secondary and unspecified malignant neoplasm of lymph nodes of multiple regions: Secondary | ICD-10-CM | POA: Insufficient documentation

## 2022-10-31 DIAGNOSIS — M810 Age-related osteoporosis without current pathological fracture: Secondary | ICD-10-CM | POA: Insufficient documentation

## 2022-10-31 DIAGNOSIS — C50911 Malignant neoplasm of unspecified site of right female breast: Secondary | ICD-10-CM

## 2022-10-31 LAB — CBC WITH DIFFERENTIAL/PLATELET
Abs Immature Granulocytes: 0.02 10*3/uL (ref 0.00–0.07)
Basophils Absolute: 0 10*3/uL (ref 0.0–0.1)
Basophils Relative: 1 %
Eosinophils Absolute: 0.2 10*3/uL (ref 0.0–0.5)
Eosinophils Relative: 7 %
HCT: 34.2 % — ABNORMAL LOW (ref 36.0–46.0)
Hemoglobin: 11 g/dL — ABNORMAL LOW (ref 12.0–15.0)
Immature Granulocytes: 1 %
Lymphocytes Relative: 21 %
Lymphs Abs: 0.6 10*3/uL — ABNORMAL LOW (ref 0.7–4.0)
MCH: 27.1 pg (ref 26.0–34.0)
MCHC: 32.2 g/dL (ref 30.0–36.0)
MCV: 84.2 fL (ref 80.0–100.0)
Monocytes Absolute: 0.2 10*3/uL (ref 0.1–1.0)
Monocytes Relative: 6 %
Neutro Abs: 1.8 10*3/uL (ref 1.7–7.7)
Neutrophils Relative %: 64 %
Platelets: 115 10*3/uL — ABNORMAL LOW (ref 150–400)
RBC: 4.06 MIL/uL (ref 3.87–5.11)
RDW: 26.4 % — ABNORMAL HIGH (ref 11.5–15.5)
WBC: 2.7 10*3/uL — ABNORMAL LOW (ref 4.0–10.5)
nRBC: 0 % (ref 0.0–0.2)

## 2022-10-31 NOTE — Progress Notes (Signed)
Nicole [name]  [phone] [phone]    ID: Nicole [name]   DOB: Jan 11, 1956  MR#: [id number]  FEO#:[id number]  Patient Care Team: Benay Pike, MD as PCP - General (Hematology and Oncology) Roosevelt Locks, CRNP as Nurse Practitioner (Nurse Practitioner) Rolm Bookbinder, MD as Consulting Physician (General Surgery) Laurence Spates, MD (Inactive) as Consulting Physician (Gastroenterology) [name], MD as Consulting Physician (Gastroenterology) OTHER MD:  CHIEF COMPLAINT:  Metastatic Breast Cancer (s/p right mastectomy)  CURRENT TREATMENT: [facility] and Faslodex   INTERVAL HISTORY:  Nicole [name] returns today for follow-up of her estrogen receptor positive stage IV breast cancer.  Since last visit she had left lower lobe lobectomy with lymph node excision which showed carcinoma with mucinous features consistent with metastatic breast carcinoma, negative margin, parenchymal margin negative for carcinoma, immunohistochemical findings consistent with metastatic breast carcinoma ER +100% strong staining PR 20% staining, Ki-67 of 10 % (initially reported 99% but this was confirmed with Dr Lourdes Sledge) HER2 group 5 negative She started Ibrance end of August, and has now been on it for almost 3 months. She has tolerated it very well except for intermittent mild diarrhea. She gets faslodex Q 28 days.  Rest of the pertinent 10 point ROS reviewed and negative  COVID 19 VACCINATION STATUS: Status post [place] x2+ booster August 2021   BREAST CANCER HISTORY: From Dr. Collier Salina Rubin's 07/25/2010 note:  "This woman has not had any significant medical intervention for some time.  Her last mammogram was about seven years ago.  She noted a right breast mass about a year ago and did not seek immediate medical attention for this.  She ultimately, after some time, admitted this to her husband and self-referred herself for intervention.  She had a mammogram on 07/17/2010 with an  ultrasound of the right breast.  This showed a lobulated mass upper right outer quadrant measuring at least 6.3 x 7.3 cm and large right axillary lymph nodes were also noted.  There was skin thickening overlying the mass.  On physical exam, this mass was about an 8 cm with skin dimpling noted. Discolored area in the skin over the mass, fullness in the axilla.  Biopsy was recommended, which took place on 07/17/2010.  Pathology showed invasive ductal cancer involving both lymph node and breast.  The HER-2 was not amplified.  ER and PR both positive at 100% and 6% respectively.  Proliferative index was 12% involving both the lymph node and breast mass.  An MRI scan of both breasts was performed on 07/22/2010, essentially which showed a large heterogeneously enhancing mass of the right breast with washout kinetics measuring 6.4 x 4.4 x 5.0 cm.  Numerous satellite nodules were seen throughout the dominant mass.  There were several suspicious nodules in the upper central and upper inner quadrant of the right breast.  There were two enhancing subcentimeter nodules seen in the right pectoralis muscle.  No obvious chest wall nodules were seen; however, there was seen some metastatic adenopathy in the mediastinum as well as hila with a 2.4 x 2.0 cm mediastinal lymph node as well as 1.2 x 3.0 cm subcarinal lymph node.  There was a right hilar and probable left hilar adenopathy.  Bilateral pulmonary nodules were seen and a right T2 lesion was seen anterior right liver as well."  Her subsequent history is as detailed below.   PAST MEDICAL HISTORY: Past Medical History:  Diagnosis Date   Anemia    low iron   Arthritis  Blood transfusion 2012   Breast CA ([place])    breast ca dx 06/2010, stage 4, right , Lung Metasis.Surgery, Chemo, Radiation   Breast cancer ([place])    Clot    jugular  ?2015   Depression    Esophageal varices ([place])    Headache    Mirgraine- rare since menopause   Heart murmur    mild, no  cardiologist, from birth   Hematemesis 08/2017   History of kidney stones    no pain- shows in CT- small 1 mm bilaterally- kidney   Hypertension    IBD (inflammatory bowel disease)    resolved   Idiopathic cirrhosis ([place])    pt denies   Peripheral vascular disease ([place]) few yrs ago   clot in right jugular   Portal hypertension ([place])    Radiation 07/21/12-09/07/12   5940 cGy   Restless legs     PAST SURGICAL HISTORY: Past Surgical History:  Procedure Laterality Date   BIOPSY  08/21/2020   Procedure: BIOPSY;  Surgeon: [name], MD;  Location: [facility];  Service: Gastroenterology;;   COLONOSCOPY  2016   DILATION AND CURETTAGE OF UTERUS  2004?   ESOPHAGEAL BANDING  09/04/2017   Procedure: ESOPHAGEAL BANDING;  Surgeon: [name], MD;  Location: [facility];  Service: Gastroenterology;;   ESOPHAGEAL BANDING  11/27/2017   Procedure: ESOPHAGEAL BANDING;  Surgeon: [name], MD;  Location: [place];  Service: Gastroenterology;;   ESOPHAGEAL BANDING N/A 04/28/2018   Procedure: ESOPHAGEAL BANDING;  Surgeon: [name], MD;  [facility];  Service: Gastroenterology;  Laterality: N/A;   ESOPHAGEAL BANDING  11/17/2018   Procedure: ESOPHAGEAL BANDING;  Surgeon: [name], MD;  Location: [facility];  Service: Gastroenterology;;   ESOPHAGEAL BANDING  08/16/2019   Procedure: ESOPHAGEAL BANDING;  Surgeon: [name], MD;  Location: [facility];  Service: Gastroenterology;;   ESOPHAGEAL BANDING N/A 08/21/2020   Procedure: ESOPHAGEAL BANDING;  Surgeon: [name], MD;  [facility];  Service: Gastroenterology;  Laterality: N/A;   ESOPHAGOGASTRODUODENOSCOPY N/A 04/28/2018   Procedure: ESOPHAGOGASTRODUODENOSCOPY (EGD);  Surgeon: [name], MD;  Location: [facility];  Service: Gastroenterology;  Laterality: N/A;   ESOPHAGOGASTRODUODENOSCOPY N/A 11/17/2018   Procedure: ESOPHAGOGASTRODUODENOSCOPY (EGD);  Surgeon: [name], MD;  Location: [facility];  Service:  Gastroenterology;  Laterality: N/A;   ESOPHAGOGASTRODUODENOSCOPY N/A 08/13/2022   Procedure: ESOPHAGOGASTRODUODENOSCOPY (EGD);  Surgeon: [name], MD;  Location: [facility];  Service: Gastroenterology;  Laterality: N/A;   ESOPHAGOGASTRODUODENOSCOPY (EGD) WITH PROPOFOL N/A 04/11/2016   Procedure: ESOPHAGOGASTRODUODENOSCOPY (EGD) WITH PROPOFOL;  Surgeon: Clarene Essex, MD;  [facility];  Service: Endoscopy;  Laterality: N/A;   ESOPHAGOGASTRODUODENOSCOPY (EGD) WITH PROPOFOL N/A 07/08/2016   Procedure: ESOPHAGOGASTRODUODENOSCOPY (EGD) WITH PROPOFOL;  Surgeon: Laurence Spates, MD;  Location: [place];  Service: Endoscopy;  Laterality: N/A;   ESOPHAGOGASTRODUODENOSCOPY (EGD) WITH PROPOFOL N/A 03/03/2017   Procedure: ESOPHAGOGASTRODUODENOSCOPY (EGD) WITH PROPOFOL;  Surgeon: Laurence Spates, MD;  Location: [place];  Service: Endoscopy;  Laterality: N/A;   ESOPHAGOGASTRODUODENOSCOPY (EGD) WITH PROPOFOL N/A 09/04/2017   Procedure: ESOPHAGOGASTRODUODENOSCOPY (EGD) WITH PROPOFOL;  Surgeon: [name], MD;  Location: [place];  Service: Gastroenterology;  Laterality: N/A;   ESOPHAGOGASTRODUODENOSCOPY (EGD) WITH PROPOFOL N/A 11/27/2017   Procedure: ESOPHAGOGASTRODUODENOSCOPY (EGD);  Surgeon: [name], MD;  Location: [place];  Service: Gastroenterology;  Laterality: N/A;   ESOPHAGOGASTRODUODENOSCOPY (EGD) WITH PROPOFOL N/A 08/16/2019   Procedure: ESOPHAGOGASTRODUODENOSCOPY (EGD) WITH PROPOFOL;  Surgeon: [name], MD;  [facility];  Service: Gastroenterology;  Laterality: N/A;  ESOPHAGOGASTRODUODENOSCOPY (EGD) WITH PROPOFOL N/A 08/21/2020   Procedure: ESOPHAGOGASTRODUODENOSCOPY (EGD) WITH PROPOFOL;  Surgeon: [name], MD;  [facility];  Service: Gastroenterology;  Laterality: N/A;   ESOPHAGOGASTRODUODENOSCOPY (EGD) WITH PROPOFOL N/A 08/24/2021   Procedure: ESOPHAGOGASTRODUODENOSCOPY (EGD) WITH PROPOFOL;  Surgeon: [name], MD;  [facility];  Service:  Gastroenterology;  Laterality: N/A;   GASTRIC VARICES BANDING N/A 08/24/2021   Procedure: GASTRIC VARICES BANDING;  Surgeon: [name], MD;  [facility];  Service: Gastroenterology;  Laterality: N/A;   GASTRIC VARICES BANDING N/A 08/13/2022   Procedure: GASTRIC VARICES BANDING;  Surgeon: [name], MD;  [facility];  Service: Gastroenterology;  Laterality: N/A;   HEMOSTASIS CLIP PLACEMENT  08/21/2020   Procedure: HEMOSTASIS CLIP PLACEMENT;  Surgeon: [name], MD;  [facility];  Service: Gastroenterology;;   INTERCOSTAL NERVE BLOCK Left 07/01/2022   Procedure: INTERCOSTAL NERVE BLOCK;  Surgeon: [name], MD;  Location: [place];  Service: Thoracic;  Laterality: Left;   IR GENERIC HISTORICAL  05/15/2016   IR RADIOLOGIST EVAL & MGMT 05/15/2016 Corrie Mckusick, DO [facility]   IR GENERIC HISTORICAL  01/14/2017   IR US GUIDE VASC ACCESS RIGHT 01/14/2017 Greggory Keen, MD [facility]   IR GENERIC HISTORICAL  01/14/2017   IR VENOGRAM HEPATIC W HEMODYNAMIC EVALUATION 01/14/2017 Greggory Keen, MD [facility]   IR RADIOLOGIST EVAL & MGMT  06/12/2017   IR RADIOLOGIST EVAL & MGMT  08/11/2018   IR RADIOLOGIST EVAL & MGMT  08/25/2019   IR RADIOLOGIST EVAL & MGMT  08/22/2020   IR RADIOLOGIST EVAL & MGMT  03/11/2022   MASTECTOMY Right 06/02/12   right breast with lymph node removal   NODE DISSECTION Left 07/01/2022   Procedure: NODE DISSECTION;  Surgeon: [name], MD;  Location: [facility] OR;  Service: Thoracic;  Laterality: Left;   PORT-A-CATH REMOVAL     PORTACATH PLACEMENT  2011   left side   RADIOLOGY WITH ANESTHESIA N/A 04/13/2016   Procedure: RADIOLOGY WITH ANESTHESIA;  Surgeon: Corrie Mckusick, DO;  Location: [place];  Service: Anesthesiology;  Laterality: N/A;   VIDEO BRONCHOSCOPY WITH ENDOBRONCHIAL NAVIGATION N/A 07/01/2022   Procedure: VIDEO BRONCHOSCOPY WITH ENDOBRONCHIAL NAVIGATION;  Surgeon: [name], MD;  [facility];  Service:  Thoracic;  Laterality: N/A;   XI ROBOTIC ASSISTED THORACOSCOPY- SEGMENTECTOMY Left 07/01/2022   Procedure: XI ROBOTIC ASSISTED THORACOSCOPY-LEFT LOWER LOBE BASILAR SEGMENTECTOMY;  Surgeon: [name], MD;  [facility];  Service: Thoracic;  Laterality: Left;    FAMILY HISTORY Family History  Problem Relation Age of Onset   COPD Mother    AAA (abdominal aortic aneurysm) Mother    COPD Father    Cancer Neg Hx   The patient's father died at the age of 14 from heart disease. Patient's mother died at the age of 69 status post CABG. The patient had no brothers, 2 sisters. There is no history of breast or ovarian cancer in the family.   GYNECOLOGIC HISTORY: Updated May 2018 Menarche age 58, she is GX P0. She does not recall when she went through menopause. She never took hormone replacement.   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 Husband Nicole [name] is retired from [employer]. They have no children and no pets. They attend a local [place].    ADVANCED DIRECTIVES: Not in place   HEALTH MAINTENANCE: Social  History   Tobacco Use   Smoking status: Never   Smokeless tobacco: Never  Vaping Use   Vaping Use: Never used  Substance Use Topics   Alcohol use: No   Drug use: No     Colonoscopy: Never  PAP: Remote  Bone density: 09/27/2013, osteoporosis with a T score of -2.8  Lipid panel: Not on file    No Active Allergies   Current Outpatient Medications  Medication Sig Dispense Refill   denosumab (PROLIA) 60 MG/ML SOLN injection Inject 60 mg into the skin every 6 (six) months. Administer in upper arm, thigh, or abdomen     ELIQUIS 5 MG TABS tablet TAKE 1 TABLET BY MOUTH TWICE A DAY 60 tablet 2   folic acid (FOLVITE) 1 MG tablet Take 1 mg by mouth daily.     Milk Thistle 1000 MG CAPS Take 1,000 mg by mouth daily.     palbociclib (IBRANCE) 100 MG capsule Take  1 capsule (100 mg total) by mouth daily with breakfast. Take whole with food. Take for 21 days on, 7 days off, repeat every 28 days. 21 capsule 1   pantoprazole (PROTONIX) 40 MG tablet Take 1 tablet (40 mg total) by mouth daily. 60 tablet 0   propranolol (INDERAL) 20 MG tablet Take 1 tablet (20 mg total) by mouth 2 (two) times daily. 60 tablet 0   venlafaxine XR (EFFEXOR-XR) 150 MG 24 hr capsule TAKE 1 CAPSULE BY MOUTH DAILY WITH BREAKFAST. 90 capsule 1   No current facility-administered medications for this visit.   Facility-Administered Medications Ordered in Other Visits  Medication Dose Route Frequency Provider Last Rate Last Admin   denosumab (PROLIA) injection 60 mg  60 mg Subcutaneous Once Magrinat, Virgie Dad, MD        OBJECTIVE: white woman who appears younger than stated age  66:   10/31/22 1107  BP: 129/75  Pulse: 83  Resp: 15  Temp: 97.7 F (36.5 C)  TempSrc: Oral  SpO2: 100%  Weight: 133 lb 3.2 oz (60.4 kg)     Physical Exam Constitutional:      Appearance: Normal appearance.  Cardiovascular:     Rate and Rhythm: Normal rate and regular rhythm.  Pulmonary:     Effort: Pulmonary effort is normal.     Breath sounds: Normal breath sounds.  Abdominal:     General: Abdomen is flat.     Palpations: Abdomen is soft.  Musculoskeletal:     Cervical back: Normal range of motion and neck supple. No rigidity.  Lymphadenopathy:     Cervical: No cervical adenopathy.  Skin:    General: Skin is warm and dry.  Neurological:     General: No focal deficit present.     Mental Status: She is alert.  Psychiatric:        Mood and Affect: Mood normal.    LAB RESULTS:  CMP     Component Value Date/Time   NA 139 09/23/2022 1327   NA 142 11/17/2017 1355   K 4.0 09/23/2022 1327   K 4.4 11/17/2017 1355   CL 110 09/23/2022 1327   CL 109 (H) 04/20/2013 0859   CO2 22 09/23/2022 1327   CO2 22 11/17/2017 1355   GLUCOSE 94 09/23/2022 1327   GLUCOSE 86 11/17/2017 1355    GLUCOSE 91 04/20/2013 0859   BUN 18 09/23/2022 1327   BUN 12.5 11/17/2017 1355   CREATININE 1.07 (H) 09/23/2022 1327   CREATININE 1.1 11/17/2017 1355   CALCIUM 8.4 (  L) 09/23/2022 1327   CALCIUM 9.2 11/17/2017 1355   PROT 6.7 09/23/2022 1327   PROT 7.0 11/17/2017 1355   ALBUMIN 4.0 09/23/2022 1327   ALBUMIN 3.8 11/17/2017 1355   AST 14 (L) 09/23/2022 1327   AST 23 11/17/2017 1355   ALT 11 09/23/2022 1327   ALT 22 11/17/2017 1355   ALKPHOS 54 09/23/2022 1327   ALKPHOS 55 11/17/2017 1355   BILITOT 0.7 09/23/2022 1327   BILITOT 0.49 11/17/2017 1355   GFRNONAA 57 (L) 09/23/2022 1327   GFRNONAA 53 (L) 05/09/2016 1104   GFRAA 53 (L) 06/12/2020 1146   GFRAA 55 (L) 12/13/2019 1200   GFRAA 61 05/09/2016 1104    No results found for: "TOTALPROTELP", "ALBUMINELP", "A1GS", "A2GS", "BETS", "BETA2SER", "GAMS", "MSPIKE", "SPEI"  Lab Results  Component Value Date   WBC 2.7 (L) 10/31/2022   NEUTROABS PENDING 10/31/2022   HGB 11.0 (L) 10/31/2022   HCT 34.2 (L) 10/31/2022   MCV 84.2 10/31/2022   PLT 115 (L) 10/31/2022    Lab Results  Component Value Date   LABCA2 18 08/21/2012    No components found for: "MMCRFV436"  No results for input(s): "INR" in the last 168 hours.  Lab Results  Component Value Date   LABCA2 18 08/21/2012    No results found for: "GOV703"  No results found for: "CAN125"  No results found for: "EKB524"  Lab Results  Component Value Date   CA2729 26.9 12/13/2019    No components found for: "HGQUANT"  No results found for: "CEA1", "CEA" / No results found for: "CEA1", "CEA"   No results found for: "AFPTUMOR"  No results found for: "CHROMOGRNA"  No results found for: "KPAFRELGTCHN", "LAMBDASER", "KAPLAMBRATIO" (kappa/lambda light chains)  No results found for: "HGBA", "HGBA2QUANT", "HGBFQUANT", "HGBSQUAN" (Hemoglobinopathy evaluation)   Lab Results  Component Value Date   LDH 195 08/21/2012    Lab Results  Component Value Date   IRON  27 (L) 04/11/2016   TIBC 340 04/11/2016   IRONPCTSAT 8 (L) 04/11/2016   (Iron and TIBC)  Lab Results  Component Value Date   FERRITIN 18 04/11/2016    Urinalysis    Component Value Date/Time   COLORURINE YELLOW 06/28/2022 1122   APPEARANCEUR CLEAR 06/28/2022 1122   LABSPEC 1.015 06/28/2022 1122   LABSPEC 1.010 04/29/2016 1118   PHURINE 6.0 06/28/2022 1122   GLUCOSEU NEGATIVE 06/28/2022 1122   GLUCOSEU Negative 04/29/2016 1118   HGBUR NEGATIVE 06/28/2022 1122   BILIRUBINUR NEGATIVE 06/28/2022 1122   BILIRUBINUR Negative 04/29/2016 1118   KETONESUR NEGATIVE 06/28/2022 1122   PROTEINUR NEGATIVE 06/28/2022 1122   UROBILINOGEN 0.2 04/29/2016 1118   NITRITE NEGATIVE 06/28/2022 1122   LEUKOCYTESUR NEGATIVE 06/28/2022 1122   LEUKOCYTESUR Trace 04/29/2016 1118    STUDIES: DG Chest 2 View  Result Date: 10/01/2022 CLINICAL DATA:  Lung nodule EXAM: CHEST - 2 VIEW COMPARISON:  Chest x-ray dated August 06, 2022 FINDINGS: Visualized cardiac and mediastinal contours within normal limits. Left-greater-than-right biapical pleural-parenchymal scarring. Stable moderate left and small right pleural effusions. Postsurgical changes of the right lung. IMPRESSION: Stable moderate left and small right pleural effusions. Electronically Signed   By: Yetta Glassman M.D.   On: 10/01/2022 16:04    COMPARISON: Abdominal ultrasound November 05, 2021   TECHNIQUE:  Real time limited abdomen ultrasound was performed.   IMAGE QUALITY: Satisfactory, of diagnostic value.   FINDINGS:   Limited sonographic views of the abdomen demonstrates mild abdominal ascites. There is no large pocket to safely  drain.    IMPRESSION:  IMPRESSION:  1. Mild abdominal ascites.  2. No large enough pocket to safely drain.    ASSESSMENT: 66 y.o. [place] woman presenting June 2011 with stage IV breast cancer involving the right breast upper outer quadrant, right axilla, mediastinal lymph nodes, and both lungs, but not the  brain, liver, or bones  (1) positive right breast and right axillary lymph node biopsies 07/17/2010 of a clinical T4 N2 M1 invasive ductal carcinoma, grade 1, estrogen receptor 100% positive, progesterone receptor 6% positive, with an MIB-1 of 12%, and no HER-2 amplification.  (2) participated in Phase II tessetaxel study, receiving 2 cycles complicated by thrombocytopenia, anemia requiring transfusion, transaminase elevation, and afebrile neutropenia. Off-study as of September 2011. Chest CT scan September 2011 did show evidence of response.  (3) on letrozole as of October 2011, with continuing response and good tolerance   (4) 06/02/2012 underwent right mastectomy and axillary lymph node sampling (3 lymph nodes removed, all with viable cancer as well as evidence of treatment effect) for a ypT2 ypN1-2 invasive ductal carcinoma, grade 1, 98% estrogen receptor positive, progesterone receptor negative, with no HER-2 amplification  (5) left jugular vein DVT documented 06/16/2012; left sided Port-A-Cath removed mid July 2015; Coumadin stopped 09/07/2014.  (a) Left brachiocephalic v. slightly narrowed, with some Left lower neck collateralization noted on chest CT December 2015  (6) osteoporosis, was on alendronate, switched to denosumab/Prolia 11/07/2016  (a) bone density in 10/2018 shows osteopenia T score of -2.2  (7) portal hypertension, with splenomegaly, gastric varices, and intestinal edema  (a)  mild persistent leukopenia and thrombocytopenia likely due to splenomegaly  (b) iron deficiency secondary to above, status post Feraheme x2 December 2018  (8) restaging studies:  (a) chest CT 12/26/2017 shows a 4.5 mm left lower lobe pulmonary nodule which is unchanged.  There was no other evidence of active disease  (b) restaging chest CT in 05/2019 shows no change in bilateral pulmonary nodules, prominent mediastinal lymph nodes are stable  (9) CT scan of the abdomen 11/03/2021 shows thrombus in  the portal, splenic and superior mesenteric vein  (A) Heparin started 11/03/2021  10. PET scan 08/16/5630, low metabolic activity of right lower pulmonary nodule. No change in size of CT 02/27/2022, Carcinoma with mucinous features consistent with metastatic breast carcinoma, negative margins, no LN involvement.  Prognostics showed ER +100% strong staining, PR +20% staining, Ki-67 of 10% and HER2 2+ by IHC and negative by FISH  PLAN:  Given metastatic breast cancer progression on letrozole, we discussed about adding Faslodex with Ibrance.  She is currently on this combination and is tolerating it very well.   She reports some intermittent mild diarrhea but otherwise no complaints.  Physical examination today without any concerns.  We will repeat PET/CT imaging to evaluate response hopefully in late November early December. Labs reviewed, leukopenia as expected but no definite change in dose required at this time. She has baseline thrombocytopenia due to splenomegaly, this has not significantly changed. Return to clinic in about 4 weeks She also continues on Prolia every 6 months for osteoporosis, last bone density in March 2023 with a T score of -2.4 improved Total encounter time 30 minutes.   *Total Encounter Time as defined by the Centers for Medicare and Medicaid Services includes, in addition to the face-to-face time of a patient visit (documented in the note above) non-face-to-face time: obtaining and reviewing outside history, ordering and reviewing medications, tests or procedures, care coordination (communications with other health  care professionals or caregivers) and documentation in the medical record.

## 2022-11-05 NOTE — Telephone Encounter (Signed)
Oral Oncology Patient Advocate Encounter   Submitted MD signatures via online portal.  I will continue to follow until final determination.  Lady Deutscher, CPhT-Adv Oncology Pharmacy Patient [place] Direct Number: [phone]  Fax: [phone]

## 2022-11-11 ENCOUNTER — Other Ambulatory Visit: Payer: Self-pay | Admitting: *Deleted

## 2022-11-11 DIAGNOSIS — C50411 Malignant neoplasm of upper-outer quadrant of right female breast: Secondary | ICD-10-CM

## 2022-11-11 MED ORDER — VENLAFAXINE HCL ER 150 MG PO CP24: 150.0000 mg | ORAL_CAPSULE | Freq: Every day | ORAL | 1 refills | Status: DC

## 2022-12-02 ENCOUNTER — Other Ambulatory Visit: Payer: Self-pay

## 2022-12-02 ENCOUNTER — Other Ambulatory Visit (HOSPITAL_COMMUNITY): Payer: Self-pay

## 2022-12-02 ENCOUNTER — Telehealth: Payer: Self-pay

## 2022-12-02 ENCOUNTER — Inpatient Hospital Stay: Payer: Medicare Other

## 2022-12-02 ENCOUNTER — Inpatient Hospital Stay: Payer: Medicare Other | Attending: Hematology and Oncology | Admitting: Hematology and Oncology

## 2022-12-02 VITALS — BP 119/87 | HR 66 | Temp 97.3°F | Resp 14 | Ht 64.0 in | Wt 134.7 lb

## 2022-12-02 DIAGNOSIS — C50411 Malignant neoplasm of upper-outer quadrant of right female breast: Secondary | ICD-10-CM

## 2022-12-02 DIAGNOSIS — C7802 Secondary malignant neoplasm of left lung: Secondary | ICD-10-CM | POA: Diagnosis not present

## 2022-12-02 DIAGNOSIS — E611 Iron deficiency: Secondary | ICD-10-CM | POA: Insufficient documentation

## 2022-12-02 DIAGNOSIS — Z5111 Encounter for antineoplastic chemotherapy: Secondary | ICD-10-CM | POA: Diagnosis present

## 2022-12-02 DIAGNOSIS — Z170 Estrogen receptor positive status [ER+]: Secondary | ICD-10-CM | POA: Diagnosis not present

## 2022-12-02 DIAGNOSIS — K766 Portal hypertension: Secondary | ICD-10-CM | POA: Insufficient documentation

## 2022-12-02 DIAGNOSIS — R161 Splenomegaly, not elsewhere classified: Secondary | ICD-10-CM | POA: Insufficient documentation

## 2022-12-02 DIAGNOSIS — C7801 Secondary malignant neoplasm of right lung: Secondary | ICD-10-CM | POA: Insufficient documentation

## 2022-12-02 DIAGNOSIS — C50911 Malignant neoplasm of unspecified site of right female breast: Secondary | ICD-10-CM

## 2022-12-02 DIAGNOSIS — Z9011 Acquired absence of right breast and nipple: Secondary | ICD-10-CM | POA: Diagnosis not present

## 2022-12-02 DIAGNOSIS — C778 Secondary and unspecified malignant neoplasm of lymph nodes of multiple regions: Secondary | ICD-10-CM | POA: Insufficient documentation

## 2022-12-02 DIAGNOSIS — M810 Age-related osteoporosis without current pathological fracture: Secondary | ICD-10-CM

## 2022-12-02 LAB — CBC WITH DIFFERENTIAL/PLATELET
Abs Immature Granulocytes: 0.01 10*3/uL (ref 0.00–0.07)
Basophils Absolute: 0 10*3/uL (ref 0.0–0.1)
Basophils Relative: 1 %
Eosinophils Absolute: 0 10*3/uL (ref 0.0–0.5)
Eosinophils Relative: 1 %
HCT: 32.5 % — ABNORMAL LOW (ref 36.0–46.0)
Hemoglobin: 10.7 g/dL — ABNORMAL LOW (ref 12.0–15.0)
Immature Granulocytes: 0 %
Lymphocytes Relative: 16 %
Lymphs Abs: 0.4 10*3/uL — ABNORMAL LOW (ref 0.7–4.0)
MCH: 30.7 pg (ref 26.0–34.0)
MCHC: 32.9 g/dL (ref 30.0–36.0)
MCV: 93.1 fL (ref 80.0–100.0)
Monocytes Absolute: 0.2 10*3/uL (ref 0.1–1.0)
Monocytes Relative: 7 %
Neutro Abs: 1.8 10*3/uL (ref 1.7–7.7)
Neutrophils Relative %: 75 %
Platelets: 68 10*3/uL — ABNORMAL LOW (ref 150–400)
RBC: 3.49 MIL/uL — ABNORMAL LOW (ref 3.87–5.11)
RDW: 27.2 % — ABNORMAL HIGH (ref 11.5–15.5)
WBC: 2.4 10*3/uL — ABNORMAL LOW (ref 4.0–10.5)
nRBC: 0 % (ref 0.0–0.2)

## 2022-12-02 MED ORDER — PALBOCICLIB 75 MG PO CAPS
75.0000 mg | ORAL_CAPSULE | Freq: Every day | ORAL | 3 refills | Status: DC
  Filled 2022-12-02: qty 30, 30d supply, fill #0

## 2022-12-02 MED ORDER — FULVESTRANT 250 MG/5ML IM SOSY
500.0000 mg | PREFILLED_SYRINGE | Freq: Once | INTRAMUSCULAR | Status: AC
  Administered 2022-12-02: 500 mg via INTRAMUSCULAR
  Filled 2022-12-02: qty 10

## 2022-12-02 NOTE — Progress Notes (Unsigned)
[place]  Telephone:([phone]) [phone] Fax:(336) [phone]    ID: Nicole [name]   DOB: 27-Feb-1956  MR#: [id number]  IFB#:[id number]  Patient Care Team: Benay Pike, MD as PCP - General (Hematology and Oncology) Roosevelt Locks, CRNP as Nurse Practitioner (Nurse Practitioner) Rolm Bookbinder, MD as Consulting Physician (General Surgery) Laurence Spates, MD (Inactive) as Consulting Physician (Gastroenterology) [name], MD as Consulting Physician (Gastroenterology)  CHIEF COMPLAINT:  Metastatic Breast Cancer (s/p right mastectomy)  CURRENT TREATMENT: [facility] and Faslodex   INTERVAL HISTORY:  Nicole [name] returns today for follow-up of her estrogen receptor positive stage IV breast cancer.  Since last visit she had left lower lobe lobectomy with lymph node excision which showed carcinoma with mucinous features consistent with metastatic breast carcinoma, negative margin, parenchymal margin negative for carcinoma, immunohistochemical findings consistent with metastatic breast carcinoma ER +100% strong staining PR 20% staining, Ki-67 of 10 % (initially reported 99% but this was confirmed with Dr Lourdes Sledge) HER2 group 5 negative She started Ibrance end of August, and has now been on it for almost 3 months. She has tolerated it very well except for intermittent mild diarrhea. She gets faslodex Q 28 days.  Rest of the pertinent 10 point ROS reviewed and negative  COVID 19 VACCINATION STATUS: Status post [place] x2+ booster August 2021   BREAST CANCER HISTORY: From Dr. Collier Salina Rubin's 07/25/2010 note:  "This woman has not had any significant medical intervention for some time.  Her last mammogram was about seven years ago.  She noted a right breast mass about a year ago and did not seek immediate medical attention for this.  She ultimately, after some time, admitted this to her husband and self-referred herself for intervention.  She had a mammogram on 07/17/2010 with an ultrasound of  the right breast.  This showed a lobulated mass upper right outer quadrant measuring at least 6.3 x 7.3 cm and large right axillary lymph nodes were also noted.  There was skin thickening overlying the mass.  On physical exam, this mass was about an 8 cm with skin dimpling noted. Discolored area in the skin over the mass, fullness in the axilla.  Biopsy was recommended, which took place on 07/17/2010.  Pathology showed invasive ductal cancer involving both lymph node and breast.  The HER-2 was not amplified.  ER and PR both positive at 100% and 6% respectively.  Proliferative index was 12% involving both the lymph node and breast mass.  An MRI scan of both breasts was performed on 07/22/2010, essentially which showed a large heterogeneously enhancing mass of the right breast with washout kinetics measuring 6.4 x 4.4 x 5.0 cm.  Numerous satellite nodules were seen throughout the dominant mass.  There were several suspicious nodules in the upper central and upper inner quadrant of the right breast.  There were two enhancing subcentimeter nodules seen in the right pectoralis muscle.  No obvious chest wall nodules were seen; however, there was seen some metastatic adenopathy in the mediastinum as well as hila with a 2.4 x 2.0 cm mediastinal lymph node as well as 1.2 x 3.0 cm subcarinal lymph node.  There was a right hilar and probable left hilar adenopathy.  Bilateral pulmonary nodules were seen and a right T2 lesion was seen anterior right liver as well."  Her subsequent history is as detailed below.   PAST MEDICAL HISTORY: Past Medical History:  Diagnosis Date   Anemia    low iron   Arthritis    Blood transfusion  2012   Breast CA ([place])    breast ca dx 06/2010, stage 4, right , Lung Metasis.Surgery, Chemo, Radiation   Breast cancer ([place])    Clot    jugular  ?2015   Depression    Esophageal varices ([place])    Headache    Mirgraine- rare since menopause   Heart murmur    mild, no cardiologist, from  birth   Hematemesis 08/2017   History of kidney stones    no pain- shows in CT- small 1 mm bilaterally- kidney   Hypertension    IBD (inflammatory bowel disease)    resolved   Idiopathic cirrhosis ([place])    pt denies   Peripheral vascular disease ([place]) few yrs ago   clot in right jugular   Portal hypertension ([place])    Radiation 07/21/12-09/07/12   5940 cGy   Restless legs     PAST SURGICAL HISTORY: Past Surgical History:  Procedure Laterality Date   BIOPSY  08/21/2020   Procedure: BIOPSY;  Surgeon: [name], MD;  Location: [facility];  Service: Gastroenterology;;   COLONOSCOPY  2016   DILATION AND CURETTAGE OF UTERUS  2004?   ESOPHAGEAL BANDING  09/04/2017   Procedure: ESOPHAGEAL BANDING;  Surgeon: [name], MD;  Location: [facility] ENDOSCOPY;  Service: Gastroenterology;;   ESOPHAGEAL BANDING  11/27/2017   Procedure: ESOPHAGEAL BANDING;  Surgeon: [name], MD;  Location: [place];  Service: Gastroenterology;;   ESOPHAGEAL BANDING N/A 04/28/2018   Procedure: ESOPHAGEAL BANDING;  Surgeon: [name], MD;  [facility];  Service: Gastroenterology;  Laterality: N/A;   ESOPHAGEAL BANDING  11/17/2018   Procedure: ESOPHAGEAL BANDING;  Surgeon: [name], MD;  Location: [facility];  Service: Gastroenterology;;   ESOPHAGEAL BANDING  08/16/2019   Procedure: ESOPHAGEAL BANDING;  Surgeon: [name], MD;  Location: [facility];  Service: Gastroenterology;;   ESOPHAGEAL BANDING N/A 08/21/2020   Procedure: ESOPHAGEAL BANDING;  Surgeon: [name], MD;  [facility];  Service: Gastroenterology;  Laterality: N/A;   ESOPHAGOGASTRODUODENOSCOPY N/A 04/28/2018   Procedure: ESOPHAGOGASTRODUODENOSCOPY (EGD);  Surgeon: [name], MD;  Location: [facility];  Service: Gastroenterology;  Laterality: N/A;   ESOPHAGOGASTRODUODENOSCOPY N/A 11/17/2018   Procedure: ESOPHAGOGASTRODUODENOSCOPY (EGD);  Surgeon: [name], MD;  Location: [facility];  Service: Gastroenterology;   Laterality: N/A;   ESOPHAGOGASTRODUODENOSCOPY N/A 08/13/2022   Procedure: ESOPHAGOGASTRODUODENOSCOPY (EGD);  Surgeon: [name], MD;  Location: [facility];  Service: Gastroenterology;  Laterality: N/A;   ESOPHAGOGASTRODUODENOSCOPY (EGD) WITH PROPOFOL N/A 04/11/2016   Procedure: ESOPHAGOGASTRODUODENOSCOPY (EGD) WITH PROPOFOL;  Surgeon: Clarene Essex, MD;  [facility];  Service: Endoscopy;  Laterality: N/A;   ESOPHAGOGASTRODUODENOSCOPY (EGD) WITH PROPOFOL N/A 07/08/2016   Procedure: ESOPHAGOGASTRODUODENOSCOPY (EGD) WITH PROPOFOL;  Surgeon: Laurence Spates, MD;  Location: [place];  Service: Endoscopy;  Laterality: N/A;   ESOPHAGOGASTRODUODENOSCOPY (EGD) WITH PROPOFOL N/A 03/03/2017   Procedure: ESOPHAGOGASTRODUODENOSCOPY (EGD) WITH PROPOFOL;  Surgeon: Laurence Spates, MD;  Location: [place];  Service: Endoscopy;  Laterality: N/A;   ESOPHAGOGASTRODUODENOSCOPY (EGD) WITH PROPOFOL N/A 09/04/2017   Procedure: ESOPHAGOGASTRODUODENOSCOPY (EGD) WITH PROPOFOL;  Surgeon: [name], MD;  Location: [facility];  Service: Gastroenterology;  Laterality: N/A;   ESOPHAGOGASTRODUODENOSCOPY (EGD) WITH PROPOFOL N/A 11/27/2017   Procedure: ESOPHAGOGASTRODUODENOSCOPY (EGD);  Surgeon: [name], MD;  Location: [place];  Service: Gastroenterology;  Laterality: N/A;   ESOPHAGOGASTRODUODENOSCOPY (EGD) WITH PROPOFOL N/A 08/16/2019   Procedure: ESOPHAGOGASTRODUODENOSCOPY (EGD) WITH PROPOFOL;  Surgeon: [name], MD;  [facility];  Service: Gastroenterology;  Laterality: N/A;   ESOPHAGOGASTRODUODENOSCOPY (EGD)  WITH PROPOFOL N/A 08/21/2020   Procedure: ESOPHAGOGASTRODUODENOSCOPY (EGD) WITH PROPOFOL;  Surgeon: [name], MD;  [facility];  Service: Gastroenterology;  Laterality: N/A;   ESOPHAGOGASTRODUODENOSCOPY (EGD) WITH PROPOFOL N/A 08/24/2021   Procedure: ESOPHAGOGASTRODUODENOSCOPY (EGD) WITH PROPOFOL;  Surgeon: [name], MD;  [facility];  Service: Gastroenterology;   Laterality: N/A;   GASTRIC VARICES BANDING N/A 08/24/2021   Procedure: GASTRIC VARICES BANDING;  Surgeon: [name], MD;  [facility];  Service: Gastroenterology;  Laterality: N/A;   GASTRIC VARICES BANDING N/A 08/13/2022   Procedure: GASTRIC VARICES BANDING;  Surgeon: [name], MD;  [facility];  Service: Gastroenterology;  Laterality: N/A;   HEMOSTASIS CLIP PLACEMENT  08/21/2020   Procedure: HEMOSTASIS CLIP PLACEMENT;  Surgeon: [name], MD;  [facility];  Service: Gastroenterology;;   INTERCOSTAL NERVE BLOCK Left 07/01/2022   Procedure: INTERCOSTAL NERVE BLOCK;  Surgeon: [name], MD;  Location: [place];  Service: Thoracic;  Laterality: Left;   IR GENERIC HISTORICAL  05/15/2016   IR RADIOLOGIST EVAL & MGMT 05/15/2016 Corrie Mckusick, DO [facility] INTERV RAD   IR GENERIC HISTORICAL  01/14/2017   IR US GUIDE VASC ACCESS RIGHT 01/14/2017 Greggory Keen, MD [facility]   IR GENERIC HISTORICAL  01/14/2017   IR VENOGRAM HEPATIC W HEMODYNAMIC EVALUATION 01/14/2017 Greggory Keen, MD [facility]   IR RADIOLOGIST EVAL & MGMT  06/12/2017   IR RADIOLOGIST EVAL & MGMT  08/11/2018   IR RADIOLOGIST EVAL & MGMT  08/25/2019   IR RADIOLOGIST EVAL & MGMT  08/22/2020   IR RADIOLOGIST EVAL & MGMT  03/11/2022   MASTECTOMY Right 06/02/12   right breast with lymph node removal   NODE DISSECTION Left 07/01/2022   Procedure: NODE DISSECTION;  Surgeon: [name], MD;  Location: [facility];  Service: Thoracic;  Laterality: Left;   PORT-A-CATH REMOVAL     PORTACATH PLACEMENT  2011   left side   RADIOLOGY WITH ANESTHESIA N/A 04/13/2016   Procedure: RADIOLOGY WITH ANESTHESIA;  Surgeon: Corrie Mckusick, DO;  Location: [place];  Service: Anesthesiology;  Laterality: N/A;   VIDEO BRONCHOSCOPY WITH ENDOBRONCHIAL NAVIGATION N/A 07/01/2022   Procedure: VIDEO BRONCHOSCOPY WITH ENDOBRONCHIAL NAVIGATION;  Surgeon: [name], MD;  [facility];  Service: Thoracic;  Laterality: N/A;    XI ROBOTIC ASSISTED THORACOSCOPY- SEGMENTECTOMY Left 07/01/2022   Procedure: XI ROBOTIC ASSISTED THORACOSCOPY-LEFT LOWER LOBE BASILAR SEGMENTECTOMY;  Surgeon: [name], MD;  [facility];  Service: Thoracic;  Laterality: Left;    FAMILY HISTORY Family History  Problem Relation Age of Onset   COPD Mother    AAA (abdominal aortic aneurysm) Mother    COPD Father    Cancer Neg Hx   The patient's father died at the age of 10 from heart disease. Patient's mother died at the age of 57 status post CABG. The patient had no brothers, 2 sisters. There is no history of breast or ovarian cancer in the family.   GYNECOLOGIC HISTORY: Updated May 2018 Menarche age 49, she is GX P0. She does not recall when she went through menopause. She never took hormone replacement.   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 Husband Nicole [name] is retired from [employer]. They have no children and no pets. They attend a local [employer] [place].    ADVANCED DIRECTIVES: Not in place   HEALTH MAINTENANCE: Social History  Tobacco Use   Smoking status: Never   Smokeless tobacco: Never  Vaping Use   Vaping Use: Never used  Substance Use Topics   Alcohol use: No   Drug use: No     Colonoscopy: Never  PAP: Remote  Bone density: 09/27/2013, osteoporosis with a T score of -2.8  Lipid panel: Not on file    No Active Allergies   Current Outpatient Medications  Medication Sig Dispense Refill   denosumab (PROLIA) 60 MG/ML SOLN injection Inject 60 mg into the skin every 6 (six) months. Administer in upper arm, thigh, or abdomen     ELIQUIS 5 MG TABS tablet TAKE 1 TABLET BY MOUTH TWICE A DAY 60 tablet 2   folic acid (FOLVITE) 1 MG tablet Take 1 mg by mouth daily.     Milk Thistle 1000 MG CAPS Take 1,000 mg by mouth daily.     palbociclib (IBRANCE) 100 MG capsule Take 1 capsule (100 mg total) by  mouth daily with breakfast. Take whole with food. Take for 21 days on, 7 days off, repeat every 28 days. 21 capsule 1   pantoprazole (PROTONIX) 40 MG tablet Take 1 tablet (40 mg total) by mouth daily. 60 tablet 0   propranolol (INDERAL) 20 MG tablet Take 1 tablet (20 mg total) by mouth 2 (two) times daily. 60 tablet 0   venlafaxine XR (EFFEXOR-XR) 150 MG 24 hr capsule Take 1 capsule (150 mg total) by mouth daily with breakfast. 90 capsule 1   No current facility-administered medications for this visit.   Facility-Administered Medications Ordered in Other Visits  Medication Dose Route Frequency Provider Last Rate Last Admin   denosumab (PROLIA) injection 60 mg  60 mg Subcutaneous Once Magrinat, Virgie Dad, MD        OBJECTIVE: white woman who appears younger than stated age  66:   12/02/22 1315  BP: 119/87  Pulse: 66  Resp: 14  Temp: (!) 97.3 F (36.3 C)  TempSrc: Temporal  SpO2: 99%  Weight: 134 lb 11.2 oz (61.1 kg)  Height: _0  (1.626 m)     Physical Exam Constitutional:      Appearance: Normal appearance.  Cardiovascular:     Rate and Rhythm: Normal rate and regular rhythm.  Pulmonary:     Effort: Pulmonary effort is normal.     Breath sounds: Normal breath sounds.  Abdominal:     General: Abdomen is flat.     Palpations: Abdomen is soft.  Musculoskeletal:     Cervical back: Normal range of motion and neck supple. No rigidity.  Lymphadenopathy:     Cervical: No cervical adenopathy.  Skin:    General: Skin is warm and dry.  Neurological:     General: No focal deficit present.     Mental Status: She is alert.  Psychiatric:        Mood and Affect: Mood normal.    LAB RESULTS:  CMP     Component Value Date/Time   NA 139 09/23/2022 1327   NA 142 11/17/2017 1355   K 4.0 09/23/2022 1327   K 4.4 11/17/2017 1355   CL 110 09/23/2022 1327   CL 109 (H) 04/20/2013 0859   CO2 22 09/23/2022 1327   CO2 22 11/17/2017 1355   GLUCOSE 94 09/23/2022 1327   GLUCOSE  86 11/17/2017 1355   GLUCOSE 91 04/20/2013 0859   BUN 18 09/23/2022 1327   BUN 12.5 11/17/2017 1355   CREATININE 1.07 (H) 09/23/2022 1327   CREATININE  1.1 11/17/2017 1355   CALCIUM 8.4 (L) 09/23/2022 1327   CALCIUM 9.2 11/17/2017 1355   PROT 6.7 09/23/2022 1327   PROT 7.0 11/17/2017 1355   ALBUMIN 4.0 09/23/2022 1327   ALBUMIN 3.8 11/17/2017 1355   AST 14 (L) 09/23/2022 1327   AST 23 11/17/2017 1355   ALT 11 09/23/2022 1327   ALT 22 11/17/2017 1355   ALKPHOS 54 09/23/2022 1327   ALKPHOS 55 11/17/2017 1355   BILITOT 0.7 09/23/2022 1327   BILITOT 0.49 11/17/2017 1355   GFRNONAA 57 (L) 09/23/2022 1327   GFRNONAA 53 (L) 05/09/2016 1104   GFRAA 53 (L) 06/12/2020 1146   GFRAA 55 (L) 12/13/2019 1200   GFRAA 61 05/09/2016 1104    No results found for: "TOTALPROTELP", "ALBUMINELP", "A1GS", "A2GS", "BETS", "BETA2SER", "GAMS", "MSPIKE", "SPEI"  Lab Results  Component Value Date   WBC 2.4 (L) 12/02/2022   NEUTROABS PENDING 12/02/2022   HGB 10.7 (L) 12/02/2022   HCT 32.5 (L) 12/02/2022   MCV 93.1 12/02/2022   PLT 68 (L) 12/02/2022    Lab Results  Component Value Date   LABCA2 18 08/21/2012    No components found for: "FKCLEX517"  No results for input(s): "INR" in the last 168 hours.  Lab Results  Component Value Date   LABCA2 18 08/21/2012    No results found for: "GYF749"  No results found for: "CAN125"  No results found for: "SWH675"  Lab Results  Component Value Date   CA2729 26.9 12/13/2019    No components found for: "HGQUANT"  No results found for: "CEA1", "CEA" / No results found for: "CEA1", "CEA"   No results found for: "AFPTUMOR"  No results found for: "CHROMOGRNA"  No results found for: "KPAFRELGTCHN", "LAMBDASER", "KAPLAMBRATIO" (kappa/lambda light chains)  No results found for: "HGBA", "HGBA2QUANT", "HGBFQUANT", "HGBSQUAN" (Hemoglobinopathy evaluation)   Lab Results  Component Value Date   LDH 195 08/21/2012    Lab Results  Component  Value Date   IRON 27 (L) 04/11/2016   TIBC 340 04/11/2016   IRONPCTSAT 8 (L) 04/11/2016   (Iron and TIBC)  Lab Results  Component Value Date   FERRITIN 18 04/11/2016    Urinalysis    Component Value Date/Time   COLORURINE YELLOW 06/28/2022 1122   APPEARANCEUR CLEAR 06/28/2022 1122   LABSPEC 1.015 06/28/2022 1122   LABSPEC 1.010 04/29/2016 1118   PHURINE 6.0 06/28/2022 1122   GLUCOSEU NEGATIVE 06/28/2022 1122   GLUCOSEU Negative 04/29/2016 1118   HGBUR NEGATIVE 06/28/2022 1122   BILIRUBINUR NEGATIVE 06/28/2022 1122   BILIRUBINUR Negative 04/29/2016 1118   KETONESUR NEGATIVE 06/28/2022 1122   PROTEINUR NEGATIVE 06/28/2022 1122   UROBILINOGEN 0.2 04/29/2016 1118   NITRITE NEGATIVE 06/28/2022 1122   LEUKOCYTESUR NEGATIVE 06/28/2022 1122   LEUKOCYTESUR Trace 04/29/2016 1118    STUDIES: No results found.  COMPARISON: Abdominal ultrasound November 05, 2021   TECHNIQUE:  Real time limited abdomen ultrasound was performed.   IMAGE QUALITY: Satisfactory, of diagnostic value.   FINDINGS:   Limited sonographic views of the abdomen demonstrates mild abdominal ascites. There is no large pocket to safely drain.    IMPRESSION:  IMPRESSION:  1. Mild abdominal ascites.  2. No large enough pocket to safely drain.    ASSESSMENT: 66 y.o. [place] woman presenting June 2011 with stage IV breast cancer involving the right breast upper outer quadrant, right axilla, mediastinal lymph nodes, and both lungs, but not the brain, liver, or bones  (1) positive right breast and right axillary lymph  node biopsies 07/17/2010 of a clinical T4 N2 M1 invasive ductal carcinoma, grade 1, estrogen receptor 100% positive, progesterone receptor 6% positive, with an MIB-1 of 12%, and no HER-2 amplification.  (2) participated in Phase II tessetaxel study, receiving 2 cycles complicated by thrombocytopenia, anemia requiring transfusion, transaminase elevation, and afebrile neutropenia. Off-study as of  September 2011. Chest CT scan September 2011 did show evidence of response.  (3) on letrozole as of October 2011, with continuing response and good tolerance   (4) 06/02/2012 underwent right mastectomy and axillary lymph node sampling (3 lymph nodes removed, all with viable cancer as well as evidence of treatment effect) for a ypT2 ypN1-2 invasive ductal carcinoma, grade 1, 98% estrogen receptor positive, progesterone receptor negative, with no HER-2 amplification  (5) left jugular vein DVT documented 06/16/2012; left sided Port-A-Cath removed mid July 2015; Coumadin stopped 09/07/2014.  (a) Left brachiocephalic v. slightly narrowed, with some Left lower neck collateralization noted on chest CT December 2015  (6) osteoporosis, was on alendronate, switched to denosumab/Prolia 11/07/2016  (a) bone density in 10/2018 shows osteopenia T score of -2.2  (7) portal hypertension, with splenomegaly, gastric varices, and intestinal edema  (a)  mild persistent leukopenia and thrombocytopenia likely due to splenomegaly  (b) iron deficiency secondary to above, status post Feraheme x2 December 2018  (8) restaging studies:  (a) chest CT 12/26/2017 shows a 4.5 mm left lower lobe pulmonary nodule which is unchanged.  There was no other evidence of active disease  (b) restaging chest CT in 05/2019 shows no change in bilateral pulmonary nodules, prominent mediastinal lymph nodes are stable  (9) CT scan of the abdomen 11/03/2021 shows thrombus in the portal, splenic and superior mesenteric vein  (A) Heparin started 11/03/2021  10. PET scan 6/94/8546, low metabolic activity of right lower pulmonary nodule. No change in size of CT 02/27/2022, Carcinoma with mucinous features consistent with metastatic breast carcinoma, negative margins, no LN involvement.  Prognostics showed ER +100% strong staining, PR +20% staining, Ki-67 of 10% and HER2 2+ by IHC and negative by FISH  PLAN:  Given metastatic breast cancer  progression on letrozole, we discussed about adding Faslodex with Ibrance.  She is currently on this combination and is tolerating it very well.   She has baseline thrombocytopenia due to splenomegaly, this has not significantly changed. She also continues on Prolia every 6 months for osteoporosis, last bone density in March 2023 with a T score of -2.4 improved Total encounter time 30 minutes.   *Total Encounter Time as defined by the Centers for Medicare and Medicaid Services includes, in addition to the face-to-face time of a patient visit (documented in the note above) non-face-to-face time: obtaining and reviewing outside history, ordering and reviewing medications, tests or procedures, care coordination (communications with other health care professionals or caregivers) and documentation in the medical record.

## 2022-12-03 ENCOUNTER — Ambulatory Visit
Admission: RE | Admit: 2022-12-03 | Discharge: 2022-12-03 | Disposition: A | Discharge status: Home / Self Care | Payer: Medicare Other | Source: Ambulatory Visit | Attending: Hematology and Oncology | Admitting: Hematology and Oncology

## 2022-12-03 ENCOUNTER — Encounter: Payer: Self-pay | Admitting: Hematology and Oncology

## 2022-12-03 DIAGNOSIS — Z1231 Encounter for screening mammogram for malignant neoplasm of breast: Secondary | ICD-10-CM

## 2022-12-04 MED ORDER — PALBOCICLIB 75 MG PO TABS: 75.0000 mg | ORAL_TABLET | Freq: Every day | ORAL | 3 refills | Status: DC

## 2022-12-04 NOTE — Telephone Encounter (Signed)
Oral Oncology Pharmacist Encounter  Prescription refill for Ibrance sent to [facility] in error. Patient enrolled in manufacturer assistance and receives medication through [employer]. Prescription redirected to [facility].  Of note, patients medication switched from capsules to tablets (ok per MD) due to PPIs.   Drema Halon, PharmD Hematology/Oncology Clinical Pharmacist [facility] [phone] 12/04/2022 9:21 AM

## 2022-12-06 ENCOUNTER — Encounter (HOSPITAL_COMMUNITY)
Admission: RE | Admit: 2022-12-06 | Discharge: 2022-12-06 | Disposition: A | Discharge status: Home / Self Care | Payer: Medicare Other | Source: Ambulatory Visit | Attending: Hematology and Oncology | Admitting: Hematology and Oncology

## 2022-12-06 DIAGNOSIS — C7800 Secondary malignant neoplasm of unspecified lung: Secondary | ICD-10-CM | POA: Insufficient documentation

## 2022-12-06 DIAGNOSIS — C50911 Malignant neoplasm of unspecified site of right female breast: Secondary | ICD-10-CM | POA: Diagnosis present

## 2022-12-06 LAB — GLUCOSE, CAPILLARY: Glucose-Capillary: 100 mg/dL — ABNORMAL HIGH (ref 70–99)

## 2022-12-06 MED ORDER — FLUDEOXYGLUCOSE F - 18 (FDG) INJECTION
6.9000 | Freq: Once | INTRAVENOUS | Status: AC
  Administered 2022-12-06: 6.7 via INTRAVENOUS

## 2022-12-09 ENCOUNTER — Inpatient Hospital Stay (HOSPITAL_BASED_OUTPATIENT_CLINIC_OR_DEPARTMENT_OTHER): Payer: Medicare Other | Admitting: Hematology and Oncology

## 2022-12-09 ENCOUNTER — Encounter: Payer: Self-pay | Admitting: Hematology and Oncology

## 2022-12-09 DIAGNOSIS — I10 Essential (primary) hypertension: Secondary | ICD-10-CM

## 2022-12-09 DIAGNOSIS — C7801 Secondary malignant neoplasm of right lung: Secondary | ICD-10-CM | POA: Diagnosis not present

## 2022-12-09 DIAGNOSIS — Z170 Estrogen receptor positive status [ER+]: Secondary | ICD-10-CM

## 2022-12-09 DIAGNOSIS — C50411 Malignant neoplasm of upper-outer quadrant of right female breast: Secondary | ICD-10-CM

## 2022-12-09 DIAGNOSIS — M818 Other osteoporosis without current pathological fracture: Secondary | ICD-10-CM

## 2022-12-09 DIAGNOSIS — C778 Secondary and unspecified malignant neoplasm of lymph nodes of multiple regions: Secondary | ICD-10-CM

## 2022-12-09 DIAGNOSIS — C7802 Secondary malignant neoplasm of left lung: Secondary | ICD-10-CM | POA: Diagnosis not present

## 2022-12-09 DIAGNOSIS — Z79811 Long term (current) use of aromatase inhibitors: Secondary | ICD-10-CM

## 2022-12-09 DIAGNOSIS — Z86718 Personal history of other venous thrombosis and embolism: Secondary | ICD-10-CM

## 2022-12-09 DIAGNOSIS — E611 Iron deficiency: Secondary | ICD-10-CM

## 2022-12-09 NOTE — Progress Notes (Signed)
Nicole [name]  [phone] [phone]    ID: Nicole [name]   DOB: 04/22/1956  MR#: [id number]  YKZ#:[id number]  Patient Care Team: Benay Pike, MD as PCP - General (Hematology and Oncology) Roosevelt Locks, CRNP as Nurse Practitioner (Nurse Practitioner) Rolm Bookbinder, MD as Consulting Physician (General Surgery) Laurence Spates, MD (Inactive) as Consulting Physician (Gastroenterology) [name], MD as Consulting Physician (Gastroenterology)  CHIEF COMPLAINT:  Metastatic Breast Cancer (s/p right mastectomy)  CURRENT TREATMENT: [facility] and Faslodex   INTERVAL HISTORY:  Nicole [name] returns today for follow-up of her estrogen receptor positive stage IV breast cancer.   She is here for telephone follow up to review PET results.  Rest of the pertinent 10 point ROS reviewed and negative  COVID 19 VACCINATION STATUS: Status post [place] x2+ booster August 2021   BREAST CANCER HISTORY: From Dr. Collier Salina Rubin's 07/25/2010 note:  "[name] has not had any significant medical intervention for some time.  Her last mammogram was about seven years ago.  She noted a right breast mass about a year ago and did not seek immediate medical attention for this.  She ultimately, after some time, admitted this to her husband and self-referred herself for intervention.  She had a mammogram on 07/17/2010 with an ultrasound of the right breast.  This showed a lobulated mass upper right outer quadrant measuring at least 6.3 x 7.3 cm and large right axillary lymph nodes were also noted.  There was skin thickening overlying the mass.  On physical exam, this mass was about an 8 cm with skin dimpling noted. Discolored area in the skin over the mass, fullness in the axilla.  Biopsy was recommended, which took place on 07/17/2010.  Pathology showed invasive ductal cancer involving both lymph node and breast.  The HER-2 was not amplified.  ER and PR both positive at 100% and 6%  respectively.  Proliferative index was 12% involving both the lymph node and breast mass.  An MRI scan of both breasts was performed on 07/22/2010, essentially which showed a large heterogeneously enhancing mass of the right breast with washout kinetics measuring 6.4 x 4.4 x 5.0 cm.  Numerous satellite nodules were seen throughout the dominant mass.  There were several suspicious nodules in the upper central and upper inner quadrant of the right breast.  There were two enhancing subcentimeter nodules seen in the right pectoralis muscle.  No obvious chest wall nodules were seen; however, there was seen some metastatic adenopathy in the mediastinum as well as hila with a 2.4 x 2.0 cm mediastinal lymph node as well as 1.2 x 3.0 cm subcarinal lymph node.  There was a right hilar and probable left hilar adenopathy.  Bilateral pulmonary nodules were seen and a right T2 lesion was seen anterior right liver as well."  Her subsequent history is as detailed below.   PAST MEDICAL HISTORY: Past Medical History:  Diagnosis Date   Anemia    low iron   Arthritis    Blood transfusion 2012   Breast CA ([place])    breast ca dx 06/2010, stage 4, right , Lung Metasis.Surgery, Chemo, Radiation   Breast cancer ([place])    Clot    jugular  ?2015   Depression    Esophageal varices ([place])    Headache    Mirgraine- rare since menopause   Heart murmur    mild, no cardiologist, from birth   Hematemesis 08/2017   History of kidney stones    no pain- shows  in CT- small 1 mm bilaterally- kidney   Hypertension    IBD (inflammatory bowel disease)    resolved   Idiopathic cirrhosis ([place])    pt denies   Peripheral vascular disease ([place]) few yrs ago   clot in right jugular   Portal hypertension ([place])    Radiation 07/21/12-09/07/12   5940 cGy   Restless legs     PAST SURGICAL HISTORY: Past Surgical History:  Procedure Laterality Date   BIOPSY  08/21/2020   Procedure: BIOPSY;  Surgeon: [name], MD;  [facility]: [facility]  ENDOSCOPY;  Service: Gastroenterology;;   COLONOSCOPY  2016   DILATION AND CURETTAGE OF UTERUS  2004?   ESOPHAGEAL BANDING  09/04/2017   Procedure: ESOPHAGEAL BANDING;  Surgeon: [name], MD;  Location: [facility] ENDOSCOPY;  Service: Gastroenterology;;   ESOPHAGEAL BANDING  11/27/2017   Procedure: ESOPHAGEAL BANDING;  Surgeon: [name], MD;  Location: [place];  Service: Gastroenterology;;   ESOPHAGEAL BANDING N/A 04/28/2018   Procedure: ESOPHAGEAL BANDING;  Surgeon: [name], MD;  [facility] ENDOSCOPY;  Service: Gastroenterology;  Laterality: N/A;   ESOPHAGEAL BANDING  11/17/2018   Procedure: ESOPHAGEAL BANDING;  Surgeon: [name], MD;  Location: [facility] ENDOSCOPY;  Service: Gastroenterology;;   ESOPHAGEAL BANDING  08/16/2019   Procedure: ESOPHAGEAL BANDING;  Surgeon: [name], MD;  Location: [facility] ENDOSCOPY;  Service: Gastroenterology;;   ESOPHAGEAL BANDING N/A 08/21/2020   Procedure: ESOPHAGEAL BANDING;  Surgeon: [name], MD;  [facility] ENDOS[facility];  Service: Gastroenterology;  Laterality: N/A;   ESOPHAGOGASTRODUODENOSCOPY N/A 04/28/2018   Procedure: ESOPHAGOGASTRODUODENOSCOPY (EGD);  Surgeon: [name], MD;  Location: [facility] ENDOSCOPY;  Service: Gastroenterology;  Laterality: N/A;   ESOPHAGOGASTRODUODENOSCOPY N/A 11/17/2018   Procedure: ESOPHAGOGASTRODUODENOSCOPY (EGD);  Surgeon: [name], MD;  Location: [facility] ENDOSCOPY;  Service: Gastroenterology;  Laterality: N/A;   ESOPHAGOGASTRODUODENOSCOPY N/A 08/13/2022   Procedure: ESOPHAGOGASTRODUODENOSCOPY (EGD);  Surgeon: [name], MD;  Location: [facility] ENDOSCOPY;  Service: Gastroenterology;  Laterality: N/A;   ESOPHAGOGASTRODUODENOSCOPY (EGD) WITH PROPOFOL N/A 04/11/2016   Procedure: ESOPHAGOGASTRODUODENOSCOPY (EGD) WITH PROPOFOL;  Surgeon: Clarene Essex, MD;  [facility];  Service: Endoscopy;  Laterality: N/A;   ESOPHAGOGASTRODUODENOSCOPY (EGD) WITH PROPOFOL N/A 07/08/2016   Procedure: ESOPHAGOGASTRODUODENOSCOPY (EGD) WITH PROPOFOL;   Surgeon: Laurence Spates, MD;  Location: [place];  Service: Endoscopy;  Laterality: N/A;   ESOPHAGOGASTRODUODENOSCOPY (EGD) WITH PROPOFOL N/A 03/03/2017   Procedure: ESOPHAGOGASTRODUODENOSCOPY (EGD) WITH PROPOFOL;  Surgeon: Laurence Spates, MD;  Location: [place];  Service: Endoscopy;  Laterality: N/A;   ESOPHAGOGASTRODUODENOSCOPY (EGD) WITH PROPOFOL N/A 09/04/2017   Procedure: ESOPHAGOGASTRODUODENOSCOPY (EGD) WITH PROPOFOL;  Surgeon: [name], MD;  Location: [place];  Service: Gastroenterology;  Laterality: N/A;   ESOPHAGOGASTRODUODENOSCOPY (EGD) WITH PROPOFOL N/A 11/27/2017   Procedure: ESOPHAGOGASTRODUODENOSCOPY (EGD);  Surgeon: [name], MD;  Location: [place];  Service: Gastroenterology;  Laterality: N/A;   ESOPHAGOGASTRODUODENOSCOPY (EGD) WITH PROPOFOL N/A 08/16/2019   Procedure: ESOPHAGOGASTRODUODENOSCOPY (EGD) WITH PROPOFOL;  Surgeon: [name], MD;  [facility];  Service: Gastroenterology;  Laterality: N/A;   ESOPHAGOGASTRODUODENOSCOPY (EGD) WITH PROPOFOL N/A 08/21/2020   Procedure: ESOPHAGOGASTRODUODENOSCOPY (EGD) WITH PROPOFOL;  Surgeon: [name], MD;  [facility];  Service: Gastroenterology;  Laterality: N/A;   ESOPHAGOGASTRODUODENOSCOPY (EGD) WITH PROPOFOL N/A 08/24/2021   Procedure: ESOPHAGOGASTRODUODENOSCOPY (EGD) WITH PROPOFOL;  Surgeon: [name], MD;  [facility];  Service: Gastroenterology;  Laterality: N/A;   GASTRIC VARICES BANDING N/A 08/24/2021   Procedure: GASTRIC VARICES BANDING;  Surgeon: [name], MD;  [facility];  Service: Gastroenterology;  Laterality: N/A;  GASTRIC VARICES BANDING N/A 08/13/2022   Procedure: GASTRIC VARICES BANDING;  Surgeon: [name], MD;  [facility];  Service: Gastroenterology;  Laterality: N/A;   HEMOSTASIS CLIP PLACEMENT  08/21/2020   Procedure: HEMOSTASIS CLIP PLACEMENT;  Surgeon: [name], MD;  [facility];  Service: Gastroenterology;;   INTERCOSTAL  NERVE BLOCK Left 07/01/2022   Procedure: INTERCOSTAL NERVE BLOCK;  Surgeon: [name], MD;  Location: [place];  Service: Thoracic;  Laterality: Left;   IR GENERIC HISTORICAL  05/15/2016   IR RADIOLOGIST EVAL & MGMT 05/15/2016 Corrie Mckusick, DO [facility] INTERV RAD   IR GENERIC HISTORICAL  01/14/2017   IR US GUIDE VASC ACCESS RIGHT 01/14/2017 Greggory Keen, MD [facility]   IR GENERIC HISTORICAL  01/14/2017   IR VENOGRAM HEPATIC W HEMODYNAMIC EVALUATION 01/14/2017 Greggory Keen, MD [facility]   IR RADIOLOGIST EVAL & MGMT  06/12/2017   IR RADIOLOGIST EVAL & MGMT  08/11/2018   IR RADIOLOGIST EVAL & MGMT  08/25/2019   IR RADIOLOGIST EVAL & MGMT  08/22/2020   IR RADIOLOGIST EVAL & MGMT  03/11/2022   MASTECTOMY Right 06/02/12   right breast with lymph node removal   NODE DISSECTION Left 07/01/2022   Procedure: NODE DISSECTION;  Surgeon: [name], MD;  Location: [facility] OR;  Service: Thoracic;  Laterality: Left;   PORT-A-CATH REMOVAL     PORTACATH PLACEMENT  2011   left side   RADIOLOGY WITH ANESTHESIA N/A 04/13/2016   Procedure: RADIOLOGY WITH ANESTHESIA;  Surgeon: Corrie Mckusick, DO;  Location: [place];  Service: Anesthesiology;  Laterality: N/A;   VIDEO BRONCHOSCOPY WITH ENDOBRONCHIAL NAVIGATION N/A 07/01/2022   Procedure: VIDEO BRONCHOSCOPY WITH ENDOBRONCHIAL NAVIGATION;  Surgeon: [name], MD;  [facility];  Service: Thoracic;  Laterality: N/A;   XI ROBOTIC ASSISTED THORACOSCOPY- SEGMENTECTOMY Left 07/01/2022   Procedure: XI ROBOTIC ASSISTED THORACOSCOPY-LEFT LOWER LOBE BASILAR SEGMENTECTOMY;  Surgeon: [name], MD;  [facility] OR;  Service: Thoracic;  Laterality: Left;    FAMILY HISTORY Family History  Problem Relation Age of Onset   COPD Mother    AAA (abdominal aortic aneurysm) Mother    COPD Father    Cancer Neg Hx   The patient's father died at the age of 9 from heart disease. Patient's mother died at the age of 39 status post CABG. The patient had no  brothers, 2 sisters. There is no history of breast or ovarian cancer in the family.   GYNECOLOGIC HISTORY: Updated May 2018 Menarche age 10, she is GX P0. She does not recall when she went through menopause. She never took hormone replacement.   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 Husband Nicole [name] is retired from [employer]. They have no children and no pets. They attend a local [employer].    ADVANCED DIRECTIVES: Not in place   HEALTH MAINTENANCE: Social History   Tobacco Use   Smoking status: Never   Smokeless tobacco: Never  Vaping Use   Vaping Use: Never used  Substance Use Topics   Alcohol use: No   Drug use: No     Colonoscopy: Never  PAP: Remote  Bone density: 09/27/2013, osteoporosis with a T score of -2.8  Lipid panel: Not on file    No Active Allergies   Current Outpatient Medications  Medication Sig Dispense Refill   denosumab (PROLIA) 60 MG/ML SOLN injection  Inject 60 mg into the skin every 6 (six) months. Administer in upper arm, thigh, or abdomen     ELIQUIS 5 MG TABS tablet TAKE 1 TABLET BY MOUTH TWICE A DAY 60 tablet 2   folic acid (FOLVITE) 1 MG tablet Take 1 mg by mouth daily.     Milk Thistle 1000 MG CAPS Take 1,000 mg by mouth daily.     palbociclib (IBRANCE) 75 MG tablet Take 1 tablet (75 mg total) by mouth daily. Take for 21 days on, 7 days off, repeat every 28 days. 21 tablet 3   pantoprazole (PROTONIX) 40 MG tablet Take 1 tablet (40 mg total) by mouth daily. 60 tablet 0   propranolol (INDERAL) 20 MG tablet Take 1 tablet (20 mg total) by mouth 2 (two) times daily. 60 tablet 0   venlafaxine XR (EFFEXOR-XR) 150 MG 24 hr capsule Take 1 capsule (150 mg total) by mouth daily with breakfast. 90 capsule 1   No current facility-administered medications for this visit.   Facility-Administered Medications Ordered in Other Visits  Medication  Dose Route Frequency Provider Last Rate Last Admin   denosumab (PROLIA) injection 60 mg  60 mg Subcutaneous Once Magrinat, Virgie Dad, MD        OBJECTIVE: white [name] who appears younger than stated age  There were no vitals filed for this visit.    PE not done, telephone visit.  LAB RESULTS:  CMP     Component Value Date/Time   NA 139 09/23/2022 1327   NA 142 11/17/2017 1355   K 4.0 09/23/2022 1327   K 4.4 11/17/2017 1355   CL 110 09/23/2022 1327   CL 109 (H) 04/20/2013 0859   CO2 22 09/23/2022 1327   CO2 22 11/17/2017 1355   GLUCOSE 94 09/23/2022 1327   GLUCOSE 86 11/17/2017 1355   GLUCOSE 91 04/20/2013 0859   BUN 18 09/23/2022 1327   BUN 12.5 11/17/2017 1355   CREATININE 1.07 (H) 09/23/2022 1327   CREATININE 1.1 11/17/2017 1355   CALCIUM 8.4 (L) 09/23/2022 1327   CALCIUM 9.2 11/17/2017 1355   PROT 6.7 09/23/2022 1327   PROT 7.0 11/17/2017 1355   ALBUMIN 4.0 09/23/2022 1327   ALBUMIN 3.8 11/17/2017 1355   AST 14 (L) 09/23/2022 1327   AST 23 11/17/2017 1355   ALT 11 09/23/2022 1327   ALT 22 11/17/2017 1355   ALKPHOS 54 09/23/2022 1327   ALKPHOS 55 11/17/2017 1355   BILITOT 0.7 09/23/2022 1327   BILITOT 0.49 11/17/2017 1355   GFRNONAA 57 (L) 09/23/2022 1327   GFRNONAA 53 (L) 05/09/2016 1104   GFRAA 53 (L) 06/12/2020 1146   GFRAA 55 (L) 12/13/2019 1200   GFRAA 61 05/09/2016 1104    No results found for: "TOTALPROTELP", "ALBUMINELP", "A1GS", "A2GS", "BETS", "BETA2SER", "GAMS", "MSPIKE", "SPEI"  Lab Results  Component Value Date   WBC 2.4 (L) 12/02/2022   NEUTROABS 1.8 12/02/2022   HGB 10.7 (L) 12/02/2022   HCT 32.5 (L) 12/02/2022   MCV 93.1 12/02/2022   PLT 68 (L) 12/02/2022    Lab Results  Component Value Date   LABCA2 18 08/21/2012    No components found for: "YSAYTK160"  No results for input(s): "INR" in the last 168 hours.  Lab Results  Component Value Date   LABCA2 18 08/21/2012    No results found for: "FUX323"  No results found for:  "CAN125"  No results found for: "FTD322"  Lab Results  Component Value Date   CA2729 26.9 12/13/2019  No components found for: "HGQUANT"  No results found for: "CEA1", "CEA" / No results found for: "CEA1", "CEA"   No results found for: "AFPTUMOR"  No results found for: "CHROMOGRNA"  No results found for: "KPAFRELGTCHN", "LAMBDASER", "KAPLAMBRATIO" (kappa/lambda light chains)  No results found for: "HGBA", "HGBA2QUANT", "HGBFQUANT", "HGBSQUAN" (Hemoglobinopathy evaluation)   Lab Results  Component Value Date   LDH 195 08/21/2012    Lab Results  Component Value Date   IRON 27 (L) 04/11/2016   TIBC 340 04/11/2016   IRONPCTSAT 8 (L) 04/11/2016   (Iron and TIBC)  Lab Results  Component Value Date   FERRITIN 18 04/11/2016    Urinalysis    Component Value Date/Time   COLORURINE YELLOW 06/28/2022 1122   APPEARANCEUR CLEAR 06/28/2022 1122   LABSPEC 1.015 06/28/2022 1122   LABSPEC 1.010 04/29/2016 1118   PHURINE 6.0 06/28/2022 1122   GLUCOSEU NEGATIVE 06/28/2022 1122   GLUCOSEU Negative 04/29/2016 1118   HGBUR NEGATIVE 06/28/2022 1122   BILIRUBINUR NEGATIVE 06/28/2022 1122   BILIRUBINUR Negative 04/29/2016 1118   KETONESUR NEGATIVE 06/28/2022 1122   PROTEINUR NEGATIVE 06/28/2022 1122   UROBILINOGEN 0.2 04/29/2016 1118   NITRITE NEGATIVE 06/28/2022 1122   LEUKOCYTESUR NEGATIVE 06/28/2022 1122   LEUKOCYTESUR Trace 04/29/2016 1118    STUDIES: NM PET Image Restage (PS) Skull Base to Thigh (F-18 FDG)  Result Date: 12/08/2022 CLINICAL DATA:  Subsequent treatment strategy for metastatic breast cancer. EXAM: NUCLEAR MEDICINE PET SKULL BASE TO THIGH TECHNIQUE: 6.7 mCi F-18 FDG was injected intravenously. Full-ring PET imaging was performed from the skull base to thigh after the radiotracer. CT data was obtained and used for attenuation correction and anatomic localization. Fasting blood glucose: 100 mg/dl COMPARISON:  Multiple exams, including 05/24/2022 FINDINGS:  Mediastinal blood pool activity: SUV max 2.0 Liver activity: SUV max NA NECK: No significant abnormal hypermetabolic activity in this region. Incidental CT findings: Left common carotid atherosclerotic calcification. CHEST: Interval resection of the left lower lobe pulmonary nodule. Mild focal hypermetabolic activity at the right hilum, maximum SUV 4.1, previously 4.0. Incidental CT findings: Right mastectomy. New mildly loculated small left pleural effusion. Stable mildly loculated moderate right pleural effusion. Associated passive atelectasis. Aortic and branch vessel atherosclerosis. Subpleural reticulation anteriorly in the right lung possibly from prior radiation therapy. ABDOMEN/PELVIS: Attenuation corrected activity along a coil in a gastric varix is not present on the non attenuation corrected images, indicating that this activity is artifactual related to the high density of the coil. Incidental CT findings: Stable appearance the liver with some lobulation of the right hepatic lobe and prominence of the lateral segment left hepatic lobe suspicious for cirrhosis. Atherosclerosis is present, including aortoiliac atherosclerotic disease. Nonobstructive right nephrolithiasis. Mild pelvic ascites. SKELETON: No hypermetabolic osseous activity. Incidental CT findings: Old healed left posterior eighth rib fracture. Degenerative arthropathy of both hips. L1-L2 compressions with some geographic hyperdensity in the L2 vertebral body which is not currently hypermetabolic, and not changed from 05/24/2022. There is some transverse sclerosis in the sacrum at the S3-4 level, possibly from an old sacral fracture. IMPRESSION: 1. Interval resection of the left lower lobe pulmonary nodule. 2. Stable mild focal hypermetabolic activity at the right hilum, maximum SUV 4.1, previously 4.0. Although nonspecific a small persistent focus of malignancy in this vicinity is not excluded. 3. Stable mildly loculated moderate right  pleural effusion. New mildly loculated small left pleural effusion. 4. Stable appearance of the liver with lobulation of the right hepatic lobe and prominence of the lateral segment left  hepatic lobe suspicious for cirrhosis. Mild pelvic ascites. 5. L1 and L2 compressions with some geographic hyperdensity in the L2 vertebral body which is not currently hypermetabolic, and not changed from 05/24/2022. This may reflect a treated metastatic lesion or mixed density related to a prior remote benign compression fracture. 6. Old healed left posterior eighth rib fracture. Suspected old sacral fracture at the S3-4 level. 7. Nonobstructive right nephrolithiasis. Aortic Atherosclerosis (ICD10-I70.0). Electronically Signed   By: Van Clines M.D.   On: 12/08/2022 12:56   MM 3D SCREEN BREAST UNI LEFT  Result Date: 12/04/2022 CLINICAL DATA:  Screening. EXAM: DIGITAL SCREENING UNILATERAL LEFT MAMMOGRAM WITH CAD AND TOMOSYNTHESIS TECHNIQUE: Left screening digital craniocaudal and mediolateral oblique mammograms were obtained. Left screening digital breast tomosynthesis was performed. The images were evaluated with computer-aided detection. COMPARISON:  Previous exam(s). ACR Breast Density Category c: The breast tissue is heterogeneously dense, which may obscure small masses. FINDINGS: There are no findings suspicious for malignancy. IMPRESSION: No mammographic evidence of malignancy. A result letter of this screening mammogram will be mailed directly to the patient. RECOMMENDATION: Screening mammogram in one year. (Code:SM-B-01Y) BI-RADS CATEGORY  1: Negative. Electronically Signed   By: Lillia Mountain M.D.   On: 12/04/2022 11:06   COMPARISON: Abdominal ultrasound November 05, 2021   TECHNIQUE:  Real time limited abdomen ultrasound was performed.   IMAGE QUALITY: Satisfactory, of diagnostic value.   FINDINGS:   Limited sonographic views of the abdomen demonstrates mild abdominal ascites. There is no large pocket to  safely drain.    IMPRESSION:  IMPRESSION:  1. Mild abdominal ascites.  2. No large enough pocket to safely drain.    ASSESSMENT: 66 y.o. Nicole [name] presenting June 2011 with stage IV breast cancer involving the right breast upper outer quadrant, right axilla, mediastinal lymph nodes, and both lungs, but not the brain, liver, or bones  (1) positive right breast and right axillary lymph node biopsies 07/17/2010 of a clinical T4 N2 M1 invasive ductal carcinoma, grade 1, estrogen receptor 100% positive, progesterone receptor 6% positive, with an MIB-1 of 12%, and no HER-2 amplification.  (2) participated in Phase II tessetaxel study, receiving 2 cycles complicated by thrombocytopenia, anemia requiring transfusion, transaminase elevation, and afebrile neutropenia. Off-study as of September 2011. Chest CT scan September 2011 did show evidence of response.  (3) on letrozole as of October 2011, with continuing response and good tolerance   (4) 06/02/2012 underwent right mastectomy and axillary lymph node sampling (3 lymph nodes removed, all with viable cancer as well as evidence of treatment effect) for a ypT2 ypN1-2 invasive ductal carcinoma, grade 1, 98% estrogen receptor positive, progesterone receptor negative, with no HER-2 amplification  (5) left jugular vein DVT documented 06/16/2012; left sided Port-A-Cath removed mid July 2015; Coumadin stopped 09/07/2014.  (a) Left brachiocephalic v. slightly narrowed, with some Left lower neck collateralization noted on chest CT December 2015  (6) osteoporosis, was on alendronate, switched to denosumab/Prolia 11/07/2016  (a) bone density in 10/2018 shows osteopenia T score of -2.2  (7) portal hypertension, with splenomegaly, gastric varices, and intestinal edema  (a)  mild persistent leukopenia and thrombocytopenia likely due to splenomegaly  (b) iron deficiency secondary to above, status post Feraheme x2 December 2018  (8) restaging  studies:  (a) chest CT 12/26/2017 shows a 4.5 mm left lower lobe pulmonary nodule which is unchanged.  There was no other evidence of active disease  (b) restaging chest CT in 05/2019 shows no change in bilateral pulmonary nodules, prominent  mediastinal lymph nodes are stable  (9) CT scan of the abdomen 11/03/2021 shows thrombus in the portal, splenic and superior mesenteric vein  (A) Heparin started 11/03/2021  10. PET scan 7/98/9211, low metabolic activity of right lower pulmonary nodule. No change in size of CT 02/27/2022, Carcinoma with mucinous features consistent with metastatic breast carcinoma, negative margins, no LN involvement.  Prognostics showed ER +100% strong staining, PR +20% staining, Ki-67 of 10% and HER2 2+ by IHC and negative by FISH  PLAN:  Given metastatic breast cancer progression on letrozole, we discussed about adding Faslodex with Ibrance.   She is currently on this combination and is tolerating it very well.   She has baseline thrombocytopenia due to splenomegaly, this has gotten worse, hence we changed the Ibrance to 75 mg po daily She also continues on Prolia every 6 months for osteoporosis, last bone density in March 2023 with a T score of -2.4 improved.  She is here for a telephone visit to review PET results. PET overall showed stable disease. At this time we plan to continue current combination with Ibrance (75 mg) and faslodex. RTC in 4 weeks for labs, 8 weeks with me. Total encounter time 12 minutes.  I connected with  Nicole [name] on 12/09/22 by a telephone application and verified that I am speaking with the correct person using two identifiers.   I discussed the limitations of evaluation and management by telemedicine. The patient expressed understanding and agreed to proceed.  *Total Encounter Time as defined by the Centers for Medicare and Medicaid Services includes, in addition to the face-to-face time of a patient visit (documented in the note above)  non-face-to-face time: obtaining and reviewing outside history, ordering and reviewing medications, tests or procedures, care coordination (communications with other health care professionals or caregivers) and documentation in the medical record.

## 2022-12-31 ENCOUNTER — Other Ambulatory Visit: Payer: Self-pay

## 2022-12-31 ENCOUNTER — Inpatient Hospital Stay: Payer: Medicare Other

## 2022-12-31 ENCOUNTER — Inpatient Hospital Stay: Payer: Medicare Other | Admitting: Hematology and Oncology

## 2022-12-31 ENCOUNTER — Inpatient Hospital Stay: Payer: Medicare Other | Attending: Hematology and Oncology

## 2022-12-31 ENCOUNTER — Encounter: Payer: Self-pay | Admitting: Hematology and Oncology

## 2022-12-31 VITALS — BP 119/75 | HR 86 | Temp 97.9°F | Resp 16

## 2022-12-31 DIAGNOSIS — C7801 Secondary malignant neoplasm of right lung: Secondary | ICD-10-CM | POA: Diagnosis not present

## 2022-12-31 DIAGNOSIS — Z5111 Encounter for antineoplastic chemotherapy: Secondary | ICD-10-CM | POA: Insufficient documentation

## 2022-12-31 DIAGNOSIS — C7802 Secondary malignant neoplasm of left lung: Secondary | ICD-10-CM | POA: Insufficient documentation

## 2022-12-31 DIAGNOSIS — C778 Secondary and unspecified malignant neoplasm of lymph nodes of multiple regions: Secondary | ICD-10-CM | POA: Diagnosis not present

## 2022-12-31 DIAGNOSIS — Z170 Estrogen receptor positive status [ER+]: Secondary | ICD-10-CM | POA: Insufficient documentation

## 2022-12-31 DIAGNOSIS — C50411 Malignant neoplasm of upper-outer quadrant of right female breast: Secondary | ICD-10-CM | POA: Diagnosis not present

## 2022-12-31 DIAGNOSIS — C50911 Malignant neoplasm of unspecified site of right female breast: Secondary | ICD-10-CM

## 2022-12-31 DIAGNOSIS — M810 Age-related osteoporosis without current pathological fracture: Secondary | ICD-10-CM

## 2022-12-31 LAB — CBC WITH DIFFERENTIAL/PLATELET
Abs Immature Granulocytes: 0.01 10*3/uL (ref 0.00–0.07)
Basophils Absolute: 0 10*3/uL (ref 0.0–0.1)
Basophils Relative: 1 %
Eosinophils Absolute: 0 10*3/uL (ref 0.0–0.5)
Eosinophils Relative: 2 %
HCT: 32.4 % — ABNORMAL LOW (ref 36.0–46.0)
Hemoglobin: 10.9 g/dL — ABNORMAL LOW (ref 12.0–15.0)
Immature Granulocytes: 0 %
Lymphocytes Relative: 14 %
Lymphs Abs: 0.4 10*3/uL — ABNORMAL LOW (ref 0.7–4.0)
MCH: 32.5 pg (ref 26.0–34.0)
MCHC: 33.6 g/dL (ref 30.0–36.0)
MCV: 96.7 fL (ref 80.0–100.0)
Monocytes Absolute: 0.2 10*3/uL (ref 0.1–1.0)
Monocytes Relative: 8 %
Neutro Abs: 2 10*3/uL (ref 1.7–7.7)
Neutrophils Relative %: 75 %
Platelets: 95 10*3/uL — ABNORMAL LOW (ref 150–400)
RBC: 3.35 MIL/uL — ABNORMAL LOW (ref 3.87–5.11)
RDW: 21.5 % — ABNORMAL HIGH (ref 11.5–15.5)
WBC: 2.7 10*3/uL — ABNORMAL LOW (ref 4.0–10.5)
nRBC: 0 % (ref 0.0–0.2)

## 2022-12-31 MED ORDER — FULVESTRANT 250 MG/5ML IM SOSY
500.0000 mg | PREFILLED_SYRINGE | Freq: Once | INTRAMUSCULAR | Status: AC
  Administered 2022-12-31: 500 mg via INTRAMUSCULAR
  Filled 2022-12-31: qty 10

## 2023-01-01 ENCOUNTER — Encounter: Payer: Self-pay | Admitting: Hematology and Oncology

## 2023-01-09 ENCOUNTER — Encounter: Payer: Self-pay | Admitting: Hematology and Oncology

## 2023-01-09 ENCOUNTER — Telehealth: Payer: Self-pay | Admitting: Pharmacy Technician

## 2023-01-09 ENCOUNTER — Other Ambulatory Visit (HOSPITAL_COMMUNITY): Payer: Self-pay

## 2023-01-09 NOTE — Telephone Encounter (Signed)
Oral Oncology Patient Advocate Encounter  Application placed on hold until further notice. Patient approved for funding from [employer]. I will re-open at a later time if needed,  I have spoken with the patient.  Georgi Tuel, CPhT-Adv Oncology Pharmacy Patient Advocate [facility] Direct Number: [phone]  Fax: [phone]  

## 2023-01-09 NOTE — Telephone Encounter (Signed)
Oral Oncology Patient Advocate Encounter  Was successful in securing patient a $15,000 grant from [employer] to provide copayment coverage for [place].  This will keep the out of pocket expense at $0.     [employer] ID: [id number]  I have spoken with the patient.   The billing information is as follows and has been shared with [facility].    RxBin: [id number] PCN: [id number] Member ID: [id number] Group ID: [id number] Dates of Eligibility: 12/10/22 through 12/10/23  Fund:  Churchill, CPhT-Adv Oncology Pharmacy Patient [place] Direct Number: [phone]  Fax: [phone]

## 2023-01-17 ENCOUNTER — Other Ambulatory Visit: Payer: Self-pay | Admitting: Hematology and Oncology

## 2023-01-27 ENCOUNTER — Inpatient Hospital Stay: Payer: Medicare Other

## 2023-01-27 ENCOUNTER — Other Ambulatory Visit: Payer: Self-pay

## 2023-01-27 ENCOUNTER — Encounter: Payer: Self-pay | Admitting: Hematology and Oncology

## 2023-01-27 ENCOUNTER — Inpatient Hospital Stay (HOSPITAL_BASED_OUTPATIENT_CLINIC_OR_DEPARTMENT_OTHER): Payer: Medicare Other | Admitting: Hematology and Oncology

## 2023-01-27 VITALS — BP 129/77 | HR 100 | Temp 99.0°F | Resp 16 | Ht 64.0 in | Wt 137.7 lb

## 2023-01-27 DIAGNOSIS — C50411 Malignant neoplasm of upper-outer quadrant of right female breast: Secondary | ICD-10-CM

## 2023-01-27 DIAGNOSIS — Z170 Estrogen receptor positive status [ER+]: Secondary | ICD-10-CM | POA: Diagnosis not present

## 2023-01-27 DIAGNOSIS — M810 Age-related osteoporosis without current pathological fracture: Secondary | ICD-10-CM

## 2023-01-27 DIAGNOSIS — C50911 Malignant neoplasm of unspecified site of right female breast: Secondary | ICD-10-CM

## 2023-01-27 DIAGNOSIS — Z5111 Encounter for antineoplastic chemotherapy: Secondary | ICD-10-CM | POA: Diagnosis not present

## 2023-01-27 LAB — CBC WITH DIFFERENTIAL/PLATELET
Abs Immature Granulocytes: 0.01 10*3/uL (ref 0.00–0.07)
Basophils Absolute: 0 10*3/uL (ref 0.0–0.1)
Basophils Relative: 1 %
Eosinophils Absolute: 0.1 10*3/uL (ref 0.0–0.5)
Eosinophils Relative: 2 %
HCT: 33.2 % — ABNORMAL LOW (ref 36.0–46.0)
Hemoglobin: 11.3 g/dL — ABNORMAL LOW (ref 12.0–15.0)
Immature Granulocytes: 0 %
Lymphocytes Relative: 15 %
Lymphs Abs: 0.5 10*3/uL — ABNORMAL LOW (ref 0.7–4.0)
MCH: 33 pg (ref 26.0–34.0)
MCHC: 34 g/dL (ref 30.0–36.0)
MCV: 97.1 fL (ref 80.0–100.0)
Monocytes Absolute: 0.3 10*3/uL (ref 0.1–1.0)
Monocytes Relative: 8 %
Neutro Abs: 2.4 10*3/uL (ref 1.7–7.7)
Neutrophils Relative %: 74 %
Platelets: 111 10*3/uL — ABNORMAL LOW (ref 150–400)
RBC: 3.42 MIL/uL — ABNORMAL LOW (ref 3.87–5.11)
RDW: 18.5 % — ABNORMAL HIGH (ref 11.5–15.5)
WBC: 3.3 10*3/uL — ABNORMAL LOW (ref 4.0–10.5)
nRBC: 0 % (ref 0.0–0.2)

## 2023-01-27 LAB — COMPREHENSIVE METABOLIC PANEL
ALT: 11 U/L (ref 0–44)
AST: 16 U/L (ref 15–41)
Albumin: 3.9 g/dL (ref 3.5–5.0)
Alkaline Phosphatase: 62 U/L (ref 38–126)
Anion gap: 9 (ref 5–15)
BUN: 19 mg/dL (ref 8–23)
CO2: 24 mmol/L (ref 22–32)
Calcium: 9.1 mg/dL (ref 8.9–10.3)
Chloride: 106 mmol/L (ref 98–111)
Creatinine, Ser: 1.26 mg/dL — ABNORMAL HIGH (ref 0.44–1.00)
GFR, Estimated: 47 mL/min — ABNORMAL LOW (ref >60)
Glucose, Bld: 82 mg/dL (ref 70–99)
Potassium: 4.3 mmol/L (ref 3.5–5.1)
Sodium: 139 mmol/L (ref 135–145)
Total Bilirubin: 0.7 mg/dL (ref 0.3–1.2)
Total Protein: 7 g/dL (ref 6.5–8.1)

## 2023-01-27 MED ORDER — FULVESTRANT 250 MG/5ML IM SOSY
500.0000 mg | PREFILLED_SYRINGE | Freq: Once | INTRAMUSCULAR | Status: AC
  Administered 2023-01-27: 500 mg via INTRAMUSCULAR
  Filled 2023-01-27: qty 10

## 2023-01-27 NOTE — Progress Notes (Signed)
[place]  Telephone:(336) [phone] Fax:(336) [phone]    ID: UMI MAINOR   DOB: April 28, 1956  MR#: [id number]  CBJ#:[id number]  Patient Care Team: Benay Pike, MD as PCP - General (Hematology and Oncology) Roosevelt Locks, CRNP as Nurse Practitioner (Nurse Practitioner) Rolm Bookbinder, MD as Consulting Physician (General Surgery) Laurence Spates, MD (Inactive) as Consulting Physician (Gastroenterology) [name], MD as Consulting Physician (Gastroenterology)  CHIEF COMPLAINT:  Metastatic Breast Cancer (s/p right mastectomy)  CURRENT TREATMENT: [facility] and Faslodex   INTERVAL HISTORY:  Nicole [name] returns today for follow-up of her estrogen receptor positive stage IV breast cancer.   She is doing well today. She is compliant with all her medications. She complains of some SOB with exertion. She thinks this is because she is not very active. She otherwise denies any new bone pains. No change in breathing No new bowel issues. No new dental concerns.  Rest of the pertinent 10 point ROS reviewed and negative  COVID 19 VACCINATION STATUS: Status post [place] x2+ booster August 2021   BREAST CANCER HISTORY: From Dr. Collier Salina Rubin's 07/25/2010 note:  "This woman has not had any significant medical intervention for some time.  Her last mammogram was about seven years ago.  She noted a right breast mass about a year ago and did not seek immediate medical attention for this.  She ultimately, after some time, admitted this to her husband and self-referred herself for intervention.  She had a mammogram on 07/17/2010 with an ultrasound of the right breast.  This showed a lobulated mass upper right outer quadrant measuring at least 6.3 x 7.3 cm and large right axillary lymph nodes were also noted.  There was skin thickening overlying the mass.  On physical exam, this mass was about an 8 cm with skin dimpling noted. Discolored area in the skin over the mass, fullness in the axilla.   Biopsy was recommended, which took place on 07/17/2010.  Pathology showed invasive ductal cancer involving both lymph node and breast.  The HER-2 was not amplified.  ER and PR both positive at 100% and 6% respectively.  Proliferative index was 12% involving both the lymph node and breast mass.  An MRI scan of both breasts was performed on 07/22/2010, essentially which showed a large heterogeneously enhancing mass of the right breast with washout kinetics measuring 6.4 x 4.4 x 5.0 cm.  Numerous satellite nodules were seen throughout the dominant mass.  There were several suspicious nodules in the upper central and upper inner quadrant of the right breast.  There were two enhancing subcentimeter nodules seen in the right pectoralis muscle.  No obvious chest wall nodules were seen; however, there was seen some metastatic adenopathy in the mediastinum as well as hila with a 2.4 x 2.0 cm mediastinal lymph node as well as 1.2 x 3.0 cm subcarinal lymph node.  There was a right hilar and probable left hilar adenopathy.  Bilateral pulmonary nodules were seen and a right T2 lesion was seen anterior right liver as well."  Her subsequent history is as detailed below.   PAST MEDICAL HISTORY: Past Medical History:  Diagnosis Date   Anemia    low iron   Arthritis    Blood transfusion 2012   Breast CA ([place])    breast ca dx 06/2010, stage 4, right , Lung Metasis.Surgery, Chemo, Radiation   Breast cancer ([place])    Clot    jugular  ?2015   Depression    Esophageal varices ([place])  Headache    Mirgraine- rare since menopause   Heart murmur    mild, no cardiologist, from birth   Hematemesis 08/2017   History of kidney stones    no pain- shows in CT- small 1 mm bilaterally- kidney   Hypertension    IBD (inflammatory bowel disease)    resolved   Idiopathic cirrhosis ([place])    pt denies   Peripheral vascular disease ([place]) few yrs ago   clot in right jugular   Portal hypertension ([place])    Radiation  07/21/12-09/07/12   5940 cGy   Restless legs     PAST SURGICAL HISTORY: Past Surgical History:  Procedure Laterality Date   BIOPSY  08/21/2020   Procedure: BIOPSY;  Surgeon: [name], MD;  [facility];  Service: Gastroenterology;;   COLONOSCOPY  2016   DILATION AND CURETTAGE OF UTERUS  2004?   ESOPHAGEAL BANDING  09/04/2017   Procedure: ESOPHAGEAL BANDING;  Surgeon: [name], MD;  Location: [facility] ENDOSCOPY;  Service: Gastroenterology;;   ESOPHAGEAL BANDING  11/27/2017   Procedure: ESOPHAGEAL BANDING;  Surgeon: [name], MD;  Location: [place];  Service: Gastroenterology;;   ESOPHAGEAL BANDING N/A 04/28/2018   Procedure: ESOPHAGEAL BANDING;  Surgeon: [name], MD;  [facility];  Service: Gastroenterology;  Laterality: N/A;   ESOPHAGEAL BANDING  11/17/2018   Procedure: ESOPHAGEAL BANDING;  Surgeon: [name], MD;  Location: [facility];  Service: Gastroenterology;;   ESOPHAGEAL BANDING  08/16/2019   Procedure: ESOPHAGEAL BANDING;  Surgeon: [name], MD;  Location: [facility];  Service: Gastroenterology;;   ESOPHAGEAL BANDING N/A 08/21/2020   Procedure: ESOPHAGEAL BANDING;  Surgeon: [name], MD;  [facility];  Service: Gastroenterology;  Laterality: N/A;   ESOPHAGOGASTRODUODENOSCOPY N/A 04/28/2018   Procedure: ESOPHAGOGASTRODUODENOSCOPY (EGD);  Surgeon: [name], MD;  Location: [facility];  Service: Gastroenterology;  Laterality: N/A;   ESOPHAGOGASTRODUODENOSCOPY N/A 11/17/2018   Procedure: ESOPHAGOGASTRODUODENOSCOPY (EGD);  Surgeon: [name], MD;  Location: [facility];  Service: Gastroenterology;  Laterality: N/A;   ESOPHAGOGASTRODUODENOSCOPY N/A 08/13/2022   Procedure: ESOPHAGOGASTRODUODENOSCOPY (EGD);  Surgeon: [name], MD;  Location: [facility];  Service: Gastroenterology;  Laterality: N/A;   ESOPHAGOGASTRODUODENOSCOPY (EGD) WITH PROPOFOL N/A 04/11/2016   Procedure: ESOPHAGOGASTRODUODENOSCOPY (EGD) WITH PROPOFOL;  Surgeon:  Clarene Essex, MD;  [facility];  Service: Endoscopy;  Laterality: N/A;   ESOPHAGOGASTRODUODENOSCOPY (EGD) WITH PROPOFOL N/A 07/08/2016   Procedure: ESOPHAGOGASTRODUODENOSCOPY (EGD) WITH PROPOFOL;  Surgeon: Laurence Spates, MD;  Location: [place];  Service: Endoscopy;  Laterality: N/A;   ESOPHAGOGASTRODUODENOSCOPY (EGD) WITH PROPOFOL N/A 03/03/2017   Procedure: ESOPHAGOGASTRODUODENOSCOPY (EGD) WITH PROPOFOL;  Surgeon: Laurence Spates, MD;  Location: [place];  Service: Endoscopy;  Laterality: N/A;   ESOPHAGOGASTRODUODENOSCOPY (EGD) WITH PROPOFOL N/A 09/04/2017   Procedure: ESOPHAGOGASTRODUODENOSCOPY (EGD) WITH PROPOFOL;  Surgeon: [name], MD;  Location: [place];  Service: Gastroenterology;  Laterality: N/A;   ESOPHAGOGASTRODUODENOSCOPY (EGD) WITH PROPOFOL N/A 11/27/2017   Procedure: ESOPHAGOGASTRODUODENOSCOPY (EGD);  Surgeon: [name], MD;  Location: [place];  Service: Gastroenterology;  Laterality: N/A;   ESOPHAGOGASTRODUODENOSCOPY (EGD) WITH PROPOFOL N/A 08/16/2019   Procedure: ESOPHAGOGASTRODUODENOSCOPY (EGD) WITH PROPOFOL;  Surgeon: [name], MD;  [facility];  Service: Gastroenterology;  Laterality: N/A;   ESOPHAGOGASTRODUODENOSCOPY (EGD) WITH PROPOFOL N/A 08/21/2020   Procedure: ESOPHAGOGASTRODUODENOSCOPY (EGD) WITH PROPOFOL;  Surgeon: [name], MD;  [facility];  Service: Gastroenterology;  Laterality: N/A;   ESOPHAGOGASTRODUODENOSCOPY (EGD) WITH PROPOFOL N/A 08/24/2021   Procedure: ESOPHAGOGASTRODUODENOSCOPY (EGD) WITH PROPOFOL;  Surgeon: [name], MD;  [facility];  Service: Gastroenterology;  Laterality: N/A;   GASTRIC VARICES BANDING N/A 08/24/2021   Procedure: GASTRIC VARICES BANDING;  Surgeon: [name], MD;  [facility];  Service: Gastroenterology;  Laterality: N/A;   GASTRIC VARICES BANDING N/A 08/13/2022   Procedure: GASTRIC VARICES BANDING;  Surgeon: [name], MD;  [facility];  Service:  Gastroenterology;  Laterality: N/A;   HEMOSTASIS CLIP PLACEMENT  08/21/2020   Procedure: HEMOSTASIS CLIP PLACEMENT;  Surgeon: [name], MD;  [facility];  Service: Gastroenterology;;   INTERCOSTAL NERVE BLOCK Left 07/01/2022   Procedure: INTERCOSTAL NERVE BLOCK;  Surgeon: [name], MD;  Location: [place];  Service: Thoracic;  Laterality: Left;   IR GENERIC HISTORICAL  05/15/2016   IR RADIOLOGIST EVAL & MGMT 05/15/2016 Corrie Mckusick, DO [facility] INTER[facility] RAD   IR GENERIC HISTORICAL  01/14/2017   IR US GUIDE VASC ACCESS RIGHT 01/14/2017 Greggory Keen, MD [facility]   IR GENERIC HISTORICAL  01/14/2017   IR VENOGRAM HEPATIC W HEMODYNAMIC EVALUATION 01/14/2017 Greggory Keen, MD [facility]   IR RADIOLOGIST EVAL & MGMT  06/12/2017   IR RADIOLOGIST EVAL & MGMT  08/11/2018   IR RADIOLOGIST EVAL & MGMT  08/25/2019   IR RADIOLOGIST EVAL & MGMT  08/22/2020   IR RADIOLOGIST EVAL & MGMT  03/11/2022   MASTECTOMY Right 06/02/12   right breast with lymph node removal   NODE DISSECTION Left 07/01/2022   Procedure: NODE DISSECTION;  Surgeon: [name], MD;  Location: [facility] OR;  Service: Thoracic;  Laterality: Left;   PORT-A-CATH REMOVAL     PORTACATH PLACEMENT  2011   left side   RADIOLOGY WITH ANESTHESIA N/A 04/13/2016   Procedure: RADIOLOGY WITH ANESTHESIA;  Surgeon: Corrie Mckusick, DO;  Location: [place];  Service: Anesthesiology;  Laterality: N/A;   VIDEO BRONCHOSCOPY WITH ENDOBRONCHIAL NAVIGATION N/A 07/01/2022   Procedure: VIDEO BRONCHOSCOPY WITH ENDOBRONCHIAL NAVIGATION;  Surgeon: [name], MD;  [facility];  Service: Thoracic;  Laterality: N/A;   XI ROBOTIC ASSISTED THORACOSCOPY- SEGMENTECTOMY Left 07/01/2022   Procedure: XI ROBOTIC ASSISTED THORACOSCOPY-LEFT LOWER LOBE BASILAR SEGMENTECTOMY;  Surgeon: [name], MD;  [facility];  Service: Thoracic;  Laterality: Left;    FAMILY HISTORY Family History  Problem Relation Age of Onset   COPD Mother    AAA  (abdominal aortic aneurysm) Mother    COPD Father    Cancer Neg Hx   The patient's father died at the age of 66 from heart disease. Patient's mother died at the age of 36 status post CABG. The patient had no brothers, 2 sisters. There is no history of breast or ovarian cancer in the family.   GYNECOLOGIC HISTORY: Updated May 2018 Menarche age 33, she is GX P0. She does not recall when she went through menopause. She never took hormone replacement.   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 Husband Merry Proud is retired from [employer]. They have no children and no pets. They attend a local [place].    ADVANCED DIRECTIVES: Not in place   HEALTH MAINTENANCE: Social History   Tobacco Use   Smoking status: Never   Smokeless tobacco: Never  Vaping Use   Vaping Use: Never used  Substance Use Topics   Alcohol use: No   Drug use: No     Colonoscopy: Never  PAP: Remote  Bone density:  09/27/2013, osteoporosis with a T score of -2.8  Lipid panel: Not on file    No Active Allergies   Current Outpatient Medications  Medication Sig Dispense Refill   denosumab (PROLIA) 60 MG/ML SOLN injection Inject 60 mg into the skin every 6 (six) months. Administer in upper arm, thigh, or abdomen     ELIQUIS 5 MG TABS tablet TAKE 1 TABLET BY MOUTH TWICE A DAY 60 tablet 2   folic acid (FOLVITE) 1 MG tablet Take 1 mg by mouth daily.     Milk Thistle 1000 MG CAPS Take 1,000 mg by mouth daily.     palbociclib (IBRANCE) 75 MG tablet Take 1 tablet (75 mg total) by mouth daily. Take for 21 days on, 7 days off, repeat every 28 days. 21 tablet 3   pantoprazole (PROTONIX) 40 MG tablet Take 1 tablet (40 mg total) by mouth daily. 60 tablet 0   propranolol (INDERAL) 20 MG tablet Take 1 tablet (20 mg total) by mouth 2 (two) times daily. 60 tablet 0   venlafaxine XR (EFFEXOR-XR) 150 MG 24 hr capsule Take  1 capsule (150 mg total) by mouth daily with breakfast. 90 capsule 1   No current facility-administered medications for this visit.   Facility-Administered Medications Ordered in Other Visits  Medication Dose Route Frequency Provider Last Rate Last Admin   denosumab (PROLIA) injection 60 mg  60 mg Subcutaneous Once Magrinat, Virgie Dad, MD        OBJECTIVE: white woman who appears younger than stated age  32:   01/27/23 1332  BP: 129/77  Pulse: 100  Resp: 16  Temp: 99 F (37.2 C)  TempSrc: Temporal  SpO2: 99%  Weight: 137 lb 11.2 oz (62.5 kg)  Height: '5\' 4"'$  (1.626 m)   Physical Exam Constitutional:      Appearance: Normal appearance.  Cardiovascular:     Rate and Rhythm: Normal rate and regular rhythm.     Pulses: Normal pulses.     Heart sounds: Normal heart sounds.  Pulmonary:     Effort: Pulmonary effort is normal.     Breath sounds: Normal breath sounds.  Musculoskeletal:        General: No swelling or tenderness. Normal range of motion.     Cervical back: Normal range of motion. No rigidity.  Lymphadenopathy:     Cervical: No cervical adenopathy.  Skin:    General: Skin is warm and dry.  Neurological:     General: No focal deficit present.     Mental Status: She is alert.    LAB RESULTS:  CMP     Component Value Date/Time   NA 139 09/23/2022 1327   NA 142 11/17/2017 1355   K 4.0 09/23/2022 1327   K 4.4 11/17/2017 1355   CL 110 09/23/2022 1327   CL 109 (H) 04/20/2013 0859   CO2 22 09/23/2022 1327   CO2 22 11/17/2017 1355   GLUCOSE 94 09/23/2022 1327   GLUCOSE 86 11/17/2017 1355   GLUCOSE 91 04/20/2013 0859   BUN 18 09/23/2022 1327   BUN 12.5 11/17/2017 1355   CREATININE 1.07 (H) 09/23/2022 1327   CREATININE 1.1 11/17/2017 1355   CALCIUM 8.4 (L) 09/23/2022 1327   CALCIUM 9.2 11/17/2017 1355   PROT 6.7 09/23/2022 1327   PROT 7.0 11/17/2017 1355   ALBUMIN 4.0 09/23/2022 1327   ALBUMIN 3.8 11/17/2017 1355   AST 14 (L) 09/23/2022 1327   AST 23  11/17/2017 1355   ALT 11 09/23/2022 1327  ALT 22 11/17/2017 1355   ALKPHOS 54 09/23/2022 1327   ALKPHOS 55 11/17/2017 1355   BILITOT 0.7 09/23/2022 1327   BILITOT 0.49 11/17/2017 1355   GFRNONAA 57 (L) 09/23/2022 1327   GFRNONAA 53 (L) 05/09/2016 1104   GFRAA 53 (L) 06/12/2020 1146   GFRAA 55 (L) 12/13/2019 1200   GFRAA 61 05/09/2016 1104    No results found for: "TOTALPROTELP", "ALBUMINELP", "A1GS", "A2GS", "BETS", "BETA2SER", "GAMS", "MSPIKE", "SPEI"  Lab Results  Component Value Date   WBC 3.3 (L) 01/27/2023   NEUTROABS 2.4 01/27/2023   HGB 11.3 (L) 01/27/2023   HCT 33.2 (L) 01/27/2023   MCV 97.1 01/27/2023   PLT 111 (L) 01/27/2023    Lab Results  Component Value Date   LABCA2 18 08/21/2012    No components found for: "RCVELF810"  No results for input(s): "INR" in the last 168 hours.  Lab Results  Component Value Date   LABCA2 18 08/21/2012    No results found for: "FBP102"  No results found for: "CAN125"  No results found for: "HEN277"  Lab Results  Component Value Date   CA2729 26.9 12/13/2019    No components found for: "HGQUANT"  No results found for: "CEA1", "CEA" / No results found for: "CEA1", "CEA"   No results found for: "AFPTUMOR"  No results found for: "CHROMOGRNA"  No results found for: "KPAFRELGTCHN", "LAMBDASER", "KAPLAMBRATIO" (kappa/lambda light chains)  No results found for: "HGBA", "HGBA2QUANT", "HGBFQUANT", "HGBSQUAN" (Hemoglobinopathy evaluation)   Lab Results  Component Value Date   LDH 195 08/21/2012    Lab Results  Component Value Date   IRON 27 (L) 04/11/2016   TIBC 340 04/11/2016   IRONPCTSAT 8 (L) 04/11/2016   (Iron and TIBC)  Lab Results  Component Value Date   FERRITIN 18 04/11/2016    Urinalysis    Component Value Date/Time   COLORURINE YELLOW 06/28/2022 1122   APPEARANCEUR CLEAR 06/28/2022 1122   LABSPEC 1.015 06/28/2022 1122   LABSPEC 1.010 04/29/2016 1118   PHURINE 6.0 06/28/2022 1122    GLUCOSEU NEGATIVE 06/28/2022 1122   GLUCOSEU Negative 04/29/2016 1118   HGBUR NEGATIVE 06/28/2022 1122   BILIRUBINUR NEGATIVE 06/28/2022 1122   BILIRUBINUR Negative 04/29/2016 1118   KETONESUR NEGATIVE 06/28/2022 1122   PROTEINUR NEGATIVE 06/28/2022 1122   UROBILINOGEN 0.2 04/29/2016 1118   NITRITE NEGATIVE 06/28/2022 1122   LEUKOCYTESUR NEGATIVE 06/28/2022 1122   LEUKOCYTESUR Trace 04/29/2016 1118    STUDIES: No results found.  COMPARISON: Abdominal ultrasound November 05, 2021   TECHNIQUE:  Real time limited abdomen ultrasound was performed.   IMAGE QUALITY: Satisfactory, of diagnostic value.   FINDINGS:   Limited sonographic views of the abdomen demonstrates mild abdominal ascites. There is no large pocket to safely drain.    IMPRESSION:  IMPRESSION:  1. Mild abdominal ascites.  2. No large enough pocket to safely drain.    ASSESSMENT: 67 y.o. [place] woman presenting June 2011 with stage IV breast cancer involving the right breast upper outer quadrant, right axilla, mediastinal lymph nodes, and both lungs, but not the brain, liver, or bones  (1) positive right breast and right axillary lymph node biopsies 07/17/2010 of a clinical T4 N2 M1 invasive ductal carcinoma, grade 1, estrogen receptor 100% positive, progesterone receptor 6% positive, with an MIB-1 of 12%, and no HER-2 amplification.  (2) participated in Phase II tessetaxel study, receiving 2 cycles complicated by thrombocytopenia, anemia requiring transfusion, transaminase elevation, and afebrile neutropenia. Off-study as of September 2011. Chest CT scan  September 2011 did show evidence of response.  (3) on letrozole as of October 2011, with continuing response and good tolerance   (4) 06/02/2012 underwent right mastectomy and axillary lymph node sampling (3 lymph nodes removed, all with viable cancer as well as evidence of treatment effect) for a ypT2 ypN1-2 invasive ductal carcinoma, grade 1, 98% estrogen  receptor positive, progesterone receptor negative, with no HER-2 amplification  (5) left jugular vein DVT documented 06/16/2012; left sided Port-A-Cath removed mid July 2015; Coumadin stopped 09/07/2014.  (a) Left brachiocephalic v. slightly narrowed, with some Left lower neck collateralization noted on chest CT December 2015  (6) osteoporosis, was on alendronate, switched to denosumab/Prolia 11/07/2016  (a) bone density in 10/2018 shows osteopenia T score of -2.2  (7) portal hypertension, with splenomegaly, gastric varices, and intestinal edema  (a)  mild persistent leukopenia and thrombocytopenia likely due to splenomegaly  (b) iron deficiency secondary to above, status post Feraheme x2 December 2018  (8) restaging studies:  (a) chest CT 12/26/2017 shows a 4.5 mm left lower lobe pulmonary nodule which is unchanged.  There was no other evidence of active disease  (b) restaging chest CT in 05/2019 shows no change in bilateral pulmonary nodules, prominent mediastinal lymph nodes are stable  (9) CT scan of the abdomen 11/03/2021 shows thrombus in the portal, splenic and superior mesenteric vein  (A) Heparin started 11/03/2021  10. PET scan 3/32/9518, low metabolic activity of right lower pulmonary nodule. No change in size of CT 02/27/2022, Carcinoma with mucinous features consistent with metastatic breast carcinoma, negative margins, no LN involvement.  Prognostics showed ER +100% strong staining, PR +20% staining, Ki-67 of 10% and HER2 2+ by IHC and negative by FISH  PLAN:  Given metastatic breast cancer progression on letrozole,she is now on Faslodex with Ibrance.   She is currently on this combination and is tolerating it very well.   She has baseline thrombocytopenia due to splenomegaly, this has gotten worse, hence we changed the Ibrance to 75 mg po daily She also continues on Prolia every 6 months for osteoporosis, last bone density in March 2023 with a T score of -2.4 improved. Next due  in February. No new dental concerns reported. No concerns on ROS or PE today She will continue current combination, proceed with faslodex today Repeat imaging in March 2024, ordered today. CBC today reviewed, stable, CMP pending.   Total time spent: 30 min  *Total Encounter Time as defined by the Centers for Medicare and Medicaid Services includes, in addition to the face-to-face time of a patient visit (documented in the note above) non-face-to-face time: obtaining and reviewing outside history, ordering and reviewing medications, tests or procedures, care coordination (communications with other health care professionals or caregivers) and documentation in the medical record.

## 2023-02-14 ENCOUNTER — Ambulatory Visit: Payer: Medicare Other | Admitting: Hematology and Oncology

## 2023-02-24 ENCOUNTER — Inpatient Hospital Stay: Payer: Medicare Other

## 2023-02-24 ENCOUNTER — Inpatient Hospital Stay: Payer: Medicare Other | Attending: Hematology and Oncology

## 2023-02-24 ENCOUNTER — Other Ambulatory Visit: Payer: Self-pay | Admitting: Interventional Radiology

## 2023-02-24 VITALS — BP 141/79 | HR 94 | Temp 98.1°F | Resp 18

## 2023-02-24 DIAGNOSIS — Z79818 Long term (current) use of other agents affecting estrogen receptors and estrogen levels: Secondary | ICD-10-CM | POA: Insufficient documentation

## 2023-02-24 DIAGNOSIS — C50411 Malignant neoplasm of upper-outer quadrant of right female breast: Secondary | ICD-10-CM | POA: Insufficient documentation

## 2023-02-24 DIAGNOSIS — M810 Age-related osteoporosis without current pathological fracture: Secondary | ICD-10-CM

## 2023-02-24 DIAGNOSIS — C7801 Secondary malignant neoplasm of right lung: Secondary | ICD-10-CM | POA: Insufficient documentation

## 2023-02-24 DIAGNOSIS — C50911 Malignant neoplasm of unspecified site of right female breast: Secondary | ICD-10-CM

## 2023-02-24 DIAGNOSIS — C7802 Secondary malignant neoplasm of left lung: Secondary | ICD-10-CM | POA: Diagnosis not present

## 2023-02-24 DIAGNOSIS — Z170 Estrogen receptor positive status [ER+]: Secondary | ICD-10-CM | POA: Insufficient documentation

## 2023-02-24 DIAGNOSIS — Z5111 Encounter for antineoplastic chemotherapy: Secondary | ICD-10-CM | POA: Insufficient documentation

## 2023-02-24 DIAGNOSIS — C778 Secondary and unspecified malignant neoplasm of lymph nodes of multiple regions: Secondary | ICD-10-CM | POA: Insufficient documentation

## 2023-02-24 DIAGNOSIS — I864 Gastric varices: Secondary | ICD-10-CM

## 2023-02-24 LAB — COMPREHENSIVE METABOLIC PANEL
ALT: 11 U/L (ref 0–44)
AST: 16 U/L (ref 15–41)
Albumin: 4 g/dL (ref 3.5–5.0)
Alkaline Phosphatase: 67 U/L (ref 38–126)
Anion gap: 9 (ref 5–15)
BUN: 17 mg/dL (ref 8–23)
CO2: 23 mmol/L (ref 22–32)
Calcium: 8.5 mg/dL — ABNORMAL LOW (ref 8.9–10.3)
Chloride: 107 mmol/L (ref 98–111)
Creatinine, Ser: 1.1 mg/dL — ABNORMAL HIGH (ref 0.44–1.00)
GFR, Estimated: 55 mL/min — ABNORMAL LOW (ref >60)
Glucose, Bld: 83 mg/dL (ref 70–99)
Potassium: 4.7 mmol/L (ref 3.5–5.1)
Sodium: 139 mmol/L (ref 135–145)
Total Bilirubin: 0.7 mg/dL (ref 0.3–1.2)
Total Protein: 6.8 g/dL (ref 6.5–8.1)

## 2023-02-24 LAB — CBC WITH DIFFERENTIAL/PLATELET
Abs Immature Granulocytes: 0.01 10*3/uL (ref 0.00–0.07)
Basophils Absolute: 0 10*3/uL (ref 0.0–0.1)
Basophils Relative: 0 %
Eosinophils Absolute: 0 10*3/uL (ref 0.0–0.5)
Eosinophils Relative: 2 %
HCT: 32.5 % — ABNORMAL LOW (ref 36.0–46.0)
Hemoglobin: 10.8 g/dL — ABNORMAL LOW (ref 12.0–15.0)
Immature Granulocytes: 0 %
Lymphocytes Relative: 16 %
Lymphs Abs: 0.4 10*3/uL — ABNORMAL LOW (ref 0.7–4.0)
MCH: 32.6 pg (ref 26.0–34.0)
MCHC: 33.2 g/dL (ref 30.0–36.0)
MCV: 98.2 fL (ref 80.0–100.0)
Monocytes Absolute: 0.2 10*3/uL (ref 0.1–1.0)
Monocytes Relative: 7 %
Neutro Abs: 1.7 10*3/uL (ref 1.7–7.7)
Neutrophils Relative %: 75 %
Platelets: 86 10*3/uL — ABNORMAL LOW (ref 150–400)
RBC: 3.31 MIL/uL — ABNORMAL LOW (ref 3.87–5.11)
RDW: 17.4 % — ABNORMAL HIGH (ref 11.5–15.5)
WBC: 2.3 10*3/uL — ABNORMAL LOW (ref 4.0–10.5)
nRBC: 0 % (ref 0.0–0.2)

## 2023-02-24 MED ORDER — FULVESTRANT 250 MG/5ML IM SOSY
500.0000 mg | PREFILLED_SYRINGE | Freq: Once | INTRAMUSCULAR | Status: AC
  Administered 2023-02-24: 500 mg via INTRAMUSCULAR
  Filled 2023-02-24: qty 10

## 2023-03-13 ENCOUNTER — Encounter (HOSPITAL_COMMUNITY)
Admission: RE | Admit: 2023-03-13 | Discharge: 2023-03-13 | Disposition: A | Discharge status: Home / Self Care | Payer: Medicare Other | Source: Ambulatory Visit | Attending: Hematology and Oncology | Admitting: Hematology and Oncology

## 2023-03-13 DIAGNOSIS — C50411 Malignant neoplasm of upper-outer quadrant of right female breast: Secondary | ICD-10-CM | POA: Diagnosis present

## 2023-03-13 DIAGNOSIS — Z170 Estrogen receptor positive status [ER+]: Secondary | ICD-10-CM | POA: Diagnosis present

## 2023-03-13 LAB — GLUCOSE, CAPILLARY: Glucose-Capillary: 104 mg/dL — ABNORMAL HIGH (ref 70–99)

## 2023-03-13 MED ORDER — FLUDEOXYGLUCOSE F - 18 (FDG) INJECTION
6.6000 | Freq: Once | INTRAVENOUS | Status: AC | PRN
  Administered 2023-03-13: 6.6 via INTRAVENOUS

## 2023-03-24 ENCOUNTER — Inpatient Hospital Stay: Payer: Medicare Other

## 2023-03-24 ENCOUNTER — Encounter: Payer: Self-pay | Admitting: Hematology and Oncology

## 2023-03-24 ENCOUNTER — Inpatient Hospital Stay: Payer: Medicare Other | Attending: Hematology and Oncology | Admitting: Hematology and Oncology

## 2023-03-24 ENCOUNTER — Other Ambulatory Visit: Payer: Self-pay

## 2023-03-24 VITALS — BP 123/72 | HR 94 | Temp 97.8°F | Resp 16 | Ht 64.0 in | Wt 137.7 lb

## 2023-03-24 DIAGNOSIS — M810 Age-related osteoporosis without current pathological fracture: Secondary | ICD-10-CM | POA: Diagnosis not present

## 2023-03-24 DIAGNOSIS — Z5111 Encounter for antineoplastic chemotherapy: Secondary | ICD-10-CM | POA: Insufficient documentation

## 2023-03-24 DIAGNOSIS — C7801 Secondary malignant neoplasm of right lung: Secondary | ICD-10-CM | POA: Diagnosis not present

## 2023-03-24 DIAGNOSIS — C50911 Malignant neoplasm of unspecified site of right female breast: Secondary | ICD-10-CM

## 2023-03-24 DIAGNOSIS — Z170 Estrogen receptor positive status [ER+]: Secondary | ICD-10-CM | POA: Diagnosis not present

## 2023-03-24 DIAGNOSIS — C7802 Secondary malignant neoplasm of left lung: Secondary | ICD-10-CM | POA: Diagnosis not present

## 2023-03-24 DIAGNOSIS — C50411 Malignant neoplasm of upper-outer quadrant of right female breast: Secondary | ICD-10-CM | POA: Insufficient documentation

## 2023-03-24 DIAGNOSIS — C778 Secondary and unspecified malignant neoplasm of lymph nodes of multiple regions: Secondary | ICD-10-CM | POA: Insufficient documentation

## 2023-03-24 LAB — COMPREHENSIVE METABOLIC PANEL
ALT: 14 U/L (ref 0–44)
AST: 18 U/L (ref 15–41)
Albumin: 3.9 g/dL (ref 3.5–5.0)
Alkaline Phosphatase: 62 U/L (ref 38–126)
Anion gap: 7 (ref 5–15)
BUN: 17 mg/dL (ref 8–23)
CO2: 23 mmol/L (ref 22–32)
Calcium: 8.5 mg/dL — ABNORMAL LOW (ref 8.9–10.3)
Chloride: 111 mmol/L (ref 98–111)
Creatinine, Ser: 1.14 mg/dL — ABNORMAL HIGH (ref 0.44–1.00)
GFR, Estimated: 52 mL/min — ABNORMAL LOW (ref >60)
Glucose, Bld: 94 mg/dL (ref 70–99)
Potassium: 4 mmol/L (ref 3.5–5.1)
Sodium: 141 mmol/L (ref 135–145)
Total Bilirubin: 0.7 mg/dL (ref 0.3–1.2)
Total Protein: 6.4 g/dL — ABNORMAL LOW (ref 6.5–8.1)

## 2023-03-24 LAB — CBC WITH DIFFERENTIAL/PLATELET
Abs Immature Granulocytes: 0 10*3/uL (ref 0.00–0.07)
Basophils Absolute: 0 10*3/uL (ref 0.0–0.1)
Basophils Relative: 1 %
Eosinophils Absolute: 0.1 10*3/uL (ref 0.0–0.5)
Eosinophils Relative: 3 %
HCT: 29.3 % — ABNORMAL LOW (ref 36.0–46.0)
Hemoglobin: 9.8 g/dL — ABNORMAL LOW (ref 12.0–15.0)
Immature Granulocytes: 0 %
Lymphocytes Relative: 16 %
Lymphs Abs: 0.4 10*3/uL — ABNORMAL LOW (ref 0.7–4.0)
MCH: 33 pg (ref 26.0–34.0)
MCHC: 33.4 g/dL (ref 30.0–36.0)
MCV: 98.7 fL (ref 80.0–100.0)
Monocytes Absolute: 0.3 10*3/uL (ref 0.1–1.0)
Monocytes Relative: 11 %
Neutro Abs: 1.7 10*3/uL (ref 1.7–7.7)
Neutrophils Relative %: 69 %
Platelets: 91 10*3/uL — ABNORMAL LOW (ref 150–400)
RBC: 2.97 MIL/uL — ABNORMAL LOW (ref 3.87–5.11)
RDW: 17.1 % — ABNORMAL HIGH (ref 11.5–15.5)
WBC: 2.4 10*3/uL — ABNORMAL LOW (ref 4.0–10.5)
nRBC: 0 % (ref 0.0–0.2)

## 2023-03-24 MED ORDER — DENOSUMAB 60 MG/ML ~~LOC~~ SOSY: 60.0000 mg | PREFILLED_SYRINGE | Freq: Once | SUBCUTANEOUS | 0 refills | Status: AC

## 2023-03-24 MED ORDER — DENOSUMAB 60 MG/ML ~~LOC~~ SOSY
60.0000 mg | PREFILLED_SYRINGE | Freq: Once | SUBCUTANEOUS | Status: AC
  Administered 2023-03-24: 60 mg via SUBCUTANEOUS
  Filled 2023-03-24: qty 1

## 2023-03-24 MED ORDER — FULVESTRANT 250 MG/5ML IM SOSY
500.0000 mg | PREFILLED_SYRINGE | Freq: Once | INTRAMUSCULAR | Status: AC
  Administered 2023-03-24: 500 mg via INTRAMUSCULAR
  Filled 2023-03-24: qty 10

## 2023-03-24 NOTE — Progress Notes (Signed)
Nicole [name]  Telephone:([phone]) [phone] Fax:(336) [phone]    ID: MAGDA BOLENBAUGH   DOB: 1956/05/14  MR#: [id number]  [id number]  Patient Care Team: Benay Pike, MD as PCP - General (Hematology and Oncology) Roosevelt Locks, CRNP as Nurse Practitioner (Nurse Practitioner) Rolm Bookbinder, MD as Consulting Physician (General Surgery) Laurence Spates, MD (Inactive) as Consulting Physician (Gastroenterology) [name], MD as Consulting Physician (Gastroenterology)  CHIEF COMPLAINT:  Metastatic Breast Cancer (s/p right mastectomy)  CURRENT TREATMENT: [facility] and Faslodex   INTERVAL HISTORY:  Nicole [name] returns today for follow-up of her estrogen receptor positive stage IV breast cancer.   She is doing well today. She is compliant with all her medications. She tells me that she is lazy and does not do much otherwise no health issues.  No change in energy levels. She otherwise denies any new bone pains. No change in breathing No new bowel issues. No new dental concerns.  Rest of the pertinent 10 point ROS reviewed and negative  COVID 19 VACCINATION STATUS: Status post [place] x2+ booster August 2021   BREAST CANCER HISTORY: From Dr. Collier Salina Rubin's 07/25/2010 note:  "This woman has not had any significant medical intervention for some time.  Her last mammogram was about seven years ago.  She noted a right breast mass about a year ago and did not seek immediate medical attention for this.  She ultimately, after some time, admitted this to her husband and self-referred herself for intervention.  She had a mammogram on 07/17/2010 with an ultrasound of the right breast.  This showed a lobulated mass upper right outer quadrant measuring at least 6.3 x 7.3 cm and large right axillary lymph nodes were also noted.  There was skin thickening overlying the mass.  On physical exam, this mass was about an 8 cm with skin dimpling noted. Discolored area in the skin over the mass, fullness  in the axilla.  Biopsy was recommended, which took place on 07/17/2010.  Pathology showed invasive ductal cancer involving both lymph node and breast.  The HER-2 was not amplified.  ER and PR both positive at 100% and 6% respectively.  Proliferative index was 12% involving both the lymph node and breast mass.  An MRI scan of both breasts was performed on 07/22/2010, essentially which showed a large heterogeneously enhancing mass of the right breast with washout kinetics measuring 6.4 x 4.4 x 5.0 cm.  Numerous satellite nodules were seen throughout the dominant mass.  There were several suspicious nodules in the upper central and upper inner quadrant of the right breast.  There were two enhancing subcentimeter nodules seen in the right pectoralis muscle.  No obvious chest wall nodules were seen; however, there was seen some metastatic adenopathy in the mediastinum as well as hila with a 2.4 x 2.0 cm mediastinal lymph node as well as 1.2 x 3.0 cm subcarinal lymph node.  There was a right hilar and probable left hilar adenopathy.  Bilateral pulmonary nodules were seen and a right T2 lesion was seen anterior right liver as well."  Her subsequent history is as detailed below.   PAST MEDICAL HISTORY: Past Medical History:  Diagnosis Date   Anemia    low iron   Arthritis    Blood transfusion 2012   Breast CA ([place])    breast ca dx 06/2010, stage 4, right , Lung Metasis.Surgery, Chemo, Radiation   Breast cancer ([place])    Clot    jugular  ?2015   Depression  Esophageal varices ([facility])    Headache    Mirgraine- rare since menopause   Heart murmur    mild, no cardiologist, from birth   Hematemesis 08/2017   History of kidney stones    no pain- shows in CT- small 1 mm bilaterally- kidney   Hypertension    IBD (inflammatory bowel disease)    resolved   Idiopathic cirrhosis ([place])    pt denies   Peripheral vascular disease ([place]) few yrs ago   clot in right jugular   Portal hypertension ([place])     Radiation 07/21/12-09/07/12   5940 cGy   Restless legs     PAST SURGICAL HISTORY: Past Surgical History:  Procedure Laterality Date   BIOPSY  08/21/2020   Procedure: BIOPSY;  Surgeon: [name], MD;  Location: [facility];  Service: Gastroenterology;;   COLONOSCOPY  2016   DILATION AND CURETTAGE OF UTERUS  2004?   ESOPHAGEAL BANDING  09/04/2017   Procedure: ESOPHAGEAL BANDING;  Surgeon: [name], MD;  Location: [facility] ENDOSCOPY;  Service: Gastroenterology;;   ESOPHAGEAL BANDING  11/27/2017   Procedure: ESOPHAGEAL BANDING;  Surgeon: [name], MD;  Location: [place];  Service: Gastroenterology;;   ESOPHAGEAL BANDING N/A 04/28/2018   Procedure: ESOPHAGEAL BANDING;  Surgeon: [name], MD;  [facility];  Service: Gastroenterology;  Laterality: N/A;   ESOPHAGEAL BANDING  11/17/2018   Procedure: ESOPHAGEAL BANDING;  Surgeon: [name], MD;  Location: [facility];  Service: Gastroenterology;;   ESOPHAGEAL BANDING  08/16/2019   Procedure: ESOPHAGEAL BANDING;  Surgeon: [name], MD;  Location: [facility];  Service: Gastroenterology;;   ESOPHAGEAL BANDING N/A 08/21/2020   Procedure: ESOPHAGEAL BANDING;  Surgeon: [name], MD;  [facility];  Service: Gastroenterology;  Laterality: N/A;   ESOPHAGOGASTRODUODENOSCOPY N/A 04/28/2018   Procedure: ESOPHAGOGASTRODUODENOSCOPY (EGD);  Surgeon: [name], MD;  Location: [facility];  Service: Gastroenterology;  Laterality: N/A;   ESOPHAGOGASTRODUODENOSCOPY N/A 11/17/2018   Procedure: ESOPHAGOGASTRODUODENOSCOPY (EGD);  Surgeon: [name], MD;  Location: [facility];  Service: Gastroenterology;  Laterality: N/A;   ESOPHAGOGASTRODUODENOSCOPY N/A 08/13/2022   Procedure: ESOPHAGOGASTRODUODENOSCOPY (EGD);  Surgeon: [name], MD;  Location: [facility];  Service: Gastroenterology;  Laterality: N/A;   ESOPHAGOGASTRODUODENOSCOPY (EGD) WITH PROPOFOL N/A 04/11/2016   Procedure: ESOPHAGOGASTRODUODENOSCOPY (EGD) WITH PROPOFOL;   Surgeon: Clarene Essex, MD;  [facility];  Service: Endoscopy;  Laterality: N/A;   ESOPHAGOGASTRODUODENOSCOPY (EGD) WITH PROPOFOL N/A 07/08/2016   Procedure: ESOPHAGOGASTRODUODENOSCOPY (EGD) WITH PROPOFOL;  Surgeon: Laurence Spates, MD;  Location: [place];  Service: Endoscopy;  Laterality: N/A;   ESOPHAGOGASTRODUODENOSCOPY (EGD) WITH PROPOFOL N/A 03/03/2017   Procedure: ESOPHAGOGASTRODUODENOSCOPY (EGD) WITH PROPOFOL;  Surgeon: Laurence Spates, MD;  Location: [place];  Service: Endoscopy;  Laterality: N/A;   ESOPHAGOGASTRODUODENOSCOPY (EGD) WITH PROPOFOL N/A 09/04/2017   Procedure: ESOPHAGOGASTRODUODENOSCOPY (EGD) WITH PROPOFOL;  Surgeon: [name], MD;  Location: [place];  Service: Gastroenterology;  Laterality: N/A;   ESOPHAGOGASTRODUODENOSCOPY (EGD) WITH PROPOFOL N/A 11/27/2017   Procedure: ESOPHAGOGASTRODUODENOSCOPY (EGD);  Surgeon: [name], MD;  Location: [place];  Service: Gastroenterology;  Laterality: N/A;   ESOPHAGOGASTRODUODENOSCOPY (EGD) WITH PROPOFOL N/A 08/16/2019   Procedure: ESOPHAGOGASTRODUODENOSCOPY (EGD) WITH PROPOFOL;  Surgeon: [name], MD;  [facility];  Service: Gastroenterology;  Laterality: N/A;   ESOPHAGOGASTRODUODENOSCOPY (EGD) WITH PROPOFOL N/A 08/21/2020   Procedure: ESOPHAGOGASTRODUODENOSCOPY (EGD) WITH PROPOFOL;  Surgeon: [name], MD;  [facility];  Service: Gastroenterology;  Laterality: N/A;   ESOPHAGOGASTRODUODENOSCOPY (EGD) WITH PROPOFOL N/A 08/24/2021   Procedure: ESOPHAGOGASTRODUODENOSCOPY (EGD) WITH PROPOFOL;  Surgeon: [name], MD;  Location: [facility];  Service: Gastroenterology;  Laterality: N/A;   GASTRIC VARICES BANDING N/A 08/24/2021   Procedure: GASTRIC VARICES BANDING;  Surgeon: [name], MD;  [facility];  Service: Gastroenterology;  Laterality: N/A;   GASTRIC VARICES BANDING N/A 08/13/2022   Procedure: GASTRIC VARICES BANDING;  Surgeon: [name], MD;  [facility];   Service: Gastroenterology;  Laterality: N/A;   HEMOSTASIS CLIP PLACEMENT  08/21/2020   Procedure: HEMOSTASIS CLIP PLACEMENT;  Surgeon: [name], MD;  [facility];  Service: Gastroenterology;;   INTERCOSTAL NERVE BLOCK Left 07/01/2022   Procedure: INTERCOSTAL NERVE BLOCK;  Surgeon: [name], MD;  Location: [place];  Service: Thoracic;  Laterality: Left;   IR GENERIC HISTORICAL  05/15/2016   IR RADIOLOGIST EVAL & MGMT 05/15/2016 Corrie Mckusick, DO [facility] INTERV RAD   IR GENERIC HISTORICAL  01/14/2017   IR US GUIDE VASC ACCESS RIGHT 01/14/2017 Greggory Keen, MD [facility]   IR GENERIC HISTORICAL  01/14/2017   IR VENOGRAM HEPATIC W HEMODYNAMIC EVALUATION 01/14/2017 Greggory Keen, MD [facility]   IR RADIOLOGIST EVAL & MGMT  06/12/2017   IR RADIOLOGIST EVAL & MGMT  08/11/2018   IR RADIOLOGIST EVAL & MGMT  08/25/2019   IR RADIOLOGIST EVAL & MGMT  08/22/2020   IR RADIOLOGIST EVAL & MGMT  03/11/2022   MASTECTOMY Right 06/02/12   right breast with lymph node removal   NODE DISSECTION Left 07/01/2022   Procedure: NODE DISSECTION;  Surgeon: [name], MD;  Location: [facility] OR;  Service: Thoracic;  Laterality: Left;   PORT-A-CATH REMOVAL     PORTACATH PLACEMENT  2011   left side   RADIOLOGY WITH ANESTHESIA N/A 04/13/2016   Procedure: RADIOLOGY WITH ANESTHESIA;  Surgeon: Corrie Mckusick, DO;  Location: [place];  Service: Anesthesiology;  Laterality: N/A;   VIDEO BRONCHOSCOPY WITH ENDOBRONCHIAL NAVIGATION N/A 07/01/2022   Procedure: VIDEO BRONCHOSCOPY WITH ENDOBRONCHIAL NAVIGATION;  Surgeon: [name], MD;  [facility];  Service: Thoracic;  Laterality: N/A;   XI ROBOTIC ASSISTED THORACOSCOPY- SEGMENTECTOMY Left 07/01/2022   Procedure: XI ROBOTIC ASSISTED THORACOSCOPY-LEFT LOWER LOBE BASILAR SEGMENTECTOMY;  Surgeon: [name], MD;  [facility];  Service: Thoracic;  Laterality: Left;    FAMILY HISTORY Family History  Problem Relation Age of Onset   COPD  Mother    AAA (abdominal aortic aneurysm) Mother    COPD Father    Cancer Neg Hx   The patient's father died at the age of 62 from heart disease. Patient's mother died at the age of 53 status post CABG. The patient had no brothers, 2 sisters. There is no history of breast or ovarian cancer in the family.   GYNECOLOGIC HISTORY: Updated May 2018 Menarche age 1, she is GX P0. She does not recall when she went through menopause. She never took hormone replacement.   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 Husband Nicole [name] is retired from [employer]. They have no children and no pets. They attend a local [employer].    ADVANCED DIRECTIVES: Not in place   HEALTH MAINTENANCE: Social History   Tobacco Use   Smoking status: Never   Smokeless tobacco: Never  Vaping Use   Vaping Use: Never used  Substance Use Topics   Alcohol use: No   Drug use: No     Colonoscopy: Never  PAP: Remote  Bone density: 09/27/2013, osteoporosis with a T score of -2.8  Lipid panel: Not on file    No Active Allergies   Current Outpatient Medications  Medication Sig Dispense Refill   denosumab (PROLIA) 60 MG/ML SOLN injection Inject 60 mg into the skin every 6 (six) months. Administer in upper arm, thigh, or abdomen     ELIQUIS 5 MG TABS tablet TAKE 1 TABLET BY MOUTH TWICE A DAY 60 tablet 2   folic acid (FOLVITE) 1 MG tablet Take 1 mg by mouth daily.     Milk Thistle 1000 MG CAPS Take 1,000 mg by mouth daily.     palbociclib (IBRANCE) 75 MG tablet Take 1 tablet (75 mg total) by mouth daily. Take for 21 days on, 7 days off, repeat every 28 days. 21 tablet 3   pantoprazole (PROTONIX) 40 MG tablet Take 1 tablet (40 mg total) by mouth daily. 60 tablet 0   propranolol (INDERAL) 20 MG tablet Take 1 tablet (20 mg total) by mouth 2 (two) times daily. 60 tablet 0   venlafaxine XR (EFFEXOR-XR) 150 MG 24  hr capsule Take 1 capsule (150 mg total) by mouth daily with breakfast. 90 capsule 1   No current facility-administered medications for this visit.   Facility-Administered Medications Ordered in Other Visits  Medication Dose Route Frequency Provider Last Rate Last Admin   denosumab (PROLIA) injection 60 mg  60 mg Subcutaneous Once Magrinat, Virgie Dad, MD        OBJECTIVE: white woman who appears younger than stated age  9:   03/24/23 1321  BP: 123/72  Pulse: 94  Resp: 16  Temp: 97.8 F (36.6 C)  TempSrc: Temporal  SpO2: 99%  Weight: 137 lb 11.2 oz (62.5 kg)  Height: 5\' 4"  (1.626 m)   Physical Exam Constitutional:      Appearance: Normal appearance.  Cardiovascular:     Rate and Rhythm: Normal rate and regular rhythm.     Pulses: Normal pulses.     Heart sounds: Normal heart sounds.  Pulmonary:     Effort: Pulmonary effort is normal.     Breath sounds: Normal breath sounds.  Musculoskeletal:        General: No swelling or tenderness. Normal range of motion.     Cervical back: Normal range of motion. No rigidity.  Lymphadenopathy:     Cervical: No cervical adenopathy.  Skin:    General: Skin is warm and dry.  Neurological:     General: No focal deficit present.     Mental Status: She is alert.    LAB RESULTS:  CMP     Component Value Date/Time   NA 139 02/24/2023 1333   NA 142 11/17/2017 1355   K 4.7 02/24/2023 1333   K 4.4 11/17/2017 1355   CL 107 02/24/2023 1333   CL 109 (H) 04/20/2013 0859   CO2 23 02/24/2023 1333   CO2 22 11/17/2017 1355   GLUCOSE 83 02/24/2023 1333   GLUCOSE 86 11/17/2017 1355   GLUCOSE 91 04/20/2013 0859   BUN 17 02/24/2023 1333   BUN 12.5 11/17/2017 1355   CREATININE 1.10 (H) 02/24/2023 1333   CREATININE 1.07 (H) 09/23/2022 1327   CREATININE 1.1 11/17/2017 1355   CALCIUM 8.5 (L) 02/24/2023 1333   CALCIUM 9.2 11/17/2017 1355   PROT 6.8 02/24/2023 1333   PROT 7.0 11/17/2017 1355   ALBUMIN 4.0 02/24/2023 1333   ALBUMIN 3.8  11/17/2017 1355   AST 16 02/24/2023 1333  AST 14 (L) 09/23/2022 1327   AST 23 11/17/2017 1355   ALT 11 02/24/2023 1333   ALT 11 09/23/2022 1327   ALT 22 11/17/2017 1355   ALKPHOS 67 02/24/2023 1333   ALKPHOS 55 11/17/2017 1355   BILITOT 0.7 02/24/2023 1333   BILITOT 0.7 09/23/2022 1327   BILITOT 0.49 11/17/2017 1355   GFRNONAA 55 (L) 02/24/2023 1333   GFRNONAA 57 (L) 09/23/2022 1327   GFRNONAA 53 (L) 05/09/2016 1104   GFRAA 53 (L) 06/12/2020 1146   GFRAA 55 (L) 12/13/2019 1200   GFRAA 61 05/09/2016 1104    No results found for: "TOTALPROTELP", "ALBUMINELP", "A1GS", "A2GS", "BETS", "BETA2SER", "GAMS", "MSPIKE", "SPEI"  Lab Results  Component Value Date   WBC 2.4 (L) 03/24/2023   NEUTROABS 1.7 03/24/2023   HGB 9.8 (L) 03/24/2023   HCT 29.3 (L) 03/24/2023   MCV 98.7 03/24/2023   PLT 91 (L) 03/24/2023    Lab Results  Component Value Date   LABCA2 18 08/21/2012    No components found for: "[id number]"  No results for input(s): "INR" in the last 168 hours.  Lab Results  Component Value Date   LABCA2 18 08/21/2012    No results found for: "[id number]"  No results found for: "CAN125"  No results found for: "[id number]"  Lab Results  Component Value Date   CA2729 26.9 12/13/2019    No components found for: "HGQUANT"  No results found for: "CEA1", "CEA" / No results found for: "CEA1", "CEA"   No results found for: "AFPTUMOR"  No results found for: "CHROMOGRNA"  No results found for: "KPAFRELGTCHN", "LAMBDASER", "KAPLAMBRATIO" (kappa/lambda light chains)  No results found for: "HGBA", "HGBA2QUANT", "HGBFQUANT", "HGBSQUAN" (Hemoglobinopathy evaluation)   Lab Results  Component Value Date   LDH 195 08/21/2012    Lab Results  Component Value Date   IRON 27 (L) 04/11/2016   TIBC 340 04/11/2016   IRONPCTSAT 8 (L) 04/11/2016   (Iron and TIBC)  Lab Results  Component Value Date   FERRITIN 18 04/11/2016    Urinalysis    Component Value Date/Time    COLORURINE YELLOW 06/28/2022 1122   APPEARANCEUR CLEAR 06/28/2022 1122   LABSPEC 1.015 06/28/2022 1122   LABSPEC 1.010 04/29/2016 1118   PHURINE 6.0 06/28/2022 1122   GLUCOSEU NEGATIVE 06/28/2022 1122   GLUCOSEU Negative 04/29/2016 1118   HGBUR NEGATIVE 06/28/2022 1122   BILIRUBINUR NEGATIVE 06/28/2022 1122   BILIRUBINUR Negative 04/29/2016 1118   KETONESUR NEGATIVE 06/28/2022 1122   PROTEINUR NEGATIVE 06/28/2022 1122   UROBILINOGEN 0.2 04/29/2016 1118   NITRITE NEGATIVE 06/28/2022 1122   LEUKOCYTESUR NEGATIVE 06/28/2022 1122   LEUKOCYTESUR Trace 04/29/2016 1118    STUDIES: NM PET Image Restage (PS) Skull Base to Thigh (F-18 FDG)  Result Date: 03/17/2023 CLINICAL DATA:  Subsequent treatment strategy for stage IV metastatic breast cancer. EXAM: NUCLEAR MEDICINE PET SKULL BASE TO THIGH TECHNIQUE: 6.6 mCi F-18 FDG was injected intravenously. Full-ring PET imaging was performed from the skull base to thigh after the radiotracer. CT data was obtained and used for attenuation correction and anatomic localization. Fasting blood glucose: 104 mg/dl COMPARISON:  PET-CT 12/06/2022 and 05/24/2022. FINDINGS: Mediastinal blood pool activity: SUV max 2.3 NECK: No hypermetabolic cervical lymph nodes are identified. No suspicious activity identified within the pharyngeal mucosal space. Incidental CT findings: Asymmetric left carotid atherosclerosis again noted. CHEST: Persistent asymmetric hypermetabolic activity at the right hilum (SUV max 4.7, previously 4.1). No other hypermetabolic mediastinal, hilar, axillary or internal mammary adenopathy. No hypermetabolic pulmonary activity  or suspicious nodularity. Incidental CT findings: No chest wall current status post right mastectomy and axillary node dissection. Right pleural effusion has decreased in volume. A partially loculated left pleural effusion has not significantly changed. Postsurgical changes in the left lower lobe, left upper lobe nodularity and  scattered pulmonary scarring bilaterally are unchanged. ABDOMEN/PELVIS: There is no hypermetabolic activity within the liver, adrenal glands, spleen or pancreas. There is no hypermetabolic nodal activity in the abdomen or pelvis. Incidental CT findings: Similar morphologic changes of cirrhosis with a small amount of ascites. Previous coiling of the gastric varix, a nonobstructing right renal calculus, aortic and branch vessel atherosclerosis are again noted. SKELETON: There is no hypermetabolic activity to suggest osseous metastatic disease. Incidental CT findings: Stable chronic superior endplate compression deformities at L1 and L2 and probable chronic sacral fracture. IMPRESSION: 1. Persistent asymmetric hypermetabolic activity at the right hilum, similar to previous PET-CT and nonspecific. Recommend continued attention on follow-. 2. No other evidence of metastatic disease or local recurrence. 3. Small bilateral pleural effusions, improved in the interval on the right. 4. Stable morphologic changes of cirrhosis and ascites. 5.  Aortic Atherosclerosis (ICD10-I70.0). Electronically Signed   By: Richardean Sale M.D.   On: 03/17/2023 10:30    COMPARISON: Abdominal ultrasound November 05, 2021   TECHNIQUE:  Real time limited abdomen ultrasound was performed.   IMAGE QUALITY: Satisfactory, of diagnostic value.   FINDINGS:   Limited sonographic views of the abdomen demonstrates mild abdominal ascites. There is no large pocket to safely drain.    IMPRESSION:  IMPRESSION:  1. Mild abdominal ascites.  2. No large enough pocket to safely drain.    ASSESSMENT: 67 y.o. [place] woman presenting June 2011 with stage IV breast cancer involving the right breast upper outer quadrant, right axilla, mediastinal lymph nodes, and both lungs, but not the brain, liver, or bones  (1) positive right breast and right axillary lymph node biopsies 07/17/2010 of a clinical T4 N2 M1 invasive ductal carcinoma, grade 1,  estrogen receptor 100% positive, progesterone receptor 6% positive, with an MIB-1 of 12%, and no HER-2 amplification.  (2) participated in Phase II tessetaxel study, receiving 2 cycles complicated by thrombocytopenia, anemia requiring transfusion, transaminase elevation, and afebrile neutropenia. Off-study as of September 2011. Chest CT scan September 2011 did show evidence of response.  (3) on letrozole as of October 2011, with continuing response and good tolerance   (4) 06/02/2012 underwent right mastectomy and axillary lymph node sampling (3 lymph nodes removed, all with viable cancer as well as evidence of treatment effect) for a ypT2 ypN1-2 invasive ductal carcinoma, grade 1, 98% estrogen receptor positive, progesterone receptor negative, with no HER-2 amplification  (5) left jugular vein DVT documented 06/16/2012; left sided Port-A-Cath removed mid July 2015; Coumadin stopped 09/07/2014.  (a) Left brachiocephalic v. slightly narrowed, with some Left lower neck collateralization noted on chest CT December 2015  (6) osteoporosis, was on alendronate, switched to denosumab/Prolia 11/07/2016  (a) bone density in 10/2018 shows osteopenia T score of -2.2  (7) portal hypertension, with splenomegaly, gastric varices, and intestinal edema  (a)  mild persistent leukopenia and thrombocytopenia likely due to splenomegaly  (b) iron deficiency secondary to above, status post Feraheme x2 December 2018  (8) restaging studies:  (a) chest CT 12/26/2017 shows a 4.5 mm left lower lobe pulmonary nodule which is unchanged.  There was no other evidence of active disease  (b) restaging chest CT in 05/2019 shows no change in bilateral pulmonary nodules, prominent mediastinal  lymph nodes are stable  (9) CT scan of the abdomen 11/03/2021 shows thrombus in the portal, splenic and superior mesenteric vein  (A) Heparin started 11/03/2021  10. PET scan [id number], low metabolic activity of right lower pulmonary  nodule. No change in size of CT 02/27/2022, Carcinoma with mucinous features consistent with metastatic breast carcinoma, negative margins, no LN involvement.  Prognostics showed ER +100% strong staining, PR +20% staining, Ki-67 of 10% and HER2 2+ by IHC and negative by FISH  PLAN:  Given metastatic breast cancer progression on letrozole,she is now on Faslodex with Ibrance.   She is currently on this combination and is tolerating it very well.   She has baseline thrombocytopenia due to splenomegaly, this has gotten worse, hence we changed the Ibrance to 75 mg po daily.  She has been tolerating this dose reasonably well.  Most recent imaging with no new evidence of disease or progression. She also continues on Prolia every 6 months for osteoporosis, last bone density in March 2023 with a T score of -2.4 improved.  This is due now.  She denies any new dental concerns today.  No concerns on ROS or PE today She will continue current combination, proceed with faslodex today Repeat imaging in 6 months. CBC today reviewed, stable, CMP pending.   Total time spent: 30 min  *Total Encounter Time as defined by the Centers for Medicare and Medicaid Services includes, in addition to the face-to-face time of a patient visit (documented in the note above) non-face-to-face time: obtaining and reviewing outside history, ordering and reviewing medications, tests or procedures, care coordination (communications with other health care professionals or caregivers) and documentation in the medical record.

## 2023-04-16 ENCOUNTER — Other Ambulatory Visit: Payer: Self-pay | Admitting: Hematology and Oncology

## 2023-04-21 ENCOUNTER — Inpatient Hospital Stay: Payer: Medicare Other | Attending: Hematology and Oncology

## 2023-04-21 ENCOUNTER — Inpatient Hospital Stay: Payer: Medicare Other

## 2023-04-21 VITALS — BP 120/70 | HR 99 | Temp 98.6°F | Resp 16

## 2023-04-21 DIAGNOSIS — C778 Secondary and unspecified malignant neoplasm of lymph nodes of multiple regions: Secondary | ICD-10-CM | POA: Insufficient documentation

## 2023-04-21 DIAGNOSIS — C7801 Secondary malignant neoplasm of right lung: Secondary | ICD-10-CM | POA: Diagnosis not present

## 2023-04-21 DIAGNOSIS — M810 Age-related osteoporosis without current pathological fracture: Secondary | ICD-10-CM

## 2023-04-21 DIAGNOSIS — Z5111 Encounter for antineoplastic chemotherapy: Secondary | ICD-10-CM | POA: Insufficient documentation

## 2023-04-21 DIAGNOSIS — Z170 Estrogen receptor positive status [ER+]: Secondary | ICD-10-CM | POA: Diagnosis not present

## 2023-04-21 DIAGNOSIS — C7802 Secondary malignant neoplasm of left lung: Secondary | ICD-10-CM | POA: Insufficient documentation

## 2023-04-21 DIAGNOSIS — C50411 Malignant neoplasm of upper-outer quadrant of right female breast: Secondary | ICD-10-CM | POA: Diagnosis not present

## 2023-04-21 DIAGNOSIS — C50911 Malignant neoplasm of unspecified site of right female breast: Secondary | ICD-10-CM

## 2023-04-21 LAB — COMPREHENSIVE METABOLIC PANEL
ALT: 10 U/L (ref 0–44)
AST: 15 U/L (ref 15–41)
Albumin: 3.9 g/dL (ref 3.5–5.0)
Alkaline Phosphatase: 65 U/L (ref 38–126)
Anion gap: 10 (ref 5–15)
BUN: 18 mg/dL (ref 8–23)
CO2: 22 mmol/L (ref 22–32)
Calcium: 8.9 mg/dL (ref 8.9–10.3)
Chloride: 107 mmol/L (ref 98–111)
Creatinine, Ser: 1.18 mg/dL — ABNORMAL HIGH (ref 0.44–1.00)
GFR, Estimated: 50 mL/min — ABNORMAL LOW (ref >60)
Glucose, Bld: 94 mg/dL (ref 70–99)
Potassium: 4.3 mmol/L (ref 3.5–5.1)
Sodium: 139 mmol/L (ref 135–145)
Total Bilirubin: 0.8 mg/dL (ref 0.3–1.2)
Total Protein: 6.8 g/dL (ref 6.5–8.1)

## 2023-04-21 LAB — CBC WITH DIFFERENTIAL/PLATELET
Abs Immature Granulocytes: 0.02 10*3/uL (ref 0.00–0.07)
Basophils Absolute: 0 10*3/uL (ref 0.0–0.1)
Basophils Relative: 1 %
Eosinophils Absolute: 0.1 10*3/uL (ref 0.0–0.5)
Eosinophils Relative: 2 %
HCT: 27 % — ABNORMAL LOW (ref 36.0–46.0)
Hemoglobin: 9 g/dL — ABNORMAL LOW (ref 12.0–15.0)
Immature Granulocytes: 1 %
Lymphocytes Relative: 13 %
Lymphs Abs: 0.4 10*3/uL — ABNORMAL LOW (ref 0.7–4.0)
MCH: 32.1 pg (ref 26.0–34.0)
MCHC: 33.3 g/dL (ref 30.0–36.0)
MCV: 96.4 fL (ref 80.0–100.0)
Monocytes Absolute: 0.2 10*3/uL (ref 0.1–1.0)
Monocytes Relative: 8 %
Neutro Abs: 2.2 10*3/uL (ref 1.7–7.7)
Neutrophils Relative %: 75 %
Platelets: 92 10*3/uL — ABNORMAL LOW (ref 150–400)
RBC: 2.8 MIL/uL — ABNORMAL LOW (ref 3.87–5.11)
RDW: 17.2 % — ABNORMAL HIGH (ref 11.5–15.5)
WBC: 2.9 10*3/uL — ABNORMAL LOW (ref 4.0–10.5)
nRBC: 0 % (ref 0.0–0.2)

## 2023-04-21 MED ORDER — FULVESTRANT 250 MG/5ML IM SOSY
500.0000 mg | PREFILLED_SYRINGE | Freq: Once | INTRAMUSCULAR | Status: AC
  Administered 2023-04-21: 500 mg via INTRAMUSCULAR
  Filled 2023-04-21: qty 10

## 2023-05-20 ENCOUNTER — Inpatient Hospital Stay: Payer: Medicare Other

## 2023-05-20 ENCOUNTER — Telehealth: Payer: Self-pay | Admitting: Hematology and Oncology

## 2023-05-20 ENCOUNTER — Inpatient Hospital Stay: Payer: Medicare Other | Admitting: Hematology and Oncology

## 2023-05-27 ENCOUNTER — Inpatient Hospital Stay: Payer: Medicare Other

## 2023-05-27 ENCOUNTER — Other Ambulatory Visit: Payer: Self-pay | Admitting: *Deleted

## 2023-05-27 ENCOUNTER — Inpatient Hospital Stay (HOSPITAL_BASED_OUTPATIENT_CLINIC_OR_DEPARTMENT_OTHER): Payer: Medicare Other | Admitting: Hematology and Oncology

## 2023-05-27 ENCOUNTER — Inpatient Hospital Stay: Payer: Medicare Other | Attending: Hematology and Oncology

## 2023-05-27 VITALS — BP 140/63 | HR 101 | Temp 97.5°F | Wt 138.4 lb

## 2023-05-27 DIAGNOSIS — R188 Other ascites: Secondary | ICD-10-CM | POA: Insufficient documentation

## 2023-05-27 DIAGNOSIS — R0602 Shortness of breath: Secondary | ICD-10-CM | POA: Insufficient documentation

## 2023-05-27 DIAGNOSIS — D509 Iron deficiency anemia, unspecified: Secondary | ICD-10-CM

## 2023-05-27 DIAGNOSIS — Z170 Estrogen receptor positive status [ER+]: Secondary | ICD-10-CM

## 2023-05-27 DIAGNOSIS — C50411 Malignant neoplasm of upper-outer quadrant of right female breast: Secondary | ICD-10-CM | POA: Insufficient documentation

## 2023-05-27 DIAGNOSIS — D6959 Other secondary thrombocytopenia: Secondary | ICD-10-CM | POA: Insufficient documentation

## 2023-05-27 DIAGNOSIS — R918 Other nonspecific abnormal finding of lung field: Secondary | ICD-10-CM | POA: Insufficient documentation

## 2023-05-27 DIAGNOSIS — M810 Age-related osteoporosis without current pathological fracture: Secondary | ICD-10-CM

## 2023-05-27 DIAGNOSIS — K766 Portal hypertension: Secondary | ICD-10-CM | POA: Diagnosis not present

## 2023-05-27 DIAGNOSIS — Z5111 Encounter for antineoplastic chemotherapy: Secondary | ICD-10-CM | POA: Insufficient documentation

## 2023-05-27 DIAGNOSIS — C778 Secondary and unspecified malignant neoplasm of lymph nodes of multiple regions: Secondary | ICD-10-CM | POA: Diagnosis not present

## 2023-05-27 DIAGNOSIS — R5383 Other fatigue: Secondary | ICD-10-CM | POA: Insufficient documentation

## 2023-05-27 DIAGNOSIS — E611 Iron deficiency: Secondary | ICD-10-CM | POA: Diagnosis not present

## 2023-05-27 DIAGNOSIS — C50911 Malignant neoplasm of unspecified site of right female breast: Secondary | ICD-10-CM

## 2023-05-27 DIAGNOSIS — D508 Other iron deficiency anemias: Secondary | ICD-10-CM

## 2023-05-27 DIAGNOSIS — R161 Splenomegaly, not elsewhere classified: Secondary | ICD-10-CM | POA: Diagnosis not present

## 2023-05-27 LAB — CBC WITH DIFFERENTIAL/PLATELET
Abs Immature Granulocytes: 0.01 10*3/uL (ref 0.00–0.07)
Basophils Absolute: 0 10*3/uL (ref 0.0–0.1)
Basophils Relative: 1 %
Eosinophils Absolute: 0 10*3/uL (ref 0.0–0.5)
Eosinophils Relative: 1 %
HCT: 21.1 % — ABNORMAL LOW (ref 36.0–46.0)
Hemoglobin: 6.4 g/dL — CL (ref 12.0–15.0)
Immature Granulocytes: 0 %
Lymphocytes Relative: 14 %
Lymphs Abs: 0.4 10*3/uL — ABNORMAL LOW (ref 0.7–4.0)
MCH: 29.9 pg (ref 26.0–34.0)
MCHC: 30.3 g/dL (ref 30.0–36.0)
MCV: 98.6 fL (ref 80.0–100.0)
Monocytes Absolute: 0.3 10*3/uL (ref 0.1–1.0)
Monocytes Relative: 10 %
Neutro Abs: 2.1 10*3/uL (ref 1.7–7.7)
Neutrophils Relative %: 74 %
Platelets: 99 10*3/uL — ABNORMAL LOW (ref 150–400)
RBC: 2.14 MIL/uL — ABNORMAL LOW (ref 3.87–5.11)
RDW: 21.6 % — ABNORMAL HIGH (ref 11.5–15.5)
WBC: 2.8 10*3/uL — ABNORMAL LOW (ref 4.0–10.5)
nRBC: 0 % (ref 0.0–0.2)

## 2023-05-27 LAB — SAMPLE TO BLOOD BANK

## 2023-05-27 LAB — COMPREHENSIVE METABOLIC PANEL
ALT: 9 U/L (ref 0–44)
AST: 14 U/L — ABNORMAL LOW (ref 15–41)
Albumin: 3.8 g/dL (ref 3.5–5.0)
Alkaline Phosphatase: 66 U/L (ref 38–126)
Anion gap: 10 (ref 5–15)
BUN: 18 mg/dL (ref 8–23)
CO2: 20 mmol/L — ABNORMAL LOW (ref 22–32)
Calcium: 8.3 mg/dL — ABNORMAL LOW (ref 8.9–10.3)
Chloride: 107 mmol/L (ref 98–111)
Creatinine, Ser: 0.99 mg/dL (ref 0.44–1.00)
GFR, Estimated: >60 mL/min (ref >60)
Glucose, Bld: 109 mg/dL — ABNORMAL HIGH (ref 70–99)
Potassium: 3.8 mmol/L (ref 3.5–5.1)
Sodium: 137 mmol/L (ref 135–145)
Total Bilirubin: 0.7 mg/dL (ref 0.3–1.2)
Total Protein: 6.4 g/dL — ABNORMAL LOW (ref 6.5–8.1)

## 2023-05-27 LAB — BPAM RBC
Blood Product Expiration Date: 202406222359
Blood Product Expiration Date: 202406292359
Unit Type and Rh: 5100

## 2023-05-27 LAB — PREPARE RBC (CROSSMATCH)

## 2023-05-27 LAB — TYPE AND SCREEN

## 2023-05-27 MED ORDER — FULVESTRANT 250 MG/5ML IM SOSY
500.0000 mg | PREFILLED_SYRINGE | Freq: Once | INTRAMUSCULAR | Status: AC
  Administered 2023-05-27: 500 mg via INTRAMUSCULAR
  Filled 2023-05-27: qty 10

## 2023-05-27 NOTE — Progress Notes (Signed)
[facility]  Telephone:([phone] Fax:([phone]    ID: JET DIETIKER   DOB: 07-07-1956  MR#: [id number]  JYN#:[id number]  Patient Care Team: Rachel Moulds, MD as PCP - General (Hematology and Oncology) Annamarie Major, CRNP as Nurse Practitioner (Nurse Practitioner) [name], MD as Consulting Physician (General Surgery) Carman Ching, MD (Inactive) as Consulting Physician (Gastroenterology) Kerin Salen, MD as Consulting Physician (Gastroenterology)  CHIEF COMPLAINT:  Metastatic Breast Cancer (s/p right mastectomy)  CURRENT TREATMENT: Ilda Foil and Faslodex  INTERVAL HISTORY:  Nicole [name] returns today for follow-up of her estrogen receptor positive stage IV breast cancer.   She is not doing well today. She feels exhausted today. She says for the past 2 weeks, she has been having, cough, SOB, hip pains and has been feeling very tired, She has been feeling little bloated as well.  Mainly to hold the Ibrance Rest of the pertinent 10 point ROS reviewed and negative  COVID 19 VACCINATION STATUS: Status post Pfizer x2+ booster August 2021   BREAST CANCER HISTORY: From Dr. Theron Arista Rubin's 07/25/2010 note:  "This woman has not had any significant medical intervention for some time.  Her last mammogram was about seven years ago.  She noted a right breast mass about a year ago and did not seek immediate medical attention for this.  She ultimately, after some time, admitted this to her husband and self-referred herself for intervention.  She had a mammogram on 07/17/2010 with an ultrasound of the right breast.  This showed a lobulated mass upper right outer quadrant measuring at least 6.3 x 7.3 cm and large right axillary lymph nodes were also noted.  There was skin thickening overlying the mass.  On physical exam, this mass was about an 8 cm with skin dimpling noted. Discolored area in the skin over the mass, fullness in the axilla.  Biopsy was recommended, which took place  on 07/17/2010.  Pathology showed invasive ductal cancer involving both lymph node and breast.  The HER-2 was not amplified.  ER and PR both positive at 100% and 6% respectively.  Proliferative index was 12% involving both the lymph node and breast mass.  An MRI scan of both breasts was performed on 07/22/2010, essentially which showed a large heterogeneously enhancing mass of the right breast with washout kinetics measuring 6.4 x 4.4 x 5.0 cm.  Numerous satellite nodules were seen throughout the dominant mass.  There were several suspicious nodules in the upper central and upper inner quadrant of the right breast.  There were two enhancing subcentimeter nodules seen in the right pectoralis muscle.  No obvious chest wall nodules were seen; however, there was seen some metastatic adenopathy in the mediastinum as well as hila with a 2.4 x 2.0 cm mediastinal lymph node as well as 1.2 x 3.0 cm subcarinal lymph node.  There was a right hilar and probable left hilar adenopathy.  Bilateral pulmonary nodules were seen and a right T2 lesion was seen anterior right liver as well."  Her subsequent history is as detailed below.   PAST MEDICAL HISTORY: Past Medical History:  Diagnosis Date   Anemia    low iron   Arthritis    Blood transfusion 2012   Breast CA (HCC)    breast ca dx 06/2010, stage 4, right , Lung Metasis.Surgery, Chemo, Radiation   Breast cancer (HCC)    Clot    jugular  ?2015   Depression    Esophageal varices (HCC)    Headache  Mirgraine- rare since menopause   Heart murmur    mild, no cardiologist, from birth   Hematemesis 08/2017   History of kidney stones    no pain- shows in CT- small 1 mm bilaterally- kidney   Hypertension    IBD (inflammatory bowel disease)    resolved   Idiopathic cirrhosis (HCC)    pt denies   Peripheral vascular disease (HCC) few yrs ago   clot in right jugular   Portal hypertension (HCC)    Radiation 07/21/12-09/07/12   5940 cGy   Restless legs      PAST SURGICAL HISTORY: Past Surgical History:  Procedure Laterality Date   BIOPSY  08/21/2020   Procedure: BIOPSY;  Surgeon: Kerin Salen, MD;  Location: [facility] ENDOSCOPY;  Service: Gastroenterology;;   COLONOSCOPY  2016   DILATION AND CURETTAGE OF UTERUS  2004?   ESOPHAGEAL BANDING  09/04/2017   Procedure: ESOPHAGEAL BANDING;  Surgeon: Kerin Salen, MD;  Location: [facility] ENDOSCOPY;  Service: Gastroenterology;;   ESOPHAGEAL BANDING  11/27/2017   Procedure: ESOPHAGEAL BANDING;  Surgeon: Kerin Salen, MD;  Location: [facility] ENDOSCOPY;  Service: Gastroenterology;;   ESOPHAGEAL BANDING N/A 04/28/2018   Procedure: ESOPHAGEAL BANDING;  Surgeon: Kerin Salen, MD;  Location: [facility] ENDOSCOPY;  Service: Gastroenterology;  Laterality: N/A;   ESOPHAGEAL BANDING  11/17/2018   Procedure: ESOPHAGEAL BANDING;  Surgeon: Kerin Salen, MD;  Location: [facility] ENDOSCOPY;  Service: Gastroenterology;;   ESOPHAGEAL BANDING  08/16/2019   Procedure: ESOPHAGEAL BANDING;  Surgeon: Kerin Salen, MD;  Location: [facility] ENDOSCOPY;  Service: Gastroenterology;;   ESOPHAGEAL BANDING N/A 08/21/2020   Procedure: ESOPHAGEAL BANDING;  Surgeon: Kerin Salen, MD;  Location: [facility]COPY;  Service: Gastroenterology;  Laterality: N/A;   ESOPHAGOGASTRODUODENOSCOPY N/A 04/28/2018   Procedure: ESOPHAGOGASTRODUODENOSCOPY (EGD);  Surgeon: Kerin Salen, MD;  Location: [facility] ENDOSCOPY;  Service: Gastroenterology;  Laterality: N/A;   ESOPHAGOGASTRODUODENOSCOPY N/A 11/17/2018   Procedure: ESOPHAGOGASTRODUODENOSCOPY (EGD);  Surgeon: Kerin Salen, MD;  Location: [facility] ENDOSCOPY;  Service: Gastroenterology;  Laterality: N/A;   ESOPHAGOGASTRODUODENOSCOPY N/A 08/13/2022   Procedure: ESOPHAGOGASTRODUODENOSCOPY (EGD);  Surgeon: Kerin Salen, MD;  Location: [facility] ENDOSCOPY;  Service: Gastroenterology;  Laterality: N/A;   ESOPHAGOGASTRODUODENOSCOPY (EGD) WITH PROPOFOL N/A 04/11/2016   Procedure: ESOPHAGOGASTRODUODENOSCOPY (EGD) WITH PROPOFOL;  Surgeon: Vida Rigger, MD;  Location: [facility] ENDOSCOPY;   Service: Endoscopy;  Laterality: N/A;   ESOPHAGOGASTRODUODENOSCOPY (EGD) WITH PROPOFOL N/A 07/08/2016   Procedure: ESOPHAGOGASTRODUODENOSCOPY (EGD) WITH PROPOFOL;  Surgeon: Carman Ching, MD;  Location: [facility] ENDOSCOPY;  Service: Endoscopy;  Laterality: N/A;   ESOPHAGOGASTRODUODENOSCOPY (EGD) WITH PROPOFOL N/A 03/03/2017   Procedure: ESOPHAGOGASTRODUODENOSCOPY (EGD) WITH PROPOFOL;  Surgeon: Carman Ching, MD;  Location: [facility] ENDOSCOPY;  Service: Endoscopy;  Laterality: N/A;   ESOPHAGOGASTRODUODENOSCOPY (EGD) WITH PROPOFOL N/A 09/04/2017   Procedure: ESOPHAGOGASTRODUODENOSCOPY (EGD) WITH PROPOFOL;  Surgeon: Kerin Salen, MD;  Location: [facility] ENDOSCOPY;  Service: Gastroenterology;  Laterality: N/A;   ESOPHAGOGASTRODUODENOSCOPY (EGD) WITH PROPOFOL N/A 11/27/2017   Procedure: ESOPHAGOGASTRODUODENOSCOPY (EGD);  Surgeon: Kerin Salen, MD;  Location: [facility] ENDOSCOPY;  Service: Gastroenterology;  Laterality: N/A;   ESOPHAGOGASTRODUODENOSCOPY (EGD) WITH PROPOFOL N/A 08/16/2019   Procedure: ESOPHAGOGASTRODUODENOSCOPY (EGD) WITH PROPOFOL;  Surgeon: Kerin Salen, MD;  Location: [facility] ENDOSCOPY;  Service: Gastroenterology;  Laterality: N/A;   ESOPHAGOGASTRODUODENOSCOPY (EGD) WITH PROPOFOL N/A 08/21/2020   Procedure: ESOPHAGOGASTRODUODENOSCOPY (EGD) WITH PROPOFOL;  Surgeon: Kerin Salen, MD;  Location: [facility] ENDOSCOPY;  Service: Gastroenterology;  Laterality: N/A;   ESOPHAGOGASTRODUODENOSCOPY (EGD) WITH PROPOFOL N/A 08/24/2021   Procedure: ESOPHAGOGASTRODUODENOSCOPY (EGD) WITH PROPOFOL;  Surgeon: Kerin Salen, MD;  Location: [facility] ENDOSCOPY;  Service:  Gastroenterology;  Laterality: N/A;   GASTRIC VARICES BANDING N/A 08/24/2021   Procedure: GASTRIC VARICES BANDING;  Surgeon: Kerin Salen, MD;  Location: [facility] ENDOSCOPY;  Service: Gastroenterology;  Laterality: N/A;   GASTRIC VARICES BANDING N/A 08/13/2022   Procedure: GASTRIC VARICES BANDING;  Surgeon: Kerin Salen, MD;  Location: [facility] ENDOSCOPY;  Service: Gastroenterology;  Laterality: N/A;    HEMOSTASIS CLIP PLACEMENT  08/21/2020   Procedure: HEMOSTASIS CLIP PLACEMENT;  Surgeon: Kerin Salen, MD;  Location: [facility] ENDOSCOPY;  Service: Gastroenterology;;   INTERCOSTAL NERVE BLOCK Left 07/01/2022   Procedure: INTERCOSTAL NERVE BLOCK;  Surgeon: Loreli Slot, MD;  Location: [facility] OR;  Service: Thoracic;  Laterality: Left;   IR GENERIC HISTORICAL  05/15/2016   IR RADIOLOGIST EVAL & MGMT 05/15/2016 Gilmer Mor, DO [facility] INTERV RAD   IR GENERIC HISTORICAL  01/14/2017   IR US GUIDE VASC ACCESS RIGHT 01/14/2017 Berdine Dance, MD [facility]   IR GENERIC HISTORICAL  01/14/2017   IR VENOGRAM HEPATIC W HEMODYNAMIC EVALUATION 01/14/2017 Berdine Dance, MD [facility]   IR RADIOLOGIST EVAL & MGMT  06/12/2017   IR RADIOLOGIST EVAL & MGMT  08/11/2018   IR RADIOLOGIST EVAL & MGMT  08/25/2019   IR RADIOLOGIST EVAL & MGMT  08/22/2020   IR RADIOLOGIST EVAL & MGMT  03/11/2022   MASTECTOMY Right 06/02/12   right breast with lymph node removal   NODE DISSECTION Left 07/01/2022   Procedure: NODE DISSECTION;  Surgeon: Loreli Slot, MD;  Location: [facility] OR;  Service: Thoracic;  Laterality: Left;   PORT-A-CATH REMOVAL     PORTACATH PLACEMENT  2011   left side   RADIOLOGY WITH ANESTHESIA N/A 04/13/2016   Procedure: RADIOLOGY WITH ANESTHESIA;  Surgeon: Gilmer Mor, DO;  Location: [facility] OR;  Service: Anesthesiology;  Laterality: N/A;   VIDEO BRONCHOSCOPY WITH ENDOBRONCHIAL NAVIGATION N/A 07/01/2022   Procedure: VIDEO BRONCHOSCOPY WITH ENDOBRONCHIAL NAVIGATION;  Surgeon: Loreli Slot, MD;  Location: [facility] OR;  Service: Thoracic;  Laterality: N/A;   XI ROBOTIC ASSISTED THORACOSCOPY- SEGMENTECTOMY Left 07/01/2022   Procedure: XI ROBOTIC ASSISTED THORACOSCOPY-LEFT LOWER LOBE BASILAR SEGMENTECTOMY;  Surgeon: Loreli Slot, MD;  Location: [facility] OR;  Service: Thoracic;  Laterality: Left;    FAMILY HISTORY Family History  Problem Relation Age of Onset   COPD Mother    AAA (abdominal aortic aneurysm) Mother     COPD Father    Cancer Neg Hx   The patient's father died at the age of 27 from heart disease. Patient's mother died at the age of 19 status post CABG. The patient had no brothers, 2 sisters. There is no history of breast or ovarian cancer in the family.   GYNECOLOGIC HISTORY: Updated May 2018 Menarche age 49, she is GX P0. She does not recall when she went through menopause. She never took hormone replacement.   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 Husband Nicole [name] is retired from [employer]. They have no children and no pets. They attend a local [address].    ADVANCED DIRECTIVES: Not in place   HEALTH MAINTENANCE: Social History   Tobacco Use   Smoking status: Never   Smokeless tobacco: Never  Vaping Use   Vaping Use: Never used  Substance Use Topics   Alcohol use: No   Drug use: No     Colonoscopy: Never  PAP: Remote  Bone density: 09/27/2013, osteoporosis with a  T score of -2.8  Lipid panel: Not on file    No Active Allergies   Current Outpatient Medications  Medication Sig Dispense Refill   denosumab (PROLIA) 60 MG/ML SOLN injection Inject 60 mg into the skin every 6 (six) months. Administer in upper arm, thigh, or abdomen     ELIQUIS 5 MG TABS tablet TAKE 1 TABLET BY MOUTH TWICE A DAY 60 tablet 2   folic acid (FOLVITE) 1 MG tablet Take 1 mg by mouth daily.     Milk Thistle 1000 MG CAPS Take 1,000 mg by mouth daily.     palbociclib (IBRANCE) 75 MG tablet Take 1 tablet (75 mg total) by mouth daily. Take for 21 days on, 7 days off, repeat every 28 days. 21 tablet 3   pantoprazole (PROTONIX) 40 MG tablet Take 1 tablet (40 mg total) by mouth daily. 60 tablet 0   propranolol (INDERAL) 20 MG tablet Take 1 tablet (20 mg total) by mouth 2 (two) times daily. 60 tablet 0   venlafaxine XR (EFFEXOR-XR) 150 MG 24 hr capsule Take 1 capsule (150 mg total) by mouth daily  with breakfast. 90 capsule 1   No current facility-administered medications for this visit.   Facility-Administered Medications Ordered in Other Visits  Medication Dose Route Frequency Provider Last Rate Last Admin   denosumab (PROLIA) injection 60 mg  60 mg Subcutaneous Once Magrinat, Valentino Hue, MD        OBJECTIVE: white woman who appears younger than stated age  Vitals:   05/27/23 1438  BP: (!) 140/63  Pulse: (!) 101  Temp: (!) 97.5 F (36.4 C)  TempSrc: Tympanic  SpO2: 100%  Weight: 138 lb 6.4 oz (62.8 kg)   Physical Exam Constitutional:      Appearance: Normal appearance.     Comments: Appears pale   Cardiovascular:     Rate and Rhythm: Normal rate and regular rhythm.     Pulses: Normal pulses.     Heart sounds: Normal heart sounds.  Pulmonary:     Effort: Pulmonary effort is normal.     Breath sounds: Normal breath sounds.  Abdominal:     General: There is distension.  Musculoskeletal:        General: No swelling or tenderness. Normal range of motion.     Cervical back: Normal range of motion. No rigidity.  Lymphadenopathy:     Cervical: No cervical adenopathy.  Skin:    General: Skin is warm and dry.  Neurological:     General: No focal deficit present.     Mental Status: She is alert.    LAB RESULTS:  CMP     Component Value Date/Time   NA 137 05/27/2023 1420   NA 142 11/17/2017 1355   K 3.8 05/27/2023 1420   K 4.4 11/17/2017 1355   CL 107 05/27/2023 1420   CL 109 (H) 04/20/2013 0859   CO2 20 (L) 05/27/2023 1420   CO2 22 11/17/2017 1355   GLUCOSE 109 (H) 05/27/2023 1420   GLUCOSE 86 11/17/2017 1355   GLUCOSE 91 04/20/2013 0859   BUN 18 05/27/2023 1420   BUN 12.5 11/17/2017 1355   CREATININE 0.99 05/27/2023 1420   CREATININE 1.07 (H) 09/23/2022 1327   CREATININE 1.1 11/17/2017 1355   CALCIUM 8.3 (L) 05/27/2023 1420   CALCIUM 9.2 11/17/2017 1355   PROT 6.4 (L) 05/27/2023 1420   PROT 7.0 11/17/2017 1355   ALBUMIN 3.8 05/27/2023 1420    ALBUMIN 3.8 11/17/2017 1355  AST 14 (L) 05/27/2023 1420   AST 14 (L) 09/23/2022 1327   AST 23 11/17/2017 1355   ALT 9 05/27/2023 1420   ALT 11 09/23/2022 1327   ALT 22 11/17/2017 1355   ALKPHOS 66 05/27/2023 1420   ALKPHOS 55 11/17/2017 1355   BILITOT 0.7 05/27/2023 1420   BILITOT 0.7 09/23/2022 1327   BILITOT 0.49 11/17/2017 1355   GFRNONAA >60 05/27/2023 1420   GFRNONAA 57 (L) 09/23/2022 1327   GFRNONAA 53 (L) 05/09/2016 1104   GFRAA 53 (L) 06/12/2020 1146   GFRAA 55 (L) 12/13/2019 1200   GFRAA 61 05/09/2016 1104    No results found for: "TOTALPROTELP", "ALBUMINELP", "A1GS", "A2GS", "BETS", "BETA2SER", "GAMS", "MSPIKE", "SPEI"  Lab Results  Component Value Date   WBC 2.8 (L) 05/27/2023   NEUTROABS 2.1 05/27/2023   HGB 6.4 (LL) 05/27/2023   HCT 21.1 (L) 05/27/2023   MCV 98.6 05/27/2023   PLT 99 (L) 05/27/2023    Lab Results  Component Value Date   LABCA2 18 08/21/2012    No components found for: "ZOXWRU045"  No results for input(s): "INR" in the last 168 hours.  Lab Results  Component Value Date   LABCA2 18 08/21/2012    No results found for: "WUJ811"  No results found for: "CAN125"  No results found for: "BJY782"  Lab Results  Component Value Date   CA2729 26.9 12/13/2019    No components found for: "HGQUANT"  No results found for: "CEA1", "CEA" / No results found for: "CEA1", "CEA"   No results found for: "AFPTUMOR"  No results found for: "CHROMOGRNA"  No results found for: "KPAFRELGTCHN", "LAMBDASER", "KAPLAMBRATIO" (kappa/lambda light chains)  No results found for: "HGBA", "HGBA2QUANT", "HGBFQUANT", "HGBSQUAN" (Hemoglobinopathy evaluation)   Lab Results  Component Value Date   LDH 195 08/21/2012    Lab Results  Component Value Date   IRON 27 (L) 04/11/2016   TIBC 340 04/11/2016   IRONPCTSAT 8 (L) 04/11/2016   (Iron and TIBC)  Lab Results  Component Value Date   FERRITIN 18 04/11/2016    Urinalysis    Component Value  Date/Time   COLORURINE YELLOW 06/28/2022 1122   APPEARANCEUR CLEAR 06/28/2022 1122   LABSPEC 1.015 06/28/2022 1122   LABSPEC 1.010 04/29/2016 1118   PHURINE 6.0 06/28/2022 1122   GLUCOSEU NEGATIVE 06/28/2022 1122   GLUCOSEU Negative 04/29/2016 1118   HGBUR NEGATIVE 06/28/2022 1122   BILIRUBINUR NEGATIVE 06/28/2022 1122   BILIRUBINUR Negative 04/29/2016 1118   KETONESUR NEGATIVE 06/28/2022 1122   PROTEINUR NEGATIVE 06/28/2022 1122   UROBILINOGEN 0.2 04/29/2016 1118   NITRITE NEGATIVE 06/28/2022 1122   LEUKOCYTESUR NEGATIVE 06/28/2022 1122   LEUKOCYTESUR Trace 04/29/2016 1118    STUDIES: No results found.  COMPARISON: Abdominal ultrasound November 05, 2021   TECHNIQUE:  Real time limited abdomen ultrasound was performed.   IMAGE QUALITY: Satisfactory, of diagnostic value.   FINDINGS:   Limited sonographic views of the abdomen demonstrates mild abdominal ascites. There is no large pocket to safely drain.    IMPRESSION:  IMPRESSION:  1. Mild abdominal ascites.  2. No large enough pocket to safely drain.    ASSESSMENT: 67 y.o. [place] woman presenting June 2011 with stage IV breast cancer involving the right breast upper outer quadrant, right axilla, mediastinal lymph nodes, and both lungs, but not the brain, liver, or bones  (1) positive right breast and right axillary lymph node biopsies 07/17/2010 of a clinical T4 N2 M1 invasive ductal carcinoma, grade 1, estrogen receptor 100%  positive, progesterone receptor 6% positive, with an MIB-1 of 12%, and no HER-2 amplification.  (2) participated in Phase II tessetaxel study, receiving 2 cycles complicated by thrombocytopenia, anemia requiring transfusion, transaminase elevation, and afebrile neutropenia. Off-study as of September 2011. Chest CT scan September 2011 did show evidence of response.  (3) on letrozole as of October 2011, with continuing response and good tolerance   (4) 06/02/2012 underwent right mastectomy and  axillary lymph node sampling (3 lymph nodes removed, all with viable cancer as well as evidence of treatment effect) for a ypT2 ypN1-2 invasive ductal carcinoma, grade 1, 98% estrogen receptor positive, progesterone receptor negative, with no HER-2 amplification  (5) left jugular vein DVT documented 06/16/2012; left sided Port-A-Cath removed mid July 2015; Coumadin stopped 09/07/2014.  (a) Left brachiocephalic v. slightly narrowed, with some Left lower neck collateralization noted on chest CT December 2015  (6) osteoporosis, was on alendronate, switched to denosumab/Prolia 11/07/2016  (a) bone density in 10/2018 shows osteopenia T score of -2.2  (7) portal hypertension, with splenomegaly, gastric varices, and intestinal edema  (a)  mild persistent leukopenia and thrombocytopenia likely due to splenomegaly  (b) iron deficiency secondary to above, status post Feraheme x2 December 2018  (8) restaging studies:  (a) chest CT 12/26/2017 shows a 4.5 mm left lower lobe pulmonary nodule which is unchanged.  There was no other evidence of active disease  (b) restaging chest CT in 05/2019 shows no change in bilateral pulmonary nodules, prominent mediastinal lymph nodes are stable  (9) CT scan of the abdomen 11/03/2021 shows thrombus in the portal, splenic and superior mesenteric vein  (A) Heparin started 11/03/2021  10. PET scan 05/24/2022, low metabolic activity of right lower pulmonary nodule. No change in size of CT 02/27/2022, Carcinoma with mucinous features consistent with metastatic breast carcinoma, negative margins, no LN involvement.  Prognostics showed ER +100% strong staining, PR +20% staining, Ki-67 of 10% and HER2 2+ by IHC and negative by FISH  PLAN:  Given metastatic breast cancer progression on letrozole,she is now on Faslodex with Ibrance.   She is currently on this combination and is tolerating it very well.   She has baseline thrombocytopenia due to splenomegaly, this has gotten worse,  hence we changed the Ibrance to 75 mg po daily.  She has been tolerating this dose reasonably well.  Most recent imaging with no new evidence of disease or progression. She also continues on Prolia every 6 months for osteoporosis, last bone density in March 2023 with a T score of -2.4 improved.   Today, she complains of fatigue, shortness of breath and abdominal distension, Hb resulted at 6.4. She has history of varices, denies any hematochezia or melena. I recommended inpt or outpt blood transfusion. She is reluctant to go inpatient. She doesn't have any chest pain or palpitations, she would rather want to do outpatient. We will also arrange for US abdomen, I sent an inbasket message to Dr Marca Ancona, We arranged for blood transfusion tomorrow. She would rather get transfusion tomorrow. She understands the need to go to the ED with any worsening symptoms.  At this time, I am not very worried about disease progression, we can always consider doing PET scan sooner if needed.   Total time spent: 30 min  *Total Encounter Time as defined by the Centers for Medicare and Medicaid Services includes, in addition to the face-to-face time of a patient visit (documented in the note above) non-face-to-face time: obtaining and reviewing outside history, ordering and reviewing medications,  tests or procedures, care coordination (communications with other health care professionals or caregivers) and documentation in the medical record.

## 2023-05-28 ENCOUNTER — Inpatient Hospital Stay: Payer: Medicare Other

## 2023-05-28 ENCOUNTER — Telehealth: Payer: Self-pay | Admitting: Adult Health

## 2023-05-28 ENCOUNTER — Telehealth: Payer: Self-pay | Admitting: *Deleted

## 2023-05-28 DIAGNOSIS — D509 Iron deficiency anemia, unspecified: Secondary | ICD-10-CM

## 2023-05-28 DIAGNOSIS — Z5111 Encounter for antineoplastic chemotherapy: Secondary | ICD-10-CM | POA: Diagnosis not present

## 2023-05-28 LAB — TYPE AND SCREEN
Antibody Screen: POSITIVE
Donor AG Type: NEGATIVE
Donor AG Type: NEGATIVE
Unit division: 0

## 2023-05-28 LAB — BPAM RBC: ISSUE DATE / TIME: 202405290719

## 2023-05-28 MED ORDER — DIPHENHYDRAMINE HCL 25 MG PO CAPS
25.0000 mg | ORAL_CAPSULE | Freq: Once | ORAL | Status: AC
  Administered 2023-05-28: 25 mg via ORAL
  Filled 2023-05-28: qty 1

## 2023-05-28 MED ORDER — SODIUM CHLORIDE 0.9% IV SOLUTION
250.0000 mL | Freq: Once | INTRAVENOUS | Status: AC
  Administered 2023-05-28: 250 mL via INTRAVENOUS

## 2023-05-28 MED ORDER — ACETAMINOPHEN 325 MG PO TABS
650.0000 mg | ORAL_TABLET | Freq: Once | ORAL | Status: AC
  Administered 2023-05-28: 650 mg via ORAL
  Filled 2023-05-28: qty 2

## 2023-05-28 NOTE — Patient Instructions (Signed)
Blood Transfusion, Adult, Care After The following information offers guidance on how to care for yourself after your procedure. Your health care provider may also give you more specific instructions. If you have problems or questions, contact your health care provider. What can I expect after the procedure? After the procedure, it is common to have: Bruising and soreness where the IV was inserted. A headache. Follow these instructions at home: IV insertion site care     Follow instructions from your health care provider about how to take care of your IV insertion site. Make sure you: Wash your hands with soap and water for at least 20 seconds before and after you change your bandage (dressing). If soap and water are not available, use hand sanitizer. Change your dressing as told by your health care provider. Check your IV insertion site every day for signs of infection. Check for: Redness, swelling, or pain. Bleeding from the site. Warmth. Pus or a bad smell. General instructions Take over-the-counter and prescription medicines only as told by your health care provider. Rest as told by your health care provider. Return to your normal activities as told by your health care provider. Keep all follow-up visits. Lab tests may need to be done at certain periods to recheck your blood counts. Contact a health care provider if: You have itching or red, swollen areas of skin (hives). You have a fever or chills. You have pain in the head, back, or chest. You feel anxious or you feel weak after doing your normal activities. You have redness, swelling, warmth, or pain around the IV insertion site. You have blood coming from the IV insertion site that does not stop with pressure. You have pus or a bad smell coming from your IV insertion site. If you received your blood transfusion in an outpatient setting, you will be told whom to contact to report any reactions. Get help right away if: You  have symptoms of a serious allergic or immune system reaction, including: Trouble breathing or shortness of breath. Swelling of the face, feeling flushed, or widespread rash. Dark urine or blood in the urine. Fast heartbeat. These symptoms may be an emergency. Get help right away. Call 911. Do not wait to see if the symptoms will go away. Do not drive yourself to the hospital. Summary Bruising and soreness around the IV insertion site are common. Check your IV insertion site every day for signs of infection. Rest as told by your health care provider. Return to your normal activities as told by your health care provider. Get help right away for symptoms of a serious allergic or immune system reaction to the blood transfusion. This information is not intended to replace advice given to you by your health care provider. Make sure you discuss any questions you have with your health care provider. Document Revised: 03/15/2022 Document Reviewed: 03/15/2022 Elsevier Patient Education  2024 [employer]  

## 2023-05-28 NOTE — Telephone Encounter (Signed)
Abd U/S scheduled for this Friday 5/31 at [facility] at 830- pt to be NPO after midnight with arrival time of 820.  This RN called pt informed her of above with verbalized understanding.

## 2023-05-28 NOTE — Telephone Encounter (Signed)
Spoke with patient husband confirming upcoming appointment  

## 2023-05-29 LAB — BPAM RBC
Blood Product Expiration Date: 202406222359
Blood Product Expiration Date: 202406292359
ISSUE DATE / TIME: 202405290719
Unit Type and Rh: 5100

## 2023-05-29 LAB — TYPE AND SCREEN
ABO/RH(D): O POS
Unit division: 0

## 2023-05-30 ENCOUNTER — Other Ambulatory Visit: Payer: Self-pay | Admitting: *Deleted

## 2023-05-30 ENCOUNTER — Ambulatory Visit (HOSPITAL_COMMUNITY)
Admission: RE | Admit: 2023-05-30 | Discharge: 2023-05-30 | Disposition: A | Discharge status: Home / Self Care | Payer: Medicare Other | Source: Ambulatory Visit | Attending: Hematology and Oncology | Admitting: Hematology and Oncology

## 2023-05-30 ENCOUNTER — Other Ambulatory Visit: Payer: Self-pay | Admitting: Hematology and Oncology

## 2023-05-30 DIAGNOSIS — Z170 Estrogen receptor positive status [ER+]: Secondary | ICD-10-CM | POA: Insufficient documentation

## 2023-05-30 DIAGNOSIS — C50411 Malignant neoplasm of upper-outer quadrant of right female breast: Secondary | ICD-10-CM | POA: Insufficient documentation

## 2023-05-30 DIAGNOSIS — R188 Other ascites: Secondary | ICD-10-CM

## 2023-05-30 MED ORDER — PALBOCICLIB 75 MG PO TABS
75.0000 mg | ORAL_TABLET | Freq: Every day | ORAL | 3 refills | Status: AC
Start: 2023-05-30 — End: ?

## 2023-06-03 ENCOUNTER — Other Ambulatory Visit: Payer: Self-pay | Admitting: Gastroenterology

## 2023-06-04 ENCOUNTER — Inpatient Hospital Stay: Payer: Medicare Other

## 2023-06-04 ENCOUNTER — Telehealth: Payer: Self-pay

## 2023-06-04 ENCOUNTER — Inpatient Hospital Stay: Payer: Medicare Other | Attending: Hematology and Oncology | Admitting: Adult Health

## 2023-06-04 ENCOUNTER — Other Ambulatory Visit: Payer: Self-pay

## 2023-06-04 ENCOUNTER — Encounter: Payer: Self-pay | Admitting: Adult Health

## 2023-06-04 VITALS — BP 130/72 | HR 107 | Temp 97.5°F | Resp 18 | Ht 64.0 in | Wt 138.4 lb

## 2023-06-04 DIAGNOSIS — Z5111 Encounter for antineoplastic chemotherapy: Secondary | ICD-10-CM | POA: Diagnosis present

## 2023-06-04 DIAGNOSIS — C50411 Malignant neoplasm of upper-outer quadrant of right female breast: Secondary | ICD-10-CM | POA: Insufficient documentation

## 2023-06-04 DIAGNOSIS — R188 Other ascites: Secondary | ICD-10-CM | POA: Diagnosis not present

## 2023-06-04 DIAGNOSIS — Z9011 Acquired absence of right breast and nipple: Secondary | ICD-10-CM | POA: Insufficient documentation

## 2023-06-04 DIAGNOSIS — Z86718 Personal history of other venous thrombosis and embolism: Secondary | ICD-10-CM | POA: Diagnosis not present

## 2023-06-04 DIAGNOSIS — C50911 Malignant neoplasm of unspecified site of right female breast: Secondary | ICD-10-CM | POA: Diagnosis not present

## 2023-06-04 DIAGNOSIS — Z170 Estrogen receptor positive status [ER+]: Secondary | ICD-10-CM | POA: Diagnosis not present

## 2023-06-04 DIAGNOSIS — M8580 Other specified disorders of bone density and structure, unspecified site: Secondary | ICD-10-CM | POA: Insufficient documentation

## 2023-06-04 DIAGNOSIS — D509 Iron deficiency anemia, unspecified: Secondary | ICD-10-CM

## 2023-06-04 DIAGNOSIS — C7800 Secondary malignant neoplasm of unspecified lung: Secondary | ICD-10-CM | POA: Insufficient documentation

## 2023-06-04 LAB — CBC WITH DIFFERENTIAL (CANCER CENTER ONLY)
Abs Immature Granulocytes: 0.03 10*3/uL (ref 0.00–0.07)
Basophils Absolute: 0.1 10*3/uL (ref 0.0–0.1)
Basophils Relative: 2 %
Eosinophils Absolute: 0.1 10*3/uL (ref 0.0–0.5)
Eosinophils Relative: 4 %
HCT: 29.6 % — ABNORMAL LOW (ref 36.0–46.0)
Hemoglobin: 9.1 g/dL — ABNORMAL LOW (ref 12.0–15.0)
Immature Granulocytes: 1 %
Lymphocytes Relative: 13 %
Lymphs Abs: 0.4 10*3/uL — ABNORMAL LOW (ref 0.7–4.0)
MCH: 28.1 pg (ref 26.0–34.0)
MCHC: 30.7 g/dL (ref 30.0–36.0)
MCV: 91.4 fL (ref 80.0–100.0)
Monocytes Absolute: 0.5 10*3/uL (ref 0.1–1.0)
Monocytes Relative: 17 %
Neutro Abs: 1.9 10*3/uL (ref 1.7–7.7)
Neutrophils Relative %: 63 %
Platelet Count: 116 10*3/uL — ABNORMAL LOW (ref 150–400)
RBC: 3.24 MIL/uL — ABNORMAL LOW (ref 3.87–5.11)
RDW: 19.9 % — ABNORMAL HIGH (ref 11.5–15.5)
WBC Count: 2.9 10*3/uL — ABNORMAL LOW (ref 4.0–10.5)
nRBC: 0 % (ref 0.0–0.2)

## 2023-06-04 LAB — COMPREHENSIVE METABOLIC PANEL
ALT: 10 U/L (ref 0–44)
AST: 16 U/L (ref 15–41)
Albumin: 3.9 g/dL (ref 3.5–5.0)
Alkaline Phosphatase: 70 U/L (ref 38–126)
Anion gap: 8 (ref 5–15)
BUN: 13 mg/dL (ref 8–23)
CO2: 25 mmol/L (ref 22–32)
Calcium: 8.7 mg/dL — ABNORMAL LOW (ref 8.9–10.3)
Chloride: 107 mmol/L (ref 98–111)
Creatinine, Ser: 0.93 mg/dL (ref 0.44–1.00)
GFR, Estimated: >60 mL/min (ref >60)
Glucose, Bld: 112 mg/dL — ABNORMAL HIGH (ref 70–99)
Potassium: 3.6 mmol/L (ref 3.5–5.1)
Sodium: 140 mmol/L (ref 135–145)
Total Bilirubin: 0.7 mg/dL (ref 0.3–1.2)
Total Protein: 6.7 g/dL (ref 6.5–8.1)

## 2023-06-04 LAB — TYPE AND SCREEN
ABO/RH(D): O POS
Antibody Screen: POSITIVE

## 2023-06-04 NOTE — Telephone Encounter (Signed)
Called and given below message. Lab appt scheduled for 06/16/23 at 1115. NP states she does not need to see Pt on same day. Pt verbalized understanding.

## 2023-06-04 NOTE — Assessment & Plan Note (Addendum)
Nicole [name] is a 67 year old woman with metastatic breast cancer here today for labs, follow-up, and her injection and she is on treatment with fulvestrant/Faslodex and palbociclib/Ibrance.  Metastatic breast cancer: She has no clinical or radiographic signs of breast cancer progression.  She is tolerating her current treatment and will continue on it.  Her next restaging scans are due around September. Abdominal ascites: She will undergo ultrasound-guided paracentesis on Friday.  Is following up with Dr. Pati Gallo about the bleeding varices. Symptomatic anemia: We will recheck a CBC today.  Depending on that level we will decide when she needs to return for labs and if that is necessary before her June 25 follow-up appointment. Osteoporosis: she will continue on Prolia every 6 months.  She is tolerating this well.  [name] will return on June 25 for labs, follow-up, and her next injection.  We will reach out to her based on her blood results today to determine if she needs to come back sooner for lab only to check her hemoglobin.

## 2023-06-04 NOTE — Progress Notes (Signed)
[facility] Cancer Follow up:    Nicole Moulds, MD [address] [place] [address]   DIAGNOSIS:  Cancer Staging  Breast cancer metastasized to lung [facility]) Staging form: Breast, AJCC 7th Edition - Clinical: Stage IV (TX, NX, M1) - Signed by Lowella Dell, MD on 11/01/2015  Malignant neoplasm of upper-outer quadrant of right breast in female, estrogen receptor positive (HCC) Staging form: Breast, AJCC 7th Edition - Clinical: Stage IV (TX, NX, M1) - Signed by Lowella Dell, MD on 11/01/2015   SUMMARY OF ONCOLOGIC HISTORY:  67 y.o. [place] woman presenting June 2011 with stage IV breast cancer involving the right breast upper outer quadrant, right axilla, mediastinal lymph nodes, and both lungs, but not the brain, liver, or bones   (1) positive right breast and right axillary lymph node biopsies 07/17/2010 of a clinical T4 N2 M1 invasive ductal carcinoma, grade 1, estrogen receptor 100% positive, progesterone receptor 6% positive, with an MIB-1 of 12%, and no HER-2 amplification.   (2) participated in Phase II tessetaxel study, receiving 2 cycles complicated by thrombocytopenia, anemia requiring transfusion, transaminase elevation, and afebrile neutropenia. Off-study as of September 2011. Chest CT scan September 2011 did show evidence of response.   (3) on letrozole as of October 2011, with continuing response and good tolerance    (4) 06/02/2012 underwent right mastectomy and axillary lymph node sampling (3 lymph nodes removed, all with viable cancer as well as evidence of treatment effect) for a ypT2 ypN1-2 invasive ductal carcinoma, grade 1, 98% estrogen receptor positive, progesterone receptor negative, with no HER-2 amplification   (5) left jugular vein DVT documented 06/16/2012; left sided Port-A-Cath removed mid July 2015; Coumadin stopped 09/07/2014.             (a) Left brachiocephalic v. slightly narrowed, with some Left lower neck collateralization  noted on chest CT December 2015   (6) osteoporosis, was on alendronate, switched to denosumab/Prolia 11/07/2016             (a) bone density in 10/2018 shows osteopenia T score of -2.2   (7) portal hypertension, with splenomegaly, gastric varices, and intestinal edema             (a)  mild persistent leukopenia and thrombocytopenia likely due to splenomegaly             (b) iron deficiency secondary to above, status post Feraheme x2 December 2018   (8) restaging studies:             (a) chest CT 12/26/2017 shows a 4.5 mm left lower lobe pulmonary nodule which is unchanged.  There was no other evidence of active disease             (b) restaging chest CT in 05/2019 shows no change in bilateral pulmonary nodules, prominent mediastinal lymph nodes are stable   (9) CT scan of the abdomen 11/03/2021 shows thrombus in the portal, splenic and superior mesenteric vein             (A) Heparin started 11/03/2021--has since been discontinued   10. PET scan 05/24/2022, low metabolic activity of right lower pulmonary nodule. No change in size of CT 02/27/2022, Carcinoma with mucinous features consistent with metastatic breast carcinoma, negative margins, no LN involvement.  Prognostics showed ER +100% strong staining, PR +20% staining, Ki-67 of 10% and HER2 2+ by IHC and negative by FISH  CURRENT THERAPY: Faslodex and Ibrance; Prolia  INTERVAL HISTORY: Nicole [name] 67  y.o. female returns for f/u of her metastatic breast cancer.  He continues on Faslodex every 4 weeks and Ibrance 75 mg 3 weeks on and 1 week off.  She also receives Prolia every 6 months. She is tolerating these treatments well and denies any side effects or issues with this.    Her most recent restaging PET scan occurred on 03/17/2023.  This demonstrated persistent asymmetric hypermetabolic activity at the right hilum, similar to previous PET/CT and nonspecific continued attention on follow-up is recommended.  There is no other evidence of  metastatic disease or local recurrence.  She had small bilateral pleural effusions improved in the interval on the right.  There were stable morphologic changes of cirrhosis and ascites and aortic atherosclerosis was also noted.  She undergoes restaging about every 6 months.  Nicole [name] has a history of non-alcoholic steatohepatitis.  This has caused varices and ascites.  She has been struggling with decreased hemoglobin and was transfused last week with 2 units of blood for hemoglobin of 6.4.    Due to her increased abdominal distention she underwent an ultrasound of the abdomen on May 30, 2023 that demonstrated ascites in the right lower quadrant, left upper quadrant, and left lower quadrant.  The ascites amount was noted as moderate and drainable.  She is scheduled for Friday, 06/06/2023 to undergo paracentesis.  Dr. Marca Ancona is also going to evaluate her for bleeding varices due to her anemia.    Patient Active Problem List   Diagnosis Date Noted   Lung nodule 07/01/2022   S/P partial lobectomy of lung 07/01/2022   Portal vein thrombosis 02/15/2022   Hypercoagulable state (HCC) 02/15/2022   Acute occlusion of mesenteric vein (HCC) 02/15/2022   Goals of care, counseling/discussion 11/17/2017   Hematemesis 09/04/2017   Melena 09/04/2017   Thrombocytopenia (HCC) 09/04/2017   GI bleed 09/04/2017   Idiopathic cirrhosis (HCC)    Portal hypertension (HCC)    Iron deficiency anemia 05/19/2017   Osteoporosis 05/09/2016   Depression 04/30/2016   UTI (urinary tract infection) 04/30/2016   Acute upper GI bleed 04/11/2016   Esophageal varices (HCC) 04/11/2016   Breast cancer metastasized to lung (HCC) 03/14/2014   Malignant neoplasm of upper-outer quadrant of right breast in female, estrogen receptor positive (HCC) 09/30/2013   Lymphedema of arm 06/14/2013   DVT (deep venous thrombosis) (HCC) 12/15/2012    has no active allergies.  MEDICAL HISTORY: Past Medical History:  Diagnosis Date   Anemia     low iron   Arthritis    Blood transfusion 2012   Breast CA (HCC)    breast ca dx 06/2010, stage 4, right , Lung Metasis.Surgery, Chemo, Radiation   Breast cancer (HCC)    Clot    jugular  ?2015   Depression    Esophageal varices (HCC)    Headache    Mirgraine- rare since menopause   Heart murmur    mild, no cardiologist, from birth   Hematemesis 08/2017   History of kidney stones    no pain- shows in CT- small 1 mm bilaterally- kidney   Hypertension    IBD (inflammatory bowel disease)    resolved   Idiopathic cirrhosis (HCC)    pt denies   Peripheral vascular disease (HCC) few yrs ago   clot in right jugular   Portal hypertension (HCC)    Radiation 07/21/12-09/07/12   5940 cGy   Restless legs     SURGICAL HISTORY: Past Surgical History:  Procedure Laterality Date   BIOPSY  08/21/2020   Procedure: BIOPSY;  Surgeon: Kerin Salen, MD;  Location: [facility] ENDOSCOPY;  Service: Gastroenterology;;   COLONOSCOPY  2016   DILATION AND CURETTAGE OF UTERUS  2004?   ESOPHAGEAL BANDING  09/04/2017   Procedure: ESOPHAGEAL BANDING;  Surgeon: Kerin Salen, MD;  Location: [facility] ENDOSCOPY;  Service: Gastroenterology;;   ESOPHAGEAL BANDING  11/27/2017   Procedure: ESOPHAGEAL BANDING;  Surgeon: Kerin Salen, MD;  Location: [facility] ENDOSCOPY;  Service: Gastroenterology;;   ESOPHAGEAL BANDING N/A 04/28/2018   Procedure: ESOPHAGEAL BANDING;  Surgeon: Kerin Salen, MD;  Location: [facility] ENDOSCOPY;  Service: Gastroenterology;  Laterality: N/A;   ESOPHAGEAL BANDING  11/17/2018   Procedure: ESOPHAGEAL BANDING;  Surgeon: Kerin Salen, MD;  Location: [facility] ENDOSCOPY;  Service: Gastroenterology;;   ESOPHAGEAL BANDING  08/16/2019   Procedure: ESOPHAGEAL BANDING;  Surgeon: Kerin Salen, MD;  Location: [facility] ENDOSCOPY;  Service: Gastroenterology;;   ESOPHAGEAL BANDING N/A 08/21/2020   Procedure: ESOPHAGEAL BANDING;  Surgeon: Kerin Salen, MD;  Location: [facility] ENDOSCOPY;  Service: Gastroenterology;  Laterality: N/A;    ESOPHAGOGASTRODUODENOSCOPY N/A 04/28/2018   Procedure: ESOPHAGOGASTRODUODENOSCOPY (EGD);  Surgeon: Kerin Salen, MD;  Location: [facility] ENDOSCOPY;  Service: Gastroenterology;  Laterality: N/A;   ESOPHAGOGASTRODUODENOSCOPY N/A 11/17/2018   Procedure: ESOPHAGOGASTRODUODENOSCOPY (EGD);  Surgeon: Kerin Salen, MD;  Location: [facility] ENDOSCOPY;  Service: Gastroenterology;  Laterality: N/A;   ESOPHAGOGASTRODUODENOSCOPY N/A 08/13/2022   Procedure: ESOPHAGOGASTRODUODENOSCOPY (EGD);  Surgeon: Kerin Salen, MD;  Location: [facility] ENDOSCOPY;  Service: Gastroenterology;  Laterality: N/A;   ESOPHAGOGASTRODUODENOSCOPY (EGD) WITH PROPOFOL N/A 04/11/2016   Procedure: ESOPHAGOGASTRODUODENOSCOPY (EGD) WITH PROPOFOL;  Surgeon: Vida Rigger, MD;  Location: [facility] ENDOSCOPY;  Service: Endoscopy;  Laterality: N/A;   ESOPHAGOGASTRODUODENOSCOPY (EGD) WITH PROPOFOL N/A 07/08/2016   Procedure: ESOPHAGOGASTRODUODENOSCOPY (EGD) WITH PROPOFOL;  Surgeon: Carman Ching, MD;  Location: [facility] ENDOSCOPY;  Service: Endoscopy;  Laterality: N/A;   ESOPHAGOGASTRODUODENOSCOPY (EGD) WITH PROPOFOL N/A 03/03/2017   Procedure: ESOPHAGOGASTRODUODENOSCOPY (EGD) WITH PROPOFOL;  Surgeon: Carman Ching, MD;  Location: [facility] ENDOSCOPY;  Service: Endoscopy;  Laterality: N/A;   ESOPHAGOGASTRODUODENOSCOPY (EGD) WITH PROPOFOL N/A 09/04/2017   Procedure: ESOPHAGOGASTRODUODENOSCOPY (EGD) WITH PROPOFOL;  Surgeon: Kerin Salen, MD;  Location: [facility] ENDOSCOPY;  Service: Gastroenterology;  Laterality: N/A;   ESOPHAGOGASTRODUODENOSCOPY (EGD) WITH PROPOFOL N/A 11/27/2017   Procedure: ESOPHAGOGASTRODUODENOSCOPY (EGD);  Surgeon: Kerin Salen, MD;  Location: [facility] ENDOSCOPY;  Service: Gastroenterology;  Laterality: N/A;   ESOPHAGOGASTRODUODENOSCOPY (EGD) WITH PROPOFOL N/A 08/16/2019   Procedure: ESOPHAGOGASTRODUODENOSCOPY (EGD) WITH PROPOFOL;  Surgeon: Kerin Salen, MD;  Location: [facility] ENDOSCOPY;  Service: Gastroenterology;  Laterality: N/A;   ESOPHAGOGASTRODUODENOSCOPY (EGD) WITH PROPOFOL N/A 08/21/2020    Procedure: ESOPHAGOGASTRODUODENOSCOPY (EGD) WITH PROPOFOL;  Surgeon: Kerin Salen, MD;  Location: [facility] ENDOSCOPY;  Service: Gastroenterology;  Laterality: N/A;   ESOPHAGOGASTRODUODENOSCOPY (EGD) WITH PROPOFOL N/A 08/24/2021   Procedure: ESOPHAGOGASTRODUODENOSCOPY (EGD) WITH PROPOFOL;  Surgeon: Kerin Salen, MD;  Location: [facility] ENDOSCOPY;  Service: Gastroenterology;  Laterality: N/A;   GASTRIC VARICES BANDING N/A 08/24/2021   Procedure: GASTRIC VARICES BANDING;  Surgeon: Kerin Salen, MD;  Location: [facility] ENDOSCOPY;  Service: Gastroenterology;  Laterality: N/A;   GASTRIC VARICES BANDING N/A 08/13/2022   Procedure: GASTRIC VARICES BANDING;  Surgeon: Kerin Salen, MD;  Location: [facility] ENDOSCOPY;  Service: Gastroenterology;  Laterality: N/A;   HEMOSTASIS CLIP PLACEMENT  08/21/2020   Procedure: HEMOSTASIS CLIP PLACEMENT;  Surgeon: Kerin Salen, MD;  Location: [facility] ENDOSCOPY;  Service: Gastroenterology;;   INTERCOSTAL NERVE BLOCK Left 07/01/2022   Procedure: INTERCOSTAL NERVE BLOCK;  Surgeon: Loreli Slot, MD;  Location: [facility] OR;  Service: Thoracic;  Laterality: Left;  IR GENERIC HISTORICAL  05/15/2016   IR RADIOLOGIST EVAL & MGMT 05/15/2016 Gilmer Mor, DO [facility] INTERV RAD   IR GENERIC HISTORICAL  01/14/2017   IR US GUIDE VASC ACCESS RIGHT 01/14/2017 Berdine Dance, MD [facility]   IR GENERIC HISTORICAL  01/14/2017   IR VENOGRAM HEPATIC W HEMODYNAMIC EVALUATION 01/14/2017 Berdine Dance, MD [facility]   IR RADIOLOGIST EVAL & MGMT  06/12/2017   IR RADIOLOGIST EVAL & MGMT  08/11/2018   IR RADIOLOGIST EVAL & MGMT  08/25/2019   IR RADIOLOGIST EVAL & MGMT  08/22/2020   IR RADIOLOGIST EVAL & MGMT  03/11/2022   MASTECTOMY Right 06/02/12   right breast with lymph node removal   NODE DISSECTION Left 07/01/2022   Procedure: NODE DISSECTION;  Surgeon: Loreli Slot, MD;  Location: [facility] OR;  Service: Thoracic;  Laterality: Left;   PORT-A-CATH REMOVAL     PORTACATH PLACEMENT  2011   left side   RADIOLOGY WITH ANESTHESIA  N/A 04/13/2016   Procedure: RADIOLOGY WITH ANESTHESIA;  Surgeon: Gilmer Mor, DO;  Location: [facility] OR;  Service: Anesthesiology;  Laterality: N/A;   VIDEO BRONCHOSCOPY WITH ENDOBRONCHIAL NAVIGATION N/A 07/01/2022   Procedure: VIDEO BRONCHOSCOPY WITH ENDOBRONCHIAL NAVIGATION;  Surgeon: Loreli Slot, MD;  Location: [facility] OR;  Service: Thoracic;  Laterality: N/A;   XI ROBOTIC ASSISTED THORACOSCOPY- SEGMENTECTOMY Left 07/01/2022   Procedure: XI ROBOTIC ASSISTED THORACOSCOPY-LEFT LOWER LOBE BASILAR SEGMENTECTOMY;  Surgeon: Loreli Slot, MD;  Location: [facility] OR;  Service: Thoracic;  Laterality: Left;    SOCIAL HISTORY: Social History   Socioeconomic History   Marital status: Married    Spouse name: Not on file   Number of children: Not on file   Years of education: Not on file   Highest education level: Not on file  Occupational History   Not on file  Tobacco Use   Smoking status: Never   Smokeless tobacco: Never  Vaping Use   Vaping Use: Never used  Substance and Sexual Activity   Alcohol use: No   Drug use: No   Sexual activity: Yes  Other Topics Concern   Not on file  Social History Narrative   Not on file   Social Determinants of Health   Financial Resource Strain: Not on file  Food Insecurity: Not on file  Transportation Needs: Not on file  Physical Activity: Not on file  Stress: Not on file  Social Connections: Not on file  Intimate Partner Violence: Not on file    FAMILY HISTORY: Family History  Problem Relation Age of Onset   COPD Mother    AAA (abdominal aortic aneurysm) Mother    COPD Father    Cancer Neg Hx     Review of Systems  Constitutional:  Positive for fatigue. Negative for appetite change, chills, fever and unexpected weight change.  HENT:   Negative for hearing loss, lump/mass and trouble swallowing.   Eyes:  Negative for eye problems and icterus.  Respiratory:  Negative for chest tightness, cough and shortness of breath.   Cardiovascular:   Negative for chest pain, leg swelling and palpitations.  Gastrointestinal:  Negative for abdominal distention, abdominal pain, constipation, diarrhea, nausea and vomiting.  Endocrine: Negative for hot flashes.  Genitourinary:  Negative for difficulty urinating.   Musculoskeletal:  Negative for arthralgias.  Skin:  Negative for itching and rash.  Neurological:  Negative for dizziness, extremity weakness, headaches, light-headedness and numbness.  Hematological:  Negative for adenopathy. Does not bruise/bleed easily.  Psychiatric/Behavioral:  Negative  for depression. The patient is not nervous/anxious.       PHYSICAL EXAMINATION   Onc Performance Status - 06/04/23 0843       ECOG Perf Status   ECOG Perf Status Restricted in physically strenuous activity but ambulatory and able to carry out work of a light or sedentary nature, e.g., light house work, office work      KPS SCALE   KPS % SCORE Normal activity with effort, some s/s of disease             Vitals:   06/04/23 0838  BP: 130/72  Pulse: (!) 107  Resp: 18  Temp: (!) 97.5 F (36.4 C)  SpO2: 100%    Physical Exam Constitutional:      General: She is not in acute distress.    Appearance: Normal appearance. She is not toxic-appearing.  HENT:     Head: Normocephalic and atraumatic.     Mouth/Throat:     Mouth: Mucous membranes are moist.     Pharynx: Oropharynx is clear. No oropharyngeal exudate or posterior oropharyngeal erythema.  Eyes:     General: No scleral icterus. Cardiovascular:     Rate and Rhythm: Normal rate and regular rhythm.     Pulses: Normal pulses.     Heart sounds: Normal heart sounds.  Pulmonary:     Effort: Pulmonary effort is normal.     Breath sounds: Normal breath sounds.  Abdominal:     General: Abdomen is flat. Bowel sounds are normal. There is distension (Ascites noted).     Palpations: Abdomen is soft.     Tenderness: There is no abdominal tenderness.  Musculoskeletal:         General: No swelling.     Cervical back: Neck supple.  Lymphadenopathy:     Cervical: No cervical adenopathy.  Skin:    General: Skin is warm and dry.     Findings: No rash.  Neurological:     General: No focal deficit present.     Mental Status: She is alert.  Psychiatric:        Mood and Affect: Mood normal.        Behavior: Behavior normal.     LABORATORY DATA:  CBC    Component Value Date/Time   WBC 2.8 (L) 05/27/2023 1420   RBC 2.14 (L) 05/27/2023 1420   HGB 6.4 (LL) 05/27/2023 1420   HGB 11.1 (L) 10/09/2022 1300   HGB 12.6 11/17/2017 1355   HCT 21.1 (L) 05/27/2023 1420   HCT 39.6 11/17/2017 1355   PLT 99 (L) 05/27/2023 1420   PLT 188 10/09/2022 1300   PLT 121 (L) 11/17/2017 1355   MCV 98.6 05/27/2023 1420   MCV 79.1 (L) 11/17/2017 1355   MCH 29.9 05/27/2023 1420   MCHC 30.3 05/27/2023 1420   RDW 21.6 (H) 05/27/2023 1420   RDW 18.6 (H) 11/17/2017 1355   LYMPHSABS 0.4 (L) 05/27/2023 1420   LYMPHSABS 1.0 11/17/2017 1355   MONOABS 0.3 05/27/2023 1420   MONOABS 0.4 11/17/2017 1355   EOSABS 0.0 05/27/2023 1420   EOSABS 0.0 11/17/2017 1355   BASOSABS 0.0 05/27/2023 1420   BASOSABS 0.0 11/17/2017 1355    CMP     Component Value Date/Time   NA 137 05/27/2023 1420   NA 142 11/17/2017 1355   K 3.8 05/27/2023 1420   K 4.4 11/17/2017 1355   CL 107 05/27/2023 1420   CL 109 (H) 04/20/2013 0859   CO2 20 (L) 05/27/2023 1420  CO2 22 11/17/2017 1355   GLUCOSE 109 (H) 05/27/2023 1420   GLUCOSE 86 11/17/2017 1355   GLUCOSE 91 04/20/2013 0859   BUN 18 05/27/2023 1420   BUN 12.5 11/17/2017 1355   CREATININE 0.99 05/27/2023 1420   CREATININE 1.07 (H) 09/23/2022 1327   CREATININE 1.1 11/17/2017 1355   CALCIUM 8.3 (L) 05/27/2023 1420   CALCIUM 9.2 11/17/2017 1355   PROT 6.4 (L) 05/27/2023 1420   PROT 7.0 11/17/2017 1355   ALBUMIN 3.8 05/27/2023 1420   ALBUMIN 3.8 11/17/2017 1355   AST 14 (L) 05/27/2023 1420   AST 14 (L) 09/23/2022 1327   AST 23 11/17/2017 1355    ALT 9 05/27/2023 1420   ALT 11 09/23/2022 1327   ALT 22 11/17/2017 1355   ALKPHOS 66 05/27/2023 1420   ALKPHOS 55 11/17/2017 1355   BILITOT 0.7 05/27/2023 1420   BILITOT 0.7 09/23/2022 1327   BILITOT 0.49 11/17/2017 1355   GFRNONAA >60 05/27/2023 1420   GFRNONAA 57 (L) 09/23/2022 1327   GFRNONAA 53 (L) 05/09/2016 1104   GFRAA 53 (L) 06/12/2020 1146   GFRAA 55 (L) 12/13/2019 1200   GFRAA 61 05/09/2016 1104        ASSESSMENT and THERAPY PLAN:   Breast cancer metastasized to lung [facility]) Asjha is a 67 year old woman with metastatic breast cancer here today for labs, follow-up, and her injection and she is on treatment with fulvestrant/Faslodex and palbociclib/Ibrance.  Metastatic breast cancer: She has no clinical or radiographic signs of breast cancer progression.  She is tolerating her current treatment and will continue on it.  Her next restaging scans are due around September. Abdominal ascites: She will undergo ultrasound-guided paracentesis on Friday.  Is following up with Dr. Pati Gallo about the bleeding varices. Symptomatic anemia: We will recheck a CBC today.  Depending on that level we will decide when she needs to return for labs and if that is necessary before her June 25 follow-up appointment. Osteoporosis: she will continue on Prolia every 6 months.  She is tolerating this well.  Brionca will return on June 25 for labs, follow-up, and her next injection.  We will reach out to her based on her blood results today to determine if she needs to come back sooner for lab only to check her hemoglobin.   All questions were answered. The patient knows to call the clinic with any problems, questions or concerns. We can certainly see the patient much sooner if necessary.  Total encounter time:20 minutes*in face-to-face visit time, chart review, lab review, care coordination, order entry, and documentation of the encounter time.    Lillard Anes, NP 06/04/23 9:06 AM Medical  Oncology and Hematology [facility] [address] [place], [place] [address] Tel. [phone]    Fax. [phone]  *Total Encounter Time as defined by the Centers for Medicare and Medicaid Services includes, in addition to the face-to-face time of a patient visit (documented in the note above) non-face-to-face time: obtaining and reviewing outside history, ordering and reviewing medications, tests or procedures, care coordination (communications with other health care professionals or caregivers) and documentation in the medical record.

## 2023-06-04 NOTE — Telephone Encounter (Signed)
-----   Message from Loa Socks, NP sent at 06/04/2023 10:36 AM EDT ----- Hemoglobin has improved.  I want to have her return for repeat labs in 10-14 days, whatever works with her schedule if that's ok?  ----- Message ----- From: Leory Plowman, Lab In Dresser Sent: 06/04/2023   9:16 AM EDT To: Loa Socks, NP

## 2023-06-06 ENCOUNTER — Telehealth: Payer: Self-pay | Admitting: *Deleted

## 2023-06-06 ENCOUNTER — Other Ambulatory Visit: Payer: Self-pay | Admitting: *Deleted

## 2023-06-06 ENCOUNTER — Ambulatory Visit (HOSPITAL_COMMUNITY)
Admission: RE | Admit: 2023-06-06 | Discharge: 2023-06-06 | Disposition: A | Discharge status: Home / Self Care | Payer: Medicare Other | Source: Ambulatory Visit | Attending: Hematology and Oncology | Admitting: Hematology and Oncology

## 2023-06-06 DIAGNOSIS — R188 Other ascites: Secondary | ICD-10-CM | POA: Diagnosis present

## 2023-06-06 DIAGNOSIS — D500 Iron deficiency anemia secondary to blood loss (chronic): Secondary | ICD-10-CM

## 2023-06-06 LAB — BODY FLUID CELL COUNT WITH DIFFERENTIAL
Lymphs, Fluid: 41 %
Monocyte-Macrophage-Serous Fluid: 59 % (ref 50–90)
Total Nucleated Cell Count, Fluid: 278 cu mm (ref 0–1000)

## 2023-06-06 LAB — ALBUMIN, PLEURAL OR PERITONEAL FLUID: Albumin, Fluid: <1.5 g/dL

## 2023-06-06 LAB — BODY FLUID CULTURE W GRAM STAIN

## 2023-06-06 MED ORDER — LIDOCAINE HCL 1 % IJ SOLN
INTRAMUSCULAR | Status: AC
  Filled 2023-06-06: qty 20

## 2023-06-06 NOTE — Telephone Encounter (Signed)
This RN spoke with pt's husband [name] - per his VM left later this afternoon stating she has not received her Ilda Foil and she was to start yesterday.  This RN informed her post receiving above message this RN attempted to contact the pharmacy per note on refill showing received on 05/30/2023. This RN was on hold for 55 minutes and had to d/c the call.  Informed him this note will be forwarded to our oral chemo team for follow up on Monday.  If medication arrives over the weekend she may start.  [name] also stated Lajuan had her paracentesis done yesterday and " is very weak again - not sure if she needs more blood - could you ask Dr Al Pimple on Monday"  This RN discussed above with plan for lab at 1pm on Monday and they are to wait for review by nurse/MD for further needs.  This RN scheduled appt and labs.

## 2023-06-06 NOTE — Procedures (Signed)
Ultrasound-guided diagnostic and therapeutic paracentesis performed yielding 1.6 liters of hazy, yellow  fluid. No immediate complications. The fluid was sent to the lab for preordered studies. EBL none.

## 2023-06-07 LAB — BODY FLUID CULTURE W GRAM STAIN: Culture: NO GROWTH

## 2023-06-09 ENCOUNTER — Inpatient Hospital Stay: Payer: Medicare Other

## 2023-06-09 LAB — CYTOLOGY - NON PAP

## 2023-06-11 ENCOUNTER — Telehealth: Payer: Self-pay | Admitting: *Deleted

## 2023-06-11 NOTE — Telephone Encounter (Signed)
Dr. Al Pimple has completed form from [facility] GI/Dr. Marca Ancona stating patient can stop Eliquis 3 days prior to endoscopy.  Contacted patient at Dr. Remonia Richter request to discuss risk of DVT while off Eliquis/anticoagulation for 3 days prior to endoscopy (scheduled 07/08/23). Spoke with patient's spouse and informed him that there is increased risk for DVT anytime patient is not taking Eliquis. He verbalized understanding of this information. He stated she had a break in treatment last year when she had lung surgery and did not experience any complications.

## 2023-06-12 ENCOUNTER — Encounter: Payer: Self-pay | Admitting: Hematology and Oncology

## 2023-06-16 ENCOUNTER — Encounter: Payer: Self-pay | Admitting: Hematology and Oncology

## 2023-06-16 ENCOUNTER — Other Ambulatory Visit: Payer: Self-pay | Admitting: *Deleted

## 2023-06-16 ENCOUNTER — Other Ambulatory Visit: Payer: Self-pay

## 2023-06-16 ENCOUNTER — Telehealth: Payer: Self-pay

## 2023-06-16 ENCOUNTER — Inpatient Hospital Stay: Payer: Medicare Other

## 2023-06-16 DIAGNOSIS — D500 Iron deficiency anemia secondary to blood loss (chronic): Secondary | ICD-10-CM

## 2023-06-16 DIAGNOSIS — Z5111 Encounter for antineoplastic chemotherapy: Secondary | ICD-10-CM | POA: Diagnosis not present

## 2023-06-16 DIAGNOSIS — I8511 Secondary esophageal varices with bleeding: Secondary | ICD-10-CM

## 2023-06-16 DIAGNOSIS — K922 Gastrointestinal hemorrhage, unspecified: Secondary | ICD-10-CM

## 2023-06-16 LAB — CBC WITH DIFFERENTIAL (CANCER CENTER ONLY)
Abs Immature Granulocytes: 0.08 10*3/uL — ABNORMAL HIGH (ref 0.00–0.07)
Basophils Absolute: 0 10*3/uL (ref 0.0–0.1)
Basophils Relative: 0 %
Eosinophils Absolute: 0.2 10*3/uL (ref 0.0–0.5)
Eosinophils Relative: 2 %
HCT: 21.1 % — ABNORMAL LOW (ref 36.0–46.0)
Hemoglobin: 6.3 g/dL — CL (ref 12.0–15.0)
Immature Granulocytes: 1 %
Lymphocytes Relative: 8 %
Lymphs Abs: 0.6 10*3/uL — ABNORMAL LOW (ref 0.7–4.0)
MCH: 30.9 pg (ref 26.0–34.0)
MCHC: 29.9 g/dL — ABNORMAL LOW (ref 30.0–36.0)
MCV: 103.4 fL — ABNORMAL HIGH (ref 80.0–100.0)
Monocytes Absolute: 0.4 10*3/uL (ref 0.1–1.0)
Monocytes Relative: 5 %
Neutro Abs: 5.7 10*3/uL (ref 1.7–7.7)
Neutrophils Relative %: 84 %
Platelet Count: 294 10*3/uL (ref 150–400)
RBC: 2.04 MIL/uL — ABNORMAL LOW (ref 3.87–5.11)
RDW: 22.7 % — ABNORMAL HIGH (ref 11.5–15.5)
WBC Count: 6.9 10*3/uL (ref 4.0–10.5)
nRBC: 0.3 % — ABNORMAL HIGH (ref 0.0–0.2)

## 2023-06-16 LAB — TYPE AND SCREEN

## 2023-06-16 LAB — BPAM RBC: Unit Type and Rh: 5100

## 2023-06-16 LAB — SAMPLE TO BLOOD BANK

## 2023-06-16 NOTE — Telephone Encounter (Signed)
This RN spoke with pt per noted heme of 6.3 per lab draw today.   Nicole [name] states severe fatigue and SHOB with exertion.  She states her endoscopic procedure is not scheduled until July 9 for known history of esophageal varices.  This RN scheduled pt for blood transfusion of 2 units to be given 06/17/2023 and informed pt.  This RN also called to Dr Laurey Morale at [facility] to inquire of possible ability to move endoscopic procedure up due to continued drop in heme requiring multiple blood transfusion. Message left per above with this RN 's direct phone number given for return call.  Of note for procedure noted that pt needs to stop her Eliquis 3 days prior to procedure with no need for lovenox bridging.

## 2023-06-16 NOTE — Telephone Encounter (Signed)
CRITICAL VALUE STICKER  CRITICAL VALUE: Hgb. 6.3  RECEIVER (on-site recipient of call): Sharlette Dense CMA  DATE & TIME NOTIFIED: 06/16/2023 1139  MESSENGER (representative from lab): Byrd Hesselbach in Lab  MD NOTIFIED: Dr. Al Pimple  TIME OF NOTIFICATION: 0139  RESPONSE: Made physician and nurse aware

## 2023-06-17 ENCOUNTER — Inpatient Hospital Stay: Payer: Medicare Other

## 2023-06-17 DIAGNOSIS — Z5111 Encounter for antineoplastic chemotherapy: Secondary | ICD-10-CM | POA: Diagnosis not present

## 2023-06-17 DIAGNOSIS — K922 Gastrointestinal hemorrhage, unspecified: Secondary | ICD-10-CM

## 2023-06-17 DIAGNOSIS — I8511 Secondary esophageal varices with bleeding: Secondary | ICD-10-CM

## 2023-06-17 DIAGNOSIS — D500 Iron deficiency anemia secondary to blood loss (chronic): Secondary | ICD-10-CM

## 2023-06-17 LAB — BPAM RBC: Unit Type and Rh: 5100

## 2023-06-17 LAB — TYPE AND SCREEN: Antibody Screen: POSITIVE

## 2023-06-17 MED ORDER — ACETAMINOPHEN 325 MG PO TABS
650.0000 mg | ORAL_TABLET | Freq: Once | ORAL | Status: AC
  Administered 2023-06-17: 650 mg via ORAL
  Filled 2023-06-17: qty 2

## 2023-06-17 MED ORDER — SODIUM CHLORIDE 0.9% IV SOLUTION
250.0000 mL | Freq: Once | INTRAVENOUS | Status: AC
  Administered 2023-06-17: 250 mL via INTRAVENOUS

## 2023-06-17 NOTE — Patient Instructions (Signed)
Blood Transfusion, Adult, Care After The following information offers guidance on how to care for yourself after your procedure. Your health care provider may also give you more specific instructions. If you have problems or questions, contact your health care provider. What can I expect after the procedure? After the procedure, it is common to have: Bruising and soreness where the IV was inserted. A headache. Follow these instructions at home: IV insertion site care     Follow instructions from your health care provider about how to take care of your IV insertion site. Make sure you: Wash your hands with soap and water for at least 20 seconds before and after you change your bandage (dressing). If soap and water are not available, use hand sanitizer. Change your dressing as told by your health care provider. Check your IV insertion site every day for signs of infection. Check for: Redness, swelling, or pain. Bleeding from the site. Warmth. Pus or a bad smell. General instructions Take over-the-counter and prescription medicines only as told by your health care provider. Rest as told by your health care provider. Return to your normal activities as told by your health care provider. Keep all follow-up visits. Lab tests may need to be done at certain periods to recheck your blood counts. Contact a health care provider if: You have itching or red, swollen areas of skin (hives). You have a fever or chills. You have pain in the head, back, or chest. You feel anxious or you feel weak after doing your normal activities. You have redness, swelling, warmth, or pain around the IV insertion site. You have blood coming from the IV insertion site that does not stop with pressure. You have pus or a bad smell coming from your IV insertion site. If you received your blood transfusion in an outpatient setting, you will be told whom to contact to report any reactions. Get help right away if: You  have symptoms of a serious allergic or immune system reaction, including: Trouble breathing or shortness of breath. Swelling of the face, feeling flushed, or widespread rash. Dark urine or blood in the urine. Fast heartbeat. These symptoms may be an emergency. Get help right away. Call 911. Do not wait to see if the symptoms will go away. Do not drive yourself to the hospital. Summary Bruising and soreness around the IV insertion site are common. Check your IV insertion site every day for signs of infection. Rest as told by your health care provider. Return to your normal activities as told by your health care provider. Get help right away for symptoms of a serious allergic or immune system reaction to the blood transfusion. This information is not intended to replace advice given to you by your health care provider. Make sure you discuss any questions you have with your health care provider. Document Revised: 03/15/2022 Document Reviewed: 03/15/2022 Elsevier Patient Education  2024 [employer]  

## 2023-06-18 LAB — TYPE AND SCREEN
ABO/RH(D): O POS
Donor AG Type: NEGATIVE
Donor AG Type: NEGATIVE
Unit division: 0
Unit division: 0

## 2023-06-18 LAB — BPAM RBC
Blood Product Expiration Date: 202407232359
Unit Type and Rh: 5100

## 2023-06-19 ENCOUNTER — Encounter (HOSPITAL_COMMUNITY): Payer: Self-pay | Admitting: Gastroenterology

## 2023-06-19 ENCOUNTER — Other Ambulatory Visit: Payer: Self-pay

## 2023-06-19 NOTE — Progress Notes (Signed)
RUE restriction  COVID Vaccine Completed: yes  Date of COVID positive in last 90 days: no  PCP - [facility]  Oncologist- Andersyn Fragoso Moulds, MD Cardiologist -   Chest x-ray - 10/01/22 Epic EKG - 06/28/22 Epic Stress Test - n/a ECHO - n/a Cardiac Cath - n/a Pacemaker/ICD device last checked: n/a Spinal Cord Stimulator: n/a  Bowel Prep - no per pt  Sleep Study - n/a CPAP -   Fasting Blood Sugar - n/a Checks Blood Sugar _____ times a day  Last dose of GLP1 agonist-  N/A GLP1 instructions:  N/A   Last dose of SGLT-2 inhibitors-  N/A SGLT-2 instructions: N/A   Blood Thinner Instructions: Eliquis hold 3 days Aspirin Instructions: Last Dose: 06/21/23 2300  Activity level: Can go up a flight of stairs and perform activities of daily living without stopping and without symptoms of chest pain. SOB with activity   Anesthesia review: DVT, portal HTN, lung nodule, thrombocytopenia cirrhosis, heart murmur  Patient denies shortness of breath, fever, cough and chest pain   Patient verbalized understanding of instructions that were given to t

## 2023-06-19 NOTE — Anesthesia Preprocedure Evaluation (Addendum)
Anesthesia Evaluation  Patient identified by MRN, date of birth, ID band Patient awake    Reviewed: Allergy & Precautions, NPO status , Patient's Chart, lab work & pertinent test results, reviewed documented beta blocker date and time   Airway Mallampati: II  TM Distance: >3 FB Neck ROM: Full    Dental no notable dental hx.    Pulmonary neg pulmonary ROS   Pulmonary exam normal breath sounds clear to auscultation       Cardiovascular hypertension, Pt. on home beta blockers and Pt. on medications + Peripheral Vascular Disease and + DVT  Normal cardiovascular exam Rhythm:Regular Rate:Normal     Neuro/Psych  PSYCHIATRIC DISORDERS  Depression    negative neurological ROS     GI/Hepatic ,GERD  ,,(+) Cirrhosis   Esophageal Varices      Endo/Other  negative endocrine ROS    Renal/GU negative Renal ROS  negative genitourinary   Musculoskeletal negative musculoskeletal ROS (+)    Abdominal   Peds  Hematology  (+) Blood dyscrasia (eliquis), anemia Lab Results      Component                Value               Date                      WBC                      3.8 (L)             06/24/2023                HGB                      8.9 (L)             06/24/2023                HCT                      28.5 (L)            06/24/2023                MCV                      102.5 (H)           06/24/2023                PLT                      173                 06/24/2023              Anesthesia Other Findings   Reproductive/Obstetrics                             Anesthesia Physical Anesthesia Plan  ASA: 3  Anesthesia Plan: MAC   Post-op Pain Management:    Induction: Intravenous  PONV Risk Score and Plan: Propofol infusion and Treatment may vary due to age or medical condition  Airway Management Planned: Natural Airway  Additional Equipment:   Intra-op Plan:   Post-operative Plan:    Informed Consent: I have reviewed the patients History and Physical, chart,  labs and discussed the procedure including the risks, benefits and alternatives for the proposed anesthesia with the patient or authorized representative who has indicated his/her understanding and acceptance.     Dental advisory given  Plan Discussed with: CRNA  Anesthesia Plan Comments: (Echo 08/02/10 (for chemo evaluation): Study Conclusions   Left ventricle: The cavity size was normal. Wall thickness was   normal. Systolic function was normal. The estimated ejection   fraction was in the range of 60% to 65%. Wall motion was normal;   there were no regional wall motion abnormalities. Left ventricular   diastolic function parameters were normal.  )        Anesthesia Quick Evaluation

## 2023-06-24 ENCOUNTER — Inpatient Hospital Stay: Payer: Medicare Other

## 2023-06-24 ENCOUNTER — Other Ambulatory Visit: Payer: Self-pay

## 2023-06-24 VITALS — BP 125/71 | HR 90 | Temp 98.7°F | Resp 16

## 2023-06-24 DIAGNOSIS — M810 Age-related osteoporosis without current pathological fracture: Secondary | ICD-10-CM

## 2023-06-24 DIAGNOSIS — D509 Iron deficiency anemia, unspecified: Secondary | ICD-10-CM

## 2023-06-24 DIAGNOSIS — Z5111 Encounter for antineoplastic chemotherapy: Secondary | ICD-10-CM | POA: Diagnosis not present

## 2023-06-24 DIAGNOSIS — C50411 Malignant neoplasm of upper-outer quadrant of right female breast: Secondary | ICD-10-CM

## 2023-06-24 LAB — COMPREHENSIVE METABOLIC PANEL
ALT: 15 U/L (ref 0–44)
AST: 16 U/L (ref 15–41)
Albumin: 3.7 g/dL (ref 3.5–5.0)
Alkaline Phosphatase: 75 U/L (ref 38–126)
Anion gap: 8 (ref 5–15)
BUN: 21 mg/dL (ref 8–23)
CO2: 24 mmol/L (ref 22–32)
Calcium: 8.8 mg/dL — ABNORMAL LOW (ref 8.9–10.3)
Chloride: 105 mmol/L (ref 98–111)
Creatinine, Ser: 1.25 mg/dL — ABNORMAL HIGH (ref 0.44–1.00)
GFR, Estimated: 47 mL/min — ABNORMAL LOW (ref >60)
Glucose, Bld: 102 mg/dL — ABNORMAL HIGH (ref 70–99)
Potassium: 4.6 mmol/L (ref 3.5–5.1)
Sodium: 137 mmol/L (ref 135–145)
Total Bilirubin: 0.9 mg/dL (ref 0.3–1.2)
Total Protein: 6.7 g/dL (ref 6.5–8.1)

## 2023-06-24 LAB — CBC WITH DIFFERENTIAL (CANCER CENTER ONLY)
Abs Immature Granulocytes: 0.04 10*3/uL (ref 0.00–0.07)
Basophils Absolute: 0 10*3/uL (ref 0.0–0.1)
Basophils Relative: 1 %
Eosinophils Absolute: 0.1 10*3/uL (ref 0.0–0.5)
Eosinophils Relative: 3 %
HCT: 28.5 % — ABNORMAL LOW (ref 36.0–46.0)
Hemoglobin: 8.9 g/dL — ABNORMAL LOW (ref 12.0–15.0)
Immature Granulocytes: 1 %
Lymphocytes Relative: 9 %
Lymphs Abs: 0.3 10*3/uL — ABNORMAL LOW (ref 0.7–4.0)
MCH: 32 pg (ref 26.0–34.0)
MCHC: 31.2 g/dL (ref 30.0–36.0)
MCV: 102.5 fL — ABNORMAL HIGH (ref 80.0–100.0)
Monocytes Absolute: 0.2 10*3/uL (ref 0.1–1.0)
Monocytes Relative: 4 %
Neutro Abs: 3.1 10*3/uL (ref 1.7–7.7)
Neutrophils Relative %: 82 %
Platelet Count: 173 10*3/uL (ref 150–400)
RBC: 2.78 MIL/uL — ABNORMAL LOW (ref 3.87–5.11)
RDW: 23.1 % — ABNORMAL HIGH (ref 11.5–15.5)
WBC Count: 3.8 10*3/uL — ABNORMAL LOW (ref 4.0–10.5)
nRBC: 0 % (ref 0.0–0.2)

## 2023-06-24 LAB — SAMPLE TO BLOOD BANK

## 2023-06-24 MED ORDER — FULVESTRANT 250 MG/5ML IM SOSY
500.0000 mg | PREFILLED_SYRINGE | Freq: Once | INTRAMUSCULAR | Status: AC
  Administered 2023-06-24: 500 mg via INTRAMUSCULAR
  Filled 2023-06-24: qty 10

## 2023-06-25 ENCOUNTER — Ambulatory Visit (HOSPITAL_COMMUNITY): Payer: Medicare Other | Admitting: Medical

## 2023-06-25 ENCOUNTER — Ambulatory Visit (HOSPITAL_BASED_OUTPATIENT_CLINIC_OR_DEPARTMENT_OTHER): Payer: Medicare Other | Admitting: Medical

## 2023-06-25 ENCOUNTER — Ambulatory Visit (HOSPITAL_COMMUNITY)
Admission: RE | Admit: 2023-06-25 | Discharge: 2023-06-25 | Disposition: A | Discharge status: Home / Self Care | Payer: Medicare Other | Attending: Gastroenterology | Admitting: Gastroenterology

## 2023-06-25 ENCOUNTER — Other Ambulatory Visit: Payer: Self-pay

## 2023-06-25 ENCOUNTER — Encounter (HOSPITAL_COMMUNITY): Payer: Self-pay | Admitting: Gastroenterology

## 2023-06-25 ENCOUNTER — Encounter (HOSPITAL_COMMUNITY)
Admission: RE | Disposition: A | Discharge status: Home / Self Care | Payer: Self-pay | Source: Home / Self Care | Attending: Gastroenterology

## 2023-06-25 DIAGNOSIS — Z86718 Personal history of other venous thrombosis and embolism: Secondary | ICD-10-CM | POA: Insufficient documentation

## 2023-06-25 DIAGNOSIS — K2289 Other specified disease of esophagus: Secondary | ICD-10-CM

## 2023-06-25 DIAGNOSIS — K766 Portal hypertension: Secondary | ICD-10-CM | POA: Insufficient documentation

## 2023-06-25 DIAGNOSIS — K3189 Other diseases of stomach and duodenum: Secondary | ICD-10-CM | POA: Insufficient documentation

## 2023-06-25 DIAGNOSIS — K219 Gastro-esophageal reflux disease without esophagitis: Secondary | ICD-10-CM | POA: Diagnosis not present

## 2023-06-25 DIAGNOSIS — I10 Essential (primary) hypertension: Secondary | ICD-10-CM | POA: Diagnosis not present

## 2023-06-25 DIAGNOSIS — D500 Iron deficiency anemia secondary to blood loss (chronic): Secondary | ICD-10-CM | POA: Diagnosis not present

## 2023-06-25 DIAGNOSIS — I739 Peripheral vascular disease, unspecified: Secondary | ICD-10-CM

## 2023-06-25 DIAGNOSIS — Z79899 Other long term (current) drug therapy: Secondary | ICD-10-CM | POA: Insufficient documentation

## 2023-06-25 DIAGNOSIS — I8510 Secondary esophageal varices without bleeding: Secondary | ICD-10-CM | POA: Diagnosis not present

## 2023-06-25 DIAGNOSIS — K31819 Angiodysplasia of stomach and duodenum without bleeding: Secondary | ICD-10-CM | POA: Insufficient documentation

## 2023-06-25 DIAGNOSIS — Z7901 Long term (current) use of anticoagulants: Secondary | ICD-10-CM | POA: Diagnosis not present

## 2023-06-25 DIAGNOSIS — K7460 Unspecified cirrhosis of liver: Secondary | ICD-10-CM | POA: Insufficient documentation

## 2023-06-25 DIAGNOSIS — F32A Depression, unspecified: Secondary | ICD-10-CM | POA: Diagnosis not present

## 2023-06-25 HISTORY — PX: RADIOFREQUENCY ABLATION: SHX2290

## 2023-06-25 HISTORY — PX: ESOPHAGOGASTRODUODENOSCOPY (EGD) WITH PROPOFOL: SHX5813

## 2023-06-25 SURGERY — ESOPHAGOGASTRODUODENOSCOPY (EGD) WITH PROPOFOL
Anesthesia: Monitor Anesthesia Care

## 2023-06-25 MED ORDER — PROPOFOL 500 MG/50ML IV EMUL
INTRAVENOUS | Status: DC | PRN
  Administered 2023-06-25: 20 mg via INTRAVENOUS
  Administered 2023-06-25: 130 ug/kg/min via INTRAVENOUS

## 2023-06-25 MED ORDER — LACTATED RINGERS IV SOLN: INTRAVENOUS | Status: DC

## 2023-06-25 MED ORDER — PROPOFOL 1000 MG/100ML IV EMUL
INTRAVENOUS | Status: AC
  Filled 2023-06-25: qty 100

## 2023-06-25 MED ORDER — SODIUM CHLORIDE 0.9 % IV SOLN: INTRAVENOUS | Status: DC

## 2023-06-25 MED ORDER — LIDOCAINE 2% (20 MG/ML) 5 ML SYRINGE
INTRAMUSCULAR | Status: DC | PRN
  Administered 2023-06-25: 60 mg via INTRAVENOUS

## 2023-06-25 SURGICAL SUPPLY — 15 items

## 2023-06-25 NOTE — Anesthesia Postprocedure Evaluation (Signed)
Anesthesia Post Note  Patient: Nicole [name]  Procedure(s) Performed: ESOPHAGOGASTRODUODENOSCOPY (EGD) WITH PROPOFOL RADIO FREQUENCY ABLATION     Patient location during evaluation: PACU Anesthesia Type: MAC Level of consciousness: awake and alert Pain management: pain level controlled Vital Signs Assessment: post-procedure vital signs reviewed and stable Respiratory status: spontaneous breathing, nonlabored ventilation, respiratory function stable and patient connected to nasal cannula oxygen Cardiovascular status: stable and blood pressure returned to baseline Postop Assessment: no apparent nausea or vomiting Anesthetic complications: no  No notable events documented.  Last Vitals:  Vitals:   06/25/23 1100 06/25/23 1110  BP: (!) 102/55 (!) 113/45  Pulse: 70 69  Resp: 17 16  Temp:    SpO2: 100% 100%    Last Pain:  Vitals:   06/25/23 1110  TempSrc:   PainSc: 0-No pain                 Mariachristina Holle L Keyna Blizard

## 2023-06-25 NOTE — Anesthesia Procedure Notes (Signed)
Date/Time: 06/25/2023 10:20 AM  Performed by: Elisabeth Cara, CRNAPre-anesthesia Checklist: Patient identified, Emergency Drugs available, Suction available, Patient being monitored and Timeout performed Patient Re-evaluated:Patient Re-evaluated prior to induction Oxygen Delivery Method: Simple face mask Placement Confirmation: positive ETCO2 Dental Injury: Teeth and Oropharynx as per pre-operative assessment

## 2023-06-25 NOTE — Transfer of Care (Signed)
Immediate Anesthesia Transfer of Care Note  Patient: Nicole [name]  Procedure(s) Performed: ESOPHAGOGASTRODUODENOSCOPY (EGD) WITH PROPOFOL RADIO FREQUENCY ABLATION  Patient Location: PACU and Endoscopy Unit  Anesthesia Type:MAC  Level of Consciousness: awake, alert , and oriented  Airway & Oxygen Therapy: Patient Spontanous Breathing and Patient connected to face mask oxygen  Post-op Assessment: Report given to RN and Post -op Vital signs reviewed and stable  Post vital signs: Reviewed and stable  Last Vitals:  Vitals Value Taken Time  BP    Temp    Pulse    Resp    SpO2      Last Pain:  Vitals:   06/25/23 0820  TempSrc: Temporal  PainSc: 0-No pain         Complications: No notable events documented.

## 2023-06-25 NOTE — Discharge Instructions (Addendum)
YOU HAD AN ENDOSCOPIC PROCEDURE TODAY: Refer to the procedure report and other information in the discharge instructions given to you for any specific questions about what was found during the examination. If this information does not answer your questions, please call the [facility] office at [phone] to clarify.   YOU SHOULD EXPECT: Some feelings of bloating in the abdomen. Passage of more gas than usual. Walking can help get rid of the air that was put into your GI tract during the procedure and reduce the bloating.  You can take all your medicine and restart your Eliquis on Friday if doing okay without signs of bleeding or other GI problem  DIET: Begin with liquids only and if you are doing well at 1 PM may have your first meal following the procedure should be a light meal and then it is ok to progress to your normal diet. A half-sandwich or bowl of soup is an example of a good first meal. Heavy or fried foods are harder to digest and may make you feel nauseous or bloated. Drink plenty of fluids but you should avoid alcoholic beverages for 24 hours.   ACTIVITY: Your care partner should take you home directly after the procedure. You should plan to take it easy, moving slowly for the rest of the day. You can resume normal activity the day after the procedure however YOU SHOULD NOT DRIVE, use power tools, machinery or perform tasks that involve climbing or major physical exertion for 24 hours (because of the sedation medicines used during the test).   SYMPTOMS TO REPORT IMMEDIATELY: A gastroenterologist can be reached at any hour. Please call [phone]  for any of the following symptoms:   Following upper endoscopy (EGD, EUS, ERCP, esophageal dilation) Vomiting of blood or coffee ground material  New, significant abdominal pain  New, significant chest pain or pain under the shoulder blades  Painful or persistently difficult swallowing  New shortness of breath  Black, tarry-looking or red,  bloody stools  FOLLOW UP: Call if GI question or problem otherwise follow-up with Dr. Marca Ancona in 1 month and will consider repeat treatment in 2 months If any biopsies were taken you will be contacted by phone or by letter within the next 1-3 weeks. Call [phone]  if you have not heard about the biopsies in 3 weeks.  Please also call with any specific questions about appointments or follow up tests.

## 2023-06-25 NOTE — Progress Notes (Signed)
Nicole [name] 10:01 AM  Subjective: Patient's history was reviewed and her hospital computer reviewed and we discussed her cirrhosis and blood clot and reviewed her recent labs he has no new complaints and stopped her Eliquis on Saturday  Objective: Vital signs stable afebrile no acute distress exam please see preassessment evaluation  Assessment: Cirrhosis Eliquis chronic GI blood loss  Plan: Okay to proceed with endoscopy with anesthesia assistance  [facility]  office [phone] After 5PM or if no answer call [phone]

## 2023-06-25 NOTE — Op Note (Signed)
[facility] Patient Name: Nicole [name] Procedure Date: 06/25/2023 MRN: [id number] Attending MD: Vida Rigger , MD, [id number] Date of Birth: 1956/02/04 CSN: [id number] Age: 67 Admit Type: Inpatient Procedure:                Upper GI endoscopy Indications:              Iron deficiency anemia secondary to chronic blood                            loss in patient with cirrhosis Providers:                Vida Rigger, MD, Eliberto Ivory, RN, Adolph Pollack,                            RN, Sunday Corn Mbumina, Technician Referring MD:              Medicines:                Monitored Anesthesia Care Complications:            No immediate complications. Estimated Blood Loss:     Estimated blood loss was minimal. Procedure:                Pre-Anesthesia Assessment:                           - Prior to the procedure, a History and Physical                            was performed, and patient medications and                            allergies were reviewed. The patient's tolerance of                            previous anesthesia was also reviewed. The risks                            and benefits of the procedure and the sedation                            options and risks were discussed with the patient.                            All questions were answered, and informed consent                            was obtained. Prior Anticoagulants: The patient has                            taken Eliquis (apixaban), last dose was 4 days                            prior to procedure. ASA Grade Assessment: II - A  patient with mild systemic disease. After reviewing                            the risks and benefits, the patient was deemed in                            satisfactory condition to undergo the procedure.                           After obtaining informed consent, the endoscope was                            passed under direct vision. Throughout the                             procedure, the patient's blood pressure, pulse, and                            oxygen saturations were monitored continuously. The                            [id number]-H190 ([id number]) Olympus endoscope was introduced                            through the mouth, and advanced to the third part                            of duodenum. The upper GI endoscopy was                            accomplished without difficulty. The patient                            tolerated the procedure well. Scope In: Scope Out: Findings:      Localized post-therapy mucosal sclerosis was found in the lower third of       the esophagus. No residual varices were seen      Mild gastric antral vascular ectasia without bleeding was present in the       gastric antrum. Focal radiofrequency ablation of gastric antral vascular       ectasia in the stomach was performed. Ablation sites were located at 49       cm from the incisors. With the endoscope in place, the position and       extent of the abnormal mucosa and appropriate anatomic landmarks were       noted. The radiofrequency channel ablation catheter was introduced       through the endoscope working channel. The endoscope with the ablation       catheter was advanced to the areas of abnormal mucosa. The endoscope       with the channel ablation catheter was positioned under direct       visualization so that the catheter was placed in contact with the       surface of the abnormal mucosa. Energy was applied. Ablation was       repeated in a likewise fashion to  both visible abnormal mucosa. The       ablation catheter was removed through the endoscope working channel. The       areas where abnormal mucosa had been ablated were examined. Areas of       abnormal mucosa appeared almost completely ablated.      Mild portal hypertensive gastropathy was found in the gastric body.      The duodenal bulb, first portion of the duodenum, second portion of  the       duodenum and third portion of the duodenum were normal.      The exam was otherwise without abnormality. Impression:               - Esophageal mucosal sclerosis.                           - Gastric antral vascular ectasia without bleeding.                            Treated with radiofrequency ablation.                           - Portal hypertensive gastropathy.                           - Normal duodenal bulb, first portion of the                            duodenum, second portion of the duodenum and third                            portion of the duodenum.                           - The examination was otherwise normal.                           - No specimens collected. Moderate Sedation:      Not Applicable - Patient had care per Anesthesia. Recommendation:           - Patient has a contact number available for                            emergencies. The signs and symptoms of potential                            delayed complications were discussed with the                            patient. Return to normal activities tomorrow.                            Written discharge instructions were provided to the                            patient.                           -  Full liquid diet for 2 hours.                           - Continue present medications.                           - Resume Eliquis (apixaban) at prior dose in 2 days.                           - Return to GI clinic in 1 month.                           - Telephone GI clinic if symptomatic PRN.                           - Repeat upper endoscopy in 2 months to evaluate                            the response to therapy. Procedure Code(s):        --- Professional ---                           [phone], Esophagogastroduodenoscopy, flexible,                            transoral; with control of bleeding, any method Diagnosis Code(s):        --- Professional ---                           K22.89, Other specified  disease of esophagus                           K31.819, Angiodysplasia of stomach and duodenum                            without bleeding                           K76.6, Portal hypertension                           K31.89, Other diseases of stomach and duodenum                           D50.0, Iron deficiency anemia secondary to blood                            loss (chronic) CPT copyright 2022 [employer]. All rights reserved. The codes documented in this report are preliminary and upon coder review may  be revised to meet current compliance requirements. Vida Rigger, MD 06/25/2023 11:23:49 AM This report has been signed electronically. Number of Addenda: 0

## 2023-06-27 ENCOUNTER — Encounter (HOSPITAL_COMMUNITY): Payer: Self-pay | Admitting: Gastroenterology

## 2023-07-15 ENCOUNTER — Inpatient Hospital Stay: Payer: Medicare Other

## 2023-07-15 ENCOUNTER — Inpatient Hospital Stay: Payer: Medicare Other | Admitting: Hematology and Oncology

## 2023-07-22 ENCOUNTER — Inpatient Hospital Stay: Payer: Medicare Other

## 2023-07-22 ENCOUNTER — Inpatient Hospital Stay (HOSPITAL_BASED_OUTPATIENT_CLINIC_OR_DEPARTMENT_OTHER): Payer: Medicare Other | Admitting: Hematology and Oncology

## 2023-07-22 ENCOUNTER — Inpatient Hospital Stay: Payer: Medicare Other | Attending: Hematology and Oncology

## 2023-07-22 VITALS — BP 110/70 | HR 89 | Temp 97.8°F | Resp 17 | Wt 125.7 lb

## 2023-07-22 DIAGNOSIS — C50911 Malignant neoplasm of unspecified site of right female breast: Secondary | ICD-10-CM

## 2023-07-22 DIAGNOSIS — C778 Secondary and unspecified malignant neoplasm of lymph nodes of multiple regions: Secondary | ICD-10-CM | POA: Insufficient documentation

## 2023-07-22 DIAGNOSIS — C7800 Secondary malignant neoplasm of unspecified lung: Secondary | ICD-10-CM | POA: Diagnosis not present

## 2023-07-22 DIAGNOSIS — Z5111 Encounter for antineoplastic chemotherapy: Secondary | ICD-10-CM | POA: Diagnosis present

## 2023-07-22 DIAGNOSIS — C7801 Secondary malignant neoplasm of right lung: Secondary | ICD-10-CM | POA: Insufficient documentation

## 2023-07-22 DIAGNOSIS — D509 Iron deficiency anemia, unspecified: Secondary | ICD-10-CM

## 2023-07-22 DIAGNOSIS — D649 Anemia, unspecified: Secondary | ICD-10-CM | POA: Insufficient documentation

## 2023-07-22 DIAGNOSIS — C50411 Malignant neoplasm of upper-outer quadrant of right female breast: Secondary | ICD-10-CM | POA: Diagnosis not present

## 2023-07-22 DIAGNOSIS — Z170 Estrogen receptor positive status [ER+]: Secondary | ICD-10-CM | POA: Diagnosis not present

## 2023-07-22 DIAGNOSIS — M810 Age-related osteoporosis without current pathological fracture: Secondary | ICD-10-CM

## 2023-07-22 DIAGNOSIS — C7802 Secondary malignant neoplasm of left lung: Secondary | ICD-10-CM | POA: Insufficient documentation

## 2023-07-22 LAB — CBC WITH DIFFERENTIAL/PLATELET
Abs Immature Granulocytes: 0.02 10*3/uL (ref 0.00–0.07)
Basophils Absolute: 0 10*3/uL (ref 0.0–0.1)
Basophils Relative: 1 %
Eosinophils Absolute: 0.1 10*3/uL (ref 0.0–0.5)
Eosinophils Relative: 2 %
HCT: 29.5 % — ABNORMAL LOW (ref 36.0–46.0)
Hemoglobin: 9.8 g/dL — ABNORMAL LOW (ref 12.0–15.0)
Immature Granulocytes: 1 %
Lymphocytes Relative: 11 %
Lymphs Abs: 0.3 10*3/uL — ABNORMAL LOW (ref 0.7–4.0)
MCH: 33 pg (ref 26.0–34.0)
MCHC: 33.2 g/dL (ref 30.0–36.0)
MCV: 99.3 fL (ref 80.0–100.0)
Monocytes Absolute: 0.2 10*3/uL (ref 0.1–1.0)
Monocytes Relative: 5 %
Neutro Abs: 2.5 10*3/uL (ref 1.7–7.7)
Neutrophils Relative %: 80 %
Platelets: 184 10*3/uL (ref 150–400)
RBC: 2.97 MIL/uL — ABNORMAL LOW (ref 3.87–5.11)
RDW: 17.1 % — ABNORMAL HIGH (ref 11.5–15.5)
WBC: 3.1 10*3/uL — ABNORMAL LOW (ref 4.0–10.5)
nRBC: 0 % (ref 0.0–0.2)

## 2023-07-22 LAB — COMPREHENSIVE METABOLIC PANEL
ALT: 12 U/L (ref 0–44)
AST: 17 U/L (ref 15–41)
Albumin: 4.1 g/dL (ref 3.5–5.0)
Alkaline Phosphatase: 66 U/L (ref 38–126)
Anion gap: 8 (ref 5–15)
BUN: 23 mg/dL (ref 8–23)
CO2: 24 mmol/L (ref 22–32)
Calcium: 9.2 mg/dL (ref 8.9–10.3)
Chloride: 107 mmol/L (ref 98–111)
Creatinine, Ser: 1.15 mg/dL — ABNORMAL HIGH (ref 0.44–1.00)
GFR, Estimated: 52 mL/min — ABNORMAL LOW (ref >60)
Glucose, Bld: 88 mg/dL (ref 70–99)
Potassium: 4.1 mmol/L (ref 3.5–5.1)
Sodium: 139 mmol/L (ref 135–145)
Total Bilirubin: 0.6 mg/dL (ref 0.3–1.2)
Total Protein: 6.6 g/dL (ref 6.5–8.1)

## 2023-07-22 LAB — IRON AND IRON BINDING CAPACITY (CC-WL,HP ONLY)
Iron: 98 ug/dL (ref 28–170)
Saturation Ratios: 18 % (ref 10.4–31.8)
TIBC: 547 ug/dL — ABNORMAL HIGH (ref 250–450)
UIBC: 449 ug/dL — ABNORMAL HIGH (ref 148–442)

## 2023-07-22 LAB — FERRITIN: Ferritin: 12 ng/mL (ref 11–307)

## 2023-07-22 LAB — SAMPLE TO BLOOD BANK

## 2023-07-22 MED ORDER — FULVESTRANT 250 MG/5ML IM SOSY
500.0000 mg | PREFILLED_SYRINGE | Freq: Once | INTRAMUSCULAR | Status: AC
  Administered 2023-07-22: 500 mg via INTRAMUSCULAR
  Filled 2023-07-22: qty 10

## 2023-07-22 NOTE — Progress Notes (Signed)
[facility]  Telephone:([phone] Fax:([phone]    ID: Nicole [name]   DOB: [birth year]  MR#: [id number]  HKV#:[id number]  Patient Care Team: Nicole Moulds, MD as PCP - General (Hematology and Oncology) Nicole [name], CRNP as Nurse Practitioner (Nurse Practitioner) Nicole Loron, MD as Consulting Physician (General Surgery) Nicole Salen, MD as Consulting Physician (Gastroenterology)  CHIEF COMPLAINT:  Metastatic Breast Cancer (s/p right mastectomy)  CURRENT TREATMENT: Ilda Foil and Faslodex  INTERVAL HISTORY:  Nicole [name] returns today for follow-up of her estrogen receptor positive stage IV breast cancer.   Since her last visit here she was seen by Dr. Charyl [name] from GI, had an endoscopy and this showed mild gastric antral vascular ectasia without bleeding focal radiofrequency ablation of the GAVE was performed.She continues to feel fatigued, better than before but not still quite there. She is on Ibrance and faslodex again.  She denies any changes in her health other than the fatigue.  No bleeding noted in her stool.  No black stool.  No change in her breathing.  No new neurological complaints.  Rest of the pertinent 10 point ROS reviewed and negative  COVID 19 VACCINATION STATUS: Status post Pfizer x2+ booster August 2021   BREAST CANCER HISTORY: From Dr. Theron Arista [name]'s 07/25/2010 note:  "This woman has not had any significant medical intervention for some time.  Her last mammogram was about seven years ago.  She noted a right breast mass about a year ago and did not seek immediate medical attention for this.  She ultimately, after some time, admitted this to her husband and self-referred herself for intervention.  She had a mammogram on 07/17/2010 with an ultrasound of the right breast.  This showed a lobulated mass upper right outer quadrant measuring at least 6.3 x 7.3 cm and large right axillary lymph nodes were also noted.  There was skin thickening overlying the  mass.  On physical exam, this mass was about an 8 cm with skin dimpling noted. Discolored area in the skin over the mass, fullness in the axilla.  Biopsy was recommended, which took place on 07/17/2010.  Pathology showed invasive ductal cancer involving both lymph node and breast.  The HER-2 was not amplified.  ER and PR both positive at 100% and 6% respectively.  Proliferative index was 12% involving both the lymph node and breast mass.  An MRI scan of both breasts was performed on 07/22/2010, essentially which showed a large heterogeneously enhancing mass of the right breast with washout kinetics measuring 6.4 x 4.4 x 5.0 cm.  Numerous satellite nodules were seen throughout the dominant mass.  There were several suspicious nodules in the upper central and upper inner quadrant of the right breast.  There were two enhancing subcentimeter nodules seen in the right pectoralis muscle.  No obvious chest wall nodules were seen; however, there was seen some metastatic adenopathy in the mediastinum as well as hila with a 2.4 x 2.0 cm mediastinal lymph node as well as 1.2 x 3.0 cm subcarinal lymph node.  There was a right hilar and probable left hilar adenopathy.  Bilateral pulmonary nodules were seen and a right T2 lesion was seen anterior right liver as well."  Her subsequent history is as detailed below.   PAST MEDICAL HISTORY: Past Medical History:  Diagnosis Date   Anemia    low iron   Arthritis    fingers   Blood transfusion 2012   Breast CA (HCC)    breast ca dx 06/2010, stage  4, right , Lung Metasis.Surgery, Chemo, Radiation   Breast cancer (HCC)    Clot    jugular  ?2015   Depression    Esophageal varices (HCC)    Headache    Mirgraine- rare since menopause   Heart murmur    mild, no cardiologist, from birth   Hematemesis 08/2017   History of kidney stones    no pain- shows in CT- small 1 mm bilaterally- kidney   Hypertension    IBD (inflammatory bowel disease)    resolved   Idiopathic  cirrhosis (HCC)    pt denies   Peripheral vascular disease (HCC) few yrs ago   clot in right jugular   Portal hypertension (HCC)    Radiation 07/21/12-09/07/12   5940 cGy   Restless legs     PAST SURGICAL HISTORY: Past Surgical History:  Procedure Laterality Date   BIOPSY  08/21/2020   Procedure: BIOPSY;  Surgeon: Nicole Salen, MD;  Location: [facility] ENDOSCOPY;  Service: Gastroenterology;;   COLONOSCOPY  2016   DILATION AND CURETTAGE OF UTERUS  2004?   ESOPHAGEAL BANDING  09/04/2017   Procedure: ESOPHAGEAL BANDING;  Surgeon: Nicole Salen, MD;  Location: [facility] ENDOSCOPY;  Service: Gastroenterology;;   ESOPHAGEAL BANDING  11/27/2017   Procedure: ESOPHAGEAL BANDING;  Surgeon: Nicole Salen, MD;  Location: [facility] ENDOSCOPY;  Service: Gastroenterology;;   ESOPHAGEAL BANDING N/A 04/28/2018   Procedure: ESOPHAGEAL BANDING;  Surgeon: Nicole Salen, MD;  Location: [facility] ENDOSCOPY;  Service: Gastroenterology;  Laterality: N/A;   ESOPHAGEAL BANDING  11/17/2018   Procedure: ESOPHAGEAL BANDING;  Surgeon: Nicole Salen, MD;  Location: [facility] ENDOSCOPY;  Service: Gastroenterology;;   ESOPHAGEAL BANDING  08/16/2019   Procedure: ESOPHAGEAL BANDING;  Surgeon: Nicole Salen, MD;  Location: [facility] ENDOSCOPY;  Service: Gastroenterology;;   ESOPHAGEAL BANDING N/A 08/21/2020   Procedure: ESOPHAGEAL BANDING;  Surgeon: Nicole Salen, MD;  Location: [facility] ENDOSCOPY;  Service: Gastroenterology;  Laterality: N/A;   ESOPHAGOGASTRODUODENOSCOPY N/A 04/28/2018   Procedure: ESOPHAGOGASTRODUODENOSCOPY (EGD);  Surgeon: Nicole Salen, MD;  Location: [facility] ENDOSCOPY;  Service: Gastroenterology;  Laterality: N/A;   ESOPHAGOGASTRODUODENOSCOPY N/A 11/17/2018   Procedure: ESOPHAGOGASTRODUODENOSCOPY (EGD);  Surgeon: Nicole Salen, MD;  Location: [facility] ENDOSCOPY;  Service: Gastroenterology;  Laterality: N/A;   ESOPHAGOGASTRODUODENOSCOPY N/A 08/13/2022   Procedure: ESOPHAGOGASTRODUODENOSCOPY (EGD);  Surgeon: Nicole Salen, MD;  Location: [facility] ENDOSCOPY;  Service: Gastroenterology;   Laterality: N/A;   ESOPHAGOGASTRODUODENOSCOPY (EGD) WITH PROPOFOL N/A 04/11/2016   Procedure: ESOPHAGOGASTRODUODENOSCOPY (EGD) WITH PROPOFOL;  Surgeon: Vida Rigger, MD;  Location: [facility] ENDOSCOPY;  Service: Endoscopy;  Laterality: N/A;   ESOPHAGOGASTRODUODENOSCOPY (EGD) WITH PROPOFOL N/A 07/08/2016   Procedure: ESOPHAGOGASTRODUODENOSCOPY (EGD) WITH PROPOFOL;  Surgeon: Carman Ching, MD;  Location: [facility] ENDOSCOPY;  Service: Endoscopy;  Laterality: N/A;   ESOPHAGOGASTRODUODENOSCOPY (EGD) WITH PROPOFOL N/A 03/03/2017   Procedure: ESOPHAGOGASTRODUODENOSCOPY (EGD) WITH PROPOFOL;  Surgeon: Carman Ching, MD;  Location: [facility] ENDOSCOPY;  Service: Endoscopy;  Laterality: N/A;   ESOPHAGOGASTRODUODENOSCOPY (EGD) WITH PROPOFOL N/A 09/04/2017   Procedure: ESOPHAGOGASTRODUODENOSCOPY (EGD) WITH PROPOFOL;  Surgeon: Nicole Salen, MD;  Location: [facility] ENDOSCOPY;  Service: Gastroenterology;  Laterality: N/A;   ESOPHAGOGASTRODUODENOSCOPY (EGD) WITH PROPOFOL N/A 11/27/2017   Procedure: ESOPHAGOGASTRODUODENOSCOPY (EGD);  Surgeon: Nicole Salen, MD;  Location: [facility] ENDOSCOPY;  Service: Gastroenterology;  Laterality: N/A;   ESOPHAGOGASTRODUODENOSCOPY (EGD) WITH PROPOFOL N/A 08/16/2019   Procedure: ESOPHAGOGASTRODUODENOSCOPY (EGD) WITH PROPOFOL;  Surgeon: Nicole Salen, MD;  Location: [facility] ENDOSCOPY;  Service: Gastroenterology;  Laterality: N/A;   ESOPHAGOGASTRODUODENOSCOPY (EGD) WITH PROPOFOL N/A 08/21/2020   Procedure: ESOPHAGOGASTRODUODENOSCOPY (EGD) WITH PROPOFOL;  Surgeon: Marca Ancona,  Marcos Eke, MD;  Location: [facility] ENDOSCOPY;  Service: Gastroenterology;  Laterality: N/A;   ESOPHAGOGASTRODUODENOSCOPY (EGD) WITH PROPOFOL N/A 08/24/2021   Procedure: ESOPHAGOGASTRODUODENOSCOPY (EGD) WITH PROPOFOL;  Surgeon: Nicole Salen, MD;  Location: [facility] ENDOSCOPY;  Service: Gastroenterology;  Laterality: N/A;   ESOPHAGOGASTRODUODENOSCOPY (EGD) WITH PROPOFOL N/A 06/25/2023   Procedure: ESOPHAGOGASTRODUODENOSCOPY (EGD) WITH PROPOFOL;  Surgeon: Vida Rigger, MD;  Location: [facility]  ENDOSCOPY;  Service: Gastroenterology;  Laterality: N/A;  W. bandging   GASTRIC VARICES BANDING N/A 08/24/2021   Procedure: GASTRIC VARICES BANDING;  Surgeon: Nicole Salen, MD;  Location: [facility] ENDOSCOPY;  Service: Gastroenterology;  Laterality: N/A;   GASTRIC VARICES BANDING N/A 08/13/2022   Procedure: GASTRIC VARICES BANDING;  Surgeon: Nicole Salen, MD;  Location: [facility] ENDOSCOPY;  Service: Gastroenterology;  Laterality: N/A;   HEMOSTASIS CLIP PLACEMENT  08/21/2020   Procedure: HEMOSTASIS CLIP PLACEMENT;  Surgeon: Nicole Salen, MD;  Location: [facility] ENDOSCOPY;  Service: Gastroenterology;;   INTERCOSTAL NERVE BLOCK Left 07/01/2022   Procedure: INTERCOSTAL NERVE BLOCK;  Surgeon: Loreli Slot, MD;  Location: [facility] OR;  Service: Thoracic;  Laterality: Left;   IR GENERIC HISTORICAL  05/15/2016   IR RADIOLOGIST EVAL & MGMT 05/15/2016 Gilmer Mor, DO [facility] INTERV RAD   IR GENERIC HISTORICAL  01/14/2017   IR US GUIDE VASC ACCESS RIGHT 01/14/2017 Berdine Dance, MD [facility]   IR GENERIC HISTORICAL  01/14/2017   IR VENOGRAM HEPATIC W HEMODYNAMIC EVALUATION 01/14/2017 Berdine Dance, MD [facility]   IR RADIOLOGIST EVAL & MGMT  06/12/2017   IR RADIOLOGIST EVAL & MGMT  08/11/2018   IR RADIOLOGIST EVAL & MGMT  08/25/2019   IR RADIOLOGIST EVAL & MGMT  08/22/2020   IR RADIOLOGIST EVAL & MGMT  03/11/2022   MASTECTOMY Right 06/02/12   right breast with lymph node removal   NODE DISSECTION Left 07/01/2022   Procedure: NODE DISSECTION;  Surgeon: Loreli Slot, MD;  Location[facility] OR;  Service: Thoracic;  Laterality: Left;   PORT-A-CATH REMOVAL     PORTACATH PLACEMENT  2011   left side   RADIOFREQUENCY ABLATION  06/25/2023   Procedure: RADIO FREQUENCY ABLATION;  Surgeon: Vida Rigger, MD;  Location: [facility] ENDOSCOPY;  Service: Gastroenterology;;   RADIOLOGY WITH ANESTHESIA N/A 04/13/2016   Procedure: RADIOLOGY WITH ANESTHESIA;  Surgeon: Gilmer Mor, DO;  Location: [facility] OR;  Service: Anesthesiology;  Laterality: N/A;   VIDEO  BRONCHOSCOPY WITH ENDOBRONCHIAL NAVIGATION N/A 07/01/2022   Procedure: VIDEO BRONCHOSCOPY WITH ENDOBRONCHIAL NAVIGATION;  Surgeon: Loreli Slot, MD;  Location: [facility] OR;  Service: Thoracic;  Laterality: N/A;   XI ROBOTIC ASSISTED THORACOSCOPY- SEGMENTECTOMY Left 07/01/2022   Procedure: XI ROBOTIC ASSISTED THORACOSCOPY-LEFT LOWER LOBE BASILAR SEGMENTECTOMY;  Surgeon: Loreli Slot, MD;  Location: [facility] OR;  Service: Thoracic;  Laterality: Left;    FAMILY HISTORY Family History  Problem Relation Age of Onset   COPD Mother    AAA (abdominal aortic aneurysm) Mother    COPD Father    Cancer Neg Hx   The patient's father died at the age of 60 from heart disease. Patient's mother died at the age of 68 status post CABG. The patient had no brothers, 2 sisters. There is no history of breast or ovarian cancer in the family.   GYNECOLOGIC HISTORY: Updated May 2018 Menarche age 83, she is GX P0. She does not recall when she went through menopause. She never took hormone replacement.   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 Husband [name] is retired from [employer]. They have no children and no pets. They attend a local [address]    ADVANCED DIRECTIVES: Not in place   HEALTH MAINTENANCE: Social History   Tobacco Use   Smoking status: Never   Smokeless tobacco: Never  Vaping Use   Vaping status: Never Used  Substance Use Topics   Alcohol use: No   Drug use: No     Colonoscopy: Never  PAP: Remote  Bone density: 09/27/2013, osteoporosis with a T score of -2.8  Lipid panel: Not on file    No Known Allergies   Current Outpatient Medications  Medication Sig Dispense Refill   denosumab (PROLIA) 60 MG/ML SOLN injection Inject 60 mg into the skin every 6 (six) months. Administer in upper arm, thigh, or abdomen     ELIQUIS 5 MG TABS tablet TAKE 1 TABLET BY MOUTH TWICE A  DAY 60 tablet 2   folic acid (FOLVITE) 1 MG tablet Take 1 mg by mouth daily.     furosemide (LASIX) 20 MG tablet Take 20 mg by mouth daily.     Milk Thistle 1000 MG CAPS Take 1,000 mg by mouth daily.     palbociclib (IBRANCE) 75 MG tablet Take 1 tablet (75 mg total) by mouth daily. Take for 21 days on, 7 days off, repeat every 28 days. 21 tablet 3   pantoprazole (PROTONIX) 40 MG tablet Take 40 mg by mouth daily.     propranolol (INDERAL) 20 MG tablet Take 1 tablet (20 mg total) by mouth 2 (two) times daily. 60 tablet 0   spironolactone (ALDACTONE) 25 MG tablet Take 25 mg by mouth daily.     venlafaxine XR (EFFEXOR-XR) 150 MG 24 hr capsule Take 1 capsule (150 mg total) by mouth daily with breakfast. 90 capsule 1   No current facility-administered medications for this visit.   Facility-Administered Medications Ordered in Other Visits  Medication Dose Route Frequency Provider Last Rate Last Admin   denosumab (PROLIA) injection 60 mg  60 mg Subcutaneous Once Magrinat, Valentino Hue, MD        OBJECTIVE: white woman who appears younger than stated age  Vitals:   07/22/23 1402  BP: 110/70  Pulse: 89  Resp: 17  Temp: 97.8 F (36.6 C)  TempSrc: Temporal  SpO2: 100%  Weight: 125 lb 11.2 oz (57 kg)    Physical Exam Constitutional:      Appearance: Normal appearance. She is not ill-appearing.     Comments:    Cardiovascular:     Rate and Rhythm: Normal rate and regular rhythm.     Pulses: Normal pulses.     Heart sounds: Normal heart sounds.  Pulmonary:     Effort: Pulmonary effort is normal.     Breath sounds: Normal breath sounds.  Abdominal:     General: There is no distension.  Musculoskeletal:        General: No swelling or tenderness. Normal range of motion.     Cervical back: Normal range of motion. No rigidity.  Lymphadenopathy:     Cervical: No cervical adenopathy.  Skin:    General: Skin is warm and dry.  Neurological:     General: No focal deficit present.     Mental  Status: She is alert.    LAB RESULTS:  CMP     Component Value Date/Time   NA 139 07/22/2023 1320   NA 142 11/17/2017 1355  K 4.1 07/22/2023 1320   K 4.4 11/17/2017 1355   CL 107 07/22/2023 1320   CL 109 (H) 04/20/2013 0859   CO2 24 07/22/2023 1320   CO2 22 11/17/2017 1355   GLUCOSE 88 07/22/2023 1320   GLUCOSE 86 11/17/2017 1355   GLUCOSE 91 04/20/2013 0859   BUN 23 07/22/2023 1320   BUN 12.5 11/17/2017 1355   CREATININE 1.15 (H) 07/22/2023 1320   CREATININE 1.07 (H) 09/23/2022 1327   CREATININE 1.1 11/17/2017 1355   CALCIUM 9.2 07/22/2023 1320   CALCIUM 9.2 11/17/2017 1355   PROT 6.6 07/22/2023 1320   PROT 7.0 11/17/2017 1355   ALBUMIN 4.1 07/22/2023 1320   ALBUMIN 3.8 11/17/2017 1355   AST 17 07/22/2023 1320   AST 14 (L) 09/23/2022 1327   AST 23 11/17/2017 1355   ALT 12 07/22/2023 1320   ALT 11 09/23/2022 1327   ALT 22 11/17/2017 1355   ALKPHOS 66 07/22/2023 1320   ALKPHOS 55 11/17/2017 1355   BILITOT 0.6 07/22/2023 1320   BILITOT 0.7 09/23/2022 1327   BILITOT 0.49 11/17/2017 1355   GFRNONAA 52 (L) 07/22/2023 1320   GFRNONAA 57 (L) 09/23/2022 1327   GFRNONAA 53 (L) 05/09/2016 1104   GFRAA 53 (L) 06/12/2020 1146   GFRAA 55 (L) 12/13/2019 1200   GFRAA 61 05/09/2016 1104    No results found for: "TOTALPROTELP", "ALBUMINELP", "A1GS", "A2GS", "BETS", "BETA2SER", "GAMS", "MSPIKE", "SPEI"  Lab Results  Component Value Date   WBC 3.1 (L) 07/22/2023   NEUTROABS 2.5 07/22/2023   HGB 9.8 (L) 07/22/2023   HCT 29.5 (L) 07/22/2023   MCV 99.3 07/22/2023   PLT 184 07/22/2023    Lab Results  Component Value Date   LABCA2 18 08/21/2012    No components found for: "WNUUVO536"  No results for input(s): "INR" in the last 168 hours.  Lab Results  Component Value Date   LABCA2 18 08/21/2012    No results found for: "UYQ034"  No results found for: "CAN125"  No results found for: "VQQ595"  Lab Results  Component Value Date   CA2729 26.9 12/13/2019     No components found for: "HGQUANT"  No results found for: "CEA1", "CEA" / No results found for: "CEA1", "CEA"   No results found for: "AFPTUMOR"  No results found for: "CHROMOGRNA"  No results found for: "KPAFRELGTCHN", "LAMBDASER", "KAPLAMBRATIO" (kappa/lambda light chains)  No results found for: "HGBA", "HGBA2QUANT", "HGBFQUANT", "HGBSQUAN" (Hemoglobinopathy evaluation)   Lab Results  Component Value Date   LDH 195 08/21/2012    Lab Results  Component Value Date   IRON 98 07/22/2023   TIBC 547 (H) 07/22/2023   IRONPCTSAT 18 07/22/2023   (Iron and TIBC)  Lab Results  Component Value Date   FERRITIN 18 04/11/2016    Urinalysis    Component Value Date/Time   COLORURINE YELLOW 06/28/2022 1122   APPEARANCEUR CLEAR 06/28/2022 1122   LABSPEC 1.015 06/28/2022 1122   LABSPEC 1.010 04/29/2016 1118   PHURINE 6.0 06/28/2022 1122   GLUCOSEU NEGATIVE 06/28/2022 1122   GLUCOSEU Negative 04/29/2016 1118   HGBUR NEGATIVE 06/28/2022 1122   BILIRUBINUR NEGATIVE 06/28/2022 1122   BILIRUBINUR Negative 04/29/2016 1118   KETONESUR NEGATIVE 06/28/2022 1122   PROTEINUR NEGATIVE 06/28/2022 1122   UROBILINOGEN 0.2 04/29/2016 1118   NITRITE NEGATIVE 06/28/2022 1122   LEUKOCYTESUR NEGATIVE 06/28/2022 1122   LEUKOCYTESUR Trace 04/29/2016 1118   CBC from today shows significant improvement in hemoglobin.  White blood cell count is 3100, total neutrophil  count is 2500, no thrombocytopenia  STUDIES: No results found.  COMPARISON: Abdominal ultrasound November 05, 2021   TECHNIQUE:  Real time limited abdomen ultrasound was performed.   IMAGE QUALITY: Satisfactory, of diagnostic value.   FINDINGS:   Limited sonographic views of the abdomen demonstrates mild abdominal ascites. There is no large pocket to safely drain.    IMPRESSION:  IMPRESSION:  1. Mild abdominal ascites.  2. No large enough pocket to safely drain.    ASSESSMENT: 67 y.o. [place] woman presenting June  2011 with stage IV breast cancer involving the right breast upper outer quadrant, right axilla, mediastinal lymph nodes, and both lungs, but not the brain, liver, or bones  (1) positive right breast and right axillary lymph node biopsies 07/17/2010 of a clinical T4 N2 M1 invasive ductal carcinoma, grade 1, estrogen receptor 100% positive, progesterone receptor 6% positive, with an MIB-1 of 12%, and no HER-2 amplification.  (2) participated in Phase II tessetaxel study, receiving 2 cycles complicated by thrombocytopenia, anemia requiring transfusion, transaminase elevation, and afebrile neutropenia. Off-study as of September 2011. Chest CT scan September 2011 did show evidence of response.  (3) on letrozole as of October 2011, with continuing response and good tolerance   (4) 06/02/2012 underwent right mastectomy and axillary lymph node sampling (3 lymph nodes removed, all with viable cancer as well as evidence of treatment effect) for a ypT2 ypN1-2 invasive ductal carcinoma, grade 1, 98% estrogen receptor positive, progesterone receptor negative, with no HER-2 amplification  (5) left jugular vein DVT documented 06/16/2012; left sided Port-A-Cath removed mid July 2015; Coumadin stopped 09/07/2014.  (a) Left brachiocephalic v. slightly narrowed, with some Left lower neck collateralization noted on chest CT December 2015  (6) osteoporosis, was on alendronate, switched to denosumab/Prolia 11/07/2016  (a) bone density in 10/2018 shows osteopenia T score of -2.2  (7) portal hypertension, with splenomegaly, gastric varices, and intestinal edema  (a)  mild persistent leukopenia and thrombocytopenia likely due to splenomegaly  (b) iron deficiency secondary to above, status post Feraheme x2 December 2018  (8) restaging studies:  (a) chest CT 12/26/2017 shows a 4.5 mm left lower lobe pulmonary nodule which is unchanged.  There was no other evidence of active disease  (b) restaging chest CT in 05/2019 shows  no change in bilateral pulmonary nodules, prominent mediastinal lymph nodes are stable  (9) CT scan of the abdomen 11/03/2021 shows thrombus in the portal, splenic and superior mesenteric vein  (A) Heparin started 11/03/2021  10. PET scan 05/24/2022, low metabolic activity of right lower pulmonary nodule. No change in size of CT 02/27/2022, Carcinoma with mucinous features consistent with metastatic breast carcinoma, negative margins, no LN involvement.  Prognostics showed ER +100% strong staining, PR +20% staining, Ki-67 of 10% and HER2 2+ by IHC and negative by FISH  PLAN:  Given metastatic breast cancer progression on letrozole,she is now on Faslodex with Ibrance.   She is currently on this combination and is tolerating it very well.   She has baseline thrombocytopenia due to splenomegaly, this has gotten worse, hence we changed the Ibrance to 75 mg po daily.  She is tolerating this dose very well.  No evidence of thrombocytopenia on labs today.  With regards to her anemia, this is likely from GI blood loss and this has improved since her last visit.  I have asked them to add an iron panel and ferritin to see if she will benefit from iron infusions.  She otherwise will proceed with Faslodex as  scheduled.  She also continues on Prolia every 6 months for osteoporosis, last bone density in March 2023 with a T score of -2.4 improved.   There are no [name] concerns on review of systems or physical exam except for ongoing fatigue and some mild shortness of breath.  I recommended that she follow-up with Dr. Charyl [name] as recommended. At this time, I am not very worried about disease progression, PET has been ordered for September.  Total time spent: 30 min  *Total Encounter Time as defined by the Centers for Medicare and Medicaid Services includes, in addition to the face-to-face time of a patient visit (documented in the note above) non-face-to-face time: obtaining and reviewing outside history, ordering and  reviewing medications, tests or procedures, care coordination (communications with other health care professionals or caregivers) and documentation in the medical record.

## 2023-07-23 ENCOUNTER — Other Ambulatory Visit: Payer: Self-pay | Admitting: Hematology and Oncology

## 2023-07-23 ENCOUNTER — Telehealth: Payer: Self-pay | Admitting: Hematology and Oncology

## 2023-07-23 NOTE — Progress Notes (Unsigned)
Venofer plan placed Message sent to schedulers about scheduling  Nicole [name]

## 2023-07-23 NOTE — Telephone Encounter (Signed)
Patient is aware of upcoming appointment times/dates.  

## 2023-07-25 ENCOUNTER — Telehealth: Payer: Self-pay | Admitting: Hematology and Oncology

## 2023-07-25 NOTE — Telephone Encounter (Signed)
Patient is aware of upcoming appointments and updated times for new appointments

## 2023-08-08 ENCOUNTER — Inpatient Hospital Stay: Payer: Medicare Other | Attending: Hematology and Oncology

## 2023-08-08 DIAGNOSIS — Z5111 Encounter for antineoplastic chemotherapy: Secondary | ICD-10-CM | POA: Insufficient documentation

## 2023-08-08 DIAGNOSIS — D649 Anemia, unspecified: Secondary | ICD-10-CM | POA: Insufficient documentation

## 2023-08-08 DIAGNOSIS — Z170 Estrogen receptor positive status [ER+]: Secondary | ICD-10-CM | POA: Insufficient documentation

## 2023-08-08 DIAGNOSIS — C50411 Malignant neoplasm of upper-outer quadrant of right female breast: Secondary | ICD-10-CM | POA: Insufficient documentation

## 2023-08-12 ENCOUNTER — Inpatient Hospital Stay: Payer: Medicare Other

## 2023-08-15 ENCOUNTER — Inpatient Hospital Stay: Payer: Medicare Other

## 2023-08-15 VITALS — BP 119/69 | HR 73 | Temp 97.8°F | Resp 16

## 2023-08-15 DIAGNOSIS — D649 Anemia, unspecified: Secondary | ICD-10-CM | POA: Diagnosis not present

## 2023-08-15 DIAGNOSIS — Z5111 Encounter for antineoplastic chemotherapy: Secondary | ICD-10-CM | POA: Diagnosis present

## 2023-08-15 DIAGNOSIS — M810 Age-related osteoporosis without current pathological fracture: Secondary | ICD-10-CM

## 2023-08-15 DIAGNOSIS — C50411 Malignant neoplasm of upper-outer quadrant of right female breast: Secondary | ICD-10-CM | POA: Diagnosis not present

## 2023-08-15 DIAGNOSIS — Z170 Estrogen receptor positive status [ER+]: Secondary | ICD-10-CM | POA: Diagnosis not present

## 2023-08-15 MED ORDER — SODIUM CHLORIDE 0.9 % IV SOLN
300.0000 mg | Freq: Once | INTRAVENOUS | Status: AC
  Administered 2023-08-15: 300 mg via INTRAVENOUS
  Filled 2023-08-15: qty 300

## 2023-08-15 MED ORDER — SODIUM CHLORIDE 0.9 % IV SOLN: Freq: Once | INTRAVENOUS | Status: AC

## 2023-08-15 NOTE — Progress Notes (Signed)
Pt observed for 30 min post infusion. Tolerated well with no adverse s/s. VSS at time of discharge. Pt ambulated independently to lobby for d/c. AVS reviewed.

## 2023-08-15 NOTE — Patient Instructions (Signed)
 Iron Sucrose Injection What is this medication? IRON SUCROSE (EYE ern SOO krose) treats low levels of iron (iron deficiency anemia) in people with kidney disease. Iron is a mineral that plays an important role in making red blood cells, which carry oxygen from your lungs to the rest of your body. This medicine may be used for other purposes; ask your health care provider or pharmacist if you have questions. COMMON BRAND NAME(S): Venofer What should I tell my care team before I take this medication? They need to know if you have any of these conditions: Anemia not caused by low iron levels Heart disease High levels of iron in the blood Kidney disease Liver disease An unusual or allergic reaction to iron, other medications, foods, dyes, or preservatives Pregnant or trying to get pregnant Breastfeeding How should I use this medication? This medication is for infusion into a vein. It is given in a hospital or clinic setting. Talk to your care team about the use of this medication in children. While this medication may be prescribed for children as young as 2 years for selected conditions, precautions do apply. Overdosage: If you think you have taken too much of this medicine contact a poison control center or emergency room at once. NOTE: This medicine is only for you. Do not share this medicine with others. What if I miss a dose? Keep appointments for follow-up doses. It is important not to miss your dose. Call your care team if you are unable to keep an appointment. What may interact with this medication? Do not take this medication with any of the following: Deferoxamine Dimercaprol Other iron products This medication may also interact with the following: Chloramphenicol Deferasirox This list may not describe all possible interactions. Give your health care provider a list of all the medicines, herbs, non-prescription drugs, or dietary supplements you use. Also tell them if you smoke,  drink alcohol, or use illegal drugs. Some items may interact with your medicine. What should I watch for while using this medication? Visit your care team regularly. Tell your care team if your symptoms do not start to get better or if they get worse. You may need blood work done while you are taking this medication. You may need to follow a special diet. Talk to your care team. Foods that contain iron include: whole grains/cereals, dried fruits, beans, or peas, leafy green vegetables, and organ meats (liver, kidney). What side effects may I notice from receiving this medication? Side effects that you should report to your care team as soon as possible: Allergic reactions--skin rash, itching, hives, swelling of the face, lips, tongue, or throat Low blood pressure--dizziness, feeling faint or lightheaded, blurry vision Shortness of breath Side effects that usually do not require medical attention (report to your care team if they continue or are bothersome): Flushing Headache Joint pain Muscle pain Nausea Pain, redness, or irritation at injection site This list may not describe all possible side effects. Call your doctor for medical advice about side effects. You may report side effects to FDA at 1-800-FDA-1088. Where should I keep my medication? This medication is given in a hospital or clinic. It will not be stored at home. NOTE: This sheet is a summary. It may not cover all possible information. If you have questions about this medicine, talk to your doctor, pharmacist, or health care provider.  2024 Elsevier/Gold Standard (2023-05-23 00:00:00)

## 2023-08-19 ENCOUNTER — Telehealth: Payer: Self-pay | Admitting: Adult Health

## 2023-08-19 ENCOUNTER — Other Ambulatory Visit: Payer: Self-pay | Admitting: Adult Health

## 2023-08-19 ENCOUNTER — Inpatient Hospital Stay: Payer: Medicare Other

## 2023-08-19 ENCOUNTER — Inpatient Hospital Stay (HOSPITAL_BASED_OUTPATIENT_CLINIC_OR_DEPARTMENT_OTHER): Payer: Medicare Other | Admitting: Adult Health

## 2023-08-19 ENCOUNTER — Encounter: Payer: Self-pay | Admitting: Adult Health

## 2023-08-19 VITALS — BP 122/60 | HR 81 | Temp 97.5°F | Resp 18 | Ht 64.0 in | Wt 128.5 lb

## 2023-08-19 DIAGNOSIS — C50911 Malignant neoplasm of unspecified site of right female breast: Secondary | ICD-10-CM

## 2023-08-19 DIAGNOSIS — C7800 Secondary malignant neoplasm of unspecified lung: Secondary | ICD-10-CM

## 2023-08-19 DIAGNOSIS — Z170 Estrogen receptor positive status [ER+]: Secondary | ICD-10-CM | POA: Diagnosis not present

## 2023-08-19 DIAGNOSIS — M810 Age-related osteoporosis without current pathological fracture: Secondary | ICD-10-CM

## 2023-08-19 DIAGNOSIS — C50411 Malignant neoplasm of upper-outer quadrant of right female breast: Secondary | ICD-10-CM

## 2023-08-19 DIAGNOSIS — D500 Iron deficiency anemia secondary to blood loss (chronic): Secondary | ICD-10-CM

## 2023-08-19 DIAGNOSIS — D509 Iron deficiency anemia, unspecified: Secondary | ICD-10-CM

## 2023-08-19 DIAGNOSIS — D508 Other iron deficiency anemias: Secondary | ICD-10-CM

## 2023-08-19 DIAGNOSIS — Z5111 Encounter for antineoplastic chemotherapy: Secondary | ICD-10-CM | POA: Diagnosis not present

## 2023-08-19 LAB — COMPREHENSIVE METABOLIC PANEL
ALT: 14 U/L (ref 0–44)
AST: 18 U/L (ref 15–41)
Albumin: 3.9 g/dL (ref 3.5–5.0)
Alkaline Phosphatase: 65 U/L (ref 38–126)
Anion gap: 9 (ref 5–15)
BUN: 19 mg/dL (ref 8–23)
CO2: 23 mmol/L (ref 22–32)
Calcium: 9.1 mg/dL (ref 8.9–10.3)
Chloride: 108 mmol/L (ref 98–111)
Creatinine, Ser: 1.24 mg/dL — ABNORMAL HIGH (ref 0.44–1.00)
GFR, Estimated: 47 mL/min — ABNORMAL LOW (ref >60)
Glucose, Bld: 104 mg/dL — ABNORMAL HIGH (ref 70–99)
Potassium: 5 mmol/L (ref 3.5–5.1)
Sodium: 140 mmol/L (ref 135–145)
Total Bilirubin: 0.5 mg/dL (ref 0.3–1.2)
Total Protein: 6.8 g/dL (ref 6.5–8.1)

## 2023-08-19 LAB — CBC WITH DIFFERENTIAL/PLATELET
Abs Immature Granulocytes: 0.03 10*3/uL (ref 0.00–0.07)
Basophils Absolute: 0 10*3/uL (ref 0.0–0.1)
Basophils Relative: 1 %
Eosinophils Absolute: 0.1 10*3/uL (ref 0.0–0.5)
Eosinophils Relative: 3 %
HCT: 24.6 % — ABNORMAL LOW (ref 36.0–46.0)
Hemoglobin: 7.7 g/dL — ABNORMAL LOW (ref 12.0–15.0)
Immature Granulocytes: 1 %
Lymphocytes Relative: 11 %
Lymphs Abs: 0.3 10*3/uL — ABNORMAL LOW (ref 0.7–4.0)
MCH: 31.3 pg (ref 26.0–34.0)
MCHC: 31.3 g/dL (ref 30.0–36.0)
MCV: 100 fL (ref 80.0–100.0)
Monocytes Absolute: 0.2 10*3/uL (ref 0.1–1.0)
Monocytes Relative: 6 %
Neutro Abs: 2.4 10*3/uL (ref 1.7–7.7)
Neutrophils Relative %: 78 %
Platelets: 139 10*3/uL — ABNORMAL LOW (ref 150–400)
RBC: 2.46 MIL/uL — ABNORMAL LOW (ref 3.87–5.11)
RDW: 19 % — ABNORMAL HIGH (ref 11.5–15.5)
WBC: 3.1 10*3/uL — ABNORMAL LOW (ref 4.0–10.5)
nRBC: 0 % (ref 0.0–0.2)

## 2023-08-19 LAB — SAMPLE TO BLOOD BANK

## 2023-08-19 MED ORDER — FULVESTRANT 250 MG/5ML IM SOSY
500.0000 mg | PREFILLED_SYRINGE | Freq: Once | INTRAMUSCULAR | Status: AC
  Administered 2023-08-19: 500 mg via INTRAMUSCULAR
  Filled 2023-08-19: qty 10

## 2023-08-19 NOTE — Progress Notes (Signed)
[facility] Cancer Follow up:    Nicole Moulds, MD [address] [place] [address]   DIAGNOSIS:  Cancer Staging  Breast cancer metastasized to lung [facility]) Staging form: Breast, AJCC 7th Edition - Clinical: Stage IV (TX, NX, M1) - Signed by Lowella Dell, MD on 11/01/2015  Malignant neoplasm of upper-outer quadrant of right breast in female, estrogen receptor positive (HCC) Staging form: Breast, AJCC 7th Edition - Clinical: Stage IV (TX, NX, M1) - Signed by Lowella Dell, MD on 11/01/2015   SUMMARY OF ONCOLOGIC HISTORY: [name] woman presenting June 2011 with stage IV breast cancer involving the right breast upper outer quadrant, right axilla, mediastinal lymph nodes, and both lungs, but not the brain, liver, or bones   (1) positive right breast and right axillary lymph node biopsies 07/17/2010 of a clinical T4 N2 M1 invasive ductal carcinoma, grade 1, estrogen receptor 100% positive, progesterone receptor 6% positive, with an MIB-1 of 12%, and no HER-2 amplification.   (2) participated in Phase II tessetaxel study, receiving 2 cycles complicated by thrombocytopenia, anemia requiring transfusion, transaminase elevation, and afebrile neutropenia. Off-study as of September 2011. Chest CT scan September 2011 did show evidence of response.   (3) on letrozole as of October 2011, with continuing response and good tolerance    (4) 06/02/2012 underwent right mastectomy and axillary lymph node sampling (3 lymph nodes removed, all with viable cancer as well as evidence of treatment effect) for a ypT2 ypN1-2 invasive ductal carcinoma, grade 1, 98% estrogen receptor positive, progesterone receptor negative, with no HER-2 amplification   (5) left jugular vein DVT documented 06/16/2012; left sided Port-A-Cath removed mid July 2015; Coumadin stopped 09/07/2014.             (a) Left brachiocephalic v. slightly narrowed, with some Left lower neck collateralization noted on  chest CT December 2015   (6) osteoporosis, was on alendronate, switched to denosumab/Prolia 11/07/2016             (a) bone density in 10/2018 shows osteopenia T score of -2.2   (7) portal hypertension, with splenomegaly, gastric varices, and intestinal edema             (a)  mild persistent leukopenia and thrombocytopenia likely due to splenomegaly             (b) iron deficiency secondary to above, status post Feraheme x2 December 2018   (8) restaging studies:             (a) chest CT 12/26/2017 shows a 4.5 mm left lower lobe pulmonary nodule which is unchanged.  There was no other evidence of active disease             (b) restaging chest CT in 05/2019 shows no change in bilateral pulmonary nodules, prominent mediastinal lymph nodes are stable   (9) CT scan of the abdomen 11/03/2021 shows thrombus in the portal, splenic and superior mesenteric vein             (A) Heparin started 11/03/2021   10. PET scan 05/24/2022, low metabolic activity of right lower pulmonary nodule. No change in size of CT 02/27/2022,Carcinoma with mucinous features consistent with metastatic breast carcinoma, negative margins, no LN involvement.  Prognostics showed ER +100% strong staining, PR +20% staining, Ki-67 of 10% and HER2 2+ by IHC and negative by FISH  CURRENT THERAPY: Faslodex, Ilda Foil, Prolia  INTERVAL HISTORY: Nicole [name] 67 y.o. female returns for follow up prior to  receiving Faslodex.  She is taking Ibrance 3 weeks on and one week off.    She has history of Gastric antral vascular ectasia and underwent radiofrequency ablation of this last week.  She received IV iron last week and is scheduled again on 8/26.  She endorses increased fatigue and shortness of breath on exertion.    Patient Active Problem List   Diagnosis Date Noted   Lung nodule 07/01/2022   S/P partial lobectomy of lung 07/01/2022   Portal vein thrombosis 02/15/2022   Hypercoagulable state (HCC) 02/15/2022   Acute occlusion of  mesenteric vein (HCC) 02/15/2022   Goals of care, counseling/discussion 11/17/2017   Hematemesis 09/04/2017   Melena 09/04/2017   Thrombocytopenia (HCC) 09/04/2017   GI bleed 09/04/2017   Idiopathic cirrhosis (HCC)    Portal hypertension (HCC)    Iron deficiency anemia 05/19/2017   Osteoporosis 05/09/2016   Depression 04/30/2016   UTI (urinary tract infection) 04/30/2016   Acute upper GI bleed 04/11/2016   Esophageal varices (HCC) 04/11/2016   Breast cancer metastasized to lung (HCC) 03/14/2014   Malignant neoplasm of upper-outer quadrant of right breast in female, estrogen receptor positive (HCC) 09/30/2013   Lymphedema of arm 06/14/2013   DVT (deep venous thrombosis) (HCC) 12/15/2012    has No Known Allergies.  MEDICAL HISTORY: Past Medical History:  Diagnosis Date   Anemia    low iron   Arthritis    fingers   Blood transfusion 2012   Breast CA (HCC)    breast ca dx 06/2010, stage 4, right , Lung Metasis.Surgery, Chemo, Radiation   Breast cancer (HCC)    Clot    jugular  ?2015   Depression    Esophageal varices (HCC)    Headache    Mirgraine- rare since menopause   Heart murmur    mild, no cardiologist, from birth   Hematemesis 08/2017   History of kidney stones    no pain- shows in CT- small 1 mm bilaterally- kidney   Hypertension    IBD (inflammatory bowel disease)    resolved   Idiopathic cirrhosis (HCC)    pt denies   Peripheral vascular disease (HCC) few yrs ago   clot in right jugular   Portal hypertension (HCC)    Radiation 07/21/12-09/07/12   5940 cGy   Restless legs     SURGICAL HISTORY: Past Surgical History:  Procedure Laterality Date   BIOPSY  08/21/2020   Procedure: BIOPSY;  Surgeon: Kerin Salen, MD;  Location: [facility] ENDOSCOPY;  Service: Gastroenterology;;   COLONOSCOPY  2016   DILATION AND CURETTAGE OF UTERUS  2004?   ESOPHAGEAL BANDING  09/04/2017   Procedure: ESOPHAGEAL BANDING;  Surgeon: Kerin Salen, MD;  Location: [facility] ENDOSCOPY;  Service:  Gastroenterology;;   ESOPHAGEAL BANDING  11/27/2017   Procedure: ESOPHAGEAL BANDING;  Surgeon: Kerin Salen, MD;  Location: [facility] ENDOSCOPY;  Service: Gastroenterology;;   ESOPHAGEAL BANDING N/A 04/28/2018   Procedure: ESOPHAGEAL BANDING;  Surgeon: Kerin Salen, MD;  Location: [facility] ENDOSCOPY;  Service: Gastroenterology;  Laterality: N/A;   ESOPHAGEAL BANDING  11/17/2018   Procedure: ESOPHAGEAL BANDING;  Surgeon: Kerin Salen, MD;  Location: [facility] ENDOSCOPY;  Service: Gastroenterology;;   ESOPHAGEAL BANDING  08/16/2019   Procedure: ESOPHAGEAL BANDING;  Surgeon: Kerin Salen, MD;  Location: [facility] ENDOSCOPY;  Service: Gastroenterology;;   ESOPHAGEAL BANDING N/A 08/21/2020   Procedure: ESOPHAGEAL BANDING;  Surgeon: Kerin Salen, MD;  Location: [facility] ENDOSCOPY;  Service: Gastroenterology;  Laterality: N/A;   ESOPHAGOGASTRODUODENOSCOPY N/A 04/28/2018   Procedure: ESOPHAGOGASTRODUODENOSCOPY (  EGD);  Surgeon: Kerin Salen, MD;  Location: [facility] ENDOSCOPY;  Service: Gastroenterology;  Laterality: N/A;   ESOPHAGOGASTRODUODENOSCOPY N/A 11/17/2018   Procedure: ESOPHAGOGASTRODUODENOSCOPY (EGD);  Surgeon: Kerin Salen, MD;  Location: [facility] ENDOSCOPY;  Service: Gastroenterology;  Laterality: N/A;   ESOPHAGOGASTRODUODENOSCOPY N/A 08/13/2022   Procedure: ESOPHAGOGASTRODUODENOSCOPY (EGD);  Surgeon: Kerin Salen, MD;  Location: [facility] ENDOSCOPY;  Service: Gastroenterology;  Laterality: N/A;   ESOPHAGOGASTRODUODENOSCOPY (EGD) WITH PROPOFOL N/A 04/11/2016   Procedure: ESOPHAGOGASTRODUODENOSCOPY (EGD) WITH PROPOFOL;  Surgeon: Vida Rigger, MD;  Location: [facility] ENDOSCOPY;  Service: Endoscopy;  Laterality: N/A;   ESOPHAGOGASTRODUODENOSCOPY (EGD) WITH PROPOFOL N/A 07/08/2016   Procedure: ESOPHAGOGASTRODUODENOSCOPY (EGD) WITH PROPOFOL;  Surgeon: Carman Ching, MD;  Location: [facility] ENDOSCOPY;  Service: Endoscopy;  Laterality: N/A;   ESOPHAGOGASTRODUODENOSCOPY (EGD) WITH PROPOFOL N/A 03/03/2017   Procedure: ESOPHAGOGASTRODUODENOSCOPY (EGD) WITH PROPOFOL;  Surgeon: Carman Ching, MD;  Location: [facility] ENDOSCOPY;  Service: Endoscopy;  Laterality: N/A;   ESOPHAGOGASTRODUODENOSCOPY (EGD) WITH PROPOFOL N/A 09/04/2017   Procedure: ESOPHAGOGASTRODUODENOSCOPY (EGD) WITH PROPOFOL;  Surgeon: Kerin Salen, MD;  Location: [facility] ENDOSCOPY;  Service: Gastroenterology;  Laterality: N/A;   ESOPHAGOGASTRODUODENOSCOPY (EGD) WITH PROPOFOL N/A 11/27/2017   Procedure: ESOPHAGOGASTRODUODENOSCOPY (EGD);  Surgeon: Kerin Salen, MD;  Location: [facility] ENDOSCOPY;  Service: Gastroenterology;  Laterality: N/A;   ESOPHAGOGASTRODUODENOSCOPY (EGD) WITH PROPOFOL N/A 08/16/2019   Procedure: ESOPHAGOGASTRODUODENOSCOPY (EGD) WITH PROPOFOL;  Surgeon: Kerin Salen, MD;  Location: [facility] ENDOS[facility];  Service: Gastroenterology;  Laterality: N/A;   ESOPHAGOGASTRODUODENOSCOPY (EGD) WITH PROPOFOL N/A 08/21/2020   Procedure: ESOPHAGOGASTRODUODENOSCOPY (EGD) WITH PROPOFOL;  Surgeon: Kerin Salen, MD;  Location: [facility] ENDOSCOPY;  Service: Gastroenterology;  Laterality: N/A;   ESOPHAGOGASTRODUODENOSCOPY (EGD) WITH PROPOFOL N/A 08/24/2021   Procedure: ESOPHAGOGASTRODUODENOSCOPY (EGD) WITH PROPOFOL;  Surgeon: Kerin Salen, MD;  Location: [facility] ENDOSCOPY;  Service: Gastroenterology;  Laterality: N/A;   ESOPHAGOGASTRODUODENOSCOPY (EGD) WITH PROPOFOL N/A 06/25/2023   Procedure: ESOPHAGOGASTRODUODENOSCOPY (EGD) WITH PROPOFOL;  Surgeon: Vida Rigger, MD;  Location: [facility] ENDOSCOPY;  Service: Gastroenterology;  Laterality: N/A;  W. bandging   GASTRIC VARICES BANDING N/A 08/24/2021   Procedure: GASTRIC VARICES BANDING;  Surgeon: Kerin Salen, MD;  Location: [facility] ENDOSCOPY;  Service: Gastroenterology;  Laterality: N/A;   GASTRIC VARICES BANDING N/A 08/13/2022   Procedure: GASTRIC VARICES BANDING;  Surgeon: Kerin Salen, MD;  Location: [facility] ENDOSCOPY;  Service: Gastroenterology;  Laterality: N/A;   HEMOSTASIS CLIP PLACEMENT  08/21/2020   Procedure: HEMOSTASIS CLIP PLACEMENT;  Surgeon: Kerin Salen, MD;  Location: [facility] ENDOSCOPY;  Service: Gastroenterology;;    INTERCOSTAL NERVE BLOCK Left 07/01/2022   Procedure: INTERCOSTAL NERVE BLOCK;  Surgeon: Loreli Slot, MD;  Location: [facility] OR;  Service: Thoracic;  Laterality: Left;   IR GENERIC HISTORICAL  05/15/2016   IR RADIOLOGIST EVAL & MGMT 05/15/2016 Gilmer Mor, DO [facility] INTERV RAD   IR GENERIC HISTORICAL  01/14/2017   IR US GUIDE VASC ACCESS RIGHT 01/14/2017 Berdine Dance, MD [facility]   IR GENERIC HISTORICAL  01/14/2017   IR VENOGRAM HEPATIC W HEMODYNAMIC EVALUATION 01/14/2017 Berdine Dance, MD [facility]   IR RADIOLOGIST EVAL & MGMT  06/12/2017   IR RADIOLOGIST EVAL & MGMT  08/11/2018   IR RADIOLOGIST EVAL & MGMT  08/25/2019   IR RADIOLOGIST EVAL & MGMT  08/22/2020   IR RADIOLOGIST EVAL & MGMT  03/11/2022   MASTECTOMY Right 06/02/12   right breast with lymph node removal   NODE DISSECTION Left 07/01/2022   Procedure: NODE DISSECTION;  Surgeon: Loreli Slot, MD;  Location: [facility] OR;  Service: Thoracic;  Laterality: Left;   PORT-A-CATH  REMOVAL     PORTACATH PLACEMENT  2011   left side   RADIOFREQUENCY ABLATION  06/25/2023   Procedure: RADIO FREQUENCY ABLATION;  Surgeon: Vida Rigger, MD;  Location: [facility] ENDOSCOPY;  Service: Gastroenterology;;   RADIOLOGY WITH ANESTHESIA N/A 04/13/2016   Procedure: RADIOLOGY WITH ANESTHESIA;  Surgeon: Gilmer Mor, DO;  Location: [facility] OR;  Service: Anesthesiology;  Laterality: N/A;   VIDEO BRONCHOSCOPY WITH ENDOBRONCHIAL NAVIGATION N/A 07/01/2022   Procedure: VIDEO BRONCHOSCOPY WITH ENDOBRONCHIAL NAVIGATION;  Surgeon: Loreli Slot, MD;  Location: [facility] OR;  Service: Thoracic;  Laterality: N/A;   XI ROBOTIC ASSISTED THORACOSCOPY- SEGMENTECTOMY Left 07/01/2022   Procedure: XI ROBOTIC ASSISTED THORACOSCOPY-LEFT LOWER LOBE BASILAR SEGMENTECTOMY;  Surgeon: Loreli Slot, MD;  Location: [facility] OR;  Service: Thoracic;  Laterality: Left;    SOCIAL HISTORY: Social History   Socioeconomic History   Marital status: Married    Spouse name: Not on file   Number of  children: Not on file   Years of education: Not on file   Highest education level: Not on file  Occupational History   Not on file  Tobacco Use   Smoking status: Never   Smokeless tobacco: Never  Vaping Use   Vaping status: Never Used  Substance and Sexual Activity   Alcohol use: No   Drug use: No   Sexual activity: Yes  Other Topics Concern   Not on file  Social History Narrative   Not on file   Social Determinants of Health   Financial Resource Strain: Not on file  Food Insecurity: Not on file  Transportation Needs: Not on file  Physical Activity: Not on file  Stress: Not on file  Social Connections: Not on file  Intimate Partner Violence: Not on file    FAMILY HISTORY: Family History  Problem Relation Age of Onset   COPD Mother    AAA (abdominal aortic aneurysm) Mother    COPD Father    Cancer Neg Hx     Review of Systems  Constitutional:  Positive for fatigue. Negative for appetite change, chills, fever and unexpected weight change.  HENT:   Negative for hearing loss, lump/mass and trouble swallowing.   Eyes:  Negative for eye problems and icterus.  Respiratory:  Positive for shortness of breath. Negative for chest tightness and cough.   Cardiovascular:  Negative for chest pain, leg swelling and palpitations.  Gastrointestinal:  Negative for abdominal distention, abdominal pain, constipation, diarrhea, nausea and vomiting.  Endocrine: Negative for hot flashes.  Genitourinary:  Negative for difficulty urinating.   Musculoskeletal:  Negative for arthralgias.  Skin:  Negative for itching and rash.  Neurological:  Negative for dizziness, extremity weakness, headaches and numbness.  Hematological:  Negative for adenopathy. Does not bruise/bleed easily.  Psychiatric/Behavioral:  Negative for depression. The patient is not nervous/anxious.       PHYSICAL EXAMINATION     Vitals:   08/19/23 1416  BP: 122/60  Pulse: 81  Resp: 18  Temp: (!) 97.5 F (36.4 C)   SpO2: 100%    Physical Exam Constitutional:      General: She is not in acute distress.    Appearance: Normal appearance. She is not toxic-appearing.  HENT:     Head: Normocephalic and atraumatic.     Mouth/Throat:     Mouth: Mucous membranes are moist.     Pharynx: Oropharynx is clear. No oropharyngeal exudate or posterior oropharyngeal erythema.  Eyes:     General: No scleral icterus. Cardiovascular:  Rate and Rhythm: Normal rate and regular rhythm.     Pulses: Normal pulses.     Heart sounds: Normal heart sounds.  Pulmonary:     Effort: Pulmonary effort is normal.     Breath sounds: Normal breath sounds.  Abdominal:     General: Abdomen is flat. Bowel sounds are normal. There is no distension.     Palpations: Abdomen is soft.     Tenderness: There is no abdominal tenderness.  Musculoskeletal:        General: No swelling.     Cervical back: Neck supple.  Lymphadenopathy:     Cervical: No cervical adenopathy.  Skin:    General: Skin is warm and dry.     Findings: No rash.  Neurological:     General: No focal deficit present.     Mental Status: She is alert.  Psychiatric:        Mood and Affect: Mood normal.        Behavior: Behavior normal.     LABORATORY DATA:  CBC    Component Value Date/Time   WBC 3.1 (L) 08/19/2023 1338   RBC 2.46 (L) 08/19/2023 1338   HGB 7.7 (L) 08/19/2023 1338   HGB 8.9 (L) 06/24/2023 1322   HGB 12.6 11/17/2017 1355   HCT 24.6 (L) 08/19/2023 1338   HCT 39.6 11/17/2017 1355   PLT 139 (L) 08/19/2023 1338   PLT 173 06/24/2023 1322   PLT 121 (L) 11/17/2017 1355   MCV 100.0 08/19/2023 1338   MCV 79.1 (L) 11/17/2017 1355   MCH 31.3 08/19/2023 1338   MCHC 31.3 08/19/2023 1338   RDW 19.0 (H) 08/19/2023 1338   RDW 18.6 (H) 11/17/2017 1355   LYMPHSABS 0.3 (L) 08/19/2023 1338   LYMPHSABS 1.0 11/17/2017 1355   MONOABS 0.2 08/19/2023 1338   MONOABS 0.4 11/17/2017 1355   EOSABS 0.1 08/19/2023 1338   EOSABS 0.0 11/17/2017 1355    BASOSABS 0.0 08/19/2023 1338   BASOSABS 0.0 11/17/2017 1355    CMP     Component Value Date/Time   NA 140 08/19/2023 1338   NA 142 11/17/2017 1355   K 5.0 08/19/2023 1338   K 4.4 11/17/2017 1355   CL 108 08/19/2023 1338   CL 109 (H) 04/20/2013 0859   CO2 23 08/19/2023 1338   CO2 22 11/17/2017 1355   GLUCOSE 104 (H) 08/19/2023 1338   GLUCOSE 86 11/17/2017 1355   GLUCOSE 91 04/20/2013 0859   BUN 19 08/19/2023 1338   BUN 12.5 11/17/2017 1355   CREATININE 1.24 (H) 08/19/2023 1338   CREATININE 1.07 (H) 09/23/2022 1327   CREATININE 1.1 11/17/2017 1355   CALCIUM 9.1 08/19/2023 1338   CALCIUM 9.2 11/17/2017 1355   PROT 6.8 08/19/2023 1338   PROT 7.0 11/17/2017 1355   ALBUMIN 3.9 08/19/2023 1338   ALBUMIN 3.8 11/17/2017 1355   AST 18 08/19/2023 1338   AST 14 (L) 09/23/2022 1327   AST 23 11/17/2017 1355   ALT 14 08/19/2023 1338   ALT 11 09/23/2022 1327   ALT 22 11/17/2017 1355   ALKPHOS 65 08/19/2023 1338   ALKPHOS 55 11/17/2017 1355   BILITOT 0.5 08/19/2023 1338   BILITOT 0.7 09/23/2022 1327   BILITOT 0.49 11/17/2017 1355   GFRNONAA 47 (L) 08/19/2023 1338   GFRNONAA 57 (L) 09/23/2022 1327   GFRNONAA 53 (L) 05/09/2016 1104   GFRAA 53 (L) 06/12/2020 1146   GFRAA 55 (L) 12/13/2019 1200   GFRAA 61 05/09/2016 1104  ASSESSMENT and THERAPY PLAN:   Breast cancer metastasized to lung [facility]) Nicole [name] is a 67 year old woman with metastatic breast cancer here today for labs, follow-up, and her injection and she is on treatment with fulvestrant/Faslodex and palbociclib/Ibrance.  Metastatic breast cancer: She has no clinical or radiographic signs of breast cancer progression.  She is tolerating her current treatment and will continue on it.  She is due for PET scan on 09/04/2023 and will see Dr. Al Pimple on 09/16/2023.   Abdominal ascites: She will undergo ultrasound-guided paracentesis on Friday.  Is following up with Dr. Pati Gallo about the bleeding varices. Symptomatic anemia: Secondary to  GAVE.  She is receiving IV iron, and will receive one unit of blood due to her profound fatigue.   Osteoporosis: she will continue on Prolia every 6 months.  She is tolerating this well.    All questions were answered. The patient knows to call the clinic with any problems, questions or concerns. We can certainly see the patient much sooner if necessary.  Total encounter time:30 minutes*in face-to-face visit time, chart review, lab review, care coordination, order entry, and documentation of the encounter time.  Lillard Anes, NP 08/24/23 8:11 PM Medical Oncology and Hematology [facility] [address] [place], [place] [address] Tel. [phone]    Fax. [phone]  *Total Encounter Time as defined by the Centers for Medicare and Medicaid Services includes, in addition to the face-to-face time of a patient visit (documented in the note above) non-face-to-face time: obtaining and reviewing outside history, ordering and reviewing medications, tests or procedures, care coordination (communications with other health care professionals or caregivers) and documentation in the medical record.

## 2023-08-19 NOTE — Telephone Encounter (Signed)
Scheduled appointment per 8/20 staff message. Left voicemail for patient.

## 2023-08-20 ENCOUNTER — Inpatient Hospital Stay: Payer: Medicare Other

## 2023-08-20 DIAGNOSIS — D500 Iron deficiency anemia secondary to blood loss (chronic): Secondary | ICD-10-CM

## 2023-08-20 DIAGNOSIS — Z5111 Encounter for antineoplastic chemotherapy: Secondary | ICD-10-CM | POA: Diagnosis not present

## 2023-08-20 LAB — PREPARE RBC (CROSSMATCH)

## 2023-08-20 MED ORDER — SODIUM CHLORIDE 0.9% IV SOLUTION
250.0000 mL | Freq: Once | INTRAVENOUS | Status: AC
  Administered 2023-08-20: 250 mL via INTRAVENOUS

## 2023-08-20 MED ORDER — ACETAMINOPHEN 325 MG PO TABS
650.0000 mg | ORAL_TABLET | Freq: Once | ORAL | Status: AC
  Administered 2023-08-20: 650 mg via ORAL
  Filled 2023-08-20: qty 2

## 2023-08-20 MED ORDER — DIPHENHYDRAMINE HCL 25 MG PO CAPS
25.0000 mg | ORAL_CAPSULE | Freq: Once | ORAL | Status: AC
  Administered 2023-08-20: 25 mg via ORAL
  Filled 2023-08-20: qty 1

## 2023-08-21 LAB — TYPE AND SCREEN
ABO/RH(D): O POS
Antibody Screen: POSITIVE
Donor AG Type: NEGATIVE
Unit division: 0

## 2023-08-21 LAB — BPAM RBC
Blood Product Expiration Date: 202409302359
ISSUE DATE / TIME: 202408211246
Unit Type and Rh: 5100

## 2023-08-24 ENCOUNTER — Encounter: Payer: Self-pay | Admitting: Hematology and Oncology

## 2023-08-24 NOTE — Assessment & Plan Note (Signed)
Nicole [name] is a 67 year old woman with metastatic breast cancer here today for labs, follow-up, and her injection and she is on treatment with fulvestrant/Faslodex and palbociclib/Ibrance.  Metastatic breast cancer: She has no clinical or radiographic signs of breast cancer progression.  She is tolerating her current treatment and will continue on it.  She is due for PET scan on 09/04/2023 and will see Dr. Al Pimple on 09/16/2023.   Abdominal ascites: She will undergo ultrasound-guided paracentesis on Friday.  Is following up with Dr. Pati Gallo about the bleeding varices. Symptomatic anemia: Secondary to GAVE.  She is receiving IV iron, and will receive one unit of blood due to her profound fatigue.   Osteoporosis: she will continue on Prolia every 6 months.  She is tolerating this well.

## 2023-08-25 ENCOUNTER — Inpatient Hospital Stay: Payer: Medicare Other

## 2023-08-25 VITALS — BP 134/84 | HR 72 | Temp 98.2°F | Resp 18

## 2023-08-25 DIAGNOSIS — Z5111 Encounter for antineoplastic chemotherapy: Secondary | ICD-10-CM | POA: Diagnosis not present

## 2023-08-25 DIAGNOSIS — M810 Age-related osteoporosis without current pathological fracture: Secondary | ICD-10-CM

## 2023-08-25 MED ORDER — SODIUM CHLORIDE 0.9 % IV SOLN: Freq: Once | INTRAVENOUS | Status: AC

## 2023-08-25 MED ORDER — SODIUM CHLORIDE 0.9 % IV SOLN
300.0000 mg | Freq: Once | INTRAVENOUS | Status: AC
  Administered 2023-08-25: 300 mg via INTRAVENOUS
  Filled 2023-08-25: qty 300

## 2023-08-28 ENCOUNTER — Other Ambulatory Visit: Payer: Self-pay | Admitting: *Deleted

## 2023-08-28 DIAGNOSIS — C50411 Malignant neoplasm of upper-outer quadrant of right female breast: Secondary | ICD-10-CM

## 2023-08-28 MED ORDER — VENLAFAXINE HCL ER 150 MG PO CP24
150.0000 mg | ORAL_CAPSULE | Freq: Every day | ORAL | 3 refills | Status: DC
Start: 2023-08-28 — End: 2024-08-23

## 2023-08-29 ENCOUNTER — Encounter: Payer: Self-pay | Admitting: Hematology and Oncology

## 2023-09-04 ENCOUNTER — Other Ambulatory Visit: Payer: Self-pay | Admitting: Hematology and Oncology

## 2023-09-04 ENCOUNTER — Ambulatory Visit (HOSPITAL_COMMUNITY)
Admission: RE | Admit: 2023-09-04 | Discharge: 2023-09-04 | Disposition: A | Discharge status: Home / Self Care | Payer: Medicare Other | Source: Ambulatory Visit | Attending: Hematology and Oncology | Admitting: Hematology and Oncology

## 2023-09-04 DIAGNOSIS — C7801 Secondary malignant neoplasm of right lung: Secondary | ICD-10-CM | POA: Insufficient documentation

## 2023-09-04 DIAGNOSIS — K922 Gastrointestinal hemorrhage, unspecified: Secondary | ICD-10-CM

## 2023-09-04 DIAGNOSIS — N200 Calculus of kidney: Secondary | ICD-10-CM | POA: Diagnosis not present

## 2023-09-04 DIAGNOSIS — I700 Atherosclerosis of aorta: Secondary | ICD-10-CM | POA: Insufficient documentation

## 2023-09-04 DIAGNOSIS — C7800 Secondary malignant neoplasm of unspecified lung: Secondary | ICD-10-CM | POA: Insufficient documentation

## 2023-09-04 DIAGNOSIS — J90 Pleural effusion, not elsewhere classified: Secondary | ICD-10-CM | POA: Insufficient documentation

## 2023-09-04 DIAGNOSIS — D500 Iron deficiency anemia secondary to blood loss (chronic): Secondary | ICD-10-CM

## 2023-09-04 DIAGNOSIS — C50911 Malignant neoplasm of unspecified site of right female breast: Secondary | ICD-10-CM | POA: Insufficient documentation

## 2023-09-04 LAB — GLUCOSE, CAPILLARY: Glucose-Capillary: 94 mg/dL (ref 70–99)

## 2023-09-04 MED ORDER — FLUDEOXYGLUCOSE F - 18 (FDG) INJECTION
6.0000 | Freq: Once | INTRAVENOUS | Status: AC | PRN
  Administered 2023-09-04: 6.36 via INTRAVENOUS

## 2023-09-04 NOTE — Progress Notes (Signed)
Pt family member sent message stating that pt is symptomatic and possibly needs blood transfusion. Lab appt made and lab orders place. Pt family member [name] called and made aware. Also, called Dr.Karki office to f/u with endoscopy appt. Per office, Dr.Karki nurse will be notified and give pt a call to schedule.

## 2023-09-05 ENCOUNTER — Inpatient Hospital Stay: Payer: Medicare Other | Attending: Hematology and Oncology

## 2023-09-05 DIAGNOSIS — D500 Iron deficiency anemia secondary to blood loss (chronic): Secondary | ICD-10-CM

## 2023-09-05 DIAGNOSIS — C50411 Malignant neoplasm of upper-outer quadrant of right female breast: Secondary | ICD-10-CM | POA: Insufficient documentation

## 2023-09-05 DIAGNOSIS — C7802 Secondary malignant neoplasm of left lung: Secondary | ICD-10-CM | POA: Insufficient documentation

## 2023-09-05 DIAGNOSIS — Z5111 Encounter for antineoplastic chemotherapy: Secondary | ICD-10-CM | POA: Insufficient documentation

## 2023-09-05 DIAGNOSIS — C778 Secondary and unspecified malignant neoplasm of lymph nodes of multiple regions: Secondary | ICD-10-CM | POA: Diagnosis not present

## 2023-09-05 DIAGNOSIS — K922 Gastrointestinal hemorrhage, unspecified: Secondary | ICD-10-CM

## 2023-09-05 DIAGNOSIS — C7801 Secondary malignant neoplasm of right lung: Secondary | ICD-10-CM | POA: Diagnosis not present

## 2023-09-05 DIAGNOSIS — Z170 Estrogen receptor positive status [ER+]: Secondary | ICD-10-CM | POA: Diagnosis not present

## 2023-09-05 LAB — SAMPLE TO BLOOD BANK

## 2023-09-05 LAB — CBC WITH DIFFERENTIAL (CANCER CENTER ONLY)
Abs Immature Granulocytes: 0.02 10*3/uL (ref 0.00–0.07)
Basophils Absolute: 0 10*3/uL (ref 0.0–0.1)
Basophils Relative: 1 %
Eosinophils Absolute: 0.1 10*3/uL (ref 0.0–0.5)
Eosinophils Relative: 4 %
HCT: 28.3 % — ABNORMAL LOW (ref 36.0–46.0)
Hemoglobin: 9.1 g/dL — ABNORMAL LOW (ref 12.0–15.0)
Immature Granulocytes: 1 %
Lymphocytes Relative: 15 %
Lymphs Abs: 0.4 10*3/uL — ABNORMAL LOW (ref 0.7–4.0)
MCH: 35.4 pg — ABNORMAL HIGH (ref 26.0–34.0)
MCHC: 32.2 g/dL (ref 30.0–36.0)
MCV: 110.1 fL — ABNORMAL HIGH (ref 80.0–100.0)
Monocytes Absolute: 0.4 10*3/uL (ref 0.1–1.0)
Monocytes Relative: 13 %
Neutro Abs: 1.9 10*3/uL (ref 1.7–7.7)
Neutrophils Relative %: 66 %
Platelet Count: 119 10*3/uL — ABNORMAL LOW (ref 150–400)
RBC: 2.57 MIL/uL — ABNORMAL LOW (ref 3.87–5.11)
RDW: 24.7 % — ABNORMAL HIGH (ref 11.5–15.5)
WBC Count: 2.8 10*3/uL — ABNORMAL LOW (ref 4.0–10.5)
nRBC: 0 % (ref 0.0–0.2)

## 2023-09-09 ENCOUNTER — Inpatient Hospital Stay: Payer: Medicare Other | Admitting: Hematology and Oncology

## 2023-09-09 ENCOUNTER — Inpatient Hospital Stay: Payer: Medicare Other

## 2023-09-16 ENCOUNTER — Inpatient Hospital Stay (HOSPITAL_BASED_OUTPATIENT_CLINIC_OR_DEPARTMENT_OTHER): Payer: Medicare Other | Admitting: Hematology and Oncology

## 2023-09-16 ENCOUNTER — Inpatient Hospital Stay: Payer: Medicare Other

## 2023-09-16 VITALS — BP 130/68 | HR 109 | Temp 97.7°F | Resp 16 | Wt 127.9 lb

## 2023-09-16 DIAGNOSIS — C50911 Malignant neoplasm of unspecified site of right female breast: Secondary | ICD-10-CM | POA: Diagnosis not present

## 2023-09-16 DIAGNOSIS — Z5111 Encounter for antineoplastic chemotherapy: Secondary | ICD-10-CM | POA: Diagnosis not present

## 2023-09-16 DIAGNOSIS — C7800 Secondary malignant neoplasm of unspecified lung: Secondary | ICD-10-CM

## 2023-09-16 DIAGNOSIS — M810 Age-related osteoporosis without current pathological fracture: Secondary | ICD-10-CM

## 2023-09-16 DIAGNOSIS — Z170 Estrogen receptor positive status [ER+]: Secondary | ICD-10-CM

## 2023-09-16 DIAGNOSIS — D500 Iron deficiency anemia secondary to blood loss (chronic): Secondary | ICD-10-CM

## 2023-09-16 LAB — CBC WITH DIFFERENTIAL/PLATELET
Abs Immature Granulocytes: 0.03 10*3/uL (ref 0.00–0.07)
Basophils Absolute: 0 10*3/uL (ref 0.0–0.1)
Basophils Relative: 1 %
Eosinophils Absolute: 0.1 10*3/uL (ref 0.0–0.5)
Eosinophils Relative: 3 %
HCT: 33.8 % — ABNORMAL LOW (ref 36.0–46.0)
Hemoglobin: 11.1 g/dL — ABNORMAL LOW (ref 12.0–15.0)
Immature Granulocytes: 1 %
Lymphocytes Relative: 10 %
Lymphs Abs: 0.3 10*3/uL — ABNORMAL LOW (ref 0.7–4.0)
MCH: 36.3 pg — ABNORMAL HIGH (ref 26.0–34.0)
MCHC: 32.8 g/dL (ref 30.0–36.0)
MCV: 110.5 fL — ABNORMAL HIGH (ref 80.0–100.0)
Monocytes Absolute: 0.1 10*3/uL (ref 0.1–1.0)
Monocytes Relative: 4 %
Neutro Abs: 2.7 10*3/uL (ref 1.7–7.7)
Neutrophils Relative %: 81 %
Platelets: 177 10*3/uL (ref 150–400)
RBC: 3.06 MIL/uL — ABNORMAL LOW (ref 3.87–5.11)
RDW: 20.5 % — ABNORMAL HIGH (ref 11.5–15.5)
WBC: 3.3 10*3/uL — ABNORMAL LOW (ref 4.0–10.5)
nRBC: 0 % (ref 0.0–0.2)

## 2023-09-16 LAB — COMPREHENSIVE METABOLIC PANEL
ALT: 12 U/L (ref 0–44)
AST: 17 U/L (ref 15–41)
Albumin: 4.3 g/dL (ref 3.5–5.0)
Alkaline Phosphatase: 66 U/L (ref 38–126)
Anion gap: 8 (ref 5–15)
BUN: 24 mg/dL — ABNORMAL HIGH (ref 8–23)
CO2: 26 mmol/L (ref 22–32)
Calcium: 9.7 mg/dL (ref 8.9–10.3)
Chloride: 103 mmol/L (ref 98–111)
Creatinine, Ser: 1.29 mg/dL — ABNORMAL HIGH (ref 0.44–1.00)
GFR, Estimated: 45 mL/min — ABNORMAL LOW (ref >60)
Glucose, Bld: 100 mg/dL — ABNORMAL HIGH (ref 70–99)
Potassium: 4 mmol/L (ref 3.5–5.1)
Sodium: 137 mmol/L (ref 135–145)
Total Bilirubin: 0.7 mg/dL (ref 0.3–1.2)
Total Protein: 7.3 g/dL (ref 6.5–8.1)

## 2023-09-16 MED ORDER — DENOSUMAB 60 MG/ML ~~LOC~~ SOSY
60.0000 mg | PREFILLED_SYRINGE | Freq: Once | SUBCUTANEOUS | Status: AC
  Administered 2023-09-16: 60 mg via SUBCUTANEOUS
  Filled 2023-09-16: qty 1

## 2023-09-16 MED ORDER — FULVESTRANT 250 MG/5ML IM SOSY
500.0000 mg | PREFILLED_SYRINGE | Freq: Once | INTRAMUSCULAR | Status: AC
  Administered 2023-09-16: 500 mg via INTRAMUSCULAR
  Filled 2023-09-16: qty 10

## 2023-09-16 NOTE — Progress Notes (Signed)
[facility]  Telephone:([phone] Fax:([phone]    ID: Nicole [name]   DOB: [birth year]  MR#: [id number]  JYN#:[id number]  Patient Care Team: Rachel Moulds, MD as PCP - General (Hematology and Oncology) Annamarie Major, CRNP as Nurse Practitioner (Nurse Practitioner) [name], MD as Consulting Physician (General Surgery) Kerin Salen, MD as Consulting Physician (Gastroenterology)  CHIEF COMPLAINT:  Metastatic Breast Cancer (s/p right mastectomy)  CURRENT TREATMENT: Ibrance and Faslodex, prolia every 6 months.  INTERVAL HISTORY:  Discussed the use of AI scribe software for clinical note transcription with the patient, who gave verbal consent to proceed.  History of Present Illness   The patient, with a history of cancer, presents for a follow-up after a recent PET scan. She reports feeling a little short of breath, which she attributes to a lack of physical activity. She denies any recent colds or infections. She also denies any dental problems. She is currently on Ibrance and Faslodex for her cancer treatment, and Prolia for bone health. She has not experienced any blood in her stool or black stool. She also reports a frustrating experience with the scheduling and waiting times at the clinic.   Rest of the pertinent 10 point ROS reviewed and negative  COVID 19 VACCINATION STATUS: Status post Pfizer x2+ booster August 2021   BREAST CANCER HISTORY: From Dr. Theron Arista Rubin's 07/25/2010 note:  "This woman has not had any significant medical intervention for some time.  Her last mammogram was about seven years ago.  She noted a right breast mass about a year ago and did not seek immediate medical attention for this.  She ultimately, after some time, admitted this to her husband and self-referred herself for intervention.  She had a mammogram on 07/17/2010 with an ultrasound of the right breast.  This showed a lobulated mass upper right outer quadrant measuring at  least 6.3 x 7.3 cm and large right axillary lymph nodes were also noted.  There was skin thickening overlying the mass.  On physical exam, this mass was about an 8 cm with skin dimpling noted. Discolored area in the skin over the mass, fullness in the axilla.  Biopsy was recommended, which took place on 07/17/2010.  Pathology showed invasive ductal cancer involving both lymph node and breast.  The HER-2 was not amplified.  ER and PR both positive at 100% and 6% respectively.  Proliferative index was 12% involving both the lymph node and breast mass.  An MRI scan of both breasts was performed on 07/22/2010, essentially which showed a large heterogeneously enhancing mass of the right breast with washout kinetics measuring 6.4 x 4.4 x 5.0 cm.  Numerous satellite nodules were seen throughout the dominant mass.  There were several suspicious nodules in the upper central and upper inner quadrant of the right breast.  There were two enhancing subcentimeter nodules seen in the right pectoralis muscle.  No obvious chest wall nodules were seen; however, there was seen some metastatic adenopathy in the mediastinum as well as hila with a 2.4 x 2.0 cm mediastinal lymph node as well as 1.2 x 3.0 cm subcarinal lymph node.  There was a right hilar and probable left hilar adenopathy.  Bilateral pulmonary nodules were seen and a right T2 lesion was seen anterior right liver as well."  Her subsequent history is as detailed below.   PAST MEDICAL HISTORY: Past Medical History:  Diagnosis Date   Anemia    low iron   Arthritis    fingers  Blood transfusion 2012   Breast CA (HCC)    breast ca dx 06/2010, stage 4, right , Lung Metasis.Surgery, Chemo, Radiation   Breast cancer (HCC)    Clot    jugular  ?2015   Depression    Esophageal varices (HCC)    Headache    Mirgraine- rare since menopause   Heart murmur    mild, no cardiologist, from birth   Hematemesis 08/2017   History of kidney stones    no pain- shows in  CT- small 1 mm bilaterally- kidney   Hypertension    IBD (inflammatory bowel disease)    resolved   Idiopathic cirrhosis (HCC)    pt denies   Peripheral vascular disease (HCC) few yrs ago   clot in right jugular   Portal hypertension (HCC)    Radiation 07/21/12-09/07/12   5940 cGy   Restless legs     PAST SURGICAL HISTORY: Past Surgical History:  Procedure Laterality Date   BIOPSY  08/21/2020   Procedure: BIOPSY;  Surgeon: Kerin Salen, MD;  Location: [facility] ENDOSCOPY;  Service: Gastroenterology;;   COLONOSCOPY  2016   DILATION AND CURETTAGE OF UTERUS  2004?   ESOPHAGEAL BANDING  09/04/2017   Procedure: ESOPHAGEAL BANDING;  Surgeon: Kerin Salen, MD;  Location: [facility] ENDOSCOPY;  Service: Gastroenterology;;   ESOPHAGEAL BANDING  11/27/2017   Procedure: ESOPHAGEAL BANDING;  Surgeon: Kerin Salen, MD;  Location: [facility] ENDOSCOPY;  Service: Gastroenterology;;   ESOPHAGEAL BANDING N/A 04/28/2018   Procedure: ESOPHAGEAL BANDING;  Surgeon: Kerin Salen, MD;  Location: [facility] ENDOSCOPY;  Service: Gastroenterology;  Laterality: N/A;   ESOPHAGEAL BANDING  11/17/2018   Procedure: ESOPHAGEAL BANDING;  Surgeon: Kerin Salen, MD;  Location: [facility] ENDOSCOPY;  Service: Gastroenterology;;   ESOPHAGEAL BANDING  08/16/2019   Procedure: ESOPHAGEAL BANDING;  Surgeon: Kerin Salen, MD;  Location: [facility] ENDOSCOPY;  Service: Gastroenterology;;   ESOPHAGEAL BANDING N/A 08/21/2020   Procedure: ESOPHAGEAL BANDING;  Surgeon: Kerin Salen, MD;  Location: [facility] ENDOSCOPY;  Service: Gastroenterology;  Laterality: N/A;   ESOPHAGOGASTRODUODENOSCOPY N/A 04/28/2018   Procedure: ESOPHAGOGASTRODUODENOSCOPY (EGD);  Surgeon: Kerin Salen, MD;  Location: [facility] ENDOSCOPY;  Service: Gastroenterology;  Laterality: N/A;   ESOPHAGOGASTRODUODENOSCOPY N/A 11/17/2018   Procedure: ESOPHAGOGASTRODUODENOSCOPY (EGD);  Surgeon: Kerin Salen, MD;  Location: [facility] ENDOSCOPY;  Service: Gastroenterology;  Laterality: N/A;   ESOPHAGOGASTRODUODENOSCOPY N/A 08/13/2022   Procedure:  ESOPHAGOGASTRODUODENOSCOPY (EGD);  Surgeon: Kerin Salen, MD;  Location: [facility] ENDOSCOPY;  Service: Gastroenterology;  Laterality: N/A;   ESOPHAGOGASTRODUODENOSCOPY (EGD) WITH PROPOFOL N/A 04/11/2016   Procedure: ESOPHAGOGASTRODUODENOSCOPY (EGD) WITH PROPOFOL;  Surgeon: Vida Rigger, MD;  Location: [facility] ENDOSCOPY;  Service: Endoscopy;  Laterality: N/A;   ESOPHAGOGASTRODUODENOSCOPY (EGD) WITH PROPOFOL N/A 07/08/2016   Procedure: ESOPHAGOGASTRODUODENOSCOPY (EGD) WITH PROPOFOL;  Surgeon: Carman Ching, MD;  Location: [facility] ENDOSCOPY;  Service: Endoscopy;  Laterality: N/A;   ESOPHAGOGASTRODUODENOSCOPY (EGD) WITH PROPOFOL N/A 03/03/2017   Procedure: ESOPHAGOGASTRODUODENOSCOPY (EGD) WITH PROPOFOL;  Surgeon: Carman Ching, MD;  Location: [facility] ENDOSCOPY;  Service: Endoscopy;  Laterality: N/A;   ESOPHAGOGASTRODUODENOSCOPY (EGD) WITH PROPOFOL N/A 09/04/2017   Procedure: ESOPHAGOGASTRODUODENOSCOPY (EGD) WITH PROPOFOL;  Surgeon: Kerin Salen, MD;  Location: [facility] ENDOSCOPY;  Service: Gastroenterology;  Laterality: N/A;   ESOPHAGOGASTRODUODENOSCOPY (EGD) WITH PROPOFOL N/A 11/27/2017   Procedure: ESOPHAGOGASTRODUODENOSCOPY (EGD);  Surgeon: Kerin Salen, MD;  Location: [facility] ENDOSCOPY;  Service: Gastroenterology;  Laterality: N/A;   ESOPHAGOGASTRODUODENOSCOPY (EGD) WITH PROPOFOL N/A 08/16/2019   Procedure: ESOPHAGOGASTRODUODENOSCOPY (EGD) WITH PROPOFOL;  Surgeon: Kerin Salen, MD;  Location: [facility] ENDOSCOPY;  Service: Gastroenterology;  Laterality: N/A;  ESOPHAGOGASTRODUODENOSCOPY (EGD) WITH PROPOFOL N/A 08/21/2020   Procedure: ESOPHAGOGASTRODUODENOSCOPY (EGD) WITH PROPOFOL;  Surgeon: Kerin Salen, MD;  Location: [facility] ENDOSCOPY;  Service: Gastroenterology;  Laterality: N/A;   ESOPHAGOGASTRODUODENOSCOPY (EGD) WITH PROPOFOL N/A 08/24/2021   Procedure: ESOPHAGOGASTRODUODENOSCOPY (EGD) WITH PROPOFOL;  Surgeon: Kerin Salen, MD;  Location: [facility] ENDOSCOPY;  Service: Gastroenterology;  Laterality: N/A;   ESOPHAGOGASTRODUODENOSCOPY (EGD) WITH PROPOFOL N/A  06/25/2023   Procedure: ESOPHAGOGASTRODUODENOSCOPY (EGD) WITH PROPOFOL;  Surgeon: Vida Rigger, MD;  Location: [facility] ENDOSCOPY;  Service: Gastroenterology;  Laterality: N/A;  W. bandging   GASTRIC VARICES BANDING N/A 08/24/2021   Procedure: GASTRIC VARICES BANDING;  Surgeon: Kerin Salen, MD;  Location: [facility] ENDOSCOPY;  Service: Gastroenterology;  Laterality: N/A;   GASTRIC VARICES BANDING N/A 08/13/2022   Procedure: GASTRIC VARICES BANDING;  Surgeon: Kerin Salen, MD;  Location: [facility] ENDOSCOPY;  Service: Gastroenterology;  Laterality: N/A;   HEMOSTASIS CLIP PLACEMENT  08/21/2020   Procedure: HEMOSTASIS CLIP PLACEMENT;  Surgeon: Kerin Salen, MD;  Location: [facility] ENDOSCOPY;  Service: Gastroenterology;;   INTERCOSTAL NERVE BLOCK Left 07/01/2022   Procedure: INTERCOSTAL NERVE BLOCK;  Surgeon: Loreli Slot, MD;  Location: [facility] OR;  Service: Thoracic;  Laterality: Left;   IR GENERIC HISTORICAL  05/15/2016   IR RADIOLOGIST EVAL & MGMT 05/15/2016 Gilmer Mor, DO [facility] INTERV RAD   IR GENERIC HISTORICAL  01/14/2017   IR US GUIDE VASC ACCESS RIGHT 01/14/2017 Berdine Dance, MD [facility]   IR GENERIC HISTORICAL  01/14/2017   IR VENOGRAM HEPATIC W HEMODYNAMIC EVALUATION 01/14/2017 Berdine Dance, MD [facility]   IR RADIOLOGIST EVAL & MGMT  06/12/2017   IR RADIOLOGIST EVAL & MGMT  08/11/2018   IR RADIOLOGIST EVAL & MGMT  08/25/2019   IR RADIOLOGIST EVAL & MGMT  08/22/2020   IR RADIOLOGIST EVAL & MGMT  03/11/2022   MASTECTOMY Right 06/02/12   right breast with lymph node removal   NODE DISSECTION Left 07/01/2022   Procedure: NODE DISSECTION;  Surgeon: Loreli Slot, MD;  Location: [facility] OR;  Service: Thoracic;  Laterality: Left;   PORT-A-CATH REMOVAL     PORTACATH PLACEMENT  2011   left side   RADIOFREQUENCY ABLATION  06/25/2023   Procedure: RADIO FREQUENCY ABLATION;  Surgeon: Vida Rigger, MD;  Location: [facility] ENDOSCOPY;  Service: Gastroenterology;;   RADIOLOGY WITH ANESTHESIA N/A 04/13/2016   Procedure: RADIOLOGY  WITH ANESTHESIA;  Surgeon: Gilmer Mor, DO;  Location: [facility] OR;  Service: Anesthesiology;  Laterality: N/A;   VIDEO BRONCHOSCOPY WITH ENDOBRONCHIAL NAVIGATION N/A 07/01/2022   Procedure: VIDEO BRONCHOSCOPY WITH ENDOBRONCHIAL NAVIGATION;  Surgeon: Loreli Slot, MD;  Location: [facility] OR;  Service: Thoracic;  Laterality: N/A;   XI ROBOTIC ASSISTED THORACOSCOPY- SEGMENTECTOMY Left 07/01/2022   Procedure: XI ROBOTIC ASSISTED THORACOSCOPY-LEFT LOWER LOBE BASILAR SEGMENTECTOMY;  Surgeon: Loreli Slot, MD;  Location: [facility] OR;  Service: Thoracic;  Laterality: Left;    FAMILY HISTORY Family History  Problem Relation Age of Onset   COPD Mother    AAA (abdominal aortic aneurysm) Mother    COPD Father    Cancer Neg Hx   The patient's father died at the age of 33 from heart disease. Patient's mother died at the age of 18 status post CABG. The patient had no brothers, 2 sisters. There is no history of breast or ovarian cancer in the family.   GYNECOLOGIC HISTORY: Updated May 2018 Menarche age 39, she is GX P0. She does not recall when she went through menopause. She never took hormone replacement.   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 Husband [name] is retired from [employer]. They have no children and no pets. They attend a local [address].    ADVANCED DIRECTIVES: Not in place   HEALTH MAINTENANCE: Social History   Tobacco Use   Smoking status: Never   Smokeless tobacco: Never  Vaping Use   Vaping status: Never Used  Substance Use Topics   Alcohol use: No   Drug use: No     Colonoscopy: Never  PAP: Remote  Bone density: 09/27/2013, osteoporosis with a T score of -2.8  Lipid panel: Not on file    No Known Allergies   Current Outpatient Medications  Medication Sig Dispense Refill   denosumab (PROLIA) 60 MG/ML SOLN injection Inject 60 mg into the skin every 6  (six) months. Administer in upper arm, thigh, or abdomen     ELIQUIS 5 MG TABS tablet TAKE 1 TABLET BY MOUTH TWICE A DAY 60 tablet 2   folic acid (FOLVITE) 1 MG tablet Take 1 mg by mouth daily.     furosemide (LASIX) 20 MG tablet Take 20 mg by mouth daily.     Milk Thistle 1000 MG CAPS Take 1,000 mg by mouth daily.     palbociclib (IBRANCE) 75 MG tablet Take 1 tablet (75 mg total) by mouth daily. Take for 21 days on, 7 days off, repeat every 28 days. 21 tablet 3   pantoprazole (PROTONIX) 40 MG tablet Take 40 mg by mouth daily.     propranolol (INDERAL) 20 MG tablet Take 1 tablet (20 mg total) by mouth 2 (two) times daily. 60 tablet 0   spironolactone (ALDACTONE) 25 MG tablet Take 25 mg by mouth daily.     venlafaxine XR (EFFEXOR-XR) 150 MG 24 hr capsule Take 1 capsule (150 mg total) by mouth daily with breakfast. 90 capsule 3   No current facility-administered medications for this visit.   Facility-Administered Medications Ordered in Other Visits  Medication Dose Route Frequency Provider Last Rate Last Admin   denosumab (PROLIA) injection 60 mg  60 mg Subcutaneous Once Magrinat, Valentino Hue, MD       denosumab (PROLIA) injection 60 mg  60 mg Subcutaneous Once Maleena Eddleman, Burnice Logan, MD       fulvestrant (FASLODEX) injection 500 mg  500 mg Intramuscular Once Rachel Moulds, MD        OBJECTIVE: white woman who appears younger than stated age  Vitals:   09/16/23 1434  BP: 130/68  Pulse: (!) 109  Resp: 16  Temp: 97.7 F (36.5 C)  TempSrc: Temporal  SpO2: 96%  Weight: 127 lb 14.4 oz (58 kg)    Physical Exam Constitutional:      Appearance: Normal appearance. She is not ill-appearing.     Comments:    Cardiovascular:     Rate and Rhythm: Normal rate and regular rhythm.     Pulses: Normal pulses.     Heart sounds: Normal heart sounds.  Pulmonary:     Effort: Pulmonary effort is normal.     Breath sounds: Normal breath sounds.  Abdominal:     General: There is no distension.   Musculoskeletal:        General: No swelling or tenderness. Normal range of motion.     Cervical back: Normal range of motion. No rigidity.  Lymphadenopathy:     Cervical: No cervical adenopathy.  Skin:  General: Skin is warm and dry.  Neurological:     General: No focal deficit present.     Mental Status: She is alert.    LAB RESULTS:  CMP     Component Value Date/Time   NA 137 09/16/2023 1403   NA 142 11/17/2017 1355   K 4.0 09/16/2023 1403   K 4.4 11/17/2017 1355   CL 103 09/16/2023 1403   CL 109 (H) 04/20/2013 0859   CO2 26 09/16/2023 1403   CO2 22 11/17/2017 1355   GLUCOSE 100 (H) 09/16/2023 1403   GLUCOSE 86 11/17/2017 1355   GLUCOSE 91 04/20/2013 0859   BUN 24 (H) 09/16/2023 1403   BUN 12.5 11/17/2017 1355   CREATININE 1.29 (H) 09/16/2023 1403   CREATININE 1.07 (H) 09/23/2022 1327   CREATININE 1.1 11/17/2017 1355   CALCIUM 9.7 09/16/2023 1403   CALCIUM 9.2 11/17/2017 1355   PROT 7.3 09/16/2023 1403   PROT 7.0 11/17/2017 1355   ALBUMIN 4.3 09/16/2023 1403   ALBUMIN 3.8 11/17/2017 1355   AST 17 09/16/2023 1403   AST 14 (L) 09/23/2022 1327   AST 23 11/17/2017 1355   ALT 12 09/16/2023 1403   ALT 11 09/23/2022 1327   ALT 22 11/17/2017 1355   ALKPHOS 66 09/16/2023 1403   ALKPHOS 55 11/17/2017 1355   BILITOT 0.7 09/16/2023 1403   BILITOT 0.7 09/23/2022 1327   BILITOT 0.49 11/17/2017 1355   GFRNONAA 45 (L) 09/16/2023 1403   GFRNONAA 57 (L) 09/23/2022 1327   GFRNONAA 53 (L) 05/09/2016 1104   GFRAA 53 (L) 06/12/2020 1146   GFRAA 55 (L) 12/13/2019 1200   GFRAA 61 05/09/2016 1104    No results found for: "TOTALPROTELP", "ALBUMINELP", "A1GS", "A2GS", "BETS", "BETA2SER", "GAMS", "MSPIKE", "SPEI"  Lab Results  Component Value Date   WBC 3.3 (L) 09/16/2023   NEUTROABS 2.7 09/16/2023   HGB 11.1 (L) 09/16/2023   HCT 33.8 (L) 09/16/2023   MCV 110.5 (H) 09/16/2023   PLT 177 09/16/2023    Lab Results  Component Value Date   LABCA2 18 08/21/2012    No  components found for: "YNWGNF621"  No results for input(s): "INR" in the last 168 hours.  Lab Results  Component Value Date   LABCA2 18 08/21/2012    No results found for: "HYQ657"  No results found for: "CAN125"  No results found for: "QIO962"  Lab Results  Component Value Date   CA2729 26.9 12/13/2019    No components found for: "HGQUANT"  No results found for: "CEA1", "CEA" / No results found for: "CEA1", "CEA"   No results found for: "AFPTUMOR"  No results found for: "CHROMOGRNA"  No results found for: "KPAFRELGTCHN", "LAMBDASER", "KAPLAMBRATIO" (kappa/lambda light chains)  No results found for: "HGBA", "HGBA2QUANT", "HGBFQUANT", "HGBSQUAN" (Hemoglobinopathy evaluation)   Lab Results  Component Value Date   LDH 195 08/21/2012    Lab Results  Component Value Date   IRON 98 07/22/2023   TIBC 547 (H) 07/22/2023   IRONPCTSAT 18 07/22/2023   (Iron and TIBC)  Lab Results  Component Value Date   FERRITIN 12 07/22/2023    Urinalysis    Component Value Date/Time   COLORURINE YELLOW 06/28/2022 1122   APPEARANCEUR CLEAR 06/28/2022 1122   LABSPEC 1.015 06/28/2022 1122   LABSPEC 1.010 04/29/2016 1118   PHURINE 6.0 06/28/2022 1122   GLUCOSEU NEGATIVE 06/28/2022 1122   GLUCOSEU Negative 04/29/2016 1118   HGBUR NEGATIVE 06/28/2022 1122   BILIRUBINUR NEGATIVE 06/28/2022 1122   BILIRUBINUR Negative  04/29/2016 1118   KETONESUR NEGATIVE 06/28/2022 1122   PROTEINUR NEGATIVE 06/28/2022 1122   UROBILINOGEN 0.2 04/29/2016 1118   NITRITE NEGATIVE 06/28/2022 1122   LEUKOCYTESUR NEGATIVE 06/28/2022 1122   LEUKOCYTESUR Trace 04/29/2016 1118   CBC from today shows significant improvement in hemoglobin.  White blood cell count is 3100, total neutrophil count is 2500, no thrombocytopenia  STUDIES: NM PET Image Restage (PS) Skull Base to Thigh (F-18 FDG)  Result Date: 09/15/2023 CLINICAL DATA:  Subsequent treatment strategy for metastatic breast cancer. EXAM: NUCLEAR  MEDICINE PET SKULL BASE TO THIGH TECHNIQUE: 6.4 mCi F-18 FDG was injected intravenously. Full-ring PET imaging was performed from the skull base to thigh after the radiotracer. CT data was obtained and used for attenuation correction and anatomic localization. Fasting blood glucose: 94 mg/dl COMPARISON:  16/09/9603. FINDINGS: Mediastinal blood pool activity: SUV max 2.6 Liver activity: SUV max NA NECK: No abnormal hypermetabolism. Incidental CT findings: None. CHEST: Similar mild right hilar hypermetabolism, SUV max 4.1. No additional abnormal hypermetabolism. Incidental CT findings: Atherosclerotic calcification of the aorta and aortic valve. Heart size normal. No pericardial effusion. Partially loculated small left pleural effusion, similar. Resolved right pleural effusion. Biapical pleuroparenchymal scarring, right greater than left. Subpleural scarring in the anterior right lung. Mild pleural nodularity in the right hemithorax, unchanged. Patchy peribronchovascular ground-glass and slight nodularity in the posterior right lower lobe, new. Scattered peribronchovascular nodularity in the left lung, similar, too small for PET resolution. Postoperative scarring in the lower left hemithorax. ABDOMEN/PELVIS: No abnormal hypermetabolism. Incidental CT findings: Liver, gallbladder and adrenal glands are grossly unremarkable. Tiny right renal stone. Kidneys, spleen, pancreas, stomach and bowel are otherwise grossly unremarkable. SKELETON: No abnormal hypermetabolism. Incidental CT findings: Degenerative changes in the spine. IMPRESSION: 1. No evidence high of hypermetabolic metastatic disease. 2. Scattered pleural nodularity in the right hemithorax. New peribronchovascular ground-glass and slight nodularity in the posterior right lower lobe, possibly infectious/inflammatory etiology. Recommend attention on follow-up. 3. Small loculated left pleural effusion. 4. Similar nonspecific right hilar hypermetabolism. 5. Tiny  right renal stone. 6.  Aortic atherosclerosis (ICD10-I70.0). Electronically Signed   By: Leanna Battles M.D.   On: 09/15/2023 10:11    COMPARISON: Abdominal ultrasound November 05, 2021   TECHNIQUE:  Real time limited abdomen ultrasound was performed.   IMAGE QUALITY: Satisfactory, of diagnostic value.   FINDINGS:   Limited sonographic views of the abdomen demonstrates mild abdominal ascites. There is no large pocket to safely drain.    IMPRESSION:  IMPRESSION:  1. Mild abdominal ascites.  2. No large enough pocket to safely drain.    ASSESSMENT: 67 y.o. [place] woman presenting June 2011 with stage IV breast cancer involving the right breast upper outer quadrant, right axilla, mediastinal lymph nodes, and both lungs, but not the brain, liver, or bones  (1) positive right breast and right axillary lymph node biopsies 07/17/2010 of a clinical T4 N2 M1 invasive ductal carcinoma, grade 1, estrogen receptor 100% positive, progesterone receptor 6% positive, with an MIB-1 of 12%, and no HER-2 amplification.  (2) participated in Phase II tessetaxel study, receiving 2 cycles complicated by thrombocytopenia, anemia requiring transfusion, transaminase elevation, and afebrile neutropenia. Off-study as of September 2011. Chest CT scan September 2011 did show evidence of response.  (3) on letrozole as of October 2011, with continuing response and good tolerance   (4) 06/02/2012 underwent right mastectomy and axillary lymph node sampling (3 lymph nodes removed, all with viable cancer as well as evidence of treatment effect) for a  ypT2 ypN1-2 invasive ductal carcinoma, grade 1, 98% estrogen receptor positive, progesterone receptor negative, with no HER-2 amplification  (5) left jugular vein DVT documented 06/16/2012; left sided Port-A-Cath removed mid July 2015; Coumadin stopped 09/07/2014.  (a) Left brachiocephalic v. slightly narrowed, with some Left lower neck collateralization noted on chest CT  December 2015  (6) osteoporosis, was on alendronate, switched to denosumab/Prolia 11/07/2016  (a) bone density in 10/2018 shows osteopenia T score of -2.2  (7) portal hypertension, with splenomegaly, gastric varices, and intestinal edema  (a)  mild persistent leukopenia and thrombocytopenia likely due to splenomegaly  (b) iron deficiency secondary to above, status post Feraheme x2 December 2018  (8) restaging studies:  (a) chest CT 12/26/2017 shows a 4.5 mm left lower lobe pulmonary nodule which is unchanged.  There was no other evidence of active disease  (b) restaging chest CT in 05/2019 shows no change in bilateral pulmonary nodules, prominent mediastinal lymph nodes are stable  (9) CT scan of the abdomen 11/03/2021 shows thrombus in the portal, splenic and superior mesenteric vein  (A) Heparin started 11/03/2021  10. PET scan 05/24/2022, low metabolic activity of right lower pulmonary nodule. No change in size of CT 02/27/2022, Carcinoma with mucinous features consistent with metastatic breast carcinoma, negative margins, no LN involvement.  Prognostics showed ER +100% strong staining, PR +20% staining, Ki-67 of 10% and HER2 2+ by IHC and negative by FISH  PLAN:  Given metastatic breast cancer progression on letrozole,she is now on Faslodex with Ibrance.   She is currently on this combination and is tolerating it very well.   She also continues on Prolia every 6 months for osteoporosis, last bone density in March 2023 with a T score of -2.4 improved. She is due for this today.  Recent PET scan shows no evidence of disease progression. Noted ground glass nodularity in the right lower lobe, possibly due to past infection. -Continue Ibrance and Faslodex. -Order CT chest in six weeks to follow up on lung opacities.  Osteoporosis No reported dental problems. Due for Prolia injection. -Administer Prolia injection today.  General Health Maintenance -Continue monthly Faslodex  injections. -Schedule follow-up appointment in two months to review CT results.      Total time: 30 min *Total Encounter Time as defined by the Centers for Medicare and Medicaid Services includes, in addition to the face-to-face time of a patient visit (documented in the note above) non-face-to-face time: obtaining and reviewing outside history, ordering and reviewing medications, tests or procedures, care coordination (communications with other health care professionals or caregivers) and documentation in the medical record.

## 2023-09-17 LAB — TYPE AND SCREEN
ABO/RH(D): O POS
Antibody Screen: POSITIVE

## 2023-10-02 ENCOUNTER — Other Ambulatory Visit: Payer: Self-pay | Admitting: *Deleted

## 2023-10-02 DIAGNOSIS — Z170 Estrogen receptor positive status [ER+]: Secondary | ICD-10-CM

## 2023-10-02 MED ORDER — PALBOCICLIB 75 MG PO TABS: 75.0000 mg | ORAL_TABLET | Freq: Every day | ORAL | 3 refills | Status: DC

## 2023-10-08 ENCOUNTER — Other Ambulatory Visit: Payer: Self-pay | Admitting: Gastroenterology

## 2023-10-09 ENCOUNTER — Inpatient Hospital Stay: Payer: Medicare Other | Attending: Hematology and Oncology

## 2023-10-10 ENCOUNTER — Inpatient Hospital Stay: Payer: Medicare Other | Attending: Hematology and Oncology

## 2023-10-10 VITALS — BP 92/71 | HR 109 | Temp 98.6°F

## 2023-10-10 DIAGNOSIS — Z170 Estrogen receptor positive status [ER+]: Secondary | ICD-10-CM | POA: Insufficient documentation

## 2023-10-10 DIAGNOSIS — C778 Secondary and unspecified malignant neoplasm of lymph nodes of multiple regions: Secondary | ICD-10-CM | POA: Insufficient documentation

## 2023-10-10 DIAGNOSIS — C7801 Secondary malignant neoplasm of right lung: Secondary | ICD-10-CM | POA: Insufficient documentation

## 2023-10-10 DIAGNOSIS — M810 Age-related osteoporosis without current pathological fracture: Secondary | ICD-10-CM

## 2023-10-10 DIAGNOSIS — Z1721 Progesterone receptor positive status: Secondary | ICD-10-CM | POA: Insufficient documentation

## 2023-10-10 DIAGNOSIS — C50411 Malignant neoplasm of upper-outer quadrant of right female breast: Secondary | ICD-10-CM | POA: Diagnosis not present

## 2023-10-10 DIAGNOSIS — C7802 Secondary malignant neoplasm of left lung: Secondary | ICD-10-CM | POA: Insufficient documentation

## 2023-10-10 DIAGNOSIS — Z5111 Encounter for antineoplastic chemotherapy: Secondary | ICD-10-CM | POA: Insufficient documentation

## 2023-10-10 MED ORDER — FULVESTRANT 250 MG/5ML IM SOSY
500.0000 mg | PREFILLED_SYRINGE | Freq: Once | INTRAMUSCULAR | Status: AC
  Administered 2023-10-10: 500 mg via INTRAMUSCULAR
  Filled 2023-10-10: qty 10

## 2023-10-10 NOTE — Progress Notes (Signed)
Ok to proceed with fulvestrant on 10/11 with last dose being 9/17 per Dr. Samul Dada, RN.

## 2023-10-14 ENCOUNTER — Encounter (HOSPITAL_COMMUNITY): Payer: Self-pay | Admitting: Gastroenterology

## 2023-10-14 NOTE — Progress Notes (Signed)
Attempted to obtain medical history for pre op call via telephone, unable to reach at this time. HIPAA compliant voicemail message left requesting return call to pre surgical testing department.

## 2023-10-15 ENCOUNTER — Telehealth: Payer: Self-pay

## 2023-10-15 NOTE — Telephone Encounter (Signed)
[facility] CSW Progress Note  Clinical Social Worker  attempted to contact patient  to follow up on voicemail message.No answer, left vm with direct contact information.     Marguerita Merles, LCSWA Clinical Social Worker [facility]

## 2023-10-17 ENCOUNTER — Telehealth: Payer: Self-pay

## 2023-10-17 NOTE — Telephone Encounter (Signed)
Received call from Hajar at [facility] asking for hold instructions for Eliquis pt pt's upcoming endoscopy 10/21/2023. Our office has faxed recommendation several times since June, but [facility] reports they have not received reported faxes.  Per MD, pt should hold Elisuis three days prior to procedure and resume the day after the procedure.   This note faxed to alternate fax number at [facility] [phone].

## 2023-10-18 ENCOUNTER — Other Ambulatory Visit: Payer: Self-pay | Admitting: Hematology and Oncology

## 2023-10-20 ENCOUNTER — Encounter: Payer: Self-pay | Admitting: Hematology and Oncology

## 2023-10-20 NOTE — H&P (Signed)
History of Present Illness    General:  67 year old female, stage IV metastatic breast cancer diagnosed in 2011, status post right mastectomy, chemotherapy and radiation, history of DVT on Eliquis        Labs 05/27/2023 showed sodium 137, BUN 18, creatinine 0.9, T. bili/AST/ALT/ALP of 0.7/14/9/66, more importantly hemoglobin was only 6.4 with an MCV of 98.6 and platelets of 99.  Ultrasound from 05/30/2023 showed moderate ascites        Colonoscopy in 2016 showed diverticulosis, repeat in 10 years        EGD performed 8/22 showed grade 2 esophageal varices, for bands were placed, mild portal hypertensive gastropathy     EGD 8/23: Grade 2 esophageal varices, 2 bands placed, repeat recommended in 1 year She was given 2 units of blood after her labs showed low Hb. She has not had dark stools and has not thrown up blood. She continues to take pantoprazole 40 mg a day. She has regular Bms, usually 1 /day, usually the color of stool is brown. She has mild abdominal distension and has an appointment for paracentesis at [facility] on 06/06/23. Denies confusion, her husband drove her today. She is afraid to drive as she let her license expire. Denies acid reflux or heartburn, denies difficulty or pain on swallowing. She has a good appetite, denies unintentional weight loss. She noticed that her abdomen started getting distended and upper abdominal fullness for 1 week.  Current Medications Taking Pantoprazole Sodium 40 MG Tablet Delayed Release TAKE 1 TABLET BY MOUTH EVERY DAY Propranolol HCl 20 MG Tablet TAKE 1 TABLET BY MOUTH TWICE A DAY Effexor XR 150 MG Capsule Extended Release 24 Hour 1 capsule with food Orally Once a day , Notes to Pharmacist: Dr. Darnelle Catalan Ibrance(Palbociclib) 75 MG Capsule 1 capsule for 3weeks. Off 1wk and start again 3 weeks cycle Orally Once a day Eliquis 5 mg Tablet 1 tablet orally twice a day Prolia(Denosumab) 60 MG/ML Solution Subcutaneous Milk Thistle 175 MG Capsule as directed  Orally Folic Acid 1 MG Tablet 1 tablet Orally Once a day  Past Medical History Breast CA, right - Stage 4 Dx 07/06/2010. Lung cancer(metastatic to lung). Deep vein thrombosis in left side of neck (think due to her prior port-a-cath). Lymphedema. IBD. Heart murmur. Blood transfusion - 2012. Radiation 07/21/2012-09/07/2012. Leukopenia. Osteoporosis. Thrombocytopenia (persistent since chemo).  Surgical History portacath placement, left side 2011 D&C 2004? right mastectomy 06/02/2012 portacath removal 06/2014 colonoscopy 02/28/2015 esophageal varicies - [facility] - Dr. Ewing Schlein. Dr. Loreta Ave 03/2016, 07/2019 egd 08/2017 RT lower lobe segmentectomy 07/01/2022  Family History Father: deceased 38 yrs, heart disease, diagnosed with Coronary artery disease Mother: deceased 61 yrs, CABG, diagnosed with Coronary artery disease Sister 1: alive, polyps Sister 2: alive 2 sister(s) . Negative family hx colon cancer, and liver disease.  Social History    General:  Tobacco use  cigarettes: Never smoked Tobacco history last updated 06/03/2023 EXPOSURE TO PASSIVE SMOKE: no. Alcohol: no, no. Caffeine: yes, coffee 1 serving daily, , tea daily- unsweet. Recreational drug use: no. Exercise: no. Marital Status: married. Children: none. EDUCATION: Some College. 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 COMMUNICATION BARRIERS: none.  Allergies N.K.D.A.  Hospitalization/Major Diagnostic Procedure Not in the past year 07/2021 [place] procedure - see above 04/17 EGD 08/2017 Hospitalization due to blood clot in stomach 10/2021 none since 10/2021 03/2022 HOSP 07/01/2022 See above sx Review of Systems    GI PROCEDURE:  Pacemaker/ AICD no.  Artificial heart valves no.  MI/heart attack no.  Abnormal heart rhythm no.  Angina no.  CVA no.  Hypertension YES, on meds  .  Hypotension no.  Asthma, COPD no.  Sleep apnea no.  Seizure disorders no.  Artificial joints  no.  Diabetes no.  Significant headaches no.  Vertigo no.  Depression/anxiety YES, anxiety on meds  .  Abnormal bleeding no.  Kidney Disease no.  Liver disease YES, have a fatty liver none alcholholic cirrosis  .  Blood transfusion no  Vital Signs Wt: 138.2, Wt change: 9.2 lbs, Ht: 63.75, BMI: 23.91, Temp: 97.8, Pulse sitting: 90, BP sitting: 123/81. Examination GENERAL APPEARANCE:  Well developed, well nourished, no active distress, pleasant.  SCLERA:  anicteric.  CARDIOVASCULAR  Normal RRR .  RESPIRATORY  Breath sounds normal. Respiration even and unlabored.  ABDOMEN  No masses palpated. Liver and spleen not palpated, normal. Bowel sounds normal, Abdomen distended- mostly in upper abdomen, compatible with ascites.  EXTREMITIES:  No edema.  NEURO:  alert, oriented to time, place and person, normal gait, no asterixis.  PSYCH:  mood/affect normal.   Assessments 1. Other cirrhosis of liver - K74.69 (Primary) 2. Other ascites - R18.8 3. Acute anemia - D64.9 4. Esophageal varices without bleeding, unspecified esophageal varices type - I85.00 5. Portal hypertension - K76.6 6. Other diseases of stomach and duodenum - K31.89 Treatment 1. Other cirrhosis of liver      IMAGING: EGD with Band Ligation of Varices (Ordered for 06/03/2023)     Notes: No signs of encephalopathy, but she has new onset of ascites. Has esophageal varices, no bleeding, has had banding in the past. Not a candidate for transplant consideration due to history of breast cancer.   2. Other ascites        Start Furosemide Tablet, 20 MG, 1 tablet, Orally, Once a day, 90 days, 90 Tablet, Refills 3      Start Spironolactone Tablet, 25 MG, 1 tablet, Orally, daily, 90 days, 90 Tablet, Refills 3     Notes: She is scheduled for paracentesis. Will start low dose diuretics, furosemide 20 mg and spironolactone 50 mg, will send prescriptions to her pharmacy for the same for 90 days with 3 refills. Will plan to order  following tests on ascitic fluid: 1. Cell count and differential 2. Cytology 3. Ascitic fluid albumin 4. Gram's stain and culture No limit for maximum amount to be removed, if more than 5 l is removed, please give Albumin IV 25 gm X 1 dose.     3. Acute anemia      IMAGING: EGD with Band Ligation of Varices (Ordered for 06/03/2023)     Notes: Without obvious melena, she may have anemia from chronic blood loss from portal hypertensive gastropathy. Recommend diagnnostic EGD, possible banding at [facility]. Will need to hold eliquis for at least 24 hours prior to EGD, will get approval from her hematologist for the same.   4. Esophageal varices without bleeding, unspecified esophageal varices type      IMAGING: EGD with Band Ligation of Varices (Ordered for 06/03/2023)     Notes: Continue propranolol 20 mg twice a day and pantoprazole 40 mg once a day.   5. Portal hypertension      IMAGING: EGD with Band Ligation of Varices (Ordered for 06/03/2023)     Notes: Likely cause of anemia is portal hypertensive gastropathy related chronic blood loss.   6. Other diseases of stomach and duodenum      IMAGING: EGD with Band Ligation of  Varices (Ordered for 06/03/2023)

## 2023-10-21 ENCOUNTER — Encounter (HOSPITAL_COMMUNITY)
Admission: RE | Disposition: A | Discharge status: Home / Self Care | Payer: Self-pay | Source: Home / Self Care | Attending: Gastroenterology

## 2023-10-21 ENCOUNTER — Ambulatory Visit (HOSPITAL_COMMUNITY)
Admission: RE | Admit: 2023-10-21 | Discharge: 2023-10-21 | Disposition: A | Discharge status: Home / Self Care | Payer: Medicare Other | Attending: Gastroenterology | Admitting: Gastroenterology

## 2023-10-21 ENCOUNTER — Encounter (HOSPITAL_COMMUNITY): Payer: Self-pay | Admitting: Gastroenterology

## 2023-10-21 ENCOUNTER — Ambulatory Visit (HOSPITAL_BASED_OUTPATIENT_CLINIC_OR_DEPARTMENT_OTHER): Payer: Medicare Other | Admitting: Anesthesiology

## 2023-10-21 ENCOUNTER — Ambulatory Visit (HOSPITAL_COMMUNITY): Payer: Medicare Other | Admitting: Anesthesiology

## 2023-10-21 ENCOUNTER — Other Ambulatory Visit: Payer: Self-pay

## 2023-10-21 DIAGNOSIS — K766 Portal hypertension: Secondary | ICD-10-CM | POA: Insufficient documentation

## 2023-10-21 DIAGNOSIS — K589 Irritable bowel syndrome without diarrhea: Secondary | ICD-10-CM | POA: Diagnosis not present

## 2023-10-21 DIAGNOSIS — Z853 Personal history of malignant neoplasm of breast: Secondary | ICD-10-CM | POA: Insufficient documentation

## 2023-10-21 DIAGNOSIS — K219 Gastro-esophageal reflux disease without esophagitis: Secondary | ICD-10-CM | POA: Insufficient documentation

## 2023-10-21 DIAGNOSIS — Z7901 Long term (current) use of anticoagulants: Secondary | ICD-10-CM | POA: Diagnosis not present

## 2023-10-21 DIAGNOSIS — K3189 Other diseases of stomach and duodenum: Secondary | ICD-10-CM | POA: Diagnosis not present

## 2023-10-21 DIAGNOSIS — I10 Essential (primary) hypertension: Secondary | ICD-10-CM | POA: Insufficient documentation

## 2023-10-21 DIAGNOSIS — I8500 Esophageal varices without bleeding: Secondary | ICD-10-CM

## 2023-10-21 DIAGNOSIS — I8510 Secondary esophageal varices without bleeding: Secondary | ICD-10-CM | POA: Insufficient documentation

## 2023-10-21 DIAGNOSIS — Z85118 Personal history of other malignant neoplasm of bronchus and lung: Secondary | ICD-10-CM | POA: Diagnosis not present

## 2023-10-21 DIAGNOSIS — K31819 Angiodysplasia of stomach and duodenum without bleeding: Secondary | ICD-10-CM | POA: Diagnosis not present

## 2023-10-21 DIAGNOSIS — D649 Anemia, unspecified: Secondary | ICD-10-CM | POA: Insufficient documentation

## 2023-10-21 DIAGNOSIS — Z86718 Personal history of other venous thrombosis and embolism: Secondary | ICD-10-CM | POA: Insufficient documentation

## 2023-10-21 HISTORY — PX: HOT HEMOSTASIS: SHX5433

## 2023-10-21 HISTORY — PX: ESOPHAGEAL BANDING: SHX5518

## 2023-10-21 HISTORY — PX: ESOPHAGOGASTRODUODENOSCOPY (EGD) WITH PROPOFOL: SHX5813

## 2023-10-21 SURGERY — ESOPHAGOGASTRODUODENOSCOPY (EGD) WITH PROPOFOL
Anesthesia: Monitor Anesthesia Care

## 2023-10-21 MED ORDER — ACETAMINOPHEN 500 MG PO TABS
1000.0000 mg | ORAL_TABLET | Freq: Once | ORAL | Status: AC
  Administered 2023-10-21: 1000 mg via ORAL

## 2023-10-21 MED ORDER — LIDOCAINE HCL (CARDIAC) PF 100 MG/5ML IV SOSY
PREFILLED_SYRINGE | INTRAVENOUS | Status: DC | PRN
  Administered 2023-10-21: 100 mg via INTRAVENOUS

## 2023-10-21 MED ORDER — ACETAMINOPHEN 500 MG PO TABS
ORAL_TABLET | ORAL | Status: AC
Start: 2023-10-21 — End: ?
  Filled 2023-10-21: qty 2

## 2023-10-21 MED ORDER — GLYCOPYRROLATE 0.2 MG/ML IJ SOLN
INTRAMUSCULAR | Status: DC | PRN
  Administered 2023-10-21: .1 mg via INTRAVENOUS

## 2023-10-21 MED ORDER — SODIUM CHLORIDE 0.9 % IV SOLN
INTRAVENOUS | Status: DC | PRN
Start: 2023-10-21 — End: 2023-10-21

## 2023-10-21 MED ORDER — PROPOFOL 10 MG/ML IV BOLUS
INTRAVENOUS | Status: DC | PRN
  Administered 2023-10-21: 60 mg via INTRAVENOUS
  Administered 2023-10-21: 30 mg via INTRAVENOUS
  Administered 2023-10-21: 60 mg via INTRAVENOUS
  Administered 2023-10-21 (×2): 50 mg via INTRAVENOUS

## 2023-10-21 SURGICAL SUPPLY — 15 items

## 2023-10-21 NOTE — Op Note (Signed)
[facility] Patient Name: Nicole [name] Procedure Date: 10/21/2023 MRN: [id number] Attending MD: Kerin Salen , MD, [id number] Date of Birth: [birth year] CSN: [id number] Age: 67 Admit Type: Outpatient Procedure:                Upper GI endoscopy Indications:              Follow-up of esophageal varices, history of GAVE                            needing RFA Providers:                Kerin Salen, MD, Jacquelyn "Jaci" Clelia Croft, RN, Salley Scarlet, Technician, Yvonne Kendall Parsons,CRNA Referring MD:             Candie Mile Medicines:                Monitored Anesthesia Care Complications:            No immediate complications. Estimated Blood Loss:     Estimated blood loss: none. Procedure:                Pre-Anesthesia Assessment:                           - Prior to the procedure, a History and Physical                            was performed, and patient medications and                            allergies were reviewed. The patient's tolerance of                            previous anesthesia was also reviewed. The risks                            and benefits of the procedure and the sedation                            options and risks were discussed with the patient.                            All questions were answered, and informed consent                            was obtained. Prior Anticoagulants: The patient has                            taken Eliquis (apixaban), last dose was 4 days                            prior to procedure. ASA Grade Assessment: III - A  patient with severe systemic disease. After                            reviewing the risks and benefits, the patient was                            deemed in satisfactory condition to undergo the                            procedure.                           After obtaining informed consent, the endoscope was                            passed under  direct vision. Throughout the                            procedure, the patient's blood pressure, pulse, and                            oxygen saturations were monitored continuously. The                            [id number]-H190 ([id number]) Olympus endoscope was introduced                            through the mouth, and advanced to the second part                            of duodenum. The upper GI endoscopy was                            accomplished without difficulty. The patient                            tolerated the procedure well. Scope In: Scope Out: Findings:      Grade II varices were found in the middle third of the esophagus and in       the lower third of the esophagus. They were medium in size. Three bands       were successfully placed with complete eradication, resulting in       deflation of varices. There was no bleeding during and at the end of the       procedure.      Mild, localized gastric antral vascular ectasia without bleeding was       present in the gastric antrum. Coagulation for tissue destruction using       argon plasma at 0.8 liters/minute and 20 watts was successful.      Moderate portal hypertensive gastropathy was found in the entire       examined stomach.      Patchy moderately erythematous mucosa without active bleeding and with       no stigmata of bleeding was found in the duodenal bulb, in the first       portion of the duodenum and in  the second portion of the duodenum. Impression:               - Grade II esophageal varices. Completely                            eradicated. Banded.                           - Gastric antral vascular ectasia without bleeding.                            Treated with argon plasma coagulation (APC).                           - Portal hypertensive gastropathy.                           - Erythematous duodenopathy.                           - No specimens collected. Moderate Sedation:      Patient did not receive  moderate sedation for this procedure, but       instead received monitored anesthesia care. Recommendation:           - Patient has a contact number available for                            emergencies. The signs and symptoms of potential                            delayed complications were discussed with the                            patient. Return to normal activities tomorrow.                            Written discharge instructions were provided to the                            patient.                           - Clear liquid diet.                           - Continue present medications.                           - Repeat upper endoscopy in 1 year for retreatment. Procedure Code(s):        --- Professional ---                           [phone], Esophagogastroduodenoscopy, flexible,                            transoral; with band ligation of esophageal/gastric  varices                           43270, Esophagogastroduodenoscopy, flexible,                            transoral; with ablation of tumor(s), polyp(s), or                            other lesion(s) (includes pre- and post-dilation                            and guide wire passage, when performed) Diagnosis Code(s):        --- Professional ---                           I85.00, Esophageal varices without bleeding                           K31.819, Angiodysplasia of stomach and duodenum                            without bleeding                           K76.6, Portal hypertension                           K31.89, Other diseases of stomach and duodenum CPT copyright 2022 American Medical Association. All rights reserved. The codes documented in this report are preliminary and upon coder review may  be revised to meet current compliance requirements. Kerin Salen, MD 10/21/2023 10:31:26 AM This report has been signed electronically. Number of Addenda: 0

## 2023-10-21 NOTE — Anesthesia Postprocedure Evaluation (Signed)
Anesthesia Post Note  Patient: Nicole [name]  Procedure(s) Performed: ESOPHAGOGASTRODUODENOSCOPY (EGD) WITH PROPOFOL HOT HEMOSTASIS (ARGON PLASMA COAGULATION/BICAP) ESOPHAGEAL BANDING     Patient location during evaluation: PACU Anesthesia Type: MAC Level of consciousness: awake and alert Pain management: pain level controlled Vital Signs Assessment: post-procedure vital signs reviewed and stable Respiratory status: spontaneous breathing, nonlabored ventilation and respiratory function stable Cardiovascular status: blood pressure returned to baseline Postop Assessment: no apparent nausea or vomiting Anesthetic complications: no   No notable events documented.  Last Vitals:  Vitals:   10/21/23 1100 10/21/23 1110  BP: (!) 106/50 (!) 140/63  Pulse: 69 67  Resp: 11 12  Temp:    SpO2: 100% 100%    Last Pain:  Vitals:   10/21/23 1110  TempSrc:   PainSc: 4                  Shanda Howells

## 2023-10-21 NOTE — Discharge Instructions (Addendum)
You may start your Eliquis again tomorrow (10/22/23). You may have clear liquids until 5 PM today, at which time you can progress back to normal food.  YOU HAD AN ENDOSCOPIC PROCEDURE TODAY: Refer to the procedure report and other information in the discharge instructions given to you for any specific questions about what was found during the examination. If this information does not answer your questions, please call the [facility] office at [phone] to clarify.   YOU SHOULD EXPECT: Some feelings of bloating in the abdomen. Passage of more gas than usual. Walking can help get rid of the air that was put into your GI tract during the procedure and reduce the bloating.   DIET: Your first meal following the procedure should be a light meal and then it is ok to progress to your normal diet. A half-sandwich or bowl of soup is an example of a good first meal. Heavy or fried foods are harder to digest and may make you feel nauseous or bloated. Drink plenty of fluids but you should avoid alcoholic beverages for 24 hours.   ACTIVITY: Your care partner should take you home directly after the procedure. You should plan to take it easy, moving slowly for the rest of the day. You can resume normal activity the day after the procedure however YOU SHOULD NOT DRIVE, use power tools, machinery or perform tasks that involve climbing or major physical exertion for 24 hours (because of the sedation medicines used during the test).   SYMPTOMS TO REPORT IMMEDIATELY: A gastroenterologist can be reached at any hour. Please call [phone]  for any of the following symptoms:   Following upper endoscopy (EGD, EUS, ERCP, esophageal dilation) Vomiting of blood or coffee ground material  New, significant abdominal pain  New, significant chest pain or pain under the shoulder blades  Painful or persistently difficult swallowing  New shortness of breath  Black, tarry-looking or red, bloody stools  FOLLOW UP:  If any  biopsies were taken you will be contacted by phone or by letter within the next 1-3 weeks. Call [phone]  if you have not heard about the biopsies in 3 weeks.  Please also call with any specific questions about appointments or follow up tests.

## 2023-10-21 NOTE — Anesthesia Preprocedure Evaluation (Addendum)
Anesthesia Evaluation  Patient identified by MRN, date of birth, ID band Patient awake    Reviewed: Allergy & Precautions, NPO status , Patient's Chart, lab work & pertinent test results, reviewed documented beta blocker date and time   History of Anesthesia Complications Negative for: history of anesthetic complications  Airway Mallampati: II  TM Distance: >3 FB Neck ROM: Full    Dental no notable dental hx.    Pulmonary  S/p lobectomy   Pulmonary exam normal        Cardiovascular hypertension, Pt. on home beta blockers + Peripheral Vascular Disease  Normal cardiovascular exam     Neuro/Psych  Headaches   Depression       GI/Hepatic ,GERD  Medicated,,(+) Cirrhosis   Esophageal Varices      Endo/Other  negative endocrine ROS    Renal/GU negative Renal ROS     Musculoskeletal  (+) Arthritis ,    Abdominal   Peds  Hematology  (+) Blood dyscrasia, anemia   Anesthesia Other Findings Day of surgery medications reviewed with patient.  Reproductive/Obstetrics                             Anesthesia Physical Anesthesia Plan  ASA: 3  Anesthesia Plan: MAC   Post-op Pain Management: Minimal or no pain anticipated   Induction:   PONV Risk Score and Plan: 2 and Treatment may vary due to age or medical condition and Propofol infusion  Airway Management Planned: Natural Airway and Nasal Cannula  Additional Equipment: None  Intra-op Plan:   Post-operative Plan:   Informed Consent: I have reviewed the patients History and Physical, chart, labs and discussed the procedure including the risks, benefits and alternatives for the proposed anesthesia with the patient or authorized representative who has indicated his/her understanding and acceptance.       Plan Discussed with: CRNA  Anesthesia Plan Comments:         Anesthesia Quick Evaluation

## 2023-10-21 NOTE — Anesthesia Procedure Notes (Signed)
Date/Time: 10/21/2023 10:04 AM  Performed by: Floydene Flock, CRNA

## 2023-10-21 NOTE — Interval H&P Note (Signed)
History and Physical Interval Note: 66/female with esophageal varices, ?GAVE requiring RFA for EGD with possible banding and APC if needed with propofol.  10/21/2023 9:40 AM  [name]  has presented today for EGD with possible banding and APC if needed with propofol, with the diagnosis of GAVE, esophageal varicies, and Cirrhosis of the liver.  The various methods of treatment have been discussed with the patient and family. After consideration of risks, benefits and other options for treatment, the patient has consented to  Procedure(s) with comments: ESOPHAGOGASTRODUODENOSCOPY (EGD) WITH PROPOFOL (N/A) - with banding and APC as a surgical intervention.  The patient's history has been reviewed, patient examined, no change in status, stable for surgery.  I have reviewed the patient's chart and labs.  Questions were answered to the patient's satisfaction.     Kerin Salen

## 2023-10-21 NOTE — Transfer of Care (Signed)
Immediate Anesthesia Transfer of Care Note  Patient: Nicole [name]  Procedure(s) Performed: ESOPHAGOGASTRODUODENOSCOPY (EGD) WITH PROPOFOL HOT HEMOSTASIS (ARGON PLASMA COAGULATION/BICAP) ESOPHAGEAL BANDING  Patient Location: PACU  Anesthesia Type:MAC  Level of Consciousness: drowsy  Airway & Oxygen Therapy: pt on room air breathing spont.  Post-op Assessment: still very drowsy, vital signs stable  Post vital signs: vital signs stable, report to RN  Last Vitals:  Vitals Value Taken Time  BP    Temp    Pulse    Resp 19 10/21/23 1039  SpO2    Vitals shown include unfiled device data.  Last Pain:  Vitals:   10/21/23 0933  TempSrc: Temporal  PainSc: 0-No pain         Complications: No notable events documented.

## 2023-10-22 NOTE — Progress Notes (Signed)
Patient reported having nausea for about x3 hours after an afternoon meal yesterday. It did disappear after x3 hours. Patient was advised to call Dr. Laurey Morale office if the nausea returns and or if she is not having bowel movements.

## 2023-10-24 ENCOUNTER — Encounter (HOSPITAL_COMMUNITY): Payer: Self-pay | Admitting: Gastroenterology

## 2023-10-30 ENCOUNTER — Ambulatory Visit (HOSPITAL_COMMUNITY): Payer: Medicare Other

## 2023-10-30 ENCOUNTER — Encounter (HOSPITAL_COMMUNITY): Payer: Self-pay

## 2023-11-05 ENCOUNTER — Encounter: Payer: Self-pay | Admitting: Hematology and Oncology

## 2023-11-06 ENCOUNTER — Inpatient Hospital Stay: Payer: Medicare Other | Attending: Hematology and Oncology

## 2023-11-06 DIAGNOSIS — C7802 Secondary malignant neoplasm of left lung: Secondary | ICD-10-CM | POA: Diagnosis not present

## 2023-11-06 DIAGNOSIS — C50411 Malignant neoplasm of upper-outer quadrant of right female breast: Secondary | ICD-10-CM | POA: Diagnosis not present

## 2023-11-06 DIAGNOSIS — C7801 Secondary malignant neoplasm of right lung: Secondary | ICD-10-CM | POA: Insufficient documentation

## 2023-11-06 DIAGNOSIS — C778 Secondary and unspecified malignant neoplasm of lymph nodes of multiple regions: Secondary | ICD-10-CM | POA: Diagnosis not present

## 2023-11-06 DIAGNOSIS — Z1721 Progesterone receptor positive status: Secondary | ICD-10-CM | POA: Diagnosis not present

## 2023-11-06 DIAGNOSIS — Z5111 Encounter for antineoplastic chemotherapy: Secondary | ICD-10-CM | POA: Insufficient documentation

## 2023-11-06 DIAGNOSIS — M810 Age-related osteoporosis without current pathological fracture: Secondary | ICD-10-CM

## 2023-11-06 DIAGNOSIS — Z170 Estrogen receptor positive status [ER+]: Secondary | ICD-10-CM | POA: Diagnosis not present

## 2023-11-06 MED ORDER — FULVESTRANT 250 MG/5ML IM SOSY
500.0000 mg | PREFILLED_SYRINGE | Freq: Once | INTRAMUSCULAR | Status: AC
  Administered 2023-11-06: 500 mg via INTRAMUSCULAR
  Filled 2023-11-06: qty 10

## 2023-11-11 ENCOUNTER — Telehealth: Payer: Self-pay | Admitting: Pharmacy Technician

## 2023-11-11 ENCOUNTER — Other Ambulatory Visit (HOSPITAL_COMMUNITY): Payer: Self-pay

## 2023-11-11 ENCOUNTER — Encounter: Payer: Self-pay | Admitting: Hematology and Oncology

## 2023-11-11 NOTE — Telephone Encounter (Signed)
Oral Oncology Patient Advocate Encounter   Was successful in securing patient a $6,000 grant from [employer] to provide copayment coverage for Ibrance.  This will keep the out of pocket expense at $0.     I have spoken with the patient.    The billing information is as follows and has been shared with [facility].   Member ID: [id number] Group ID: [id number] RxBin: [id number] PCN: [id number] Dates of Eligibility: 11/11/23 through 11/10/24  Fund name:  [employer].   Jinger Neighbors, CPhT-Adv Oncology Pharmacy Patient Advocate [facility] Direct Number: [phone]  Fax: [phone]

## 2023-11-13 ENCOUNTER — Other Ambulatory Visit (HOSPITAL_COMMUNITY): Payer: Self-pay

## 2023-11-13 ENCOUNTER — Telehealth: Payer: Self-pay | Admitting: Pharmacy Technician

## 2023-11-13 NOTE — Telephone Encounter (Signed)
Oral Oncology Patient Advocate Encounter  Prior Authorization for Ilda Foil has been approved.    PA# [id number] Effective dates: 11/13/23 through 11/12/24  Patients co-pay is $1,478.31.   Patient is enrolled in PAP until 12/30/23. They will transition to filling at [facility] using [employer] grant funding to cover the copay 12/31/23.  Jinger Neighbors, CPhT-Adv Oncology Pharmacy Patient Advocate [facility] Direct Number: [phone]  Fax: [phone],

## 2023-11-13 NOTE — Telephone Encounter (Signed)
Oral Oncology Patient Advocate Encounter   Received notification that prior authorization for Ilda Foil is due for renewal.   PA submitted on 11/13/23 Key [id number] Status is pending     Jinger Neighbors, CPhT-Adv Oncology Pharmacy Patient Advocate [facility] Direct Number: [phone]  Fax: [phone]

## 2023-11-24 ENCOUNTER — Other Ambulatory Visit: Payer: Self-pay | Admitting: Hematology and Oncology

## 2023-11-24 ENCOUNTER — Ambulatory Visit (HOSPITAL_COMMUNITY): Payer: Medicare Other

## 2023-11-24 DIAGNOSIS — D500 Iron deficiency anemia secondary to blood loss (chronic): Secondary | ICD-10-CM

## 2023-11-24 DIAGNOSIS — C50911 Malignant neoplasm of unspecified site of right female breast: Secondary | ICD-10-CM

## 2023-11-24 NOTE — Progress Notes (Signed)
Pt called stating that she was weak and having SOB. She feels that her Hgb could be low. Pt declined coming in today for labs. She stated she can come in 11/26 for labs at 10 am. Lab appt was made for 11/26 at 10am. Pt verbalized understanding.

## 2023-11-25 ENCOUNTER — Other Ambulatory Visit: Payer: Self-pay | Admitting: *Deleted

## 2023-11-25 ENCOUNTER — Other Ambulatory Visit: Payer: Medicare Other

## 2023-11-25 DIAGNOSIS — C50911 Malignant neoplasm of unspecified site of right female breast: Secondary | ICD-10-CM

## 2023-11-25 DIAGNOSIS — I8511 Secondary esophageal varices with bleeding: Secondary | ICD-10-CM

## 2023-11-25 DIAGNOSIS — D649 Anemia, unspecified: Secondary | ICD-10-CM

## 2023-11-25 DIAGNOSIS — Z5111 Encounter for antineoplastic chemotherapy: Secondary | ICD-10-CM | POA: Diagnosis not present

## 2023-11-25 DIAGNOSIS — D500 Iron deficiency anemia secondary to blood loss (chronic): Secondary | ICD-10-CM

## 2023-11-25 LAB — CBC WITH DIFFERENTIAL (CANCER CENTER ONLY)
Abs Immature Granulocytes: 0.02 10*3/uL (ref 0.00–0.07)
Basophils Absolute: 0 10*3/uL (ref 0.0–0.1)
Basophils Relative: 1 %
Eosinophils Absolute: 0 10*3/uL (ref 0.0–0.5)
Eosinophils Relative: 1 %
HCT: 24.7 % — ABNORMAL LOW (ref 36.0–46.0)
Hemoglobin: 7.8 g/dL — ABNORMAL LOW (ref 12.0–15.0)
Immature Granulocytes: 1 %
Lymphocytes Relative: 12 %
Lymphs Abs: 0.3 10*3/uL — ABNORMAL LOW (ref 0.7–4.0)
MCH: 32.5 pg (ref 26.0–34.0)
MCHC: 31.6 g/dL (ref 30.0–36.0)
MCV: 102.9 fL — ABNORMAL HIGH (ref 80.0–100.0)
Monocytes Absolute: 0.2 10*3/uL (ref 0.1–1.0)
Monocytes Relative: 9 %
Neutro Abs: 1.6 10*3/uL — ABNORMAL LOW (ref 1.7–7.7)
Neutrophils Relative %: 76 %
Platelet Count: 78 10*3/uL — ABNORMAL LOW (ref 150–400)
RBC: 2.4 MIL/uL — ABNORMAL LOW (ref 3.87–5.11)
RDW: 20.4 % — ABNORMAL HIGH (ref 11.5–15.5)
Smear Review: DECREASED
WBC Count: 2.3 10*3/uL — ABNORMAL LOW (ref 4.0–10.5)
nRBC: 0 % (ref 0.0–0.2)

## 2023-11-25 LAB — SAMPLE TO BLOOD BANK

## 2023-11-26 ENCOUNTER — Inpatient Hospital Stay: Payer: Medicare Other

## 2023-11-26 DIAGNOSIS — Z5111 Encounter for antineoplastic chemotherapy: Secondary | ICD-10-CM | POA: Diagnosis not present

## 2023-11-26 DIAGNOSIS — I8511 Secondary esophageal varices with bleeding: Secondary | ICD-10-CM

## 2023-11-26 DIAGNOSIS — D649 Anemia, unspecified: Secondary | ICD-10-CM

## 2023-11-26 LAB — PREPARE RBC (CROSSMATCH)

## 2023-11-26 MED ORDER — ACETAMINOPHEN 325 MG PO TABS
650.0000 mg | ORAL_TABLET | Freq: Once | ORAL | Status: AC
  Administered 2023-11-26: 650 mg via ORAL
  Filled 2023-11-26: qty 2

## 2023-11-26 MED ORDER — DIPHENHYDRAMINE HCL 25 MG PO CAPS
25.0000 mg | ORAL_CAPSULE | Freq: Once | ORAL | Status: AC
  Administered 2023-11-26: 25 mg via ORAL
  Filled 2023-11-26: qty 1

## 2023-11-26 MED ORDER — SODIUM CHLORIDE 0.9% IV SOLUTION
250.0000 mL | INTRAVENOUS | Status: DC
  Administered 2023-11-26: 100 mL via INTRAVENOUS

## 2023-11-26 NOTE — Patient Instructions (Signed)
Blood Transfusion, Adult A blood transfusion is a procedure in which you receive blood or a type of blood cell (blood component) through an IV. You may need a blood transfusion when you have a low blood count, which is a low number of any blood cell. This may result from a bleeding disorder, illness, injury, or surgery. The blood may come from a donor, or you may be able to have your own blood collected and stored (autologous blood donation) before a planned surgery. The blood given in a transfusion may be made up of different blood components. You may receive: Red blood cells. These carry oxygen to the cells in the body. Platelets. These help your blood to clot. Plasma. This is the liquid part of your blood. It carries proteins and other substances throughout the body. White blood cells. These help you fight infections. If you have hemophilia or another clotting disorder, you may also receive other types of blood products. Depending on the type of blood product, this procedure may take 1-4 hours to complete. Tell a health care provider about: Any bleeding problems you have. Any previous reactions you have had during a blood transfusion. Any allergies you have. All medicines you are taking, including vitamins, herbs, eye drops, creams, and over-the-counter medicines. Any surgeries you have had. Any medical conditions you have. Whether you are pregnant or may be pregnant. What are the risks? Talk with your health care provider about risks. The most common problems include: A mild allergic reaction, such as red, swollen areas of skin (hives) and itching. Fever or chills. This may be the body's response to new blood cells received. This may occur during or up to 4 hours after the transfusion. More serious problems may include: A serious allergic reaction that causes difficulty breathing or swelling around the face and lips. Transfusion-associated circulatory overload (TACO), or too much fluid in  the lungs. This may cause breathing problems. Transfusion-related acute lung injury (TRALI), which causes breathing difficulty and low oxygen in the blood. This can occur within hours of the transfusion or several days later. Iron overload. This can happen after receiving many blood transfusions over a period of time. Infection or virus being transmitted. This is rare because donated blood is carefully tested before it is given. Hemolytic transfusion reaction. This is rare. It happens when the body's defense system (immune system)tries to attack the new blood cells. Symptoms may include fever, chills, nausea, low blood pressure, and low back or chest pain. Transfusion-associated graft-versus-host disease (TAGVHD). This is rare. It happens when donated cells attack the body's healthy tissues. What happens before the procedure? You will have a blood test to check your blood type. This test is done to know what kind of blood your body will accept and to match it to the donor blood. If you are going to have a planned surgery, you may be able to do an autologous blood donation. This may be done in case you need to have a transfusion. You will have your temperature, blood pressure, and pulse checked before the transfusion. If you have had an allergic reaction to a transfusion in the past, you may be given medicine to help prevent a reaction. This medicine may be given to you by mouth (orally) or through an IV. What happens during the procedure?  An IV will be inserted into one of your veins. The bag of blood will be attached to your IV. The blood will then enter through your vein. Your temperature, blood pressure,   and pulse will be monitored during the transfusion. This monitoring is done to detect early signs of a transfusion reaction. Tell your nurse right away if you have any of these symptoms during the transfusion: Shortness of breath or trouble breathing. Chest or back pain. Fever or  chills. Itching or hives. If you have any signs or symptoms of a reaction, your transfusion will be stopped and you may be given medicine. When the transfusion is complete, your IV will be removed. Pressure may be applied to the IV site for a few minutes. A bandage (dressing)will be applied. The procedure may vary among health care providers and hospitals. What happens after the procedure? Your temperature, blood pressure, pulse, breathing rate, and blood oxygen level will be monitored until you leave the hospital or clinic. Your blood may be tested to see how you have responded to the transfusion. You may be warmed with fluids or blankets to maintain a normal body temperature. If you receive your blood transfusion in an outpatient setting, you will be told whom to contact to report any reactions. Where to find more information Visit the [employer]: redcross.org Summary A blood transfusion is a procedure in which you receive blood or a type of blood cell (blood component) through an IV. The blood given in a transfusion may be made up of different blood components. You may receive red blood cells, platelets, plasma, or white blood cells depending on the condition treated. Your temperature, blood pressure, and pulse will be monitored before, during, and after the transfusion. After the transfusion, your blood may be tested to see how your body has responded. This information is not intended to replace advice given to you by your health care provider. Make sure you discuss any questions you have with your health care provider. Document Revised: 03/15/2022 Document Reviewed: 03/15/2022 [employer] Patient Education  2024 [employer]  

## 2023-11-28 ENCOUNTER — Other Ambulatory Visit: Payer: Self-pay

## 2023-11-28 LAB — TYPE AND SCREEN
ABO/RH(D): O POS
Antibody Screen: POSITIVE
Donor AG Type: NEGATIVE
Unit division: 0

## 2023-11-28 LAB — BPAM RBC
Blood Product Expiration Date: 202412272359
ISSUE DATE / TIME: 202411270807
Unit Type and Rh: 5100

## 2023-11-29 ENCOUNTER — Ambulatory Visit (HOSPITAL_COMMUNITY)
Admission: RE | Admit: 2023-11-29 | Discharge: 2023-11-29 | Disposition: A | Discharge status: Home / Self Care | Payer: Medicare Other | Source: Ambulatory Visit | Attending: Hematology and Oncology | Admitting: Hematology and Oncology

## 2023-11-29 DIAGNOSIS — C7800 Secondary malignant neoplasm of unspecified lung: Secondary | ICD-10-CM | POA: Insufficient documentation

## 2023-11-29 DIAGNOSIS — C50911 Malignant neoplasm of unspecified site of right female breast: Secondary | ICD-10-CM | POA: Insufficient documentation

## 2023-12-02 ENCOUNTER — Ambulatory Visit: Payer: Medicare Other | Admitting: Adult Health

## 2023-12-02 ENCOUNTER — Other Ambulatory Visit: Payer: Medicare Other

## 2023-12-04 ENCOUNTER — Inpatient Hospital Stay: Payer: Medicare Other | Attending: Hematology and Oncology

## 2023-12-04 ENCOUNTER — Inpatient Hospital Stay: Payer: Medicare Other

## 2023-12-04 ENCOUNTER — Other Ambulatory Visit: Payer: Self-pay

## 2023-12-04 ENCOUNTER — Inpatient Hospital Stay (HOSPITAL_BASED_OUTPATIENT_CLINIC_OR_DEPARTMENT_OTHER): Payer: Medicare Other | Admitting: Hematology and Oncology

## 2023-12-04 VITALS — BP 109/71 | HR 105 | Temp 97.5°F | Resp 15 | Wt 122.7 lb

## 2023-12-04 DIAGNOSIS — Z170 Estrogen receptor positive status [ER+]: Secondary | ICD-10-CM | POA: Insufficient documentation

## 2023-12-04 DIAGNOSIS — C7801 Secondary malignant neoplasm of right lung: Secondary | ICD-10-CM | POA: Insufficient documentation

## 2023-12-04 DIAGNOSIS — C778 Secondary and unspecified malignant neoplasm of lymph nodes of multiple regions: Secondary | ICD-10-CM | POA: Insufficient documentation

## 2023-12-04 DIAGNOSIS — M810 Age-related osteoporosis without current pathological fracture: Secondary | ICD-10-CM

## 2023-12-04 DIAGNOSIS — D649 Anemia, unspecified: Secondary | ICD-10-CM

## 2023-12-04 DIAGNOSIS — C7802 Secondary malignant neoplasm of left lung: Secondary | ICD-10-CM | POA: Insufficient documentation

## 2023-12-04 DIAGNOSIS — I8511 Secondary esophageal varices with bleeding: Secondary | ICD-10-CM | POA: Diagnosis not present

## 2023-12-04 DIAGNOSIS — C50411 Malignant neoplasm of upper-outer quadrant of right female breast: Secondary | ICD-10-CM | POA: Insufficient documentation

## 2023-12-04 DIAGNOSIS — Z1721 Progesterone receptor positive status: Secondary | ICD-10-CM | POA: Diagnosis not present

## 2023-12-04 DIAGNOSIS — Z5111 Encounter for antineoplastic chemotherapy: Secondary | ICD-10-CM | POA: Diagnosis present

## 2023-12-04 LAB — CBC WITH DIFFERENTIAL/PLATELET
Abs Immature Granulocytes: 0.03 10*3/uL (ref 0.00–0.07)
Basophils Absolute: 0.1 10*3/uL (ref 0.0–0.1)
Basophils Relative: 2 %
Eosinophils Absolute: 0.1 10*3/uL (ref 0.0–0.5)
Eosinophils Relative: 2 %
HCT: 29.4 % — ABNORMAL LOW (ref 36.0–46.0)
Hemoglobin: 9.3 g/dL — ABNORMAL LOW (ref 12.0–15.0)
Immature Granulocytes: 1 %
Lymphocytes Relative: 11 %
Lymphs Abs: 0.5 10*3/uL — ABNORMAL LOW (ref 0.7–4.0)
MCH: 32.6 pg (ref 26.0–34.0)
MCHC: 31.6 g/dL (ref 30.0–36.0)
MCV: 103.2 fL — ABNORMAL HIGH (ref 80.0–100.0)
Monocytes Absolute: 0.7 10*3/uL (ref 0.1–1.0)
Monocytes Relative: 16 %
Neutro Abs: 3.2 10*3/uL (ref 1.7–7.7)
Neutrophils Relative %: 68 %
Platelets: 143 10*3/uL — ABNORMAL LOW (ref 150–400)
RBC: 2.85 MIL/uL — ABNORMAL LOW (ref 3.87–5.11)
RDW: 20.4 % — ABNORMAL HIGH (ref 11.5–15.5)
WBC: 4.6 10*3/uL (ref 4.0–10.5)
nRBC: 0 % (ref 0.0–0.2)

## 2023-12-04 LAB — COMPREHENSIVE METABOLIC PANEL
ALT: 14 U/L (ref 0–44)
AST: 18 U/L (ref 15–41)
Albumin: 4.1 g/dL (ref 3.5–5.0)
Alkaline Phosphatase: 56 U/L (ref 38–126)
Anion gap: 9 (ref 5–15)
BUN: 25 mg/dL — ABNORMAL HIGH (ref 8–23)
CO2: 25 mmol/L (ref 22–32)
Calcium: 9.5 mg/dL (ref 8.9–10.3)
Chloride: 104 mmol/L (ref 98–111)
Creatinine, Ser: 1.16 mg/dL — ABNORMAL HIGH (ref 0.44–1.00)
GFR, Estimated: 51 mL/min — ABNORMAL LOW (ref >60)
Glucose, Bld: 93 mg/dL (ref 70–99)
Potassium: 4.3 mmol/L (ref 3.5–5.1)
Sodium: 138 mmol/L (ref 135–145)
Total Bilirubin: 0.8 mg/dL (ref <1.2)
Total Protein: 6.7 g/dL (ref 6.5–8.1)

## 2023-12-04 LAB — SAMPLE TO BLOOD BANK

## 2023-12-04 MED ORDER — FULVESTRANT 250 MG/5ML IM SOSY
500.0000 mg | PREFILLED_SYRINGE | Freq: Once | INTRAMUSCULAR | Status: AC
  Administered 2023-12-04: 500 mg via INTRAMUSCULAR

## 2023-12-04 NOTE — Progress Notes (Signed)
[facility]  Telephone:([phone]) [phone] Fax:(336) [phone]    ID: Nicole [name]   DOB: 10-Apr-1956  MR#: [id number]  UYQ#:[id number]  Patient Care Team: Patient, No Pcp Per as PCP - General (General Practice) Annamarie Major, CRNP as Nurse Practitioner (Nurse Practitioner) [name], MD as Consulting Physician (General Surgery) Kerin Salen, MD as Consulting Physician (Gastroenterology)  CHIEF COMPLAINT:  Metastatic Breast Cancer (s/p right mastectomy)  CURRENT TREATMENT: Ibrance and Faslodex, prolia every 6 months.  INTERVAL HISTORY:  Discussed the use of AI scribe software for clinical note transcription with the patient, who gave verbal consent to proceed.  History of Present Illness    The patient, with a history of breast cancer, cirrhosis, and portal vein thrombosis, presents with generalized weakness, shakiness, and nausea. She describes her legs as feeling heavy, like they weigh fifty pounds each, and she experiences nausea upon standing. These symptoms have been ongoing since a blood transfusion last week. She denies any bleeding and has not noticed any black stools. She is currently on Eliquis due to a history of blood clots. She also receives injections for her breast cancer treatment. She has not yet received the COVID-19 vaccine. Ibrance held since the last blood counts.  Rest of the pertinent 10 point ROS reviewed and negative   COVID 19 VACCINATION STATUS: Status post Pfizer x2+ booster August 2021   BREAST CANCER HISTORY: From Dr. Theron Arista Rubin's 07/25/2010 note:  "This woman has not had any significant medical intervention for some time.  Her last mammogram was about seven years ago.  She noted a right breast mass about a year ago and did not seek immediate medical attention for this.  She ultimately, after some time, admitted this to her husband and self-referred herself for intervention.  She had a mammogram on 07/17/2010 with an ultrasound of the  right breast.  This showed a lobulated mass upper right outer quadrant measuring at least 6.3 x 7.3 cm and large right axillary lymph nodes were also noted.  There was skin thickening overlying the mass.  On physical exam, this mass was about an 8 cm with skin dimpling noted. Discolored area in the skin over the mass, fullness in the axilla.  Biopsy was recommended, which took place on 07/17/2010.  Pathology showed invasive ductal cancer involving both lymph node and breast.  The HER-2 was not amplified.  ER and PR both positive at 100% and 6% respectively.  Proliferative index was 12% involving both the lymph node and breast mass.  An MRI scan of both breasts was performed on 07/22/2010, essentially which showed a large heterogeneously enhancing mass of the right breast with washout kinetics measuring 6.4 x 4.4 x 5.0 cm.  Numerous satellite nodules were seen throughout the dominant mass.  There were several suspicious nodules in the upper central and upper inner quadrant of the right breast.  There were two enhancing subcentimeter nodules seen in the right pectoralis muscle.  No obvious chest wall nodules were seen; however, there was seen some metastatic adenopathy in the mediastinum as well as hila with a 2.4 x 2.0 cm mediastinal lymph node as well as 1.2 x 3.0 cm subcarinal lymph node.  There was a right hilar and probable left hilar adenopathy.  Bilateral pulmonary nodules were seen and a right T2 lesion was seen anterior right liver as well."  Her subsequent history is as detailed below.   PAST MEDICAL HISTORY: Past Medical History:  Diagnosis Date   Anemia  low iron   Arthritis    fingers   Blood transfusion 2012   Breast CA (HCC)    breast ca dx 06/2010, stage 4, right , Lung Metasis.Surgery, Chemo, Radiation   Breast cancer (HCC)    Clot    jugular  ?2015   Depression    Esophageal varices (HCC)    Headache    Mirgraine- rare since menopause   Heart murmur    mild, no cardiologist,  from birth   Hematemesis 08/2017   History of kidney stones    no pain- shows in CT- small 1 mm bilaterally- kidney   Hypertension    IBD (inflammatory bowel disease)    resolved   Idiopathic cirrhosis (HCC)    pt denies   Peripheral vascular disease (HCC) few yrs ago   clot in right jugular   Portal hypertension (HCC)    Radiation 07/21/12-09/07/12   5940 cGy   Restless legs     PAST SURGICAL HISTORY: Past Surgical History:  Procedure Laterality Date   BIOPSY  08/21/2020   Procedure: BIOPSY;  Surgeon: Kerin Salen, MD;  Location: [facility] ENDOSCOPY;  Service: Gastroenterology;;   COLONOSCOPY  2016   DILATION AND CURETTAGE OF UTERUS  2004?   ESOPHAGEAL BANDING  09/04/2017   Procedure: ESOPHAGEAL BANDING;  Surgeon: Kerin Salen, MD;  Location: [facility] ENDOSCOPY;  Service: Gastroenterology;;   ESOPHAGEAL BANDING  11/27/2017   Procedure: ESOPHAGEAL BANDING;  Surgeon: Kerin Salen, MD;  Location: [facility] ENDOSCOPY;  Service: Gastroenterology;;   ESOPHAGEAL BANDING N/A 04/28/2018   Procedure: ESOPHAGEAL BANDING;  Surgeon: Kerin Salen, MD;  Location: [facility];  Service: Gastroenterology;  Laterality: N/A;   ESOPHAGEAL BANDING  11/17/2018   Procedure: ESOPHAGEAL BANDING;  Surgeon: Kerin Salen, MD;  Location: [facility] ENDOSCOPY;  Service: Gastroenterology;;   ESOPHAGEAL BANDING  08/16/2019   Procedure: ESOPHAGEAL BANDING;  Surgeon: Kerin Salen, MD;  Location: [facility] ENDOSCOPY;  Service: Gastroenterology;;   ESOPHAGEAL BANDING N/A 08/21/2020   Procedure: ESOPHAGEAL BANDING;  Surgeon: Kerin Salen, MD;  Location: [facility];  Service: Gastroenterology;  Laterality: N/A;   ESOPHAGEAL BANDING  10/21/2023   Procedure: ESOPHAGEAL BANDING;  Surgeon: Kerin Salen, MD;  Location: [facility];  Service: Gastroenterology;;   ESOPHAGOGASTRODUODENOSCOPY N/A 04/28/2018   Procedure: ESOPHAGOGASTRODUODENOSCOPY (EGD);  Surgeon: Kerin Salen, MD;  Location: [facility] ENDOSCOPY;  Service: Gastroenterology;  Laterality: N/A;    ESOPHAGOGASTRODUODENOSCOPY N/A 11/17/2018   Procedure: ESOPHAGOGASTRODUODENOSCOPY (EGD);  Surgeon: Kerin Salen, MD;  Location: [facility] ENDOSCOPY;  Service: Gastroenterology;  Laterality: N/A;   ESOPHAGOGASTRODUODENOSCOPY N/A 08/13/2022   Procedure: ESOPHAGOGASTRODUODENOSCOPY (EGD);  Surgeon: Kerin Salen, MD;  Location: [facility] ENDOSCOPY;  Service: Gastroenterology;  Laterality: N/A;   ESOPHAGOGASTRODUODENOSCOPY (EGD) WITH PROPOFOL N/A 04/11/2016   Procedure: ESOPHAGOGASTRODUODENOSCOPY (EGD) WITH PROPOFOL;  Surgeon: Vida Rigger, MD;  Location: [facility];  Service: Endoscopy;  Laterality: N/A;   ESOPHAGOGASTRODUODENOSCOPY (EGD) WITH PROPOFOL N/A 07/08/2016   Procedure: ESOPHAGOGASTRODUODENOSCOPY (EGD) WITH PROPOFOL;  Surgeon: Carman Ching, MD;  Location: [facility] ENDOSCOPY;  Service: Endoscopy;  Laterality: N/A;   ESOPHAGOGASTRODUODENOSCOPY (EGD) WITH PROPOFOL N/A 03/03/2017   Procedure: ESOPHAGOGASTRODUODENOSCOPY (EGD) WITH PROPOFOL;  Surgeon: Carman Ching, MD;  Location: [facility] ENDOSCOPY;  Service: Endoscopy;  Laterality: N/A;   ESOPHAGOGASTRODUODENOSCOPY (EGD) WITH PROPOFOL N/A 09/04/2017   Procedure: ESOPHAGOGASTRODUODENOSCOPY (EGD) WITH PROPOFOL;  Surgeon: Kerin Salen, MD;  Location: [facility] ENDOSCOPY;  Service: Gastroenterology;  Laterality: N/A;   ESOPHAGOGASTRODUODENOSCOPY (EGD) WITH PROPOFOL N/A 11/27/2017   Procedure: ESOPHAGOGASTRODUODENOSCOPY (EGD);  Surgeon: Kerin Salen, MD;  Location: [facility] ENDOSCOPY;  Service: Gastroenterology;  Laterality: N/A;   ESOPHAGOGASTRODUODENOSCOPY (EGD) WITH PROPOFOL N/A 08/16/2019   Procedure: ESOPHAGOGASTRODUODENOSCOPY (EGD) WITH PROPOFOL;  Surgeon: Kerin Salen, MD;  Location: [facility];  Service: Gastroenterology;  Laterality: N/A;   ESOPHAGOGASTRODUODENOSCOPY (EGD) WITH PROPOFOL N/A 08/21/2020   Procedure: ESOPHAGOGASTRODUODENOSCOPY (EGD) WITH PROPOFOL;  Surgeon: Kerin Salen, MD;  Location: [facility];  Service: Gastroenterology;  Laterality: N/A;   ESOPHAGOGASTRODUODENOSCOPY (EGD)  WITH PROPOFOL N/A 08/24/2021   Procedure: ESOPHAGOGASTRODUODENOSCOPY (EGD) WITH PROPOFOL;  Surgeon: Kerin Salen, MD;  Location: [facility];  Service: Gastroenterology;  Laterality: N/A;   ESOPHAGOGASTRODUODENOSCOPY (EGD) WITH PROPOFOL N/A 06/25/2023   Procedure: ESOPHAGOGASTRODUODENOSCOPY (EGD) WITH PROPOFOL;  Surgeon: Vida Rigger, MD;  Location: [facility];  Service: Gastroenterology;  Laterality: N/A;  W. bandging   ESOPHAGOGASTRODUODENOSCOPY (EGD) WITH PROPOFOL N/A 10/21/2023   Procedure: ESOPHAGOGASTRODUODENOSCOPY (EGD) WITH PROPOFOL;  Surgeon: Kerin Salen, MD;  Location: [facility];  Service: Gastroenterology;  Laterality: N/A;  with banding and APC   GASTRIC VARICES BANDING N/A 08/24/2021   Procedure: GASTRIC VARICES BANDING;  Surgeon: Kerin Salen, MD;  Location: [facility];  Service: Gastroenterology;  Laterality: N/A;   GASTRIC VARICES BANDING N/A 08/13/2022   Procedure: GASTRIC VARICES BANDING;  Surgeon: Kerin Salen, MD;  Location: [facility];  Service: Gastroenterology;  Laterality: N/A;   HEMOSTASIS CLIP PLACEMENT  08/21/2020   Procedure: HEMOSTASIS CLIP PLACEMENT;  Surgeon: Kerin Salen, MD;  Location: [facility];  Service: Gastroenterology;;   HOT HEMOSTASIS N/A 10/21/2023   Procedure: HOT HEMOSTASIS (ARGON PLASMA COAGULATION/BICAP);  Surgeon: Kerin Salen, MD;  Location: [facility] ENDOSCOPY;  Service: Gastroenterology;  Laterality: N/A;   INTERCOSTAL NERVE BLOCK Left 07/01/2022   Procedure: INTERCOSTAL NERVE BLOCK;  Surgeon: Loreli Slot, MD;  Location: [facility] OR;  Service: Thoracic;  Laterality: Left;   IR GENERIC HISTORICAL  05/15/2016   IR RADIOLOGIST EVAL & MGMT 05/15/2016 Gilmer Mor, DO [facility] INTERV RAD   IR GENERIC HISTORICAL  01/14/2017   IR US GUIDE VASC ACCESS RIGHT 01/14/2017 Berdine Dance, MD [facility]   IR GENERIC HISTORICAL  01/14/2017   IR VENOGRAM HEPATIC W HEMODYNAMIC EVALUATION 01/14/2017 Berdine Dance, MD [facility]   IR RADIOLOGIST EVAL & MGMT  06/12/2017    IR RADIOLOGIST EVAL & MGMT  08/11/2018   IR RADIOLOGIST EVAL & MGMT  08/25/2019   IR RADIOLOGIST EVAL & MGMT  08/22/2020   IR RADIOLOGIST EVAL & MGMT  03/11/2022   MASTECTOMY Right 06/02/12   right breast with lymph node removal   NODE DISSECTION Left 07/01/2022   Procedure: NODE DISSECTION;  Surgeon: Loreli Slot, MD;  Location: [facility] OR;  Service: Thoracic;  Laterality: Left;   PORT-A-CATH REMOVAL     PORTACATH PLACEMENT  2011   left side   RADIOFREQUENCY ABLATION  06/25/2023   Procedure: RADIO FREQUENCY ABLATION;  Surgeon: Vida Rigger, MD;  Location: [facility] ENDOSCOPY;  Service: Gastroenterology;;   RADIOLOGY WITH ANESTHESIA N/A 04/13/2016   Procedure: RADIOLOGY WITH ANESTHESIA;  Surgeon: Gilmer Mor, DO;  Location: [facility] OR;  Service: Anesthesiology;  Laterality: N/A;   VIDEO BRONCHOSCOPY WITH ENDOBRONCHIAL NAVIGATION N/A 07/01/2022   Procedure: VIDEO BRONCHOSCOPY WITH ENDOBRONCHIAL NAVIGATION;  Surgeon: Loreli Slot, MD;  Location: [facility] OR;  Service: Thoracic;  Laterality: N/A;   XI ROBOTIC ASSISTED THORACOSCOPY- SEGMENTECTOMY Left 07/01/2022   Procedure: XI ROBOTIC ASSISTED THORACOSCOPY-LEFT LOWER LOBE BASILAR SEGMENTECTOMY;  Surgeon: Loreli Slot, MD;  Location: [facility] OR;  Service: Thoracic;  Laterality: Left;    FAMILY HISTORY Family History  Problem Relation Age of Onset  COPD Mother    AAA (abdominal aortic aneurysm) Mother    COPD Father    Cancer Neg Hx   The patient's father died at the age of 35 from heart disease. Patient's mother died at the age of 43 status post CABG. The patient had no brothers, 2 sisters. There is no history of breast or ovarian cancer in the family.   GYNECOLOGIC HISTORY: Updated May 2018 Menarche age 21, she is GX P0. She does not recall when she went through menopause. She never took hormone replacement.   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 She retired in  November 2017. Husband [name] is retired from [employer]. They have no children and no pets. They attend a local [address].    ADVANCED DIRECTIVES: Not in place   HEALTH MAINTENANCE: Social History   Tobacco Use   Smoking status: Never   Smokeless tobacco: Never  Vaping Use   Vaping status: Never Used  Substance Use Topics   Alcohol use: No   Drug use: No     Colonoscopy: Never  PAP: Remote  Bone density: 09/27/2013, osteoporosis with a T score of -2.8  Lipid panel: Not on file    No Known Allergies   Current Outpatient Medications  Medication Sig Dispense Refill   denosumab (PROLIA) 60 MG/ML SOLN injection Inject 60 mg into the skin every 6 (six) months. Administer in upper arm, thigh, or abdomen     ELIQUIS 5 MG TABS tablet TAKE 1 TABLET BY MOUTH TWICE A DAY 60 tablet 2   folic acid (FOLVITE) 1 MG tablet Take 1 mg by mouth daily.     furosemide (LASIX) 20 MG tablet Take 20 mg by mouth daily.     Milk Thistle 1000 MG CAPS Take 1,000 mg by mouth daily.     palbociclib (IBRANCE) 75 MG tablet Take 1 tablet (75 mg total) by mouth daily. Take for 21 days on, 7 days off, repeat every 28 days. 21 tablet 3   pantoprazole (PROTONIX) 40 MG tablet Take 40 mg by mouth daily.     propranolol (INDERAL) 20 MG tablet Take 1 tablet (20 mg total) by mouth 2 (two) times daily. 60 tablet 0   spironolactone (ALDACTONE) 25 MG tablet Take 25 mg by mouth daily.     venlafaxine XR (EFFEXOR-XR) 150 MG 24 hr capsule Take 1 capsule (150 mg total) by mouth daily with breakfast. 90 capsule 3   No current facility-administered medications for this visit.   Facility-Administered Medications Ordered in Other Visits  Medication Dose Route Frequency Provider Last Rate Last Admin   denosumab (PROLIA) injection 60 mg  60 mg Subcutaneous Once Magrinat, Valentino Hue, MD        OBJECTIVE: white woman who appears younger than stated age  Vitals:   12/04/23 1402  BP: 109/71  Pulse: (!) 105  Resp: 15   Temp: (!) 97.5 F (36.4 C)  TempSrc: Temporal  Weight: 122 lb 11.2 oz (55.7 kg)    Physical Exam Constitutional:      Appearance: Normal appearance. She is not ill-appearing.     Comments:    Cardiovascular:     Rate and Rhythm: Normal rate and regular rhythm.     Pulses: Normal pulses.     Heart sounds: Normal heart sounds.  Pulmonary:     Effort: Pulmonary effort is normal.     Breath sounds: Normal  breath sounds.  Abdominal:     General: There is no distension.  Musculoskeletal:        General: No swelling or tenderness. Normal range of motion.     Cervical back: Normal range of motion. No rigidity.  Lymphadenopathy:     Cervical: No cervical adenopathy.  Skin:    General: Skin is warm and dry.  Neurological:     General: No focal deficit present.     Mental Status: She is alert.    LAB RESULTS:  CMP     Component Value Date/Time   NA 137 09/16/2023 1403   NA 142 11/17/2017 1355   K 4.0 09/16/2023 1403   K 4.4 11/17/2017 1355   CL 103 09/16/2023 1403   CL 109 (H) 04/20/2013 0859   CO2 26 09/16/2023 1403   CO2 22 11/17/2017 1355   GLUCOSE 100 (H) 09/16/2023 1403   GLUCOSE 86 11/17/2017 1355   GLUCOSE 91 04/20/2013 0859   BUN 24 (H) 09/16/2023 1403   BUN 12.5 11/17/2017 1355   CREATININE 1.29 (H) 09/16/2023 1403   CREATININE 1.07 (H) 09/23/2022 1327   CREATININE 1.1 11/17/2017 1355   CALCIUM 9.7 09/16/2023 1403   CALCIUM 9.2 11/17/2017 1355   PROT 7.3 09/16/2023 1403   PROT 7.0 11/17/2017 1355   ALBUMIN 4.3 09/16/2023 1403   ALBUMIN 3.8 11/17/2017 1355   AST 17 09/16/2023 1403   AST 14 (L) 09/23/2022 1327   AST 23 11/17/2017 1355   ALT 12 09/16/2023 1403   ALT 11 09/23/2022 1327   ALT 22 11/17/2017 1355   ALKPHOS 66 09/16/2023 1403   ALKPHOS 55 11/17/2017 1355   BILITOT 0.7 09/16/2023 1403   BILITOT 0.7 09/23/2022 1327   BILITOT 0.49 11/17/2017 1355   GFRNONAA 45 (L) 09/16/2023 1403   GFRNONAA 57 (L) 09/23/2022 1327   GFRNONAA 53 (L)  05/09/2016 1104   GFRAA 53 (L) 06/12/2020 1146   GFRAA 55 (L) 12/13/2019 1200   GFRAA 61 05/09/2016 1104    No results found for: "TOTALPROTELP", "ALBUMINELP", "A1GS", "A2GS", "BETS", "BETA2SER", "GAMS", "MSPIKE", "SPEI"  Lab Results  Component Value Date   WBC 2.3 (L) 11/25/2023   NEUTROABS 1.6 (L) 11/25/2023   HGB 7.8 (L) 11/25/2023   HCT 24.7 (L) 11/25/2023   MCV 102.9 (H) 11/25/2023   PLT 78 (L) 11/25/2023    Lab Results  Component Value Date   LABCA2 18 08/21/2012    No components found for: "ZOXWRU045"  No results for input(s): "INR" in the last 168 hours.  Lab Results  Component Value Date   LABCA2 18 08/21/2012    No results found for: "WUJ811"  No results found for: "CAN125"  No results found for: "BJY782"  Lab Results  Component Value Date   CA2729 26.9 12/13/2019    No components found for: "HGQUANT"  No results found for: "CEA1", "CEA" / No results found for: "CEA1", "CEA"   No results found for: "AFPTUMOR"  No results found for: "CHROMOGRNA"  No results found for: "KPAFRELGTCHN", "LAMBDASER", "KAPLAMBRATIO" (kappa/lambda light chains)  No results found for: "HGBA", "HGBA2QUANT", "HGBFQUANT", "HGBSQUAN" (Hemoglobinopathy evaluation)   Lab Results  Component Value Date   LDH 195 08/21/2012    Lab Results  Component Value Date   IRON 98 07/22/2023   TIBC 547 (H) 07/22/2023   IRONPCTSAT 18 07/22/2023   (Iron and TIBC)  Lab Results  Component Value Date   FERRITIN 12 07/22/2023    Urinalysis    Component  Value Date/Time   COLORURINE YELLOW 06/28/2022 1122   APPEARANCEUR CLEAR 06/28/2022 1122   LABSPEC 1.015 06/28/2022 1122   LABSPEC 1.010 04/29/2016 1118   PHURINE 6.0 06/28/2022 1122   GLUCOSEU NEGATIVE 06/28/2022 1122   GLUCOSEU Negative 04/29/2016 1118   HGBUR NEGATIVE 06/28/2022 1122   BILIRUBINUR NEGATIVE 06/28/2022 1122   BILIRUBINUR Negative 04/29/2016 1118   KETONESUR NEGATIVE 06/28/2022 1122   PROTEINUR NEGATIVE  06/28/2022 1122   UROBILINOGEN 0.2 04/29/2016 1118   NITRITE NEGATIVE 06/28/2022 1122   LEUKOCYTESUR NEGATIVE 06/28/2022 1122   LEUKOCYTESUR Trace 04/29/2016 1118   CBC from today shows significant improvement in hemoglobin.  White blood cell count is 3100, total neutrophil count is 2500, no thrombocytopenia  STUDIES: No results found.  COMPARISON: Abdominal ultrasound November 05, 2021   TECHNIQUE:  Real time limited abdomen ultrasound was performed.   IMAGE QUALITY: Satisfactory, of diagnostic value.   FINDINGS:   Limited sonographic views of the abdomen demonstrates mild abdominal ascites. There is no large pocket to safely drain.    IMPRESSION:  IMPRESSION:  1. Mild abdominal ascites.  2. No large enough pocket to safely drain.    ASSESSMENT: 67 y.o. Nicole [name] woman presenting June 2011 with stage IV breast cancer involving the right breast upper outer quadrant, right axilla, mediastinal lymph nodes, and both lungs, but not the brain, liver, or bones  (1) positive right breast and right axillary lymph node biopsies 07/17/2010 of a clinical T4 N2 M1 invasive ductal carcinoma, grade 1, estrogen receptor 100% positive, progesterone receptor 6% positive, with an MIB-1 of 12%, and no HER-2 amplification.  (2) participated in Phase II tessetaxel study, receiving 2 cycles complicated by thrombocytopenia, anemia requiring transfusion, transaminase elevation, and afebrile neutropenia. Off-study as of September 2011. Chest CT scan September 2011 did show evidence of response.  (3) on letrozole as of October 2011, with continuing response and good tolerance   (4) 06/02/2012 underwent right mastectomy and axillary lymph node sampling (3 lymph nodes removed, all with viable cancer as well as evidence of treatment effect) for a ypT2 ypN1-2 invasive ductal carcinoma, grade 1, 98% estrogen receptor positive, progesterone receptor negative, with no HER-2 amplification  (5) left jugular vein  DVT documented 06/16/2012; left sided Port-A-Cath removed mid July 2015; Coumadin stopped 09/07/2014.  (a) Left brachiocephalic v. slightly narrowed, with some Left lower neck collateralization noted on chest CT December 2015  (6) osteoporosis, was on alendronate, switched to denosumab/Prolia 11/07/2016  (a) bone density in 10/2018 shows osteopenia T score of -2.2  (7) portal hypertension, with splenomegaly, gastric varices, and intestinal edema  (a)  mild persistent leukopenia and thrombocytopenia likely due to splenomegaly  (b) iron deficiency secondary to above, status post Feraheme x2 December 2018  (8) restaging studies:  (a) chest CT 12/26/2017 shows a 4.5 mm left lower lobe pulmonary nodule which is unchanged.  There was no other evidence of active disease  (b) restaging chest CT in 05/2019 shows no change in bilateral pulmonary nodules, prominent mediastinal lymph nodes are stable  (9) CT scan of the abdomen 11/03/2021 shows thrombus in the portal, splenic and superior mesenteric vein  (A) Heparin started 11/03/2021  10. PET scan 05/24/2022, low metabolic activity of right lower pulmonary nodule. No change in size of CT 02/27/2022, Carcinoma with mucinous features consistent with metastatic breast carcinoma, negative margins, no LN involvement.  Prognostics showed ER +100% strong staining, PR +20% staining, Ki-67 of 10% and HER2 2+ by IHC and negative by FISH  PLAN:  Anemia Recent blood transfusion with persistent symptoms of weakness, shakiness, and nausea. Hemoglobin dropped from 11 to 7.8 over the past two months. No active bleeding reported. Likely secondary to cirrhosis and portal hypertension and bone marrow suppression from [place]. She got transfused and Hb today greater than 9. Will attempt Ibrance every other day to see if she can tolerate this better. Continue with faslodex as planned today.  Cirrhosis Chronic condition, likely contributing to anemia. No current treatment  plan discussed. -Continue monitoring.  Breast Cancer Stable on Ibrance and Faslodex. Last PET scan in September showed no active disease. CT chest from a week ago once again with no obvious progression.   Portal Vein Thrombosis Chronic condition, on indefinite Eliquis for anticoagulation. -Continue Eliquis.   Total time: 30 min *Total Encounter Time as defined by the Centers for Medicare and Medicaid Services includes, in addition to the face-to-face time of a patient visit (documented in the note above) non-face-to-face time: obtaining and reviewing outside history, ordering and reviewing medications, tests or procedures, care coordination (communications with other health care professionals or caregivers) and documentation in the medical record.

## 2023-12-05 ENCOUNTER — Telehealth: Payer: Self-pay | Admitting: Hematology and Oncology

## 2023-12-05 NOTE — Telephone Encounter (Signed)
Spoke with patient confirming upcoming appointment  

## 2023-12-15 ENCOUNTER — Other Ambulatory Visit: Payer: Self-pay | Admitting: *Deleted

## 2023-12-15 DIAGNOSIS — D649 Anemia, unspecified: Secondary | ICD-10-CM

## 2023-12-15 DIAGNOSIS — K922 Gastrointestinal hemorrhage, unspecified: Secondary | ICD-10-CM

## 2023-12-15 DIAGNOSIS — D500 Iron deficiency anemia secondary to blood loss (chronic): Secondary | ICD-10-CM

## 2023-12-16 ENCOUNTER — Other Ambulatory Visit: Payer: Medicare Other

## 2023-12-17 ENCOUNTER — Other Ambulatory Visit: Payer: Medicare Other

## 2024-01-01 ENCOUNTER — Inpatient Hospital Stay: Payer: Medicare Other | Admitting: Hematology and Oncology

## 2024-01-01 ENCOUNTER — Ambulatory Visit: Payer: Medicare Other

## 2024-01-01 ENCOUNTER — Telehealth: Payer: Self-pay | Admitting: *Deleted

## 2024-01-01 ENCOUNTER — Inpatient Hospital Stay: Payer: Medicare Other

## 2024-01-01 ENCOUNTER — Inpatient Hospital Stay: Payer: Medicare Other | Attending: Hematology and Oncology

## 2024-01-01 NOTE — Telephone Encounter (Signed)
 Left message regarding missed appts

## 2024-01-02 ENCOUNTER — Encounter: Payer: Self-pay | Admitting: Hematology and Oncology

## 2024-01-02 ENCOUNTER — Telehealth: Payer: Self-pay | Admitting: Hematology and Oncology

## 2024-01-02 NOTE — Telephone Encounter (Signed)
 Spoke with patient confirming upcoming appointment

## 2024-01-05 ENCOUNTER — Ambulatory Visit: Payer: Medicare Other

## 2024-01-05 ENCOUNTER — Telehealth: Payer: Self-pay | Admitting: *Deleted

## 2024-01-05 ENCOUNTER — Ambulatory Visit: Payer: Medicare Other | Admitting: Hematology and Oncology

## 2024-01-05 ENCOUNTER — Other Ambulatory Visit: Payer: Medicare Other

## 2024-01-05 NOTE — Telephone Encounter (Signed)
 This RN spoke with the patient's husband per appt for today cancelled -he states presently Beadie is depressed due to symptoms that have been answered medically.  Plan at present is Mr Turi will call this RN for an appt when Ashayla feel more up to it- but he also knows and will let Amala know our concerns.  Validation given regarding how Aigner is feeling with re-assurance of our goal in supporting her the best we can.

## 2024-01-26 ENCOUNTER — Other Ambulatory Visit: Payer: Self-pay | Admitting: *Deleted

## 2024-01-26 DIAGNOSIS — D500 Iron deficiency anemia secondary to blood loss (chronic): Secondary | ICD-10-CM

## 2024-01-26 DIAGNOSIS — D508 Other iron deficiency anemias: Secondary | ICD-10-CM

## 2024-01-26 DIAGNOSIS — C50911 Malignant neoplasm of unspecified site of right female breast: Secondary | ICD-10-CM

## 2024-01-26 DIAGNOSIS — D649 Anemia, unspecified: Secondary | ICD-10-CM

## 2024-01-27 ENCOUNTER — Other Ambulatory Visit: Payer: Self-pay | Admitting: Hematology and Oncology

## 2024-01-29 ENCOUNTER — Other Ambulatory Visit: Payer: Medicare Other

## 2024-01-29 ENCOUNTER — Inpatient Hospital Stay: Payer: Medicare Other | Admitting: Hematology and Oncology

## 2024-01-29 ENCOUNTER — Encounter: Payer: Self-pay | Admitting: Hematology and Oncology

## 2024-01-29 ENCOUNTER — Inpatient Hospital Stay: Payer: Medicare Other

## 2024-01-29 ENCOUNTER — Ambulatory Visit: Payer: Medicare Other

## 2024-02-18 ENCOUNTER — Encounter: Payer: Self-pay | Admitting: Hematology and Oncology

## 2024-02-24 ENCOUNTER — Other Ambulatory Visit (HOSPITAL_COMMUNITY): Payer: Self-pay

## 2024-02-26 ENCOUNTER — Ambulatory Visit: Payer: Medicare Other

## 2024-02-26 ENCOUNTER — Other Ambulatory Visit: Payer: Self-pay | Admitting: *Deleted

## 2024-02-27 ENCOUNTER — Inpatient Hospital Stay: Payer: Medicare Other

## 2024-03-02 ENCOUNTER — Inpatient Hospital Stay

## 2024-03-03 ENCOUNTER — Telehealth: Payer: Self-pay | Admitting: *Deleted

## 2024-03-03 ENCOUNTER — Other Ambulatory Visit: Payer: Self-pay

## 2024-03-03 ENCOUNTER — Observation Stay (HOSPITAL_COMMUNITY)
Admission: EM | Admit: 2024-03-03 | Discharge: 2024-03-05 | Disposition: A | Discharge status: Home / Self Care | Attending: Internal Medicine | Admitting: Internal Medicine

## 2024-03-03 ENCOUNTER — Emergency Department (HOSPITAL_COMMUNITY)

## 2024-03-03 ENCOUNTER — Inpatient Hospital Stay: Attending: Hematology and Oncology

## 2024-03-03 DIAGNOSIS — D649 Anemia, unspecified: Principal | ICD-10-CM | POA: Insufficient documentation

## 2024-03-03 DIAGNOSIS — Z1721 Progesterone receptor positive status: Secondary | ICD-10-CM | POA: Insufficient documentation

## 2024-03-03 DIAGNOSIS — R0600 Dyspnea, unspecified: Secondary | ICD-10-CM | POA: Diagnosis present

## 2024-03-03 DIAGNOSIS — Z5111 Encounter for antineoplastic chemotherapy: Secondary | ICD-10-CM | POA: Insufficient documentation

## 2024-03-03 DIAGNOSIS — R799 Abnormal finding of blood chemistry, unspecified: Secondary | ICD-10-CM | POA: Insufficient documentation

## 2024-03-03 DIAGNOSIS — D509 Iron deficiency anemia, unspecified: Secondary | ICD-10-CM | POA: Insufficient documentation

## 2024-03-03 DIAGNOSIS — I82509 Chronic embolism and thrombosis of unspecified deep veins of unspecified lower extremity: Secondary | ICD-10-CM | POA: Diagnosis not present

## 2024-03-03 DIAGNOSIS — M8580 Other specified disorders of bone density and structure, unspecified site: Secondary | ICD-10-CM | POA: Insufficient documentation

## 2024-03-03 DIAGNOSIS — C7801 Secondary malignant neoplasm of right lung: Secondary | ICD-10-CM | POA: Diagnosis not present

## 2024-03-03 DIAGNOSIS — J90 Pleural effusion, not elsewhere classified: Secondary | ICD-10-CM | POA: Insufficient documentation

## 2024-03-03 DIAGNOSIS — E876 Hypokalemia: Secondary | ICD-10-CM | POA: Insufficient documentation

## 2024-03-03 DIAGNOSIS — K219 Gastro-esophageal reflux disease without esophagitis: Secondary | ICD-10-CM | POA: Diagnosis not present

## 2024-03-03 DIAGNOSIS — Z7901 Long term (current) use of anticoagulants: Secondary | ICD-10-CM

## 2024-03-03 DIAGNOSIS — K7460 Unspecified cirrhosis of liver: Secondary | ICD-10-CM | POA: Insufficient documentation

## 2024-03-03 DIAGNOSIS — R161 Splenomegaly, not elsewhere classified: Secondary | ICD-10-CM | POA: Insufficient documentation

## 2024-03-03 DIAGNOSIS — D508 Other iron deficiency anemias: Secondary | ICD-10-CM

## 2024-03-03 DIAGNOSIS — E874 Mixed disorder of acid-base balance: Secondary | ICD-10-CM | POA: Diagnosis not present

## 2024-03-03 DIAGNOSIS — D500 Iron deficiency anemia secondary to blood loss (chronic): Secondary | ICD-10-CM

## 2024-03-03 DIAGNOSIS — C50919 Malignant neoplasm of unspecified site of unspecified female breast: Secondary | ICD-10-CM | POA: Diagnosis not present

## 2024-03-03 DIAGNOSIS — E872 Acidosis, unspecified: Secondary | ICD-10-CM

## 2024-03-03 DIAGNOSIS — Z79899 Other long term (current) drug therapy: Secondary | ICD-10-CM | POA: Diagnosis not present

## 2024-03-03 DIAGNOSIS — I82409 Acute embolism and thrombosis of unspecified deep veins of unspecified lower extremity: Secondary | ICD-10-CM | POA: Diagnosis present

## 2024-03-03 DIAGNOSIS — Z1732 Human epidermal growth factor receptor 2 negative status: Secondary | ICD-10-CM | POA: Insufficient documentation

## 2024-03-03 DIAGNOSIS — E611 Iron deficiency: Secondary | ICD-10-CM | POA: Insufficient documentation

## 2024-03-03 DIAGNOSIS — C773 Secondary and unspecified malignant neoplasm of axilla and upper limb lymph nodes: Secondary | ICD-10-CM | POA: Insufficient documentation

## 2024-03-03 DIAGNOSIS — Z86718 Personal history of other venous thrombosis and embolism: Secondary | ICD-10-CM | POA: Insufficient documentation

## 2024-03-03 DIAGNOSIS — Z170 Estrogen receptor positive status [ER+]: Secondary | ICD-10-CM | POA: Insufficient documentation

## 2024-03-03 DIAGNOSIS — C7802 Secondary malignant neoplasm of left lung: Secondary | ICD-10-CM | POA: Insufficient documentation

## 2024-03-03 DIAGNOSIS — C50911 Malignant neoplasm of unspecified site of right female breast: Secondary | ICD-10-CM

## 2024-03-03 DIAGNOSIS — D61818 Other pancytopenia: Secondary | ICD-10-CM | POA: Diagnosis present

## 2024-03-03 DIAGNOSIS — R5383 Other fatigue: Secondary | ICD-10-CM | POA: Diagnosis present

## 2024-03-03 DIAGNOSIS — D696 Thrombocytopenia, unspecified: Secondary | ICD-10-CM | POA: Insufficient documentation

## 2024-03-03 DIAGNOSIS — C50411 Malignant neoplasm of upper-outer quadrant of right female breast: Secondary | ICD-10-CM | POA: Insufficient documentation

## 2024-03-03 DIAGNOSIS — K766 Portal hypertension: Secondary | ICD-10-CM | POA: Insufficient documentation

## 2024-03-03 DIAGNOSIS — D72819 Decreased white blood cell count, unspecified: Secondary | ICD-10-CM | POA: Insufficient documentation

## 2024-03-03 DIAGNOSIS — R0602 Shortness of breath: Secondary | ICD-10-CM | POA: Diagnosis not present

## 2024-03-03 LAB — CBC WITH DIFFERENTIAL/PLATELET
Abs Immature Granulocytes: 0.02 10*3/uL (ref 0.00–0.07)
Basophils Absolute: 0 10*3/uL (ref 0.0–0.1)
Basophils Relative: 0 %
Eosinophils Absolute: 0.1 10*3/uL (ref 0.0–0.5)
Eosinophils Relative: 2 %
HCT: 19.8 % — ABNORMAL LOW (ref 36.0–46.0)
Hemoglobin: 4.9 g/dL — CL (ref 12.0–15.0)
Immature Granulocytes: 1 %
Lymphocytes Relative: 17 %
Lymphs Abs: 0.6 10*3/uL — ABNORMAL LOW (ref 0.7–4.0)
MCH: 24.4 pg — ABNORMAL LOW (ref 26.0–34.0)
MCHC: 24.7 g/dL — ABNORMAL LOW (ref 30.0–36.0)
MCV: 98.5 fL (ref 80.0–100.0)
Monocytes Absolute: 0.4 10*3/uL (ref 0.1–1.0)
Monocytes Relative: 12 %
Neutro Abs: 2.4 10*3/uL (ref 1.7–7.7)
Neutrophils Relative %: 68 %
Platelets: 147 10*3/uL — ABNORMAL LOW (ref 150–400)
RBC: 2.01 MIL/uL — ABNORMAL LOW (ref 3.87–5.11)
RDW: 21.2 % — ABNORMAL HIGH (ref 11.5–15.5)
WBC: 3.6 10*3/uL — ABNORMAL LOW (ref 4.0–10.5)
nRBC: 1.4 % — ABNORMAL HIGH (ref 0.0–0.2)

## 2024-03-03 LAB — COMPREHENSIVE METABOLIC PANEL
ALT: 17 U/L (ref 0–44)
AST: 23 U/L (ref 15–41)
Albumin: 3.6 g/dL (ref 3.5–5.0)
Alkaline Phosphatase: 56 U/L (ref 38–126)
Anion gap: 13 (ref 5–15)
BUN: 17 mg/dL (ref 8–23)
CO2: 16 mmol/L — ABNORMAL LOW (ref 22–32)
Calcium: 8.6 mg/dL — ABNORMAL LOW (ref 8.9–10.3)
Chloride: 110 mmol/L (ref 98–111)
Creatinine, Ser: 1.13 mg/dL — ABNORMAL HIGH (ref 0.44–1.00)
GFR, Estimated: 53 mL/min — ABNORMAL LOW (ref >60)
Glucose, Bld: 89 mg/dL (ref 70–99)
Potassium: 4.1 mmol/L (ref 3.5–5.1)
Sodium: 139 mmol/L (ref 135–145)
Total Bilirubin: 0.9 mg/dL (ref 0.0–1.2)
Total Protein: 6.8 g/dL (ref 6.5–8.1)

## 2024-03-03 LAB — CMP (CANCER CENTER ONLY)
ALT: 13 U/L (ref 0–44)
AST: 17 U/L (ref 15–41)
Albumin: 4 g/dL (ref 3.5–5.0)
Alkaline Phosphatase: 61 U/L (ref 38–126)
Anion gap: 11 (ref 5–15)
BUN: 16 mg/dL (ref 8–23)
CO2: 20 mmol/L — ABNORMAL LOW (ref 22–32)
Calcium: 8.6 mg/dL — ABNORMAL LOW (ref 8.9–10.3)
Chloride: 107 mmol/L (ref 98–111)
Creatinine: 1.05 mg/dL — ABNORMAL HIGH (ref 0.44–1.00)
GFR, Estimated: 58 mL/min — ABNORMAL LOW (ref >60)
Glucose, Bld: 135 mg/dL — ABNORMAL HIGH (ref 70–99)
Potassium: 3.7 mmol/L (ref 3.5–5.1)
Sodium: 138 mmol/L (ref 135–145)
Total Bilirubin: 0.6 mg/dL (ref 0.0–1.2)
Total Protein: 6.8 g/dL (ref 6.5–8.1)

## 2024-03-03 LAB — RESP PANEL BY RT-PCR (RSV, FLU A&B, COVID)  RVPGX2
Influenza A by PCR: NEGATIVE
Influenza B by PCR: NEGATIVE
Resp Syncytial Virus by PCR: NEGATIVE
SARS Coronavirus 2 by RT PCR: NEGATIVE

## 2024-03-03 LAB — CBC WITH DIFFERENTIAL (CANCER CENTER ONLY)
Abs Immature Granulocytes: 0.01 10*3/uL (ref 0.00–0.07)
Basophils Absolute: 0 10*3/uL (ref 0.0–0.1)
Basophils Relative: 0 %
Eosinophils Absolute: 0.1 10*3/uL (ref 0.0–0.5)
Eosinophils Relative: 2 %
HCT: 19 % — ABNORMAL LOW (ref 36.0–46.0)
Hemoglobin: 5.2 g/dL — CL (ref 12.0–15.0)
Immature Granulocytes: 0 %
Lymphocytes Relative: 11 %
Lymphs Abs: 0.3 10*3/uL — ABNORMAL LOW (ref 0.7–4.0)
MCH: 25 pg — ABNORMAL LOW (ref 26.0–34.0)
MCHC: 27.4 g/dL — ABNORMAL LOW (ref 30.0–36.0)
MCV: 91.3 fL (ref 80.0–100.0)
Monocytes Absolute: 0.3 10*3/uL (ref 0.1–1.0)
Monocytes Relative: 10 %
Neutro Abs: 2.4 10*3/uL (ref 1.7–7.7)
Neutrophils Relative %: 77 %
Platelet Count: 124 10*3/uL — ABNORMAL LOW (ref 150–400)
RBC: 2.08 MIL/uL — ABNORMAL LOW (ref 3.87–5.11)
RDW: 20.8 % — ABNORMAL HIGH (ref 11.5–15.5)
WBC Count: 3.2 10*3/uL — ABNORMAL LOW (ref 4.0–10.5)
nRBC: 0.6 % — ABNORMAL HIGH (ref 0.0–0.2)

## 2024-03-03 LAB — IRON AND IRON BINDING CAPACITY (CC-WL,HP ONLY)
Iron: 34 ug/dL (ref 28–170)
Saturation Ratios: 5 % — ABNORMAL LOW (ref 10.4–31.8)
TIBC: 637 ug/dL — ABNORMAL HIGH (ref 250–450)
UIBC: 603 ug/dL — ABNORMAL HIGH (ref 148–442)

## 2024-03-03 LAB — POC OCCULT BLOOD, ED: Fecal Occult Bld: NEGATIVE

## 2024-03-03 LAB — FERRITIN: Ferritin: 7 ng/mL — ABNORMAL LOW (ref 11–307)

## 2024-03-03 LAB — SAMPLE TO BLOOD BANK

## 2024-03-03 LAB — RETICULOCYTES
Immature Retic Fract: 32 % — ABNORMAL HIGH (ref 2.3–15.9)
RBC.: 2.01 MIL/uL — ABNORMAL LOW (ref 3.87–5.11)
Retic Count, Absolute: 66.3 10*3/uL (ref 19.0–186.0)
Retic Ct Pct: 3.3 % — ABNORMAL HIGH (ref 0.4–3.1)

## 2024-03-03 LAB — PREPARE RBC (CROSSMATCH)

## 2024-03-03 MED ORDER — SODIUM CHLORIDE 0.9% FLUSH
3.0000 mL | Freq: Two times a day (BID) | INTRAVENOUS | Status: DC
  Administered 2024-03-03 – 2024-03-04 (×3): 3 mL via INTRAVENOUS

## 2024-03-03 MED ORDER — OFLOXACIN 0.3 % OP SOLN
5.0000 [drp] | Freq: Every day | OPHTHALMIC | Status: DC
  Administered 2024-03-03 – 2024-03-05 (×3): 5 [drp] via OTIC
  Filled 2024-03-03 (×2): qty 5

## 2024-03-03 MED ORDER — POLYETHYLENE GLYCOL 3350 17 G PO PACK: 17.0000 g | PACK | Freq: Every day | ORAL | Status: DC | PRN

## 2024-03-03 MED ORDER — SODIUM CHLORIDE 0.9% IV SOLUTION: Freq: Once | INTRAVENOUS | Status: DC

## 2024-03-03 MED ORDER — ACETAMINOPHEN 650 MG RE SUPP: 650.0000 mg | Freq: Four times a day (QID) | RECTAL | Status: DC | PRN

## 2024-03-03 MED ORDER — ACETAMINOPHEN 325 MG PO TABS
650.0000 mg | ORAL_TABLET | Freq: Four times a day (QID) | ORAL | Status: DC | PRN
  Administered 2024-03-04: 650 mg via ORAL
  Filled 2024-03-03: qty 2

## 2024-03-03 NOTE — ED Notes (Signed)
 Patient got dizzy while walking back from bathroom assisted patient back to bedside, placed on cardiac monitor and vitals obtained.

## 2024-03-03 NOTE — Telephone Encounter (Addendum)
 Pt came in today for lab- with reported heme of 5.2.  Note pt has declined coming in for visits due to "not feeling well" - including missing prolia and faslodex injections.  Note concern of missed appts discussed with husband who feels pt has felt depressed due to issues of health that have been worked up but not revealing of diagnosis (non cancer related).  Pt came in today due to need for further refills   This RN went to the lobby without locating pt .  Called and spoke with her - discussed lab with pt verifying being symptomatic.   Informed her and her husband of currently no availability for lab to be done in this office as well as concern that due to level need for more acute setting for evaluation and monitoring during the transfusion as well as need for further work up due to why Elina's heme is low.   Lazara's known breast cancer or Ibrance therapy would cause the low hemoglobin.  Note pt is also on Ibrance which is on hold currently due to lab needed for continuance of therapy.  Pt will proceed to the ER at this time due to life preserving concerns and need for further work up.

## 2024-03-03 NOTE — ED Triage Notes (Signed)
 Pt arrived via POV. Was sent over from cancer center- had blood drawn today- Hgb was 5.2.  Pt has been feeling weak.

## 2024-03-03 NOTE — H&P (Signed)
 History and Physical    Patient: Nicole [name] BJY:[id number] DOB: 01-17-1956 DOA: 03/03/2024 DOS: the patient was seen and examined on 03/03/2024 PCP: Patient, No Pcp Per  Patient coming from: Home  Chief Complaint:  Chief Complaint  Patient presents with   abnormal labs   HPI: Nicole [name] is a 68 y.o. female with medical history significant of medical issues as listed below.  Patient reports insidious onset of shortness of breath for approximately 3 weeks or more.  It has been progressive, exertional.  Not associated with any chest pain fever palpitation cough or leg swelling.  Patient got so short of breath in the last several days that she could barely walk across the room.  Patient came to the ER today accompanied by her husband.    Patient vitally stable, CBC testing in the ER reveals severe anemia.  Patient is currently pending transfusion for 2 unit PRBC.  Patient denies any melena, nosebleeding gum bleeding or hematuria.  No skin bruising.  Patient is chronically anticoagulated with Eliquis for chronic portal vein thrombosis.  Patient offers no other complaints except as above. Review of Systems: As mentioned in the history of present illness. All other systems reviewed and are negative. Past Medical History:  Diagnosis Date   Anemia    low iron   Arthritis    fingers   Blood transfusion 2012   Breast CA (HCC)    breast ca dx 06/2010, stage 4, right , Lung Metasis.Surgery, Chemo, Radiation   Breast cancer (HCC)    Clot    jugular  ?2015   Depression    Esophageal varices (HCC)    Headache    Mirgraine- rare since menopause   Heart murmur    mild, no cardiologist, from birth   Hematemesis 08/2017   History of kidney stones    no pain- shows in CT- small 1 mm bilaterally- kidney   Hypertension    IBD (inflammatory bowel disease)    resolved   Idiopathic cirrhosis (HCC)    pt denies   Peripheral vascular disease (HCC) few yrs ago   clot in right jugular   Portal  hypertension (HCC)    Radiation 07/21/12-09/07/12   5940 cGy   Restless legs    Past Surgical History:  Procedure Laterality Date   BIOPSY  08/21/2020   Procedure: BIOPSY;  Surgeon: Kerin Salen, MD;  Location: [facility];  Service: Gastroenterology;;   COLONOSCOPY  2016   DILATION AND CURETTAGE OF UTERUS  2004?   ESOPHAGEAL BANDING  09/04/2017   Procedure: ESOPHAGEAL BANDING;  Surgeon: Kerin Salen, MD;  Location: [facility] ENDOSCOPY;  Service: Gastroenterology;;   ESOPHAGEAL BANDING  11/27/2017   Procedure: ESOPHAGEAL BANDING;  Surgeon: Kerin Salen, MD;  Location: [facility] ENDOSCOPY;  Service: Gastroenterology;;   ESOPHAGEAL BANDING N/A 04/28/2018   Procedure: ESOPHAGEAL BANDING;  Surgeon: Kerin Salen, MD;  Location: [facility];  Service: Gastroenterology;  Laterality: N/A;   ESOPHAGEAL BANDING  11/17/2018   Procedure: ESOPHAGEAL BANDING;  Surgeon: Kerin Salen, MD;  Location: [facility] ENDOSCOPY;  Service: Gastroenterology;;   ESOPHAGEAL BANDING  08/16/2019   Procedure: ESOPHAGEAL BANDING;  Surgeon: Kerin Salen, MD;  Location: [facility] ENDOSCOPY;  Service: Gastroenterology;;   ESOPHAGEAL BANDING N/A 08/21/2020   Procedure: ESOPHAGEAL BANDING;  Surgeon: Kerin Salen, MD;  Location: [facility];  Service: Gastroenterology;  Laterality: N/A;   ESOPHAGEAL BANDING  10/21/2023   Procedure: ESOPHAGEAL BANDING;  Surgeon: Kerin Salen, MD;  Location: [facility];  Service: Gastroenterology;;   ESOPHAGOGASTRODUODENOSCOPY  N/A 04/28/2018   Procedure: ESOPHAGOGASTRODUODENOSCOPY (EGD);  Surgeon: Kerin Salen, MD;  Location: [facility]OSCOPY;  Service: Gastroenterology;  Laterality: N/A;   ESOPHAGOGASTRODUODENOSCOPY N/A 11/17/2018   Procedure: ESOPHAGOGASTRODUODENOSCOPY (EGD);  Surgeon: Kerin Salen, MD;  Location: [facility];  Service: Gastroenterology;  Laterality: N/A;   ESOPHAGOGASTRODUODENOSCOPY N/A 08/13/2022   Procedure: ESOPHAGOGASTRODUODENOSCOPY (EGD);  Surgeon: Kerin Salen, MD;  Location: [facility] ENDOSCOPY;  Service:  Gastroenterology;  Laterality: N/A;   ESOPHAGOGASTRODUODENOSCOPY (EGD) WITH PROPOFOL N/A 04/11/2016   Procedure: ESOPHAGOGASTRODUODENOSCOPY (EGD) WITH PROPOFOL;  Surgeon: Vida Rigger, MD;  Location: [facility];  Service: Endoscopy;  Laterality: N/A;   ESOPHAGOGASTRODUODENOSCOPY (EGD) WITH PROPOFOL N/A 07/08/2016   Procedure: ESOPHAGOGASTRODUODENOSCOPY (EGD) WITH PROPOFOL;  Surgeon: Carman Ching, MD;  Location: [facility] ENDOSCOPY;  Service: Endoscopy;  Laterality: N/A;   ESOPHAGOGASTRODUODENOSCOPY (EGD) WITH PROPOFOL N/A 03/03/2017   Procedure: ESOPHAGOGASTRODUODENOSCOPY (EGD) WITH PROPOFOL;  Surgeon: Carman Ching, MD;  Location: [facility] ENDOSCOPY;  Service: Endoscopy;  Laterality: N/A;   ESOPHAGOGASTRODUODENOSCOPY (EGD) WITH PROPOFOL N/A 09/04/2017   Procedure: ESOPHAGOGASTRODUODENOSCOPY (EGD) WITH PROPOFOL;  Surgeon: Kerin Salen, MD;  Location: [facility] ENDOSCOPY;  Service: Gastroenterology;  Laterality: N/A;   ESOPHAGOGASTRODUODENOSCOPY (EGD) WITH PROPOFOL N/A 11/27/2017   Procedure: ESOPHAGOGASTRODUODENOSCOPY (EGD);  Surgeon: Kerin Salen, MD;  Location: [facility] ENDOSCOPY;  Service: Gastroenterology;  Laterality: N/A;   ESOPHAGOGASTRODUODENOSCOPY (EGD) WITH PROPOFOL N/A 08/16/2019   Procedure: ESOPHAGOGASTRODUODENOSCOPY (EGD) WITH PROPOFOL;  Surgeon: Kerin Salen, MD;  [facility];  Service: Gastroenterology;  Laterality: N/A;   ESOPHAGOGASTRODUODENOSCOPY (EGD) WITH PROPOFOL N/A 08/21/2020   Procedure: ESOPHAGOGASTRODUODENOSCOPY (EGD) WITH PROPOFOL;  Surgeon: Kerin Salen, MD;  [facility];  Service: Gastroenterology;  Laterality: N/A;   ESOPHAGOGASTRODUODENOSCOPY (EGD) WITH PROPOFOL N/A 08/24/2021   Procedure: ESOPHAGOGASTRODUODENOSCOPY (EGD) WITH PROPOFOL;  Surgeon: Kerin Salen, MD;  [facility]: [facility];  Service: Gastroenterology;  Laterality: N/A;   ESOPHAGOGASTRODUODENOSCOPY (EGD) WITH PROPOFOL N/A 06/25/2023   Procedure: ESOPHAGOGASTRODUODENOSCOPY (EGD) WITH PROPOFOL;  Surgeon: Vida Rigger, MD;   Location: [facility];  Service: Gastroenterology;  Laterality: N/A;  W. bandging   ESOPHAGOGASTRODUODENOSCOPY (EGD) WITH PROPOFOL N/A 10/21/2023   Procedure: ESOPHAGOGASTRODUODENOSCOPY (EGD) WITH PROPOFOL;  Surgeon: Kerin Salen, MD;  Location: [facility];  Service: Gastroenterology;  Laterality: N/A;  with banding and APC   GASTRIC VARICES BANDING N/A 08/24/2021   Procedure: GASTRIC VARICES BANDING;  Surgeon: Kerin Salen, MD;  Location: [facility];  Service: Gastroenterology;  Laterality: N/A;   GASTRIC VARICES BANDING N/A 08/13/2022   Procedure: GASTRIC VARICES BANDING;  Surgeon: Kerin Salen, MD;  Location: [facility];  Service: Gastroenterology;  Laterality: N/A;   HEMOSTASIS CLIP PLACEMENT  08/21/2020   Procedure: HEMOSTASIS CLIP PLACEMENT;  Surgeon: Kerin Salen, MD;  Location: [facility];  Service: Gastroenterology;;   HOT HEMOSTASIS N/A 10/21/2023   Procedure: HOT HEMOSTASIS (ARGON PLASMA COAGULATION/BICAP);  Surgeon: Kerin Salen, MD;  Location: [facility] ENDOSCOPY;  Service: Gastroenterology;  Laterality: N/A;   INTERCOSTAL NERVE BLOCK Left 07/01/2022   Procedure: INTERCOSTAL NERVE BLOCK;  Surgeon: Loreli Slot, MD;  Location: [facility] OR;  Service: Thoracic;  Laterality: Left;   IR GENERIC HISTORICAL  05/15/2016   IR RADIOLOGIST EVAL & MGMT 05/15/2016 Gilmer Mor, DO [facility] INTERV RAD   IR GENERIC HISTORICAL  01/14/2017   IR US GUIDE VASC ACCESS RIGHT 01/14/2017 Berdine Dance, MD [facility]   IR GENERIC HISTORICAL  01/14/2017   IR VENOGRAM HEPATIC W HEMODYNAMIC EVALUATION 01/14/2017 Berdine Dance, MD [facility]   IR RADIOLOGIST EVAL & MGMT  06/12/2017   IR RADIOLOGIST EVAL & MGMT  08/11/2018  IR RADIOLOGIST EVAL & MGMT  08/25/2019   IR RADIOLOGIST EVAL & MGMT  08/22/2020   IR RADIOLOGIST EVAL & MGMT  03/11/2022   MASTECTOMY Right 06/02/12   right breast with lymph node removal   NODE DISSECTION Left 07/01/2022   Procedure: NODE DISSECTION;  Surgeon: Loreli Slot, MD;  Location:  [facility] OR;  Service: Thoracic;  Laterality: Left;   PORT-A-CATH REMOVAL     PORTACATH PLACEMENT  2011   left side   RADIOFREQUENCY ABLATION  06/25/2023   Procedure: RADIO FREQUENCY ABLATION;  Surgeon: Vida Rigger, MD;  Location: [facility] ENDOSCOPY;  Service: Gastroenterology;;   RADIOLOGY WITH ANESTHESIA N/A 04/13/2016   Procedure: RADIOLOGY WITH ANESTHESIA;  Surgeon: Gilmer Mor, DO;  Location: [facility] OR;  Service: Anesthesiology;  Laterality: N/A;   VIDEO BRONCHOSCOPY WITH ENDOBRONCHIAL NAVIGATION N/A 07/01/2022   Procedure: VIDEO BRONCHOSCOPY WITH ENDOBRONCHIAL NAVIGATION;  Surgeon: Loreli Slot, MD;  Location: [facility] OR;  Service: Thoracic;  Laterality: N/A;   XI ROBOTIC ASSISTED THORACOSCOPY- SEGMENTECTOMY Left 07/01/2022   Procedure: XI ROBOTIC ASSISTED THORACOSCOPY-LEFT LOWER LOBE BASILAR SEGMENTECTOMY;  Surgeon: Loreli Slot, MD;  Location: [facility] OR;  Service: Thoracic;  Laterality: Left;   Social History:  reports that she has never smoked. She has never used smokeless tobacco. She reports that she does not drink alcohol and does not use drugs.  No Known Allergies  Family History  Problem Relation Age of Onset   COPD Mother    AAA (abdominal aortic aneurysm) Mother    COPD Father    Cancer Neg Hx     Prior to Admission medications   Medication Sig Start Date End Date Taking? Authorizing Provider  denosumab (PROLIA) 60 MG/ML SOLN injection Inject 60 mg into the skin every 6 (six) months. Administer in upper arm, thigh, or abdomen    [provider]  ELIQUIS 5 MG TABS tablet TAKE 1 TABLET BY MOUTH TWICE A DAY 01/27/24   Rachel Moulds, MD  folic acid (FOLVITE) 1 MG tablet Take 1 mg by mouth daily.    [provider]  furosemide (LASIX) 20 MG tablet Take 20 mg by mouth daily. 06/03/23   [provider]  Milk Thistle 1000 MG CAPS Take 1,000 mg by mouth daily. 11/14/21   [provider]  palbociclib (IBRANCE) 75 MG tablet Take 1 tablet (75 mg total) by  mouth daily. Take for 21 days on, 7 days off, repeat every 28 days. 10/02/23   Rachel Moulds, MD  pantoprazole (PROTONIX) 40 MG tablet Take 40 mg by mouth daily.    [provider]  propranolol (INDERAL) 20 MG tablet Take 1 tablet (20 mg total) by mouth 2 (two) times daily. 09/07/17   Hongalgi, Maximino Greenland, MD  spironolactone (ALDACTONE) 25 MG tablet Take 25 mg by mouth daily. 06/03/23   [provider]  venlafaxine XR (EFFEXOR-XR) 150 MG 24 hr capsule Take 1 capsule (150 mg total) by mouth daily with breakfast. 08/28/23   Rachel Moulds, MD    Physical Exam: Vitals:   03/03/24 1714 03/03/24 1901 03/03/24 2015 03/03/24 2102  BP:  (!) 149/98 (!) 145/109   Pulse:  82 97   Resp:  (!) 33 (!) 22   Temp:    98.1 F (36.7 C)  TempSrc:    Oral  SpO2:  (!) 86% 97%   Weight: 56.7 kg     Height: 5\' 4"  (1.626 m)      General: Patient is alert and awake oriented X  3, appears to be in no distress.  Pallor is apparent. Respiratory exam: Bilateral intravesicular Cardiovascular exam S1-S2 normal Abdomen all quadrants are soft nontender Extremities warm without edema. Data Reviewed:  Labs on Admission:  Results for orders placed or performed during the hospital encounter of 03/03/24 (from the past 24 hours)  Resp panel by RT-PCR (RSV, Flu A&B, Covid) Anterior Nasal Swab     Status: None   Collection Time: 03/03/24  5:24 PM   Specimen: Anterior Nasal Swab  Result Value Ref Range   SARS Coronavirus 2 by RT PCR NEGATIVE NEGATIVE   Influenza A by PCR NEGATIVE NEGATIVE   Influenza B by PCR NEGATIVE NEGATIVE   Resp Syncytial Virus by PCR NEGATIVE NEGATIVE  Type and screen [facility]     Status: None (Preliminary result)   Collection Time: 03/03/24  6:50 PM  Result Value Ref Range   ABO/RH(D) PENDING    Antibody Screen PENDING    Sample Expiration      03/06/2024,2359 Performed at [facility], [address] [place], [place] [address]   CBC  with Differential     Status: Abnormal   Collection Time: 03/03/24  6:50 PM  Result Value Ref Range   WBC 3.6 (L) 4.0 - 10.5 K/uL   RBC 2.01 (L) 3.87 - 5.11 MIL/uL   Hemoglobin 4.9 (LL) 12.0 - 15.0 g/dL   HCT 60.4 (L) 54.0 - 98.1 %   MCV 98.5 80.0 - 100.0 fL   MCH 24.4 (L) 26.0 - 34.0 pg   MCHC 24.7 (L) 30.0 - 36.0 g/dL   RDW 19.1 (H) 47.8 - 29.5 %   Platelets 147 (L) 150 - 400 K/uL   nRBC 1.4 (H) 0.0 - 0.2 %   Neutrophils Relative % 68 %   Neutro Abs 2.4 1.7 - 7.7 K/uL   Lymphocytes Relative 17 %   Lymphs Abs 0.6 (L) 0.7 - 4.0 K/uL   Monocytes Relative 12 %   Monocytes Absolute 0.4 0.1 - 1.0 K/uL   Eosinophils Relative 2 %   Eosinophils Absolute 0.1 0.0 - 0.5 K/uL   Basophils Relative 0 %   Basophils Absolute 0.0 0.0 - 0.1 K/uL   Immature Granulocytes 1 %   Abs Immature Granulocytes 0.02 0.00 - 0.07 K/uL  Comprehensive metabolic panel     Status: Abnormal   Collection Time: 03/03/24  6:50 PM  Result Value Ref Range   Sodium 139 135 - 145 mmol/L   Potassium 4.1 3.5 - 5.1 mmol/L   Chloride 110 98 - 111 mmol/L   CO2 16 (L) 22 - 32 mmol/L   Glucose, Bld 89 70 - 99 mg/dL   BUN 17 8 - 23 mg/dL   Creatinine, Ser 6.21 (H) 0.44 - 1.00 mg/dL   Calcium 8.6 (L) 8.9 - 10.3 mg/dL   Total Protein 6.8 6.5 - 8.1 g/dL   Albumin 3.6 3.5 - 5.0 g/dL   AST 23 15 - 41 U/L   ALT 17 0 - 44 U/L   Alkaline Phosphatase 56 38 - 126 U/L   Total Bilirubin 0.9 0.0 - 1.2 mg/dL   GFR, Estimated 53 (L) >60 mL/min   Anion gap 13 5 - 15  Prepare RBC (crossmatch)     Status: None   Collection Time: 03/03/24  7:36 PM  Result Value Ref Range   Order Confirmation      ORDER PROCESSED BY BLOOD BANK Performed at [facility], [address] [place], [place] [address]   POC occult blood, ED Provider will collect     Status: None   Collection Time: 03/03/24  8:32 PM  Result Value Ref Range   Fecal Occult Bld NEGATIVE NEGATIVE   Basic Metabolic Panel: Recent Labs  Lab 03/03/24 1349  03/03/24 1850  NA 138 139  K 3.7 4.1  CL 107 110  CO2 20* 16*  GLUCOSE 135* 89  BUN 16 17  CREATININE 1.05* 1.13*  CALCIUM 8.6* 8.6*   Liver Function Tests: Recent Labs  Lab 03/03/24 1349 03/03/24 1850  AST 17 23  ALT 13 17  ALKPHOS 61 56  BILITOT 0.6 0.9  PROT 6.8 6.8  ALBUMIN 4.0 3.6   No results for input(s): "LIPASE", "AMYLASE" in the last 168 hours. No results for input(s): "AMMONIA" in the last 168 hours. CBC: Recent Labs  Lab 03/03/24 1349 03/03/24 1850  WBC 3.2* 3.6*  NEUTROABS 2.4 2.4  HGB 5.2* 4.9*  HCT 19.0* 19.8*  MCV 91.3 98.5  PLT 124* 147*   Cardiac Enzymes: No results for input(s): "CKTOTAL", "CKMB", "CKMBINDEX", "TROPONINIHS" in the last 168 hours.  BNP (last 3 results) No results for input(s): "PROBNP" in the last 8760 hours. CBG: No results for input(s): "GLUCAP" in the last 168 hours.  Radiological Exams on Admission:  DG Chest 2 View Result Date: 03/03/2024 CLINICAL DATA:  History of breast cancer presenting with low hemoglobin and weakness. EXAM: CHEST - 2 VIEW COMPARISON:  October 01, 2022 FINDINGS: The heart size and mediastinal contours are within normal limits. Ill-defined surgical sutures are seen within the perihilar region on the left. Very mild areas of scarring and/or atelectasis are seen within the right lung base and in the mid to upper lung fields, bilaterally. There is a small right pleural effusion. A stable small to moderate size left pleural effusion is also seen. No pneumothorax is identified. Radiopaque surgical clips are seen along the right axilla. Radiopaque vascular coils are seen within the medial aspect of the left upper quadrant. No acute osseous abnormalities are identified. IMPRESSION: 1. Very mild right basilar and bilateral mid to upper lung field scarring and/or atelectasis. 2. Small right pleural effusion. 3. Stable small to moderate size left pleural effusion. Electronically Signed   By: Aram Candela M.D.   On:  03/03/2024 20:24    chest X-ray  EKG: Independently reviewed. NSR  No intake/output data recorded. No intake/output data recorded.    Assessment and Plan: * Anemia Severe, symptomatic.  As part of pancytopenia.  Although the pancytopenia appears mostly chronic.  Patient hemoglobin today 5.2 is much worse than her baseline seen in the chart.  Patient had Hemoccult testing done by ER attending, which is reportedly negative.  No evidence of bleeding at this time.  I will order a full anemia panel including reticulocyte count iron level ferritin B12 haptoglobin and electrophoresis.  Patient is ordered for 2 units of PRBC transfusion by ER attending.  Will follow-up levels in the morning.  Total bilirubin is within normal limits.  This is acute on chrnoic attributed to  cirrhosis and portal hypertension and bone marrow suppression from [place].  In the past.  Metabolic acidosis I believe this is compensation from patient's respiratory alkalosis from hyperventilation due to her severe anemia.  Review of chest x-ray reveals small pleural effusions, concerning for slight fluid overload.  At this time I think watchful waiting would be better.  I anticipate her fluid overload as well as her acidosis  metabolic will resolve once patient's hemoglobin is towards the target. I reviewed the CXR images. A lot of it is comparable to oct 3 images.  Breast cancer metastasized to lung [facility]) I will quote last onc note from 12/03/2024:   Anemia Recent blood transfusion with persistent symptoms of weakness, shakiness, and nausea. Hemoglobin dropped from 11 to 7.8 over the past two months. No active bleeding reported. Likely secondary to cirrhosis and portal hypertension and bone marrow suppression from [place]. She got transfused and Hb today greater than 9. Will attempt Ibrance every other day to see if she can tolerate this better. Continue with faslodex as planned today.   Cirrhosis Chronic condition,  likely contributing to anemia. No current treatment plan discussed. -Continue monitoring.   Breast Cancer Stable on Ibrance and Faslodex. Last PET scan in September showed no active disease. CT chest from a week ago once again with no obvious progression.     Portal Vein Thrombosis Chronic condition, on indefinite Eliquis for anticoagulation. -Continue Eliquis.    DVT (deep venous thrombosis) (HCC) This is chronic. At this time will hold off on eliquis till hgb Is improved a bit.   Patient med rec pending pharmcy input   Advance Care Planning:   Code Status: Prior patient wishes to be Dnr. But would like pre-arrest interventions. She says husband aware of wishes. She has not signed documentation to this effect however.  Consults: none at this time.  Family Communication: husband left. Was aware of encounter. Patient in touch.  Severity of Illness: The appropriate patient status for this patient is OBSERVATION. Observation status is judged to be reasonable and necessary in order to provide the required intensity of service to ensure the patient's safety. The patient's presenting symptoms, physical exam findings, and initial radiographic and laboratory data in the context of their medical condition is felt to place them at decreased risk for further clinical deterioration. Furthermore, it is anticipated that the patient will be medically stable for discharge from the hospital within 2 midnights of admission.   Author: Nolberto Hanlon, MD 03/03/2024 9:29 PM  For on call review [email or web address].

## 2024-03-03 NOTE — Assessment & Plan Note (Addendum)
 Resolving

## 2024-03-03 NOTE — Assessment & Plan Note (Addendum)
 Severe, symptomatic.  As part of pancytopenia.  Although the pancytopenia appears mostly chronic.  Patient hemoglobin today 5.2 is much worse than her baseline seen in the chart.  Patient had Hemoccult testing done by ER attending, which is reportedly negative.  No evidence of bleeding at this time.  I will order a full anemia panel including reticulocyte count iron level ferritin B12 haptoglobin and electrophoresis.  Patient is ordered for 2 units of PRBC transfusion by ER attending.  Will follow-up levels in the morning.  Total bilirubin is within normal limits.  This is acute on chrnoic attributed to  cirrhosis and portal hypertension and bone marrow suppression from [place].  In the past.

## 2024-03-03 NOTE — ED Provider Triage Note (Signed)
 Emergency Medicine Provider Triage Evaluation Note  KELLIANNE EK , a 68 y.o. female  was evaluated in triage.  Pt complains of anemia.  Review of Systems  Positive:  Negative:   Physical Exam  BP (!) 122/51 (BP Location: Left Arm)   Pulse 84   Temp (!) 97.5 F (36.4 C) (Oral)   Resp 18   Ht 5\' 4"  (1.626 m)   Wt 56.7 kg   SpO2 93%   BMI 21.46 kg/m  Gen:   Awake, no distress   Resp:  Normal effort  MSK:   Moves extremities without difficulty  Other:    Medical Decision Making  Medically screening exam initiated at 5:17 PM.  Appropriate orders placed.  [name] was informed that the remainder of the evaluation will be completed by another provider, this initial triage assessment does not replace that evaluation, and the importance of remaining in the ED until their evaluation is complete.  Sent for low HGB. Oncologist has note in for today. Patient stating that she has been feeling weak and SOB. Also has been coughing the past couple of days. Denies fever, nausea, vomiting, diarrhea.    Valrie Hart F, [place] 03/03/24 1718

## 2024-03-03 NOTE — Assessment & Plan Note (Signed)
 I will quote last onc note from 12/03/2024:   Anemia Recent blood transfusion with persistent symptoms of weakness, shakiness, and nausea. Hemoglobin dropped from 11 to 7.8 over the past two months. No active bleeding reported. Likely secondary to cirrhosis and portal hypertension and bone marrow suppression from [employer]. She got transfused and Hb today greater than 9. Will attempt Ibrance every other day to see if she can tolerate this better. Continue with faslodex as planned today.   Cirrhosis Chronic condition, likely contributing to anemia. No current treatment plan discussed. -Continue monitoring.   Breast Cancer Stable on Ibrance and Faslodex. Last PET scan in September showed no active disease. CT chest from a week ago once again with no obvious progression.     Portal Vein Thrombosis Chronic condition, on indefinite Eliquis for anticoagulation. -Continue Eliquis.

## 2024-03-03 NOTE — Assessment & Plan Note (Signed)
 This is chronic. At this time will hold off on eliquis till hgb Is improved a bit.

## 2024-03-03 NOTE — ED Provider Notes (Signed)
 [place] EMERGENCY DEPARTMENT AT [facility] Provider Note   CSN: [id number] Arrival date & time: 03/03/24  1611     History  Chief Complaint  Patient presents with   abnormal labs    MODEST DRAEGER is a 68 y.o. female.  Pt is a 67y/o female with history of breast cancer, cirrhosis, and portal vein thrombosis with recurrent anemia thought to be secondary to cirrhosis and portal hypertension and bone marrow suppression from [facility] for her breast cancer who is presenting today with 3 weeks of worsening fatigue, dyspnea on exertion and feeling shaky.  She reports that all the symptoms are present with any type of activity.  At this time she cannot even walk across a room without feeling the symptoms and feeling like she needs to sit down and rest.  She has had no hematemesis, abdominal pain or chest pain.  Her stools have been normal in color without signs of blood.  She does take Eliquis for her portal vein thrombosis and has been on that for quite some time.  She reports her last blood transfusion was in Oct.  The history is provided by the patient.       Home Medications Prior to Admission medications   Medication Sig Start Date End Date Taking? Authorizing Provider  denosumab (PROLIA) 60 MG/ML SOLN injection Inject 60 mg into the skin every 6 (six) months. Administer in upper arm, thigh, or abdomen    [provider]  ELIQUIS 5 MG TABS tablet TAKE 1 TABLET BY MOUTH TWICE A DAY 01/27/24   Rachel Moulds, MD  folic acid (FOLVITE) 1 MG tablet Take 1 mg by mouth daily.    [provider]  furosemide (LASIX) 20 MG tablet Take 20 mg by mouth daily. 06/03/23   [provider]  Milk Thistle 1000 MG CAPS Take 1,000 mg by mouth daily. 11/14/21   [provider]  palbociclib (IBRANCE) 75 MG tablet Take 1 tablet (75 mg total) by mouth daily. Take for 21 days on, 7 days off, repeat every 28 days. 10/02/23   Rachel Moulds, MD  pantoprazole  (PROTONIX) 40 MG tablet Take 40 mg by mouth daily.    [provider]  propranolol (INDERAL) 20 MG tablet Take 1 tablet (20 mg total) by mouth 2 (two) times daily. 09/07/17   Hongalgi, Maximino Greenland, MD  spironolactone (ALDACTONE) 25 MG tablet Take 25 mg by mouth daily. 06/03/23   [provider]  venlafaxine XR (EFFEXOR-XR) 150 MG 24 hr capsule Take 1 capsule (150 mg total) by mouth daily with breakfast. 08/28/23   Rachel Moulds, MD      Allergies    Patient has no known allergies.    Review of Systems   Review of Systems  Physical Exam Updated Vital Signs BP (!) 145/109   Pulse 97   Temp 98.1 F (36.7 C) (Oral)   Resp (!) 22   Ht 5\' 4"  (1.626 m)   Wt 56.7 kg   SpO2 97%   BMI 21.46 kg/m  Physical Exam Vitals and nursing note reviewed.  Constitutional:      General: She is not in acute distress.    Appearance: She is well-developed.  HENT:     Head: Normocephalic and atraumatic.     Right Ear: Drainage present.     Left Ear: Tympanic membrane normal.     Ears:     Comments: Exudate in the left ear canal Eyes:     Pupils:  Pupils are equal, round, and reactive to light.  Cardiovascular:     Rate and Rhythm: Regular rhythm. Tachycardia present.     Heart sounds: Murmur heard.     No friction rub.  Pulmonary:     Effort: Pulmonary effort is normal.     Breath sounds: Normal breath sounds. No wheezing or rales.  Abdominal:     General: Bowel sounds are normal. There is no distension.     Palpations: Abdomen is soft.     Tenderness: There is no abdominal tenderness. There is no guarding or rebound.  Musculoskeletal:        General: No tenderness. Normal range of motion.     Right lower leg: No edema.     Left lower leg: No edema.     Comments: No edema  Skin:    General: Skin is warm and dry.     Coloration: Skin is pale.     Findings: No rash.  Neurological:     Mental Status: She is alert and oriented to person, place, and time. Mental status is at  baseline.     Cranial Nerves: No cranial nerve deficit.  Psychiatric:        Behavior: Behavior normal.     ED Results / Procedures / Treatments   Labs (all labs ordered are listed, but only abnormal results are displayed) Labs Reviewed  CBC WITH DIFFERENTIAL/PLATELET - Abnormal; Notable for the following components:      Result Value   WBC 3.6 (*)    RBC 2.01 (*)    Hemoglobin 4.9 (*)    HCT 19.8 (*)    MCH 24.4 (*)    MCHC 24.7 (*)    RDW 21.2 (*)    Platelets 147 (*)    nRBC 1.4 (*)    Lymphs Abs 0.6 (*)    All other components within normal limits  COMPREHENSIVE METABOLIC PANEL - Abnormal; Notable for the following components:   CO2 16 (*)    Creatinine, Ser 1.13 (*)    Calcium 8.6 (*)    GFR, Estimated 53 (*)    All other components within normal limits  RESP PANEL BY RT-PCR (RSV, FLU A&B, COVID)  RVPGX2  POC OCCULT BLOOD, ED  TYPE AND SCREEN  PREPARE RBC (CROSSMATCH)    EKG EKG Interpretation Date/Time:  Wednesday March 03 2024 17:23:04 EST Ventricular Rate:  79 PR Interval:  121 QRS Duration:  91 QT Interval:  377 QTC Calculation: 433 R Axis:   35  Text Interpretation: Sinus rhythm Probable anteroseptal infarct, old Minimal ST depression, anterolateral leads No significant change since last tracing Confirmed by Gwyneth Sprout ([id number]) on 03/03/2024 7:03:39 PM  Radiology DG Chest 2 View Result Date: 03/03/2024 CLINICAL DATA:  History of breast cancer presenting with low hemoglobin and weakness. EXAM: CHEST - 2 VIEW COMPARISON:  October 01, 2022 FINDINGS: The heart size and mediastinal contours are within normal limits. Ill-defined surgical sutures are seen within the perihilar region on the left. Very mild areas of scarring and/or atelectasis are seen within the right lung base and in the mid to upper lung fields, bilaterally. There is a small right pleural effusion. A stable small to moderate size left pleural effusion is also seen. No pneumothorax is  identified. Radiopaque surgical clips are seen along the right axilla. Radiopaque vascular coils are seen within the medial aspect of the left upper quadrant. No acute osseous abnormalities are identified. IMPRESSION: 1. Very mild right basilar and bilateral  mid to upper lung field scarring and/or atelectasis. 2. Small right pleural effusion. 3. Stable small to moderate size left pleural effusion. Electronically Signed   By: Aram Candela M.D.   On: 03/03/2024 20:24    Procedures Procedures    Medications Ordered in ED Medications  ofloxacin (OCUFLOX) 0.3 % ophthalmic solution 5 drop (5 drops Left EAR Given 03/03/24 2048)  0.9 %  sodium chloride infusion (Manually program via Guardrails IV Fluids) (has no administration in time range)    ED Course/ Medical Decision Making/ A&P                                 Medical Decision Making Amount and/or Complexity of Data Reviewed External Data Reviewed: notes. Labs: ordered. Decision-making details documented in ED Course. Radiology: ordered and independent interpretation performed. Decision-making details documented in ED Course. ECG/medicine tests: ordered and independent interpretation performed. Decision-making details documented in ED Course.  Risk Prescription drug management. Decision regarding hospitalization.   Pt with multiple medical problems and comorbidities and presenting today with a complaint that caries a high risk for morbidity and mortality.  Here today due to generalized weakness and dyspnea on exertion in the setting of having a known history of cirrhosis and gave with recurrent anemia.  Patient is also on Eliquis for portal vein thrombosis.  Patient appears pale on exam but at rest denies any shortness of breath or chest pain. I independently interpreted patient's labs and EKG and today CBC shows a hemoglobin of 4.9 with mild leukopenia with a white count of 3.6 and a platelet count of 147.  CMP without acute findings.   EKG with sinus tachycardia but no other acute findings. Patient will need transfusion which she is agreeable to.  They try to keep her hemoglobin greater than 7.  She has not noticed any GI bleeding. I have independently visualized and interpreted pt's images today.  Chest x-ray with persistent effusion in the left lobe which is unchanged from prior. Findings discussed with the patient and blood was ordered.  Patient will require admission which she is comfortable with.  Consult to the hospitalist.  CRITICAL CARE Performed by: Brysan Mcevoy Total critical care time: 30 minutes Critical care time was exclusive of separately billable procedures and treating other patients. Critical care was necessary to treat or prevent imminent or life-threatening deterioration. Critical care was time spent personally by me on the following activities: development of treatment plan with patient and/or surrogate as well as nursing, discussions with consultants, evaluation of patient's response to treatment, examination of patient, obtaining history from patient or surrogate, ordering and performing treatments and interventions, ordering and review of laboratory studies, ordering and review of radiographic studies, pulse oximetry and re-evaluation of patient's condition.            Final Clinical Impression(s) / ED Diagnoses Final diagnoses:  Symptomatic anemia    Rx / DC Orders ED Discharge Orders     None         Gwyneth Sprout, MD 03/03/24 2127

## 2024-03-04 ENCOUNTER — Encounter (HOSPITAL_COMMUNITY): Payer: Self-pay | Admitting: Internal Medicine

## 2024-03-04 DIAGNOSIS — Z7901 Long term (current) use of anticoagulants: Secondary | ICD-10-CM | POA: Diagnosis not present

## 2024-03-04 DIAGNOSIS — C7800 Secondary malignant neoplasm of unspecified lung: Secondary | ICD-10-CM

## 2024-03-04 DIAGNOSIS — K7460 Unspecified cirrhosis of liver: Secondary | ICD-10-CM

## 2024-03-04 DIAGNOSIS — C50919 Malignant neoplasm of unspecified site of unspecified female breast: Secondary | ICD-10-CM

## 2024-03-04 DIAGNOSIS — E611 Iron deficiency: Secondary | ICD-10-CM

## 2024-03-04 DIAGNOSIS — D61818 Other pancytopenia: Secondary | ICD-10-CM

## 2024-03-04 DIAGNOSIS — E872 Acidosis, unspecified: Secondary | ICD-10-CM

## 2024-03-04 DIAGNOSIS — E876 Hypokalemia: Secondary | ICD-10-CM

## 2024-03-04 DIAGNOSIS — K219 Gastro-esophageal reflux disease without esophagitis: Secondary | ICD-10-CM | POA: Diagnosis not present

## 2024-03-04 DIAGNOSIS — D649 Anemia, unspecified: Secondary | ICD-10-CM | POA: Diagnosis not present

## 2024-03-04 LAB — CBC
HCT: 30.5 % — ABNORMAL LOW (ref 36.0–46.0)
Hemoglobin: 9 g/dL — ABNORMAL LOW (ref 12.0–15.0)
MCH: 27 pg (ref 26.0–34.0)
MCHC: 29.5 g/dL — ABNORMAL LOW (ref 30.0–36.0)
MCV: 91.6 fL (ref 80.0–100.0)
Platelets: 64 10*3/uL — ABNORMAL LOW (ref 150–400)
RBC: 3.33 MIL/uL — ABNORMAL LOW (ref 3.87–5.11)
RDW: 16.9 % — ABNORMAL HIGH (ref 11.5–15.5)
WBC: 2.3 10*3/uL — ABNORMAL LOW (ref 4.0–10.5)
nRBC: 2.2 % — ABNORMAL HIGH (ref 0.0–0.2)

## 2024-03-04 LAB — IRON AND TIBC
Iron: 236 ug/dL — ABNORMAL HIGH (ref 28–170)
Saturation Ratios: 37 % — ABNORMAL HIGH (ref 10.4–31.8)
TIBC: 634 ug/dL — ABNORMAL HIGH (ref 250–450)
UIBC: 398 ug/dL

## 2024-03-04 LAB — BASIC METABOLIC PANEL
Anion gap: 10 (ref 5–15)
BUN: 18 mg/dL (ref 8–23)
CO2: 20 mmol/L — ABNORMAL LOW (ref 22–32)
Calcium: 8.6 mg/dL — ABNORMAL LOW (ref 8.9–10.3)
Chloride: 107 mmol/L (ref 98–111)
Creatinine, Ser: 1.23 mg/dL — ABNORMAL HIGH (ref 0.44–1.00)
GFR, Estimated: 48 mL/min — ABNORMAL LOW (ref >60)
Glucose, Bld: 85 mg/dL (ref 70–99)
Potassium: 3.4 mmol/L — ABNORMAL LOW (ref 3.5–5.1)
Sodium: 137 mmol/L (ref 135–145)

## 2024-03-04 LAB — APTT: aPTT: 29 s (ref 24–36)

## 2024-03-04 LAB — PROTIME-INR
INR: 1.7 — ABNORMAL HIGH (ref 0.8–1.2)
Prothrombin Time: 19.9 s — ABNORMAL HIGH (ref 11.4–15.2)

## 2024-03-04 LAB — FOLATE: Folate: >40 ng/mL (ref >5.9)

## 2024-03-04 LAB — FERRITIN: Ferritin: 8 ng/mL — ABNORMAL LOW (ref 11–307)

## 2024-03-04 LAB — HIV ANTIBODY (ROUTINE TESTING W REFLEX): HIV Screen 4th Generation wRfx: NONREACTIVE

## 2024-03-04 LAB — VITAMIN B12: Vitamin B-12: 298 pg/mL (ref 180–914)

## 2024-03-04 MED ORDER — VENLAFAXINE HCL ER 150 MG PO CP24
150.0000 mg | ORAL_CAPSULE | Freq: Every day | ORAL | Status: DC
  Administered 2024-03-05: 150 mg via ORAL
  Filled 2024-03-04: qty 1

## 2024-03-04 MED ORDER — PROPRANOLOL HCL 20 MG PO TABS
20.0000 mg | ORAL_TABLET | Freq: Two times a day (BID) | ORAL | Status: DC
  Administered 2024-03-04 – 2024-03-05 (×2): 20 mg via ORAL
  Filled 2024-03-04 (×2): qty 1

## 2024-03-04 MED ORDER — FERROUS SULFATE 325 (65 FE) MG PO TABS
325.0000 mg | ORAL_TABLET | Freq: Every day | ORAL | Status: DC
  Administered 2024-03-05: 325 mg via ORAL
  Filled 2024-03-04: qty 1

## 2024-03-04 MED ORDER — FUROSEMIDE 20 MG PO TABS
20.0000 mg | ORAL_TABLET | Freq: Every day | ORAL | Status: DC
  Administered 2024-03-05: 20 mg via ORAL
  Filled 2024-03-04: qty 1

## 2024-03-04 MED ORDER — PANTOPRAZOLE SODIUM 40 MG PO TBEC
40.0000 mg | DELAYED_RELEASE_TABLET | Freq: Every day | ORAL | Status: DC
  Administered 2024-03-04 – 2024-03-05 (×2): 40 mg via ORAL
  Filled 2024-03-04 (×2): qty 1

## 2024-03-04 MED ORDER — POTASSIUM CHLORIDE CRYS ER 20 MEQ PO TBCR
40.0000 meq | EXTENDED_RELEASE_TABLET | Freq: Once | ORAL | Status: AC
  Administered 2024-03-04: 40 meq via ORAL
  Filled 2024-03-04: qty 2

## 2024-03-04 MED ORDER — APIXABAN 5 MG PO TABS
5.0000 mg | ORAL_TABLET | Freq: Two times a day (BID) | ORAL | Status: DC
  Administered 2024-03-04 – 2024-03-05 (×2): 5 mg via ORAL
  Filled 2024-03-04 (×2): qty 1

## 2024-03-04 MED ORDER — SPIRONOLACTONE 25 MG PO TABS
25.0000 mg | ORAL_TABLET | Freq: Every day | ORAL | Status: DC
  Administered 2024-03-05: 25 mg via ORAL
  Filled 2024-03-04: qty 1

## 2024-03-04 NOTE — Plan of Care (Signed)
   Problem: Education: Goal: Knowledge of General Education information will improve Description Including pain rating scale, medication(s)/side effects and non-pharmacologic comfort measures Outcome: Progressing   Problem: Health Behavior/Discharge Planning: Goal: Ability to manage health-related needs will improve Outcome: Progressing

## 2024-03-04 NOTE — Progress Notes (Signed)
 PROGRESS NOTE   Nicole [name]  ZOX:[id number] DOB: 08/20/1956 DOA: 03/03/2024 PCP: Patient, No Pcp Per   Date of Service: the patient was seen and examined on 03/04/2024  Brief Narrative:  68 y.o. female with medical history of stage IV metastatic breast cancer diagnosed in 2011 status post right mastectomy chemotherapy and radiation following with Dr. 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 Marca Ancona 09/2023) and ascites presenting to [facility] emergency room with complaints of shortness of breath.  Upon evaluation in the emergency department patient was found to be pancytopenic with hemoglobin of 5.2.  No evidence of active bleeding.  Hemoccult was found to be negative.  2 unit packed red blood cell transfusion was initiated in the emergency department.  The hospitalist group was then called to assess the patient for admission of the hospital.    Assessment & Plan Symptomatic anemia Patient presenting with pancytopenia with hemoglobin As low as 4.9 with associated symptoms of shortness of breath and weakness over the past several weeks. Patient is status post 2 units of packed red blood cells, repeat hemoglobin of 9  Stool Hemoccult negative making gastrointestinal source unlikely, this is extremely reassuring considering patient's history of GAVE and varices Considering concurrent pancytopenia, concern that this development is l possibly medication induced with patient being on both Prolia and Faalsodex, both of which can cause anemia Eliquis was temporarily held on admission, will resume this Continue to monitor hemoglobin and hematocrit This is acute on chrnoic attributed to  cirrhosis and portal hypertension and bone marrow suppression from [place].  In the past. Iron deficiency Atypical workup with both elevated TIBC and elevated iron saturation.  However, extremely low ferritin is almost  always consistent with iron deficiency.  Suspect abnormally elevated iron saturation due to some type of dysregulation of iron metabolism.   Patient would likely benefit from daily iron therapy at time of discharge, particularly in the setting of GAVE and esophageal varices  Pancytopenia (HCC) See assessment and plan above Breast cancer metastasized to lung [facility]) Continue outpatient follow-up with Dr.Iruku   Metabolic acidosis Resolving Idiopathic cirrhosis (HCC) Resume home regimen of spironolactone and furosemide GERD without esophagitis Continue Protonix Chronic anticoagulation Resume Eliquis, monitor for any evidence of bleeding Hypokalemia Replacing with potassium chloride Evaluating for concurrent hypomagnesemia  Monitoring potassium levels with serial chemistries.    Subjective:  Patient states that she is feeling much stronger than yesterday.  Patient denies any associated shortness of breath or chest pain.  Physical Exam:  Vitals:   03/04/24 0857 03/04/24 0903 03/04/24 0930 03/04/24 1030  BP: 125/67 125/67 101/81 125/80  Pulse: 78 78 80 73  Resp: 19 18 20  (!) 21  Temp: 98.1 F (36.7 C) 98.1 F (36.7 C) 98.1 F (36.7 C)   TempSrc: Oral Oral Oral   SpO2: 99%  100% 100%  Weight:      Height:        Constitutional: Awake alert and oriented x3, no associated distress.   Skin: no rashes, no lesions, good skin turgor noted.  Increased skin pallor. Eyes: Pupils are equally reactive to light.  Significant conjunctival pallor still present. ENMT: Moist mucous membranes noted.  Posterior pharynx clear of any exudate or lesions.   Respiratory: clear to auscultation bilaterally, no wheezing, no crackles. Normal respiratory effort. No accessory muscle use.  Cardiovascular: Regular rate and rhythm, no murmurs / rubs / gallops. No extremity edema.  2+ pedal pulses. No carotid bruits.  Abdomen: Abdomen is soft and nontender.  No evidence of intra-abdominal masses.  Positive  bowel sounds noted in all quadrants.   Musculoskeletal: No joint deformity upper and lower extremities. Good ROM, no contractures. Normal muscle tone.    Data Reviewed:  I have personally reviewed and interpreted labs, imaging.  Significant findings are   CBC: Recent Labs  Lab 03/03/24 1349 03/03/24 1850  WBC 3.2* 3.6*  NEUTROABS 2.4 2.4  HGB 5.2* 4.9*  HCT 19.0* 19.8*  MCV 91.3 98.5  PLT 124* 147*   Basic Metabolic Panel: Recent Labs  Lab 03/03/24 1349 03/03/24 1850  NA 138 139  K 3.7 4.1  CL 107 110  CO2 20* 16*  GLUCOSE 135* 89  BUN 16 17  CREATININE 1.05* 1.13*  CALCIUM 8.6* 8.6*   GFR: Estimated Creatinine Clearance: 40.6 mL/min (A) (by C-G formula based on SCr of 1.13 mg/dL (H)). Liver Function Tests: Recent Labs  Lab 03/03/24 1349 03/03/24 1850  AST 17 23  ALT 13 17  ALKPHOS 61 56  BILITOT 0.6 0.9  PROT 6.8 6.8  ALBUMIN 4.0 3.6     Code Status:  DNR.  Code status decision has been confirmed with: patient    Severity of Illness:  The appropriate patient status for this patient is OBSERVATION. Observation status is judged to be reasonable and necessary in order to provide the required intensity of service to ensure the patient's safety. The patient's presenting symptoms, physical exam findings, and initial radiographic and laboratory data in the context of their medical condition is felt to place them at decreased risk for further clinical deterioration. Furthermore, it is anticipated that the patient will be medically stable for discharge from the hospital within 2 midnights of admission.   Time spent:  51 minutes  Author:  Marinda Elk MD  03/04/2024 10:47 AM

## 2024-03-04 NOTE — Hospital Course (Addendum)
 68 y.o. female with medical history of stage IV metastatic breast cancer diagnosed in 2011 status post right mastectomy chemotherapy and radiation following with Dr. 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 Marca Ancona 09/2023) and ascites presenting to [facility] emergency room with complaints of shortness of breath.  Upon evaluation in the emergency department patient was found to be pancytopenic with hemoglobin of 5.2.  No evidence of active bleeding.  Hemoccult was found to be negative.  2 unit packed red blood cell transfusion was initiated in the emergency department.  The hospitalist group was then called to assess the patient for admission of the hospital.  Patient received 2 units of packed red blood cells without complication.  Hemoglobin increased appropriately.  Patient exhibited no evidence of bleeding throughout the hospitalization.  Workup, including stool Hemoccult was found to be negative.  Iron panel was a little atypical with an elevated iron saturation, elevated TIBC and extremely low ferritin however decision was made to place patient on daily ferrous sulfate supplementation.  After thorough workup it was felt that the patient may be suffering from medication induced pancytopenia.  While those Faslodex and Ilda Foil can both be associated with severe anemia, patient reports being started on Ibrance approximately 3 months ago which may be the culprit.  At time of discharge, message was sent to patient's oncologist surrounding the potential association between the anemia and the patient's medication.  Patient is being discharged home in improved and stable condition on 03/05/2024.

## 2024-03-04 NOTE — Progress Notes (Signed)
 RN provided pt's home meds to pharmacy and educated to pick meds up when discharged.

## 2024-03-04 NOTE — Assessment & Plan Note (Signed)
 Atypical workup with both elevated TIBC and elevated iron saturation.  However, extremely low ferritin is almost always consistent with iron deficiency.  Suspect abnormally elevated iron saturation due to some type of dysregulation of iron metabolism.   Patient would likely benefit from daily iron therapy at time of discharge, particularly in the setting of GAVE and esophageal varices

## 2024-03-04 NOTE — Assessment & Plan Note (Signed)
-  Continue Protonix

## 2024-03-04 NOTE — Assessment & Plan Note (Signed)
·   Replacing with potassium chloride °· Evaluating for concurrent hypomagnesemia  °· Monitoring potassium levels with serial chemistries. ° °

## 2024-03-04 NOTE — Assessment & Plan Note (Signed)
See assessment and plan above

## 2024-03-04 NOTE — ED Notes (Signed)
 Can go up after blood is started

## 2024-03-04 NOTE — Assessment & Plan Note (Signed)
 Resume Eliquis, monitor for any evidence of bleeding

## 2024-03-04 NOTE — Assessment & Plan Note (Signed)
 Resume home regimen of spironolactone and furosemide

## 2024-03-04 NOTE — Plan of Care (Signed)

## 2024-03-05 ENCOUNTER — Encounter: Payer: Self-pay | Admitting: Hematology and Oncology

## 2024-03-05 ENCOUNTER — Other Ambulatory Visit (HOSPITAL_COMMUNITY): Payer: Self-pay

## 2024-03-05 DIAGNOSIS — K219 Gastro-esophageal reflux disease without esophagitis: Secondary | ICD-10-CM | POA: Diagnosis not present

## 2024-03-05 DIAGNOSIS — D649 Anemia, unspecified: Secondary | ICD-10-CM | POA: Diagnosis not present

## 2024-03-05 DIAGNOSIS — Z7901 Long term (current) use of anticoagulants: Secondary | ICD-10-CM | POA: Diagnosis not present

## 2024-03-05 DIAGNOSIS — E876 Hypokalemia: Secondary | ICD-10-CM | POA: Diagnosis not present

## 2024-03-05 LAB — CBC WITH DIFFERENTIAL/PLATELET
Abs Immature Granulocytes: 0.02 10*3/uL (ref 0.00–0.07)
Basophils Absolute: 0 10*3/uL (ref 0.0–0.1)
Basophils Relative: 1 %
Eosinophils Absolute: 0 10*3/uL (ref 0.0–0.5)
Eosinophils Relative: 2 %
HCT: 29.4 % — ABNORMAL LOW (ref 36.0–46.0)
Hemoglobin: 8.8 g/dL — ABNORMAL LOW (ref 12.0–15.0)
Immature Granulocytes: 1 %
Lymphocytes Relative: 10 %
Lymphs Abs: 0.3 10*3/uL — ABNORMAL LOW (ref 0.7–4.0)
MCH: 27.2 pg (ref 26.0–34.0)
MCHC: 29.9 g/dL — ABNORMAL LOW (ref 30.0–36.0)
MCV: 91 fL (ref 80.0–100.0)
Monocytes Absolute: 0.4 10*3/uL (ref 0.1–1.0)
Monocytes Relative: 18 %
Neutro Abs: 1.7 10*3/uL (ref 1.7–7.7)
Neutrophils Relative %: 68 %
Platelets: 52 10*3/uL — ABNORMAL LOW (ref 150–400)
RBC: 3.23 MIL/uL — ABNORMAL LOW (ref 3.87–5.11)
RDW: 17.1 % — ABNORMAL HIGH (ref 11.5–15.5)
Smear Review: NORMAL
WBC: 2.5 10*3/uL — ABNORMAL LOW (ref 4.0–10.5)
nRBC: 0.8 % — ABNORMAL HIGH (ref 0.0–0.2)

## 2024-03-05 LAB — TYPE AND SCREEN
ABO/RH(D): O POS
Antibody Screen: POSITIVE
Donor AG Type: NEGATIVE
Donor AG Type: NEGATIVE
Unit division: 0
Unit division: 0

## 2024-03-05 LAB — COMPREHENSIVE METABOLIC PANEL
ALT: 18 U/L (ref 0–44)
AST: 23 U/L (ref 15–41)
Albumin: 3.4 g/dL — ABNORMAL LOW (ref 3.5–5.0)
Alkaline Phosphatase: 60 U/L (ref 38–126)
Anion gap: 10 (ref 5–15)
BUN: 19 mg/dL (ref 8–23)
CO2: 21 mmol/L — ABNORMAL LOW (ref 22–32)
Calcium: 8.3 mg/dL — ABNORMAL LOW (ref 8.9–10.3)
Chloride: 106 mmol/L (ref 98–111)
Creatinine, Ser: 1.13 mg/dL — ABNORMAL HIGH (ref 0.44–1.00)
GFR, Estimated: 53 mL/min — ABNORMAL LOW (ref >60)
Glucose, Bld: 114 mg/dL — ABNORMAL HIGH (ref 70–99)
Potassium: 4.8 mmol/L (ref 3.5–5.1)
Sodium: 137 mmol/L (ref 135–145)
Total Bilirubin: 2 mg/dL — ABNORMAL HIGH (ref 0.0–1.2)
Total Protein: 6.3 g/dL — ABNORMAL LOW (ref 6.5–8.1)

## 2024-03-05 LAB — BPAM RBC
Blood Product Expiration Date: 202504102359
Blood Product Expiration Date: 202504122359
ISSUE DATE / TIME: 202503060429
ISSUE DATE / TIME: 202503060853
Unit Type and Rh: 5100
Unit Type and Rh: 5100

## 2024-03-05 LAB — HAPTOGLOBIN: Haptoglobin: 63 mg/dL (ref 37–355)

## 2024-03-05 LAB — MAGNESIUM: Magnesium: 2.3 mg/dL (ref 1.7–2.4)

## 2024-03-05 MED ORDER — FERROUS SULFATE 325 (65 FE) MG PO TABS
325.0000 mg | ORAL_TABLET | Freq: Every day | ORAL | 0 refills | Status: AC
  Filled 2024-03-05: qty 90, 90d supply, fill #0

## 2024-03-05 NOTE — Discharge Summary (Signed)
 Physician Discharge Summary   Patient: Nicole [name] MRN: [id number] DOB: 09/18/1956  Admit date:     03/03/2024  Discharge date: 03/05/24  Discharge Physician: Marinda Elk   PCP: Patient, No Pcp Per   Recommendations at discharge:    Please take all prescribed medications exactly as instructed including taking your daily iron supplementation. Speak to your oncologist about your outpatient regimen of Ibrance or Faslodex as both can sometimes be associated with severe anemia. When taking daily iron supplementation, try to take it on empty stomach approximate 20 to 30 minutes prior to meal.  It may help to take a cup of juice with it to improve absorption. Please consume a regular diet.  Please avoid significant spicy food or alcohol use. Please avoid blood thinners such as aspirin or NSAIDs. Please increase your physical activity as tolerated. Please maintain all outpatient follow-up appointments including follow-up with your oncologist Dr. Al Pimple. Please return to the emergency department if you develop worsening shortness of breath, fevers in excess of 100.4 F, weakness or inability to tolerate oral intake.    Discharge Diagnoses: Principal Problem:   Symptomatic anemia Active Problems:   Breast cancer metastasized to lung (HCC)   Idiopathic cirrhosis (HCC)   Metabolic acidosis   Pancytopenia (HCC)   GERD without esophagitis   Chronic anticoagulation   Iron deficiency   Hypokalemia  Resolved Problems:   * No resolved hospital problems. *   Hospital Course: 68 y.o. female with medical history of stage IV metastatic breast cancer diagnosed in 2011 status post right mastectomy chemotherapy and radiation following with Dr. 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 Marca Ancona 09/2023) and ascites presenting to [facility] emergency room with complaints of shortness of  breath.  Upon evaluation in the emergency department patient was found to be pancytopenic with hemoglobin of 5.2.  No evidence of active bleeding.  Hemoccult was found to be negative.  2 unit packed red blood cell transfusion was initiated in the emergency department.  The hospitalist group was then called to assess the patient for admission of the hospital.  Patient received 2 units of packed red blood cells without complication.  Hemoglobin increased appropriately.  Patient exhibited no evidence of bleeding throughout the hospitalization.  Workup, including stool Hemoccult was found to be negative.  Iron panel was a little atypical with an elevated iron saturation, elevated TIBC and extremely low ferritin however decision was made to place patient on daily ferrous sulfate supplementation.  After thorough workup it was felt that the patient may be suffering from medication induced pancytopenia.  While those Faslodex and Ilda Foil can both be associated with severe anemia, patient reports being started on Ibrance approximately 3 months ago which may be the culprit.  At time of discharge, message was sent to patient's oncologist surrounding the potential association between the anemia and the patient's medication.  Patient is being discharged home in improved and stable condition on 03/05/2024.   Consultants: None Procedures performed: none  Disposition: Home Diet recommendation:  Discharge Diet Orders (From admission, onward)     Start     Ordered   03/05/24 0000  Diet general        03/05/24 1508           Regular diet  DISCHARGE MEDICATION: Allergies as of 03/05/2024   No Known Allergies      Medication List     PAUSE  taking these medications    fulvestrant 250 MG/5ML injection Wait to take this until your doctor or other care provider tells you to start again. Commonly known as: FASLODEX Inject 250-500 mg into the muscle See admin instructions. Loading dose of 500mg  on days  1,15, and 29. THEN 250mg  every 28 days thereafter   palbociclib 75 MG tablet Wait to take this until your doctor or other care provider tells you to start again. Commonly known as: IBRANCE Take 1 tablet (75 mg total) by mouth daily. Take for 21 days on, 7 days off, repeat every 28 days.       TAKE these medications    denosumab 60 MG/ML Soln injection Commonly known as: PROLIA Inject 60 mg into the skin every 6 (six) months. Administer in upper arm, thigh, or abdomen   Eliquis 5 MG Tabs tablet Generic drug: apixaban TAKE 1 TABLET BY MOUTH TWICE A DAY   ferrous sulfate 325 (65 FE) MG tablet Take 1 tablet (325 mg total) by mouth daily before breakfast. Start taking on: March 06, 2024   furosemide 20 MG tablet Commonly known as: LASIX Take 20 mg by mouth daily.   Milk Thistle 1000 MG Caps Take 1,000 mg by mouth daily.   pantoprazole 40 MG tablet Commonly known as: PROTONIX Take 40 mg by mouth daily.   propranolol 20 MG tablet Commonly known as: INDERAL Take 1 tablet (20 mg total) by mouth 2 (two) times daily.   spironolactone 25 MG tablet Commonly known as: ALDACTONE Take 25 mg by mouth daily.   venlafaxine XR 150 MG 24 hr capsule Commonly known as: EFFEXOR-XR Take 1 capsule (150 mg total) by mouth daily with breakfast.        Follow-up Information     Rachel Moulds, MD Follow up.   Specialty: Hematology and Oncology Contact information: [address] [place] [address] [phone]                 Discharge Exam: Ceasar Mons Weights   03/03/24 1714  Weight: 56.7 kg    Constitutional: Awake alert and oriented x3, no associated distress.   Respiratory: clear to auscultation bilaterally, no wheezing, no crackles. Normal respiratory effort. No accessory muscle use.  Cardiovascular: Regular rate and rhythm, no murmurs / rubs / gallops. No extremity edema. 2+ pedal pulses. No carotid bruits.  Abdomen: Abdomen is soft and nontender.  No evidence of  intra-abdominal masses.  Positive bowel sounds noted in all quadrants.   Musculoskeletal: No joint deformity upper and lower extremities. Good ROM, no contractures. Normal muscle tone.     Condition at discharge: fair  The results of significant diagnostics from this hospitalization (including imaging, microbiology, ancillary and laboratory) are listed below for reference.   Imaging Studies: DG Chest 2 View Result Date: 03/03/2024 CLINICAL DATA:  History of breast cancer presenting with low hemoglobin and weakness. EXAM: CHEST - 2 VIEW COMPARISON:  October 01, 2022 FINDINGS: The heart size and mediastinal contours are within normal limits. Ill-defined surgical sutures are seen within the perihilar region on the left. Very mild areas of scarring and/or atelectasis are seen within the right lung base and in the mid to upper lung fields, bilaterally. There is a small right pleural effusion. A stable small to moderate size left pleural effusion is also seen. No pneumothorax is identified. Radiopaque surgical clips are seen along the right axilla. Radiopaque vascular coils are seen within the medial aspect of the left upper quadrant. No acute osseous  abnormalities are identified. IMPRESSION: 1. Very mild right basilar and bilateral mid to upper lung field scarring and/or atelectasis. 2. Small right pleural effusion. 3. Stable small to moderate size left pleural effusion. Electronically Signed   By: Aram Candela M.D.   On: 03/03/2024 20:24    Microbiology: Results for orders placed or performed during the hospital encounter of 03/03/24  Resp panel by RT-PCR (RSV, Flu A&B, Covid) Anterior Nasal Swab     Status: None   Collection Time: 03/03/24  5:24 PM   Specimen: Anterior Nasal Swab  Result Value Ref Range Status   SARS Coronavirus 2 by RT PCR NEGATIVE NEGATIVE Final    Comment: (NOTE) SARS-CoV-2 target nucleic acids are NOT DETECTED.  The SARS-CoV-2 RNA is generally detectable in upper  respiratory specimens during the acute phase of infection. The lowest concentration of SARS-CoV-2 viral copies this assay can detect is 138 copies/mL. A negative result does not preclude SARS-Cov-2 infection and should not be used as the sole basis for treatment or other patient management decisions. A negative result may occur with  improper specimen collection/handling, submission of specimen other than nasopharyngeal swab, presence of viral mutation(s) within the areas targeted by this assay, and inadequate number of viral copies(<138 copies/mL). A negative result must be combined with clinical observations, patient history, and epidemiological information. The expected result is Negative.  Fact Sheet for Patients:  BloggerCourse.com  Fact Sheet for Healthcare Providers:  SeriousBroker.it  This test is no t yet approved or cleared by the [place] FDA and  has been authorized for detection and/or diagnosis of SARS-CoV-2 by FDA under an Emergency Use Authorization (EUA). This EUA will remain  in effect (meaning this test can be used) for the duration of the COVID-19 declaration under Section 564(b)(1) of the Act, 21 U.S.C.section 360bbb-3(b)(1), unless the authorization is terminated  or revoked sooner.       Influenza A by PCR NEGATIVE NEGATIVE Final   Influenza B by PCR NEGATIVE NEGATIVE Final    Comment: (NOTE) The Xpert Xpress SARS-CoV-2/FLU/RSV plus assay is intended as an aid in the diagnosis of influenza from Nasopharyngeal swab specimens and should not be used as a sole basis for treatment. Nasal washings and aspirates are unacceptable for Xpert Xpress SARS-CoV-2/FLU/RSV testing.  Fact Sheet for Patients: BloggerCourse.com  Fact Sheet for Healthcare Providers: SeriousBroker.it  This test is not yet approved or cleared by the [place] FDA and has been  authorized for detection and/or diagnosis of SARS-CoV-2 by FDA under an Emergency Use Authorization (EUA). This EUA will remain in effect (meaning this test can be used) for the duration of the COVID-19 declaration under Section 564(b)(1) of the Act, 21 U.S.C. section 360bbb-3(b)(1), unless the authorization is terminated or revoked.     Resp Syncytial Virus by PCR NEGATIVE NEGATIVE Final    Comment: (NOTE) Fact Sheet for Patients: BloggerCourse.com  Fact Sheet for Healthcare Providers: SeriousBroker.it  This test is not yet approved or cleared by the [place] FDA and has been authorized for detection and/or diagnosis of SARS-CoV-2 by FDA under an Emergency Use Authorization (EUA). This EUA will remain in effect (meaning this test can be used) for the duration of the COVID-19 declaration under Section 564(b)(1) of the Act, 21 U.S.C. section 360bbb-3(b)(1), unless the authorization is terminated or revoked.  Performed at [facility], [address] [place], [place] [address]     Labs: CBC: Recent Labs  Lab 03/03/24 1349 03/03/24 1850 03/04/24 1412 03/05/24  0413  WBC 3.2* 3.6* 2.3* 2.5*  NEUTROABS 2.4 2.4  --  1.7  HGB 5.2* 4.9* 9.0* 8.8*  HCT 19.0* 19.8* 30.5* 29.4*  MCV 91.3 98.5 91.6 91.0  PLT 124* 147* 64* 52*   Basic Metabolic Panel: Recent Labs  Lab 03/03/24 1349 03/03/24 1850 03/04/24 1412 03/05/24 0413  NA 138 139 137 137  K 3.7 4.1 3.4* 4.8  CL 107 110 107 106  CO2 20* 16* 20* 21*  GLUCOSE 135* 89 85 114*  BUN 16 17 18 19   CREATININE 1.05* 1.13* 1.23* 1.13*  CALCIUM 8.6* 8.6* 8.6* 8.3*  MG  --   --   --  2.3   Liver Function Tests: Recent Labs  Lab 03/03/24 1349 03/03/24 1850 03/05/24 0413  AST 17 23 23   ALT 13 17 18   ALKPHOS 61 56 60  BILITOT 0.6 0.9 2.0*  PROT 6.8 6.8 6.3*  ALBUMIN 4.0 3.6 3.4*   CBG: No results for input(s): "GLUCAP" in the last 168  hours.  Discharge time spent: greater than 30 minutes.  Signed: Marinda Elk, MD [facility] 03/05/2024

## 2024-03-05 NOTE — Progress Notes (Signed)
   03/05/24 0948  TOC Brief Assessment  Insurance and Status Reviewed  Patient has primary care physician No  Home environment has been reviewed Resides in single family home with spouse  Prior level of function: Independent with ADLs at baseline  Prior/Current Home Services No current home services  Social Drivers of Health Review SDOH reviewed no interventions necessary  Readmission risk has been reviewed Yes  Transition of care needs no transition of care needs at this time

## 2024-03-05 NOTE — Progress Notes (Signed)
 Discharge instructions reviewed. The patient denied questions, or concerns at this time. She is stable, a&ox4 and ambulatory with out assistance. No change from am assessment.

## 2024-03-05 NOTE — Care Management Obs Status (Signed)
 MEDICARE OBSERVATION STATUS NOTIFICATION   Patient Details  Name: Nicole [name] MRN: [id number] Date of Birth: 06-Apr-1956   Medicare Observation Status Notification Given:  Yes    Ewing Schlein, LCSW 03/05/2024, 9:39 AM

## 2024-03-05 NOTE — Discharge Instructions (Signed)
 Please take all prescribed medications exactly as instructed including taking your daily iron supplementation. When taking daily iron supplementation, try to take it on empty stomach approximate 20 to 30 minutes prior to meal.  It may help to take a cup of juice with it to improve absorption. Please consume a regular diet.  Please avoid significant spicy food or alcohol use. Please avoid blood thinners such as aspirin or NSAIDs. Please increase your physical activity as tolerated. Please maintain all outpatient follow-up appointments including follow-up with your oncologist Dr. Al Pimple. Please return to the emergency department if you develop worsening shortness of breath, fevers in excess of 100.4 F, weakness or inability to tolerate oral intake.

## 2024-03-08 ENCOUNTER — Telehealth: Payer: Self-pay | Admitting: *Deleted

## 2024-03-08 LAB — PROTEIN ELECTROPHORESIS, SERUM
A/G Ratio: 1.3 (ref 0.7–1.7)
Albumin ELP: 3.4 g/dL (ref 2.9–4.4)
Alpha-1-Globulin: 0.4 g/dL (ref 0.0–0.4)
Alpha-2-Globulin: 0.6 g/dL (ref 0.4–1.0)
Beta Globulin: 1.1 g/dL (ref 0.7–1.3)
Gamma Globulin: 0.6 g/dL (ref 0.4–1.8)
Globulin, Total: 2.6 g/dL (ref 2.2–3.9)
Total Protein ELP: 6 g/dL (ref 6.0–8.5)

## 2024-03-08 NOTE — Telephone Encounter (Signed)
 Per MD request appts made for lab and visit on 03/10/2024 to follow up inpt concerns. Spoke with the patient's husband with pt present for call- both in agreement to plan.

## 2024-03-09 ENCOUNTER — Telehealth: Payer: Self-pay

## 2024-03-09 NOTE — Telephone Encounter (Signed)
 Spoke with patient and confirmed appointment for 03/10/24

## 2024-03-09 NOTE — Progress Notes (Unsigned)
 [facility]  [phone] [phone]    ID: JAZIA FARACI   DOB: 10-05-1956  MR#: [id number]  ZDG#:[id number]  Patient Care Team: Patient, No Pcp Per as PCP - General (General Practice) Annamarie Major, CRNP as Nurse Practitioner (Nurse Practitioner) [name], MD as Consulting Physician (General Surgery) Kerin Salen, MD as Consulting Physician (Gastroenterology)  CHIEF COMPLAINT:  Metastatic Breast Cancer (s/p right mastectomy)  CURRENT TREATMENT: Ibrance and Faslodex, prolia every 6 months.  INTERVAL HISTORY:  Discussed the use of AI scribe software for clinical note transcription with the patient, who gave verbal consent to proceed.  History of Present Illness    The patient, with a history of breast cancer, cirrhosis, and portal vein thrombosis, is here for hospital follow up. She was recently admitted with severe anemia, required PRBC transfusion. Last PET imaging with no overt evidence of metastatic disease.    COVID 19 VACCINATION STATUS: Status post Pfizer x2+ booster August 2021   BREAST CANCER HISTORY: From Dr. Theron Arista Rubin's 07/25/2010 note:  "This woman has not had any significant medical intervention for some time.  Her last mammogram was about seven years ago.  She noted a right breast mass about a year ago and did not seek immediate medical attention for this.  She ultimately, after some time, admitted this to her husband and self-referred herself for intervention.  She had a mammogram on 07/17/2010 with an ultrasound of the right breast.  This showed a lobulated mass upper right outer quadrant measuring at least 6.3 x 7.3 cm and large right axillary lymph nodes were also noted.  There was skin thickening overlying the mass.  On physical exam, this mass was about an 8 cm with skin dimpling noted. Discolored area in the skin over the mass, fullness in the axilla.  Biopsy was recommended, which took place on 07/17/2010.  Pathology  showed invasive ductal cancer involving both lymph node and breast.  The HER-2 was not amplified.  ER and PR both positive at 100% and 6% respectively.  Proliferative index was 12% involving both the lymph node and breast mass.  An MRI scan of both breasts was performed on 07/22/2010, essentially which showed a large heterogeneously enhancing mass of the right breast with washout kinetics measuring 6.4 x 4.4 x 5.0 cm.  Numerous satellite nodules were seen throughout the dominant mass.  There were several suspicious nodules in the upper central and upper inner quadrant of the right breast.  There were two enhancing subcentimeter nodules seen in the right pectoralis muscle.  No obvious chest wall nodules were seen; however, there was seen some metastatic adenopathy in the mediastinum as well as hila with a 2.4 x 2.0 cm mediastinal lymph node as well as 1.2 x 3.0 cm subcarinal lymph node.  There was a right hilar and probable left hilar adenopathy.  Bilateral pulmonary nodules were seen and a right T2 lesion was seen anterior right liver as well."  Her subsequent history is as detailed below.   PAST MEDICAL HISTORY: Past Medical History:  Diagnosis Date   Anemia    low iron   Arthritis    fingers   Blood transfusion 2012   Breast CA (HCC)    breast ca dx 06/2010, stage 4, right , Lung Metasis.Surgery, Chemo, Radiation   Breast cancer (HCC)    Clot    jugular  ?2015   Depression    Esophageal varices (HCC)    Headache    Mirgraine- rare since  menopause   Heart murmur    mild, no cardiologist, from birth   Hematemesis 08/2017   History of kidney stones    no pain- shows in CT- small 1 mm bilaterally- kidney   Hypertension    IBD (inflammatory bowel disease)    resolved   Idiopathic cirrhosis (HCC)    pt denies   Peripheral vascular disease (HCC) few yrs ago   clot in right jugular   Portal hypertension (HCC)    Radiation 07/21/12-09/07/12   5940 cGy   Restless legs     PAST SURGICAL  HISTORY: Past Surgical History:  Procedure Laterality Date   BIOPSY  08/21/2020   Procedure: BIOPSY;  Surgeon: Kerin Salen, MD;  Location: [facility] ENDOSCOPY;  Service: Gastroenterology;;   COLONOSCOPY  2016   DILATION AND CURETTAGE OF UTERUS  2004?   ESOPHAGEAL BANDING  09/04/2017   Procedure: ESOPHAGEAL BANDING;  Surgeon: Kerin Salen, MD;  Location: [facility] ENDOSCOPY;  Service: Gastroenterology;;   ESOPHAGEAL BANDING  11/27/2017   Procedure: ESOPHAGEAL BANDING;  Surgeon: Kerin Salen, MD;  Location: [facility] ENDOSCOPY;  Service: Gastroenterology;;   ESOPHAGEAL BANDING N/A 04/28/2018   Procedure: ESOPHAGEAL BANDING;  Surgeon: Kerin Salen, MD;  Location: [facility] ENDOSCOPY;  Service: Gastroenterology;  Laterality: N/A;   ESOPHAGEAL BANDING  11/17/2018   Procedure: ESOPHAGEAL BANDING;  Surgeon: Kerin Salen, MD;  Location: [facility] ENDOSCOPY;  Service: Gastroenterology;;   ESOPHAGEAL BANDING  08/16/2019   Procedure: ESOPHAGEAL BANDING;  Surgeon: Kerin Salen, MD;  Location: [facility] ENDOSCOPY;  Service: Gastroenterology;;   ESOPHAGEAL BANDING N/A 08/21/2020   Procedure: ESOPHAGEAL BANDING;  Surgeon: Kerin Salen, MD;  Location: [facility] ENDOSCOPY;  Service: Gastroenterology;  Laterality: N/A;   ESOPHAGEAL BANDING  10/21/2023   Procedure: ESOPHAGEAL BANDING;  Surgeon: Kerin Salen, MD;  Location: [facility] ENDOSCOPY;  Service: Gastroenterology;;   ESOPHAGOGASTRODUODENOSCOPY N/A 04/28/2018   Procedure: ESOPHAGOGASTRODUODENOSCOPY (EGD);  Surgeon: Kerin Salen, MD;  Location: [facility] ENDOSCOPY;  Service: Gastroenterology;  Laterality: N/A;   ESOPHAGOGASTRODUODENOSCOPY N/A 11/17/2018   Procedure: ESOPHAGOGASTRODUODENOSCOPY (EGD);  Surgeon: Kerin Salen, MD;  Location: [facility] ENDOSCOPY;  Service: Gastroenterology;  Laterality: N/A;   ESOPHAGOGASTRODUODENOSCOPY N/A 08/13/2022   Procedure: ESOPHAGOGASTRODUODENOSCOPY (EGD);  Surgeon: Kerin Salen, MD;  Location: [facility] ENDOSCOPY;  Service: Gastroenterology;  Laterality: N/A;   ESOPHAGOGASTRODUODENOSCOPY (EGD) WITH PROPOFOL  N/A 04/11/2016   Procedure: ESOPHAGOGASTRODUODENOSCOPY (EGD) WITH PROPOFOL;  Surgeon: Vida Rigger, MD;  Location: [facility] ENDOSCOPY;  Service: Endoscopy;  Laterality: N/A;   ESOPHAGOGASTRODUODENOSCOPY (EGD) WITH PROPOFOL N/A 07/08/2016   Procedure: ESOPHAGOGASTRODUODENOSCOPY (EGD) WITH PROPOFOL;  Surgeon: Carman Ching, MD;  Location: [facility] ENDOSCOPY;  Service: Endoscopy;  Laterality: N/A;   ESOPHAGOGASTRODUODENOSCOPY (EGD) WITH PROPOFOL N/A 03/03/2017   Procedure: ESOPHAGOGASTRODUODENOSCOPY (EGD) WITH PROPOFOL;  Surgeon: Carman Ching, MD;  Location: [facility] ENDOSCOPY;  Service: Endoscopy;  Laterality: N/A;   ESOPHAGOGASTRODUODENOSCOPY (EGD) WITH PROPOFOL N/A 09/04/2017   Procedure: ESOPHAGOGASTRODUODENOSCOPY (EGD) WITH PROPOFOL;  Surgeon: Kerin Salen, MD;  Location: [facility] ENDOSCOPY;  Service: Gastroenterology;  Laterality: N/A;   ESOPHAGOGASTRODUODENOSCOPY (EGD) WITH PROPOFOL N/A 11/27/2017   Procedure: ESOPHAGOGASTRODUODENOSCOPY (EGD);  Surgeon: Kerin Salen, MD;  Location: [facility]COPY;  Service: Gastroenterology;  Laterality: N/A;   ESOPHAGOGASTRODUODENOSCOPY (EGD) WITH PROPOFOL N/A 08/16/2019   Procedure: ESOPHAGOGASTRODUODENOSCOPY (EGD) WITH PROPOFOL;  Surgeon: Kerin Salen, MD;  Location: [facility] ENDOSCOPY;  Service: Gastroenterology;  Laterality: N/A;   ESOPHAGOGASTRODUODENOSCOPY (EGD) WITH PROPOFOL N/A 08/21/2020   Procedure: ESOPHAGOGASTRODUODENOSCOPY (EGD) WITH PROPOFOL;  Surgeon: Kerin Salen, MD;  Location: [facility] ENDOSCOPY;  Service: Gastroenterology;  Laterality: N/A;   ESOPHAGOGASTRODUODENOSCOPY (EGD) WITH PROPOFOL  N/A 08/24/2021   Procedure: ESOPHAGOGASTRODUODENOSCOPY (EGD) WITH PROPOFOL;  Surgeon: Kerin Salen, MD;  Location: [facility] ENDOSCOPY;  Service: Gastroenterology;  Laterality: N/A;   ESOPHAGOGASTRODUODENOSCOPY (EGD) WITH PROPOFOL N/A 06/25/2023   Procedure: ESOPHAGOGASTRODUODENOSCOPY (EGD) WITH PROPOFOL;  Surgeon: Vida Rigger, MD;  Location: [facility] ENDOSCOPY;  Service: Gastroenterology;  Laterality: N/A;  W. bandging    ESOPHAGOGASTRODUODENOSCOPY (EGD) WITH PROPOFOL N/A 10/21/2023   Procedure: ESOPHAGOGASTRODUODENOSCOPY (EGD) WITH PROPOFOL;  Surgeon: Kerin Salen, MD;  Location: [facility] ENDOSCOPY;  Service: Gastroenterology;  Laterality: N/A;  with banding and APC   GASTRIC VARICES BANDING N/A 08/24/2021   Procedure: GASTRIC VARICES BANDING;  Surgeon: Kerin Salen, MD;  Location: [facility] ENDOSCOPY;  Service: Gastroenterology;  Laterality: N/A;   GASTRIC VARICES BANDING N/A 08/13/2022   Procedure: GASTRIC VARICES BANDING;  Surgeon: Kerin Salen, MD;  Location: [facility] ENDOSCOPY;  Service: Gastroenterology;  Laterality: N/A;   HEMOSTASIS CLIP PLACEMENT  08/21/2020   Procedure: HEMOSTASIS CLIP PLACEMENT;  Surgeon: Kerin Salen, MD;  Location: [facility] ENDOSCOPY;  Service: Gastroenterology;;   HOT HEMOSTASIS N/A 10/21/2023   Procedure: HOT HEMOSTASIS (ARGON PLASMA COAGULATION/BICAP);  Surgeon: Kerin Salen, MD;  Location: [facility] ENDOSCOPY;  Service: Gastroenterology;  Laterality: N/A;   INTERCOSTAL NERVE BLOCK Left 07/01/2022   Procedure: INTERCOSTAL NERVE BLOCK;  Surgeon: Loreli Slot, MD;  Location: [facility] OR;  Service: Thoracic;  Laterality: Left;   IR GENERIC HISTORICAL  05/15/2016   IR RADIOLOGIST EVAL & MGMT 05/15/2016 Gilmer Mor, DO [facility] INTERV RAD   IR GENERIC HISTORICAL  01/14/2017   IR US GUIDE VASC ACCESS RIGHT 01/14/2017 Berdine Dance, MD [facility]   IR GENERIC HISTORICAL  01/14/2017   IR VENOGRAM HEPATIC W HEMODYNAMIC EVALUATION 01/14/2017 Berdine Dance, MD [facility]   IR RADIOLOGIST EVAL & MGMT  06/12/2017   IR RADIOLOGIST EVAL & MGMT  08/11/2018   IR RADIOLOGIST EVAL & MGMT  08/25/2019   IR RADIOLOGIST EVAL & MGMT  08/22/2020   IR RADIOLOGIST EVAL & MGMT  03/11/2022   MASTECTOMY Right 06/02/12   right breast with lymph node removal   NODE DISSECTION Left 07/01/2022   Procedure: NODE DISSECTION;  Surgeon: Loreli Slot, MD;  Location: [facility] OR;  Service: Thoracic;  Laterality: Left;   PORT-A-CATH REMOVAL     PORTACATH  PLACEMENT  2011   left side   RADIOFREQUENCY ABLATION  06/25/2023   Procedure: RADIO FREQUENCY ABLATION;  Surgeon: Vida Rigger, MD;  Location: [facility] ENDOSCOPY;  Service: Gastroenterology;;   RADIOLOGY WITH ANESTHESIA N/A 04/13/2016   Procedure: RADIOLOGY WITH ANESTHESIA;  Surgeon: Gilmer Mor, DO;  Location: [facility] OR;  Service: Anesthesiology;  Laterality: N/A;   VIDEO BRONCHOSCOPY WITH ENDOBRONCHIAL NAVIGATION N/A 07/01/2022   Procedure: VIDEO BRONCHOSCOPY WITH ENDOBRONCHIAL NAVIGATION;  Surgeon: Loreli Slot, MD;  Location: [facility] OR;  Service: Thoracic;  Laterality: N/A;   XI ROBOTIC ASSISTED THORACOSCOPY- SEGMENTECTOMY Left 07/01/2022   Procedure: XI ROBOTIC ASSISTED THORACOSCOPY-LEFT LOWER LOBE BASILAR SEGMENTECTOMY;  Surgeon: Loreli Slot, MD;  Location: [facility] OR;  Service: Thoracic;  Laterality: Left;    FAMILY HISTORY Family History  Problem Relation Age of Onset   COPD Mother    AAA (abdominal aortic aneurysm) Mother    COPD Father    Cancer Neg Hx   The patient's father died at the age of 106 from heart disease. Patient's mother died at the age of 69 status post CABG. The patient had no brothers, 2 sisters. There is no history of breast or ovarian cancer in the family.  GYNECOLOGIC HISTORY: Updated May 2018 Menarche age 51, she is GX P0. She does not recall when she went through menopause. She never took hormone replacement.   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 Husband [name] is retired from [employer]. They have no children and no pets. They attend a local [address].    ADVANCED DIRECTIVES: Not in place   HEALTH MAINTENANCE: Social History   Tobacco Use   Smoking status: Never   Smokeless tobacco: Never  Vaping Use   Vaping status: Never Used  Substance Use Topics   Alcohol use: No   Drug use: No     Colonoscopy: Never  PAP: Remote  Bone  density: 09/27/2013, osteoporosis with a T score of -2.8  Lipid panel: Not on file    No Known Allergies   Current Outpatient Medications  Medication Sig Dispense Refill   denosumab (PROLIA) 60 MG/ML SOLN injection Inject 60 mg into the skin every 6 (six) months. Administer in upper arm, thigh, or abdomen     ELIQUIS 5 MG TABS tablet TAKE 1 TABLET BY MOUTH TWICE A DAY 60 tablet 2   ferrous sulfate 325 (65 FE) MG tablet Take 1 tablet (325 mg total) by mouth daily before breakfast. 90 tablet 0   [Paused] fulvestrant (FASLODEX) 250 MG/5ML injection Inject 250-500 mg into the muscle See admin instructions. Loading dose of 500mg  on days 1,15, and 29. THEN 250mg  every 28 days thereafter     furosemide (LASIX) 20 MG tablet Take 20 mg by mouth daily.     Milk Thistle 1000 MG CAPS Take 1,000 mg by mouth daily.     [Paused] palbociclib (IBRANCE) 75 MG tablet Take 1 tablet (75 mg total) by mouth daily. Take for 21 days on, 7 days off, repeat every 28 days. 21 tablet 3   pantoprazole (PROTONIX) 40 MG tablet Take 40 mg by mouth daily.     propranolol (INDERAL) 20 MG tablet Take 1 tablet (20 mg total) by mouth 2 (two) times daily. 60 tablet 0   spironolactone (ALDACTONE) 25 MG tablet Take 25 mg by mouth daily.     venlafaxine XR (EFFEXOR-XR) 150 MG 24 hr capsule Take 1 capsule (150 mg total) by mouth daily with breakfast. 90 capsule 3   No current facility-administered medications for this visit.   Facility-Administered Medications Ordered in Other Visits  Medication Dose Route Frequency Provider Last Rate Last Admin   denosumab (PROLIA) injection 60 mg  60 mg Subcutaneous Once Magrinat, Valentino Hue, MD        OBJECTIVE: white woman who appears younger than stated age  There were no vitals filed for this visit.   Physical Exam Constitutional:      Appearance: Normal appearance. She is not ill-appearing.     Comments:    Cardiovascular:     Rate and Rhythm: Normal rate and regular rhythm.      Pulses: Normal pulses.     Heart sounds: Normal heart sounds.  Pulmonary:     Effort: Pulmonary effort is normal.     Breath sounds: Normal breath sounds.  Abdominal:     General: There is no distension.  Musculoskeletal:        General: No swelling or tenderness. Normal range of motion.     Cervical back: Normal range of motion. No rigidity.  Lymphadenopathy:     Cervical: No  cervical adenopathy.  Skin:    General: Skin is warm and dry.  Neurological:     General: No focal deficit present.     Mental Status: She is alert.    LAB RESULTS:  CMP     Component Value Date/Time   NA 137 03/05/2024 0413   NA 142 11/17/2017 1355   K 4.8 03/05/2024 0413   K 4.4 11/17/2017 1355   CL 106 03/05/2024 0413   CL 109 (H) 04/20/2013 0859   CO2 21 (L) 03/05/2024 0413   CO2 22 11/17/2017 1355   GLUCOSE 114 (H) 03/05/2024 0413   GLUCOSE 86 11/17/2017 1355   GLUCOSE 91 04/20/2013 0859   BUN 19 03/05/2024 0413   BUN 12.5 11/17/2017 1355   CREATININE 1.13 (H) 03/05/2024 0413   CREATININE 1.05 (H) 03/03/2024 1349   CREATININE 1.1 11/17/2017 1355   CALCIUM 8.3 (L) 03/05/2024 0413   CALCIUM 9.2 11/17/2017 1355   PROT 6.3 (L) 03/05/2024 0413   PROT 7.0 11/17/2017 1355   ALBUMIN 3.4 (L) 03/05/2024 0413   ALBUMIN 3.8 11/17/2017 1355   AST 23 03/05/2024 0413   AST 17 03/03/2024 1349   AST 23 11/17/2017 1355   ALT 18 03/05/2024 0413   ALT 13 03/03/2024 1349   ALT 22 11/17/2017 1355   ALKPHOS 60 03/05/2024 0413   ALKPHOS 55 11/17/2017 1355   BILITOT 2.0 (H) 03/05/2024 0413   BILITOT 0.6 03/03/2024 1349   BILITOT 0.49 11/17/2017 1355   GFRNONAA 53 (L) 03/05/2024 0413   GFRNONAA 58 (L) 03/03/2024 1349   GFRNONAA 53 (L) 05/09/2016 1104   GFRAA 53 (L) 06/12/2020 1146   GFRAA 55 (L) 12/13/2019 1200   GFRAA 61 05/09/2016 1104    Lab Results  Component Value Date   TOTALPROTELP 6.0 03/04/2024   ALBUMINELP 3.4 03/04/2024   A1GS 0.4 03/04/2024   A2GS 0.6 03/04/2024   BETS 1.1  03/04/2024   GAMS 0.6 03/04/2024   MSPIKE Not Observed 03/04/2024   SPEI Comment 03/04/2024    Lab Results  Component Value Date   WBC 2.5 (L) 03/05/2024   NEUTROABS 1.7 03/05/2024   HGB 8.8 (L) 03/05/2024   HCT 29.4 (L) 03/05/2024   MCV 91.0 03/05/2024   PLT 52 (L) 03/05/2024    Lab Results  Component Value Date   LABCA2 18 08/21/2012    No components found for: "UUVOZD664"  Recent Labs  Lab 03/04/24 1412  INR 1.7*    Lab Results  Component Value Date   LABCA2 18 08/21/2012    No results found for: "QIH474"  No results found for: "CAN125"  No results found for: "QVZ563"  Lab Results  Component Value Date   CA2729 26.9 12/13/2019    No components found for: "HGQUANT"  No results found for: "CEA1", "CEA" / No results found for: "CEA1", "CEA"   No results found for: "AFPTUMOR"  No results found for: "CHROMOGRNA"  No results found for: "KPAFRELGTCHN", "LAMBDASER", "KAPLAMBRATIO" (kappa/lambda light chains)  No results found for: "HGBA", "HGBA2QUANT", "HGBFQUANT", "HGBSQUAN" (Hemoglobinopathy evaluation)   Lab Results  Component Value Date   LDH 195 08/21/2012    Lab Results  Component Value Date   IRON 236 (H) 03/04/2024   TIBC 634 (H) 03/04/2024   IRONPCTSAT 37 (H) 03/04/2024   (Iron and TIBC)  Lab Results  Component Value Date   FERRITIN 8 (L) 03/04/2024    Urinalysis    Component Value Date/Time   COLORURINE YELLOW 06/28/2022 1122  APPEARANCEUR CLEAR 06/28/2022 1122   LABSPEC 1.015 06/28/2022 1122   LABSPEC 1.010 04/29/2016 1118   PHURINE 6.0 06/28/2022 1122   GLUCOSEU NEGATIVE 06/28/2022 1122   GLUCOSEU Negative 04/29/2016 1118   HGBUR NEGATIVE 06/28/2022 1122   BILIRUBINUR NEGATIVE 06/28/2022 1122   BILIRUBINUR Negative 04/29/2016 1118   KETONESUR NEGATIVE 06/28/2022 1122   PROTEINUR NEGATIVE 06/28/2022 1122   UROBILINOGEN 0.2 04/29/2016 1118   NITRITE NEGATIVE 06/28/2022 1122   LEUKOCYTESUR NEGATIVE 06/28/2022 1122    LEUKOCYTESUR Trace 04/29/2016 1118   CBC from today shows significant improvement in hemoglobin.  White blood cell count is 3100, total neutrophil count is 2500, no thrombocytopenia  STUDIES: DG Chest 2 View Result Date: 03/03/2024 CLINICAL DATA:  History of breast cancer presenting with low hemoglobin and weakness. EXAM: CHEST - 2 VIEW COMPARISON:  October 01, 2022 FINDINGS: The heart size and mediastinal contours are within normal limits. Ill-defined surgical sutures are seen within the perihilar region on the left. Very mild areas of scarring and/or atelectasis are seen within the right lung base and in the mid to upper lung fields, bilaterally. There is a small right pleural effusion. A stable small to moderate size left pleural effusion is also seen. No pneumothorax is identified. Radiopaque surgical clips are seen along the right axilla. Radiopaque vascular coils are seen within the medial aspect of the left upper quadrant. No acute osseous abnormalities are identified. IMPRESSION: 1. Very mild right basilar and bilateral mid to upper lung field scarring and/or atelectasis. 2. Small right pleural effusion. 3. Stable small to moderate size left pleural effusion. Electronically Signed   By: Aram Candela M.D.   On: 03/03/2024 20:24    COMPARISON: Abdominal ultrasound November 05, 2021   TECHNIQUE:  Real time limited abdomen ultrasound was performed.   IMAGE QUALITY: Satisfactory, of diagnostic value.   FINDINGS:   Limited sonographic views of the abdomen demonstrates mild abdominal ascites. There is no large pocket to safely drain.    IMPRESSION:  IMPRESSION:  1. Mild abdominal ascites.  2. No large enough pocket to safely drain.    ASSESSMENT: 68 y.o.  woman presenting June 2011 with stage IV breast cancer involving the right breast upper outer quadrant, right axilla, mediastinal lymph nodes, and both lungs, but not the brain, liver, or bones  (1) positive right breast and  right axillary lymph node biopsies 07/17/2010 of a clinical T4 N2 M1 invasive ductal carcinoma, grade 1, estrogen receptor 100% positive, progesterone receptor 6% positive, with an MIB-1 of 12%, and no HER-2 amplification.  (2) participated in Phase II tessetaxel study, receiving 2 cycles complicated by thrombocytopenia, anemia requiring transfusion, transaminase elevation, and afebrile neutropenia. Off-study as of September 2011. Chest CT scan September 2011 did show evidence of response.  (3) on letrozole as of October 2011, with continuing response and good tolerance   (4) 06/02/2012 underwent right mastectomy and axillary lymph node sampling (3 lymph nodes removed, all with viable cancer as well as evidence of treatment effect) for a ypT2 ypN1-2 invasive ductal carcinoma, grade 1, 98% estrogen receptor positive, progesterone receptor negative, with no HER-2 amplification  (5) left jugular vein DVT documented 06/16/2012; left sided Port-A-Cath removed mid July 2015; Coumadin stopped 09/07/2014.  (a) Left brachiocephalic v. slightly narrowed, with some Left lower neck collateralization noted on chest CT December 2015  (6) osteoporosis, was on alendronate, switched to denosumab/Prolia 11/07/2016  (a) bone density in 10/2018 shows osteopenia T score of -2.2  (7) portal hypertension, with splenomegaly,  gastric varices, and intestinal edema  (a)  mild persistent leukopenia and thrombocytopenia likely due to splenomegaly  (b) iron deficiency secondary to above, status post Feraheme x2 December 2018  (8) restaging studies:  (a) chest CT 12/26/2017 shows a 4.5 mm left lower lobe pulmonary nodule which is unchanged.  There was no other evidence of active disease  (b) restaging chest CT in 05/2019 shows no change in bilateral pulmonary nodules, prominent mediastinal lymph nodes are stable  (9) CT scan of the abdomen 11/03/2021 shows thrombus in the portal, splenic and superior mesenteric vein  (A)  Heparin started 11/03/2021  10. PET scan 05/24/2022, low metabolic activity of right lower pulmonary nodule. No change in size of CT 02/27/2022, Carcinoma with mucinous features consistent with metastatic breast carcinoma, negative margins, no LN involvement.  Prognostics showed ER +100% strong staining, PR +20% staining, Ki-67 of 10% and HER2 2+ by IHC and negative by FISH  PLAN:    Total time: 30 min *Total Encounter Time as defined by the Centers for Medicare and Medicaid Services includes, in addition to the face-to-face time of a patient visit (documented in the note above) non-face-to-face time: obtaining and reviewing outside history, ordering and reviewing medications, tests or procedures, care coordination (communications with other health care professionals or caregivers) and documentation in the medical record.

## 2024-03-10 ENCOUNTER — Inpatient Hospital Stay (HOSPITAL_BASED_OUTPATIENT_CLINIC_OR_DEPARTMENT_OTHER): Admitting: Hematology and Oncology

## 2024-03-10 ENCOUNTER — Inpatient Hospital Stay

## 2024-03-10 VITALS — BP 139/75 | HR 90 | Temp 97.7°F | Resp 18 | Ht 64.0 in | Wt 119.8 lb

## 2024-03-10 DIAGNOSIS — K7460 Unspecified cirrhosis of liver: Secondary | ICD-10-CM | POA: Diagnosis not present

## 2024-03-10 DIAGNOSIS — C7801 Secondary malignant neoplasm of right lung: Secondary | ICD-10-CM | POA: Diagnosis not present

## 2024-03-10 DIAGNOSIS — D500 Iron deficiency anemia secondary to blood loss (chronic): Secondary | ICD-10-CM

## 2024-03-10 DIAGNOSIS — D696 Thrombocytopenia, unspecified: Secondary | ICD-10-CM | POA: Diagnosis not present

## 2024-03-10 DIAGNOSIS — D509 Iron deficiency anemia, unspecified: Secondary | ICD-10-CM | POA: Diagnosis not present

## 2024-03-10 DIAGNOSIS — M8580 Other specified disorders of bone density and structure, unspecified site: Secondary | ICD-10-CM | POA: Diagnosis not present

## 2024-03-10 DIAGNOSIS — D72819 Decreased white blood cell count, unspecified: Secondary | ICD-10-CM | POA: Diagnosis not present

## 2024-03-10 DIAGNOSIS — Z5111 Encounter for antineoplastic chemotherapy: Secondary | ICD-10-CM | POA: Diagnosis present

## 2024-03-10 DIAGNOSIS — K922 Gastrointestinal hemorrhage, unspecified: Secondary | ICD-10-CM

## 2024-03-10 DIAGNOSIS — K766 Portal hypertension: Secondary | ICD-10-CM | POA: Diagnosis not present

## 2024-03-10 DIAGNOSIS — R161 Splenomegaly, not elsewhere classified: Secondary | ICD-10-CM | POA: Diagnosis not present

## 2024-03-10 DIAGNOSIS — C50411 Malignant neoplasm of upper-outer quadrant of right female breast: Secondary | ICD-10-CM | POA: Diagnosis not present

## 2024-03-10 DIAGNOSIS — D649 Anemia, unspecified: Secondary | ICD-10-CM

## 2024-03-10 DIAGNOSIS — Z1732 Human epidermal growth factor receptor 2 negative status: Secondary | ICD-10-CM | POA: Diagnosis not present

## 2024-03-10 DIAGNOSIS — C773 Secondary and unspecified malignant neoplasm of axilla and upper limb lymph nodes: Secondary | ICD-10-CM | POA: Diagnosis not present

## 2024-03-10 DIAGNOSIS — Z170 Estrogen receptor positive status [ER+]: Secondary | ICD-10-CM | POA: Diagnosis not present

## 2024-03-10 DIAGNOSIS — C799 Secondary malignant neoplasm of unspecified site: Secondary | ICD-10-CM

## 2024-03-10 DIAGNOSIS — Z86718 Personal history of other venous thrombosis and embolism: Secondary | ICD-10-CM | POA: Diagnosis not present

## 2024-03-10 DIAGNOSIS — C7802 Secondary malignant neoplasm of left lung: Secondary | ICD-10-CM | POA: Diagnosis not present

## 2024-03-10 DIAGNOSIS — Z1721 Progesterone receptor positive status: Secondary | ICD-10-CM | POA: Diagnosis not present

## 2024-03-10 LAB — CBC WITH DIFFERENTIAL (CANCER CENTER ONLY)
Abs Immature Granulocytes: 0.04 10*3/uL (ref 0.00–0.07)
Basophils Absolute: 0.1 10*3/uL (ref 0.0–0.1)
Basophils Relative: 2 %
Eosinophils Absolute: 0.1 10*3/uL (ref 0.0–0.5)
Eosinophils Relative: 3 %
HCT: 30.8 % — ABNORMAL LOW (ref 36.0–46.0)
Hemoglobin: 9.1 g/dL — ABNORMAL LOW (ref 12.0–15.0)
Immature Granulocytes: 1 %
Lymphocytes Relative: 9 %
Lymphs Abs: 0.4 10*3/uL — ABNORMAL LOW (ref 0.7–4.0)
MCH: 28.2 pg (ref 26.0–34.0)
MCHC: 29.5 g/dL — ABNORMAL LOW (ref 30.0–36.0)
MCV: 95.4 fL (ref 80.0–100.0)
Monocytes Absolute: 0.5 10*3/uL (ref 0.1–1.0)
Monocytes Relative: 13 %
Neutro Abs: 2.9 10*3/uL (ref 1.7–7.7)
Neutrophils Relative %: 72 %
Platelet Count: 91 10*3/uL — ABNORMAL LOW (ref 150–400)
RBC: 3.23 MIL/uL — ABNORMAL LOW (ref 3.87–5.11)
RDW: 23.2 % — ABNORMAL HIGH (ref 11.5–15.5)
WBC Count: 4.1 10*3/uL (ref 4.0–10.5)
nRBC: 0 % (ref 0.0–0.2)

## 2024-03-10 LAB — CMP (CANCER CENTER ONLY)
ALT: 19 U/L (ref 0–44)
AST: 23 U/L (ref 15–41)
Albumin: 3.9 g/dL (ref 3.5–5.0)
Alkaline Phosphatase: 66 U/L (ref 38–126)
Anion gap: 8 (ref 5–15)
BUN: 19 mg/dL (ref 8–23)
CO2: 25 mmol/L (ref 22–32)
Calcium: 8.9 mg/dL (ref 8.9–10.3)
Chloride: 106 mmol/L (ref 98–111)
Creatinine: 1.16 mg/dL — ABNORMAL HIGH (ref 0.44–1.00)
GFR, Estimated: 51 mL/min — ABNORMAL LOW (ref >60)
Glucose, Bld: 98 mg/dL (ref 70–99)
Potassium: 3.7 mmol/L (ref 3.5–5.1)
Sodium: 139 mmol/L (ref 135–145)
Total Bilirubin: 1.3 mg/dL — ABNORMAL HIGH (ref 0.0–1.2)
Total Protein: 6.4 g/dL — ABNORMAL LOW (ref 6.5–8.1)

## 2024-03-10 LAB — SAMPLE TO BLOOD BANK

## 2024-03-18 ENCOUNTER — Inpatient Hospital Stay

## 2024-03-18 VITALS — BP 114/63 | HR 73 | Temp 98.0°F | Resp 16

## 2024-03-18 DIAGNOSIS — M810 Age-related osteoporosis without current pathological fracture: Secondary | ICD-10-CM

## 2024-03-18 DIAGNOSIS — Z5111 Encounter for antineoplastic chemotherapy: Secondary | ICD-10-CM | POA: Diagnosis not present

## 2024-03-18 MED ORDER — SODIUM CHLORIDE 0.9 % IV SOLN
300.0000 mg | Freq: Once | INTRAVENOUS | Status: AC
  Administered 2024-03-18: 300 mg via INTRAVENOUS
  Filled 2024-03-18: qty 300

## 2024-03-18 MED ORDER — SODIUM CHLORIDE 0.9 % IV SOLN
Freq: Once | INTRAVENOUS | Status: AC
Start: 2024-03-18 — End: 2024-03-18

## 2024-03-18 MED ORDER — FULVESTRANT 250 MG/5ML IM SOSY
500.0000 mg | PREFILLED_SYRINGE | Freq: Once | INTRAMUSCULAR | Status: AC
  Administered 2024-03-18: 500 mg via INTRAMUSCULAR
  Filled 2024-03-18: qty 10

## 2024-03-18 NOTE — Patient Instructions (Signed)

## 2024-03-18 NOTE — Progress Notes (Signed)
Patient declined to stay for 30 minute post-observation period following Venofer infusion.  Patient's vital signs retaken prior to discharge and remained within normal parameters.  Patient discharged in stable condition.

## 2024-03-22 ENCOUNTER — Ambulatory Visit (HOSPITAL_COMMUNITY)
Admission: RE | Admit: 2024-03-22 | Discharge: 2024-03-22 | Disposition: A | Discharge status: Home / Self Care | Source: Ambulatory Visit | Attending: Hematology and Oncology | Admitting: Hematology and Oncology

## 2024-03-22 DIAGNOSIS — J439 Emphysema, unspecified: Secondary | ICD-10-CM | POA: Diagnosis not present

## 2024-03-22 DIAGNOSIS — D500 Iron deficiency anemia secondary to blood loss (chronic): Secondary | ICD-10-CM | POA: Diagnosis present

## 2024-03-22 DIAGNOSIS — C50911 Malignant neoplasm of unspecified site of right female breast: Secondary | ICD-10-CM | POA: Insufficient documentation

## 2024-03-22 DIAGNOSIS — Z9011 Acquired absence of right breast and nipple: Secondary | ICD-10-CM | POA: Insufficient documentation

## 2024-03-22 DIAGNOSIS — M899 Disorder of bone, unspecified: Secondary | ICD-10-CM | POA: Insufficient documentation

## 2024-03-22 DIAGNOSIS — I700 Atherosclerosis of aorta: Secondary | ICD-10-CM | POA: Insufficient documentation

## 2024-03-22 DIAGNOSIS — K7460 Unspecified cirrhosis of liver: Secondary | ICD-10-CM | POA: Diagnosis not present

## 2024-03-22 DIAGNOSIS — C799 Secondary malignant neoplasm of unspecified site: Secondary | ICD-10-CM | POA: Diagnosis present

## 2024-03-22 DIAGNOSIS — J90 Pleural effusion, not elsewhere classified: Secondary | ICD-10-CM | POA: Insufficient documentation

## 2024-03-22 DIAGNOSIS — R918 Other nonspecific abnormal finding of lung field: Secondary | ICD-10-CM | POA: Insufficient documentation

## 2024-03-22 LAB — GLUCOSE, CAPILLARY: Glucose-Capillary: 111 mg/dL — ABNORMAL HIGH (ref 70–99)

## 2024-03-22 MED ORDER — FLUDEOXYGLUCOSE F - 18 (FDG) INJECTION
5.9000 | Freq: Once | INTRAVENOUS | Status: AC
  Administered 2024-03-22: 5.9 via INTRAVENOUS

## 2024-03-25 ENCOUNTER — Ambulatory Visit: Payer: Medicare Other

## 2024-03-25 ENCOUNTER — Ambulatory Visit: Payer: Medicare Other | Admitting: Hematology and Oncology

## 2024-03-25 ENCOUNTER — Other Ambulatory Visit: Payer: Medicare Other

## 2024-03-26 ENCOUNTER — Other Ambulatory Visit: Payer: Self-pay | Admitting: Hematology and Oncology

## 2024-03-26 ENCOUNTER — Other Ambulatory Visit: Payer: Self-pay | Admitting: *Deleted

## 2024-03-26 ENCOUNTER — Inpatient Hospital Stay

## 2024-03-26 VITALS — BP 126/73 | HR 71 | Temp 97.6°F | Resp 16

## 2024-03-26 DIAGNOSIS — M810 Age-related osteoporosis without current pathological fracture: Secondary | ICD-10-CM

## 2024-03-26 DIAGNOSIS — Z5111 Encounter for antineoplastic chemotherapy: Secondary | ICD-10-CM | POA: Diagnosis not present

## 2024-03-26 MED ORDER — ALTEPLASE 2 MG IJ SOLR: 2.0000 mg | Freq: Once | INTRAMUSCULAR | Status: DC | PRN

## 2024-03-26 MED ORDER — SODIUM CHLORIDE 0.9% FLUSH: 10.0000 mL | Freq: Once | INTRAVENOUS | Status: DC | PRN

## 2024-03-26 MED ORDER — SODIUM CHLORIDE 0.9 % IV SOLN: INTRAVENOUS | Status: DC | PRN

## 2024-03-26 MED ORDER — HEPARIN SOD (PORK) LOCK FLUSH 100 UNIT/ML IV SOLN: 500.0000 [IU] | Freq: Once | INTRAVENOUS | Status: DC | PRN

## 2024-03-26 MED ORDER — SODIUM CHLORIDE 0.9% FLUSH: 3.0000 mL | Freq: Once | INTRAVENOUS | Status: DC | PRN

## 2024-03-26 MED ORDER — SODIUM CHLORIDE 0.9 % IV SOLN
300.0000 mg | Freq: Once | INTRAVENOUS | Status: AC
  Administered 2024-03-26: 300.0065 mg via INTRAVENOUS
  Filled 2024-03-26: qty 300

## 2024-03-26 MED ORDER — HEPARIN SOD (PORK) LOCK FLUSH 100 UNIT/ML IV SOLN
250.0000 [IU] | Freq: Once | INTRAVENOUS | Status: DC | PRN
Start: 2024-03-26 — End: 2024-03-26

## 2024-03-26 NOTE — Progress Notes (Signed)
Patient declined post iron infusion observation.  Tolerated treatment well without incident.  VSS at discharge.  Wheelchair to lobby.

## 2024-03-26 NOTE — Patient Instructions (Signed)

## 2024-03-26 NOTE — Progress Notes (Signed)
 [facility]  Telephone:([phone]) [phone] Fax:([phone]) [phone]    ID: Nicole [name]   DOB: 02-19-1956  MR#: [id number]  GUY#:[id number]  Patient Care Team: [facility] [place] as PCP - General (Family Medicine) Annamarie Major, CRNP as Nurse Practitioner (Nurse Practitioner) [name], MD as Consulting Physician (General Surgery) Kerin Salen, MD as Consulting Physician (Gastroenterology)  CHIEF COMPLAINT:  Metastatic Breast Cancer (s/p right mastectomy)  CURRENT TREATMENT: Ibrance and Faslodex, prolia every 6 months.  INTERVAL HISTORY:  Discussed the use of AI scribe software for clinical note transcription with the patient, who gave verbal consent to proceed.  History of Present Illness    The patient, with a history of breast cancer, cirrhosis, and portal vein thrombosis, is here for hospital follow up. She was recently admitted with severe anemia, required PRBC transfusion. Severe iron deficiency noted. Last PET imaging with no overt evidence of metastatic disease.  She has a history of iron deficiency anemia and previously received iron infusions, specifically Venofer, in 2018. Her current hemoglobin level is 9.1, which she considers an improvement. She is currently holding her Ibrance medication, which is associated with low blood counts, although it does not cause iron deficiency.  She experiences significant fatigue and shortness of breath upon exertion, such as when walking or attempting to shower. Despite feeling better now, she still experiences shortness of breath and a slight cough, which she suspects might be due to allergies. Her fatigue was severe to the point where she struggles to walk short distances within her home before her hospitalization. She is on diuretics, which necessitate frequent urination, further impacting her energy levels.  She underwent a stool test for occult blood, which was negative, and she has not observed any  blood in her stool. She recalls having a colonoscopy performed by Dr. Randa Evens before his retirement, with no need for another for ten years. She also had an upper endoscopy in August, which revealed varices but no other significant findings.  She inquires about dietary sources of iron, mentioning that she consumes spinach, dates, and some red meat. She is concerned about the potential for inadequate iron absorption from her diet.   COVID 19 VACCINATION STATUS: Status post Pfizer x2+ booster August 2021   BREAST CANCER HISTORY: From Dr. Theron Arista Rubin's 07/25/2010 note:  "This woman has not had any significant medical intervention for some time.  Her last mammogram was about seven years ago.  She noted a right breast mass about a year ago and did not seek immediate medical attention for this.  She ultimately, after some time, admitted this to her husband and self-referred herself for intervention.  She had a mammogram on 07/17/2010 with an ultrasound of the right breast.  This showed a lobulated mass upper right outer quadrant measuring at least 6.3 x 7.3 cm and large right axillary lymph nodes were also noted.  There was skin thickening overlying the mass.  On physical exam, this mass was about an 8 cm with skin dimpling noted. Discolored area in the skin over the mass, fullness in the axilla.  Biopsy was recommended, which took place on 07/17/2010.  Pathology showed invasive ductal cancer involving both lymph node and breast.  The HER-2 was not amplified.  ER and PR both positive at 100% and 6% respectively.  Proliferative index was 12% involving both the lymph node and breast mass.  An MRI scan of both breasts was performed on 07/22/2010, essentially which showed a large heterogeneously enhancing mass  of the right breast with washout kinetics measuring 6.4 x 4.4 x 5.0 cm.  Numerous satellite nodules were seen throughout the dominant mass.  There were several suspicious nodules in the upper central and upper  inner quadrant of the right breast.  There were two enhancing subcentimeter nodules seen in the right pectoralis muscle.  No obvious chest wall nodules were seen; however, there was seen some metastatic adenopathy in the mediastinum as well as hila with a 2.4 x 2.0 cm mediastinal lymph node as well as 1.2 x 3.0 cm subcarinal lymph node.  There was a right hilar and probable left hilar adenopathy.  Bilateral pulmonary nodules were seen and a right T2 lesion was seen anterior right liver as well."  Her subsequent history is as detailed below.   PAST MEDICAL HISTORY: Past Medical History:  Diagnosis Date   Anemia    low iron   Arthritis    fingers   Blood transfusion 2012   Breast CA (HCC)    breast ca dx 06/2010, stage 4, right , Lung Metasis.Surgery, Chemo, Radiation   Breast cancer (HCC)    Clot    jugular  ?2015   Depression    Esophageal varices (HCC)    Headache    Mirgraine- rare since menopause   Heart murmur    mild, no cardiologist, from birth   Hematemesis 08/2017   History of kidney stones    no pain- shows in CT- small 1 mm bilaterally- kidney   Hypertension    IBD (inflammatory bowel disease)    resolved   Idiopathic cirrhosis (HCC)    pt denies   Peripheral vascular disease (HCC) few yrs ago   clot in right jugular   Portal hypertension (HCC)    Radiation 07/21/12-09/07/12   5940 cGy   Restless legs     PAST SURGICAL HISTORY: Past Surgical History:  Procedure Laterality Date   BIOPSY  08/21/2020   Procedure: BIOPSY;  Surgeon: Kerin Salen, MD;  Location: [facility] ENDOSCOPY;  Service: Gastroenterology;;   COLONOSCOPY  2016   DILATION AND CURETTAGE OF UTERUS  2004?   ESOPHAGEAL BANDING  09/04/2017   Procedure: ESOPHAGEAL BANDING;  Surgeon: Kerin Salen, MD;  Location: [facility] ENDOSCOPY;  Service: Gastroenterology;;   ESOPHAGEAL BANDING  11/27/2017   Procedure: ESOPHAGEAL BANDING;  Surgeon: Kerin Salen, MD;  Location: [facility] ENDOSCOPY;  Service: Gastroenterology;;   ESOPHAGEAL  BANDING N/A 04/28/2018   Procedure: ESOPHAGEAL BANDING;  Surgeon: Kerin Salen, MD;  Location: [facility] ENDOSCOPY;  Service: Gastroenterology;  Laterality: N/A;   ESOPHAGEAL BANDING  11/17/2018   Procedure: ESOPHAGEAL BANDING;  Surgeon: Kerin Salen, MD;  Location: [facility] ENDOSCOPY;  Service: Gastroenterology;;   ESOPHAGEAL BANDING  08/16/2019   Procedure: ESOPHAGEAL BANDING;  Surgeon: Kerin Salen, MD;  Location: [facility] ENDOSCOPY;  Service: Gastroenterology;;   ESOPHAGEAL BANDING N/A 08/21/2020   Procedure: ESOPHAGEAL BANDING;  Surgeon: Kerin Salen, MD;  Location: [facility];  Service: Gastroenterology;  Laterality: N/A;   ESOPHAGEAL BANDING  10/21/2023   Procedure: ESOPHAGEAL BANDING;  Surgeon: Kerin Salen, MD;  Location: [facility];  Service: Gastroenterology;;   ESOPHAGOGASTRODUODENOSCOPY N/A 04/28/2018   Procedure: ESOPHAGOGASTRODUODENOSCOPY (EGD);  Surgeon: Kerin Salen, MD;  Location: [facility] ENDOSCOPY;  Service: Gastroenterology;  Laterality: N/A;   ESOPHAGOGASTRODUODENOSCOPY N/A 11/17/2018   Procedure: ESOPHAGOGASTRODUODENOSCOPY (EGD);  Surgeon: Kerin Salen, MD;  Location: [facility] ENDOSCOPY;  Service: Gastroenterology;  Laterality: N/A;   ESOPHAGOGASTRODUODENOSCOPY N/A 08/13/2022   Procedure: ESOPHAGOGASTRODUODENOSCOPY (EGD);  Surgeon: Kerin Salen, MD;  Location: [facility] ENDOSCOPY;  Service:  Gastroenterology;  Laterality: N/A;   ESOPHAGOGASTRODUODENOSCOPY (EGD) WITH PROPOFOL N/A 04/11/2016   Procedure: ESOPHAGOGASTRODUODENOSCOPY (EGD) WITH PROPOFOL;  Surgeon: Vida Rigger, MD;  Location: [facility] ENDOSCOPY;  Service: Endoscopy;  Laterality: N/A;   ESOPHAGOGASTRODUODENOSCOPY (EGD) WITH PROPOFOL N/A 07/08/2016   Procedure: ESOPHAGOGASTRODUODENOSCOPY (EGD) WITH PROPOFOL;  Surgeon: Carman Ching, MD;  Location: [facility] ENDOSCOPY;  Service: Endoscopy;  Laterality: N/A;   ESOPHAGOGASTRODUODENOSCOPY (EGD) WITH PROPOFOL N/A 03/03/2017   Procedure: ESOPHAGOGASTRODUODENOSCOPY (EGD) WITH PROPOFOL;  Surgeon: Carman Ching, MD;  Location: [facility]  ENDOSCOPY;  Service: Endoscopy;  Laterality: N/A;   ESOPHAGOGASTRODUODENOSCOPY (EGD) WITH PROPOFOL N/A 09/04/2017   Procedure: ESOPHAGOGASTRODUODENOSCOPY (EGD) WITH PROPOFOL;  Surgeon: Kerin Salen, MD;  Location: [facility] ENDOSCOPY;  Service: Gastroenterology;  Laterality: N/A;   ESOPHAGOGASTRODUODENOSCOPY (EGD) WITH PROPOFOL N/A 11/27/2017   Procedure: ESOPHAGOGASTRODUODENOSCOPY (EGD);  Surgeon: Kerin Salen, MD;  Location: [facility] ENDOSCOPY;  Service: Gastroenterology;  Laterality: N/A;   ESOPHAGOGASTRODUODENOSCOPY (EGD) WITH PROPOFOL N/A 08/16/2019   Procedure: ESOPHAGOGASTRODUODENOSCOPY (EGD) WITH PROPOFOL;  Surgeon: Kerin Salen, MD;  Location: [facility];  Service: Gastroenterology;  Laterality: N/A;   ESOPHAGOGASTRODUODENOSCOPY (EGD) WITH PROPOFOL N/A 08/21/2020   Procedure: ESOPHAGOGASTRODUODENOSCOPY (EGD) WITH PROPOFOL;  Surgeon: Kerin Salen, MD;  Location: [facility] END[facility];  Service: Gastroenterology;  Laterality: N/A;   ESOPHAGOGASTRODUODENOSCOPY (EGD) WITH PROPOFOL N/A 08/24/2021   Procedure: ESOPHAGOGASTRODUODENOSCOPY (EGD) WITH PROPOFOL;  Surgeon: Kerin Salen, MD;  Location: [facility] ENDOS[facility];  Service: Gastroenterology;  Laterality: N/A;   ESOPHAGOGASTRODUODENOSCOPY (EGD) WITH PROPOFOL N/A 06/25/2023   Procedure: ESOPHAGOGASTRODUODENOSCOPY (EGD) WITH PROPOFOL;  Surgeon: Vida Rigger, MD;  Location: [facility] ENDOSCOPY;  Service: Gastroenterology;  Laterality: N/A;  W. bandging   ESOPHAGOGASTRODUODENOSCOPY (EGD) WITH PROPOFOL N/A 10/21/2023   Procedure: ESOPHAGOGASTRODUODENOSCOPY (EGD) WITH PROPOFOL;  Surgeon: Kerin Salen, MD;  Location: [facility] ENDOSCOPY;  Service: Gastroenterology;  Laterality: N/A;  with banding and APC   GASTRIC VARICES BANDING N/A 08/24/2021   Procedure: GASTRIC VARICES BANDING;  Surgeon: Kerin Salen, MD;  Location: [facility] ENDOSCOPY;  Service: Gastroenterology;  Laterality: N/A;   GASTRIC VARICES BANDING N/A 08/13/2022   Procedure: GASTRIC VARICES BANDING;  Surgeon: Kerin Salen, MD;  Location: [facility] ENDOSCOPY;   Service: Gastroenterology;  Laterality: N/A;   HEMOSTASIS CLIP PLACEMENT  08/21/2020   Procedure: HEMOSTASIS CLIP PLACEMENT;  Surgeon: Kerin Salen, MD;  Location: [facility] ENDOSCOPY;  Service: Gastroenterology;;   HOT HEMOSTASIS N/A 10/21/2023   Procedure: HOT HEMOSTASIS (ARGON PLASMA COAGULATION/BICAP);  Surgeon: Kerin Salen, MD;  Location: [facility] ENDOSCOPY;  Service: Gastroenterology;  Laterality: N/A;   INTERCOSTAL NERVE BLOCK Left 07/01/2022   Procedure: INTERCOSTAL NERVE BLOCK;  Surgeon: Loreli Slot, MD;  Location: [facility] OR;  Service: Thoracic;  Laterality: Left;   IR GENERIC HISTORICAL  05/15/2016   IR RADIOLOGIST EVAL & MGMT 05/15/2016 Gilmer Mor, DO [facility] INTERV RAD   IR GENERIC HISTORICAL  01/14/2017   IR US GUIDE VASC ACCESS RIGHT 01/14/2017 Berdine Dance, MD [facility]   IR GENERIC HISTORICAL  01/14/2017   IR VENOGRAM HEPATIC W HEMODYNAMIC EVALUATION 01/14/2017 Berdine Dance, MD [facility]   IR RADIOLOGIST EVAL & MGMT  06/12/2017   IR RADIOLOGIST EVAL & MGMT  08/11/2018   IR RADIOLOGIST EVAL & MGMT  08/25/2019   IR RADIOLOGIST EVAL & MGMT  08/22/2020   IR RADIOLOGIST EVAL & MGMT  03/11/2022   MASTECTOMY Right 06/02/12   right breast with lymph node removal   NODE DISSECTION Left 07/01/2022   Procedure: NODE DISSECTION;  Surgeon: Loreli Slot, MD;  Location: [facility] OR;  Service: Thoracic;  Laterality: Left;  PORT-A-CATH REMOVAL     PORTACATH PLACEMENT  2011   left side   RADIOFREQUENCY ABLATION  06/25/2023   Procedure: RADIO FREQUENCY ABLATION;  Surgeon: Vida Rigger, MD;  Location: [facility] ENDOSCOPY;  Service: Gastroenterology;;   RADIOLOGY WITH ANESTHESIA N/A 04/13/2016   Procedure: RADIOLOGY WITH ANESTHESIA;  Surgeon: Gilmer Mor, DO;  Location: [facility] OR;  Service: Anesthesiology;  Laterality: N/A;   VIDEO BRONCHOSCOPY WITH ENDOBRONCHIAL NAVIGATION N/A 07/01/2022   Procedure: VIDEO BRONCHOSCOPY WITH ENDOBRONCHIAL NAVIGATION;  Surgeon: Loreli Slot, MD;  Location: [facility] OR;  Service:  Thoracic;  Laterality: N/A;   XI ROBOTIC ASSISTED THORACOSCOPY- SEGMENTECTOMY Left 07/01/2022   Procedure: XI ROBOTIC ASSISTED THORACOSCOPY-LEFT LOWER LOBE BASILAR SEGMENTECTOMY;  Surgeon: Loreli Slot, MD;  Location: [facility] OR;  Service: Thoracic;  Laterality: Left;    FAMILY HISTORY Family History  Problem Relation Age of Onset   COPD Mother    AAA (abdominal aortic aneurysm) Mother    COPD Father    Cancer Neg Hx   The patient's father died at the age of 35 from heart disease. Patient's mother died at the age of 29 status post CABG. The patient had no brothers, 2 sisters. There is no history of breast or ovarian cancer in the family.   GYNECOLOGIC HISTORY: Updated May 2018 Menarche age 89, she is GX P0. She does not recall when she went through menopause. She never took hormone replacement.   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 Husband [name] is retired from [employer]. They have no children and no pets. They attend a local [address]    ADVANCED DIRECTIVES: Not in place   HEALTH MAINTENANCE: Social History   Tobacco Use   Smoking status: Never   Smokeless tobacco: Never  Vaping Use   Vaping status: Never Used  Substance Use Topics   Alcohol use: No   Drug use: No     Colonoscopy: Never  PAP: Remote  Bone density: 09/27/2013, osteoporosis with a T score of -2.8  Lipid panel: Not on file    No Known Allergies   Current Outpatient Medications  Medication Sig Dispense Refill   denosumab (PROLIA) 60 MG/ML SOLN injection Inject 60 mg into the skin every 6 (six) months. Administer in upper arm, thigh, or abdomen     ELIQUIS 5 MG TABS tablet TAKE 1 TABLET BY MOUTH TWICE A DAY 60 tablet 2   ferrous sulfate 325 (65 FE) MG tablet Take 1 tablet (325 mg total) by mouth daily before breakfast. 90 tablet 0   [Paused] fulvestrant (FASLODEX) 250 MG/5ML  injection Inject 250-500 mg into the muscle See admin instructions. Loading dose of 500mg  on days 1,15, and 29. THEN 250mg  every 28 days thereafter     furosemide (LASIX) 20 MG tablet Take 20 mg by mouth daily.     Milk Thistle 1000 MG CAPS Take 1,000 mg by mouth daily.     [Paused] palbociclib (IBRANCE) 75 MG tablet Take 1 tablet (75 mg total) by mouth daily. Take for 21 days on, 7 days off, repeat every 28 days. 21 tablet 3   pantoprazole (PROTONIX) 40 MG tablet Take 40 mg by mouth daily.     propranolol (INDERAL) 20 MG tablet Take 1 tablet (20 mg total) by mouth 2 (two) times daily. 60 tablet 0   spironolactone (ALDACTONE) 25 MG tablet Take 25 mg by mouth daily.  venlafaxine XR (EFFEXOR-XR) 150 MG 24 hr capsule Take 1 capsule (150 mg total) by mouth daily with breakfast. 90 capsule 3   No current facility-administered medications for this visit.   Facility-Administered Medications Ordered in Other Visits  Medication Dose Route Frequency Provider Last Rate Last Admin   denosumab (PROLIA) injection 60 mg  60 mg Subcutaneous Once Magrinat, Valentino Hue, MD        OBJECTIVE: white woman who appears younger than stated age  There were no vitals filed for this visit.   Physical Exam Constitutional:      Appearance: Normal appearance. She is not ill-appearing.     Comments:    Cardiovascular:     Rate and Rhythm: Normal rate and regular rhythm.     Pulses: Normal pulses.     Heart sounds: Normal heart sounds.  Pulmonary:     Effort: Pulmonary effort is normal.     Breath sounds: Normal breath sounds.  Abdominal:     General: There is no distension.  Musculoskeletal:        General: No swelling or tenderness. Normal range of motion.     Cervical back: Normal range of motion. No rigidity.  Lymphadenopathy:     Cervical: No cervical adenopathy.  Skin:    General: Skin is warm and dry.  Neurological:     General: No focal deficit present.     Mental Status: She is alert.    LAB  RESULTS:  CMP     Component Value Date/Time   NA 139 03/10/2024 1113   NA 142 11/17/2017 1355   K 3.7 03/10/2024 1113   K 4.4 11/17/2017 1355   CL 106 03/10/2024 1113   CL 109 (H) 04/20/2013 0859   CO2 25 03/10/2024 1113   CO2 22 11/17/2017 1355   GLUCOSE 98 03/10/2024 1113   GLUCOSE 86 11/17/2017 1355   GLUCOSE 91 04/20/2013 0859   BUN 19 03/10/2024 1113   BUN 12.5 11/17/2017 1355   CREATININE 1.16 (H) 03/10/2024 1113   CREATININE 1.1 11/17/2017 1355   CALCIUM 8.9 03/10/2024 1113   CALCIUM 9.2 11/17/2017 1355   PROT 6.4 (L) 03/10/2024 1113   PROT 7.0 11/17/2017 1355   ALBUMIN 3.9 03/10/2024 1113   ALBUMIN 3.8 11/17/2017 1355   AST 23 03/10/2024 1113   AST 23 11/17/2017 1355   ALT 19 03/10/2024 1113   ALT 22 11/17/2017 1355   ALKPHOS 66 03/10/2024 1113   ALKPHOS 55 11/17/2017 1355   BILITOT 1.3 (H) 03/10/2024 1113   BILITOT 0.49 11/17/2017 1355   GFRNONAA 51 (L) 03/10/2024 1113   GFRNONAA 53 (L) 05/09/2016 1104   GFRAA 53 (L) 06/12/2020 1146   GFRAA 55 (L) 12/13/2019 1200   GFRAA 61 05/09/2016 1104    Lab Results  Component Value Date   TOTALPROTELP 6.0 03/04/2024   ALBUMINELP 3.4 03/04/2024   A1GS 0.4 03/04/2024   A2GS 0.6 03/04/2024   BETS 1.1 03/04/2024   GAMS 0.6 03/04/2024   MSPIKE Not Observed 03/04/2024   SPEI Comment 03/04/2024    Lab Results  Component Value Date   WBC 4.1 03/10/2024   NEUTROABS 2.9 03/10/2024   HGB 9.1 (L) 03/10/2024   HCT 30.8 (L) 03/10/2024   MCV 95.4 03/10/2024   PLT 91 (L) 03/10/2024    Lab Results  Component Value Date   LABCA2 18 08/21/2012    No components found for: "ZOXWRU045"  No results for input(s): "INR" in the last 168 hours.  Lab Results  Component Value Date   LABCA2 18 08/21/2012    No results found for: "AVW098"  No results found for: "CAN125"  No results found for: "JXB147"  Lab Results  Component Value Date   CA2729 26.9 12/13/2019    No components found for: "HGQUANT"  No  results found for: "CEA1", "CEA" / No results found for: "CEA1", "CEA"   No results found for: "AFPTUMOR"  No results found for: "CHROMOGRNA"  No results found for: "KPAFRELGTCHN", "LAMBDASER", "KAPLAMBRATIO" (kappa/lambda light chains)  No results found for: "HGBA", "HGBA2QUANT", "HGBFQUANT", "HGBSQUAN" (Hemoglobinopathy evaluation)   Lab Results  Component Value Date   LDH 195 08/21/2012    Lab Results  Component Value Date   IRON 236 (H) 03/04/2024   TIBC 634 (H) 03/04/2024   IRONPCTSAT 37 (H) 03/04/2024   (Iron and TIBC)  Lab Results  Component Value Date   FERRITIN 8 (L) 03/04/2024    Urinalysis    Component Value Date/Time   COLORURINE YELLOW 06/28/2022 1122   APPEARANCEUR CLEAR 06/28/2022 1122   LABSPEC 1.015 06/28/2022 1122   LABSPEC 1.010 04/29/2016 1118   PHURINE 6.0 06/28/2022 1122   GLUCOSEU NEGATIVE 06/28/2022 1122   GLUCOSEU Negative 04/29/2016 1118   HGBUR NEGATIVE 06/28/2022 1122   BILIRUBINUR NEGATIVE 06/28/2022 1122   BILIRUBINUR Negative 04/29/2016 1118   KETONESUR NEGATIVE 06/28/2022 1122   PROTEINUR NEGATIVE 06/28/2022 1122   UROBILINOGEN 0.2 04/29/2016 1118   NITRITE NEGATIVE 06/28/2022 1122   LEUKOCYTESUR NEGATIVE 06/28/2022 1122   LEUKOCYTESUR Trace 04/29/2016 1118   CBC from today shows significant improvement in hemoglobin.  White blood cell count is 3100, total neutrophil count is 2500, no thrombocytopenia  STUDIES: DG Chest 2 View Result Date: 03/03/2024 CLINICAL DATA:  History of breast cancer presenting with low hemoglobin and weakness. EXAM: CHEST - 2 VIEW COMPARISON:  October 01, 2022 FINDINGS: The heart size and mediastinal contours are within normal limits. Ill-defined surgical sutures are seen within the perihilar region on the left. Very mild areas of scarring and/or atelectasis are seen within the right lung base and in the mid to upper lung fields, bilaterally. There is a small right pleural effusion. A stable small to moderate  size left pleural effusion is also seen. No pneumothorax is identified. Radiopaque surgical clips are seen along the right axilla. Radiopaque vascular coils are seen within the medial aspect of the left upper quadrant. No acute osseous abnormalities are identified. IMPRESSION: 1. Very mild right basilar and bilateral mid to upper lung field scarring and/or atelectasis. 2. Small right pleural effusion. 3. Stable small to moderate size left pleural effusion. Electronically Signed   By: Aram Candela M.D.   On: 03/03/2024 20:24    COMPARISON: Abdominal ultrasound November 05, 2021   TECHNIQUE:  Real time limited abdomen ultrasound was performed.   IMAGE QUALITY: Satisfactory, of diagnostic value.   FINDINGS:   Limited sonographic views of the abdomen demonstrates mild abdominal ascites. There is no large pocket to safely drain.    IMPRESSION:  IMPRESSION:  1. Mild abdominal ascites.  2. No large enough pocket to safely drain.    ASSESSMENT: 69 y.o. [place] woman presenting June 2011 with stage IV breast cancer involving the right breast upper outer quadrant, right axilla, mediastinal lymph nodes, and both lungs, but not the brain, liver, or bones  (1) positive right breast and right axillary lymph node biopsies 07/17/2010 of a clinical T4 N2 M1 invasive ductal carcinoma, grade 1, estrogen receptor 100% positive,  progesterone receptor 6% positive, with an MIB-1 of 12%, and no HER-2 amplification.  (2) participated in Phase II tessetaxel study, receiving 2 cycles complicated by thrombocytopenia, anemia requiring transfusion, transaminase elevation, and afebrile neutropenia. Off-study as of September 2011. Chest CT scan September 2011 did show evidence of response.  (3) on letrozole as of October 2011, with continuing response and good tolerance   (4) 06/02/2012 underwent right mastectomy and axillary lymph node sampling (3 lymph nodes removed, all with viable cancer as well as evidence of  treatment effect) for a ypT2 ypN1-2 invasive ductal carcinoma, grade 1, 98% estrogen receptor positive, progesterone receptor negative, with no HER-2 amplification  (5) left jugular vein DVT documented 06/16/2012; left sided Port-A-Cath removed mid July 2015; Coumadin stopped 09/07/2014.  (a) Left brachiocephalic v. slightly narrowed, with some Left lower neck collateralization noted on chest CT December 2015  (6) osteoporosis, was on alendronate, switched to denosumab/Prolia 11/07/2016  (a) bone density in 10/2018 shows osteopenia T score of -2.2  (7) portal hypertension, with splenomegaly, gastric varices, and intestinal edema  (a)  mild persistent leukopenia and thrombocytopenia likely due to splenomegaly  (b) iron deficiency secondary to above, status post Feraheme x2 December 2018  (8) restaging studies:  (a) chest CT 12/26/2017 shows a 4.5 mm left lower lobe pulmonary nodule which is unchanged.  There was no other evidence of active disease  (b) restaging chest CT in 05/2019 shows no change in bilateral pulmonary nodules, prominent mediastinal lymph nodes are stable  (9) CT scan of the abdomen 11/03/2021 shows thrombus in the portal, splenic and superior mesenteric vein  (A) Heparin started 11/03/2021  10. PET scan 05/24/2022, low metabolic activity of right lower pulmonary nodule. No change in size of CT 02/27/2022, Carcinoma with mucinous features consistent with metastatic breast carcinoma, negative margins, no LN involvement.  Prognostics showed ER +100% strong staining, PR +20% staining, Ki-67 of 10% and HER2 2+ by IHC and negative by FISH  PLAN:  Breast cancer On Ibrance, currently held. Last PET scan showed no active disease. Follow-up PET scan needed. - Order PET scan to evaluate status. - Review PET scan results at next visit.  Iron deficiency anemia Hemoglobin 9.1. Likely due to iron deficiency. Negative stool occult test. History of varices may contribute. Tolerated Venofer  well previously. Ibrance doesn't cause IDA although it can certainly contribute to anemia via bone marrow suppression - Arrange iron infusion with Venofer, 3 doses. - Hold Ibrance to assess anemia impact. - Advise on dietary iron sources.  Cirrhosis Cirrhosis with monitored varices. Possible intermittent bleeding contributing to anemia. - Monitor for new symptoms.  Follow-up Follow-up needed for iron infusion response and breast cancer status. Coordinate care for treatments and evaluations. - Schedule follow-up in a month to review labs and PET scan. - Coordinate Faslodex injection with iron infusion.  Total time: 30 min  *Total Encounter Time as defined by the Centers for Medicare and Medicaid Services includes, in addition to the face-to-face time of a patient visit (documented in the note above) non-face-to-face time: obtaining and reviewing outside history, ordering and reviewing medications, tests or procedures, care coordination (communications with other health care professionals or caregivers) and documentation in the medical record.

## 2024-04-02 ENCOUNTER — Inpatient Hospital Stay: Attending: Hematology and Oncology

## 2024-04-02 VITALS — BP 115/62 | HR 68 | Resp 16

## 2024-04-02 DIAGNOSIS — D649 Anemia, unspecified: Secondary | ICD-10-CM | POA: Insufficient documentation

## 2024-04-02 DIAGNOSIS — Z1721 Progesterone receptor positive status: Secondary | ICD-10-CM | POA: Diagnosis not present

## 2024-04-02 DIAGNOSIS — Z5111 Encounter for antineoplastic chemotherapy: Secondary | ICD-10-CM | POA: Diagnosis present

## 2024-04-02 DIAGNOSIS — C50411 Malignant neoplasm of upper-outer quadrant of right female breast: Secondary | ICD-10-CM | POA: Diagnosis not present

## 2024-04-02 DIAGNOSIS — Z9011 Acquired absence of right breast and nipple: Secondary | ICD-10-CM | POA: Diagnosis not present

## 2024-04-02 DIAGNOSIS — J90 Pleural effusion, not elsewhere classified: Secondary | ICD-10-CM | POA: Insufficient documentation

## 2024-04-02 DIAGNOSIS — Z86718 Personal history of other venous thrombosis and embolism: Secondary | ICD-10-CM | POA: Diagnosis not present

## 2024-04-02 DIAGNOSIS — M810 Age-related osteoporosis without current pathological fracture: Secondary | ICD-10-CM | POA: Insufficient documentation

## 2024-04-02 DIAGNOSIS — R188 Other ascites: Secondary | ICD-10-CM | POA: Insufficient documentation

## 2024-04-02 DIAGNOSIS — K7460 Unspecified cirrhosis of liver: Secondary | ICD-10-CM | POA: Diagnosis not present

## 2024-04-02 DIAGNOSIS — Z170 Estrogen receptor positive status [ER+]: Secondary | ICD-10-CM | POA: Insufficient documentation

## 2024-04-02 MED ORDER — SODIUM CHLORIDE 0.9 % IV SOLN
300.0000 mg | Freq: Once | INTRAVENOUS | Status: AC
  Administered 2024-04-02: 300 mg via INTRAVENOUS
  Filled 2024-04-02: qty 300

## 2024-04-02 MED ORDER — SODIUM CHLORIDE 0.9 % IV SOLN
INTRAVENOUS | Status: DC
Start: 2024-04-02 — End: 2024-04-02

## 2024-04-02 NOTE — Patient Instructions (Signed)

## 2024-04-02 NOTE — Progress Notes (Signed)
 Patient did well with her iron infusion- has had quite a lot of iron and declined her 30 minute observation. VSS-  BP 115/62 (BP Location: Left Arm, Patient Position: Sitting)   Pulse 68   Resp 16   SpO2 100%   Ambulatory to the lobby.

## 2024-04-05 ENCOUNTER — Other Ambulatory Visit (HOSPITAL_COMMUNITY): Payer: Self-pay

## 2024-04-15 ENCOUNTER — Inpatient Hospital Stay

## 2024-04-15 ENCOUNTER — Inpatient Hospital Stay (HOSPITAL_BASED_OUTPATIENT_CLINIC_OR_DEPARTMENT_OTHER): Admitting: Hematology and Oncology

## 2024-04-15 VITALS — BP 146/70 | HR 82 | Temp 97.2°F | Resp 16 | Wt 119.7 lb

## 2024-04-15 DIAGNOSIS — M810 Age-related osteoporosis without current pathological fracture: Secondary | ICD-10-CM

## 2024-04-15 DIAGNOSIS — C50911 Malignant neoplasm of unspecified site of right female breast: Secondary | ICD-10-CM

## 2024-04-15 DIAGNOSIS — C7800 Secondary malignant neoplasm of unspecified lung: Secondary | ICD-10-CM

## 2024-04-15 DIAGNOSIS — D649 Anemia, unspecified: Secondary | ICD-10-CM

## 2024-04-15 DIAGNOSIS — Z5111 Encounter for antineoplastic chemotherapy: Secondary | ICD-10-CM | POA: Diagnosis not present

## 2024-04-15 LAB — CBC WITH DIFFERENTIAL/PLATELET
Abs Immature Granulocytes: 0.21 10*3/uL — ABNORMAL HIGH (ref 0.00–0.07)
Basophils Absolute: 0.1 10*3/uL (ref 0.0–0.1)
Basophils Relative: 1 %
Eosinophils Absolute: 0.3 10*3/uL (ref 0.0–0.5)
Eosinophils Relative: 3 %
HCT: 33.5 % — ABNORMAL LOW (ref 36.0–46.0)
Hemoglobin: 10.7 g/dL — ABNORMAL LOW (ref 12.0–15.0)
Immature Granulocytes: 2 %
Lymphocytes Relative: 5 %
Lymphs Abs: 0.5 10*3/uL — ABNORMAL LOW (ref 0.7–4.0)
MCH: 32 pg (ref 26.0–34.0)
MCHC: 31.9 g/dL (ref 30.0–36.0)
MCV: 100.3 fL — ABNORMAL HIGH (ref 80.0–100.0)
Monocytes Absolute: 0.7 10*3/uL (ref 0.1–1.0)
Monocytes Relative: 7 %
Neutro Abs: 8.8 10*3/uL — ABNORMAL HIGH (ref 1.7–7.7)
Neutrophils Relative %: 82 %
Platelets: 171 10*3/uL (ref 150–400)
RBC: 3.34 MIL/uL — ABNORMAL LOW (ref 3.87–5.11)
RDW: 15.8 % — ABNORMAL HIGH (ref 11.5–15.5)
WBC: 10.6 10*3/uL — ABNORMAL HIGH (ref 4.0–10.5)
nRBC: 0 % (ref 0.0–0.2)

## 2024-04-15 MED ORDER — FULVESTRANT 250 MG/5ML IM SOSY
500.0000 mg | PREFILLED_SYRINGE | Freq: Once | INTRAMUSCULAR | Status: AC
  Administered 2024-04-15: 500 mg via INTRAMUSCULAR

## 2024-04-15 NOTE — Progress Notes (Signed)
 [facility]  [phone] Fax:([phone]) [phone]    ID: EISA CONAWAY   DOB: 04-28-1956  MR#: [id number]  PJK#:[id number]  Patient Care Team: [facility] [place] as PCP - General (Family Medicine) Duey Ghent, CRNP as Nurse Practitioner (Nurse Practitioner) Enid Harry, MD as Consulting Physician (General Surgery) Genell Ken, MD as Consulting Physician (Gastroenterology)  CHIEF COMPLAINT:  Metastatic Breast Cancer (s/p right mastectomy)  CURRENT TREATMENT: Ibrance and Faslodex, prolia every 6 months.  INTERVAL HISTORY:  Discussed the use of AI scribe software for clinical note transcription with the patient, who gave verbal consent to proceed. History of Present Illness Nicole [name] is a 68 year old female with breast cancer who presents for follow-up and treatment management.  She feels well overall and notes significant improvement in her hemoglobin levels from a previous low of 5 to 10.7. She has been taking iron supplements, which have contributed to constipation.  There is a history of fluid accumulation in her right lung, noted on a recent PET scan, but it is not causing significant symptoms. A previous attempt to drain the fluid was unsuccessful due to insufficient volume.  She is yet to restart Ibrance. She is concerned about the cost of Ibrance and is awaiting assistance with the medication form.  She reports gaining five pounds recently and is pleased with this progress. No new dental issues are noted. She experiences intermittent constipation, likely due to iron supplements. No significant shortness of breath or swelling.    COVID 19 VACCINATION STATUS: Status post Pfizer x2+ booster August 2021   BREAST CANCER HISTORY: From Dr. Donata Fryer Rubin's 07/25/2010 note:  "This woman has not had any significant medical intervention for some time.  Her last mammogram was about seven years ago.  She noted a right breast mass  about a year ago and did not seek immediate medical attention for this.  She ultimately, after some time, admitted this to her husband and self-referred herself for intervention.  She had a mammogram on 07/17/2010 with an ultrasound of the right breast.  This showed a lobulated mass upper right outer quadrant measuring at least 6.3 x 7.3 cm and large right axillary lymph nodes were also noted.  There was skin thickening overlying the mass.  On physical exam, this mass was about an 8 cm with skin dimpling noted. Discolored area in the skin over the mass, fullness in the axilla.  Biopsy was recommended, which took place on 07/17/2010.  Pathology showed invasive ductal cancer involving both lymph node and breast.  The HER-2 was not amplified.  ER and PR both positive at 100% and 6% respectively.  Proliferative index was 12% involving both the lymph node and breast mass.  An MRI scan of both breasts was performed on 07/22/2010, essentially which showed a large heterogeneously enhancing mass of the right breast with washout kinetics measuring 6.4 x 4.4 x 5.0 cm.  Numerous satellite nodules were seen throughout the dominant mass.  There were several suspicious nodules in the upper central and upper inner quadrant of the right breast.  There were two enhancing subcentimeter nodules seen in the right pectoralis muscle.  No obvious chest wall nodules were seen; however, there was seen some metastatic adenopathy in the mediastinum as well as hila with a 2.4 x 2.0 cm mediastinal lymph node as well as 1.2 x 3.0 cm subcarinal lymph node.  There was a right hilar and probable left hilar adenopathy.  Bilateral pulmonary nodules were  seen and a right T2 lesion was seen anterior right liver as well."  Her subsequent history is as detailed below.   PAST MEDICAL HISTORY: Past Medical History:  Diagnosis Date   Anemia    low iron   Arthritis    fingers   Blood transfusion 2012   Breast CA (HCC)    breast ca dx 06/2010,  stage 4, right , Lung Metasis.Surgery, Chemo, Radiation   Breast cancer (HCC)    Clot    jugular  ?2015   Depression    Esophageal varices (HCC)    Headache    Mirgraine- rare since menopause   Heart murmur    mild, no cardiologist, from birth   Hematemesis 08/2017   History of kidney stones    no pain- shows in CT- small 1 mm bilaterally- kidney   Hypertension    IBD (inflammatory bowel disease)    resolved   Idiopathic cirrhosis (HCC)    pt denies   Peripheral vascular disease (HCC) few yrs ago   clot in right jugular   Portal hypertension (HCC)    Radiation 07/21/12-09/07/12   5940 cGy   Restless legs     PAST SURGICAL HISTORY: Past Surgical History:  Procedure Laterality Date   BIOPSY  08/21/2020   Procedure: BIOPSY;  Surgeon: Genell Ken, MD;  Location: [facility] ENDOSCOPY;  Service: Gastroenterology;;   COLONOSCOPY  2016   DILATION AND CURETTAGE OF UTERUS  2004?   ESOPHAGEAL BANDING  09/04/2017   Procedure: ESOPHAGEAL BANDING;  Surgeon: Genell Ken, MD;  Location: [facility] ENDOSCOPY;  Service: Gastroenterology;;   ESOPHAGEAL BANDING  11/27/2017   Procedure: ESOPHAGEAL BANDING;  Surgeon: Genell Ken, MD;  Location: [facility] ENDOSCOPY;  Service: Gastroenterology;;   ESOPHAGEAL BANDING N/A 04/28/2018   Procedure: ESOPHAGEAL BANDING;  Surgeon: Genell Ken, MD;  Location: [facility]COPY;  Service: Gastroenterology;  Laterality: N/A;   ESOPHAGEAL BANDING  11/17/2018   Procedure: ESOPHAGEAL BANDING;  Surgeon: Genell Ken, MD;  Location: [facility]OSCOPY;  Service: Gastroenterology;;   ESOPHAGEAL BANDING  08/16/2019   Procedure: ESOPHAGEAL BANDING;  Surgeon: Genell Ken, MD;  Location: [facility] ENDOSCOPY;  Service: Gastroenterology;;   ESOPHAGEAL BANDING N/A 08/21/2020   Procedure: ESOPHAGEAL BANDING;  Surgeon: Genell Ken, MD;  Location: [facility];  Service: Gastroenterology;  Laterality: N/A;   ESOPHAGEAL BANDING  10/21/2023   Procedure: ESOPHAGEAL BANDING;  Surgeon: Genell Ken, MD;  Location: [facility]  ENDOSCOPY;  Service: Gastroenterology;;   ESOPHAGOGASTRODUODENOSCOPY N/A 04/28/2018   Procedure: ESOPHAGOGASTRODUODENOSCOPY (EGD);  Surgeon: Genell Ken, MD;  Location: [facility]OSCOPY;  Service: Gastroenterology;  Laterality: N/A;   ESOPHAGOGASTRODUODENOSCOPY N/A 11/17/2018   Procedure: ESOPHAGOGASTRODUODENOSCOPY (EGD);  Surgeon: Genell Ken, MD;  Location: [facility]OSCOPY;  Service: Gastroenterology;  Laterality: N/A;   ESOPHAGOGASTRODUODENOSCOPY N/A 08/13/2022   Procedure: ESOPHAGOGASTRODUODENOSCOPY (EGD);  Surgeon: Genell Ken, MD;  Location: [facility]COPY;  Service: Gastroenterology;  Laterality: N/A;   ESOPHAGOGASTRODUODENOSCOPY (EGD) WITH PROPOFOL N/A 04/11/2016   Procedure: ESOPHAGOGASTRODUODENOSCOPY (EGD) WITH PROPOFOL;  Surgeon: Ozell Blunt, MD;  Location: [facility] ENDOSCOPY;  Service: Endoscopy;  Laterality: N/A;   ESOPHAGOGASTRODUODENOSCOPY (EGD) WITH PROPOFOL N/A 07/08/2016   Procedure: ESOPHAGOGASTRODUODENOSCOPY (EGD) WITH PROPOFOL;  Surgeon: Jolinda Necessary, MD;  Location: [facility] ENDOSCOPY;  Service: Endoscopy;  Laterality: N/A;   ESOPHAGOGASTRODUODENOSCOPY (EGD) WITH PROPOFOL N/A 03/03/2017   Procedure: ESOPHAGOGASTRODUODENOSCOPY (EGD) WITH PROPOFOL;  Surgeon: Jolinda Necessary, MD;  Location: [facility] ENDOSCOPY;  Service: Endoscopy;  Laterality: N/A;   ESOPHAGOGASTRODUODENOSCOPY (EGD) WITH PROPOFOL N/A 09/04/2017   Procedure: ESOPHAGOGASTRODUODENOSCOPY (EGD) WITH PROPOFOL;  Surgeon: Feliberto Hopping,  Marcos Eke, MD;  Location: [facility] ENDOSCOPY;  Service: Gastroenterology;  Laterality: N/A;   ESOPHAGOGASTRODUODENOSCOPY (EGD) WITH PROPOFOL N/A 11/27/2017   Procedure: ESOPHAGOGASTRODUODENOSCOPY (EGD);  Surgeon: Kerin Salen, MD;  Location: [facility] ENDOSCOPY;  Service: Gastroenterology;  Laterality: N/A;   ESOPHAGOGASTRODUODENOSCOPY (EGD) WITH PROPOFOL N/A 08/16/2019   Procedure: ESOPHAGOGASTRODUODENOSCOPY (EGD) WITH PROPOFOL;  Surgeon: Kerin Salen, MD;  Location: [facility];  Service: Gastroenterology;  Laterality: N/A;    ESOPHAGOGASTRODUODENOSCOPY (EGD) WITH PROPOFOL N/A 08/21/2020   Procedure: ESOPHAGOGASTRODUODENOSCOPY (EGD) WITH PROPOFOL;  Surgeon: Kerin Salen, MD;  Location: [facility];  Service: Gastroenterology;  Laterality: N/A;   ESOPHAGOGASTRODUODENOSCOPY (EGD) WITH PROPOFOL N/A 08/24/2021   Procedure: ESOPHAGOGASTRODUODENOSCOPY (EGD) WITH PROPOFOL;  Surgeon: Kerin Salen, MD;  Location: [facility];  Service: Gastroenterology;  Laterality: N/A;   ESOPHAGOGASTRODUODENOSCOPY (EGD) WITH PROPOFOL N/A 06/25/2023   Procedure: ESOPHAGOGASTRODUODENOSCOPY (EGD) WITH PROPOFOL;  Surgeon: Vida Rigger, MD;  Location: [facility] END[facility];  Service: Gastroenterology;  Laterality: N/A;  W. bandging   ESOPHAGOGASTRODUODENOSCOPY (EGD) WITH PROPOFOL N/A 10/21/2023   Procedure: ESOPHAGOGASTRODUODENOSCOPY (EGD) WITH PROPOFOL;  Surgeon: Kerin Salen, MD;  Location: [facility];  Service: Gastroenterology;  Laterality: N/A;  with banding and APC   GASTRIC VARICES BANDING N/A 08/24/2021   Procedure: GASTRIC VARICES BANDING;  Surgeon: Kerin Salen, MD;  Location: [facility];  Service: Gastroenterology;  Laterality: N/A;   GASTRIC VARICES BANDING N/A 08/13/2022   Procedure: GASTRIC VARICES BANDING;  Surgeon: Kerin Salen, MD;  Location: [facility];  Service: Gastroenterology;  Laterality: N/A;   HEMOSTASIS CLIP PLACEMENT  08/21/2020   Procedure: HEMOSTASIS CLIP PLACEMENT;  Surgeon: Kerin Salen, MD;  Location: [facility];  Service: Gastroenterology;;   HOT HEMOSTASIS N/A 10/21/2023   Procedure: HOT HEMOSTASIS (ARGON PLASMA COAGULATION/BICAP);  Surgeon: Kerin Salen, MD;  Location: [facility] ENDOSCOPY;  Service: Gastroenterology;  Laterality: N/A;   INTERCOSTAL NERVE BLOCK Left 07/01/2022   Procedure: INTERCOSTAL NERVE BLOCK;  Surgeon: Loreli Slot, MD;  Location: [facility] OR;  Service: Thoracic;  Laterality: Left;   IR GENERIC HISTORICAL  05/15/2016   IR RADIOLOGIST EVAL & MGMT 05/15/2016 Gilmer Mor, DO [facility] INTERV RAD   IR GENERIC  HISTORICAL  01/14/2017   IR US GUIDE VASC ACCESS RIGHT 01/14/2017 Berdine Dance, MD [facility]   IR GENERIC HISTORICAL  01/14/2017   IR VENOGRAM HEPATIC W HEMODYNAMIC EVALUATION 01/14/2017 Berdine Dance, MD [facility]   IR RADIOLOGIST EVAL & MGMT  06/12/2017   IR RADIOLOGIST EVAL & MGMT  08/11/2018   IR RADIOLOGIST EVAL & MGMT  08/25/2019   IR RADIOLOGIST EVAL & MGMT  08/22/2020   IR RADIOLOGIST EVAL & MGMT  03/11/2022   MASTECTOMY Right 06/02/12   right breast with lymph node removal   NODE DISSECTION Left 07/01/2022   Procedure: NODE DISSECTION;  Surgeon: Loreli Slot, MD;  Location: [facility] OR;  Service: Thoracic;  Laterality: Left;   PORT-A-CATH REMOVAL     PORTACATH PLACEMENT  2011   left side   RADIOFREQUENCY ABLATION  06/25/2023   Procedure: RADIO FREQUENCY ABLATION;  Surgeon: Vida Rigger, MD;  Location: [facility] ENDOSCOPY;  Service: Gastroenterology;;   RADIOLOGY WITH ANESTHESIA N/A 04/13/2016   Procedure: RADIOLOGY WITH ANESTHESIA;  Surgeon: Gilmer Mor, DO;  Location: [facility] OR;  Service: Anesthesiology;  Laterality: N/A;   VIDEO BRONCHOSCOPY WITH ENDOBRONCHIAL NAVIGATION N/A 07/01/2022   Procedure: VIDEO BRONCHOSCOPY WITH ENDOBRONCHIAL NAVIGATION;  Surgeon: Loreli Slot, MD;  Location: [facility] OR;  Service: Thoracic;  Laterality: N/A;   XI ROBOTIC ASSISTED THORACOSCOPY- SEGMENTECTOMY Left 07/01/2022   Procedure:  XI ROBOTIC ASSISTED THORACOSCOPY-LEFT LOWER LOBE BASILAR SEGMENTECTOMY;  Surgeon: Loreli Slot, MD;  Location: [facility] OR;  Service: Thoracic;  Laterality: Left;    FAMILY HISTORY Family History  Problem Relation Age of Onset   COPD Mother    AAA (abdominal aortic aneurysm) Mother    COPD Father    Cancer Neg Hx   The patient's father died at the age of 69 from heart disease. Patient's mother died at the age of 47 status post CABG. The patient had no brothers, 2 sisters. There is no history of breast or ovarian cancer in the family.   GYNECOLOGIC HISTORY: Updated May  2018 Menarche age 29, she is GX P0. She does not recall when she went through menopause. She never took hormone replacement.   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 Husband [name] is retired from [employer]. They have no children and no pets. They attend a local [address].    ADVANCED DIRECTIVES: Not in place   HEALTH MAINTENANCE: Social History   Tobacco Use   Smoking status: Never   Smokeless tobacco: Never  Vaping Use   Vaping status: Never Used  Substance Use Topics   Alcohol use: No   Drug use: No     Colonoscopy: Never  PAP: Remote  Bone density: 09/27/2013, osteoporosis with a T score of -2.8  Lipid panel: Not on file    No Known Allergies   Current Outpatient Medications  Medication Sig Dispense Refill   denosumab (PROLIA) 60 MG/ML SOLN injection Inject 60 mg into the skin every 6 (six) months. Administer in upper arm, thigh, or abdomen     ELIQUIS 5 MG TABS tablet TAKE 1 TABLET BY MOUTH TWICE A DAY 60 tablet 2   ferrous sulfate 325 (65 FE) MG tablet Take 1 tablet (325 mg total) by mouth daily before breakfast. 90 tablet 0   [Paused] fulvestrant (FASLODEX) 250 MG/5ML injection Inject 250-500 mg into the muscle See admin instructions. Loading dose of 500mg  on days 1,15, and 29. THEN 250mg  every 28 days thereafter     furosemide (LASIX) 20 MG tablet Take 20 mg by mouth daily.     Milk Thistle 1000 MG CAPS Take 1,000 mg by mouth daily.     [Paused] palbociclib (IBRANCE) 75 MG tablet Take 1 tablet (75 mg total) by mouth daily. Take for 21 days on, 7 days off, repeat every 28 days. 21 tablet 3   pantoprazole (PROTONIX) 40 MG tablet Take 40 mg by mouth daily.     propranolol (INDERAL) 20 MG tablet Take 1 tablet (20 mg total) by mouth 2 (two) times daily. 60 tablet 0   spironolactone (ALDACTONE) 25 MG tablet Take 25 mg by mouth daily.     venlafaxine XR  (EFFEXOR-XR) 150 MG 24 hr capsule Take 1 capsule (150 mg total) by mouth daily with breakfast. 90 capsule 3   No current facility-administered medications for this visit.   Facility-Administered Medications Ordered in Other Visits  Medication Dose Route Frequency Provider Last Rate Last Admin   denosumab (PROLIA) injection 60 mg  60 mg Subcutaneous Once Magrinat, Valentino Hue, MD        OBJECTIVE: white woman who appears younger than stated age  Vitals:   04/15/24 1110  BP: (!) 146/70  Pulse: 82  Resp: 16  Temp: (!) 97.2 F (36.2 C)  TempSrc: Temporal  SpO2: 100%  Weight: 119 lb 11.2 oz (54.3 kg)     Physical Exam Constitutional:      Appearance: Normal appearance. She is not ill-appearing.     Comments:    Cardiovascular:     Rate and Rhythm: Normal rate and regular rhythm.     Pulses: Normal pulses.     Heart sounds: Normal heart sounds.  Pulmonary:     Effort: Pulmonary effort is normal.     Breath sounds: Normal breath sounds.  Abdominal:     General: There is no distension.  Musculoskeletal:        General: No swelling or tenderness. Normal range of motion.     Cervical back: Normal range of motion. No rigidity.  Lymphadenopathy:     Cervical: No cervical adenopathy.  Skin:    General: Skin is warm and dry.  Neurological:     General: No focal deficit present.     Mental Status: She is alert.    LAB RESULTS:  CMP     Component Value Date/Time   NA 139 03/10/2024 1113   NA 142 11/17/2017 1355   K 3.7 03/10/2024 1113   K 4.4 11/17/2017 1355   CL 106 03/10/2024 1113   CL 109 (H) 04/20/2013 0859   CO2 25 03/10/2024 1113   CO2 22 11/17/2017 1355   GLUCOSE 98 03/10/2024 1113   GLUCOSE 86 11/17/2017 1355   GLUCOSE 91 04/20/2013 0859   BUN 19 03/10/2024 1113   BUN 12.5 11/17/2017 1355   CREATININE 1.16 (H) 03/10/2024 1113   CREATININE 1.1 11/17/2017 1355   CALCIUM 8.9 03/10/2024 1113   CALCIUM 9.2 11/17/2017 1355   PROT 6.4 (L) 03/10/2024 1113   PROT  7.0 11/17/2017 1355   ALBUMIN 3.9 03/10/2024 1113   ALBUMIN 3.8 11/17/2017 1355   AST 23 03/10/2024 1113   AST 23 11/17/2017 1355   ALT 19 03/10/2024 1113   ALT 22 11/17/2017 1355   ALKPHOS 66 03/10/2024 1113   ALKPHOS 55 11/17/2017 1355   BILITOT 1.3 (H) 03/10/2024 1113   BILITOT 0.49 11/17/2017 1355   GFRNONAA 51 (L) 03/10/2024 1113   GFRNONAA 53 (L) 05/09/2016 1104   GFRAA 53 (L) 06/12/2020 1146   GFRAA 55 (L) 12/13/2019 1200   GFRAA 61 05/09/2016 1104    Lab Results  Component Value Date   TOTALPROTELP 6.0 03/04/2024   ALBUMINELP 3.4 03/04/2024   A1GS 0.4 03/04/2024   A2GS 0.6 03/04/2024   BETS 1.1 03/04/2024   GAMS 0.6 03/04/2024   MSPIKE Not Observed 03/04/2024   SPEI Comment 03/04/2024    Lab Results  Component Value Date   WBC 10.6 (H) 04/15/2024   NEUTROABS 8.8 (H) 04/15/2024   HGB 10.7 (L) 04/15/2024   HCT 33.5 (L) 04/15/2024   MCV 100.3 (H) 04/15/2024   PLT 171 04/15/2024    Lab Results  Component Value Date   LABCA2 18 08/21/2012    No components found for: "ZOXWRU045"  No results for input(s): "INR" in the last 168 hours.   Lab Results  Component Value Date   LABCA2 18 08/21/2012    No results found for: "WUJ811"  No results found for: "CAN125"  No results found for: "BJY782"  Lab Results  Component Value Date   CA2729 26.9 12/13/2019    No components found for: "HGQUANT"  No results found for: "CEA1", "CEA" / No results found for: "CEA1", "CEA"   No results found for: "AFPTUMOR"  No results found  for: "CHROMOGRNA"  No results found for: "KPAFRELGTCHN", "LAMBDASER", "KAPLAMBRATIO" (kappa/lambda light chains)  No results found for: "HGBA", "HGBA2QUANT", "HGBFQUANT", "HGBSQUAN" (Hemoglobinopathy evaluation)   Lab Results  Component Value Date   LDH 195 08/21/2012    Lab Results  Component Value Date   IRON 236 (H) 03/04/2024   TIBC 634 (H) 03/04/2024   IRONPCTSAT 37 (H) 03/04/2024   (Iron and TIBC)  Lab Results   Component Value Date   FERRITIN 8 (L) 03/04/2024    Urinalysis    Component Value Date/Time   COLORURINE YELLOW 06/28/2022 1122   APPEARANCEUR CLEAR 06/28/2022 1122   LABSPEC 1.015 06/28/2022 1122   LABSPEC 1.010 04/29/2016 1118   PHURINE 6.0 06/28/2022 1122   GLUCOSEU NEGATIVE 06/28/2022 1122   GLUCOSEU Negative 04/29/2016 1118   HGBUR NEGATIVE 06/28/2022 1122   BILIRUBINUR NEGATIVE 06/28/2022 1122   BILIRUBINUR Negative 04/29/2016 1118   KETONESUR NEGATIVE 06/28/2022 1122   PROTEINUR NEGATIVE 06/28/2022 1122   UROBILINOGEN 0.2 04/29/2016 1118   NITRITE NEGATIVE 06/28/2022 1122   LEUKOCYTESUR NEGATIVE 06/28/2022 1122   LEUKOCYTESUR Trace 04/29/2016 1118   CBC from today shows significant improvement in hemoglobin.  White blood cell count is 3100, total neutrophil count is 2500, no thrombocytopenia  STUDIES: NM PET Image Restage (PS) Skull Base to Thigh (F-18 FDG) Result Date: 03/31/2024 CLINICAL DATA:  Subsequent treatment strategy for metastatic breast cancer. EXAM: NUCLEAR MEDICINE PET SKULL BASE TO THIGH TECHNIQUE: 5.9 mCi F-18 FDG was injected intravenously. Full-ring PET imaging was performed from the skull base to thigh after the radiotracer. CT data was obtained and used for attenuation correction and anatomic localization. Fasting blood glucose: 111 mg/dl COMPARISON:  PET-CT 40/98/1191 and 03/13/2023.  Chest CT 11/29/2023. FINDINGS: Mediastinal blood pool activity: SUV max 1.6 NECK: No hypermetabolic cervical lymph nodes are identified. No suspicious activity identified within the pharyngeal mucosal space. Incidental CT findings: Left carotid atherosclerosis. CHEST: Again demonstrated is mild hypermetabolic activity in the right hilum (SUV max 2.6), improved from previous studies (most recently 4.1, 4.7 on 03/13/2023). No other hypermetabolic mediastinal, hilar, axillary or internal mammary lymph nodes. No hypermetabolic pulmonary activity or suspicious nodularity. No suspicious  breast activity post right mastectomy. Incidental CT findings: New moderate-sized dependent right pleural effusion without associated hypermetabolic activity. Stable chronic small partially loculated left pleural effusion. Stable postsurgical changes in the left lung. Stable centrilobular emphysema with scattered central airway thickening, subpleural scarring and clustered left upper lobe nodularity. Mild atherosclerosis of the aorta, great vessels and coronary arteries. ABDOMEN/PELVIS: There is no hypermetabolic activity within the liver, adrenal glands, spleen or pancreas. There is no hypermetabolic nodal activity in the abdomen or pelvis. Incidental CT findings: Cirrhotic morphology of the liver with stable enlargement of the left lobe. Ascites seen on the most recent chest CT has resolved. Unchanged embolization coils adjacent to the left diaphragmatic crus. Possible punctate nonobstructing right renal calculus. No evidence of ureteral calculus or hydronephrosis. Mild aortoiliac atherosclerosis. SKELETON: There is no hypermetabolic activity to suggest active osseous metastatic disease. Stable L2 sclerosis which may be secondary to a healed fracture or treated metastatic disease. Incidental CT findings: Multilevel spondylosis. IMPRESSION: 1. Improved mild hypermetabolic activity in the right hilum, which may be reactive or secondary to treated metastatic disease. 2. Otherwise stable examination without evidence of local breast cancer recurrence or other metabolically active metastatic disease. Unchanged chronic sclerosis of the L2 vertebral body. 3. Recurrent, nonspecific moderate-sized dependent right pleural effusion without associated hypermetabolic activity. Stable chronic small partially loculated  left pleural effusion. 4. Stable cirrhotic morphology of the liver. 5. Aortic Atherosclerosis (ICD10-I70.0) and Emphysema (ICD10-J43.9). Electronically Signed   By: Elmon Hagedorn M.D.   On: 03/31/2024 18:40     COMPARISON: Abdominal ultrasound November 05, 2021   TECHNIQUE:  Real time limited abdomen ultrasound was performed.   IMAGE QUALITY: Satisfactory, of diagnostic value.   FINDINGS:   Limited sonographic views of the abdomen demonstrates mild abdominal ascites. There is no large pocket to safely drain.    IMPRESSION:  IMPRESSION:  1. Mild abdominal ascites.  2. No large enough pocket to safely drain.    ASSESSMENT: 68 y.o. [place] woman presenting June 2011 with stage IV breast cancer involving the right breast upper outer quadrant, right axilla, mediastinal lymph nodes, and both lungs, but not the brain, liver, or bones  (1) positive right breast and right axillary lymph node biopsies 07/17/2010 of a clinical T4 N2 M1 invasive ductal carcinoma, grade 1, estrogen receptor 100% positive, progesterone receptor 6% positive, with an MIB-1 of 12%, and no HER-2 amplification.  (2) participated in Phase II tessetaxel study, receiving 2 cycles complicated by thrombocytopenia, anemia requiring transfusion, transaminase elevation, and afebrile neutropenia. Off-study as of September 2011. Chest CT scan September 2011 did show evidence of response.  (3) on letrozole as of October 2011, with continuing response and good tolerance   (4) 06/02/2012 underwent right mastectomy and axillary lymph node sampling (3 lymph nodes removed, all with viable cancer as well as evidence of treatment effect) for a ypT2 ypN1-2 invasive ductal carcinoma, grade 1, 98% estrogen receptor positive, progesterone receptor negative, with no HER-2 amplification  (5) left jugular vein DVT documented 06/16/2012; left sided Port-A-Cath removed mid July 2015; Coumadin stopped 09/07/2014.  (a) Left brachiocephalic v. slightly narrowed, with some Left lower neck collateralization noted on chest CT December 2015  (6) osteoporosis, was on alendronate, switched to denosumab/Prolia 11/07/2016  (a) bone density in 10/2018 shows  osteopenia T score of -2.2  (7) portal hypertension, with splenomegaly, gastric varices, and intestinal edema  (a)  mild persistent leukopenia and thrombocytopenia likely due to splenomegaly  (b) iron deficiency secondary to above, status post Feraheme x2 December 2018  (8) restaging studies:  (a) chest CT 12/26/2017 shows a 4.5 mm left lower lobe pulmonary nodule which is unchanged.  There was no other evidence of active disease  (b) restaging chest CT in 05/2019 shows no change in bilateral pulmonary nodules, prominent mediastinal lymph nodes are stable  (9) CT scan of the abdomen 11/03/2021 shows thrombus in the portal, splenic and superior mesenteric vein  (A) Heparin started 11/03/2021  10. PET scan 05/24/2022, low metabolic activity of right lower pulmonary nodule. No change in size of CT 02/27/2022, Carcinoma with mucinous features consistent with metastatic breast carcinoma, negative margins, no LN involvement.  Prognostics showed ER +100% strong staining, PR +20% staining, Ki-67 of 10% and HER2 2+ by IHC and negative by [facility]  PLAN:  Assessment & Plan Breast cancer PET scan shows no significant metabolic activity.  - Administer Faslodex injection today. - Restart Ibrance at the lowest dose, 21 days on and 7 days off. - Follow up on the Ibrance assistance form with the pharmacist.  Pleural effusion Moderate-sized right pleural effusion, asymptomatic, likely unrelated to cancer. No hypermetabolic activity noted on this. - Monitor for symptoms of shortness of breath. - Consider thoracentesis if symptomatic.  Anemia Hemoglobin improved to 10.7, indicating recovery from severe anemia, likely due to iron deficiency.  - Continue  iron supplementation every other day. - Monitor hemoglobin and iron levels. - Initiate B12 supplementation with 1000 mcg daily.  Cirrhosis Cirrhosis with monitored varices. Possible intermittent bleeding contributing to anemia. - Monitor for new  symptoms.  Follow-up Follow-up in 4 weeks  Total time: 30 min  *Total Encounter Time as defined by the Centers for Medicare and Medicaid Services includes, in addition to the face-to-face time of a patient visit (documented in the note above) non-face-to-face time: obtaining and reviewing outside history, ordering and reviewing medications, tests or procedures, care coordination (communications with other health care professionals or caregivers) and documentation in the medical record.

## 2024-04-16 ENCOUNTER — Other Ambulatory Visit: Payer: Self-pay | Admitting: *Deleted

## 2024-04-16 ENCOUNTER — Telehealth: Payer: Self-pay

## 2024-04-16 ENCOUNTER — Other Ambulatory Visit (HOSPITAL_COMMUNITY): Payer: Self-pay

## 2024-04-16 DIAGNOSIS — Z170 Estrogen receptor positive status [ER+]: Secondary | ICD-10-CM

## 2024-04-16 MED ORDER — PALBOCICLIB 75 MG PO TABS
75.0000 mg | ORAL_TABLET | Freq: Every day | ORAL | 3 refills | Status: DC
Start: 2024-04-16 — End: 2024-07-30
  Filled 2024-04-20: qty 21, 28d supply, fill #0
  Filled 2024-05-13: qty 21, 28d supply, fill #1
  Filled 2024-06-11: qty 21, 28d supply, fill #2
  Filled 2024-07-07: qty 21, 28d supply, fill #3

## 2024-04-16 NOTE — Telephone Encounter (Signed)
 Received notification that patient needed assistance with Ibrance . Looking at patient's chart, patient had been receiving medication from [facility] but patient had been taking medication differently than prescribed. Patient is now needing refill of medication. [employer] was obtained in November of 2024 to provide coverage for 2025. Patient's grant was auto cancelled on 03/28/24 since no claim had been submitted. I called [employer] at [phone] and had grant re-instated.    Oral Oncology Patient Advocate Encounter   Was successful in securing patient a $6,000.00 grant from [employer] to provide copayment coverage for Ibrance .  This will keep the out of pocket expense at $0.    The billing information is as follows and has been shared with [facility].   Member ID: [id number] Group ID: [id number] RxBin: [id number] PCN: [id number] Dates of Eligibility: 11/11/23 through 11/10/24  Fund name:  Metast[employer] Breast [employer].   Morene Potters, CPhT Pharmacy Technician Coordinator [facility] [phone] (Ph) 04/16/2024 10:48 AM

## 2024-04-20 ENCOUNTER — Other Ambulatory Visit: Payer: Self-pay | Admitting: Pharmacy Technician

## 2024-04-20 ENCOUNTER — Other Ambulatory Visit: Payer: Self-pay

## 2024-04-20 ENCOUNTER — Encounter: Payer: Self-pay | Admitting: Hematology and Oncology

## 2024-04-20 ENCOUNTER — Other Ambulatory Visit (HOSPITAL_COMMUNITY): Payer: Self-pay

## 2024-04-20 NOTE — Progress Notes (Signed)
 Patient transferring to [facility]. Patient initially counseled in clinic visit note on 09/03/22.

## 2024-04-20 NOTE — Progress Notes (Signed)
 Specialty Pharmacy Initial Fill Coordination Note  Nicole [name] is a 68 y.o. female contacted today regarding refills of specialty medication(s) Palbociclib  (IBRANCE ) .  Patient requested Delivery  on 04/21/24  to verified address [address]   [place] [address]   Medication will be filled on 04/20/24.   Patient is aware of $0 copayment.

## 2024-04-21 ENCOUNTER — Other Ambulatory Visit (HOSPITAL_COMMUNITY): Payer: Self-pay

## 2024-04-21 ENCOUNTER — Encounter: Payer: Self-pay | Admitting: Hematology and Oncology

## 2024-04-25 ENCOUNTER — Other Ambulatory Visit: Payer: Self-pay | Admitting: Hematology and Oncology

## 2024-04-26 ENCOUNTER — Encounter: Payer: Self-pay | Admitting: Hematology and Oncology

## 2024-05-07 ENCOUNTER — Other Ambulatory Visit: Payer: Self-pay

## 2024-05-11 ENCOUNTER — Other Ambulatory Visit (HOSPITAL_COMMUNITY): Payer: Self-pay

## 2024-05-13 ENCOUNTER — Other Ambulatory Visit: Payer: Self-pay

## 2024-05-13 ENCOUNTER — Other Ambulatory Visit: Payer: Self-pay | Admitting: Pharmacy Technician

## 2024-05-13 NOTE — Progress Notes (Signed)
 Specialty Pharmacy Refill Coordination Note  Nicole [name] is a 68 y.o. female contacted today regarding refills of specialty medication(s) Palbociclib  (IBRANCE )   Patient requested Delivery   Delivery date: 05/17/24   Verified address: [address] [place]  [address] [place]    Medication will be filled on 05/14/24.   Sent patient mychart message change of delivery date. Provided callback number if patient have questions or concerns.

## 2024-05-17 ENCOUNTER — Other Ambulatory Visit: Payer: Self-pay | Admitting: *Deleted

## 2024-05-17 DIAGNOSIS — D500 Iron deficiency anemia secondary to blood loss (chronic): Secondary | ICD-10-CM

## 2024-05-17 DIAGNOSIS — C50911 Malignant neoplasm of unspecified site of right female breast: Secondary | ICD-10-CM

## 2024-05-26 ENCOUNTER — Telehealth: Payer: Self-pay

## 2024-05-26 NOTE — Telephone Encounter (Signed)
 Verbally confirmed appointments for 5/29

## 2024-05-27 ENCOUNTER — Inpatient Hospital Stay (HOSPITAL_BASED_OUTPATIENT_CLINIC_OR_DEPARTMENT_OTHER): Admitting: Hematology and Oncology

## 2024-05-27 ENCOUNTER — Inpatient Hospital Stay

## 2024-05-27 ENCOUNTER — Encounter: Payer: Self-pay | Admitting: Hematology and Oncology

## 2024-05-27 ENCOUNTER — Inpatient Hospital Stay: Attending: Hematology and Oncology

## 2024-05-27 VITALS — BP 130/68 | HR 76 | Temp 98.4°F | Resp 15

## 2024-05-27 VITALS — BP 119/69 | HR 90 | Temp 97.7°F | Resp 17 | Wt 118.3 lb

## 2024-05-27 DIAGNOSIS — D696 Thrombocytopenia, unspecified: Secondary | ICD-10-CM | POA: Diagnosis not present

## 2024-05-27 DIAGNOSIS — C50411 Malignant neoplasm of upper-outer quadrant of right female breast: Secondary | ICD-10-CM | POA: Diagnosis not present

## 2024-05-27 DIAGNOSIS — D500 Iron deficiency anemia secondary to blood loss (chronic): Secondary | ICD-10-CM

## 2024-05-27 DIAGNOSIS — D701 Agranulocytosis secondary to cancer chemotherapy: Secondary | ICD-10-CM | POA: Diagnosis not present

## 2024-05-27 DIAGNOSIS — Z5111 Encounter for antineoplastic chemotherapy: Secondary | ICD-10-CM | POA: Insufficient documentation

## 2024-05-27 DIAGNOSIS — C7801 Secondary malignant neoplasm of right lung: Secondary | ICD-10-CM | POA: Insufficient documentation

## 2024-05-27 DIAGNOSIS — R161 Splenomegaly, not elsewhere classified: Secondary | ICD-10-CM | POA: Insufficient documentation

## 2024-05-27 DIAGNOSIS — Z1721 Progesterone receptor positive status: Secondary | ICD-10-CM | POA: Diagnosis not present

## 2024-05-27 DIAGNOSIS — C7802 Secondary malignant neoplasm of left lung: Secondary | ICD-10-CM | POA: Insufficient documentation

## 2024-05-27 DIAGNOSIS — Z170 Estrogen receptor positive status [ER+]: Secondary | ICD-10-CM | POA: Insufficient documentation

## 2024-05-27 DIAGNOSIS — Z1732 Human epidermal growth factor receptor 2 negative status: Secondary | ICD-10-CM | POA: Insufficient documentation

## 2024-05-27 DIAGNOSIS — K766 Portal hypertension: Secondary | ICD-10-CM | POA: Insufficient documentation

## 2024-05-27 DIAGNOSIS — C50911 Malignant neoplasm of unspecified site of right female breast: Secondary | ICD-10-CM

## 2024-05-27 DIAGNOSIS — M8580 Other specified disorders of bone density and structure, unspecified site: Secondary | ICD-10-CM | POA: Insufficient documentation

## 2024-05-27 DIAGNOSIS — C778 Secondary and unspecified malignant neoplasm of lymph nodes of multiple regions: Secondary | ICD-10-CM | POA: Insufficient documentation

## 2024-05-27 DIAGNOSIS — M810 Age-related osteoporosis without current pathological fracture: Secondary | ICD-10-CM

## 2024-05-27 DIAGNOSIS — E611 Iron deficiency: Secondary | ICD-10-CM | POA: Insufficient documentation

## 2024-05-27 DIAGNOSIS — N289 Disorder of kidney and ureter, unspecified: Secondary | ICD-10-CM | POA: Insufficient documentation

## 2024-05-27 DIAGNOSIS — C7800 Secondary malignant neoplasm of unspecified lung: Secondary | ICD-10-CM

## 2024-05-27 LAB — CBC WITH DIFFERENTIAL (CANCER CENTER ONLY)
Abs Immature Granulocytes: 0.01 10*3/uL (ref 0.00–0.07)
Basophils Absolute: 0 10*3/uL (ref 0.0–0.1)
Basophils Relative: 1 %
Eosinophils Absolute: 0 10*3/uL (ref 0.0–0.5)
Eosinophils Relative: 1 %
HCT: 36.9 % (ref 36.0–46.0)
Hemoglobin: 12.4 g/dL (ref 12.0–15.0)
Immature Granulocytes: 0 %
Lymphocytes Relative: 12 %
Lymphs Abs: 0.3 10*3/uL — ABNORMAL LOW (ref 0.7–4.0)
MCH: 33.1 pg (ref 26.0–34.0)
MCHC: 33.6 g/dL (ref 30.0–36.0)
MCV: 98.4 fL (ref 80.0–100.0)
Monocytes Absolute: 0.2 10*3/uL (ref 0.1–1.0)
Monocytes Relative: 7 %
Neutro Abs: 1.9 10*3/uL (ref 1.7–7.7)
Neutrophils Relative %: 79 %
Platelet Count: 173 10*3/uL (ref 150–400)
RBC: 3.75 MIL/uL — ABNORMAL LOW (ref 3.87–5.11)
RDW: 18.2 % — ABNORMAL HIGH (ref 11.5–15.5)
WBC Count: 2.5 10*3/uL — ABNORMAL LOW (ref 4.0–10.5)
nRBC: 0 % (ref 0.0–0.2)

## 2024-05-27 LAB — CMP (CANCER CENTER ONLY)
ALT: 16 U/L (ref 0–44)
AST: 19 U/L (ref 15–41)
Albumin: 4.4 g/dL (ref 3.5–5.0)
Alkaline Phosphatase: 62 U/L (ref 38–126)
Anion gap: 8 (ref 5–15)
BUN: 15 mg/dL (ref 8–23)
CO2: 26 mmol/L (ref 22–32)
Calcium: 9.8 mg/dL (ref 8.9–10.3)
Chloride: 107 mmol/L (ref 98–111)
Creatinine: 1.24 mg/dL — ABNORMAL HIGH (ref 0.44–1.00)
GFR, Estimated: 47 mL/min — ABNORMAL LOW (ref >60)
Glucose, Bld: 90 mg/dL (ref 70–99)
Potassium: 4.1 mmol/L (ref 3.5–5.1)
Sodium: 141 mmol/L (ref 135–145)
Total Bilirubin: 0.5 mg/dL (ref 0.0–1.2)
Total Protein: 7.2 g/dL (ref 6.5–8.1)

## 2024-05-27 MED ORDER — FULVESTRANT 250 MG/5ML IM SOSY
500.0000 mg | PREFILLED_SYRINGE | Freq: Once | INTRAMUSCULAR | Status: AC
  Administered 2024-05-27: 500 mg via INTRAMUSCULAR
  Filled 2024-05-27: qty 10

## 2024-05-27 NOTE — Progress Notes (Signed)
 [facility]  [phone] Fax:([phone]) [phone]    ID: Nicole [name]   DOB: 10-29-1956  MR#: [id number]  NFA#:[id number]  Patient Care Team: [facility] [place] as PCP - General (Family Medicine) Duey Ghent, CRNP as Nurse Practitioner (Nurse Practitioner) Enid Harry, MD as Consulting Physician (General Surgery) Genell Ken, MD as Consulting Physician (Gastroenterology) Murleen Arms, MD as Consulting Physician (Hematology and Oncology)  CHIEF COMPLAINT:  Metastatic Breast Cancer (s/p right mastectomy)  CURRENT TREATMENT: Ibrance  and Faslodex , prolia  every 6 months.  INTERVAL HISTORY:  Discussed the use of AI scribe software for clinical note transcription with the patient, who gave verbal consent to proceed. History of Present Illness   Nicole [name] is a 68 year old female with breast cancer who presents for routine follow-up.  She is currently undergoing treatment with Ibrance  and Faslodex . She feels better overall with an improved appetite and notes her skin color has returned to a healthy tone. No new or worsening shortness of breath. Her hemoglobin level is 12.4, and she has a slightly low white blood cell count due to her medication regimen.  Her creatinine level is 1.2, and she has mild kidney impairment, which has been stable over time. She inquired about a possible relation to a kidney stone.  She follows a regimen of Ibrance , taken three weeks on and one week off, and received her Faslodex  shot today. She is due for a bone strengthener, which she has not received recently due to missed appointments when she was unwell.  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 She is concerned about her knee, although no specific symptoms are detailed.    COVID 19 VACCINATION STATUS: Status post Pfizer x2+ booster August 2021   BREAST CANCER HISTORY: From Dr. Donata Fryer Rubin's 07/25/2010  note:  "This woman has not had any significant medical intervention for some time.  Her last mammogram was about seven years ago.  She noted a right breast mass about a year ago and did not seek immediate medical attention for this.  She ultimately, after some time, admitted this to her husband and self-referred herself for intervention.  She had a mammogram on 07/17/2010 with an ultrasound of the right breast.  This showed a lobulated mass upper right outer quadrant measuring at least 6.3 x 7.3 cm and large right axillary lymph nodes were also noted.  There was skin thickening overlying the mass.  On physical exam, this mass was about an 8 cm with skin dimpling noted. Discolored area in the skin over the mass, fullness in the axilla.  Biopsy was recommended, which took place on 07/17/2010.  Pathology showed invasive ductal cancer involving both lymph node and breast.  The HER-2 was not amplified.  ER and PR both positive at 100% and 6% respectively.  Proliferative index was 12% involving both the lymph node and breast mass.  An MRI scan of both breasts was performed on 07/22/2010, essentially which showed a large heterogeneously enhancing mass of the right breast with washout kinetics measuring 6.4 x 4.4 x 5.0 cm.  Numerous satellite nodules were seen throughout the dominant mass.  There were several suspicious nodules in the upper central and upper inner quadrant of the right breast.  There were two enhancing subcentimeter nodules seen in the right pectoralis muscle.  No obvious chest wall nodules were seen; however, there was seen some metastatic adenopathy in the mediastinum as well as hila with  a 2.4 x 2.0 cm mediastinal lymph node as well as 1.2 x 3.0 cm subcarinal lymph node.  There was a right hilar and probable left hilar adenopathy.  Bilateral pulmonary nodules were seen and a right T2 lesion was seen anterior right liver as well."  Her subsequent history is as detailed below.   PAST MEDICAL  HISTORY: Past Medical History:  Diagnosis Date   Anemia    low iron    Arthritis    fingers   Blood transfusion 2012   Breast CA (HCC)    breast ca dx 06/2010, stage 4, right , Lung Metasis.Surgery, Chemo, Radiation   Breast cancer (HCC)    Clot    jugular  ?2015   Depression    Esophageal varices (HCC)    Headache    Mirgraine- rare since menopause   Heart murmur    mild, no cardiologist, from birth   Hematemesis 08/2017   History of kidney stones    no pain- shows in CT- small 1 mm bilaterally- kidney   Hypertension    IBD (inflammatory bowel disease)    resolved   Idiopathic cirrhosis (HCC)    pt denies   Peripheral vascular disease (HCC) few yrs ago   clot in right jugular   Portal hypertension (HCC)    Radiation 07/21/12-09/07/12   5940 cGy   Restless legs     PAST SURGICAL HISTORY: Past Surgical History:  Procedure Laterality Date   BIOPSY  08/21/2020   Procedure: BIOPSY;  Surgeon: Genell Ken, MD;  Location: [facility] ENDOSCOPY;  Service: Gastroenterology;;   COLONOSCOPY  2016   DILATION AND CURETTAGE OF UTERUS  2004?   ESOPHAGEAL BANDING  09/04/2017   Procedure: ESOPHAGEAL BANDING;  Surgeon: Genell Ken, MD;  Location: [facility] ENDOSCOPY;  Service: Gastroenterology;;   ESOPHAGEAL BANDING  11/27/2017   Procedure: ESOPHAGEAL BANDING;  Surgeon: Genell Ken, MD;  Location: [facility] ENDOSCOPY;  Service: Gastroenterology;;   ESOPHAGEAL BANDING N/A 04/28/2018   Procedure: ESOPHAGEAL BANDING;  Surgeon: Genell Ken, MD;  Location: [facility] ENDOSCOPY;  Service: Gastroenterology;  Laterality: N/A;   ESOPHAGEAL BANDING  11/17/2018   Procedure: ESOPHAGEAL BANDING;  Surgeon: Genell Ken, MD;  Location: [facility] ENDOSCOPY;  Service: Gastroenterology;;   ESOPHAGEAL BANDING  08/16/2019   Procedure: ESOPHAGEAL BANDING;  Surgeon: Genell Ken, MD;  Location: [facility] ENDOSCOPY;  Service: Gastroenterology;;   ESOPHAGEAL BANDING N/A 08/21/2020   Procedure: ESOPHAGEAL BANDING;  Surgeon: Genell Ken, MD;  Location: [facility]  ENDOSCOPY;  Service: Gastroenterology;  Laterality: N/A;   ESOPHAGEAL BANDING  10/21/2023   Procedure: ESOPHAGEAL BANDING;  Surgeon: Genell Ken, MD;  Location: [facility] ENDOSCOPY;  Service: Gastroenterology;;   ESOPHAGOGASTRODUODENOSCOPY N/A 04/28/2018   Procedure: ESOPHAGOGASTRODUODENOSCOPY (EGD);  Surgeon: Genell Ken, MD;  Location: [facility] ENDOSCOPY;  Service: Gastroenterology;  Laterality: N/A;   ESOPHAGOGASTRODUODENOSCOPY N/A 11/17/2018   Procedure: ESOPHAGOGASTRODUODENOSCOPY (EGD);  Surgeon: Genell Ken, MD;  Location: [facility] ENDOSCOPY;  Service: Gastroenterology;  Laterality: N/A;   ESOPHAGOGASTRODUODENOSCOPY N/A 08/13/2022   Procedure: ESOPHAGOGASTRODUODENOSCOPY (EGD);  Surgeon: Genell Ken, MD;  Location: [facility] ENDOSCOPY;  Service: Gastroenterology;  Laterality: N/A;   ESOPHAGOGASTRODUODENOSCOPY (EGD) WITH PROPOFOL  N/A 04/11/2016   Procedure: ESOPHAGOGASTRODUODENOSCOPY (EGD) WITH PROPOFOL ;  Surgeon: Ozell Blunt, MD;  Location: [facility] ENDOSCOPY;  Service: Endoscopy;  Laterality: N/A;   ESOPHAGOGASTRODUODENOSCOPY (EGD) WITH PROPOFOL  N/A 07/08/2016   Procedure: ESOPHAGOGASTRODUODENOSCOPY (EGD) WITH PROPOFOL ;  Surgeon: Jolinda Necessary, MD;  Location: [facility] ENDOSCOPY;  Service: Endoscopy;  Laterality: N/A;   ESOPHAGOGASTRODUODENOSCOPY (EGD) WITH PROPOFOL  N/A 03/03/2017   Procedure: ESOPHAGOGASTRODUODENOSCOPY (EGD) WITH  PROPOFOL ;  Surgeon: Jolinda Necessary, MD;  Location: [facility] ENDOSCOPY;  Service: Endoscopy;  Laterality: N/A;   ESOPHAGOGASTRODUODENOSCOPY (EGD) WITH PROPOFOL  N/A 09/04/2017   Procedure: ESOPHAGOGASTRODUODENOSCOPY (EGD) WITH PROPOFOL ;  Surgeon: Genell Ken, MD;  Location: [facility] ENDOSCOPY;  Service: Gastroenterology;  Laterality: N/A;   ESOPHAGOGASTRODUODENOSCOPY (EGD) WITH PROPOFOL  N/A 11/27/2017   Procedure: ESOPHAGOGASTRODUODENOSCOPY (EGD);  Surgeon: Genell Ken, MD;  Location: [facility] ENDOSCOPY;  Service: Gastroenterology;  Laterality: N/A;   ESOPHAGOGASTRODUODENOSCOPY (EGD) WITH PROPOFOL  N/A 08/16/2019   Procedure:  ESOPHAGOGASTRODUODENOSCOPY (EGD) WITH PROPOFOL ;  Surgeon: Genell Ken, MD;  Location: [facility] ENDOSCOPY;  Service: Gastroenterology;  Laterality: N/A;   ESOPHAGOGASTRODUODENOSCOPY (EGD) WITH PROPOFOL  N/A 08/21/2020   Procedure: ESOPHAGOGASTRODUODENOSCOPY (EGD) WITH PROPOFOL ;  Surgeon: Genell Ken, MD;  Location: [facility] ENDOSCOPY;  Service: Gastroenterology;  Laterality: N/A;   ESOPHAGOGASTRODUODENOSCOPY (EGD) WITH PROPOFOL  N/A 08/24/2021   Procedure: ESOPHAGOGASTRODUODENOSCOPY (EGD) WITH PROPOFOL ;  Surgeon: Genell Ken, MD;  Location: [facility] ENDOSCOPY;  Service: Gastroenterology;  Laterality: N/A;   ESOPHAGOGASTRODUODENOSCOPY (EGD) WITH PROPOFOL  N/A 06/25/2023   Procedure: ESOPHAGOGASTRODUODENOSCOPY (EGD) WITH PROPOFOL ;  Surgeon: Ozell Blunt, MD;  Location: [facility] ENDOSCOPY;  Service: Gastroenterology;  Laterality: N/A;  W. bandging   ESOPHAGOGASTRODUODENOSCOPY (EGD) WITH PROPOFOL  N/A 10/21/2023   Procedure: ESOPHAGOGASTRODUODENOSCOPY (EGD) WITH PROPOFOL ;  Surgeon: Genell Ken, MD;  Location: [facility] ENDOSCOPY;  Service: Gastroenterology;  Laterality: N/A;  with banding and APC   GASTRIC VARICES BANDING N/A 08/24/2021   Procedure: GASTRIC VARICES BANDING;  Surgeon: Genell Ken, MD;  Location: [facility] ENDOSCOPY;  Service: Gastroenterology;  Laterality: N/A;   GASTRIC VARICES BANDING N/A 08/13/2022   Procedure: GASTRIC VARICES BANDING;  Surgeon: Genell Ken, MD;  Location: [facility] ENDOSCOPY;  Service: Gastroenterology;  Laterality: N/A;   HEMOSTASIS CLIP PLACEMENT  08/21/2020   Procedure: HEMOSTASIS CLIP PLACEMENT;  Surgeon: Genell Ken, MD;  Location: [facility] ENDOSCOPY;  Service: Gastroenterology;;   HOT HEMOSTASIS N/A 10/21/2023   Procedure: HOT HEMOSTASIS (ARGON PLASMA COAGULATION/BICAP);  Surgeon: Genell Ken, MD;  Location: [facility] ENDOSCOPY;  Service: Gastroenterology;  Laterality: N/A;   INTERCOSTAL NERVE BLOCK Left 07/01/2022   Procedure: INTERCOSTAL NERVE BLOCK;  Surgeon: Zelphia Higashi, MD;  Location: [facility] OR;  Service: Thoracic;   Laterality: Left;   IR GENERIC HISTORICAL  05/15/2016   IR RADIOLOGIST EVAL & MGMT 05/15/2016 Myrlene Asper, DO [facility] INTERV RAD   IR GENERIC HISTORICAL  01/14/2017   IR US  GUIDE VASC ACCESS RIGHT 01/14/2017 Lucinda Saber, MD [facility]   IR GENERIC HISTORICAL  01/14/2017   IR VENOGRAM HEPATIC W HEMODYNAMIC EVALUATION 01/14/2017 Lucinda Saber, MD [facility]   IR RADIOLOGIST EVAL & MGMT  06/12/2017   IR RADIOLOGIST EVAL & MGMT  08/11/2018   IR RADIOLOGIST EVAL & MGMT  08/25/2019   IR RADIOLOGIST EVAL & MGMT  08/22/2020   IR RADIOLOGIST EVAL & MGMT  03/11/2022   MASTECTOMY Right 06/02/12   right breast with lymph node removal   NODE DISSECTION Left 07/01/2022   Procedure: NODE DISSECTION;  Surgeon: Zelphia Higashi, MD;  Location: [facility] OR;  Service: Thoracic;  Laterality: Left;   PORT-A-CATH REMOVAL     PORTACATH PLACEMENT  2011   left side   RADIOFREQUENCY ABLATION  06/25/2023   Procedure: RADIO FREQUENCY ABLATION;  Surgeon: Ozell Blunt, MD;  Location: [facility] ENDOSCOPY;  Service: Gastroenterology;;   RADIOLOGY WITH ANESTHESIA N/A 04/13/2016   Procedure: RADIOLOGY WITH ANESTHESIA;  Surgeon: Myrlene Asper, DO;  Location: [facility] OR;  Service: Anesthesiology;  Laterality: N/A;   VIDEO BRONCHOSCOPY WITH ENDOBRONCHIAL NAVIGATION N/A 07/01/2022  Procedure: VIDEO BRONCHOSCOPY WITH ENDOBRONCHIAL NAVIGATION;  Surgeon: Zelphia Higashi, MD;  Location: [facility] OR;  Service: Thoracic;  Laterality: N/A;   XI ROBOTIC ASSISTED THORACOSCOPY- SEGMENTECTOMY Left 07/01/2022   Procedure: XI ROBOTIC ASSISTED THORACOSCOPY-LEFT LOWER LOBE BASILAR SEGMENTECTOMY;  Surgeon: Zelphia Higashi, MD;  Location: [facility] OR;  Service: Thoracic;  Laterality: Left;    FAMILY HISTORY Family History  Problem Relation Age of Onset   COPD Mother    AAA (abdominal aortic aneurysm) Mother    COPD Father    Cancer Neg Hx   The patient's father died at the age of 67 from heart disease. Patient's mother died at the age of 59 status post CABG.  The patient had no brothers, 2 sisters. There is no history of breast or ovarian cancer in the family.   GYNECOLOGIC HISTORY: Updated May 2018 Menarche age 56, she is GX P0. She does not recall when she went through menopause. She never took hormone replacement.   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 Husband Dee Farber is retired from [employer]. They have no children and no pets. They attend a local [address].    ADVANCED DIRECTIVES: Not in place   HEALTH MAINTENANCE: Social History   Tobacco Use   Smoking status: Never   Smokeless tobacco: Never  Vaping Use   Vaping status: Never Used  Substance Use Topics   Alcohol use: No   Drug use: No     Colonoscopy: Never  PAP: Remote  Bone density: 09/27/2013, osteoporosis with a T score of -2.8  Lipid panel: Not on file    No Known Allergies   Current Outpatient Medications  Medication Sig Dispense Refill   denosumab  (PROLIA ) 60 MG/ML SOLN injection Inject 60 mg into the skin every 6 (six) months. Administer in upper arm, thigh, or abdomen     ELIQUIS  5 MG TABS tablet TAKE 1 TABLET BY MOUTH TWICE A DAY 60 tablet 2   ferrous sulfate  325 (65 FE) MG tablet Take 1 tablet (325 mg total) by mouth daily before breakfast. 90 tablet 0   fulvestrant  (FASLODEX ) 250 MG/5ML injection Inject 250-500 mg into the muscle See admin instructions. Loading dose of 500mg  on days 1,15, and 29. THEN 250mg  every 28 days thereafter     furosemide  (LASIX ) 20 MG tablet Take 20 mg by mouth daily.     Milk Thistle 1000 MG CAPS Take 1,000 mg by mouth daily.     palbociclib  (IBRANCE ) 75 MG tablet Take 1 tablet (75 mg total) by mouth daily. Take for 21 days on, 7 days off, repeat every 28 days. 21 tablet 3   pantoprazole  (PROTONIX ) 40 MG tablet Take 40 mg by mouth daily.     propranolol  (INDERAL ) 20 MG tablet Take 1 tablet (20 mg total) by mouth 2  (two) times daily. 60 tablet 0   spironolactone  (ALDACTONE ) 25 MG tablet Take 25 mg by mouth daily.     venlafaxine  XR (EFFEXOR -XR) 150 MG 24 hr capsule Take 1 capsule (150 mg total) by mouth daily with breakfast. 90 capsule 3   No current facility-administered medications for this visit.   Facility-Administered Medications Ordered in Other Visits  Medication Dose Route Frequency Provider Last Rate Last Admin   denosumab  (PROLIA ) injection 60 mg  60 mg Subcutaneous Once Magrinat, Rozella Cornfield, MD        OBJECTIVE: white woman  who appears younger than stated age  Vitals:   05/27/24 1004  BP: 119/69  Pulse: 90  Resp: 17  Temp: 97.7 F (36.5 C)  TempSrc: Temporal  SpO2: 100%  Weight: 118 lb 4.8 oz (53.7 kg)     Physical Exam Constitutional:      Appearance: Normal appearance. She is not ill-appearing.     Comments:    Cardiovascular:     Rate and Rhythm: Normal rate and regular rhythm.     Pulses: Normal pulses.     Heart sounds: Normal heart sounds.  Pulmonary:     Effort: Pulmonary effort is normal.     Breath sounds: Normal breath sounds.  Abdominal:     General: There is no distension.  Musculoskeletal:        General: No swelling or tenderness. Normal range of motion.     Cervical back: Normal range of motion. No rigidity.  Lymphadenopathy:     Cervical: No cervical adenopathy.  Skin:    General: Skin is warm and dry.  Neurological:     General: No focal deficit present.     Mental Status: She is alert.   LAB RESULTS:  CMP     Component Value Date/Time   NA 141 05/27/2024 0910   NA 142 11/17/2017 1355   K 4.1 05/27/2024 0910   K 4.4 11/17/2017 1355   CL 107 05/27/2024 0910   CL 109 (H) 04/20/2013 0859   CO2 26 05/27/2024 0910   CO2 22 11/17/2017 1355   GLUCOSE 90 05/27/2024 0910   GLUCOSE 86 11/17/2017 1355   GLUCOSE 91 04/20/2013 0859   BUN 15 05/27/2024 0910   BUN 12.5 11/17/2017 1355   CREATININE 1.24 (H) 05/27/2024 0910   CREATININE 1.1  11/17/2017 1355   CALCIUM 9.8 05/27/2024 0910   CALCIUM 9.2 11/17/2017 1355   PROT 7.2 05/27/2024 0910   PROT 7.0 11/17/2017 1355   ALBUMIN  4.4 05/27/2024 0910   ALBUMIN  3.8 11/17/2017 1355   AST 19 05/27/2024 0910   AST 23 11/17/2017 1355   ALT 16 05/27/2024 0910   ALT 22 11/17/2017 1355   ALKPHOS 62 05/27/2024 0910   ALKPHOS 55 11/17/2017 1355   BILITOT 0.5 05/27/2024 0910   BILITOT 0.49 11/17/2017 1355   GFRNONAA 47 (L) 05/27/2024 0910   GFRNONAA 53 (L) 05/09/2016 1104   GFRAA 53 (L) 06/12/2020 1146   GFRAA 55 (L) 12/13/2019 1200   GFRAA 61 05/09/2016 1104    Lab Results  Component Value Date   TOTALPROTELP 6.0 03/04/2024   ALBUMINELP 3.4 03/04/2024   A1GS 0.4 03/04/2024   A2GS 0.6 03/04/2024   BETS 1.1 03/04/2024   GAMS 0.6 03/04/2024   MSPIKE Not Observed 03/04/2024   SPEI Comment 03/04/2024    Lab Results  Component Value Date   WBC 2.5 (L) 05/27/2024   NEUTROABS 1.9 05/27/2024   HGB 12.4 05/27/2024   HCT 36.9 05/27/2024   MCV 98.4 05/27/2024   PLT 173 05/27/2024    Lab Results  Component Value Date   LABCA2 18 08/21/2012    No components found for: "ZOXWRU045"  No results for input(s): "INR" in the last 168 hours.   Lab Results  Component Value Date   LABCA2 18 08/21/2012    No results found for: "WUJ811"  No results found for: "CAN125"  No results found for: "BJY782"  Lab Results  Component Value Date   CA2729 26.9 12/13/2019    No components found for: "HGQUANT"  No results found for: "CEA1", "CEA" / No results found for: "CEA1", "CEA"   No results found for: "AFPTUMOR"  No results found for: "CHROMOGRNA"  No results found for: "KPAFRELGTCHN", "LAMBDASER", "KAPLAMBRATIO" (kappa/lambda light chains)  No results found for: "HGBA", "HGBA2QUANT", "HGBFQUANT", "HGBSQUAN" (Hemoglobinopathy evaluation)   Lab Results  Component Value Date   LDH 195 08/21/2012    Lab Results  Component Value Date   IRON  236 (H) 03/04/2024    TIBC 634 (H) 03/04/2024   IRONPCTSAT 37 (H) 03/04/2024   (Iron  and TIBC)  Lab Results  Component Value Date   FERRITIN 8 (L) 03/04/2024    Urinalysis    Component Value Date/Time   COLORURINE YELLOW 06/28/2022 1122   APPEARANCEUR CLEAR 06/28/2022 1122   LABSPEC 1.015 06/28/2022 1122   LABSPEC 1.010 04/29/2016 1118   PHURINE 6.0 06/28/2022 1122   GLUCOSEU NEGATIVE 06/28/2022 1122   GLUCOSEU Negative 04/29/2016 1118   HGBUR NEGATIVE 06/28/2022 1122   BILIRUBINUR NEGATIVE 06/28/2022 1122   BILIRUBINUR Negative 04/29/2016 1118   KETONESUR NEGATIVE 06/28/2022 1122   PROTEINUR NEGATIVE 06/28/2022 1122   UROBILINOGEN 0.2 04/29/2016 1118   NITRITE NEGATIVE 06/28/2022 1122   LEUKOCYTESUR NEGATIVE 06/28/2022 1122   LEUKOCYTESUR Trace 04/29/2016 1118   CBC from today shows significant improvement in hemoglobin.  White blood cell count is 3100, total neutrophil count is 2500, no thrombocytopenia  STUDIES: No results found.   COMPARISON: Abdominal ultrasound November 05, 2021   TECHNIQUE:  Real time limited abdomen ultrasound was performed.   IMAGE QUALITY: Satisfactory, of diagnostic value.   FINDINGS:   Limited sonographic views of the abdomen demonstrates mild abdominal ascites. There is no large pocket to safely drain.    IMPRESSION:  IMPRESSION:  1. Mild abdominal ascites.  2. No large enough pocket to safely drain.    ASSESSMENT: 68 y.o. [place] woman presenting June 2011 with stage IV breast cancer involving the right breast upper outer quadrant, right axilla, mediastinal lymph nodes, and both lungs, but not the brain, liver, or bones  (1) positive right breast and right axillary lymph node biopsies 07/17/2010 of a clinical T4 N2 M1 invasive ductal carcinoma, grade 1, estrogen receptor 100% positive, progesterone receptor 6% positive, with an MIB-1 of 12%, and no HER-2 amplification.  (2) participated in Phase II tessetaxel study, receiving 2 cycles complicated by  thrombocytopenia, anemia requiring transfusion, transaminase elevation, and afebrile neutropenia. Off-study as of September 2011. Chest CT scan September 2011 did show evidence of response.  (3) on letrozole  as of October 2011, with continuing response and good tolerance   (4) 06/02/2012 underwent right mastectomy and axillary lymph node sampling (3 lymph nodes removed, all with viable cancer as well as evidence of treatment effect) for a ypT2 ypN1-2 invasive ductal carcinoma, grade 1, 98% estrogen receptor positive, progesterone receptor negative, with no HER-2 amplification  (5) left jugular vein DVT documented 06/16/2012; left sided Port-A-Cath removed mid July 2015; Coumadin  stopped 09/07/2014.  (a) Left brachiocephalic v. slightly narrowed, with some Left lower neck collateralization noted on chest CT December 2015  (6) osteoporosis, was on alendronate , switched to denosumab /Prolia  11/07/2016  (a) bone density in 10/2018 shows osteopenia T score of -2.2  (7) portal hypertension, with splenomegaly, gastric varices, and intestinal edema  (a)  mild persistent leukopenia and thrombocytopenia likely due to splenomegaly  (b) iron  deficiency secondary to above, status post Feraheme  x2 December 2018  (8) restaging studies:  (a) chest CT 12/26/2017 shows a 4.5 mm left lower lobe  pulmonary nodule which is unchanged.  There was no other evidence of active disease  (b) restaging chest CT in 05/2019 shows no change in bilateral pulmonary nodules, prominent mediastinal lymph nodes are stable  (9) CT scan of the abdomen 11/03/2021 shows thrombus in the portal, splenic and superior mesenteric vein  (A) Heparin  started 11/03/2021  10. PET scan 05/24/2022, low metabolic activity of right lower pulmonary nodule. No change in size of CT 02/27/2022, Carcinoma with mucinous features consistent with metastatic breast carcinoma, negative margins, no LN involvement.  Prognostics showed ER +100% strong staining, PR  +20% staining, Ki-67 of 10% and HER2 2+ by IHC and negative by FISH  PLAN:  Assessment & Plan  Breast cancer Managed with Ibrance  and Faslodex . Hemoglobin normal at 12.4. Prolia  missed due to prior illness. Scan scheduled for September. - Administer Prolia  next week. - Schedule scan in September. - Continue Ibrance  and Faslodex  as prescribed.  Leukopenia due to Ibrance  Leukopenia present due to Ibrance .  No intervention needed  Mild kidney impairment Creatinine at 1.2, slightly above normal.  Follow-up - Schedule follow-up appointment in 8 weeks.  Cirrhosis Cirrhosis with monitored varices. Possible intermittent bleeding contributing to anemia. - Monitor for new symptoms.  Total time: 30 min  *Total Encounter Time as defined by the Centers for Medicare and Medicaid Services includes, in addition to the face-to-face time of a patient visit (documented in the note above) non-face-to-face time: obtaining and reviewing outside history, ordering and reviewing medications, tests or procedures, care coordination (communications with other health care professionals or caregivers) and documentation in the medical record.

## 2024-06-03 ENCOUNTER — Inpatient Hospital Stay: Attending: Hematology and Oncology

## 2024-06-03 VITALS — BP 131/81 | HR 84 | Temp 97.8°F | Resp 18

## 2024-06-03 DIAGNOSIS — C7801 Secondary malignant neoplasm of right lung: Secondary | ICD-10-CM | POA: Diagnosis not present

## 2024-06-03 DIAGNOSIS — Z5111 Encounter for antineoplastic chemotherapy: Secondary | ICD-10-CM | POA: Insufficient documentation

## 2024-06-03 DIAGNOSIS — M818 Other osteoporosis without current pathological fracture: Secondary | ICD-10-CM | POA: Insufficient documentation

## 2024-06-03 DIAGNOSIS — Z1732 Human epidermal growth factor receptor 2 negative status: Secondary | ICD-10-CM | POA: Insufficient documentation

## 2024-06-03 DIAGNOSIS — Z1721 Progesterone receptor positive status: Secondary | ICD-10-CM | POA: Diagnosis not present

## 2024-06-03 DIAGNOSIS — C50411 Malignant neoplasm of upper-outer quadrant of right female breast: Secondary | ICD-10-CM | POA: Insufficient documentation

## 2024-06-03 DIAGNOSIS — Z170 Estrogen receptor positive status [ER+]: Secondary | ICD-10-CM | POA: Insufficient documentation

## 2024-06-03 DIAGNOSIS — C778 Secondary and unspecified malignant neoplasm of lymph nodes of multiple regions: Secondary | ICD-10-CM | POA: Diagnosis not present

## 2024-06-03 DIAGNOSIS — C7802 Secondary malignant neoplasm of left lung: Secondary | ICD-10-CM | POA: Diagnosis not present

## 2024-06-03 DIAGNOSIS — M810 Age-related osteoporosis without current pathological fracture: Secondary | ICD-10-CM

## 2024-06-03 MED ORDER — DENOSUMAB 60 MG/ML ~~LOC~~ SOSY
60.0000 mg | PREFILLED_SYRINGE | Freq: Once | SUBCUTANEOUS | Status: AC
  Administered 2024-06-03: 60 mg via SUBCUTANEOUS
  Filled 2024-06-03: qty 1

## 2024-06-04 ENCOUNTER — Telehealth: Payer: Self-pay

## 2024-06-04 NOTE — Telephone Encounter (Signed)
 Pt called to ask if she should continue ferrous sulfate , as she ran out. Advised pt MD is out of office today. Also advised she can get it OTC and to continue for now, but would route this question to MD for further recommendation since labs have improved. Message forwarded to MD.

## 2024-06-07 ENCOUNTER — Encounter: Payer: Self-pay | Admitting: Hematology and Oncology

## 2024-06-07 NOTE — Telephone Encounter (Signed)
 Contacted patient with following message from Dr. Arno Bibles:  Please ask her to continue OTC ferrous sulfate  65/325 mg every other day until her next visit with us .  Nicole [name] verbalized understanding of instructions. Confirmed next appt date of July 22 for lab and to see Dr. Arno Bibles.

## 2024-06-09 ENCOUNTER — Other Ambulatory Visit: Payer: Self-pay

## 2024-06-10 ENCOUNTER — Telehealth: Payer: Self-pay

## 2024-06-10 NOTE — Telephone Encounter (Signed)
-----   Message from [place] sent at 06/10/2024  1:01 PM EDT ----- Regarding: RE: Injxn appt 6/19 Yes, Rice Chamorro, can u confirm the dates and have this appointment rescheduled. ----- Message ----- From: Mitchell, Devan, [facility] Sent: 06/10/2024  10:22 AM EDT To: Kay Parson, LPN; Murleen Arms, MD Subject: Injxn appt 6/19                                Good morning,  This pt has an injection appt scheduled for 6/19, and the appt notes say that the pt is supposed to receive Prolia . Pt just received Prolia  on 6/5, and Faslodex  isn't due until 6/26. I don't see that the pt is going on vacation and needed to receive her Faslodex  earlier. Does this appt need to rescheduled for a week later, or is she ok to receive a week early?   Thank you,  Devan

## 2024-06-10 NOTE — Telephone Encounter (Signed)
 Attempted to call pt to discuss. LVM for call back

## 2024-06-11 ENCOUNTER — Other Ambulatory Visit: Payer: Self-pay

## 2024-06-11 NOTE — Progress Notes (Signed)
 Specialty Pharmacy Refill Coordination Note  Nicole [name] is a 68 y.o. female contacted today regarding refills of specialty medication(s) Palbociclib  (IBRANCE )   Patient requested Delivery   Delivery date: 06/15/24   Verified address: [address] [place]  [id number] [place]    Medication will be filled on 06.16.25.

## 2024-06-17 ENCOUNTER — Inpatient Hospital Stay

## 2024-06-21 ENCOUNTER — Encounter: Payer: Self-pay | Admitting: Hematology and Oncology

## 2024-06-24 ENCOUNTER — Inpatient Hospital Stay

## 2024-06-25 ENCOUNTER — Inpatient Hospital Stay

## 2024-06-28 ENCOUNTER — Inpatient Hospital Stay

## 2024-06-28 VITALS — BP 102/78 | HR 82 | Temp 98.2°F | Resp 18

## 2024-06-28 DIAGNOSIS — Z5111 Encounter for antineoplastic chemotherapy: Secondary | ICD-10-CM | POA: Diagnosis not present

## 2024-06-28 DIAGNOSIS — M810 Age-related osteoporosis without current pathological fracture: Secondary | ICD-10-CM

## 2024-06-28 MED ORDER — FULVESTRANT 250 MG/5ML IM SOSY
500.0000 mg | PREFILLED_SYRINGE | Freq: Once | INTRAMUSCULAR | Status: AC
  Administered 2024-06-28: 500 mg via INTRAMUSCULAR

## 2024-06-28 NOTE — Patient Instructions (Signed)
 Fulvestrant Injection What is this medication? FULVESTRANT (ful VES trant) treats breast cancer. It works by blocking the hormone estrogen in breast tissue, which prevents breast cancer cells from spreading or growing. This medicine may be used for other purposes; ask your health care provider or pharmacist if you have questions. COMMON BRAND NAME(S): FASLODEX What should I tell my care team before I take this medication? They need to know if you have any of these conditions: Bleeding disorder Liver disease Low blood cell levels (white cells, red cells, and platelets) An unusual or allergic reaction to fulvestrant, other medications, foods, dyes, or preservatives Pregnant or trying to get pregnant Breastfeeding How should I use this medication? This medication is injected into a muscle. It is given by your care team in a hospital or clinic setting. Talk to your care team about the use of this medication in children. Special care may be needed. Overdosage: If you think you have taken too much of this medicine contact a poison control center or emergency room at once. NOTE: This medicine is only for you. Do not share this medicine with others. What if I miss a dose? Keep appointments for follow-up doses. It is important not to miss your dose. Call your care team if you are unable to keep an appointment. What may interact with this medication? Fluoroestradiol F18 This list may not describe all possible interactions. Give your health care provider a list of all the medicines, herbs, non-prescription drugs, or dietary supplements you use. Also tell them if you smoke, drink alcohol, or use illegal drugs. Some items may interact with your medicine. What should I watch for while using this medication? Your condition will be monitored carefully while you are receiving this medication. You may need blood work done while you are taking this medication. This medication is injected into a muscle. Talk  to your care team if you also take medications that prevent or treat blood clots, such as warfarin. Blood thinners may increase the risk of bleeding or bruising in the muscle where this medication is injected. The benefits of this medication may outweigh the risks. Your care team can help you find the option that works for you. They can also help limit the risk of bleeding. Talk to your care team if you may be pregnant. Serious birth defects can occur if you take this medication during pregnancy and for 1 year after the last dose. You will need a negative pregnancy test before starting this medication. Contraception is recommended while taking this medication and for 1 year after the last dose. Your care team can help you find the option that works for you. Do not breastfeed while taking this medication and for 1 year after the last dose. This medication may cause infertility. Talk to your care team if you are concerned about your fertility. What side effects may I notice from receiving this medication? Side effects that you should report to your care team as soon as possible: Allergic reactions or angioedema--skin rash, itching or hives, swelling of the face, eyes, lips, tongue, arms, or legs, trouble swallowing or breathing Pain, tingling, or numbness in the hands or feet Side effects that usually do not require medical attention (report to your care team if they continue or are bothersome): Bone, joint, or muscle pain Constipation Headache Hot flashes Nausea Pain, redness, or irritation at injection site Unusual weakness or fatigue This list may not describe all possible side effects. Call your doctor for medical advice about side  effects. You may report side effects to FDA at 1-800-FDA-1088. Where should I keep my medication? This medication is given in a hospital or clinic. It will not be stored at home. NOTE: This sheet is a summary. It may not cover all possible information. If you have  questions about this medicine, talk to your doctor, pharmacist, or health care provider.  2024 [employer] Standard (2023-08-22 00:00:00)

## 2024-07-07 ENCOUNTER — Other Ambulatory Visit: Payer: Self-pay

## 2024-07-07 NOTE — Progress Notes (Signed)
 Specialty Pharmacy Refill Coordination Note  Nicole [name] is a 68 y.o. female contacted today regarding refills of specialty medication(s) Palbociclib  (IBRANCE )   Patient requested Delivery   Delivery date: 07/12/24   Verified address: [address] [place]  [address] [place]    Medication will be filled on 07/09/24.

## 2024-07-08 ENCOUNTER — Other Ambulatory Visit: Payer: Self-pay

## 2024-07-15 ENCOUNTER — Inpatient Hospital Stay

## 2024-07-19 ENCOUNTER — Telehealth: Payer: Self-pay

## 2024-07-19 NOTE — Telephone Encounter (Signed)
 Patient comfirmed

## 2024-07-20 ENCOUNTER — Inpatient Hospital Stay: Attending: Hematology and Oncology

## 2024-07-20 ENCOUNTER — Inpatient Hospital Stay (HOSPITAL_BASED_OUTPATIENT_CLINIC_OR_DEPARTMENT_OTHER): Admitting: Hematology and Oncology

## 2024-07-20 VITALS — BP 132/62 | HR 72 | Temp 97.6°F | Resp 18 | Wt 125.7 lb

## 2024-07-20 DIAGNOSIS — Z86718 Personal history of other venous thrombosis and embolism: Secondary | ICD-10-CM | POA: Insufficient documentation

## 2024-07-20 DIAGNOSIS — J90 Pleural effusion, not elsewhere classified: Secondary | ICD-10-CM | POA: Diagnosis not present

## 2024-07-20 DIAGNOSIS — Z1732 Human epidermal growth factor receptor 2 negative status: Secondary | ICD-10-CM | POA: Insufficient documentation

## 2024-07-20 DIAGNOSIS — D649 Anemia, unspecified: Secondary | ICD-10-CM | POA: Insufficient documentation

## 2024-07-20 DIAGNOSIS — Z170 Estrogen receptor positive status [ER+]: Secondary | ICD-10-CM | POA: Insufficient documentation

## 2024-07-20 DIAGNOSIS — K7460 Unspecified cirrhosis of liver: Secondary | ICD-10-CM | POA: Insufficient documentation

## 2024-07-20 DIAGNOSIS — Z853 Personal history of malignant neoplasm of breast: Secondary | ICD-10-CM | POA: Diagnosis not present

## 2024-07-20 DIAGNOSIS — D72819 Decreased white blood cell count, unspecified: Secondary | ICD-10-CM | POA: Insufficient documentation

## 2024-07-20 DIAGNOSIS — D696 Thrombocytopenia, unspecified: Secondary | ICD-10-CM | POA: Insufficient documentation

## 2024-07-20 DIAGNOSIS — C50911 Malignant neoplasm of unspecified site of right female breast: Secondary | ICD-10-CM

## 2024-07-20 DIAGNOSIS — Z1721 Progesterone receptor positive status: Secondary | ICD-10-CM | POA: Insufficient documentation

## 2024-07-20 DIAGNOSIS — M810 Age-related osteoporosis without current pathological fracture: Secondary | ICD-10-CM

## 2024-07-20 DIAGNOSIS — Z9011 Acquired absence of right breast and nipple: Secondary | ICD-10-CM

## 2024-07-20 DIAGNOSIS — Z5111 Encounter for antineoplastic chemotherapy: Secondary | ICD-10-CM | POA: Diagnosis present

## 2024-07-20 DIAGNOSIS — C50411 Malignant neoplasm of upper-outer quadrant of right female breast: Secondary | ICD-10-CM | POA: Insufficient documentation

## 2024-07-20 DIAGNOSIS — D509 Iron deficiency anemia, unspecified: Secondary | ICD-10-CM

## 2024-07-20 DIAGNOSIS — R161 Splenomegaly, not elsewhere classified: Secondary | ICD-10-CM | POA: Diagnosis not present

## 2024-07-20 DIAGNOSIS — C799 Secondary malignant neoplasm of unspecified site: Secondary | ICD-10-CM

## 2024-07-20 DIAGNOSIS — K766 Portal hypertension: Secondary | ICD-10-CM | POA: Insufficient documentation

## 2024-07-20 DIAGNOSIS — E611 Iron deficiency: Secondary | ICD-10-CM | POA: Diagnosis not present

## 2024-07-20 DIAGNOSIS — M818 Other osteoporosis without current pathological fracture: Secondary | ICD-10-CM | POA: Insufficient documentation

## 2024-07-20 LAB — CMP (CANCER CENTER ONLY)
ALT: 14 U/L (ref 0–44)
AST: 16 U/L (ref 15–41)
Albumin: 3.7 g/dL (ref 3.5–5.0)
Alkaline Phosphatase: 53 U/L (ref 38–126)
Anion gap: 6 (ref 5–15)
BUN: 15 mg/dL (ref 8–23)
CO2: 24 mmol/L (ref 22–32)
Calcium: 8.2 mg/dL — ABNORMAL LOW (ref 8.9–10.3)
Chloride: 110 mmol/L (ref 98–111)
Creatinine: 1.18 mg/dL — ABNORMAL HIGH (ref 0.44–1.00)
GFR, Estimated: 50 mL/min — ABNORMAL LOW (ref >60)
Glucose, Bld: 82 mg/dL (ref 70–99)
Potassium: 3.8 mmol/L (ref 3.5–5.1)
Sodium: 140 mmol/L (ref 135–145)
Total Bilirubin: 0.6 mg/dL (ref 0.0–1.2)
Total Protein: 6.4 g/dL — ABNORMAL LOW (ref 6.5–8.1)

## 2024-07-20 LAB — CBC WITH DIFFERENTIAL/PLATELET
Abs Immature Granulocytes: 0.03 K/uL (ref 0.00–0.07)
Basophils Absolute: 0.1 K/uL (ref 0.0–0.1)
Basophils Relative: 2 %
Eosinophils Absolute: 0.1 K/uL (ref 0.0–0.5)
Eosinophils Relative: 4 %
HCT: 29.8 % — ABNORMAL LOW (ref 36.0–46.0)
Hemoglobin: 10.1 g/dL — ABNORMAL LOW (ref 12.0–15.0)
Immature Granulocytes: 1 %
Lymphocytes Relative: 9 %
Lymphs Abs: 0.3 K/uL — ABNORMAL LOW (ref 0.7–4.0)
MCH: 37 pg — ABNORMAL HIGH (ref 26.0–34.0)
MCHC: 33.9 g/dL (ref 30.0–36.0)
MCV: 109.2 fL — ABNORMAL HIGH (ref 80.0–100.0)
Monocytes Absolute: 0.1 K/uL (ref 0.1–1.0)
Monocytes Relative: 5 %
Neutro Abs: 2.2 K/uL (ref 1.7–7.7)
Neutrophils Relative %: 79 %
Platelets: 146 K/uL — ABNORMAL LOW (ref 150–400)
RBC: 2.73 MIL/uL — ABNORMAL LOW (ref 3.87–5.11)
RDW: 18.1 % — ABNORMAL HIGH (ref 11.5–15.5)
WBC: 2.8 K/uL — ABNORMAL LOW (ref 4.0–10.5)
nRBC: 0 % (ref 0.0–0.2)

## 2024-07-20 NOTE — Progress Notes (Signed)
 [facility]  [phone] Fax:(336) [phone]    ID: BREONIA [name]   DOB: 1956/02/02  MR#: [id number]  RDW#:[id number]  Patient Care Team: [facility] [place] as PCP - General (Family Medicine) Hays Morrison, CRNP as Nurse Practitioner (Nurse Practitioner) Ebbie Cough, MD as Consulting Physician (General Surgery) Saintclair Jasper, MD as Consulting Physician (Gastroenterology) Loretha Ash, MD as Consulting Physician (Hematology and Oncology)  CHIEF COMPLAINT:  Metastatic Breast Cancer (s/p right mastectomy)  CURRENT TREATMENT: Ibrance  and Faslodex , prolia  every 6 months.  INTERVAL HISTORY:  Discussed the use of AI scribe software for clinical note transcription with the patient, who gave verbal consent to proceed. History of Present Illness  She is currently undergoing treatment with Ibrance  and Faslodex . Nicole [name] is a 68 year old female who presents for routine follow-up.  She experiences shortness of breath and lightheadedness when rushing around, which she attributes to being 'lazy'. No new symptoms, bowel changes, blood in stool, black stool, or blood in urine. She recalls having only one urinary tract infection in her life, associated with catheter use.  Her current medications include Ibrance , Faslodex  injections, Prolia  every six months, She confirms adherence to these medications.  Her last scan was at the end of March, and she will be scheduled for another scan in September. Previous scans showed no active cancer and stable conditions, including a right pleural effusion likely transudative from cirrhosis.    COVID 19 VACCINATION STATUS: Status post Pfizer x2+ booster August 2021   BREAST CANCER HISTORY: From Dr. Maude Rubin's 07/25/2010 note:  This woman has not had any significant medical intervention for some time.  Her last mammogram was about seven years ago.  She noted a right breast mass about a year ago  and did not seek immediate medical attention for this.  She ultimately, after some time, admitted this to her husband and self-referred herself for intervention.  She had a mammogram on 07/17/2010 with an ultrasound of the right breast.  This showed a lobulated mass upper right outer quadrant measuring at least 6.3 x 7.3 cm and large right axillary lymph nodes were also noted.  There was skin thickening overlying the mass.  On physical exam, this mass was about an 8 cm with skin dimpling noted. Discolored area in the skin over the mass, fullness in the axilla.  Biopsy was recommended, which took place on 07/17/2010.  Pathology showed invasive ductal cancer involving both lymph node and breast.  The HER-2 was not amplified.  ER and PR both positive at 100% and 6% respectively.  Proliferative index was 12% involving both the lymph node and breast mass.  An MRI scan of both breasts was performed on 07/22/2010, essentially which showed a large heterogeneously enhancing mass of the right breast with washout kinetics measuring 6.4 x 4.4 x 5.0 cm.  Numerous satellite nodules were seen throughout the dominant mass.  There were several suspicious nodules in the upper central and upper inner quadrant of the right breast.  There were two enhancing subcentimeter nodules seen in the right pectoralis muscle.  No obvious chest wall nodules were seen; however, there was seen some metastatic adenopathy in the mediastinum as well as hila with a 2.4 x 2.0 cm mediastinal lymph node as well as 1.2 x 3.0 cm subcarinal lymph node.  There was a right hilar and probable left hilar adenopathy.  Bilateral pulmonary nodules were seen and a right T2 lesion was seen anterior right liver as  well.  Her subsequent history is as detailed below.   PAST MEDICAL HISTORY: Past Medical History:  Diagnosis Date   Anemia    low iron    Arthritis    fingers   Blood transfusion 2012   Breast CA (HCC)    breast ca dx 06/2010, stage 4, right ,  Lung Metasis.Surgery, Chemo, Radiation   Breast cancer (HCC)    Clot    jugular  ?2015   Depression    Esophageal varices (HCC)    Headache    Mirgraine- rare since menopause   Heart murmur    mild, no cardiologist, from birth   Hematemesis 08/2017   History of kidney stones    no pain- shows in CT- small 1 mm bilaterally- kidney   Hypertension    IBD (inflammatory bowel disease)    resolved   Idiopathic cirrhosis (HCC)    pt denies   Peripheral vascular disease (HCC) few yrs ago   clot in right jugular   Portal hypertension (HCC)    Radiation 07/21/12-09/07/12   5940 cGy   Restless legs     PAST SURGICAL HISTORY: Past Surgical History:  Procedure Laterality Date   BIOPSY  08/21/2020   Procedure: BIOPSY;  Surgeon: Saintclair Jasper, MD;  Location: [facility];  Service: Gastroenterology;;   COLONOSCOPY  2016   DILATION AND CURETTAGE OF UTERUS  2004?   ESOPHAGEAL BANDING  09/04/2017   Procedure: ESOPHAGEAL BANDING;  Surgeon: Saintclair Jasper, MD;  Location: [facility] ENDOSCOPY;  Service: Gastroenterology;;   ESOPHAGEAL BANDING  11/27/2017   Procedure: ESOPHAGEAL BANDING;  Surgeon: Saintclair Jasper, MD;  Location: [facility] ENDOSCOPY;  Service: Gastroenterology;;   ESOPHAGEAL BANDING N/A 04/28/2018   Procedure: ESOPHAGEAL BANDING;  Surgeon: Saintclair Jasper, MD;  Location: [facility];  Service: Gastroenterology;  Laterality: N/A;   ESOPHAGEAL BANDING  11/17/2018   Procedure: ESOPHAGEAL BANDING;  Surgeon: Saintclair Jasper, MD;  Location: [facility];  Service: Gastroenterology;;   ESOPHAGEAL BANDING  08/16/2019   Procedure: ESOPHAGEAL BANDING;  Surgeon: Saintclair Jasper, MD;  Location: [facility];  Service: Gastroenterology;;   ESOPHAGEAL BANDING N/A 08/21/2020   Procedure: ESOPHAGEAL BANDING;  Surgeon: Saintclair Jasper, MD;  Location: [facility];  Service: Gastroenterology;  Laterality: N/A;   ESOPHAGEAL BANDING  10/21/2023   Procedure: ESOPHAGEAL BANDING;  Surgeon: Saintclair Jasper, MD;  Location: [facility];  Service:  Gastroenterology;;   ESOPHAGOGASTRODUODENOSCOPY N/A 04/28/2018   Procedure: ESOPHAGOGASTRODUODENOSCOPY (EGD);  Surgeon: Saintclair Jasper, MD;  Location: [facility];  Service: Gastroenterology;  Laterality: N/A;   ESOPHAGOGASTRODUODENOSCOPY N/A 11/17/2018   Procedure: ESOPHAGOGASTRODUODENOSCOPY (EGD);  Surgeon: Saintclair Jasper, MD;  Location: [facility];  Service: Gastroenterology;  Laterality: N/A;   ESOPHAGOGASTRODUODENOSCOPY N/A 08/13/2022   Procedure: ESOPHAGOGASTRODUODENOSCOPY (EGD);  Surgeon: Saintclair Jasper, MD;  Location: [facility];  Service: Gastroenterology;  Laterality: N/A;   ESOPHAGOGASTRODUODENOSCOPY (EGD) WITH PROPOFOL  N/A 04/11/2016   Procedure: ESOPHAGOGASTRODUODENOSCOPY (EGD) WITH PROPOFOL ;  Surgeon: Oliva Boots, MD;  Location: [facility];  Service: Endoscopy;  Laterality: N/A;   ESOPHAGOGASTRODUODENOSCOPY (EGD) WITH PROPOFOL  N/A 07/08/2016   Procedure: ESOPHAGOGASTRODUODENOSCOPY (EGD) WITH PROPOFOL ;  Surgeon: Lynwood Bohr, MD;  Location: [facility] ENDOSCOPY;  Service: Endoscopy;  Laterality: N/A;   ESOPHAGOGASTRODUODENOSCOPY (EGD) WITH PROPOFOL  N/A 03/03/2017   Procedure: ESOPHAGOGASTRODUODENOSCOPY (EGD) WITH PROPOFOL ;  Surgeon: Lynwood Bohr, MD;  Location: [facility] ENDOSCOPY;  Service: Endoscopy;  Laterality: N/A;   ESOPHAGOGASTRODUODENOSCOPY (EGD) WITH PROPOFOL  N/A 09/04/2017   Procedure: ESOPHAGOGASTRODUODENOSCOPY (EGD) WITH PROPOFOL ;  Surgeon: Saintclair Jasper, MD;  Location: [facility] ENDOSCOPY;  Service: Gastroenterology;  Laterality: N/A;  ESOPHAGOGASTRODUODENOSCOPY (EGD) WITH PROPOFOL  N/A 11/27/2017   Procedure: ESOPHAGOGASTRODUODENOSCOPY (EGD);  Surgeon: Saintclair Jasper, MD;  Location: [facility] ENDOSCOPY;  Service: Gastroenterology;  Laterality: N/A;   ESOPHAGOGASTRODUODENOSCOPY (EGD) WITH PROPOFOL  N/A 08/16/2019   Procedure: ESOPHAGOGASTRODUODENOSCOPY (EGD) WITH PROPOFOL ;  Surgeon: Saintclair Jasper, MD;  [facility]: [facility];  Service: Gastroenterology;  Laterality: N/A;   ESOPHAGOGASTRODUODENOSCOPY (EGD) WITH  PROPOFOL  N/A 08/21/2020   Procedure: ESOPHAGOGASTRODUODENOSCOPY (EGD) WITH PROPOFOL ;  Surgeon: Saintclair Jasper, MD;  Location: [facility];  Service: Gastroenterology;  Laterality: N/A;   ESOPHAGOGASTRODUODENOSCOPY (EGD) WITH PROPOFOL  N/A 08/24/2021   Procedure: ESOPHAGOGASTRODUODENOSCOPY (EGD) WITH PROPOFOL ;  Surgeon: Saintclair Jasper, MD;  [facility]: [facility];  Service: Gastroenterology;  Laterality: N/A;   ESOPHAGOGASTRODUODENOSCOPY (EGD) WITH PROPOFOL  N/A 06/25/2023   Procedure: ESOPHAGOGASTRODUODENOSCOPY (EGD) WITH PROPOFOL ;  Surgeon: Rosalie Kitchens, MD;  Location: [facility];  Service: Gastroenterology;  Laterality: N/A;  W. bandging   ESOPHAGOGASTRODUODENOSCOPY (EGD) WITH PROPOFOL  N/A 10/21/2023   Procedure: ESOPHAGOGASTRODUODENOSCOPY (EGD) WITH PROPOFOL ;  Surgeon: Saintclair Jasper, MD;  Location: [facility];  Service: Gastroenterology;  Laterality: N/A;  with banding and APC   GASTRIC VARICES BANDING N/A 08/24/2021   Procedure: GASTRIC VARICES BANDING;  Surgeon: Saintclair Jasper, MD;  Location: [facility];  Service: Gastroenterology;  Laterality: N/A;   GASTRIC VARICES BANDING N/A 08/13/2022   Procedure: GASTRIC VARICES BANDING;  Surgeon: Saintclair Jasper, MD;  Location: [facility];  Service: Gastroenterology;  Laterality: N/A;   HEMOSTASIS CLIP PLACEMENT  08/21/2020   Procedure: HEMOSTASIS CLIP PLACEMENT;  Surgeon: Saintclair Jasper, MD;  Location: [facility];  Service: Gastroenterology;;   HOT HEMOSTASIS N/A 10/21/2023   Procedure: HOT HEMOSTASIS (ARGON PLASMA COAGULATION/BICAP);  Surgeon: Saintclair Jasper, MD;  Location: [facility];  Service: Gastroenterology;  Laterality: N/A;   INTERCOSTAL NERVE BLOCK Left 07/01/2022   Procedure: INTERCOSTAL NERVE BLOCK;  Surgeon: Kerrin Elspeth BROCKS, MD;  Location: [facility];  Service: Thoracic;  Laterality: Left;   IR GENERIC HISTORICAL  05/15/2016   IR RADIOLOGIST EVAL & MGMT 05/15/2016 Ami Bellman, DO [facility] INTER[facility]   IR GENERIC HISTORICAL  01/14/2017   IR US  GUIDE VASC  ACCESS RIGHT 01/14/2017 Ozell Specking, MD [facility]   IR GENERIC HISTORICAL  01/14/2017   IR VENOGRAM HEPATIC W HEMODYNAMIC EVALUATION 01/14/2017 Ozell Specking, MD [facility]   IR RADIOLOGIST EVAL & MGMT  06/12/2017   IR RADIOLOGIST EVAL & MGMT  08/11/2018   IR RADIOLOGIST EVAL & MGMT  08/25/2019   IR RADIOLOGIST EVAL & MGMT  08/22/2020   IR RADIOLOGIST EVAL & MGMT  03/11/2022   MASTECTOMY Right 06/02/12   right breast with lymph node removal   NODE DISSECTION Left 07/01/2022   Procedure: NODE DISSECTION;  Surgeon: Kerrin Elspeth BROCKS, MD;  Location: [facility] OR;  Service: Thoracic;  Laterality: Left;   PORT-A-CATH REMOVAL     PORTACATH PLACEMENT  2011   left side   RADIOFREQUENCY ABLATION  06/25/2023   Procedure: RADIO FREQUENCY ABLATION;  Surgeon: Rosalie Kitchens, MD;  Location: [facility];  Service: Gastroenterology;;   RADIOLOGY WITH ANESTHESIA N/A 04/13/2016   Procedure: RADIOLOGY WITH ANESTHESIA;  Surgeon: Ami Bellman, DO;  [facility];  Service: Anesthesiology;  Laterality: N/A;   VIDEO BRONCHOSCOPY WITH ENDOBRONCHIAL NAVIGATION N/A 07/01/2022   Procedure: VIDEO BRONCHOSCOPY WITH ENDOBRONCHIAL NAVIGATION;  Surgeon: Kerrin Elspeth BROCKS, MD;  Location: [facility];  Service: Thoracic;  Laterality: N/A;   XI ROBOTIC ASSISTED THORACOSCOPY- SEGMENTECTOMY Left 07/01/2022   Procedure: XI ROBOTIC ASSISTED THORACOSCOPY-LEFT LOWER LOBE BASILAR SEGMENTECTOMY;  Surgeon: Kerrin Elspeth BROCKS, MD;  Location: [facility];  Service: Thoracic;  Laterality: Left;    FAMILY HISTORY Family History  Problem Relation Age of Onset   COPD Mother    AAA (abdominal aortic aneurysm) Mother    COPD Father    Cancer Neg Hx   The patient's father died at the age of 44 from heart disease. Patient's mother died at the age of 24 status post CABG. The patient had no brothers, 2 sisters. There is no history of breast or ovarian cancer in the family.   GYNECOLOGIC HISTORY: Updated May 2018 Menarche age 57, she is GX P0. She does  not recall when she went through menopause. She never took hormone replacement.   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 Husband Chyrl is retired from [employer]. They have no children and no pets. They attend a local [address].    ADVANCED DIRECTIVES: Not in place   HEALTH MAINTENANCE: Social History   Tobacco Use   Smoking status: Never   Smokeless tobacco: Never  Vaping Use   Vaping status: Never Used  Substance Use Topics   Alcohol use: No   Drug use: No     Colonoscopy: Never  PAP: Remote  Bone density: 09/27/2013, osteoporosis with a T score of -2.8  Lipid panel: Not on file    No Known Allergies   Current Outpatient Medications  Medication Sig Dispense Refill   denosumab  (PROLIA ) 60 MG/ML SOLN injection Inject 60 mg into the skin every 6 (six) months. Administer in upper arm, thigh, or abdomen     ELIQUIS  5 MG TABS tablet TAKE 1 TABLET BY MOUTH TWICE A DAY 60 tablet 2   fulvestrant  (FASLODEX ) 250 MG/5ML injection Inject 250-500 mg into the muscle See admin instructions. Loading dose of 500mg  on days 1,15, and 29. THEN 250mg  every 28 days thereafter     furosemide  (LASIX ) 20 MG tablet Take 20 mg by mouth daily.     Milk Thistle 1000 MG CAPS Take 1,000 mg by mouth daily.     palbociclib  (IBRANCE ) 75 MG tablet Take 1 tablet (75 mg total) by mouth daily. Take for 21 days on, 7 days off, repeat every 28 days. 21 tablet 3   pantoprazole  (PROTONIX ) 40 MG tablet Take 40 mg by mouth daily.     propranolol  (INDERAL ) 20 MG tablet Take 1 tablet (20 mg total) by mouth 2 (two) times daily. 60 tablet 0   venlafaxine  XR (EFFEXOR -XR) 150 MG 24 hr capsule Take 1 capsule (150 mg total) by mouth daily with breakfast. 90 capsule 3   ferrous sulfate  325 (65 FE) MG tablet Take 1 tablet (325 mg total) by mouth daily before breakfast. (Patient not taking: Reported on 07/20/2024)  90 tablet 0   spironolactone  (ALDACTONE ) 25 MG tablet Take 25 mg by mouth daily. (Patient not taking: Reported on 07/20/2024)     No current facility-administered medications for this visit.   Facility-Administered Medications Ordered in Other Visits  Medication Dose Route Frequency Provider Last Rate Last Admin   denosumab  (PROLIA ) injection 60 mg  60 mg Subcutaneous Once Magrinat, Sandria BROCKS, MD        OBJECTIVE: white woman who appears younger than stated age  Vitals:   07/20/24 1037  BP: 132/62  Pulse: 72  Resp: 18  Temp: 97.6 F (36.4 C)  TempSrc: Temporal  SpO2: 100%  Weight: 125 lb  11.2 oz (57 kg)     Physical Exam Constitutional:      Appearance: Normal appearance. She is not ill-appearing.     Comments:    Cardiovascular:     Rate and Rhythm: Normal rate and regular rhythm.     Pulses: Normal pulses.     Heart sounds: Normal heart sounds.  Pulmonary:     Effort: Pulmonary effort is normal.     Breath sounds: Normal breath sounds.  Abdominal:     General: There is no distension.  Musculoskeletal:        General: No swelling or tenderness. Normal range of motion.     Cervical back: Normal range of motion. No rigidity.  Lymphadenopathy:     Cervical: No cervical adenopathy.  Skin:    General: Skin is warm and dry.  Neurological:     General: No focal deficit present.     Mental Status: She is alert.    LAB RESULTS:  CMP     Component Value Date/Time   NA 140 07/20/2024 1002   NA 142 11/17/2017 1355   K 3.8 07/20/2024 1002   K 4.4 11/17/2017 1355   CL 110 07/20/2024 1002   CL 109 (H) 04/20/2013 0859   CO2 24 07/20/2024 1002   CO2 22 11/17/2017 1355   GLUCOSE 82 07/20/2024 1002   GLUCOSE 86 11/17/2017 1355   GLUCOSE 91 04/20/2013 0859   BUN 15 07/20/2024 1002   BUN 12.5 11/17/2017 1355   CREATININE 1.18 (H) 07/20/2024 1002   CREATININE 1.1 11/17/2017 1355   CALCIUM 8.2 (L) 07/20/2024 1002   CALCIUM 9.2 11/17/2017 1355   PROT 6.4 (L) 07/20/2024  1002   PROT 7.0 11/17/2017 1355   ALBUMIN  3.7 07/20/2024 1002   ALBUMIN  3.8 11/17/2017 1355   AST 16 07/20/2024 1002   AST 23 11/17/2017 1355   ALT 14 07/20/2024 1002   ALT 22 11/17/2017 1355   ALKPHOS 53 07/20/2024 1002   ALKPHOS 55 11/17/2017 1355   BILITOT 0.6 07/20/2024 1002   BILITOT 0.49 11/17/2017 1355   GFRNONAA 50 (L) 07/20/2024 1002   GFRNONAA 53 (L) 05/09/2016 1104   GFRAA 53 (L) 06/12/2020 1146   GFRAA 55 (L) 12/13/2019 1200   GFRAA 61 05/09/2016 1104    Lab Results  Component Value Date   TOTALPROTELP 6.0 03/04/2024   ALBUMINELP 3.4 03/04/2024   A1GS 0.4 03/04/2024   A2GS 0.6 03/04/2024   BETS 1.1 03/04/2024   GAMS 0.6 03/04/2024   MSPIKE Not Observed 03/04/2024   SPEI Comment 03/04/2024    Lab Results  Component Value Date   WBC 2.8 (L) 07/20/2024   NEUTROABS 2.2 07/20/2024   HGB 10.1 (L) 07/20/2024   HCT 29.8 (L) 07/20/2024   MCV 109.2 (H) 07/20/2024   PLT 146 (L) 07/20/2024    Lab Results  Component Value Date   LABCA2 18 08/21/2012    No components found for: [id number]  No results for input(s): INR in the last 168 hours.   Lab Results  Component Value Date   LABCA2 18 08/21/2012    No results found for: RJW800  No results found for: CAN125  No results found for: RJW846  Lab Results  Component Value Date   CA2729 26.9 12/13/2019    No components found for: HGQUANT  No results found for: CEA1, CEA / No results found for: CEA1, CEA   No results found for: AFPTUMOR  No results found for: CHROMOGRNA  No results found  for: KPAFRELGTCHN, LAMBDASER, KAPLAMBRATIO (kappa/lambda light chains)  No results found for: HGBA, HGBA2QUANT, HGBFQUANT, HGBSQUAN (Hemoglobinopathy evaluation)   Lab Results  Component Value Date   LDH 195 08/21/2012    Lab Results  Component Value Date   IRON  236 (H) 03/04/2024   TIBC 634 (H) 03/04/2024   IRONPCTSAT 37 (H) 03/04/2024   (Iron  and TIBC)  Lab  Results  Component Value Date   FERRITIN 8 (L) 03/04/2024    Urinalysis    Component Value Date/Time   COLORURINE YELLOW 06/28/2022 1122   APPEARANCEUR CLEAR 06/28/2022 1122   LABSPEC 1.015 06/28/2022 1122   LABSPEC 1.010 04/29/2016 1118   PHURINE 6.0 06/28/2022 1122   GLUCOSEU NEGATIVE 06/28/2022 1122   GLUCOSEU Negative 04/29/2016 1118   HGBUR NEGATIVE 06/28/2022 1122   BILIRUBINUR NEGATIVE 06/28/2022 1122   BILIRUBINUR Negative 04/29/2016 1118   KETONESUR NEGATIVE 06/28/2022 1122   PROTEINUR NEGATIVE 06/28/2022 1122   UROBILINOGEN 0.2 04/29/2016 1118   NITRITE NEGATIVE 06/28/2022 1122   LEUKOCYTESUR NEGATIVE 06/28/2022 1122   LEUKOCYTESUR Trace 04/29/2016 1118   CBC from today shows significant improvement in hemoglobin.  White blood cell count is 3100, total neutrophil count is 2500, no thrombocytopenia  STUDIES: No results found.   COMPARISON: Abdominal ultrasound November 05, 2021   TECHNIQUE:  Real time limited abdomen ultrasound was performed.   IMAGE QUALITY: Satisfactory, of diagnostic value.   FINDINGS:   Limited sonographic views of the abdomen demonstrates mild abdominal ascites. There is no large pocket to safely drain.    IMPRESSION:  IMPRESSION:  1. Mild abdominal ascites.  2. No large enough pocket to safely drain.    ASSESSMENT: 68 y.o. [place] woman presenting June 2011 with stage IV breast cancer involving the right breast upper outer quadrant, right axilla, mediastinal lymph nodes, and both lungs, but not the brain, liver, or bones  (1) positive right breast and right axillary lymph node biopsies 07/17/2010 of a clinical T4 N2 M1 invasive ductal carcinoma, grade 1, estrogen receptor 100% positive, progesterone receptor 6% positive, with an MIB-1 of 12%, and no HER-2 amplification.  (2) participated in Phase II tessetaxel study, receiving 2 cycles complicated by thrombocytopenia, anemia requiring transfusion, transaminase elevation, and  afebrile neutropenia. Off-study as of September 2011. Chest CT scan September 2011 did show evidence of response.  (3) on letrozole  as of October 2011, with continuing response and good tolerance   (4) 06/02/2012 underwent right mastectomy and axillary lymph node sampling (3 lymph nodes removed, all with viable cancer as well as evidence of treatment effect) for a ypT2 ypN1-2 invasive ductal carcinoma, grade 1, 98% estrogen receptor positive, progesterone receptor negative, with no HER-2 amplification  (5) left jugular vein DVT documented 06/16/2012; left sided Port-A-Cath removed mid July 2015; Coumadin  stopped 09/07/2014.  (a) Left brachiocephalic v. slightly narrowed, with some Left lower neck collateralization noted on chest CT December 2015  (6) osteoporosis, was on alendronate , switched to denosumab /Prolia  11/07/2016  (a) bone density in 10/2018 shows osteopenia T score of -2.2  (7) portal hypertension, with splenomegaly, gastric varices, and intestinal edema  (a)  mild persistent leukopenia and thrombocytopenia likely due to splenomegaly  (b) iron  deficiency secondary to above, status post Feraheme  x2 December 2018  (8) restaging studies:  (a) chest CT 12/26/2017 shows a 4.5 mm left lower lobe pulmonary nodule which is unchanged.  There was no other evidence of active disease  (b) restaging chest CT in 05/2019 shows no change in bilateral pulmonary nodules, prominent mediastinal  lymph nodes are stable  (9) CT scan of the abdomen 11/03/2021 shows thrombus in the portal, splenic and superior mesenteric vein  (A) Heparin  started 11/03/2021  10. PET scan 05/24/2022, low metabolic activity of right lower pulmonary nodule. No change in size of CT 02/27/2022, Carcinoma with mucinous features consistent with metastatic breast carcinoma, negative margins, no LN involvement.  Prognostics showed ER +100% strong staining, PR +20% staining, Ki-67 of 10% and HER2 2+ by IHC and negative by  FISH  PLAN:  Assessment & Plan  Assessment & Plan Metastatic breast cancer, no evidence of recurrence. - Continue Ibrance . - Administer Faslodex  monthly. - PET scan will be scheduled for September.  Mild anemia Hemoglobin 10.1, likely Ibrance  side effect. - Monitor hemoglobin levels. - Advise rest if dyspnea or lightheadedness.  Right pleural effusion Likely related to cirrhosis rather than cancer.  Cirrhosis Contributing to right pleural effusion.     Breast cancer Well-managed, no active disease on recent scans. - Continue Ibrance . - Administer Faslodex  monthly. - PET scan scheduled for September.  Mild anemia Hemoglobin 10.1, likely Ibrance  side effect. - Monitor hemoglobin levels. - Advise rest if dyspnea or lightheadedness.  Right pleural effusion Likely related to cirrhosis rather than cancer.  Cirrhosis Contributing to right pleural effusion.  Cirrhosis Cirrhosis with monitored varices. Possible intermittent bleeding contributing to anemia.  Total time: 30 min  *Total Encounter Time as defined by the Centers for Medicare and Medicaid Services includes, in addition to the face-to-face time of a patient visit (documented in the note above) non-face-to-face time: obtaining and reviewing outside history, ordering and reviewing medications, tests or procedures, care coordination (communications with other health care professionals or caregivers) and documentation in the medical record.

## 2024-07-24 ENCOUNTER — Other Ambulatory Visit: Payer: Self-pay | Admitting: Hematology and Oncology

## 2024-07-26 ENCOUNTER — Inpatient Hospital Stay

## 2024-07-26 ENCOUNTER — Encounter: Payer: Self-pay | Admitting: Hematology and Oncology

## 2024-07-26 ENCOUNTER — Other Ambulatory Visit: Payer: Self-pay | Admitting: *Deleted

## 2024-07-26 VITALS — BP 123/82 | HR 22 | Temp 98.5°F | Resp 19 | Ht 64.0 in

## 2024-07-26 DIAGNOSIS — Z5111 Encounter for antineoplastic chemotherapy: Secondary | ICD-10-CM | POA: Diagnosis not present

## 2024-07-26 DIAGNOSIS — M810 Age-related osteoporosis without current pathological fracture: Secondary | ICD-10-CM

## 2024-07-26 MED ORDER — FULVESTRANT 250 MG/5ML IM SOSY
500.0000 mg | PREFILLED_SYRINGE | Freq: Once | INTRAMUSCULAR | Status: AC
Start: 2024-07-26 — End: 2024-07-26
  Administered 2024-07-26: 500 mg via INTRAMUSCULAR
  Filled 2024-07-26: qty 10

## 2024-07-26 MED ORDER — APIXABAN 5 MG PO TABS: 5.0000 mg | ORAL_TABLET | Freq: Two times a day (BID) | ORAL | 2 refills | Status: DC

## 2024-07-29 ENCOUNTER — Encounter (INDEPENDENT_AMBULATORY_CARE_PROVIDER_SITE_OTHER): Payer: Self-pay

## 2024-07-30 ENCOUNTER — Other Ambulatory Visit: Payer: Self-pay | Admitting: Hematology and Oncology

## 2024-07-30 ENCOUNTER — Other Ambulatory Visit: Payer: Self-pay

## 2024-07-30 DIAGNOSIS — Z170 Estrogen receptor positive status [ER+]: Secondary | ICD-10-CM

## 2024-07-30 MED ORDER — PALBOCICLIB 75 MG PO TABS
75.0000 mg | ORAL_TABLET | Freq: Every day | ORAL | 3 refills | Status: DC
  Filled 2024-07-30: qty 21, 28d supply, fill #0
  Filled 2024-08-26: qty 21, 28d supply, fill #1
  Filled 2024-09-22: qty 21, 28d supply, fill #2
  Filled 2024-10-15: qty 21, 28d supply, fill #3

## 2024-07-30 NOTE — Progress Notes (Signed)
 Specialty Pharmacy Refill Coordination Note  Nicole [name] is a 68 y.o. female contacted today regarding refills of specialty medication(s) Palbociclib  (IBRANCE )   Patient requested (Patient-Rptd) Delivery   Delivery date: 08/04/24   Verified address: (Patient-Rptd) [address]   [place]  [address]   Medication will be filled on 08/03/24, pending refill approval.

## 2024-08-12 ENCOUNTER — Inpatient Hospital Stay

## 2024-08-19 ENCOUNTER — Inpatient Hospital Stay

## 2024-08-21 ENCOUNTER — Other Ambulatory Visit: Payer: Self-pay | Admitting: Hematology and Oncology

## 2024-08-21 DIAGNOSIS — C50411 Malignant neoplasm of upper-outer quadrant of right female breast: Secondary | ICD-10-CM

## 2024-08-23 ENCOUNTER — Inpatient Hospital Stay: Attending: Hematology and Oncology

## 2024-08-23 ENCOUNTER — Encounter: Payer: Self-pay | Admitting: Hematology and Oncology

## 2024-08-23 VITALS — BP 121/79 | HR 83 | Temp 97.7°F | Resp 18

## 2024-08-23 DIAGNOSIS — Z170 Estrogen receptor positive status [ER+]: Secondary | ICD-10-CM | POA: Insufficient documentation

## 2024-08-23 DIAGNOSIS — M810 Age-related osteoporosis without current pathological fracture: Secondary | ICD-10-CM

## 2024-08-23 DIAGNOSIS — Z5111 Encounter for antineoplastic chemotherapy: Secondary | ICD-10-CM | POA: Diagnosis present

## 2024-08-23 DIAGNOSIS — C7801 Secondary malignant neoplasm of right lung: Secondary | ICD-10-CM | POA: Diagnosis not present

## 2024-08-23 DIAGNOSIS — C778 Secondary and unspecified malignant neoplasm of lymph nodes of multiple regions: Secondary | ICD-10-CM | POA: Diagnosis not present

## 2024-08-23 DIAGNOSIS — Z1721 Progesterone receptor positive status: Secondary | ICD-10-CM | POA: Insufficient documentation

## 2024-08-23 DIAGNOSIS — C7802 Secondary malignant neoplasm of left lung: Secondary | ICD-10-CM | POA: Insufficient documentation

## 2024-08-23 DIAGNOSIS — C50411 Malignant neoplasm of upper-outer quadrant of right female breast: Secondary | ICD-10-CM | POA: Insufficient documentation

## 2024-08-23 MED ORDER — FULVESTRANT 250 MG/5ML IM SOSY
500.0000 mg | PREFILLED_SYRINGE | Freq: Once | INTRAMUSCULAR | Status: AC
  Administered 2024-08-23: 500 mg via INTRAMUSCULAR
  Filled 2024-08-23: qty 10

## 2024-08-26 ENCOUNTER — Other Ambulatory Visit: Payer: Self-pay

## 2024-08-26 ENCOUNTER — Encounter (INDEPENDENT_AMBULATORY_CARE_PROVIDER_SITE_OTHER): Payer: Self-pay

## 2024-08-26 NOTE — Progress Notes (Signed)
 Specialty Pharmacy Refill Coordination Note  Nicole [name] is a 68 y.o. female contacted today regarding refills of specialty medication(s) Palbociclib  (IBRANCE )   Patient requested (Patient-Rptd) Delivery   Delivery date: 09/02/24   Verified address: [address]  [place], [place]  [address]   Medication will be filled on 09/01/24.

## 2024-09-12 ENCOUNTER — Encounter: Payer: Self-pay | Admitting: Hematology and Oncology

## 2024-09-13 ENCOUNTER — Other Ambulatory Visit: Payer: Self-pay | Admitting: *Deleted

## 2024-09-13 DIAGNOSIS — D509 Iron deficiency anemia, unspecified: Secondary | ICD-10-CM

## 2024-09-13 DIAGNOSIS — C50411 Malignant neoplasm of upper-outer quadrant of right female breast: Secondary | ICD-10-CM

## 2024-09-16 ENCOUNTER — Encounter (HOSPITAL_COMMUNITY)
Admission: RE | Admit: 2024-09-16 | Discharge: 2024-09-16 | Disposition: A | Discharge status: Home / Self Care | Source: Ambulatory Visit | Attending: Hematology and Oncology | Admitting: Hematology and Oncology

## 2024-09-16 ENCOUNTER — Ambulatory Visit

## 2024-09-16 DIAGNOSIS — J90 Pleural effusion, not elsewhere classified: Secondary | ICD-10-CM | POA: Insufficient documentation

## 2024-09-16 DIAGNOSIS — C7800 Secondary malignant neoplasm of unspecified lung: Secondary | ICD-10-CM | POA: Insufficient documentation

## 2024-09-16 DIAGNOSIS — R188 Other ascites: Secondary | ICD-10-CM | POA: Diagnosis not present

## 2024-09-16 DIAGNOSIS — C50911 Malignant neoplasm of unspecified site of right female breast: Secondary | ICD-10-CM | POA: Diagnosis present

## 2024-09-16 DIAGNOSIS — I700 Atherosclerosis of aorta: Secondary | ICD-10-CM | POA: Diagnosis not present

## 2024-09-16 DIAGNOSIS — K7460 Unspecified cirrhosis of liver: Secondary | ICD-10-CM | POA: Insufficient documentation

## 2024-09-16 LAB — GLUCOSE, CAPILLARY: Glucose-Capillary: 99 mg/dL (ref 70–99)

## 2024-09-16 MED ORDER — FLUDEOXYGLUCOSE F - 18 (FDG) INJECTION
6.0100 | Freq: Once | INTRAVENOUS | Status: AC
  Administered 2024-09-16: 6.01 via INTRAVENOUS

## 2024-09-20 ENCOUNTER — Other Ambulatory Visit: Payer: Self-pay | Admitting: *Deleted

## 2024-09-20 ENCOUNTER — Inpatient Hospital Stay

## 2024-09-20 ENCOUNTER — Inpatient Hospital Stay: Attending: Hematology and Oncology | Admitting: Hematology and Oncology

## 2024-09-20 ENCOUNTER — Ambulatory Visit

## 2024-09-20 VITALS — BP 117/61 | HR 81 | Temp 97.9°F | Resp 18 | Wt 126.3 lb

## 2024-09-20 DIAGNOSIS — R911 Solitary pulmonary nodule: Secondary | ICD-10-CM | POA: Insufficient documentation

## 2024-09-20 DIAGNOSIS — D649 Anemia, unspecified: Secondary | ICD-10-CM | POA: Diagnosis not present

## 2024-09-20 DIAGNOSIS — R5383 Other fatigue: Secondary | ICD-10-CM | POA: Insufficient documentation

## 2024-09-20 DIAGNOSIS — Z86718 Personal history of other venous thrombosis and embolism: Secondary | ICD-10-CM | POA: Diagnosis not present

## 2024-09-20 DIAGNOSIS — Z5111 Encounter for antineoplastic chemotherapy: Secondary | ICD-10-CM | POA: Diagnosis present

## 2024-09-20 DIAGNOSIS — I864 Gastric varices: Secondary | ICD-10-CM | POA: Diagnosis not present

## 2024-09-20 DIAGNOSIS — J90 Pleural effusion, not elsewhere classified: Secondary | ICD-10-CM | POA: Diagnosis not present

## 2024-09-20 DIAGNOSIS — M810 Age-related osteoporosis without current pathological fracture: Secondary | ICD-10-CM | POA: Insufficient documentation

## 2024-09-20 DIAGNOSIS — K7460 Unspecified cirrhosis of liver: Secondary | ICD-10-CM | POA: Diagnosis not present

## 2024-09-20 DIAGNOSIS — R161 Splenomegaly, not elsewhere classified: Secondary | ICD-10-CM | POA: Diagnosis not present

## 2024-09-20 DIAGNOSIS — D509 Iron deficiency anemia, unspecified: Secondary | ICD-10-CM

## 2024-09-20 DIAGNOSIS — Z853 Personal history of malignant neoplasm of breast: Secondary | ICD-10-CM | POA: Diagnosis not present

## 2024-09-20 DIAGNOSIS — R918 Other nonspecific abnormal finding of lung field: Secondary | ICD-10-CM | POA: Diagnosis not present

## 2024-09-20 DIAGNOSIS — Z1721 Progesterone receptor positive status: Secondary | ICD-10-CM | POA: Diagnosis not present

## 2024-09-20 DIAGNOSIS — K766 Portal hypertension: Secondary | ICD-10-CM | POA: Insufficient documentation

## 2024-09-20 DIAGNOSIS — Z170 Estrogen receptor positive status [ER+]: Secondary | ICD-10-CM | POA: Diagnosis not present

## 2024-09-20 DIAGNOSIS — C50411 Malignant neoplasm of upper-outer quadrant of right female breast: Secondary | ICD-10-CM | POA: Insufficient documentation

## 2024-09-20 DIAGNOSIS — E611 Iron deficiency: Secondary | ICD-10-CM | POA: Diagnosis not present

## 2024-09-20 DIAGNOSIS — M8580 Other specified disorders of bone density and structure, unspecified site: Secondary | ICD-10-CM

## 2024-09-20 DIAGNOSIS — Z1732 Human epidermal growth factor receptor 2 negative status: Secondary | ICD-10-CM | POA: Insufficient documentation

## 2024-09-20 LAB — CBC WITH DIFFERENTIAL (CANCER CENTER ONLY)
Abs Immature Granulocytes: 0.02 K/uL (ref 0.00–0.07)
Basophils Absolute: 0 K/uL (ref 0.0–0.1)
Basophils Relative: 1 %
Eosinophils Absolute: 0.1 K/uL (ref 0.0–0.5)
Eosinophils Relative: 3 %
HCT: 28.1 % — ABNORMAL LOW (ref 36.0–46.0)
Hemoglobin: 9.2 g/dL — ABNORMAL LOW (ref 12.0–15.0)
Immature Granulocytes: 1 %
Lymphocytes Relative: 8 %
Lymphs Abs: 0.2 K/uL — ABNORMAL LOW (ref 0.7–4.0)
MCH: 38.7 pg — ABNORMAL HIGH (ref 26.0–34.0)
MCHC: 32.7 g/dL (ref 30.0–36.0)
MCV: 118.1 fL — ABNORMAL HIGH (ref 80.0–100.0)
Monocytes Absolute: 0.2 K/uL (ref 0.1–1.0)
Monocytes Relative: 7 %
Neutro Abs: 2.4 K/uL (ref 1.7–7.7)
Neutrophils Relative %: 80 %
Platelet Count: 152 K/uL (ref 150–400)
RBC: 2.38 MIL/uL — ABNORMAL LOW (ref 3.87–5.11)
RDW: 17 % — ABNORMAL HIGH (ref 11.5–15.5)
WBC Count: 2.9 K/uL — ABNORMAL LOW (ref 4.0–10.5)
nRBC: 0 % (ref 0.0–0.2)

## 2024-09-20 LAB — SAMPLE TO BLOOD BANK

## 2024-09-20 LAB — CMP (CANCER CENTER ONLY)
ALT: 13 U/L (ref 0–44)
AST: 17 U/L (ref 15–41)
Albumin: 4.1 g/dL (ref 3.5–5.0)
Alkaline Phosphatase: 53 U/L (ref 38–126)
Anion gap: 7 (ref 5–15)
BUN: 17 mg/dL (ref 8–23)
CO2: 23 mmol/L (ref 22–32)
Calcium: 8.9 mg/dL (ref 8.9–10.3)
Chloride: 107 mmol/L (ref 98–111)
Creatinine: 1.32 mg/dL — ABNORMAL HIGH (ref 0.44–1.00)
GFR, Estimated: 44 mL/min — ABNORMAL LOW (ref >60)
Glucose, Bld: 91 mg/dL (ref 70–99)
Potassium: 4.3 mmol/L (ref 3.5–5.1)
Sodium: 137 mmol/L (ref 135–145)
Total Bilirubin: 0.7 mg/dL (ref 0.0–1.2)
Total Protein: 6.8 g/dL (ref 6.5–8.1)

## 2024-09-20 LAB — IRON AND IRON BINDING CAPACITY (CC-WL,HP ONLY)
Iron: 69 ug/dL (ref 28–170)
Saturation Ratios: 14 % (ref 10.4–31.8)
TIBC: 489 ug/dL — ABNORMAL HIGH (ref 250–450)
UIBC: 420 ug/dL (ref 148–442)

## 2024-09-20 LAB — FERRITIN: Ferritin: 60 ng/mL (ref 11–307)

## 2024-09-20 LAB — FOLATE: Folate: >20 ng/mL (ref >5.9)

## 2024-09-20 LAB — VITAMIN B12: Vitamin B-12: 1299 pg/mL — ABNORMAL HIGH (ref 180–914)

## 2024-09-20 MED ORDER — FULVESTRANT 250 MG/5ML IM SOSY
500.0000 mg | PREFILLED_SYRINGE | Freq: Once | INTRAMUSCULAR | Status: AC
  Administered 2024-09-20: 500 mg via INTRAMUSCULAR
  Filled 2024-09-20: qty 10

## 2024-09-20 NOTE — Progress Notes (Signed)
 [facility]  [phone] [phone]    ID: [name]   DOB: 03/03/1956  MR#: [id number]  RDW#:[id number]  Patient Care Team: [facility] [place] as PCP - General (Family Medicine) Hays Morrison, CRNP as Nurse Practitioner (Nurse Practitioner) Ebbie Cough, MD as Consulting Physician (General Surgery) Saintclair Jasper, MD as Consulting Physician (Gastroenterology) Loretha Ash, MD as Consulting Physician (Hematology and Oncology)  CHIEF COMPLAINT:  Metastatic Breast Cancer (s/p right mastectomy)  CURRENT TREATMENT: Ibrance  and Faslodex , prolia  every 6 months.  INTERVAL HISTORY:  Discussed the use of AI scribe software for clinical note transcription with the patient, who gave verbal consent to proceed. History of Present Illness  Nicole [name] is currently undergoing treatment with Ibrance  and Faslodex . Nicole [name] is a 68 year old female who presents for routine follow-up.     COVID 19 VACCINATION STATUS: Status post Pfizer x2+ booster August 2021   BREAST CANCER HISTORY: From Dr. Maude Rubin's 07/25/2010 note:  This woman has not had any significant medical intervention for some time.  Her last mammogram was about seven years ago.  Nicole [name] noted a right breast mass about a year ago and did not seek immediate medical attention for this.  Nicole [name] ultimately, after some time, admitted this to her husband and self-referred herself for intervention.  Nicole [name] had a mammogram on 07/17/2010 with an ultrasound of the right breast.  This showed a lobulated mass upper right outer quadrant measuring at least 6.3 x 7.3 cm and large right axillary lymph nodes were also noted.  There was skin thickening overlying the mass.  On physical exam, this mass was about an 8 cm with skin dimpling noted. Discolored area in the skin over the mass, fullness in the axilla.  Biopsy was recommended, which took place on 07/17/2010.  Pathology showed invasive ductal  cancer involving both lymph node and breast.  The HER-2 was not amplified.  ER and PR both positive at 100% and 6% respectively.  Proliferative index was 12% involving both the lymph node and breast mass.  An MRI scan of both breasts was performed on 07/22/2010, essentially which showed a large heterogeneously enhancing mass of the right breast with washout kinetics measuring 6.4 x 4.4 x 5.0 cm.  Numerous satellite nodules were seen throughout the dominant mass.  There were several suspicious nodules in the upper central and upper inner quadrant of the right breast.  There were two enhancing subcentimeter nodules seen in the right pectoralis muscle.  No obvious chest wall nodules were seen; however, there was seen some metastatic adenopathy in the mediastinum as well as hila with a 2.4 x 2.0 cm mediastinal lymph node as well as 1.2 x 3.0 cm subcarinal lymph node.  There was a right hilar and probable left hilar adenopathy.  Bilateral pulmonary nodules were seen and a right T2 lesion was seen anterior right liver as well.  Her subsequent history is as detailed below.   PAST MEDICAL HISTORY: Past Medical History:  Diagnosis Date   Anemia    low iron    Arthritis    fingers   Blood transfusion 2012   Breast CA (HCC)    breast ca dx 06/2010, stage 4, right , Lung Metasis.Surgery, Chemo, Radiation   Breast cancer (HCC)    Clot    jugular  ?2015   Depression    Esophageal varices (HCC)    Headache    Mirgraine- rare since menopause   Heart murmur  mild, no cardiologist, from birth   Hematemesis 08/2017   History of kidney stones    no pain- shows in CT- small 1 mm bilaterally- kidney   Hypertension    IBD (inflammatory bowel disease)    resolved   Idiopathic cirrhosis (HCC)    pt denies   Peripheral vascular disease (HCC) few yrs ago   clot in right jugular   Portal hypertension (HCC)    Radiation 07/21/12-09/07/12   5940 cGy   Restless legs     PAST SURGICAL HISTORY: Past Surgical  History:  Procedure Laterality Date   BIOPSY  08/21/2020   Procedure: BIOPSY;  Surgeon: Saintclair Jasper, MD;  Location: [facility] ENDOSCOPY;  Service: Gastroenterology;;   COLONOSCOPY  2016   DILATION AND CURETTAGE OF UTERUS  2004?   ESOPHAGEAL BANDING  09/04/2017   Procedure: ESOPHAGEAL BANDING;  Surgeon: Saintclair Jasper, MD;  [facility] ENDOSCOPY;  Service: Gastroenterology;;   ESOPHAGEAL BANDING  11/27/2017   Procedure: ESOPHAGEAL BANDING;  Surgeon: Saintclair Jasper, MD;  Location: [facility] ENDOSCOPY;  Service: Gastroenterology;;   ESOPHAGEAL BANDING N/A 04/28/2018   Procedure: ESOPHAGEAL BANDING;  Surgeon: Saintclair Jasper, MD;  Location: [facility] ENDOSCOPY;  Service: Gastroenterology;  Laterality: N/A;   ESOPHAGEAL BANDING  11/17/2018   Procedure: ESOPHAGEAL BANDING;  Surgeon: Saintclair Jasper, MD;  Location: [facility]COPY;  Service: Gastroenterology;;   ESOPHAGEAL BANDING  08/16/2019   Procedure: ESOPHAGEAL BANDING;  Surgeon: Saintclair Jasper, MD;  Location: [facility]COPY;  Service: Gastroenterology;;   ESOPHAGEAL BANDING N/A 08/21/2020   Procedure: ESOPHAGEAL BANDING;  Surgeon: Saintclair Jasper, MD;  Location: [facility]OSCOPY;  Service: Gastroenterology;  Laterality: N/A;   ESOPHAGEAL BANDING  10/21/2023   Procedure: ESOPHAGEAL BANDING;  Surgeon: Saintclair Jasper, MD;  Location: [facility] ENDOSCOPY;  Service: Gastroenterology;;   ESOPHAGOGASTRODUODENOSCOPY N/A 04/28/2018   Procedure: ESOPHAGOGASTRODUODENOSCOPY (EGD);  Surgeon: Saintclair Jasper, MD;  Location: [facility]COPY;  Service: Gastroenterology;  Laterality: N/A;   ESOPHAGOGASTRODUODENOSCOPY N/A 11/17/2018   Procedure: ESOPHAGOGASTRODUODENOSCOPY (EGD);  Surgeon: Saintclair Jasper, MD;  Location: [facility];  Service: Gastroenterology;  Laterality: N/A;   ESOPHAGOGASTRODUODENOSCOPY N/A 08/13/2022   Procedure: ESOPHAGOGASTRODUODENOSCOPY (EGD);  Surgeon: Saintclair Jasper, MD;  Location: [facility];  Service: Gastroenterology;  Laterality: N/A;   ESOPHAGOGASTRODUODENOSCOPY (EGD) WITH PROPOFOL  N/A 04/11/2016    Procedure: ESOPHAGOGASTRODUODENOSCOPY (EGD) WITH PROPOFOL ;  Surgeon: Oliva Boots, MD;  Location: [facility] ENDOSCOPY;  Service: Endoscopy;  Laterality: N/A;   ESOPHAGOGASTRODUODENOSCOPY (EGD) WITH PROPOFOL  N/A 07/08/2016   Procedure: ESOPHAGOGASTRODUODENOSCOPY (EGD) WITH PROPOFOL ;  Surgeon: Lynwood Bohr, MD;  Location: [facility] ENDOSCOPY;  Service: Endoscopy;  Laterality: N/A;   ESOPHAGOGASTRODUODENOSCOPY (EGD) WITH PROPOFOL  N/A 03/03/2017   Procedure: ESOPHAGOGASTRODUODENOSCOPY (EGD) WITH PROPOFOL ;  Surgeon: Lynwood Bohr, MD;  Location: [facility] ENDOSCOPY;  Service: Endoscopy;  Laterality: N/A;   ESOPHAGOGASTRODUODENOSCOPY (EGD) WITH PROPOFOL  N/A 09/04/2017   Procedure: ESOPHAGOGASTRODUODENOSCOPY (EGD) WITH PROPOFOL ;  Surgeon: Saintclair Jasper, MD;  Location: [facility] ENDOSCOPY;  Service: Gastroenterology;  Laterality: N/A;   ESOPHAGOGASTRODUODENOSCOPY (EGD) WITH PROPOFOL  N/A 11/27/2017   Procedure: ESOPHAGOGASTRODUODENOSCOPY (EGD);  Surgeon: Saintclair Jasper, MD;  Location: [facility] ENDOSCOPY;  Service: Gastroenterology;  Laterality: N/A;   ESOPHAGOGASTRODUODENOSCOPY (EGD) WITH PROPOFOL  N/A 08/16/2019   Procedure: ESOPHAGOGASTRODUODENOSCOPY (EGD) WITH PROPOFOL ;  Surgeon: Saintclair Jasper, MD;  Location: [facility] ENDOSCOPY;  Service: Gastroenterology;  Laterality: N/A;   ESOPHAGOGASTRODUODENOSCOPY (EGD) WITH PROPOFOL  N/A 08/21/2020   Procedure: ESOPHAGOGASTRODUODENOSCOPY (EGD) WITH PROPOFOL ;  Surgeon: Saintclair Jasper, MD;  Location: [facility]OSCOPY;  Service: Gastroenterology;  Laterality: N/A;   ESOPHAGOGASTRODUODENOSCOPY (EGD) WITH PROPOFOL  N/A 08/24/2021   Procedure: ESOPHAGOGASTRODUODENOSCOPY (EGD) WITH  PROPOFOL ;  Surgeon: Saintclair Jasper, MD;  Location: [facility];  Service: Gastroenterology;  Laterality: N/A;   ESOPHAGOGASTRODUODENOSCOPY (EGD) WITH PROPOFOL  N/A 06/25/2023   Procedure: ESOPHAGOGASTRODUODENOSCOPY (EGD) WITH PROPOFOL ;  Surgeon: Rosalie Kitchens, MD;  Location: [facility] ENDOSCOPY;  Service: Gastroenterology;  Laterality: N/A;  W. bandging    ESOPHAGOGASTRODUODENOSCOPY (EGD) WITH PROPOFOL  N/A 10/21/2023   Procedure: ESOPHAGOGASTRODUODENOSCOPY (EGD) WITH PROPOFOL ;  Surgeon: Saintclair Jasper, MD;  Location: [facility] ENDOSCOPY;  Service: Gastroenterology;  Laterality: N/A;  with banding and APC   GASTRIC VARICES BANDING N/A 08/24/2021   Procedure: GASTRIC VARICES BANDING;  Surgeon: Saintclair Jasper, MD;  Location: [facility] ENDOSCOPY;  Service: Gastroenterology;  Laterality: N/A;   GASTRIC VARICES BANDING N/A 08/13/2022   Procedure: GASTRIC VARICES BANDING;  Surgeon: Saintclair Jasper, MD;  Location: [facility] ENDOSCOPY;  Service: Gastroenterology;  Laterality: N/A;   HEMOSTASIS CLIP PLACEMENT  08/21/2020   Procedure: HEMOSTASIS CLIP PLACEMENT;  Surgeon: Saintclair Jasper, MD;  Location: [facility] ENDOSCOPY;  Service: Gastroenterology;;   HOT HEMOSTASIS N/A 10/21/2023   Procedure: HOT HEMOSTASIS (ARGON PLASMA COAGULATION/BICAP);  Surgeon: Saintclair Jasper, MD;  Location: [facility];  Service: Gastroenterology;  Laterality: N/A;   INTERCOSTAL NERVE BLOCK Left 07/01/2022   Procedure: INTERCOSTAL NERVE BLOCK;  Surgeon: Kerrin Elspeth BROCKS, MD;  Location: [facility] OR;  Service: Thoracic;  Laterality: Left;   IR GENERIC HISTORICAL  05/15/2016   IR RADIOLOGIST EVAL & MGMT 05/15/2016 Ami Bellman, DO [facility] INTERV RAD   IR GENERIC HISTORICAL  01/14/2017   IR US  GUIDE VASC ACCESS RIGHT 01/14/2017 Ozell Specking, MD [facility]   IR GENERIC HISTORICAL  01/14/2017   IR VENOGRAM HEPATIC W HEMODYNAMIC EVALUATION 01/14/2017 Ozell Specking, MD [facility]   IR RADIOLOGIST EVAL & MGMT  06/12/2017   IR RADIOLOGIST EVAL & MGMT  08/11/2018   IR RADIOLOGIST EVAL & MGMT  08/25/2019   IR RADIOLOGIST EVAL & MGMT  08/22/2020   IR RADIOLOGIST EVAL & MGMT  03/11/2022   MASTECTOMY Right 06/02/12   right breast with lymph node removal   NODE DISSECTION Left 07/01/2022   Procedure: NODE DISSECTION;  Surgeon: Kerrin Elspeth BROCKS, MD;  Location: [facility] OR;  Service: Thoracic;  Laterality: Left;   PORT-A-CATH REMOVAL     PORTACATH  PLACEMENT  2011   left side   RADIOFREQUENCY ABLATION  06/25/2023   Procedure: RADIO FREQUENCY ABLATION;  Surgeon: Rosalie Kitchens, MD;  Location: [facility];  Service: Gastroenterology;;   RADIOLOGY WITH ANESTHESIA N/A 04/13/2016   Procedure: RADIOLOGY WITH ANESTHESIA;  Surgeon: Ami Bellman, DO;  Location: [facility] OR;  Service: Anesthesiology;  Laterality: N/A;   VIDEO BRONCHOSCOPY WITH ENDOBRONCHIAL NAVIGATION N/A 07/01/2022   Procedure: VIDEO BRONCHOSCOPY WITH ENDOBRONCHIAL NAVIGATION;  Surgeon: Kerrin Elspeth BROCKS, MD;  Location: [facility] OR;  Service: Thoracic;  Laterality: N/A;   XI ROBOTIC ASSISTED THORACOSCOPY- SEGMENTECTOMY Left 07/01/2022   Procedure: XI ROBOTIC ASSISTED THORACOSCOPY-LEFT LOWER LOBE BASILAR SEGMENTECTOMY;  Surgeon: Kerrin Elspeth BROCKS, MD;  Location: [facility] OR;  Service: Thoracic;  Laterality: Left;    FAMILY HISTORY Family History  Problem Relation Age of Onset   COPD Mother    AAA (abdominal aortic aneurysm) Mother    COPD Father    Cancer Neg Hx   The patient's father died at the age of 76 from heart disease. Patient's mother died at the age of 69 status post CABG. The patient had no brothers, 2 sisters. There is no history of breast or ovarian cancer in the family.   GYNECOLOGIC HISTORY: Updated May 2018 Menarche age 45,  Nicole [name] is GX P0. Nicole [name] does not recall when Nicole [name] went through menopause. Nicole [name] never took hormone replacement.   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 Nicole [name] retired in November 2017. Husband Chyrl is retired from [employer]. They have no children and no pets. They attend a local [address].    ADVANCED DIRECTIVES: Not in place   HEALTH MAINTENANCE: Social History   Tobacco Use   Smoking status: Never   Smokeless tobacco: Never  Vaping Use   Vaping status: Never Used  Substance Use Topics   Alcohol use: No   Drug use: No     Colonoscopy: Never  PAP: Remote  Bone  density: 09/27/2013, osteoporosis with a T score of -2.8  Lipid panel: Not on file    No Known Allergies   Current Outpatient Medications  Medication Sig Dispense Refill   apixaban  (ELIQUIS ) 5 MG TABS tablet Take 1 tablet (5 mg total) by mouth 2 (two) times daily. 60 tablet 2   denosumab  (PROLIA ) 60 MG/ML SOLN injection Inject 60 mg into the skin every 6 (six) months. Administer in upper arm, thigh, or abdomen     fulvestrant  (FASLODEX ) 250 MG/5ML injection Inject 250-500 mg into the muscle See admin instructions. Loading dose of 500mg  on days 1,15, and 29. THEN 250mg  every 28 days thereafter     furosemide  (LASIX ) 20 MG tablet Take 20 mg by mouth daily.     Milk Thistle 1000 MG CAPS Take 1,000 mg by mouth daily.     palbociclib  (IBRANCE ) 75 MG tablet Take 1 tablet (75 mg total) by mouth daily. Take for 21 days on, 7 days off, repeat every 28 days. 21 tablet 3   pantoprazole  (PROTONIX ) 40 MG tablet Take 40 mg by mouth daily.     propranolol  (INDERAL ) 20 MG tablet Take 1 tablet (20 mg total) by mouth 2 (two) times daily. 60 tablet 0   venlafaxine  XR (EFFEXOR -XR) 150 MG 24 hr capsule TAKE 1 CAPSULE BY MOUTH DAILY WITH BREAKFAST. 90 capsule 3   ferrous sulfate  325 (65 FE) MG tablet Take 1 tablet (325 mg total) by mouth daily before breakfast. (Patient not taking: Reported on 09/20/2024) 90 tablet 0   spironolactone  (ALDACTONE ) 25 MG tablet Take 25 mg by mouth daily. (Patient not taking: Reported on 09/20/2024)     No current facility-administered medications for this visit.   Facility-Administered Medications Ordered in Other Visits  Medication Dose Route Frequency Provider Last Rate Last Admin   denosumab  (PROLIA ) injection 60 mg  60 mg Subcutaneous Once Magrinat, Sandria BROCKS, MD        OBJECTIVE: white woman who appears younger than stated age  Vitals:   09/20/24 1157  BP: 117/61  Pulse: 81  Resp: 18  Temp: 97.9 F (36.6 C)  TempSrc: Temporal  SpO2: 100%  Weight: 126 lb 4.8 oz (57.3  kg)     Physical Exam Constitutional:      Appearance: Normal appearance. Nicole [name] is not ill-appearing.     Comments:    Cardiovascular:     Rate and Rhythm: Normal rate and regular rhythm.     Pulses: Normal pulses.     Heart sounds: Normal heart sounds.  Pulmonary:     Effort: Pulmonary effort is normal.     Breath sounds: Normal breath sounds.  Abdominal:     General: There is no distension.  Musculoskeletal:  General: No swelling or tenderness. Normal range of motion.     Cervical back: Normal range of motion. No rigidity.  Lymphadenopathy:     Cervical: No cervical adenopathy.  Skin:    General: Skin is warm and dry.  Neurological:     General: No focal deficit present.     Mental Status: Nicole [name] is alert.    LAB RESULTS:  CMP     Component Value Date/Time   NA 140 07/20/2024 1002   NA 142 11/17/2017 1355   K 3.8 07/20/2024 1002   K 4.4 11/17/2017 1355   CL 110 07/20/2024 1002   CL 109 (H) 04/20/2013 0859   CO2 24 07/20/2024 1002   CO2 22 11/17/2017 1355   GLUCOSE 82 07/20/2024 1002   GLUCOSE 86 11/17/2017 1355   GLUCOSE 91 04/20/2013 0859   BUN 15 07/20/2024 1002   BUN 12.5 11/17/2017 1355   CREATININE 1.18 (H) 07/20/2024 1002   CREATININE 1.1 11/17/2017 1355   CALCIUM 8.2 (L) 07/20/2024 1002   CALCIUM 9.2 11/17/2017 1355   PROT 6.4 (L) 07/20/2024 1002   PROT 7.0 11/17/2017 1355   ALBUMIN  3.7 07/20/2024 1002   ALBUMIN  3.8 11/17/2017 1355   AST 16 07/20/2024 1002   AST 23 11/17/2017 1355   ALT 14 07/20/2024 1002   ALT 22 11/17/2017 1355   ALKPHOS 53 07/20/2024 1002   ALKPHOS 55 11/17/2017 1355   BILITOT 0.6 07/20/2024 1002   BILITOT 0.49 11/17/2017 1355   GFRNONAA 50 (L) 07/20/2024 1002   GFRNONAA 53 (L) 05/09/2016 1104   GFRAA 53 (L) 06/12/2020 1146   GFRAA 55 (L) 12/13/2019 1200   GFRAA 61 05/09/2016 1104    Lab Results  Component Value Date   TOTALPROTELP 6.0 03/04/2024   ALBUMINELP 3.4 03/04/2024   A1GS 0.4 03/04/2024   A2GS 0.6  03/04/2024   BETS 1.1 03/04/2024   GAMS 0.6 03/04/2024   MSPIKE Not Observed 03/04/2024   SPEI Comment 03/04/2024    Lab Results  Component Value Date   WBC 2.9 (L) 09/20/2024   NEUTROABS 2.4 09/20/2024   HGB 9.2 (L) 09/20/2024   HCT 28.1 (L) 09/20/2024   MCV 118.1 (H) 09/20/2024   PLT 152 09/20/2024    Lab Results  Component Value Date   LABCA2 18 08/21/2012    No components found for: [id number]  No results for input(s): INR in the last 168 hours.   Lab Results  Component Value Date   LABCA2 18 08/21/2012    No results found for: CAN199  No results found for: CAN125  No results found for: RJW846  Lab Results  Component Value Date   CA2729 26.9 12/13/2019    No components found for: HGQUANT  No results found for: CEA1, CEA / No results found for: CEA1, CEA   No results found for: AFPTUMOR  No results found for: CHROMOGRNA  No results found for: KPAFRELGTCHN, LAMBDASER, KAPLAMBRATIO (kappa/lambda light chains)  No results found for: HGBA, HGBA2QUANT, HGBFQUANT, HGBSQUAN (Hemoglobinopathy evaluation)   Lab Results  Component Value Date   LDH 195 08/21/2012    Lab Results  Component Value Date   IRON  236 (H) 03/04/2024   TIBC 634 (H) 03/04/2024   IRONPCTSAT 37 (H) 03/04/2024   (Iron  and TIBC)  Lab Results  Component Value Date   FERRITIN 8 (L) 03/04/2024    Urinalysis    Component Value Date/Time   COLORURINE YELLOW 06/28/2022 1122   APPEARANCEUR CLEAR 06/28/2022 1122   LABSPEC  1.015 06/28/2022 1122   LABSPEC 1.010 04/29/2016 1118   PHURINE 6.0 06/28/2022 1122   GLUCOSEU NEGATIVE 06/28/2022 1122   GLUCOSEU Negative 04/29/2016 1118   HGBUR NEGATIVE 06/28/2022 1122   BILIRUBINUR NEGATIVE 06/28/2022 1122   BILIRUBINUR Negative 04/29/2016 1118   KETONESUR NEGATIVE 06/28/2022 1122   PROTEINUR NEGATIVE 06/28/2022 1122   UROBILINOGEN 0.2 04/29/2016 1118   NITRITE NEGATIVE 06/28/2022 1122    LEUKOCYTESUR NEGATIVE 06/28/2022 1122   LEUKOCYTESUR Trace 04/29/2016 1118   CBC from today shows significant improvement in hemoglobin.  White blood cell count is 3100, total neutrophil count is 2500, no thrombocytopenia  STUDIES: NM PET Image Restage (PS) Skull Base to Thigh (F-18 FDG) Result Date: 09/20/2024 CLINICAL DATA:  Subsequent treatment strategy for metastatic breast cancer. EXAM: NUCLEAR MEDICINE PET SKULL BASE TO THIGH TECHNIQUE: 6.0 mCi F-18 FDG was injected intravenously. Full-ring PET imaging was performed from the skull base to thigh after the radiotracer. CT data was obtained and used for attenuation correction and anatomic localization. Fasting blood glucose: 99 mg/dl COMPARISON:  6/75/7974 FINDINGS: Mediastinal blood pool activity: SUV max 2.4 Liver activity: SUV max NA NECK: No significant abnormal hypermetabolic activity in this region. Incidental CT findings: Left common carotid atheromatous vascular calcification. CHEST: Right hilar node maximum SUV 3.3 but reduced conspicuity compared to blood pool compared to the prior exam with the lesion had a maximum SUV of 2.6. Incidental CT findings: Aortic atherosclerosis. Right mastectomy and right axillary clips noted. Small bilateral pleural effusions, reduced in size on the right compared to the prior exam. Post therapy related findings anteriorly in the right lung. Similar appearance of scattered scarring as well as mild anterior nodularity in the left upper lobe which is not currently hypermetabolic. ABDOMEN/PELVIS: New moderate ascites especially along the right side and in the pelvis, with low-grade metabolic activity within the ascites such that inflammatory or neoplastic etiologies not excluded. No well-defined soft tissue mass along the peritoneum. Scattered physiologic activity in bowel. Incidental CT findings: Hepatic cirrhosis. Left upper quadrant embolization coils unchanged. Atherosclerosis is present, including aortoiliac  atherosclerotic disease. SKELETON: No significant abnormal hypermetabolic activity in this region. Incidental CT findings: Remote compressions at L1 and L2. IMPRESSION: 1. New moderate ascites with low-grade metabolic activity within the ascites such that inflammatory or neoplastic etiologies not excluded. No well-defined soft tissue mass along the peritoneum. 2. Low-grade activity in a right hilar node roughly similar to prior when compared to background blood pool activity. 3. Small bilateral pleural effusions, reduced in size on the right compared to the prior exam. 4. Hepatic cirrhosis. 5. Remote compressions at L1 and L2. 6.  Aortic Atherosclerosis (ICD10-I70.0). Electronically Signed   By: Ryan Salvage M.D.   On: 09/20/2024 09:58     COMPARISON: Abdominal ultrasound November 05, 2021   TECHNIQUE:  Real time limited abdomen ultrasound was performed.   IMAGE QUALITY: Satisfactory, of diagnostic value.   FINDINGS:   Limited sonographic views of the abdomen demonstrates mild abdominal ascites. There is no large pocket to safely drain.    IMPRESSION:  IMPRESSION:  1. Mild abdominal ascites.  2. No large enough pocket to safely drain.    ASSESSMENT: 68 y.o. [place] woman presenting June 2011 with stage IV breast cancer involving the right breast upper outer quadrant, right axilla, mediastinal lymph nodes, and both lungs, but not the brain, liver, or bones  (1) positive right breast and right axillary lymph node biopsies 07/17/2010 of a clinical T4 N2 M1 invasive ductal carcinoma, grade  1, estrogen receptor 100% positive, progesterone receptor 6% positive, with an MIB-1 of 12%, and no HER-2 amplification.  (2) participated in Phase II tessetaxel study, receiving 2 cycles complicated by thrombocytopenia, anemia requiring transfusion, transaminase elevation, and afebrile neutropenia. Off-study as of September 2011. Chest CT scan September 2011 did show evidence of response.  (3) on  letrozole  as of October 2011, with continuing response and good tolerance   (4) 06/02/2012 underwent right mastectomy and axillary lymph node sampling (3 lymph nodes removed, all with viable cancer as well as evidence of treatment effect) for a ypT2 ypN1-2 invasive ductal carcinoma, grade 1, 98% estrogen receptor positive, progesterone receptor negative, with no HER-2 amplification  (5) left jugular vein DVT documented 06/16/2012; left sided Port-A-Cath removed mid July 2015; Coumadin  stopped 09/07/2014.  (a) Left brachiocephalic v. slightly narrowed, with some Left lower neck collateralization noted on chest CT December 2015  (6) osteoporosis, was on alendronate , switched to denosumab /Prolia  11/07/2016  (a) bone density in 10/2018 shows osteopenia T score of -2.2  (7) portal hypertension, with splenomegaly, gastric varices, and intestinal edema  (a)  mild persistent leukopenia and thrombocytopenia likely due to splenomegaly  (b) iron  deficiency secondary to above, status post Feraheme  x2 December 2018  (8) restaging studies:  (a) chest CT 12/26/2017 shows a 4.5 mm left lower lobe pulmonary nodule which is unchanged.  There was no other evidence of active disease  (b) restaging chest CT in 05/2019 shows no change in bilateral pulmonary nodules, prominent mediastinal lymph nodes are stable  (9) CT scan of the abdomen 11/03/2021 shows thrombus in the portal, splenic and superior mesenteric vein  (A) Heparin  started 11/03/2021  10. PET scan 05/24/2022, low metabolic activity of right lower pulmonary nodule. No change in size of CT 02/27/2022, Carcinoma with mucinous features consistent with metastatic breast carcinoma, negative margins, no LN involvement.  Prognostics showed ER +100% strong staining, PR +20% staining, Ki-67 of 10% and HER2 2+ by IHC and negative by FISH  PLAN:  Assessment & Plan  Assessment & Plan Metastatic breast cancer, no evidence of recurrence. - Continue Ibrance . -  Administer Faslodex  monthly. - PET scan will be scheduled for September.  Mild anemia Hemoglobin 10.1, likely Ibrance  side effect. - Monitor hemoglobin levels. - Advise rest if dyspnea or lightheadedness.  Right pleural effusion Likely related to cirrhosis rather than cancer.  Cirrhosis Contributing to right pleural effusion.     Breast cancer Well-managed, no active disease on recent scans. - Continue Ibrance . - Administer Faslodex  monthly. - PET scan scheduled for September.  Mild anemia Hemoglobin 10.1, likely Ibrance  side effect. - Monitor hemoglobin levels. - Advise rest if dyspnea or lightheadedness.  Right pleural effusion Likely related to cirrhosis rather than cancer.  Cirrhosis Contributing to right pleural effusion.  Cirrhosis Cirrhosis with monitored varices. Possible intermittent bleeding contributing to anemia.  Total time: 30 min  *Total Encounter Time as defined by the Centers for Medicare and Medicaid Services includes, in addition to the face-to-face time of a patient visit (documented in the note above) non-face-to-face time: obtaining and reviewing outside history, ordering and reviewing medications, tests or procedures, care coordination (communications with other health care professionals or caregivers) and documentation in the medical record.

## 2024-09-20 NOTE — Progress Notes (Signed)
 [facility]  [phone] [phone]    ID: MACEY WURTZ   DOB: 05-13-1956  MR#: [id number]  RDW#:[id number]  Patient Care Team: [facility] [place] as PCP - General (Family Medicine) Hays Morrison, CRNP as Nurse Practitioner (Nurse Practitioner) Ebbie Cough, MD as Consulting Physician (General Surgery) Saintclair Jasper, MD as Consulting Physician (Gastroenterology) Loretha Ash, MD as Consulting Physician (Hematology and Oncology)  CHIEF COMPLAINT:  Metastatic Breast Cancer (s/p right mastectomy)  CURRENT TREATMENT: Ibrance  and Faslodex , prolia  every 6 months.  INTERVAL HISTORY:  Discussed the use of AI scribe software for clinical note transcription with the patient, who gave verbal consent to proceed. History of Present Illness  Nicole [name] is a 68 year old female with met breast cancer,  liver disease and anemia who presents with fatigue and heavy legs.  She experiences fatigue and a sensation of heaviness in her legs, particularly after walking or engaging in physical activity. These symptoms occur with exertion. She also experiences shortness of breath, consistent with previous episodes. No lightheadedness, blood in her stool, or black stools.  She has a history of anemia, with her hemoglobin currently at 9.2 g/dL, which is lower than normal but improved from previous levels. Her red blood cells are macrocytic. She is unsure if she takes a B12 supplement.  She is currently on Ibrance , Faslodex , and Prolia  for metastatic breast cancer. Faslodex  is administered monthly and Prolia  every six months, with the last Prolia  dose in June and the next due in December. Recent scans show no new cancer, a stable small lymph node, and mild ascites.  She has cirrhosis, and notes some abdominal distension, which she attributes to fluid retention. She has never consumed alcohol, indicating a non-alcoholic cause for her liver  condition.  She mentions a recent injury to her toe, which was initially blue and is now swollen. She finds relief with hot water but notes the injury is slowly healing.    COVID 19 VACCINATION STATUS: Status post Pfizer x2+ booster August 2021   BREAST CANCER HISTORY: From Dr. Maude Rubin's 07/25/2010 note:  This woman has not had any significant medical intervention for some time.  Her last mammogram was about seven years ago.  She noted a right breast mass about a year ago and did not seek immediate medical attention for this.  She ultimately, after some time, admitted this to her husband and self-referred herself for intervention.  She had a mammogram on 07/17/2010 with an ultrasound of the right breast.  This showed a lobulated mass upper right outer quadrant measuring at least 6.3 x 7.3 cm and large right axillary lymph nodes were also noted.  There was skin thickening overlying the mass.  On physical exam, this mass was about an 8 cm with skin dimpling noted. Discolored area in the skin over the mass, fullness in the axilla.  Biopsy was recommended, which took place on 07/17/2010.  Pathology showed invasive ductal cancer involving both lymph node and breast.  The HER-2 was not amplified.  ER and PR both positive at 100% and 6% respectively.  Proliferative index was 12% involving both the lymph node and breast mass.  An MRI scan of both breasts was performed on 07/22/2010, essentially which showed a large heterogeneously enhancing mass of the right breast with washout kinetics measuring 6.4 x 4.4 x 5.0 cm.  Numerous satellite nodules were seen throughout the dominant mass.  There were several suspicious nodules in the upper central  and upper inner quadrant of the right breast.  There were two enhancing subcentimeter nodules seen in the right pectoralis muscle.  No obvious chest wall nodules were seen; however, there was seen some metastatic adenopathy in the mediastinum as well as hila with a 2.4 x  2.0 cm mediastinal lymph node as well as 1.2 x 3.0 cm subcarinal lymph node.  There was a right hilar and probable left hilar adenopathy.  Bilateral pulmonary nodules were seen and a right T2 lesion was seen anterior right liver as well.  Her subsequent history is as detailed below.   PAST MEDICAL HISTORY: Past Medical History:  Diagnosis Date   Anemia    low iron    Arthritis    fingers   Blood transfusion 2012   Breast CA (HCC)    breast ca dx 06/2010, stage 4, right , Lung Metasis.Surgery, Chemo, Radiation   Breast cancer (HCC)    Clot    jugular  ?2015   Depression    Esophageal varices (HCC)    Headache    Mirgraine- rare since menopause   Heart murmur    mild, no cardiologist, from birth   Hematemesis 08/2017   History of kidney stones    no pain- shows in CT- small 1 mm bilaterally- kidney   Hypertension    IBD (inflammatory bowel disease)    resolved   Idiopathic cirrhosis (HCC)    pt denies   Peripheral vascular disease (HCC) few yrs ago   clot in right jugular   Portal hypertension (HCC)    Radiation 07/21/12-09/07/12   5940 cGy   Restless legs     PAST SURGICAL HISTORY: Past Surgical History:  Procedure Laterality Date   BIOPSY  08/21/2020   Procedure: BIOPSY;  Surgeon: Saintclair Jasper, MD;  Location: [facility] END[facility]COPY;  Service: Gastroenterology;;   COLONOSCOPY  2016   DILATION AND CURETTAGE OF UTERUS  2004?   ESOPHAGEAL BANDING  09/04/2017   Procedure: ESOPHAGEAL BANDING;  Surgeon: Saintclair Jasper, MD;  Location: [facility] ENDOSCOPY;  Service: Gastroenterology;;   ESOPHAGEAL BANDING  11/27/2017   Procedure: ESOPHAGEAL BANDING;  Surgeon: Saintclair Jasper, MD;  Location: [facility] ENDOSCOPY;  Service: Gastroenterology;;   ESOPHAGEAL BANDING N/A 04/28/2018   Procedure: ESOPHAGEAL BANDING;  Surgeon: Saintclair Jasper, MD;  Location: [facility];  Service: Gastroenterology;  Laterality: N/A;   ESOPHAGEAL BANDING  11/17/2018   Procedure: ESOPHAGEAL BANDING;  Surgeon: Saintclair Jasper, MD;  Location: [facility];  Service: Gastroenterology;;   ESOPHAGEAL BANDING  08/16/2019   Procedure: ESOPHAGEAL BANDING;  Surgeon: Saintclair Jasper, MD;  Location: [facility];  Service: Gastroenterology;;   ESOPHAGEAL BANDING N/A 08/21/2020   Procedure: ESOPHAGEAL BANDING;  Surgeon: Saintclair Jasper, MD;  Location: [facility];  Service: Gastroenterology;  Laterality: N/A;   ESOPHAGEAL BANDING  10/21/2023   Procedure: ESOPHAGEAL BANDING;  Surgeon: Saintclair Jasper, MD;  Location: [facility];  Service: Gastroenterology;;   ESOPHAGOGASTRODUODENOSCOPY N/A 04/28/2018   Procedure: ESOPHAGOGASTRODUODENOSCOPY (EGD);  Surgeon: Saintclair Jasper, MD;  Location: [facility];  Service: Gastroenterology;  Laterality: N/A;   ESOPHAGOGASTRODUODENOSCOPY N/A 11/17/2018   Procedure: ESOPHAGOGASTRODUODENOSCOPY (EGD);  Surgeon: Saintclair Jasper, MD;  Location: [facility];  Service: Gastroenterology;  Laterality: N/A;   ESOPHAGOGASTRODUODENOSCOPY N/A 08/13/2022   Procedure: ESOPHAGOGASTRODUODENOSCOPY (EGD);  Surgeon: Saintclair Jasper, MD;  Location: [facility];  Service: Gastroenterology;  Laterality: N/A;   ESOPHAGOGASTRODUODENOSCOPY (EGD) WITH PROPOFOL  N/A 04/11/2016   Procedure: ESOPHAGOGASTRODUODENOSCOPY (EGD) WITH PROPOFOL ;  Surgeon: Oliva Boots, MD;  Location: [facility];  Service: Endoscopy;  Laterality: N/A;  ESOPHAGOGASTRODUODENOSCOPY (EGD) WITH PROPOFOL  N/A 07/08/2016   Procedure: ESOPHAGOGASTRODUODENOSCOPY (EGD) WITH PROPOFOL ;  Surgeon: Lynwood Bohr, MD;  Location: [facility] ENDOSCOPY;  Service: Endoscopy;  Laterality: N/A;   ESOPHAGOGASTRODUODENOSCOPY (EGD) WITH PROPOFOL  N/A 03/03/2017   Procedure: ESOPHAGOGASTRODUODENOSCOPY (EGD) WITH PROPOFOL ;  Surgeon: Lynwood Bohr, MD;  Location: [facility] ENDOSCOPY;  Service: Endoscopy;  Laterality: N/A;   ESOPHAGOGASTRODUODENOSCOPY (EGD) WITH PROPOFOL  N/A 09/04/2017   Procedure: ESOPHAGOGASTRODUODENOSCOPY (EGD) WITH PROPOFOL ;  Surgeon: Saintclair Jasper, MD;  Location: [facility] ENDOSCOPY;  Service: Gastroenterology;  Laterality:  N/A;   ESOPHAGOGASTRODUODENOSCOPY (EGD) WITH PROPOFOL  N/A 11/27/2017   Procedure: ESOPHAGOGASTRODUODENOSCOPY (EGD);  Surgeon: Saintclair Jasper, MD;  Location: [facility] ENDOSCOPY;  Service: Gastroenterology;  Laterality: N/A;   ESOPHAGOGASTRODUODENOSCOPY (EGD) WITH PROPOFOL  N/A 08/16/2019   Procedure: ESOPHAGOGASTRODUODENOSCOPY (EGD) WITH PROPOFOL ;  Surgeon: Saintclair Jasper, MD;  Location: [facility];  Service: Gastroenterology;  Laterality: N/A;   ESOPHAGOGASTRODUODENOSCOPY (EGD) WITH PROPOFOL  N/A 08/21/2020   Procedure: ESOPHAGOGASTRODUODENOSCOPY (EGD) WITH PROPOFOL ;  Surgeon: Saintclair Jasper, MD;  Location: [facility];  Service: Gastroenterology;  Laterality: N/A;   ESOPHAGOGASTRODUODENOSCOPY (EGD) WITH PROPOFOL  N/A 08/24/2021   Procedure: ESOPHAGOGASTRODUODENOSCOPY (EGD) WITH PROPOFOL ;  Surgeon: Saintclair Jasper, MD;  Location: [facility];  Service: Gastroenterology;  Laterality: N/A;   ESOPHAGOGASTRODUODENOSCOPY (EGD) WITH PROPOFOL  N/A 06/25/2023   Procedure: ESOPHAGOGASTRODUODENOSCOPY (EGD) WITH PROPOFOL ;  Surgeon: Rosalie Kitchens, MD;  Location: [facility];  Service: Gastroenterology;  Laterality: N/A;  W. bandging   ESOPHAGOGASTRODUODENOSCOPY (EGD) WITH PROPOFOL  N/A 10/21/2023   Procedure: ESOPHAGOGASTRODUODENOSCOPY (EGD) WITH PROPOFOL ;  Surgeon: Saintclair Jasper, MD;  Location: [facility];  Service: Gastroenterology;  Laterality: N/A;  with banding and APC   GASTRIC VARICES BANDING N/A 08/24/2021   Procedure: GASTRIC VARICES BANDING;  Surgeon: Saintclair Jasper, MD;  Location: [facility];  Service: Gastroenterology;  Laterality: N/A;   GASTRIC VARICES BANDING N/A 08/13/2022   Procedure: GASTRIC VARICES BANDING;  Surgeon: Saintclair Jasper, MD;  Location: [facility];  Service: Gastroenterology;  Laterality: N/A;   HEMOSTASIS CLIP PLACEMENT  08/21/2020   Procedure: HEMOSTASIS CLIP PLACEMENT;  Surgeon: Saintclair Jasper, MD;  Location: [facility];  Service: Gastroenterology;;   HOT HEMOSTASIS N/A 10/21/2023   Procedure: HOT  HEMOSTASIS (ARGON PLASMA COAGULATION/BICAP);  Surgeon: Saintclair Jasper, MD;  Location: [facility];  Service: Gastroenterology;  Laterality: N/A;   INTERCOSTAL NERVE BLOCK Left 07/01/2022   Procedure: INTERCOSTAL NERVE BLOCK;  Surgeon: Kerrin Elspeth BROCKS, MD;  Location: [facility] OR;  Service: Thoracic;  Laterality: Left;   IR GENERIC HISTORICAL  05/15/2016   IR RADIOLOGIST EVAL & MGMT 05/15/2016 Ami Bellman, DO [facility] INTERV RAD   IR GENERIC HISTORICAL  01/14/2017   IR US  GUIDE VASC ACCESS RIGHT 01/14/2017 Ozell Specking, MD [facility]   IR GENERIC HISTORICAL  01/14/2017   IR VENOGRAM HEPATIC W HEMODYNAMIC EVALUATION 01/14/2017 Ozell Specking, MD [facility]   IR RADIOLOGIST EVAL & MGMT  06/12/2017   IR RADIOLOGIST EVAL & MGMT  08/11/2018   IR RADIOLOGIST EVAL & MGMT  08/25/2019   IR RADIOLOGIST EVAL & MGMT  08/22/2020   IR RADIOLOGIST EVAL & MGMT  03/11/2022   MASTECTOMY Right 06/02/12   right breast with lymph node removal   NODE DISSECTION Left 07/01/2022   Procedure: NODE DISSECTION;  Surgeon: Kerrin Elspeth BROCKS, MD;  [facility] OR;  Service: Thoracic;  Laterality: Left;   PORT-A-CATH REMOVAL     PORTACATH PLACEMENT  2011   left side   RADIOFREQUENCY ABLATION  06/25/2023   Procedure: RADIO FREQUENCY ABLATION;  Surgeon: Rosalie Kitchens, MD;  Location: [facility];  Service: Gastroenterology;;   RADIOLOGY WITH ANESTHESIA N/A 04/13/2016   Procedure: RADIOLOGY WITH ANESTHESIA;  Surgeon: Ami Bellman, DO;  Location: [facility] OR;  Service: Anesthesiology;  Laterality: N/A;   VIDEO BRONCHOSCOPY WITH ENDOBRONCHIAL NAVIGATION N/A 07/01/2022   Procedure: VIDEO BRONCHOSCOPY WITH ENDOBRONCHIAL NAVIGATION;  Surgeon: Kerrin Elspeth BROCKS, MD;  Location: [facility] OR;  Service: Thoracic;  Laterality: N/A;   XI ROBOTIC ASSISTED THORACOSCOPY- SEGMENTECTOMY Left 07/01/2022   Procedure: XI ROBOTIC ASSISTED THORACOSCOPY-LEFT LOWER LOBE BASILAR SEGMENTECTOMY;  Surgeon: Kerrin Elspeth BROCKS, MD;  Location: [facility] OR;  Service: Thoracic;   Laterality: Left;    FAMILY HISTORY Family History  Problem Relation Age of Onset   COPD Mother    AAA (abdominal aortic aneurysm) Mother    COPD Father    Cancer Neg Hx   The patient's father died at the age of 62 from heart disease. Patient's mother died at the age of 68 status post CABG. The patient had no brothers, 2 sisters. There is no history of breast or ovarian cancer in the family.   GYNECOLOGIC HISTORY: Updated May 2018 Menarche age 73, she is GX P0. She does not recall when she went through menopause. She never took hormone replacement.   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 Husband Chyrl is retired from [employer]. They have no children and no pets. They attend a local [address].    ADVANCED DIRECTIVES: Not in place   HEALTH MAINTENANCE: Social History   Tobacco Use   Smoking status: Never   Smokeless tobacco: Never  Vaping Use   Vaping status: Never Used  Substance Use Topics   Alcohol use: No   Drug use: No     Colonoscopy: Never  PAP: Remote  Bone density: 09/27/2013, osteoporosis with a T score of -2.8  Lipid panel: Not on file    No Known Allergies   Current Outpatient Medications  Medication Sig Dispense Refill   apixaban  (ELIQUIS ) 5 MG TABS tablet Take 1 tablet (5 mg total) by mouth 2 (two) times daily. 60 tablet 2   denosumab  (PROLIA ) 60 MG/ML SOLN injection Inject 60 mg into the skin every 6 (six) months. Administer in upper arm, thigh, or abdomen     fulvestrant  (FASLODEX ) 250 MG/5ML injection Inject 250-500 mg into the muscle See admin instructions. Loading dose of 500mg  on days 1,15, and 29. THEN 250mg  every 28 days thereafter     furosemide  (LASIX ) 20 MG tablet Take 20 mg by mouth daily.     Milk Thistle 1000 MG CAPS Take 1,000 mg by mouth daily.     palbociclib  (IBRANCE ) 75 MG tablet Take 1 tablet (75 mg total) by mouth  daily. Take for 21 days on, 7 days off, repeat every 28 days. 21 tablet 3   pantoprazole  (PROTONIX ) 40 MG tablet Take 40 mg by mouth daily.     propranolol  (INDERAL ) 20 MG tablet Take 1 tablet (20 mg total) by mouth 2 (two) times daily. 60 tablet 0   venlafaxine  XR (EFFEXOR -XR) 150 MG 24 hr capsule TAKE 1 CAPSULE BY MOUTH DAILY WITH BREAKFAST. 90 capsule 3   ferrous sulfate  325 (65 FE) MG tablet Take 1 tablet (325 mg total) by mouth daily before breakfast. (Patient not taking: Reported on 09/20/2024) 90 tablet 0   spironolactone  (ALDACTONE ) 25 MG tablet Take 25 mg by mouth daily. (Patient not taking: Reported on 09/20/2024)  No current facility-administered medications for this visit.   Facility-Administered Medications Ordered in Other Visits  Medication Dose Route Frequency Provider Last Rate Last Admin   denosumab  (PROLIA ) injection 60 mg  60 mg Subcutaneous Once Magrinat, Sandria BROCKS, MD        OBJECTIVE: white woman who appears younger than stated age  Vitals:   09/20/24 1157  BP: 117/61  Pulse: 81  Resp: 18  Temp: 97.9 F (36.6 C)  TempSrc: Temporal  SpO2: 100%  Weight: 126 lb 4.8 oz (57.3 kg)     Physical Exam Constitutional:      Appearance: Normal appearance. She is not ill-appearing.     Comments:    Cardiovascular:     Rate and Rhythm: Normal rate and regular rhythm.     Pulses: Normal pulses.     Heart sounds: Normal heart sounds.  Pulmonary:     Effort: Pulmonary effort is normal.     Breath sounds: Normal breath sounds.  Abdominal:     General: There is no distension.  Musculoskeletal:        General: No swelling or tenderness. Normal range of motion.     Cervical back: Normal range of motion. No rigidity.  Lymphadenopathy:     Cervical: No cervical adenopathy.  Skin:    General: Skin is warm and dry.  Neurological:     General: No focal deficit present.     Mental Status: She is alert.    LAB RESULTS:  CMP     Component Value Date/Time   NA 140  07/20/2024 1002   NA 142 11/17/2017 1355   K 3.8 07/20/2024 1002   K 4.4 11/17/2017 1355   CL 110 07/20/2024 1002   CL 109 (H) 04/20/2013 0859   CO2 24 07/20/2024 1002   CO2 22 11/17/2017 1355   GLUCOSE 82 07/20/2024 1002   GLUCOSE 86 11/17/2017 1355   GLUCOSE 91 04/20/2013 0859   BUN 15 07/20/2024 1002   BUN 12.5 11/17/2017 1355   CREATININE 1.18 (H) 07/20/2024 1002   CREATININE 1.1 11/17/2017 1355   CALCIUM 8.2 (L) 07/20/2024 1002   CALCIUM 9.2 11/17/2017 1355   PROT 6.4 (L) 07/20/2024 1002   PROT 7.0 11/17/2017 1355   ALBUMIN  3.7 07/20/2024 1002   ALBUMIN  3.8 11/17/2017 1355   AST 16 07/20/2024 1002   AST 23 11/17/2017 1355   ALT 14 07/20/2024 1002   ALT 22 11/17/2017 1355   ALKPHOS 53 07/20/2024 1002   ALKPHOS 55 11/17/2017 1355   BILITOT 0.6 07/20/2024 1002   BILITOT 0.49 11/17/2017 1355   GFRNONAA 50 (L) 07/20/2024 1002   GFRNONAA 53 (L) 05/09/2016 1104   GFRAA 53 (L) 06/12/2020 1146   GFRAA 55 (L) 12/13/2019 1200   GFRAA 61 05/09/2016 1104    Lab Results  Component Value Date   TOTALPROTELP 6.0 03/04/2024   ALBUMINELP 3.4 03/04/2024   A1GS 0.4 03/04/2024   A2GS 0.6 03/04/2024   BETS 1.1 03/04/2024   GAMS 0.6 03/04/2024   MSPIKE Not Observed 03/04/2024   SPEI Comment 03/04/2024    Lab Results  Component Value Date   WBC 2.9 (L) 09/20/2024   NEUTROABS 2.4 09/20/2024   HGB 9.2 (L) 09/20/2024   HCT 28.1 (L) 09/20/2024   MCV 118.1 (H) 09/20/2024   PLT 152 09/20/2024    Lab Results  Component Value Date   LABCA2 18 08/21/2012    No components found for: [id number]  No results for input(s): INR in the  last 168 hours.   Lab Results  Component Value Date   LABCA2 18 08/21/2012    No results found for: CAN199  No results found for: CAN125  No results found for: RJW846  Lab Results  Component Value Date   CA2729 26.9 12/13/2019    No components found for: HGQUANT  No results found for: CEA1, CEA / No results found for:  CEA1, CEA   No results found for: AFPTUMOR  No results found for: CHROMOGRNA  No results found for: KPAFRELGTCHN, LAMBDASER, KAPLAMBRATIO (kappa/lambda light chains)  No results found for: HGBA, HGBA2QUANT, HGBFQUANT, HGBSQUAN (Hemoglobinopathy evaluation)   Lab Results  Component Value Date   LDH 195 08/21/2012    Lab Results  Component Value Date   IRON  236 (H) 03/04/2024   TIBC 634 (H) 03/04/2024   IRONPCTSAT 37 (H) 03/04/2024   (Iron  and TIBC)  Lab Results  Component Value Date   FERRITIN 8 (L) 03/04/2024    Urinalysis    Component Value Date/Time   COLORURINE YELLOW 06/28/2022 1122   APPEARANCEUR CLEAR 06/28/2022 1122   LABSPEC 1.015 06/28/2022 1122   LABSPEC 1.010 04/29/2016 1118   PHURINE 6.0 06/28/2022 1122   GLUCOSEU NEGATIVE 06/28/2022 1122   GLUCOSEU Negative 04/29/2016 1118   HGBUR NEGATIVE 06/28/2022 1122   BILIRUBINUR NEGATIVE 06/28/2022 1122   BILIRUBINUR Negative 04/29/2016 1118   KETONESUR NEGATIVE 06/28/2022 1122   PROTEINUR NEGATIVE 06/28/2022 1122   UROBILINOGEN 0.2 04/29/2016 1118   NITRITE NEGATIVE 06/28/2022 1122   LEUKOCYTESUR NEGATIVE 06/28/2022 1122   LEUKOCYTESUR Trace 04/29/2016 1118   CBC from today shows significant improvement in hemoglobin.  White blood cell count is 3100, total neutrophil count is 2500, no thrombocytopenia  STUDIES: NM PET Image Restage (PS) Skull Base to Thigh (F-18 FDG) Result Date: 09/20/2024 CLINICAL DATA:  Subsequent treatment strategy for metastatic breast cancer. EXAM: NUCLEAR MEDICINE PET SKULL BASE TO THIGH TECHNIQUE: 6.0 mCi F-18 FDG was injected intravenously. Full-ring PET imaging was performed from the skull base to thigh after the radiotracer. CT data was obtained and used for attenuation correction and anatomic localization. Fasting blood glucose: 99 mg/dl COMPARISON:  6/75/7974 FINDINGS: Mediastinal blood pool activity: SUV max 2.4 Liver activity: SUV max NA NECK: No  significant abnormal hypermetabolic activity in this region. Incidental CT findings: Left common carotid atheromatous vascular calcification. CHEST: Right hilar node maximum SUV 3.3 but reduced conspicuity compared to blood pool compared to the prior exam with the lesion had a maximum SUV of 2.6. Incidental CT findings: Aortic atherosclerosis. Right mastectomy and right axillary clips noted. Small bilateral pleural effusions, reduced in size on the right compared to the prior exam. Post therapy related findings anteriorly in the right lung. Similar appearance of scattered scarring as well as mild anterior nodularity in the left upper lobe which is not currently hypermetabolic. ABDOMEN/PELVIS: New moderate ascites especially along the right side and in the pelvis, with low-grade metabolic activity within the ascites such that inflammatory or neoplastic etiologies not excluded. No well-defined soft tissue mass along the peritoneum. Scattered physiologic activity in bowel. Incidental CT findings: Hepatic cirrhosis. Left upper quadrant embolization coils unchanged. Atherosclerosis is present, including aortoiliac atherosclerotic disease. SKELETON: No significant abnormal hypermetabolic activity in this region. Incidental CT findings: Remote compressions at L1 and L2. IMPRESSION: 1. New moderate ascites with low-grade metabolic activity within the ascites such that inflammatory or neoplastic etiologies not excluded. No well-defined soft tissue mass along the peritoneum. 2. Low-grade activity in a right hilar node  roughly similar to prior when compared to background blood pool activity. 3. Small bilateral pleural effusions, reduced in size on the right compared to the prior exam. 4. Hepatic cirrhosis. 5. Remote compressions at L1 and L2. 6.  Aortic Atherosclerosis (ICD10-I70.0). Electronically Signed   By: Ryan Salvage M.D.   On: 09/20/2024 09:58     COMPARISON: Abdominal ultrasound November 05, 2021    TECHNIQUE:  Real time limited abdomen ultrasound was performed.   IMAGE QUALITY: Satisfactory, of diagnostic value.   FINDINGS:   Limited sonographic views of the abdomen demonstrates mild abdominal ascites. There is no large pocket to safely drain.    IMPRESSION:  IMPRESSION:  1. Mild abdominal ascites.  2. No large enough pocket to safely drain.    ASSESSMENT: 68 y.o. [place] woman presenting June 2011 with stage IV breast cancer involving the right breast upper outer quadrant, right axilla, mediastinal lymph nodes, and both lungs, but not the brain, liver, or bones  (1) positive right breast and right axillary lymph node biopsies 07/17/2010 of a clinical T4 N2 M1 invasive ductal carcinoma, grade 1, estrogen receptor 100% positive, progesterone receptor 6% positive, with an MIB-1 of 12%, and no HER-2 amplification.  (2) participated in Phase II tessetaxel study, receiving 2 cycles complicated by thrombocytopenia, anemia requiring transfusion, transaminase elevation, and afebrile neutropenia. Off-study as of September 2011. Chest CT scan September 2011 did show evidence of response.  (3) on letrozole  as of October 2011, with continuing response and good tolerance   (4) 06/02/2012 underwent right mastectomy and axillary lymph node sampling (3 lymph nodes removed, all with viable cancer as well as evidence of treatment effect) for a ypT2 ypN1-2 invasive ductal carcinoma, grade 1, 98% estrogen receptor positive, progesterone receptor negative, with no HER-2 amplification  (5) left jugular vein DVT documented 06/16/2012; left sided Port-A-Cath removed mid July 2015; Coumadin  stopped 09/07/2014.  (a) Left brachiocephalic v. slightly narrowed, with some Left lower neck collateralization noted on chest CT December 2015  (6) osteoporosis, was on alendronate , switched to denosumab /Prolia  11/07/2016  (a) bone density in 10/2018 shows osteopenia T score of -2.2  (7) portal hypertension,  with splenomegaly, gastric varices, and intestinal edema  (a)  mild persistent leukopenia and thrombocytopenia likely due to splenomegaly  (b) iron  deficiency secondary to above, status post Feraheme  x2 December 2018  (8) restaging studies:  (a) chest CT 12/26/2017 shows a 4.5 mm left lower lobe pulmonary nodule which is unchanged.  There was no other evidence of active disease  (b) restaging chest CT in 05/2019 shows no change in bilateral pulmonary nodules, prominent mediastinal lymph nodes are stable  (9) CT scan of the abdomen 11/03/2021 shows thrombus in the portal, splenic and superior mesenteric vein  (A) Heparin  started 11/03/2021  10. PET scan 05/24/2022, low metabolic activity of right lower pulmonary nodule. No change in size of CT 02/27/2022, Carcinoma with mucinous features consistent with metastatic breast carcinoma, negative margins, no LN involvement.  Prognostics showed ER +100% strong staining, PR +20% staining, Ki-67 of 10% and HER2 2+ by IHC and negative by FISH  PLAN:  Assessment & Plan  Assessment & Plan  Metastatic breast cancer with no active disease. - Continue Ibrance , Faslodex , and Prolia . - Administer Faslodex  today. - Schedule Faslodex  for the next few months. - Schedule Prolia  for mid-December.  Pleural effusion with associated shortness of breath and fatigue Small bilateral PE likely from cirrhosis. No indication for thoracentesis at this time.  Anemia with macrocytosis, possible B12  deficiency Anemia with macrocytosis, hemoglobin at 9.2. No evidence of B12 or folate deficiency Could this be related to liver disease? Will continue to monitor.   Total time: 30 min  *Total Encounter Time as defined by the Centers for Medicare and Medicaid Services includes, in addition to the face-to-face time of a patient visit (documented in the note above) non-face-to-face time: obtaining and reviewing outside history, ordering and reviewing medications, tests or  procedures, care coordination (communications with other health care professionals or caregivers) and documentation in the medical record.

## 2024-09-22 ENCOUNTER — Other Ambulatory Visit: Payer: Self-pay

## 2024-09-22 NOTE — Progress Notes (Signed)
 Specialty Pharmacy Refill Coordination Note  Nicole [name] is a 68 y.o. female contacted today regarding refills of specialty medication(s) Palbociclib  (IBRANCE )   Patient requested [name] at [facility] at [place] date: 09/23/24   Medication will be filled on 09/23/24.

## 2024-09-23 ENCOUNTER — Other Ambulatory Visit (HOSPITAL_COMMUNITY): Payer: Self-pay

## 2024-09-23 ENCOUNTER — Other Ambulatory Visit: Payer: Self-pay

## 2024-10-06 ENCOUNTER — Other Ambulatory Visit (HOSPITAL_COMMUNITY): Payer: Self-pay

## 2024-10-07 ENCOUNTER — Encounter: Payer: Self-pay | Admitting: Hematology and Oncology

## 2024-10-07 ENCOUNTER — Telehealth: Payer: Self-pay

## 2024-10-07 ENCOUNTER — Other Ambulatory Visit (HOSPITAL_COMMUNITY): Payer: Self-pay

## 2024-10-07 NOTE — Telephone Encounter (Signed)
 Oral Oncology Patient Advocate Encounter   Was successful in securing patient a $8000 grant from [employer] ([employer]) to provide copayment coverage for Ibrance .  This will keep the out of pocket expense at $0.     The billing information is as follows and has been shared with [facility].   RxBin: [id number] PCN:  [id number] Member ID: [id number] Group ID: [id number] Dates of Eligibility: 04/09/24 through 10/06/2025   Charlott Hamilton,  CPhT-Adv  she/her/hers [place]  [facility] Pharmacy Technician Patient Advocate Specialist [facility] Phone: [phone]  Fax: [phone] [email or web address]

## 2024-10-14 ENCOUNTER — Other Ambulatory Visit: Payer: Self-pay

## 2024-10-15 ENCOUNTER — Encounter (INDEPENDENT_AMBULATORY_CARE_PROVIDER_SITE_OTHER): Payer: Self-pay

## 2024-10-15 ENCOUNTER — Other Ambulatory Visit: Payer: Self-pay

## 2024-10-15 NOTE — Progress Notes (Signed)
 Specialty Pharmacy Refill Coordination Note  Nicole [name] is a 68 y.o. female contacted today regarding refills of specialty medication(s) Palbociclib  (IBRANCE )   Patient requested (Patient-Rptd) Pickup at [facility] date: (Patient-Rptd) 10/18/24   Medication will be filled on 10/18/24.

## 2024-10-16 ENCOUNTER — Other Ambulatory Visit: Payer: Self-pay | Admitting: Hematology and Oncology

## 2024-10-18 ENCOUNTER — Inpatient Hospital Stay

## 2024-10-18 ENCOUNTER — Encounter: Payer: Self-pay | Admitting: Hematology and Oncology

## 2024-10-18 ENCOUNTER — Other Ambulatory Visit: Payer: Self-pay

## 2024-10-18 ENCOUNTER — Inpatient Hospital Stay: Attending: Hematology and Oncology

## 2024-10-18 VITALS — BP 112/63 | HR 90 | Temp 98.7°F | Resp 16

## 2024-10-18 DIAGNOSIS — Z1721 Progesterone receptor positive status: Secondary | ICD-10-CM | POA: Diagnosis not present

## 2024-10-18 DIAGNOSIS — M810 Age-related osteoporosis without current pathological fracture: Secondary | ICD-10-CM

## 2024-10-18 DIAGNOSIS — Z170 Estrogen receptor positive status [ER+]: Secondary | ICD-10-CM | POA: Insufficient documentation

## 2024-10-18 DIAGNOSIS — C7801 Secondary malignant neoplasm of right lung: Secondary | ICD-10-CM | POA: Insufficient documentation

## 2024-10-18 DIAGNOSIS — D649 Anemia, unspecified: Secondary | ICD-10-CM | POA: Insufficient documentation

## 2024-10-18 DIAGNOSIS — Z5111 Encounter for antineoplastic chemotherapy: Secondary | ICD-10-CM | POA: Diagnosis present

## 2024-10-18 DIAGNOSIS — C7802 Secondary malignant neoplasm of left lung: Secondary | ICD-10-CM | POA: Insufficient documentation

## 2024-10-18 DIAGNOSIS — C778 Secondary and unspecified malignant neoplasm of lymph nodes of multiple regions: Secondary | ICD-10-CM | POA: Diagnosis not present

## 2024-10-18 DIAGNOSIS — D509 Iron deficiency anemia, unspecified: Secondary | ICD-10-CM

## 2024-10-18 DIAGNOSIS — C50411 Malignant neoplasm of upper-outer quadrant of right female breast: Secondary | ICD-10-CM

## 2024-10-18 DIAGNOSIS — Z1732 Human epidermal growth factor receptor 2 negative status: Secondary | ICD-10-CM | POA: Diagnosis not present

## 2024-10-18 LAB — CBC WITH DIFFERENTIAL (CANCER CENTER ONLY)
Abs Immature Granulocytes: 0.02 K/uL (ref 0.00–0.07)
Basophils Absolute: 0 K/uL (ref 0.0–0.1)
Basophils Relative: 1 %
Eosinophils Absolute: 0.1 K/uL (ref 0.0–0.5)
Eosinophils Relative: 4 %
HCT: 23.6 % — ABNORMAL LOW (ref 36.0–46.0)
Hemoglobin: 7.5 g/dL — ABNORMAL LOW (ref 12.0–15.0)
Immature Granulocytes: 1 %
Lymphocytes Relative: 11 %
Lymphs Abs: 0.3 K/uL — ABNORMAL LOW (ref 0.7–4.0)
MCH: 37.7 pg — ABNORMAL HIGH (ref 26.0–34.0)
MCHC: 31.8 g/dL (ref 30.0–36.0)
MCV: 118.6 fL — ABNORMAL HIGH (ref 80.0–100.0)
Monocytes Absolute: 0.2 K/uL (ref 0.1–1.0)
Monocytes Relative: 7 %
Neutro Abs: 2 K/uL (ref 1.7–7.7)
Neutrophils Relative %: 76 %
Platelet Count: 139 K/uL — ABNORMAL LOW (ref 150–400)
RBC: 1.99 MIL/uL — ABNORMAL LOW (ref 3.87–5.11)
RDW: 17.2 % — ABNORMAL HIGH (ref 11.5–15.5)
WBC Count: 2.6 K/uL — ABNORMAL LOW (ref 4.0–10.5)
nRBC: 0 % (ref 0.0–0.2)

## 2024-10-18 LAB — CMP (CANCER CENTER ONLY)
ALT: 11 U/L (ref 0–44)
AST: 13 U/L — ABNORMAL LOW (ref 15–41)
Albumin: 3.8 g/dL (ref 3.5–5.0)
Alkaline Phosphatase: 52 U/L (ref 38–126)
Anion gap: 8 (ref 5–15)
BUN: 22 mg/dL (ref 8–23)
CO2: 23 mmol/L (ref 22–32)
Calcium: 9.2 mg/dL (ref 8.9–10.3)
Chloride: 105 mmol/L (ref 98–111)
Creatinine: 1.43 mg/dL — ABNORMAL HIGH (ref 0.44–1.00)
GFR, Estimated: 40 mL/min — ABNORMAL LOW (ref >60)
Glucose, Bld: 99 mg/dL (ref 70–99)
Potassium: 4.2 mmol/L (ref 3.5–5.1)
Sodium: 136 mmol/L (ref 135–145)
Total Bilirubin: 0.6 mg/dL (ref 0.0–1.2)
Total Protein: 6.4 g/dL — ABNORMAL LOW (ref 6.5–8.1)

## 2024-10-18 LAB — FERRITIN: Ferritin: 45 ng/mL (ref 11–307)

## 2024-10-18 LAB — SAMPLE TO BLOOD BANK

## 2024-10-18 LAB — IRON AND IRON BINDING CAPACITY (CC-WL,HP ONLY)
Iron: 62 ug/dL (ref 28–170)
Saturation Ratios: 13 % (ref 10.4–31.8)
TIBC: 491 ug/dL — ABNORMAL HIGH (ref 250–450)
UIBC: 429 ug/dL (ref 148–442)

## 2024-10-18 MED ORDER — FULVESTRANT 250 MG/5ML IM SOSY
500.0000 mg | PREFILLED_SYRINGE | Freq: Once | INTRAMUSCULAR | Status: AC
  Administered 2024-10-18: 500 mg via INTRAMUSCULAR
  Filled 2024-10-18: qty 10

## 2024-10-20 ENCOUNTER — Telehealth: Payer: Self-pay

## 2024-10-20 ENCOUNTER — Ambulatory Visit: Payer: Self-pay | Admitting: Hematology and Oncology

## 2024-10-20 ENCOUNTER — Other Ambulatory Visit: Payer: Self-pay

## 2024-10-20 DIAGNOSIS — M810 Age-related osteoporosis without current pathological fracture: Secondary | ICD-10-CM

## 2024-10-20 DIAGNOSIS — D509 Iron deficiency anemia, unspecified: Secondary | ICD-10-CM

## 2024-10-20 DIAGNOSIS — D500 Iron deficiency anemia secondary to blood loss (chronic): Secondary | ICD-10-CM

## 2024-10-20 NOTE — Telephone Encounter (Signed)
 Pt's husband Chyrl called and reports pt is experiencing SHOB and weakness. He is asking if there is an indication for PRBC xfusion. Advised per MD that she would need blood transfusion and to get in touch with her GI MD. Chyrl reports Iyannah sees Dr Saintclair and is scheduled for an endoscopy next month. She has not seen Dr Saintclair in a year. Advised pt we are trying to work on getting her a blood transfusion and we will be in touch asap.

## 2024-10-20 NOTE — Telephone Encounter (Signed)
 Since pt is symptomatic, she will need to come in for PRBC transfusion. She is scheduled for 2 units 10/23/24. All orders placed per MD.

## 2024-10-21 ENCOUNTER — Other Ambulatory Visit: Payer: Self-pay

## 2024-10-21 ENCOUNTER — Inpatient Hospital Stay

## 2024-10-21 ENCOUNTER — Telehealth: Payer: Self-pay

## 2024-10-21 DIAGNOSIS — C50411 Malignant neoplasm of upper-outer quadrant of right female breast: Secondary | ICD-10-CM

## 2024-10-21 DIAGNOSIS — Z5111 Encounter for antineoplastic chemotherapy: Secondary | ICD-10-CM | POA: Diagnosis not present

## 2024-10-21 DIAGNOSIS — M810 Age-related osteoporosis without current pathological fracture: Secondary | ICD-10-CM

## 2024-10-21 DIAGNOSIS — D509 Iron deficiency anemia, unspecified: Secondary | ICD-10-CM

## 2024-10-21 DIAGNOSIS — D500 Iron deficiency anemia secondary to blood loss (chronic): Secondary | ICD-10-CM

## 2024-10-21 LAB — CBC WITH DIFFERENTIAL (CANCER CENTER ONLY)
Abs Immature Granulocytes: 0.03 K/uL (ref 0.00–0.07)
Basophils Absolute: 0 K/uL (ref 0.0–0.1)
Basophils Relative: 1 %
Eosinophils Absolute: 0.1 K/uL (ref 0.0–0.5)
Eosinophils Relative: 2 %
HCT: 25.6 % — ABNORMAL LOW (ref 36.0–46.0)
Hemoglobin: 8.2 g/dL — ABNORMAL LOW (ref 12.0–15.0)
Immature Granulocytes: 1 %
Lymphocytes Relative: 8 %
Lymphs Abs: 0.3 K/uL — ABNORMAL LOW (ref 0.7–4.0)
MCH: 37.6 pg — ABNORMAL HIGH (ref 26.0–34.0)
MCHC: 32 g/dL (ref 30.0–36.0)
MCV: 117.4 fL — ABNORMAL HIGH (ref 80.0–100.0)
Monocytes Absolute: 0.3 K/uL (ref 0.1–1.0)
Monocytes Relative: 8 %
Neutro Abs: 2.7 K/uL (ref 1.7–7.7)
Neutrophils Relative %: 80 %
Platelet Count: 118 K/uL — ABNORMAL LOW (ref 150–400)
RBC: 2.18 MIL/uL — ABNORMAL LOW (ref 3.87–5.11)
RDW: 17.6 % — ABNORMAL HIGH (ref 11.5–15.5)
WBC Count: 3.4 K/uL — ABNORMAL LOW (ref 4.0–10.5)
nRBC: 0 % (ref 0.0–0.2)

## 2024-10-21 LAB — SAMPLE TO BLOOD BANK

## 2024-10-21 NOTE — Telephone Encounter (Signed)
 Called pt's husband to advise that her hgb increased from 7.5 to 8.2. Per MD, she should still receive 1 unit PRBC. Her schedule was changed to 10/24 at 0730. Confirmed orders with blood bank.

## 2024-10-22 ENCOUNTER — Inpatient Hospital Stay

## 2024-10-22 DIAGNOSIS — D500 Iron deficiency anemia secondary to blood loss (chronic): Secondary | ICD-10-CM

## 2024-10-22 DIAGNOSIS — M810 Age-related osteoporosis without current pathological fracture: Secondary | ICD-10-CM

## 2024-10-22 DIAGNOSIS — D509 Iron deficiency anemia, unspecified: Secondary | ICD-10-CM

## 2024-10-22 DIAGNOSIS — Z5111 Encounter for antineoplastic chemotherapy: Secondary | ICD-10-CM | POA: Diagnosis not present

## 2024-10-22 DIAGNOSIS — C50411 Malignant neoplasm of upper-outer quadrant of right female breast: Secondary | ICD-10-CM

## 2024-10-22 LAB — PREPARE RBC (CROSSMATCH)

## 2024-10-22 MED ORDER — ACETAMINOPHEN 325 MG PO TABS
650.0000 mg | ORAL_TABLET | Freq: Once | ORAL | Status: AC
  Administered 2024-10-22: 650 mg via ORAL
  Filled 2024-10-22: qty 2

## 2024-10-22 MED ORDER — DIPHENHYDRAMINE HCL 25 MG PO CAPS
25.0000 mg | ORAL_CAPSULE | Freq: Once | ORAL | Status: AC
  Administered 2024-10-22: 25 mg via ORAL
  Filled 2024-10-22: qty 1

## 2024-10-22 MED ORDER — SODIUM CHLORIDE 0.9% IV SOLUTION
250.0000 mL | INTRAVENOUS | Status: DC
  Administered 2024-10-22: 100 mL via INTRAVENOUS

## 2024-10-22 NOTE — Patient Instructions (Signed)

## 2024-10-23 ENCOUNTER — Inpatient Hospital Stay

## 2024-10-25 LAB — TYPE AND SCREEN
ABO/RH(D): O POS
Antibody Screen: POSITIVE
Donor AG Type: NEGATIVE
Unit division: 0

## 2024-10-25 LAB — BPAM RBC
Blood Product Expiration Date: 202511242359
ISSUE DATE / TIME: 202510240828
Unit Type and Rh: 5100

## 2024-10-26 ENCOUNTER — Telehealth: Payer: Self-pay | Admitting: Hematology and Oncology

## 2024-10-26 NOTE — Telephone Encounter (Signed)
 left vm for pt about scheduled appt date and times. Encouraged to call back if need to reschedule

## 2024-10-29 ENCOUNTER — Other Ambulatory Visit: Payer: Self-pay

## 2024-10-29 NOTE — Progress Notes (Signed)
 Specialty Pharmacy Ongoing Clinical Assessment Note  Nicole [name] is a 68 y.o. female who is being followed by the specialty pharmacy service for RxSp Oncology   Patient's specialty medication(s) reviewed today: Palbociclib  (IBRANCE )   Missed doses in the last 4 weeks: 0   Patient/Caregiver did not have any additional questions or concerns.   Therapeutic benefit summary: Patient is achieving benefit   Adverse events/side effects summary: Experienced adverse events/side effects (some fatigue and a cough/tickle in her throat, but tolerable at this time)   Patient's therapy is appropriate to: Continue    Goals Addressed             This Visit's Progress    Maintain optimal adherence to therapy   On track    Patient is on track. Patient will maintain adherence         Follow up: 6 months  [facility] Specialty Pharmacist

## 2024-11-02 ENCOUNTER — Telehealth: Payer: Self-pay

## 2024-11-02 NOTE — Telephone Encounter (Signed)
 Spoke with patient and confirmed appointment on 11/4

## 2024-11-03 ENCOUNTER — Inpatient Hospital Stay: Admitting: Hematology and Oncology

## 2024-11-03 ENCOUNTER — Other Ambulatory Visit: Payer: Self-pay

## 2024-11-03 ENCOUNTER — Inpatient Hospital Stay: Attending: Hematology and Oncology

## 2024-11-03 ENCOUNTER — Other Ambulatory Visit: Payer: Self-pay | Admitting: *Deleted

## 2024-11-03 VITALS — BP 104/63 | HR 84 | Temp 97.5°F | Resp 17 | Wt 129.4 lb

## 2024-11-03 DIAGNOSIS — Z86718 Personal history of other venous thrombosis and embolism: Secondary | ICD-10-CM | POA: Diagnosis not present

## 2024-11-03 DIAGNOSIS — E611 Iron deficiency: Secondary | ICD-10-CM | POA: Diagnosis not present

## 2024-11-03 DIAGNOSIS — R161 Splenomegaly, not elsewhere classified: Secondary | ICD-10-CM | POA: Insufficient documentation

## 2024-11-03 DIAGNOSIS — Z5111 Encounter for antineoplastic chemotherapy: Secondary | ICD-10-CM | POA: Insufficient documentation

## 2024-11-03 DIAGNOSIS — Z79899 Other long term (current) drug therapy: Secondary | ICD-10-CM | POA: Insufficient documentation

## 2024-11-03 DIAGNOSIS — C7802 Secondary malignant neoplasm of left lung: Secondary | ICD-10-CM | POA: Insufficient documentation

## 2024-11-03 DIAGNOSIS — D638 Anemia in other chronic diseases classified elsewhere: Secondary | ICD-10-CM | POA: Insufficient documentation

## 2024-11-03 DIAGNOSIS — Z853 Personal history of malignant neoplasm of breast: Secondary | ICD-10-CM | POA: Diagnosis not present

## 2024-11-03 DIAGNOSIS — I864 Gastric varices: Secondary | ICD-10-CM | POA: Diagnosis not present

## 2024-11-03 DIAGNOSIS — K7469 Other cirrhosis of liver: Secondary | ICD-10-CM

## 2024-11-03 DIAGNOSIS — R7401 Elevation of levels of liver transaminase levels: Secondary | ICD-10-CM | POA: Diagnosis not present

## 2024-11-03 DIAGNOSIS — D696 Thrombocytopenia, unspecified: Secondary | ICD-10-CM | POA: Insufficient documentation

## 2024-11-03 DIAGNOSIS — C7801 Secondary malignant neoplasm of right lung: Secondary | ICD-10-CM | POA: Insufficient documentation

## 2024-11-03 DIAGNOSIS — C50411 Malignant neoplasm of upper-outer quadrant of right female breast: Secondary | ICD-10-CM | POA: Diagnosis not present

## 2024-11-03 DIAGNOSIS — C778 Secondary and unspecified malignant neoplasm of lymph nodes of multiple regions: Secondary | ICD-10-CM | POA: Diagnosis not present

## 2024-11-03 DIAGNOSIS — Z1721 Progesterone receptor positive status: Secondary | ICD-10-CM | POA: Diagnosis not present

## 2024-11-03 DIAGNOSIS — I8510 Secondary esophageal varices without bleeding: Secondary | ICD-10-CM

## 2024-11-03 DIAGNOSIS — K922 Gastrointestinal hemorrhage, unspecified: Secondary | ICD-10-CM

## 2024-11-03 DIAGNOSIS — K766 Portal hypertension: Secondary | ICD-10-CM

## 2024-11-03 DIAGNOSIS — K7460 Unspecified cirrhosis of liver: Secondary | ICD-10-CM | POA: Diagnosis not present

## 2024-11-03 DIAGNOSIS — D509 Iron deficiency anemia, unspecified: Secondary | ICD-10-CM

## 2024-11-03 DIAGNOSIS — R188 Other ascites: Secondary | ICD-10-CM

## 2024-11-03 DIAGNOSIS — F32A Depression, unspecified: Secondary | ICD-10-CM | POA: Diagnosis not present

## 2024-11-03 DIAGNOSIS — Z9011 Acquired absence of right breast and nipple: Secondary | ICD-10-CM | POA: Diagnosis not present

## 2024-11-03 DIAGNOSIS — D709 Neutropenia, unspecified: Secondary | ICD-10-CM | POA: Diagnosis not present

## 2024-11-03 DIAGNOSIS — M810 Age-related osteoporosis without current pathological fracture: Secondary | ICD-10-CM | POA: Diagnosis not present

## 2024-11-03 DIAGNOSIS — K7689 Other specified diseases of liver: Secondary | ICD-10-CM | POA: Diagnosis not present

## 2024-11-03 DIAGNOSIS — Z170 Estrogen receptor positive status [ER+]: Secondary | ICD-10-CM | POA: Diagnosis not present

## 2024-11-03 DIAGNOSIS — D500 Iron deficiency anemia secondary to blood loss (chronic): Secondary | ICD-10-CM

## 2024-11-03 DIAGNOSIS — D508 Other iron deficiency anemias: Secondary | ICD-10-CM

## 2024-11-03 LAB — CMP (CANCER CENTER ONLY)
ALT: 12 U/L (ref 0–44)
AST: 16 U/L (ref 15–41)
Albumin: 3.9 g/dL (ref 3.5–5.0)
Alkaline Phosphatase: 54 U/L (ref 38–126)
Anion gap: 7 (ref 5–15)
BUN: 20 mg/dL (ref 8–23)
CO2: 24 mmol/L (ref 22–32)
Calcium: 8.9 mg/dL (ref 8.9–10.3)
Chloride: 107 mmol/L (ref 98–111)
Creatinine: 1.11 mg/dL — ABNORMAL HIGH (ref 0.44–1.00)
GFR, Estimated: 54 mL/min — ABNORMAL LOW (ref >60)
Glucose, Bld: 100 mg/dL — ABNORMAL HIGH (ref 70–99)
Potassium: 4 mmol/L (ref 3.5–5.1)
Sodium: 138 mmol/L (ref 135–145)
Total Bilirubin: 0.8 mg/dL (ref 0.0–1.2)
Total Protein: 6.5 g/dL (ref 6.5–8.1)

## 2024-11-03 LAB — FERRITIN: Ferritin: 55 ng/mL (ref 11–307)

## 2024-11-03 LAB — LACTATE DEHYDROGENASE: LDH: 242 U/L — ABNORMAL HIGH (ref 98–192)

## 2024-11-03 LAB — CBC WITH DIFFERENTIAL (CANCER CENTER ONLY)
Abs Immature Granulocytes: 0.02 K/uL (ref 0.00–0.07)
Basophils Absolute: 0 K/uL (ref 0.0–0.1)
Basophils Relative: 1 %
Eosinophils Absolute: 0.1 K/uL (ref 0.0–0.5)
Eosinophils Relative: 2 %
HCT: 25.6 % — ABNORMAL LOW (ref 36.0–46.0)
Hemoglobin: 8.1 g/dL — ABNORMAL LOW (ref 12.0–15.0)
Immature Granulocytes: 1 %
Lymphocytes Relative: 12 %
Lymphs Abs: 0.3 K/uL — ABNORMAL LOW (ref 0.7–4.0)
MCH: 37.2 pg — ABNORMAL HIGH (ref 26.0–34.0)
MCHC: 31.6 g/dL (ref 30.0–36.0)
MCV: 117.4 fL — ABNORMAL HIGH (ref 80.0–100.0)
Monocytes Absolute: 0.3 K/uL (ref 0.1–1.0)
Monocytes Relative: 13 %
Neutro Abs: 2 K/uL (ref 1.7–7.7)
Neutrophils Relative %: 71 %
Platelet Count: 112 K/uL — ABNORMAL LOW (ref 150–400)
RBC: 2.18 MIL/uL — ABNORMAL LOW (ref 3.87–5.11)
RDW: 20.3 % — ABNORMAL HIGH (ref 11.5–15.5)
WBC Count: 2.7 K/uL — ABNORMAL LOW (ref 4.0–10.5)
nRBC: 0 % (ref 0.0–0.2)

## 2024-11-03 LAB — IRON AND IRON BINDING CAPACITY (CC-WL,HP ONLY)
Iron: 68 ug/dL (ref 28–170)
Saturation Ratios: 15 % (ref 10.4–31.8)
TIBC: 455 ug/dL — ABNORMAL HIGH (ref 250–450)
UIBC: 387 ug/dL (ref 148–442)

## 2024-11-03 LAB — VITAMIN B12: Vitamin B-12: 1103 pg/mL — ABNORMAL HIGH (ref 180–914)

## 2024-11-03 NOTE — Progress Notes (Signed)
 [facility]  Telephone:([phone]) [phone] Fax:(336) [phone]    ID: Nicole [name]   DOB: 04-Oct-1956  MR#: [id number]  RDW#:[id number]  Patient Care Team: [facility] [place] as PCP - General (Family Medicine) Hays Morrison, CRNP as Nurse Practitioner (Nurse Practitioner) Ebbie Cough, MD as Consulting Physician (General Surgery) Saintclair Jasper, MD as Consulting Physician (Gastroenterology) Loretha Ash, MD as Consulting Physician (Hematology and Oncology)  CHIEF COMPLAINT:  Metastatic Breast Cancer (s/p right mastectomy)  CURRENT TREATMENT: Ibrance  and Faslodex , prolia  every 6 months.  INTERVAL HISTORY:  Discussed the use of AI scribe software for clinical note transcription with the patient, who gave verbal consent to proceed. History of Present Illness  Nicole [name] is a 68 year old female with liver cirrhosis and anemia who presents with abdominal distension and fatigue. She is accompanied by her husband. She experiences fatigue, which she attributes to her anemia. Her hemoglobin level is currently 8.1, consistent with two weeks ago. A blood transfusion received a couple of weeks ago provided temporary relief. She experiences shakiness and is uncertain about her current B12 supplement intake.  Abdominal distension has developed over the past few days, described as uncomfortable and occasionally painful when leaning over. She is constipated and initially thought the distension was due to weight gain, but it is localized to her abdomen. She has a history of liver disease and has previously experienced ascites. She describes a sensation of something 'flipping' in her abdomen when leaning over, which resolves upon sitting upright.  No blood in stool or black stools. She has a history of portal hypertensive gastropathy and esophageal varices. Her iron  levels are being monitored as they were low during previous bleeding episodes.  Her legs feel heavy,  particularly in the calves, when standing or walking, but do not swell. She attributes this to her anemia or possible fluid accumulation due to cirrhosis.  She is currently taking Ibrance , Faslodex , and a bone strengthener. She has no new dental problems and is taking Effexor  for depression. She feels depressed and fatigued, stating she wants to sleep constantly and struggles with daily activities. Her husband assists with her medication management and caregiving.    COVID 19 VACCINATION STATUS: Status post Pfizer x2+ booster August 2021   BREAST CANCER HISTORY: From Dr. Maude Rubin's 07/25/2010 note:  This woman has not had any significant medical intervention for some time.  Her last mammogram was about seven years ago.  She noted a right breast mass about a year ago and did not seek immediate medical attention for this.  She ultimately, after some time, admitted this to her husband and self-referred herself for intervention.  She had a mammogram on 07/17/2010 with an ultrasound of the right breast.  This showed a lobulated mass upper right outer quadrant measuring at least 6.3 x 7.3 cm and large right axillary lymph nodes were also noted.  There was skin thickening overlying the mass.  On physical exam, this mass was about an 8 cm with skin dimpling noted. Discolored area in the skin over the mass, fullness in the axilla.  Biopsy was recommended, which took place on 07/17/2010.  Pathology showed invasive ductal cancer involving both lymph node and breast.  The HER-2 was not amplified.  ER and PR both positive at 100% and 6% respectively.  Proliferative index was 12% involving both the lymph node and breast mass.  An MRI scan of both breasts was performed on 07/22/2010, essentially which showed a large heterogeneously  enhancing mass of the right breast with washout kinetics measuring 6.4 x 4.4 x 5.0 cm.  Numerous satellite nodules were seen throughout the dominant mass.  There were several suspicious  nodules in the upper central and upper inner quadrant of the right breast.  There were two enhancing subcentimeter nodules seen in the right pectoralis muscle.  No obvious chest wall nodules were seen; however, there was seen some metastatic adenopathy in the mediastinum as well as hila with a 2.4 x 2.0 cm mediastinal lymph node as well as 1.2 x 3.0 cm subcarinal lymph node.  There was a right hilar and probable left hilar adenopathy.  Bilateral pulmonary nodules were seen and a right T2 lesion was seen anterior right liver as well.  Her subsequent history is as detailed below.   PAST MEDICAL HISTORY: Past Medical History:  Diagnosis Date   Anemia    low iron    Arthritis    fingers   Blood transfusion 2012   Breast CA (HCC)    breast ca dx 06/2010, stage 4, right , Lung Metasis.Surgery, Chemo, Radiation   Breast cancer (HCC)    Clot    jugular  ?2015   Depression    Esophageal varices (HCC)    Headache    Mirgraine- rare since menopause   Heart murmur    mild, no cardiologist, from birth   Hematemesis 08/2017   History of kidney stones    no pain- shows in CT- small 1 mm bilaterally- kidney   Hypertension    IBD (inflammatory bowel disease)    resolved   Idiopathic cirrhosis (HCC)    pt denies   Peripheral vascular disease few yrs ago   clot in right jugular   Portal hypertension (HCC)    Radiation 07/21/12-09/07/12   5940 cGy   Restless legs     PAST SURGICAL HISTORY: Past Surgical History:  Procedure Laterality Date   BIOPSY  08/21/2020   Procedure: BIOPSY;  Surgeon: Saintclair Jasper, MD;  Location: [facility] ENDOSCOPY;  Service: Gastroenterology;;   COLONOSCOPY  2016   DILATION AND CURETTAGE OF UTERUS  2004?   ESOPHAGEAL BANDING  09/04/2017   Procedure: ESOPHAGEAL BANDING;  Surgeon: Saintclair Jasper, MD;  Location: [facility] ENDOSCOPY;  Service: Gastroenterology;;   ESOPHAGEAL BANDING  11/27/2017   Procedure: ESOPHAGEAL BANDING;  Surgeon: Saintclair Jasper, MD;  Location: [facility] ENDOSCOPY;  Service:  Gastroenterology;;   ESOPHAGEAL BANDING N/A 04/28/2018   Procedure: ESOPHAGEAL BANDING;  Surgeon: Saintclair Jasper, MD;  Location: [facility]OSCOPY;  Service: Gastroenterology;  Laterality: N/A;   ESOPHAGEAL BANDING  11/17/2018   Procedure: ESOPHAGEAL BANDING;  Surgeon: Saintclair Jasper, MD;  Location: [facility];  Service: Gastroenterology;;   ESOPHAGEAL BANDING  08/16/2019   Procedure: ESOPHAGEAL BANDING;  Surgeon: Saintclair Jasper, MD;  Location: [facility];  Service: Gastroenterology;;   ESOPHAGEAL BANDING N/A 08/21/2020   Procedure: ESOPHAGEAL BANDING;  Surgeon: Saintclair Jasper, MD;  Location: [facility];  Service: Gastroenterology;  Laterality: N/A;   ESOPHAGEAL BANDING  10/21/2023   Procedure: ESOPHAGEAL BANDING;  Surgeon: Saintclair Jasper, MD;  Location: [facility];  Service: Gastroenterology;;   ESOPHAGOGASTRODUODENOSCOPY N/A 04/28/2018   Procedure: ESOPHAGOGASTRODUODENOSCOPY (EGD);  Surgeon: Saintclair Jasper, MD;  Location: [facility];  Service: Gastroenterology;  Laterality: N/A;   ESOPHAGOGASTRODUODENOSCOPY N/A 11/17/2018   Procedure: ESOPHAGOGASTRODUODENOSCOPY (EGD);  Surgeon: Saintclair Jasper, MD;  Location: [facility];  Service: Gastroenterology;  Laterality: N/A;   ESOPHAGOGASTRODUODENOSCOPY N/A 08/13/2022   Procedure: ESOPHAGOGASTRODUODENOSCOPY (EGD);  Surgeon: Saintclair Jasper, MD;  Location: [facility];  Service: Gastroenterology;  Laterality: N/A;   ESOPHAGOGASTRODUODENOSCOPY (EGD) WITH PROPOFOL  N/A 04/11/2016   Procedure: ESOPHAGOGASTRODUODENOSCOPY (EGD) WITH PROPOFOL ;  Surgeon: Oliva Boots, MD;  Location: [facility];  Service: Endoscopy;  Laterality: N/A;   ESOPHAGOGASTRODUODENOSCOPY (EGD) WITH PROPOFOL  N/A 07/08/2016   Procedure: ESOPHAGOGASTRODUODENOSCOPY (EGD) WITH PROPOFOL ;  Surgeon: Lynwood Bohr, MD;  Location: [facility] ENDOSCOPY;  Service: Endoscopy;  Laterality: N/A;   ESOPHAGOGASTRODUODENOSCOPY (EGD) WITH PROPOFOL  N/A 03/03/2017   Procedure: ESOPHAGOGASTRODUODENOSCOPY (EGD) WITH PROPOFOL ;  Surgeon: Lynwood Bohr, MD;  Location: [facility] ENDOSCOPY;  Service: Endoscopy;  Laterality: N/A;   ESOPHAGOGASTRODUODENOSCOPY (EGD) WITH PROPOFOL  N/A 09/04/2017   Procedure: ESOPHAGOGASTRODUODENOSCOPY (EGD) WITH PROPOFOL ;  Surgeon: Saintclair Jasper, MD;  Location: [facility] ENDOSCOPY;  Service: Gastroenterology;  Laterality: N/A;   ESOPHAGOGASTRODUODENOSCOPY (EGD) WITH PROPOFOL  N/A 11/27/2017   Procedure: ESOPHAGOGASTRODUODENOSCOPY (EGD);  Surgeon: Saintclair Jasper, MD;  Location: [facility] ENDOSCOPY;  Service: Gastroenterology;  Laterality: N/A;   ESOPHAGOGASTRODUODENOSCOPY (EGD) WITH PROPOFOL  N/A 08/16/2019   Procedure: ESOPHAGOGASTRODUODENOSCOPY (EGD) WITH PROPOFOL ;  Surgeon: Saintclair Jasper, MD;  Location: [facility];  Service: Gastroenterology;  Laterality: N/A;   ESOPHAGOGASTRODUODENOSCOPY (EGD) WITH PROPOFOL  N/A 08/21/2020   Procedure: ESOPHAGOGASTRODUODENOSCOPY (EGD) WITH PROPOFOL ;  Surgeon: Saintclair Jasper, MD;  Location: [facility];  Service: Gastroenterology;  Laterality: N/A;   ESOPHAGOGASTRODUODENOSCOPY (EGD) WITH PROPOFOL  N/A 08/24/2021   Procedure: ESOPHAGOGASTRODUODENOSCOPY (EGD) WITH PROPOFOL ;  Surgeon: Saintclair Jasper, MD;  Location: [facility];  Service: Gastroenterology;  Laterality: N/A;   ESOPHAGOGASTRODUODENOSCOPY (EGD) WITH PROPOFOL  N/A 06/25/2023   Procedure: ESOPHAGOGASTRODUODENOSCOPY (EGD) WITH PROPOFOL ;  Surgeon: Boots Oliva, MD;  Location: [facility];  Service: Gastroenterology;  Laterality: N/A;  W. bandging   ESOPHAGOGASTRODUODENOSCOPY (EGD) WITH PROPOFOL  N/A 10/21/2023   Procedure: ESOPHAGOGASTRODUODENOSCOPY (EGD) WITH PROPOFOL ;  Surgeon: Saintclair Jasper, MD;  Location: [facility];  Service: Gastroenterology;  Laterality: N/A;  with banding and APC   GASTRIC VARICES BANDING N/A 08/24/2021   Procedure: GASTRIC VARICES BANDING;  Surgeon: Saintclair Jasper, MD;  Location: [facility];  Service: Gastroenterology;  Laterality: N/A;   GASTRIC VARICES BANDING N/A 08/13/2022   Procedure: GASTRIC VARICES BANDING;  Surgeon: Saintclair Jasper,  MD;  Location: [facility] END[facility];  Service: Gastroenterology;  Laterality: N/A;   HEMOSTASIS CLIP PLACEMENT  08/21/2020   Procedure: HEMOSTASIS CLIP PLACEMENT;  Surgeon: Saintclair Jasper, MD;  Location: [facility];  Service: Gastroenterology;;   HOT HEMOSTASIS N/A 10/21/2023   Procedure: HOT HEMOSTASIS (ARGON PLASMA COAGULATION/BICAP);  Surgeon: Saintclair Jasper, MD;  Location: [facility];  Service: Gastroenterology;  Laterality: N/A;   INTERCOSTAL NERVE BLOCK Left 07/01/2022   Procedure: INTERCOSTAL NERVE BLOCK;  Surgeon: Kerrin Elspeth BROCKS, MD;  Location: [facility] OR;  Service: Thoracic;  Laterality: Left;   IR GENERIC HISTORICAL  05/15/2016   IR RADIOLOGIST EVAL & MGMT 05/15/2016 Ami Bellman, DO [facility] INTERV RAD   IR GENERIC HISTORICAL  01/14/2017   IR US  GUIDE VASC ACCESS RIGHT 01/14/2017 Ozell Specking, MD [facility]   IR GENERIC HISTORICAL  01/14/2017   IR VENOGRAM HEPATIC W HEMODYNAMIC EVALUATION 01/14/2017 Ozell Specking, MD [facility]   IR RADIOLOGIST EVAL & MGMT  06/12/2017   IR RADIOLOGIST EVAL & MGMT  08/11/2018   IR RADIOLOGIST EVAL & MGMT  08/25/2019   IR RADIOLOGIST EVAL & MGMT  08/22/2020   IR RADIOLOGIST EVAL & MGMT  03/11/2022   MASTECTOMY Right 06/02/12   right breast with lymph node removal   NODE DISSECTION Left 07/01/2022   Procedure: NODE DISSECTION;  Surgeon: Kerrin Elspeth BROCKS, MD;  Location: [facility] OR;  Service: Thoracic;  Laterality: Left;  PORT-A-CATH REMOVAL     PORTACATH PLACEMENT  2011   left side   RADIOFREQUENCY ABLATION  06/25/2023   Procedure: RADIO FREQUENCY ABLATION;  Surgeon: Rosalie Kitchens, MD;  Location: [facility];  Service: Gastroenterology;;   RADIOLOGY WITH ANESTHESIA N/A 04/13/2016   Procedure: RADIOLOGY WITH ANESTHESIA;  Surgeon: Ami Bellman, DO;  Location: [facility] OR;  Service: Anesthesiology;  Laterality: N/A;   VIDEO BRONCHOSCOPY WITH ENDOBRONCHIAL NAVIGATION N/A 07/01/2022   Procedure: VIDEO BRONCHOSCOPY WITH ENDOBRONCHIAL NAVIGATION;  Surgeon: Kerrin Elspeth BROCKS, MD;   Location: [facility] OR;  Service: Thoracic;  Laterality: N/A;   XI ROBOTIC ASSISTED THORACOSCOPY- SEGMENTECTOMY Left 07/01/2022   Procedure: XI ROBOTIC ASSISTED THORACOSCOPY-LEFT LOWER LOBE BASILAR SEGMENTECTOMY;  Surgeon: Kerrin Elspeth BROCKS, MD;  Location: [facility] OR;  Service: Thoracic;  Laterality: Left;    FAMILY HISTORY Family History  Problem Relation Age of Onset   COPD Mother    AAA (abdominal aortic aneurysm) Mother    COPD Father    Cancer Neg Hx   The patient's father died at the age of 23 from heart disease. Patient's mother died at the age of 70 status post CABG. The patient had no brothers, 2 sisters. There is no history of breast or ovarian cancer in the family.   GYNECOLOGIC HISTORY: Updated May 2018 Menarche age 76, she is GX P0. She does not recall when she went through menopause. She never took hormone replacement.   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 Husband Chyrl is retired from [employer]. They have no children and no pets. They attend a local [address].    ADVANCED DIRECTIVES: Not in place   HEALTH MAINTENANCE: Social History   Tobacco Use   Smoking status: Never   Smokeless tobacco: Never  Vaping Use   Vaping status: Never Used  Substance Use Topics   Alcohol use: No   Drug use: No     Colonoscopy: Never  PAP: Remote  Bone density: 09/27/2013, osteoporosis with a T score of -2.8  Lipid panel: Not on file    No Known Allergies   Current Outpatient Medications  Medication Sig Dispense Refill   denosumab  (PROLIA ) 60 MG/ML SOLN injection Inject 60 mg into the skin every 6 (six) months. Administer in upper arm, thigh, or abdomen     ELIQUIS  5 MG TABS tablet TAKE 1 TABLET BY MOUTH TWICE A DAY 60 tablet 2   ferrous sulfate  325 (65 FE) MG tablet Take 1 tablet (325 mg total) by mouth daily before breakfast. (Patient not taking: Reported on  09/20/2024) 90 tablet 0   fulvestrant  (FASLODEX ) 250 MG/5ML injection Inject 250-500 mg into the muscle See admin instructions. Loading dose of 500mg  on days 1,15, and 29. THEN 250mg  every 28 days thereafter     furosemide  (LASIX ) 20 MG tablet Take 20 mg by mouth daily.     Milk Thistle 1000 MG CAPS Take 1,000 mg by mouth daily.     palbociclib  (IBRANCE ) 75 MG tablet Take 1 tablet (75 mg total) by mouth daily. Take for 21 days on, 7 days off, repeat every 28 days. 21 tablet 3   pantoprazole  (PROTONIX ) 40 MG tablet Take 40 mg by mouth daily.     propranolol  (INDERAL ) 20 MG tablet Take 1 tablet (20 mg total) by mouth 2 (two) times daily. 60 tablet 0   spironolactone  (ALDACTONE ) 25 MG tablet Take 25  mg by mouth daily. (Patient not taking: Reported on 09/20/2024)     venlafaxine  XR (EFFEXOR -XR) 150 MG 24 hr capsule TAKE 1 CAPSULE BY MOUTH DAILY WITH BREAKFAST. 90 capsule 3   No current facility-administered medications for this visit.   Facility-Administered Medications Ordered in Other Visits  Medication Dose Route Frequency Provider Last Rate Last Admin   denosumab  (PROLIA ) injection 60 mg  60 mg Subcutaneous Once Magrinat, Sandria BROCKS, MD        OBJECTIVE: white woman who appears younger than stated age  Vitals:   11/03/24 1425  BP: 104/63  Pulse: 84  Resp: 17  Temp: (!) 97.5 F (36.4 C)  TempSrc: Temporal  SpO2: 99%  Weight: 129 lb 6.4 oz (58.7 kg)     Physical Exam Constitutional:      Appearance: Normal appearance. She is not ill-appearing.     Comments:    Cardiovascular:     Rate and Rhythm: Normal rate and regular rhythm.     Pulses: Normal pulses.     Heart sounds: Normal heart sounds.  Pulmonary:     Effort: Pulmonary effort is normal.     Breath sounds: Normal breath sounds.  Abdominal:     General: There is distension.  Musculoskeletal:        General: No swelling or tenderness. Normal range of motion.     Cervical back: Normal range of motion. No rigidity.   Lymphadenopathy:     Cervical: No cervical adenopathy.  Skin:    General: Skin is warm and dry.  Neurological:     General: No focal deficit present.     Mental Status: She is alert.    LAB RESULTS:  CMP     Component Value Date/Time   NA 136 10/18/2024 1016   NA 142 11/17/2017 1355   K 4.2 10/18/2024 1016   K 4.4 11/17/2017 1355   CL 105 10/18/2024 1016   CL 109 (H) 04/20/2013 0859   CO2 23 10/18/2024 1016   CO2 22 11/17/2017 1355   GLUCOSE 99 10/18/2024 1016   GLUCOSE 86 11/17/2017 1355   GLUCOSE 91 04/20/2013 0859   BUN 22 10/18/2024 1016   BUN 12.5 11/17/2017 1355   CREATININE 1.43 (H) 10/18/2024 1016   CREATININE 1.1 11/17/2017 1355   CALCIUM 9.2 10/18/2024 1016   CALCIUM 9.2 11/17/2017 1355   PROT 6.4 (L) 10/18/2024 1016   PROT 7.0 11/17/2017 1355   ALBUMIN  3.8 10/18/2024 1016   ALBUMIN  3.8 11/17/2017 1355   AST 13 (L) 10/18/2024 1016   AST 23 11/17/2017 1355   ALT 11 10/18/2024 1016   ALT 22 11/17/2017 1355   ALKPHOS 52 10/18/2024 1016   ALKPHOS 55 11/17/2017 1355   BILITOT 0.6 10/18/2024 1016   BILITOT 0.49 11/17/2017 1355   GFRNONAA 40 (L) 10/18/2024 1016   GFRNONAA 53 (L) 05/09/2016 1104   GFRAA 53 (L) 06/12/2020 1146   GFRAA 55 (L) 12/13/2019 1200   GFRAA 61 05/09/2016 1104    Lab Results  Component Value Date   TOTALPROTELP 6.0 03/04/2024   ALBUMINELP 3.4 03/04/2024   A1GS 0.4 03/04/2024   A2GS 0.6 03/04/2024   BETS 1.1 03/04/2024   GAMS 0.6 03/04/2024   MSPIKE Not Observed 03/04/2024   SPEI Comment 03/04/2024    Lab Results  Component Value Date   WBC 2.7 (L) 11/03/2024   NEUTROABS 2.0 11/03/2024   HGB 8.1 (L) 11/03/2024   HCT 25.6 (L) 11/03/2024   MCV 117.4 (H) 11/03/2024  PLT 112 (L) 11/03/2024    Lab Results  Component Value Date   LABCA2 18 08/21/2012    No components found for: [id number]  No results for input(s): INR in the last 168 hours.   Lab Results  Component Value Date   LABCA2 18 08/21/2012    No  results found for: CAN199  No results found for: CAN125  No results found for: RJW846  Lab Results  Component Value Date   CA2729 26.9 12/13/2019    No components found for: HGQUANT  No results found for: CEA1, CEA / No results found for: CEA1, CEA   No results found for: AFPTUMOR  No results found for: CHROMOGRNA  No results found for: KPAFRELGTCHN, LAMBDASER, KAPLAMBRATIO (kappa/lambda light chains)  No results found for: HGBA, HGBA2QUANT, HGBFQUANT, HGBSQUAN (Hemoglobinopathy evaluation)   Lab Results  Component Value Date   LDH 195 08/21/2012    Lab Results  Component Value Date   IRON  62 10/18/2024   TIBC 491 (H) 10/18/2024   IRONPCTSAT 13 10/18/2024   (Iron  and TIBC)  Lab Results  Component Value Date   FERRITIN 45 10/18/2024    Urinalysis    Component Value Date/Time   COLORURINE YELLOW 06/28/2022 1122   APPEARANCEUR CLEAR 06/28/2022 1122   LABSPEC 1.015 06/28/2022 1122   LABSPEC 1.010 04/29/2016 1118   PHURINE 6.0 06/28/2022 1122   GLUCOSEU NEGATIVE 06/28/2022 1122   GLUCOSEU Negative 04/29/2016 1118   HGBUR NEGATIVE 06/28/2022 1122   BILIRUBINUR NEGATIVE 06/28/2022 1122   BILIRUBINUR Negative 04/29/2016 1118   KETONESUR NEGATIVE 06/28/2022 1122   PROTEINUR NEGATIVE 06/28/2022 1122   UROBILINOGEN 0.2 04/29/2016 1118   NITRITE NEGATIVE 06/28/2022 1122   LEUKOCYTESUR NEGATIVE 06/28/2022 1122   LEUKOCYTESUR Trace 04/29/2016 1118   CBC from today shows significant improvement in hemoglobin.  White blood cell count is 3100, total neutrophil count is 2500, no thrombocytopenia  STUDIES: No results found.    COMPARISON: Abdominal ultrasound November 05, 2021   TECHNIQUE:  Real time limited abdomen ultrasound was performed.   IMAGE QUALITY: Satisfactory, of diagnostic value.   FINDINGS:   Limited sonographic views of the abdomen demonstrates mild abdominal ascites. There is no large pocket to safely drain.     IMPRESSION:  IMPRESSION:  1. Mild abdominal ascites.  2. No large enough pocket to safely drain.    ASSESSMENT: 68 y.o. [place] woman presenting June 2011 with stage IV breast cancer involving the right breast upper outer quadrant, right axilla, mediastinal lymph nodes, and both lungs, but not the brain, liver, or bones  (1) positive right breast and right axillary lymph node biopsies 07/17/2010 of a clinical T4 N2 M1 invasive ductal carcinoma, grade 1, estrogen receptor 100% positive, progesterone receptor 6% positive, with an MIB-1 of 12%, and no HER-2 amplification.  (2) participated in Phase II tessetaxel study, receiving 2 cycles complicated by thrombocytopenia, anemia requiring transfusion, transaminase elevation, and afebrile neutropenia. Off-study as of September 2011. Chest CT scan September 2011 did show evidence of response.  (3) on letrozole  as of October 2011, with continuing response and good tolerance   (4) 06/02/2012 underwent right mastectomy and axillary lymph node sampling (3 lymph nodes removed, all with viable cancer as well as evidence of treatment effect) for a ypT2 ypN1-2 invasive ductal carcinoma, grade 1, 98% estrogen receptor positive, progesterone receptor negative, with no HER-2 amplification  (5) left jugular vein DVT documented 06/16/2012; left sided Port-A-Cath removed mid July 2015; Coumadin  stopped 09/07/2014.  (a) Left brachiocephalic  v. slightly narrowed, with some Left lower neck collateralization noted on chest CT December 2015  (6) osteoporosis, was on alendronate , switched to denosumab /Prolia  11/07/2016  (a) bone density in 10/2018 shows osteopenia T score of -2.2  (7) portal hypertension, with splenomegaly, gastric varices, and intestinal edema  (a)  mild persistent leukopenia and thrombocytopenia likely due to splenomegaly  (b) iron  deficiency secondary to above, status post Feraheme  x2 December 2018  (8) restaging studies:  (a) chest CT  12/26/2017 shows a 4.5 mm left lower lobe pulmonary nodule which is unchanged.  There was no other evidence of active disease  (b) restaging chest CT in 05/2019 shows no change in bilateral pulmonary nodules, prominent mediastinal lymph nodes are stable  (9) CT scan of the abdomen 11/03/2021 shows thrombus in the portal, splenic and superior mesenteric vein  (A) Heparin  started 11/03/2021  10. PET scan 05/24/2022, low metabolic activity of right lower pulmonary nodule. No change in size of CT 02/27/2022, Carcinoma with mucinous features consistent with metastatic breast carcinoma, negative margins, no LN involvement.  Prognostics showed ER +100% strong staining, PR +20% staining, Ki-67 of 10% and HER2 2+ by IHC and negative by FISH  PLAN:  Assessment & Plan  Assessment & Plan  Metastatic breast cancer with no active disease. - Continue Ibrance , Faslodex , and Prolia . - Last imaging with no def evidence of progression  Anemia secondary to chronic liver disease and gastrointestinal blood loss Chronic anemia likely due to gastrointestinal blood loss from portal hypertensive gastropathy and esophageal varices. Hemoglobin stable at 8.1-8.2. Risk of bleeding persists due to vascular ectasia and gastropathy. - Ordered iron  studies to assess for iron  deficiency. - Arrange iron  infusions if iron  levels are low. - Coordinate with gastroenterologist Dr. Saintclair for further evaluation of gastrointestinal bleeding. - Arrange blood transfusion if hemoglobin drops significantly.  Decompensated liver cirrhosis with portal hypertension, ascites, esophageal varices, and portal hypertensive gastropathy - Arranged paracentesis to drain abdominal fluid. - Coordinated with gastroenterologist Dr. Saintclair for ongoing management of liver disease.  Depressive disorder Depression possibly related to liver disease and functional limitations. Currently on Effexor . Symptoms may be exacerbated by anemia and fluid  retention. - Continue Effexor  for depression management.  Total time: 30 min  *Total Encounter Time as defined by the Centers for Medicare and Medicaid Services includes, in addition to the face-to-face time of a patient visit (documented in the note above) non-face-to-face time: obtaining and reviewing outside history, ordering and reviewing medications, tests or procedures, care coordination (communications with other health care professionals or caregivers) and documentation in the medical record.

## 2024-11-04 ENCOUNTER — Other Ambulatory Visit: Payer: Self-pay | Admitting: *Deleted

## 2024-11-04 DIAGNOSIS — D500 Iron deficiency anemia secondary to blood loss (chronic): Secondary | ICD-10-CM

## 2024-11-05 ENCOUNTER — Inpatient Hospital Stay

## 2024-11-05 DIAGNOSIS — D509 Iron deficiency anemia, unspecified: Secondary | ICD-10-CM

## 2024-11-05 DIAGNOSIS — D649 Anemia, unspecified: Secondary | ICD-10-CM

## 2024-11-05 DIAGNOSIS — Z5111 Encounter for antineoplastic chemotherapy: Secondary | ICD-10-CM | POA: Diagnosis not present

## 2024-11-05 DIAGNOSIS — D500 Iron deficiency anemia secondary to blood loss (chronic): Secondary | ICD-10-CM

## 2024-11-05 DIAGNOSIS — C50411 Malignant neoplasm of upper-outer quadrant of right female breast: Secondary | ICD-10-CM

## 2024-11-05 LAB — CBC WITH DIFFERENTIAL/PLATELET
Abs Immature Granulocytes: 0.03 K/uL (ref 0.00–0.07)
Basophils Absolute: 0.1 K/uL (ref 0.0–0.1)
Basophils Relative: 1 %
Eosinophils Absolute: 0.1 K/uL (ref 0.0–0.5)
Eosinophils Relative: 2 %
HCT: 26.2 % — ABNORMAL LOW (ref 36.0–46.0)
Hemoglobin: 8.2 g/dL — ABNORMAL LOW (ref 12.0–15.0)
Immature Granulocytes: 1 %
Lymphocytes Relative: 10 %
Lymphs Abs: 0.4 K/uL — ABNORMAL LOW (ref 0.7–4.0)
MCH: 36.8 pg — ABNORMAL HIGH (ref 26.0–34.0)
MCHC: 31.3 g/dL (ref 30.0–36.0)
MCV: 117.5 fL — ABNORMAL HIGH (ref 80.0–100.0)
Monocytes Absolute: 0.4 K/uL (ref 0.1–1.0)
Monocytes Relative: 10 %
Neutro Abs: 2.8 K/uL (ref 1.7–7.7)
Neutrophils Relative %: 76 %
Platelets: 173 K/uL (ref 150–400)
RBC: 2.23 MIL/uL — ABNORMAL LOW (ref 3.87–5.11)
RDW: 18.8 % — ABNORMAL HIGH (ref 11.5–15.5)
WBC: 3.7 K/uL — ABNORMAL LOW (ref 4.0–10.5)
nRBC: 0 % (ref 0.0–0.2)

## 2024-11-05 LAB — VITAMIN B12: Vitamin B-12: 1502 pg/mL — ABNORMAL HIGH (ref 180–914)

## 2024-11-05 LAB — PREPARE RBC (CROSSMATCH)

## 2024-11-06 ENCOUNTER — Inpatient Hospital Stay

## 2024-11-06 DIAGNOSIS — Z5111 Encounter for antineoplastic chemotherapy: Secondary | ICD-10-CM | POA: Diagnosis not present

## 2024-11-06 DIAGNOSIS — D500 Iron deficiency anemia secondary to blood loss (chronic): Secondary | ICD-10-CM

## 2024-11-06 MED ORDER — DIPHENHYDRAMINE HCL 25 MG PO CAPS
25.0000 mg | ORAL_CAPSULE | Freq: Once | ORAL | Status: AC
  Administered 2024-11-06: 25 mg via ORAL
  Filled 2024-11-06: qty 1

## 2024-11-06 MED ORDER — SODIUM CHLORIDE 0.9% IV SOLUTION
250.0000 mL | INTRAVENOUS | Status: DC
  Administered 2024-11-06: 100 mL via INTRAVENOUS

## 2024-11-06 MED ORDER — ACETAMINOPHEN 325 MG PO TABS
650.0000 mg | ORAL_TABLET | Freq: Once | ORAL | Status: AC
  Administered 2024-11-06: 650 mg via ORAL
  Filled 2024-11-06: qty 2

## 2024-11-06 NOTE — Patient Instructions (Signed)
 Blood Transfusion, Adult A blood transfusion is a procedure in which you receive blood or a type of blood cell (blood component) through an IV. You may need a blood transfusion when you have a low blood count, which is a low number of any blood cell. This may result from a bleeding disorder, illness, injury, or surgery. The blood may come from a donor, or you may be able to have your own blood collected and stored (autologous blood donation) before a planned surgery. The blood given in a transfusion may be made up of different blood components. You may receive: Red blood cells. These carry oxygen to the cells in the body. Platelets. These help your blood to clot. Plasma. This is the liquid part of your blood. It carries proteins and other substances throughout the body. White blood cells. These help you fight infections. If you have hemophilia or another clotting disorder, you may also receive other types of blood products. Depending on the type of blood product, this procedure may take 1-4 hours to complete. Tell a health care provider about: Any bleeding problems you have. Any previous reactions you have had during a blood transfusion. Any allergies you have. All medicines you are taking, including vitamins, herbs, eye drops, creams, and over-the-counter medicines. Any surgeries you have had. Any medical conditions you have. Whether you are pregnant or may be pregnant. What are the risks? Talk with your health care provider about risks. The most common problems include: A mild allergic reaction, such as red, swollen areas of skin (hives) and itching. Fever or chills. This may be the body's response to new blood cells received. This may occur during or up to 4 hours after the transfusion. More serious problems may include: A serious allergic reaction that causes difficulty breathing or swelling around the face and lips. Transfusion-associated circulatory overload (TACO), or too much fluid in  the lungs. This may cause breathing problems. Transfusion-related acute lung injury (TRALI), which causes breathing difficulty and low oxygen in the blood. This can occur within hours of the transfusion or several days later. Iron overload. This can happen after receiving many blood transfusions over a period of time. Infection or virus being transmitted. This is rare because donated blood is carefully tested before it is given. Hemolytic transfusion reaction. This is rare. It happens when the body's defense system (immune system)tries to attack the new blood cells. Symptoms may include fever, chills, nausea, low blood pressure, and low back or chest pain. Transfusion-associated graft-versus-host disease (TAGVHD). This is rare. It happens when donated cells attack the body's healthy tissues. What happens before the procedure? You will have a blood test to check your blood type. This test is done to know what kind of blood your body will accept and to match it to the donor blood. If you are going to have a planned surgery, you may be able to do an autologous blood donation. This may be done in case you need to have a transfusion. You will have your temperature, blood pressure, and pulse checked before the transfusion. If you have had an allergic reaction to a transfusion in the past, you may be given medicine to help prevent a reaction. This medicine may be given to you by mouth (orally) or through an IV. What happens during the procedure?  An IV will be inserted into one of your veins. The bag of blood will be attached to your IV. The blood will then enter through your vein. Your temperature, blood pressure,  and pulse will be monitored during the transfusion. This monitoring is done to detect early signs of a transfusion reaction. Tell your nurse right away if you have any of these symptoms during the transfusion: Shortness of breath or trouble breathing. Chest or back pain. Fever or  chills. Itching or hives. If you have any signs or symptoms of a reaction, your transfusion will be stopped and you may be given medicine. When the transfusion is complete, your IV will be removed. Pressure may be applied to the IV site for a few minutes. A bandage (dressing)will be applied. The procedure may vary among health care providers and hospitals. What happens after the procedure? Your temperature, blood pressure, pulse, breathing rate, and blood oxygen level will be monitored until you leave the hospital or clinic. Your blood may be tested to see how you have responded to the transfusion. You may be warmed with fluids or blankets to maintain a normal body temperature. If you receive your blood transfusion in an outpatient setting, you will be told whom to contact to report any reactions. Where to find more information Visit the [employer]: redcross.org Summary A blood transfusion is a procedure in which you receive blood or a type of blood cell (blood component) through an IV. The blood given in a transfusion may be made up of different blood components. You may receive red blood cells, platelets, plasma, or white blood cells depending on the condition treated. Your temperature, blood pressure, and pulse will be monitored before, during, and after the transfusion. After the transfusion, your blood may be tested to see how your body has responded. This information is not intended to replace advice given to you by your health care provider. Make sure you discuss any questions you have with your health care provider. Document Revised: 03/15/2022 Document Reviewed: 03/15/2022 Elsevier Patient Education  2024 [employer].

## 2024-11-08 ENCOUNTER — Telehealth: Payer: Self-pay

## 2024-11-08 ENCOUNTER — Encounter: Payer: Self-pay | Admitting: Hematology and Oncology

## 2024-11-08 ENCOUNTER — Other Ambulatory Visit (HOSPITAL_COMMUNITY): Payer: Self-pay

## 2024-11-08 LAB — TYPE AND SCREEN
ABO/RH(D): O POS
Antibody Screen: POSITIVE
Donor AG Type: NEGATIVE
Unit division: 0

## 2024-11-08 LAB — BPAM RBC
Blood Product Expiration Date: 202512052359
ISSUE DATE / TIME: 202511080905
Unit Type and Rh: 5100

## 2024-11-08 NOTE — Telephone Encounter (Signed)
 Oral Oncology Patient Advocate Encounter  Was successful in securing patient a $7500 grant from [facility] to provide copayment coverage for Ibrance .  This will keep the out of pocket expense at $0.     [employer] ID: [id number]   The billing information is as follows and has been shared with [facility].    RxBin: [id number] PCN: [id number] Member ID: [id number] Group ID: [id number] Dates of Eligibility: 10/05/24 through 10/04/25  Fund:  breast  Lucie Lamer, CPhT [place]  [facility] On[facility] Pharmacy Patient Advocate Specialist II [place] Phone: [phone]  Fax: [phone] [email or web address]

## 2024-11-09 ENCOUNTER — Ambulatory Visit (HOSPITAL_COMMUNITY)
Admission: RE | Admit: 2024-11-09 | Discharge: 2024-11-09 | Disposition: A | Discharge status: Home / Self Care | Source: Ambulatory Visit | Attending: Hematology and Oncology | Admitting: Hematology and Oncology

## 2024-11-09 DIAGNOSIS — R188 Other ascites: Secondary | ICD-10-CM | POA: Insufficient documentation

## 2024-11-09 DIAGNOSIS — Z853 Personal history of malignant neoplasm of breast: Secondary | ICD-10-CM | POA: Diagnosis not present

## 2024-11-09 DIAGNOSIS — K7460 Unspecified cirrhosis of liver: Secondary | ICD-10-CM | POA: Insufficient documentation

## 2024-11-09 LAB — BODY FLUID CELL COUNT WITH DIFFERENTIAL
Eos, Fluid: 0 %
Lymphs, Fluid: 56 %
Monocyte-Macrophage-Serous Fluid: 43 % — ABNORMAL LOW (ref 50–90)
Neutrophil Count, Fluid: 1 % (ref 0–25)
Total Nucleated Cell Count, Fluid: 107 uL (ref 0–1000)

## 2024-11-09 LAB — LACTATE DEHYDROGENASE, PLEURAL OR PERITONEAL FLUID: LD, Fluid: 47 U/L — ABNORMAL HIGH (ref 3–23)

## 2024-11-09 LAB — GLUCOSE, PLEURAL OR PERITONEAL FLUID: Glucose, Fluid: 106 mg/dL

## 2024-11-09 MED ORDER — LIDOCAINE-EPINEPHRINE (PF) 2 %-1:200000 IJ SOLN
INTRAMUSCULAR | Status: AC
  Filled 2024-11-09: qty 20

## 2024-11-10 ENCOUNTER — Other Ambulatory Visit: Payer: Self-pay

## 2024-11-10 DIAGNOSIS — K7290 Hepatic failure, unspecified without coma: Secondary | ICD-10-CM

## 2024-11-10 DIAGNOSIS — R634 Abnormal weight loss: Secondary | ICD-10-CM

## 2024-11-11 ENCOUNTER — Other Ambulatory Visit: Payer: Self-pay | Admitting: Hematology and Oncology

## 2024-11-11 ENCOUNTER — Other Ambulatory Visit: Payer: Self-pay | Admitting: *Deleted

## 2024-11-11 ENCOUNTER — Other Ambulatory Visit: Payer: Self-pay

## 2024-11-11 DIAGNOSIS — D696 Thrombocytopenia, unspecified: Secondary | ICD-10-CM

## 2024-11-11 DIAGNOSIS — D500 Iron deficiency anemia secondary to blood loss (chronic): Secondary | ICD-10-CM

## 2024-11-11 DIAGNOSIS — Z170 Estrogen receptor positive status [ER+]: Secondary | ICD-10-CM

## 2024-11-11 MED ORDER — PALBOCICLIB 75 MG PO TABS
75.0000 mg | ORAL_TABLET | Freq: Every day | ORAL | 3 refills | Status: AC
  Filled 2024-11-11: qty 21, 28d supply, fill #0
  Filled 2024-12-13: qty 21, 28d supply, fill #1
  Filled 2025-01-10: qty 21, 28d supply, fill #2
  Filled 2025-02-04: qty 21, 28d supply, fill #3

## 2024-11-12 LAB — CYTOLOGY - NON PAP

## 2024-11-14 ENCOUNTER — Encounter (INDEPENDENT_AMBULATORY_CARE_PROVIDER_SITE_OTHER): Payer: Self-pay

## 2024-11-15 ENCOUNTER — Inpatient Hospital Stay

## 2024-11-15 ENCOUNTER — Other Ambulatory Visit: Payer: Self-pay

## 2024-11-15 ENCOUNTER — Telehealth: Payer: Self-pay | Admitting: Hematology and Oncology

## 2024-11-15 NOTE — Telephone Encounter (Signed)
 Patient called in stating she needed to reschedule appointments from 11/15/2024 to 11/17/2024.

## 2024-11-15 NOTE — Progress Notes (Signed)
 Specialty Pharmacy Refill Coordination Note  Nicole [name] is a 68 y.o. female contacted today regarding refills of specialty medication(s) Palbociclib  (IBRANCE )   Patient requested (Patient-Rptd) Delivery   Delivery date: 11/17/24   Verified address: [address]   [phone]   Medication will be filled on: 11/16/24

## 2024-11-17 ENCOUNTER — Inpatient Hospital Stay

## 2024-11-17 VITALS — BP 131/71 | HR 91 | Resp 17

## 2024-11-17 DIAGNOSIS — D696 Thrombocytopenia, unspecified: Secondary | ICD-10-CM

## 2024-11-17 DIAGNOSIS — C50411 Malignant neoplasm of upper-outer quadrant of right female breast: Secondary | ICD-10-CM

## 2024-11-17 DIAGNOSIS — Z5111 Encounter for antineoplastic chemotherapy: Secondary | ICD-10-CM | POA: Diagnosis not present

## 2024-11-17 DIAGNOSIS — D509 Iron deficiency anemia, unspecified: Secondary | ICD-10-CM

## 2024-11-17 DIAGNOSIS — D649 Anemia, unspecified: Secondary | ICD-10-CM

## 2024-11-17 DIAGNOSIS — D500 Iron deficiency anemia secondary to blood loss (chronic): Secondary | ICD-10-CM

## 2024-11-17 DIAGNOSIS — M810 Age-related osteoporosis without current pathological fracture: Secondary | ICD-10-CM

## 2024-11-17 DIAGNOSIS — C50911 Malignant neoplasm of unspecified site of right female breast: Secondary | ICD-10-CM

## 2024-11-17 LAB — CMP (CANCER CENTER ONLY)
ALT: 12 U/L (ref 0–44)
AST: 21 U/L (ref 15–41)
Albumin: 4 g/dL (ref 3.5–5.0)
Alkaline Phosphatase: 66 U/L (ref 38–126)
Anion gap: 13 (ref 5–15)
BUN: 20 mg/dL (ref 8–23)
CO2: 22 mmol/L (ref 22–32)
Calcium: 9 mg/dL (ref 8.9–10.3)
Chloride: 102 mmol/L (ref 98–111)
Creatinine: 1.38 mg/dL — ABNORMAL HIGH (ref 0.44–1.00)
GFR, Estimated: 41 mL/min — ABNORMAL LOW (ref >60)
Glucose, Bld: 91 mg/dL (ref 70–99)
Potassium: 4.4 mmol/L (ref 3.5–5.1)
Sodium: 138 mmol/L (ref 135–145)
Total Bilirubin: 0.7 mg/dL (ref 0.0–1.2)
Total Protein: 6.5 g/dL (ref 6.5–8.1)

## 2024-11-17 LAB — SAMPLE TO BLOOD BANK

## 2024-11-17 LAB — CBC WITH DIFFERENTIAL/PLATELET
Abs Immature Granulocytes: 0.01 K/uL (ref 0.00–0.07)
Basophils Absolute: 0 K/uL (ref 0.0–0.1)
Basophils Relative: 2 %
Eosinophils Absolute: 0.1 K/uL (ref 0.0–0.5)
Eosinophils Relative: 3 %
HCT: 28.2 % — ABNORMAL LOW (ref 36.0–46.0)
Hemoglobin: 9.2 g/dL — ABNORMAL LOW (ref 12.0–15.0)
Immature Granulocytes: 1 %
Lymphocytes Relative: 12 %
Lymphs Abs: 0.2 K/uL — ABNORMAL LOW (ref 0.7–4.0)
MCH: 37.7 pg — ABNORMAL HIGH (ref 26.0–34.0)
MCHC: 32.6 g/dL (ref 30.0–36.0)
MCV: 115.6 fL — ABNORMAL HIGH (ref 80.0–100.0)
Monocytes Absolute: 0.2 K/uL (ref 0.1–1.0)
Monocytes Relative: 8 %
Neutro Abs: 1.5 K/uL — ABNORMAL LOW (ref 1.7–7.7)
Neutrophils Relative %: 74 %
Platelets: 133 K/uL — ABNORMAL LOW (ref 150–400)
RBC: 2.44 MIL/uL — ABNORMAL LOW (ref 3.87–5.11)
RDW: 19.2 % — ABNORMAL HIGH (ref 11.5–15.5)
WBC: 2 K/uL — ABNORMAL LOW (ref 4.0–10.5)
nRBC: 0 % (ref 0.0–0.2)

## 2024-11-17 LAB — PROTIME-INR
INR: 1.4 — ABNORMAL HIGH (ref 0.8–1.2)
Prothrombin Time: 18.2 s — ABNORMAL HIGH (ref 11.4–15.2)

## 2024-11-17 MED ORDER — FULVESTRANT 250 MG/5ML IM SOSY
500.0000 mg | PREFILLED_SYRINGE | Freq: Once | INTRAMUSCULAR | Status: AC
  Administered 2024-11-17: 500 mg via INTRAMUSCULAR
  Filled 2024-11-17: qty 10

## 2024-11-18 ENCOUNTER — Ambulatory Visit
Admission: RE | Admit: 2024-11-18 | Discharge: 2024-11-18 | Disposition: A | Discharge status: Home / Self Care | Source: Ambulatory Visit

## 2024-11-18 ENCOUNTER — Encounter: Payer: Self-pay | Admitting: Hematology and Oncology

## 2024-11-18 DIAGNOSIS — K7290 Hepatic failure, unspecified without coma: Secondary | ICD-10-CM

## 2024-11-18 DIAGNOSIS — R634 Abnormal weight loss: Secondary | ICD-10-CM

## 2024-11-18 MED ORDER — IOPAMIDOL (ISOVUE-370) INJECTION 76%
80.0000 mL | Freq: Once | INTRAVENOUS | Status: AC | PRN
  Administered 2024-11-18: 80 mL via INTRAVENOUS

## 2024-11-26 ENCOUNTER — Other Ambulatory Visit (HOSPITAL_COMMUNITY): Payer: Self-pay

## 2024-11-26 DIAGNOSIS — R188 Other ascites: Secondary | ICD-10-CM

## 2024-11-30 ENCOUNTER — Other Ambulatory Visit: Payer: Self-pay

## 2024-11-30 ENCOUNTER — Inpatient Hospital Stay: Admitting: Hematology and Oncology

## 2024-11-30 ENCOUNTER — Inpatient Hospital Stay: Attending: Hematology and Oncology

## 2024-11-30 ENCOUNTER — Telehealth: Payer: Self-pay | Admitting: Hematology and Oncology

## 2024-11-30 MED ORDER — LACTULOSE 20 GM/30ML PO SOLN: 20.0000 g | Freq: Two times a day (BID) | ORAL | 0 refills | Status: AC

## 2024-11-30 NOTE — Telephone Encounter (Signed)
 Patient's spouse called in stating patient will be unable to make lab and appointment with Dr. Loretha on 11/30/2024. Appointments have been cancelled per their request.

## 2024-12-01 ENCOUNTER — Telehealth: Payer: Self-pay | Admitting: *Deleted

## 2024-12-01 NOTE — Telephone Encounter (Signed)
 Called pt husband with message below. Husband that's that pt has not had a bowel movement as of yet but she is having gas. Recommended that pt increases fluid intake, drink warm liquids, ambulate if possible, and use a protected heating pad. Pt husband verbalized understanding.

## 2024-12-01 NOTE — Telephone Encounter (Signed)
-----   Message from [place] sent at 11/30/2024  7:13 PM EST ----- She shouldn't need more than 2 doses of lactulose. Please ask her to stop taking it once she has a good bowel movement. Can we edit the order to PRN BID with instructions to stop after she has a good bowel movement.

## 2024-12-03 ENCOUNTER — Ambulatory Visit (HOSPITAL_COMMUNITY): Admission: RE | Admit: 2024-12-03 | Source: Ambulatory Visit

## 2024-12-03 ENCOUNTER — Encounter (HOSPITAL_COMMUNITY): Payer: Self-pay

## 2024-12-06 ENCOUNTER — Telehealth: Payer: Self-pay | Admitting: *Deleted

## 2024-12-06 NOTE — Telephone Encounter (Signed)
 Called pt to f/u with husband's request to speak with pt about pt condition and what she may require. Pt stated that her abdomen feels full and that she may need a paracentesis. Pt also has some generalized weakness and husband is moving her around with computer chair to maneuver to and from the bathroom. Advised pt that there may be some underlining issues that is causing the generalized weakness and that a visit to the ER may be necessary in order to be evaluated further. Pt declined for now but stated that she will reschedule appt that was cancelled last week for paracentesis and would like an appt for lab draw to evaluate hgb. Appt for labs have been scheduled and pt will call to f/u on scheduling paracentesis.

## 2024-12-07 ENCOUNTER — Telehealth: Payer: Self-pay

## 2024-12-07 ENCOUNTER — Other Ambulatory Visit: Payer: Self-pay

## 2024-12-07 ENCOUNTER — Inpatient Hospital Stay: Attending: Hematology and Oncology

## 2024-12-07 ENCOUNTER — Encounter (HOSPITAL_COMMUNITY): Payer: Self-pay

## 2024-12-07 ENCOUNTER — Inpatient Hospital Stay (HOSPITAL_COMMUNITY)
Admission: EM | Admit: 2024-12-07 | Discharge: 2024-12-11 | DRG: 377 | Disposition: A | Attending: Internal Medicine | Admitting: Internal Medicine

## 2024-12-07 ENCOUNTER — Inpatient Hospital Stay (HOSPITAL_COMMUNITY): Admission: RE | Admit: 2024-12-07 | Discharge: 2024-12-07

## 2024-12-07 ENCOUNTER — Encounter (HOSPITAL_COMMUNITY): Payer: Self-pay | Admitting: Internal Medicine

## 2024-12-07 DIAGNOSIS — F32A Depression, unspecified: Secondary | ICD-10-CM | POA: Diagnosis not present

## 2024-12-07 DIAGNOSIS — K922 Gastrointestinal hemorrhage, unspecified: Secondary | ICD-10-CM | POA: Diagnosis present

## 2024-12-07 DIAGNOSIS — C50919 Malignant neoplasm of unspecified site of unspecified female breast: Secondary | ICD-10-CM | POA: Diagnosis present

## 2024-12-07 DIAGNOSIS — Z66 Do not resuscitate: Secondary | ICD-10-CM | POA: Diagnosis present

## 2024-12-07 DIAGNOSIS — D649 Anemia, unspecified: Secondary | ICD-10-CM | POA: Diagnosis present

## 2024-12-07 DIAGNOSIS — Z825 Family history of asthma and other chronic lower respiratory diseases: Secondary | ICD-10-CM | POA: Diagnosis not present

## 2024-12-07 DIAGNOSIS — C778 Secondary and unspecified malignant neoplasm of lymph nodes of multiple regions: Secondary | ICD-10-CM | POA: Insufficient documentation

## 2024-12-07 DIAGNOSIS — Z79818 Long term (current) use of other agents affecting estrogen receptors and estrogen levels: Secondary | ICD-10-CM | POA: Diagnosis not present

## 2024-12-07 DIAGNOSIS — K766 Portal hypertension: Secondary | ICD-10-CM | POA: Insufficient documentation

## 2024-12-07 DIAGNOSIS — R188 Other ascites: Secondary | ICD-10-CM | POA: Insufficient documentation

## 2024-12-07 DIAGNOSIS — I8500 Esophageal varices without bleeding: Secondary | ICD-10-CM | POA: Diagnosis present

## 2024-12-07 DIAGNOSIS — D62 Acute posthemorrhagic anemia: Secondary | ICD-10-CM | POA: Diagnosis present

## 2024-12-07 DIAGNOSIS — Z5111 Encounter for antineoplastic chemotherapy: Secondary | ICD-10-CM | POA: Insufficient documentation

## 2024-12-07 DIAGNOSIS — Z170 Estrogen receptor positive status [ER+]: Secondary | ICD-10-CM | POA: Insufficient documentation

## 2024-12-07 DIAGNOSIS — R161 Splenomegaly, not elsewhere classified: Secondary | ICD-10-CM | POA: Insufficient documentation

## 2024-12-07 DIAGNOSIS — Z7901 Long term (current) use of anticoagulants: Secondary | ICD-10-CM | POA: Insufficient documentation

## 2024-12-07 DIAGNOSIS — K7460 Unspecified cirrhosis of liver: Secondary | ICD-10-CM | POA: Diagnosis present

## 2024-12-07 DIAGNOSIS — C7800 Secondary malignant neoplasm of unspecified lung: Secondary | ICD-10-CM | POA: Diagnosis not present

## 2024-12-07 DIAGNOSIS — D509 Iron deficiency anemia, unspecified: Secondary | ICD-10-CM | POA: Diagnosis present

## 2024-12-07 DIAGNOSIS — C7801 Secondary malignant neoplasm of right lung: Secondary | ICD-10-CM | POA: Diagnosis present

## 2024-12-07 DIAGNOSIS — I739 Peripheral vascular disease, unspecified: Secondary | ICD-10-CM | POA: Diagnosis present

## 2024-12-07 DIAGNOSIS — Z8711 Personal history of peptic ulcer disease: Secondary | ICD-10-CM | POA: Diagnosis not present

## 2024-12-07 DIAGNOSIS — K449 Diaphragmatic hernia without obstruction or gangrene: Secondary | ICD-10-CM | POA: Diagnosis present

## 2024-12-07 DIAGNOSIS — C50411 Malignant neoplasm of upper-outer quadrant of right female breast: Secondary | ICD-10-CM | POA: Insufficient documentation

## 2024-12-07 DIAGNOSIS — E611 Iron deficiency: Secondary | ICD-10-CM | POA: Insufficient documentation

## 2024-12-07 DIAGNOSIS — Z902 Acquired absence of lung [part of]: Secondary | ICD-10-CM

## 2024-12-07 DIAGNOSIS — Z86718 Personal history of other venous thrombosis and embolism: Secondary | ICD-10-CM | POA: Insufficient documentation

## 2024-12-07 DIAGNOSIS — G2581 Restless legs syndrome: Secondary | ICD-10-CM | POA: Diagnosis present

## 2024-12-07 DIAGNOSIS — M19042 Primary osteoarthritis, left hand: Secondary | ICD-10-CM | POA: Diagnosis present

## 2024-12-07 DIAGNOSIS — Z79899 Other long term (current) drug therapy: Secondary | ICD-10-CM | POA: Diagnosis not present

## 2024-12-07 DIAGNOSIS — D500 Iron deficiency anemia secondary to blood loss (chronic): Secondary | ICD-10-CM

## 2024-12-07 DIAGNOSIS — Z853 Personal history of malignant neoplasm of breast: Secondary | ICD-10-CM | POA: Diagnosis not present

## 2024-12-07 DIAGNOSIS — Z87442 Personal history of urinary calculi: Secondary | ICD-10-CM | POA: Diagnosis not present

## 2024-12-07 DIAGNOSIS — K259 Gastric ulcer, unspecified as acute or chronic, without hemorrhage or perforation: Secondary | ICD-10-CM | POA: Diagnosis not present

## 2024-12-07 DIAGNOSIS — C50911 Malignant neoplasm of unspecified site of right female breast: Secondary | ICD-10-CM

## 2024-12-07 DIAGNOSIS — I81 Portal vein thrombosis: Secondary | ICD-10-CM | POA: Insufficient documentation

## 2024-12-07 DIAGNOSIS — K254 Chronic or unspecified gastric ulcer with hemorrhage: Secondary | ICD-10-CM | POA: Diagnosis present

## 2024-12-07 DIAGNOSIS — K5900 Constipation, unspecified: Secondary | ICD-10-CM | POA: Diagnosis present

## 2024-12-07 DIAGNOSIS — M810 Age-related osteoporosis without current pathological fracture: Secondary | ICD-10-CM | POA: Insufficient documentation

## 2024-12-07 DIAGNOSIS — Z1732 Human epidermal growth factor receptor 2 negative status: Secondary | ICD-10-CM | POA: Insufficient documentation

## 2024-12-07 DIAGNOSIS — I8511 Secondary esophageal varices with bleeding: Secondary | ICD-10-CM | POA: Diagnosis present

## 2024-12-07 DIAGNOSIS — C7802 Secondary malignant neoplasm of left lung: Secondary | ICD-10-CM | POA: Insufficient documentation

## 2024-12-07 DIAGNOSIS — Z79620 Long term (current) use of immunosuppressive biologic: Secondary | ICD-10-CM | POA: Diagnosis not present

## 2024-12-07 DIAGNOSIS — I10 Essential (primary) hypertension: Secondary | ICD-10-CM | POA: Diagnosis present

## 2024-12-07 DIAGNOSIS — Z1721 Progesterone receptor positive status: Secondary | ICD-10-CM | POA: Insufficient documentation

## 2024-12-07 LAB — CBC WITH DIFFERENTIAL (CANCER CENTER ONLY)
Abs Immature Granulocytes: 0.27 K/uL — ABNORMAL HIGH (ref 0.00–0.07)
Basophils Absolute: 0 K/uL (ref 0.0–0.1)
Basophils Relative: 0 %
Eosinophils Absolute: 0.1 K/uL (ref 0.0–0.5)
Eosinophils Relative: 1 %
HCT: 18.7 % — ABNORMAL LOW (ref 36.0–46.0)
Hemoglobin: 5.6 g/dL — CL (ref 12.0–15.0)
Immature Granulocytes: 5 %
Lymphocytes Relative: 8 %
Lymphs Abs: 0.4 K/uL — ABNORMAL LOW (ref 0.7–4.0)
MCH: 34.8 pg — ABNORMAL HIGH (ref 26.0–34.0)
MCHC: 29.9 g/dL — ABNORMAL LOW (ref 30.0–36.0)
MCV: 116.1 fL — ABNORMAL HIGH (ref 80.0–100.0)
Monocytes Absolute: 0.5 K/uL (ref 0.1–1.0)
Monocytes Relative: 10 %
Neutro Abs: 4.2 K/uL (ref 1.7–7.7)
Neutrophils Relative %: 76 %
Platelet Count: 278 K/uL (ref 150–400)
RBC: 1.61 MIL/uL — ABNORMAL LOW (ref 3.87–5.11)
RDW: 20 % — ABNORMAL HIGH (ref 11.5–15.5)
WBC Count: 5.6 K/uL (ref 4.0–10.5)
nRBC: 1.8 % — ABNORMAL HIGH (ref 0.0–0.2)

## 2024-12-07 LAB — CMP (CANCER CENTER ONLY)
ALT: 19 U/L (ref 0–44)
AST: 25 U/L (ref 15–41)
Albumin: 3.5 g/dL (ref 3.5–5.0)
Alkaline Phosphatase: 66 U/L (ref 38–126)
Anion gap: 13 (ref 5–15)
BUN: 14 mg/dL (ref 8–23)
CO2: 20 mmol/L — ABNORMAL LOW (ref 22–32)
Calcium: 8.5 mg/dL — ABNORMAL LOW (ref 8.9–10.3)
Chloride: 104 mmol/L (ref 98–111)
Creatinine: 1.05 mg/dL — ABNORMAL HIGH (ref 0.44–1.00)
GFR, Estimated: 57 mL/min — ABNORMAL LOW (ref >60)
Glucose, Bld: 109 mg/dL — ABNORMAL HIGH (ref 70–99)
Potassium: 3.9 mmol/L (ref 3.5–5.1)
Sodium: 137 mmol/L (ref 135–145)
Total Bilirubin: 0.7 mg/dL (ref 0.0–1.2)
Total Protein: 6.2 g/dL — ABNORMAL LOW (ref 6.5–8.1)

## 2024-12-07 LAB — RETICULOCYTES
Immature Retic Fract: 41.6 % — ABNORMAL HIGH (ref 2.3–15.9)
RBC.: 1.55 MIL/uL — ABNORMAL LOW (ref 3.87–5.11)
Retic Count, Absolute: 148.3 K/uL (ref 19.0–186.0)
Retic Ct Pct: 9.6 % — ABNORMAL HIGH (ref 0.4–3.1)

## 2024-12-07 LAB — VITAMIN B12
Vitamin B-12: 3934 pg/mL — ABNORMAL HIGH (ref 180–914)
Vitamin B-12: 3931 pg/mL — ABNORMAL HIGH (ref 180–914)

## 2024-12-07 LAB — PROTIME-INR
INR: 2.6 — ABNORMAL HIGH (ref 0.8–1.2)
Prothrombin Time: 29.2 s — ABNORMAL HIGH (ref 11.4–15.2)

## 2024-12-07 LAB — IRON AND TIBC
Iron: 33 ug/dL (ref 28–170)
Saturation Ratios: 8 % — ABNORMAL LOW (ref 10.4–31.8)
TIBC: 400 ug/dL (ref 250–450)
UIBC: 367 ug/dL

## 2024-12-07 LAB — APTT: aPTT: 49 s — ABNORMAL HIGH (ref 24–36)

## 2024-12-07 LAB — PREPARE RBC (CROSSMATCH)

## 2024-12-07 LAB — SAMPLE TO BLOOD BANK

## 2024-12-07 LAB — FOLATE: Folate: >20 ng/mL (ref >5.9)

## 2024-12-07 LAB — FERRITIN: Ferritin: 44 ng/mL (ref 11–307)

## 2024-12-07 MED ORDER — GLYCERIN (LAXATIVE) 2 G RE SUPP
1.0000 | Freq: Once | RECTAL | Status: AC
  Administered 2024-12-07: 1 via RECTAL
  Filled 2024-12-07: qty 1

## 2024-12-07 MED ORDER — VENLAFAXINE HCL ER 150 MG PO CP24
150.0000 mg | ORAL_CAPSULE | Freq: Every day | ORAL | Status: AC
  Administered 2024-12-08 – 2024-12-11 (×4): 150 mg via ORAL
  Filled 2024-12-07 (×4): qty 1

## 2024-12-07 MED ORDER — SODIUM CHLORIDE 0.9% FLUSH
3.0000 mL | Freq: Two times a day (BID) | INTRAVENOUS | Status: DC
  Administered 2024-12-08 – 2024-12-11 (×7): 3 mL via INTRAVENOUS

## 2024-12-07 MED ORDER — SODIUM CHLORIDE 0.9% IV SOLUTION: Freq: Once | INTRAVENOUS | Status: AC

## 2024-12-07 MED ORDER — SODIUM CHLORIDE 0.9 % IV SOLN
50.0000 ug/h | INTRAVENOUS | Status: AC
  Administered 2024-12-07 – 2024-12-10 (×7): 50 ug/h via INTRAVENOUS
  Filled 2024-12-07 (×8): qty 1

## 2024-12-07 MED ORDER — TRAMADOL HCL 50 MG PO TABS
50.0000 mg | ORAL_TABLET | Freq: Once | ORAL | Status: AC
  Administered 2024-12-07: 50 mg via ORAL
  Filled 2024-12-07: qty 1

## 2024-12-07 MED ORDER — ONDANSETRON HCL 4 MG PO TABS: 4.0000 mg | ORAL_TABLET | Freq: Four times a day (QID) | ORAL | Status: DC | PRN

## 2024-12-07 MED ORDER — OCTREOTIDE LOAD VIA INFUSION
50.0000 ug | Freq: Once | INTRAVENOUS | Status: AC
  Administered 2024-12-07: 50 ug via INTRAVENOUS
  Filled 2024-12-07: qty 25

## 2024-12-07 MED ORDER — PANTOPRAZOLE SODIUM 40 MG IV SOLR
40.0000 mg | Freq: Once | INTRAVENOUS | Status: AC
  Administered 2024-12-07: 40 mg via INTRAVENOUS
  Filled 2024-12-07: qty 10

## 2024-12-07 MED ORDER — ONDANSETRON HCL 4 MG/2ML IJ SOLN: 4.0000 mg | Freq: Four times a day (QID) | INTRAMUSCULAR | Status: DC | PRN

## 2024-12-07 NOTE — H&P (Signed)
 History and Physical    Patient: Nicole [name] FMW:[id number] DOB: 22-Jul-1956 DOA: 12/07/2024 DOS: the patient was seen and examined on 12/07/2024 PCP: [facility] [place]  Patient coming from: Home  Chief Complaint:  Chief Complaint  Patient presents with   Abnormal Lab   HPI: Nicole [name] is a 68 y.o. 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  She has ascites and periodic paracenteses.  She also intermittently requires iron  infusions and blood transfusions.  The patient has been feeling more weak and short of breath than usual and the family called and requested blood work.  She was actually at the hospital for a scheduled paracentesis when her husband got the call to go to the ED because her blood work revealed hemoglobin of 5.9.  The patient has not seen any blood in her stool or urine and has not vomited any blood or had any nosebleeds.  In the emergency department the patient was hemodynamically stable.  2 units of packed red blood cells were ordered for her hemoglobin of 5.6 and the hospitalist were called to admit the patient.   Review of Systems: As mentioned in the history of present illness. All other systems reviewed and are negative. Past Medical History:  Diagnosis Date   Anemia    low iron    Arthritis    fingers   Blood transfusion 2012   Breast CA (HCC)    breast ca dx 06/2010, stage 4, right , Lung Metasis.Surgery, Chemo, Radiation   Breast cancer (HCC)    Clot    jugular  ?2015   Depression    Esophageal varices (HCC)    Headache    Mirgraine- rare since menopause   Heart murmur    mild, no cardiologist, from birth   Hematemesis 08/2017   History of kidney stones    no pain- shows in CT- small 1 mm bilaterally- kidney   Hypertension    IBD (inflammatory bowel disease)     resolved   Idiopathic cirrhosis (HCC)    pt denies   Peripheral vascular disease few yrs ago   clot in right jugular   Portal hypertension (HCC)    Radiation 07/21/12-09/07/12   5940 cGy   Restless legs    Past Surgical History:  Procedure Laterality Date   BIOPSY  08/21/2020   Procedure: BIOPSY;  Surgeon: Saintclair Jasper, MD;  Location: [facility];  Service: Gastroenterology;;   COLONOSCOPY  2016   DILATION AND CURETTAGE OF UTERUS  2004?   ESOPHAGEAL BANDING  09/04/2017   Procedure: ESOPHAGEAL BANDING;  Surgeon: Saintclair Jasper, MD;  Location: [facility] ENDOSCOPY;  Service: Gastroenterology;;   ESOPHAGEAL BANDING  11/27/2017   Procedure: ESOPHAGEAL BANDING;  Surgeon: Saintclair Jasper, MD;  Location: [facility] ENDOSCOPY;  Service: Gastroenterology;;   ESOPHAGEAL BANDING N/A 04/28/2018   Procedure: ESOPHAGEAL BANDING;  Surgeon: Saintclair Jasper, MD;  Location: [facility];  Service: Gastroenterology;  Laterality: N/A;   ESOPHAGEAL BANDING  11/17/2018   Procedure: ESOPHAGEAL BANDING;  Surgeon: Saintclair Jasper, MD;  Location: [facility];  Service: Gastroenterology;;   ESOPHAGEAL BANDING  08/16/2019   Procedure: ESOPHAGEAL BANDING;  Surgeon: Saintclair Jasper, MD;  Location: [facility];  Service: Gastroenterology;;   ESOPHAGEAL BANDING N/A 08/21/2020   Procedure: ESOPHAGEAL BANDING;  Surgeon: Saintclair Jasper, MD;  Location: [facility];  Service: Gastroenterology;  Laterality: N/A;   ESOPHAGEAL  BANDING  10/21/2023   Procedure: ESOPHAGEAL BANDING;  Surgeon: Saintclair Jasper, MD;  Location: [facility];  Service: Gastroenterology;;   ESOPHAGOGASTRODUODENOSCOPY N/A 04/28/2018   Procedure: ESOPHAGOGASTRODUODENOSCOPY (EGD);  Surgeon: Saintclair Jasper, MD;  Location: [facility];  Service: Gastroenterology;  Laterality: N/A;   ESOPHAGOGASTRODUODENOSCOPY N/A 11/17/2018   Procedure: ESOPHAGOGASTRODUODENOSCOPY (EGD);  Surgeon: Saintclair Jasper, MD;  Location: [facility];  Service: Gastroenterology;  Laterality: N/A;   ESOPHAGOGASTRODUODENOSCOPY  N/A 08/13/2022   Procedure: ESOPHAGOGASTRODUODENOSCOPY (EGD);  Surgeon: Saintclair Jasper, MD;  Location: [facility];  Service: Gastroenterology;  Laterality: N/A;   ESOPHAGOGASTRODUODENOSCOPY (EGD) WITH PROPOFOL  N/A 04/11/2016   Procedure: ESOPHAGOGASTRODUODENOSCOPY (EGD) WITH PROPOFOL ;  Surgeon: Oliva Boots, MD;  Location: [facility];  Service: Endoscopy;  Laterality: N/A;   ESOPHAGOGASTRODUODENOSCOPY (EGD) WITH PROPOFOL  N/A 07/08/2016   Procedure: ESOPHAGOGASTRODUODENOSCOPY (EGD) WITH PROPOFOL ;  Surgeon: Lynwood Bohr, MD;  Location: [facility] ENDOSCOPY;  Service: Endoscopy;  Laterality: N/A;   ESOPHAGOGASTRODUODENOSCOPY (EGD) WITH PROPOFOL  N/A 03/03/2017   Procedure: ESOPHAGOGASTRODUODENOSCOPY (EGD) WITH PROPOFOL ;  Surgeon: Lynwood Bohr, MD;  Location: [facility] ENDOSCOPY;  Service: Endoscopy;  Laterality: N/A;   ESOPHAGOGASTRODUODENOSCOPY (EGD) WITH PROPOFOL  N/A 09/04/2017   Procedure: ESOPHAGOGASTRODUODENOSCOPY (EGD) WITH PROPOFOL ;  Surgeon: Saintclair Jasper, MD;  Location: [facility] ENDOSCOPY;  Service: Gastroenterology;  Laterality: N/A;   ESOPHAGOGASTRODUODENOSCOPY (EGD) WITH PROPOFOL  N/A 11/27/2017   Procedure: ESOPHAGOGASTRODUODENOSCOPY (EGD);  Surgeon: Saintclair Jasper, MD;  Location: [facility] ENDOSCOPY;  Service: Gastroenterology;  Laterality: N/A;   ESOPHAGOGASTRODUODENOSCOPY (EGD) WITH PROPOFOL  N/A 08/16/2019   Procedure: ESOPHAGOGASTRODUODENOSCOPY (EGD) WITH PROPOFOL ;  Surgeon: Saintclair Jasper, MD;  Location: [facility];  Service: Gastroenterology;  Laterality: N/A;   ESOPHAGOGASTRODUODENOSCOPY (EGD) WITH PROPOFOL  N/A 08/21/2020   Procedure: ESOPHAGOGASTRODUODENOSCOPY (EGD) WITH PROPOFOL ;  Surgeon: Saintclair Jasper, MD;  Location: [facility];  Service: Gastroenterology;  Laterality: N/A;   ESOPHAGOGASTRODUODENOSCOPY (EGD) WITH PROPOFOL  N/A 08/24/2021   Procedure: ESOPHAGOGASTRODUODENOSCOPY (EGD) WITH PROPOFOL ;  Surgeon: Saintclair Jasper, MD;  Location: [facility];  Service: Gastroenterology;  Laterality: N/A;    ESOPHAGOGASTRODUODENOSCOPY (EGD) WITH PROPOFOL  N/A 06/25/2023   Procedure: ESOPHAGOGASTRODUODENOSCOPY (EGD) WITH PROPOFOL ;  Surgeon: Boots Oliva, MD;  Location: [facility];  Service: Gastroenterology;  Laterality: N/A;  W. bandging   ESOPHAGOGASTRODUODENOSCOPY (EGD) WITH PROPOFOL  N/A 10/21/2023   Procedure: ESOPHAGOGASTRODUODENOSCOPY (EGD) WITH PROPOFOL ;  Surgeon: Saintclair Jasper, MD;  Location: [facility];  Service: Gastroenterology;  Laterality: N/A;  with banding and APC   GASTRIC VARICES BANDING N/A 08/24/2021   Procedure: GASTRIC VARICES BANDING;  Surgeon: Saintclair Jasper, MD;  Location: [facility];  Service: Gastroenterology;  Laterality: N/A;   GASTRIC VARICES BANDING N/A 08/13/2022   Procedure: GASTRIC VARICES BANDING;  Surgeon: Saintclair Jasper, MD;  Location: [facility];  Service: Gastroenterology;  Laterality: N/A;   HEMOSTASIS CLIP PLACEMENT  08/21/2020   Procedure: HEMOSTASIS CLIP PLACEMENT;  Surgeon: Saintclair Jasper, MD;  Location: [facility];  Service: Gastroenterology;;   HOT HEMOSTASIS N/A 10/21/2023   Procedure: HOT HEMOSTASIS (ARGON PLASMA COAGULATION/BICAP);  Surgeon: Saintclair Jasper, MD;  Location: [facility];  Service: Gastroenterology;  Laterality: N/A;   INTERCOSTAL NERVE BLOCK Left 07/01/2022   Procedure: INTERCOSTAL NERVE BLOCK;  Surgeon: Kerrin Elspeth BROCKS, MD;  Location: [facility] OR;  Service: Thoracic;  Laterality: Left;   IR GENERIC HISTORICAL  05/15/2016   IR RADIOLOGIST EVAL & MGMT 05/15/2016 Ami Bellman, DO [facility] INTERV RAD   IR GENERIC HISTORICAL  01/14/2017   IR US  GUIDE VASC ACCESS RIGHT 01/14/2017 Ozell Specking, MD [facility]   IR GENERIC HISTORICAL  01/14/2017   IR VENOGRAM HEPATIC W HEMODYNAMIC EVALUATION 01/14/2017  Ozell Specking, MD [facility]   IR RADIOLOGIST EVAL & MGMT  06/12/2017   IR RADIOLOGIST EVAL & MGMT  08/11/2018   IR RADIOLOGIST EVAL & MGMT  08/25/2019   IR RADIOLOGIST EVAL & MGMT  08/22/2020   IR RADIOLOGIST EVAL & MGMT  03/11/2022   MASTECTOMY  Right 06/02/2012   right breast with lymph node removal   NODE DISSECTION Left 07/01/2022   Procedure: NODE DISSECTION;  Surgeon: Kerrin Elspeth BROCKS, MD;  Location: [facility] OR;  Service: Thoracic;  Laterality: Left;   PORT-A-CATH REMOVAL     PORTACATH PLACEMENT  2011   left side   RADIOFREQUENCY ABLATION  06/25/2023   Procedure: RADIO FREQUENCY ABLATION;  Surgeon: Rosalie Kitchens, MD;  Location: [facility];  Service: Gastroenterology;;   RADIOLOGY WITH ANESTHESIA N/A 04/13/2016   Procedure: RADIOLOGY WITH ANESTHESIA;  Surgeon: Ami Bellman, DO;  Location: [facility] OR;  Service: Anesthesiology;  Laterality: N/A;   VIDEO BRONCHOSCOPY WITH ENDOBRONCHIAL NAVIGATION N/A 07/01/2022   Procedure: VIDEO BRONCHOSCOPY WITH ENDOBRONCHIAL NAVIGATION;  Surgeon: Kerrin Elspeth BROCKS, MD;  Location: [facility] OR;  Service: Thoracic;  Laterality: N/A;   XI ROBOTIC ASSISTED THORACOSCOPY- SEGMENTECTOMY Left 07/01/2022   Procedure: XI ROBOTIC ASSISTED THORACOSCOPY-LEFT LOWER LOBE BASILAR SEGMENTECTOMY;  Surgeon: Kerrin Elspeth BROCKS, MD;  Location: [facility] OR;  Service: Thoracic;  Laterality: Left;   Social History:  reports that she has never smoked. She has never used smokeless tobacco. She reports that she does not drink alcohol and does not use drugs.  No Known Allergies  Family History  Problem Relation Age of Onset   COPD Mother    AAA (abdominal aortic aneurysm) Mother    COPD Father    Cancer Neg Hx     Prior to Admission medications   Medication Sig Start Date End Date Taking? Authorizing Provider  denosumab  (PROLIA ) 60 MG/ML SOLN injection Inject 60 mg into the skin every 6 (six) months. Administer in upper arm, thigh, or abdomen    [provider]  ELIQUIS  5 MG TABS tablet TAKE 1 TABLET BY MOUTH TWICE A DAY 10/16/24   Iruku, Praveena, MD  ferrous sulfate  325 (65 FE) MG tablet Take 1 tablet (325 mg total) by mouth daily before breakfast. Patient not taking: Reported on 09/20/2024 03/06/24   Kenard Zachary PARAS, MD  fulvestrant  (FASLODEX ) 250 MG/5ML injection Inject 250-500 mg into the muscle See admin instructions. Loading dose of 500mg  on days 1,15, and 29. THEN 250mg  every 28 days thereafter    [provider]  furosemide  (LASIX ) 20 MG tablet Take 20 mg by mouth daily. 06/03/23   [provider]  Lactulose  20 GM/30ML SOLN Take 30 mLs (20 g total) by mouth 2 (two) times daily. 11/30/24   Iruku, Praveena, MD  Milk Thistle 1000 MG CAPS Take 1,000 mg by mouth daily. 11/14/21   [provider]  palbociclib  (IBRANCE ) 75 MG tablet Take 1 tablet (75 mg total) by mouth daily. Take for 21 days on, 7 days off, repeat every 28 days. 11/11/24   Iruku, Praveena, MD  pantoprazole  (PROTONIX ) 40 MG tablet Take 40 mg by mouth daily.    [provider]  propranolol  (INDERAL ) 20 MG tablet Take 1 tablet (20 mg total) by mouth 2 (two) times daily. 09/07/17   Hongalgi, Anand D, MD  spironolactone  (ALDACTONE ) 25 MG tablet Take 25 mg by mouth daily. Patient not taking: Reported on 09/20/2024 06/03/23   [provider]  venlafaxine  XR (EFFEXOR -XR) 150 MG 24 hr capsule  TAKE 1 CAPSULE BY MOUTH DAILY WITH BREAKFAST. 08/23/24   Loretha Ash, MD    Physical Exam: Vitals:   12/07/24 1315 12/07/24 1505  BP: 120/67 125/61  Pulse: 67 69  Resp: 16 16  Temp: 97.7 F (36.5 C) 97.7 F (36.5 C)  TempSrc: Oral   SpO2: (!) 82% 100%  O2 sats were 100% every other measure.  This sat is felt to be in error.  Physical Exam:  General: No acute distress, pale, frail,sallow HEENT: Normocephalic, atraumatic, PERRL Cardiovascular: Normal rate and rhythm. Distal pulses intact. Pulmonary: Normal pulmonary effort, normal breath sounds Gastrointestinal: Nondistended abdomen, full, non-tender, normoactive bowel sounds Musculoskeletal:Normal ROM, no lower ext edema Lymphadenopathy: No cervical LAD. Skin: Skin is warm and dry. Neuro: No focal deficits noted, AAOx3. PSYCH: Attentive and  cooperative  Data Reviewed:  Results for orders placed or performed in visit on 12/07/24 (from the past 24 hours)  Sample to Blood Bank     Status: None   Collection Time: 12/07/24 12:32 PM  Result Value Ref Range   Blood Bank Specimen SAMPLE AVAILABLE FOR TESTING    Sample Expiration      12/10/2024,2359 Performed at [facility], [address], [place], [place] [address]   CMP (Cancer Center only)     Status: Abnormal   Collection Time: 12/07/24 12:32 PM  Result Value Ref Range   Sodium 137 135 - 145 mmol/L   Potassium 3.9 3.5 - 5.1 mmol/L   Chloride 104 98 - 111 mmol/L   CO2 20 (L) 22 - 32 mmol/L   Glucose, Bld 109 (H) 70 - 99 mg/dL   BUN 14 8 - 23 mg/dL   Creatinine 8.94 (H) 9.55 - 1.00 mg/dL   Calcium 8.5 (L) 8.9 - 10.3 mg/dL   Total Protein 6.2 (L) 6.5 - 8.1 g/dL   Albumin  3.5 3.5 - 5.0 g/dL   AST 25 15 - 41 U/L   ALT 19 0 - 44 U/L   Alkaline Phosphatase 66 38 - 126 U/L   Total Bilirubin 0.7 0.0 - 1.2 mg/dL   GFR, Estimated 57 (L) >60 mL/min   Anion gap 13 5 - 15  CBC with Differential (Cancer Center Only)     Status: Abnormal   Collection Time: 12/07/24 12:36 PM  Result Value Ref Range   WBC Count 5.6 4.0 - 10.5 K/uL   RBC 1.61 (L) 3.87 - 5.11 MIL/uL   Hemoglobin 5.6 (LL) 12.0 - 15.0 g/dL   HCT 81.2 (L) 63.9 - 53.9 %   MCV 116.1 (H) 80.0 - 100.0 fL   MCH 34.8 (H) 26.0 - 34.0 pg   MCHC 29.9 (L) 30.0 - 36.0 g/dL   RDW 79.9 (H) 88.4 - 84.4 %   Platelet Count 278 150 - 400 K/uL   nRBC 1.8 (H) 0.0 - 0.2 %   Neutrophils Relative % 76 %   Neutro Abs 4.2 1.7 - 7.7 K/uL   Lymphocytes Relative 8 %   Lymphs Abs 0.4 (L) 0.7 - 4.0 K/uL   Monocytes Relative 10 %   Monocytes Absolute 0.5 0.1 - 1.0 K/uL   Eosinophils Relative 1 %   Eosinophils Absolute 0.1 0.0 - 0.5 K/uL   Basophils Relative 0 %   Basophils Absolute 0.0 0.0 - 0.1 K/uL   Immature Granulocytes 5 %   Abs Immature Granulocytes 0.27 (H) 0.00 - 0.07 K/uL   *Note: Due to a large number of  results and/or encounters for the requested time  period, some results have not been displayed. A complete set of results can be found in Results Review.     Assessment and Plan: Acute blood loss anemia - [facility] consulted - They recommended IV octreotide  and n.p.o. after midnight - Hold Eliquis  - 2 units of packed red blood cells have been ordered for now - Monitor hemoglobin  History of metastatic breast cancer currently on Ibrance  and Faslodex  Nonalcoholic cirrhosis with ascites -she had a paracentesis scheduled for today which will need to be rescheduled. History of portal vein thrombosis -  Eliquis  will be held for now. Constipation -she reports no improvement with lactulose .  Teach patient how to titrate dose up as needed once the GI bleeding issue is stable    Advance Care Planning:   Code Status: Prior  The patient names her husband as a surrogate decision maker and wants to be DNR.  She says monitor time absolutely no life support let her go.  Consults: [facility]  Family Communication: Husband at bedside  Severity of Illness: The appropriate patient status for this patient is INPATIENT. Inpatient status is judged to be reasonable and necessary in order to provide the required intensity of service to ensure the patient's safety. The patient's presenting symptoms, physical exam findings, and initial radiographic and laboratory data in the context of their chronic comorbidities is felt to place them at high risk for further clinical deterioration. Furthermore, it is not anticipated that the patient will be medically stable for discharge from the hospital within 2 midnights of admission.   * I certify that at the point of admission it is my clinical judgment that the patient will require inpatient hospital care spanning beyond 2 midnights from the point of admission due to high intensity of service, high risk for further deterioration and high frequency of surveillance  required.*  Author: ARTHEA CHILD, MD 12/07/2024 3:14 PM  For on call review [email or web address].

## 2024-12-07 NOTE — Telephone Encounter (Signed)
 Called pt's husband, Reyes per MD as pt is currently in IR for thoracentesis.   Per MD, pt should go to ED d/t hgb 5.6 to find out where she may be losing blood. Pt's husband is agreeable and he will take her to [facility] ED after thoracentesis.

## 2024-12-07 NOTE — ED Triage Notes (Signed)
 Pt arrives to to triage via wheelchair reporting abnormal labs. Pt was found to have a hemoglobin of 5.2 and hematocrit of 18.7. Pt has complaints of general weakness over the past few weeks. Denies blood in stool.

## 2024-12-08 ENCOUNTER — Inpatient Hospital Stay (HOSPITAL_COMMUNITY)

## 2024-12-08 ENCOUNTER — Other Ambulatory Visit: Payer: Self-pay

## 2024-12-08 DIAGNOSIS — D649 Anemia, unspecified: Secondary | ICD-10-CM

## 2024-12-08 LAB — CBC
HCT: 24.9 % — ABNORMAL LOW (ref 36.0–46.0)
HCT: 27.4 % — ABNORMAL LOW (ref 36.0–46.0)
Hemoglobin: 8 g/dL — ABNORMAL LOW (ref 12.0–15.0)
Hemoglobin: 8.9 g/dL — ABNORMAL LOW (ref 12.0–15.0)
MCH: 33.2 pg (ref 26.0–34.0)
MCH: 32.8 pg (ref 26.0–34.0)
MCHC: 32.1 g/dL (ref 30.0–36.0)
MCHC: 32.5 g/dL (ref 30.0–36.0)
MCV: 103.3 fL — ABNORMAL HIGH (ref 80.0–100.0)
MCV: 101.1 fL — ABNORMAL HIGH (ref 80.0–100.0)
Platelets: 148 K/uL — ABNORMAL LOW (ref 150–400)
Platelets: 150 K/uL (ref 150–400)
RBC: 2.41 MIL/uL — ABNORMAL LOW (ref 3.87–5.11)
RBC: 2.71 MIL/uL — ABNORMAL LOW (ref 3.87–5.11)
RDW: 21.1 % — ABNORMAL HIGH (ref 11.5–15.5)
RDW: 21.8 % — ABNORMAL HIGH (ref 11.5–15.5)
WBC: 2.7 K/uL — ABNORMAL LOW (ref 4.0–10.5)
WBC: 4.9 K/uL (ref 4.0–10.5)
nRBC: 1.5 % — ABNORMAL HIGH (ref 0.0–0.2)
nRBC: 1.6 % — ABNORMAL HIGH (ref 0.0–0.2)

## 2024-12-08 LAB — BASIC METABOLIC PANEL WITH GFR
Anion gap: 12 (ref 5–15)
BUN: 12 mg/dL (ref 8–23)
CO2: 20 mmol/L — ABNORMAL LOW (ref 22–32)
Calcium: 7.9 mg/dL — ABNORMAL LOW (ref 8.9–10.3)
Chloride: 105 mmol/L (ref 98–111)
Creatinine, Ser: 1.03 mg/dL — ABNORMAL HIGH (ref 0.44–1.00)
GFR, Estimated: 59 mL/min — ABNORMAL LOW (ref >60)
Glucose, Bld: 114 mg/dL — ABNORMAL HIGH (ref 70–99)
Potassium: 3.7 mmol/L (ref 3.5–5.1)
Sodium: 137 mmol/L (ref 135–145)

## 2024-12-08 MED ORDER — SODIUM CHLORIDE 0.9 % IV SOLN
2.0000 g | INTRAVENOUS | Status: DC
  Administered 2024-12-08 – 2024-12-10 (×3): 2 g via INTRAVENOUS
  Filled 2024-12-08 (×3): qty 20

## 2024-12-08 MED ORDER — PANTOPRAZOLE SODIUM 40 MG IV SOLR
40.0000 mg | Freq: Two times a day (BID) | INTRAVENOUS | Status: DC
  Administered 2024-12-08 – 2024-12-10 (×5): 40 mg via INTRAVENOUS
  Filled 2024-12-08 (×5): qty 10

## 2024-12-08 MED ORDER — FERROUS SULFATE 325 (65 FE) MG PO TABS
325.0000 mg | ORAL_TABLET | Freq: Every day | ORAL | Status: DC
  Administered 2024-12-09 – 2024-12-11 (×3): 325 mg via ORAL
  Filled 2024-12-08 (×3): qty 1

## 2024-12-08 MED ORDER — FUROSEMIDE 40 MG PO TABS
40.0000 mg | ORAL_TABLET | Freq: Every day | ORAL | Status: DC
  Administered 2024-12-08: 40 mg via ORAL
  Filled 2024-12-08: qty 1

## 2024-12-08 MED ORDER — ACETAMINOPHEN 500 MG PO TABS: 500.0000 mg | ORAL_TABLET | Freq: Three times a day (TID) | ORAL | Status: DC | PRN

## 2024-12-08 MED ORDER — TRAMADOL HCL 50 MG PO TABS
50.0000 mg | ORAL_TABLET | Freq: Two times a day (BID) | ORAL | Status: DC | PRN
  Administered 2024-12-08 – 2024-12-10 (×3): 50 mg via ORAL
  Filled 2024-12-08 (×3): qty 1

## 2024-12-08 MED ORDER — SPIRONOLACTONE 100 MG PO TABS
100.0000 mg | ORAL_TABLET | Freq: Every day | ORAL | Status: DC
  Administered 2024-12-08: 100 mg via ORAL
  Filled 2024-12-08: qty 1

## 2024-12-08 MED ORDER — LACTULOSE 10 GM/15ML PO SOLN
20.0000 g | Freq: Every day | ORAL | Status: DC
  Administered 2024-12-08 – 2024-12-11 (×4): 20 g via ORAL
  Filled 2024-12-08 (×4): qty 30

## 2024-12-08 MED ORDER — LIDOCAINE-EPINEPHRINE (PF) 2 %-1:200000 IJ SOLN
INTRAMUSCULAR | Status: AC
  Filled 2024-12-08: qty 20

## 2024-12-08 NOTE — H&P (View-Only) (Signed)
 Referring Provider: [facility] Primary Care Physician:  [facility] [place] Primary Gastroenterologist: [facility]  Reason for Consultation: Anemia  HPI: Nicole [name] is a 68 y.o. female with past medical history of metastatic breast cancer, history of nonalcoholic cirrhosis complicated by portal hypertension and esophageal varices requiring band ligation in the past, history of portal vein thrombosis on Eliquis , history of ascites requiring paracentesis history of recurrent anemia requiring iron  infusion and blood transfusion presented to the hospital for abnormal labs concerning for anemia with hemoglobin of 5.9.  Patient denies seeing any blood in the stool or black stool.  Last EGD was in October 2024 and a band ligation was performed.  Multiple EGDs in the last few years.  CT abdomen pelvis with IV contrast in November 2025 showed cirrhosis with portal hypertension and moderate volume ascites.  Patient has received blood transfusion.  Hemoglobin 8 this morning.  INR trending up to 2.6.   Past Medical History:  Diagnosis Date   Anemia    low iron    Arthritis    fingers   Blood transfusion 2012   Breast CA (HCC)    breast ca dx 06/2010, stage 4, right , Lung Metasis.Surgery, Chemo, Radiation   Breast cancer (HCC)    Clot    jugular  ?2015   Depression    Esophageal varices (HCC)    Headache    Mirgraine- rare since menopause   Heart murmur    mild, no cardiologist, from birth   Hematemesis 08/2017   History of kidney stones    no pain- shows in CT- small 1 mm bilaterally- kidney   Hypertension    IBD (inflammatory bowel disease)    resolved   Idiopathic cirrhosis (HCC)    pt denies   Peripheral vascular disease few yrs ago   clot in right jugular   Portal hypertension (HCC)    Radiation 07/21/12-09/07/12   5940 cGy   Restless legs     Past Surgical History:  Procedure Laterality Date   BIOPSY  08/21/2020   Procedure: BIOPSY;  Surgeon: Saintclair Jasper, MD;   Location: [facility];  Service: Gastroenterology;;   COLONOSCOPY  2016   DILATION AND CURETTAGE OF UTERUS  2004?   ESOPHAGEAL BANDING  09/04/2017   Procedure: ESOPHAGEAL BANDING;  Surgeon: Saintclair Jasper, MD;  Location: [facility] ENDOSCOPY;  Service: Gastroenterology;;   ESOPHAGEAL BANDING  11/27/2017   Procedure: ESOPHAGEAL BANDING;  Surgeon: Saintclair Jasper, MD;  Location: [facility] ENDOSCOPY;  Service: Gastroenterology;;   ESOPHAGEAL BANDING N/A 04/28/2018   Procedure: ESOPHAGEAL BANDING;  Surgeon: Saintclair Jasper, MD;  Location: [facility];  Service: Gastroenterology;  Laterality: N/A;   ESOPHAGEAL BANDING  11/17/2018   Procedure: ESOPHAGEAL BANDING;  Surgeon: Saintclair Jasper, MD;  Location: [facility];  Service: Gastroenterology;;   ESOPHAGEAL BANDING  08/16/2019   Procedure: ESOPHAGEAL BANDING;  Surgeon: Saintclair Jasper, MD;  Location: [facility];  Service: Gastroenterology;;   ESOPHAGEAL BANDING N/A 08/21/2020   Procedure: ESOPHAGEAL BANDING;  Surgeon: Saintclair Jasper, MD;  Location: [facility];  Service: Gastroenterology;  Laterality: N/A;   ESOPHAGEAL BANDING  10/21/2023   Procedure: ESOPHAGEAL BANDING;  Surgeon: Saintclair Jasper, MD;  Location: [facility];  Service: Gastroenterology;;   ESOPHAGOGASTRODUODENOSCOPY N/A 04/28/2018   Procedure: ESOPHAGOGASTRODUODENOSCOPY (EGD);  Surgeon: Saintclair Jasper, MD;  Location: [facility];  Service: Gastroenterology;  Laterality: N/A;   ESOPHAGOGASTRODUODENOSCOPY N/A 11/17/2018   Procedure: ESOPHAGOGASTRODUODENOSCOPY (EGD);  Surgeon: Saintclair Jasper, MD;  Location: [facility];  Service: Gastroenterology;  Laterality: N/A;  ESOPHAGOGASTRODUODENOSCOPY N/A 08/13/2022   Procedure: ESOPHAGOGASTRODUODENOSCOPY (EGD);  Surgeon: Saintclair Jasper, MD;  Location: [facility];  Service: Gastroenterology;  Laterality: N/A;   ESOPHAGOGASTRODUODENOSCOPY (EGD) WITH PROPOFOL  N/A 04/11/2016   Procedure: ESOPHAGOGASTRODUODENOSCOPY (EGD) WITH PROPOFOL ;  Surgeon: Oliva Boots, MD;  Location: [facility]  ENDOSCOPY;  Service: Endoscopy;  Laterality: N/A;   ESOPHAGOGASTRODUODENOSCOPY (EGD) WITH PROPOFOL  N/A 07/08/2016   Procedure: ESOPHAGOGASTRODUODENOSCOPY (EGD) WITH PROPOFOL ;  Surgeon: Lynwood Bohr, MD;  Location: [facility]OSCOPY;  Service: Endoscopy;  Laterality: N/A;   ESOPHAGOGASTRODUODENOSCOPY (EGD) WITH PROPOFOL  N/A 03/03/2017   Procedure: ESOPHAGOGASTRODUODENOSCOPY (EGD) WITH PROPOFOL ;  Surgeon: Lynwood Bohr, MD;  Location: [facility] ENDOSCOPY;  Service: Endoscopy;  Laterality: N/A;   ESOPHAGOGASTRODUODENOSCOPY (EGD) WITH PROPOFOL  N/A 09/04/2017   Procedure: ESOPHAGOGASTRODUODENOSCOPY (EGD) WITH PROPOFOL ;  Surgeon: Saintclair Jasper, MD;  Location: [facility] ENDOSCOPY;  Service: Gastroenterology;  Laterality: N/A;   ESOPHAGOGASTRODUODENOSCOPY (EGD) WITH PROPOFOL  N/A 11/27/2017   Procedure: ESOPHAGOGASTRODUODENOSCOPY (EGD);  Surgeon: Saintclair Jasper, MD;  Location: [facility] ENDOSCOPY;  Service: Gastroenterology;  Laterality: N/A;   ESOPHAGOGASTRODUODENOSCOPY (EGD) WITH PROPOFOL  N/A 08/16/2019   Procedure: ESOPHAGOGASTRODUODENOSCOPY (EGD) WITH PROPOFOL ;  Surgeon: Saintclair Jasper, MD;  Location: [facility];  Service: Gastroenterology;  Laterality: N/A;   ESOPHAGOGASTRODUODENOSCOPY (EGD) WITH PROPOFOL  N/A 08/21/2020   Procedure: ESOPHAGOGASTRODUODENOSCOPY (EGD) WITH PROPOFOL ;  Surgeon: Saintclair Jasper, MD;  Location: [facility];  Service: Gastroenterology;  Laterality: N/A;   ESOPHAGOGASTRODUODENOSCOPY (EGD) WITH PROPOFOL  N/A 08/24/2021   Procedure: ESOPHAGOGASTRODUODENOSCOPY (EGD) WITH PROPOFOL ;  Surgeon: Saintclair Jasper, MD;  Location: [facility];  Service: Gastroenterology;  Laterality: N/A;   ESOPHAGOGASTRODUODENOSCOPY (EGD) WITH PROPOFOL  N/A 06/25/2023   Procedure: ESOPHAGOGASTRODUODENOSCOPY (EGD) WITH PROPOFOL ;  Surgeon: Boots Oliva, MD;  Location: [facility];  Service: Gastroenterology;  Laterality: N/A;  W. bandging   ESOPHAGOGASTRODUODENOSCOPY (EGD) WITH PROPOFOL  N/A 10/21/2023   Procedure: ESOPHAGOGASTRODUODENOSCOPY  (EGD) WITH PROPOFOL ;  Surgeon: Saintclair Jasper, MD;  Location: [facility];  Service: Gastroenterology;  Laterality: N/A;  with banding and APC   GASTRIC VARICES BANDING N/A 08/24/2021   Procedure: GASTRIC VARICES BANDING;  Surgeon: Saintclair Jasper, MD;  Location: [facility];  Service: Gastroenterology;  Laterality: N/A;   GASTRIC VARICES BANDING N/A 08/13/2022   Procedure: GASTRIC VARICES BANDING;  Surgeon: Saintclair Jasper, MD;  Location: [facility];  Service: Gastroenterology;  Laterality: N/A;   HEMOSTASIS CLIP PLACEMENT  08/21/2020   Procedure: HEMOSTASIS CLIP PLACEMENT;  Surgeon: Saintclair Jasper, MD;  Location: [facility];  Service: Gastroenterology;;   HOT HEMOSTASIS N/A 10/21/2023   Procedure: HOT HEMOSTASIS (ARGON PLASMA COAGULATION/BICAP);  Surgeon: Saintclair Jasper, MD;  Location: [facility];  Service: Gastroenterology;  Laterality: N/A;   INTERCOSTAL NERVE BLOCK Left 07/01/2022   Procedure: INTERCOSTAL NERVE BLOCK;  Surgeon: Kerrin Elspeth BROCKS, MD;  Location: [facility] OR;  Service: Thoracic;  Laterality: Left;   IR GENERIC HISTORICAL  05/15/2016   IR RADIOLOGIST EVAL & MGMT 05/15/2016 Ami Bellman, DO [facility] INTERV RAD   IR GENERIC HISTORICAL  01/14/2017   IR US  GUIDE VASC ACCESS RIGHT 01/14/2017 Ozell Specking, MD [facility]   IR GENERIC HISTORICAL  01/14/2017   IR VENOGRAM HEPATIC W HEMODYNAMIC EVALUATION 01/14/2017 Ozell Specking, MD [facility]   IR RADIOLOGIST EVAL & MGMT  06/12/2017   IR RADIOLOGIST EVAL & MGMT  08/11/2018   IR RADIOLOGIST EVAL & MGMT  08/25/2019   IR RADIOLOGIST EVAL & MGMT  08/22/2020   IR RADIOLOGIST EVAL & MGMT  03/11/2022   MASTECTOMY Right 06/02/2012   right breast with lymph node removal   NODE DISSECTION Left 07/01/2022   Procedure:  NODE DISSECTION;  Surgeon: Kerrin Elspeth BROCKS, MD;  [facility] OR;  Service: Thoracic;  Laterality: Left;   PORT-A-CATH REMOVAL     PORTACATH PLACEMENT  2011   left side   RADIOFREQUENCY ABLATION  06/25/2023   Procedure: RADIO  FREQUENCY ABLATION;  Surgeon: Rosalie Kitchens, MD;  Location: [facility];  Service: Gastroenterology;;   RADIOLOGY WITH ANESTHESIA N/A 04/13/2016   Procedure: RADIOLOGY WITH ANESTHESIA;  Surgeon: Ami Bellman, DO;  Location: [facility] OR;  Service: Anesthesiology;  Laterality: N/A;   VIDEO BRONCHOSCOPY WITH ENDOBRONCHIAL NAVIGATION N/A 07/01/2022   Procedure: VIDEO BRONCHOSCOPY WITH ENDOBRONCHIAL NAVIGATION;  Surgeon: Kerrin Elspeth BROCKS, MD;  Location: [facility] OR;  Service: Thoracic;  Laterality: N/A;   XI ROBOTIC ASSISTED THORACOSCOPY- SEGMENTECTOMY Left 07/01/2022   Procedure: XI ROBOTIC ASSISTED THORACOSCOPY-LEFT LOWER LOBE BASILAR SEGMENTECTOMY;  Surgeon: Kerrin Elspeth BROCKS, MD;  Location: [facility] OR;  Service: Thoracic;  Laterality: Left;    Prior to Admission medications   Medication Sig Start Date End Date Taking? Authorizing Provider  cyanocobalamin  (VITAMIN B12) 1000 MCG tablet Take 1,000 mcg by mouth daily after breakfast.   Yes [provider]  ELIQUIS  5 MG TABS tablet TAKE 1 TABLET BY MOUTH TWICE A DAY Patient taking differently: Take 5 mg by mouth in the morning and at bedtime. 10/16/24  Yes Iruku, Praveena, MD  ferrous sulfate  325 (65 FE) MG tablet Take 1 tablet (325 mg total) by mouth daily before breakfast. Patient taking differently: Take 325 mg by mouth daily after breakfast. 03/06/24  Yes Shalhoub, Zachary PARAS, MD  fulvestrant  (FASLODEX ) 250 MG/5ML injection Inject 500 mg into the muscle every 28 (twenty-eight) days.   Yes [provider]  furosemide  (LASIX ) 40 MG tablet Take 40 mg by mouth in the morning.   Yes [provider]  Milk Thistle 1000 MG CAPS Take 1,000 mg by mouth daily. 11/14/21  Yes [provider]  pantoprazole  (PROTONIX ) 40 MG tablet Take 40 mg by mouth daily before breakfast.   Yes [provider]  propranolol  (INDERAL ) 20 MG tablet Take 1 tablet (20 mg total) by mouth 2 (two) times daily. Patient taking differently: Take 20 mg by  mouth See admin instructions. Take 20 mg by mouth at 11 AM and 11 PM 09/07/17  Yes Hongalgi, Anand D, MD  spironolactone  (ALDACTONE ) 100 MG tablet Take 100 mg by mouth in the morning.   Yes [provider]  venlafaxine  XR (EFFEXOR -XR) 150 MG 24 hr capsule TAKE 1 CAPSULE BY MOUTH DAILY WITH BREAKFAST. 08/23/24  Yes Iruku, Praveena, MD  denosumab  (PROLIA ) 60 MG/ML SOLN injection Inject 60 mg into the skin every 6 (six) months. Administer in upper arm, thigh, or abdomen    [provider]  Lactulose  20 GM/30ML SOLN Take 30 mLs (20 g total) by mouth 2 (two) times daily. Patient not taking: Reported on 12/07/2024 11/30/24   Iruku, Praveena, MD  palbociclib  (IBRANCE ) 75 MG tablet Take 1 tablet (75 mg total) by mouth daily. Take for 21 days on, 7 days off, repeat every 28 days. Patient not taking: Reported on 12/07/2024 11/11/24   Iruku, Praveena, MD    Scheduled Meds:  sodium chloride    Intravenous Once   sodium chloride  flush  3 mL Intravenous Q12H   venlafaxine  XR  150 mg Oral Q breakfast   Continuous Infusions:  octreotide  (SANDOSTATIN ) 500 mcg in sodium chloride  0.9 % 250 mL (2 mcg/mL) infusion 50 mcg/hr (12/07/24 1704)   PRN Meds:.ondansetron  **OR** ondansetron  (ZOFRAN ) IV  Allergies  as of 12/07/2024   (No Known Allergies)    Family History  Problem Relation Age of Onset   COPD Mother    AAA (abdominal aortic aneurysm) Mother    COPD Father    Cancer Neg Hx     Social History   Socioeconomic History   Marital status: Married    Spouse name: Not on file   Number of children: Not on file   Years of education: Not on file   Highest education level: Not on file  Occupational History   Not on file  Tobacco Use   Smoking status: Never   Smokeless tobacco: Never  Vaping Use   Vaping status: Never Used  Substance and Sexual Activity   Alcohol use: No   Drug use: No   Sexual activity: Yes  Other Topics Concern   Not on file  Social History Narrative   Not on  file   Social Drivers of Health   Financial Resource Strain: Not on file  Food Insecurity: No Food Insecurity (12/07/2024)   Hunger Vital Sign    Worried About Running Out of Food in the Last Year: Never true    Ran Out of Food in the Last Year: Never true  Transportation Needs: No Transportation Needs (12/07/2024)   PRAPARE - Administrator, Civil Service (Medical): No    Lack of Transportation (Non-Medical): No  Physical Activity: Not on file  Stress: Not on file  Social Connections: Socially Isolated (12/07/2024)   Social Connection and Isolation Panel    Frequency of Communication with Friends and Family: Twice a week    Frequency of Social Gatherings with Friends and Family: Never    Attends Religious Services: Never    Database Administrator or Organizations: No    Attends Banker Meetings: Never    Marital Status: Married  Catering Manager Violence: Not At Risk (12/07/2024)   Humiliation, Afraid, Rape, and Kick questionnaire    Fear of Current or Ex-Partner: No    Emotionally Abused: No    Physically Abused: No    Sexually Abused: No    Review of Systems: All negative except as stated above in HPI.  Physical Exam: Vital signs: Vitals:   12/08/24 0318 12/08/24 0611  BP: (!) 108/58 117/70  Pulse: 78 75  Resp: 18 18  Temp: 97.8 F (36.6 C) 98.1 F (36.7 C)  SpO2: 97% 99%   Last BM Date : 12/06/24 General:   Alert,  Well-developed, well-nourished, pleasant and cooperative in NAD Normocephalic, atraumatic, extraocular movement intact Lungs: No visible respiratory distress Heart:  Regular rate and rhythm; no murmurs, clicks, rubs,  or gallops. Abdomen: Abdomen is mildly distended, bowel sound present, nontender, no peritoneal signs Mood and affect normal Alert and oriented x 3 Rectal:  Deferred  GI:  Lab Results: Recent Labs    12/07/24 1236 12/08/24 0713  WBC 5.6 2.7*  HGB 5.6* 8.0*  HCT 18.7* 24.9*  PLT 278 148*   BMET Recent  Labs    12/07/24 1232 12/08/24 0713  NA 137 137  K 3.9 3.7  CL 104 105  CO2 20* 20*  GLUCOSE 109* 114*  BUN 14 12  CREATININE 1.05* 1.03*  CALCIUM 8.5* 7.9*   LFT Recent Labs    12/07/24 1232  PROT 6.2*  ALBUMIN  3.5  AST 25  ALT 19  ALKPHOS 66  BILITOT 0.7   PT/INR Recent Labs    12/07/24 1614  LABPROT 29.2*  INR 2.6*     Studies/Results: No results found.  Impression/Plan: -Symptomatic anemia with hemoglobin of 5.6 on admission.  History of decompensated cirrhosis complicated by esophageal varices requiring band ligation in the past.  No overt bleeding at this time. -Acute on chronic anemia - History of portal vein thrombosis.  Was on Eliquis . - History of metastatic breast cancer  Recommendations ------------------------ - Continue octreotide  and Protonix  for now.  Add Rocephin  for concern for GI bleeding patient with cirrhosis. - Continue to hold Eliquis  for now - Tentative plan for EGD tomorrow - Patient was scheduled for paracentesis yesterday which she missed because of her admission.  Will get it done while she is in the hospital.  Maximum 6 L.  No need for fluid analysis. - Okay to have full liquid diet today.  Keep n.p.o. past midnight.  Risks (bleeding, infection, bowel perforation that could require surgery, sedation-related changes in cardiopulmonary systems), benefits (identification and possible treatment of source of symptoms, exclusion of certain causes of symptoms), and alternatives (watchful waiting, radiographic imaging studies, empiric medical treatment)  were explained to patient/family in detail and patient wishes to proceed.    LOS: 1 day   Layla Lah  MD, FACP 12/08/2024, 8:24 AM  Contact #  [phone]

## 2024-12-08 NOTE — Consult Note (Signed)
 Referring Provider: [facility] Primary Care Physician:  [facility] [place] Primary Gastroenterologist: [facility]  Reason for Consultation: Anemia  HPI: Nicole [name] is a 68 y.o. female with past medical history of metastatic breast cancer, history of nonalcoholic cirrhosis complicated by portal hypertension and esophageal varices requiring band ligation in the past, history of portal vein thrombosis on Eliquis , history of ascites requiring paracentesis history of recurrent anemia requiring iron  infusion and blood transfusion presented to the hospital for abnormal labs concerning for anemia with hemoglobin of 5.9.  Patient denies seeing any blood in the stool or black stool.  Last EGD was in October 2024 and a band ligation was performed.  Multiple EGDs in the last few years.  CT abdomen pelvis with IV contrast in November 2025 showed cirrhosis with portal hypertension and moderate volume ascites.  Patient has received blood transfusion.  Hemoglobin 8 this morning.  INR trending up to 2.6.   Past Medical History:  Diagnosis Date   Anemia    low iron    Arthritis    fingers   Blood transfusion 2012   Breast CA (HCC)    breast ca dx 06/2010, stage 4, right , Lung Metasis.Surgery, Chemo, Radiation   Breast cancer (HCC)    Clot    jugular  ?2015   Depression    Esophageal varices (HCC)    Headache    Mirgraine- rare since menopause   Heart murmur    mild, no cardiologist, from birth   Hematemesis 08/2017   History of kidney stones    no pain- shows in CT- small 1 mm bilaterally- kidney   Hypertension    IBD (inflammatory bowel disease)    resolved   Idiopathic cirrhosis (HCC)    pt denies   Peripheral vascular disease few yrs ago   clot in right jugular   Portal hypertension (HCC)    Radiation 07/21/12-09/07/12   5940 cGy   Restless legs     Past Surgical History:  Procedure Laterality Date   BIOPSY  08/21/2020   Procedure: BIOPSY;  Surgeon: Saintclair Jasper, MD;   Location: [facility];  Service: Gastroenterology;;   COLONOSCOPY  2016   DILATION AND CURETTAGE OF UTERUS  2004?   ESOPHAGEAL BANDING  09/04/2017   Procedure: ESOPHAGEAL BANDING;  Surgeon: Saintclair Jasper, MD;  Location: [facility] ENDOSCOPY;  Service: Gastroenterology;;   ESOPHAGEAL BANDING  11/27/2017   Procedure: ESOPHAGEAL BANDING;  Surgeon: Saintclair Jasper, MD;  Location: [facility] ENDOSCOPY;  Service: Gastroenterology;;   ESOPHAGEAL BANDING N/A 04/28/2018   Procedure: ESOPHAGEAL BANDING;  Surgeon: Saintclair Jasper, MD;  Location: [facility];  Service: Gastroenterology;  Laterality: N/A;   ESOPHAGEAL BANDING  11/17/2018   Procedure: ESOPHAGEAL BANDING;  Surgeon: Saintclair Jasper, MD;  Location: [facility];  Service: Gastroenterology;;   ESOPHAGEAL BANDING  08/16/2019   Procedure: ESOPHAGEAL BANDING;  Surgeon: Saintclair Jasper, MD;  Location: [facility];  Service: Gastroenterology;;   ESOPHAGEAL BANDING N/A 08/21/2020   Procedure: ESOPHAGEAL BANDING;  Surgeon: Saintclair Jasper, MD;  Location: [facility];  Service: Gastroenterology;  Laterality: N/A;   ESOPHAGEAL BANDING  10/21/2023   Procedure: ESOPHAGEAL BANDING;  Surgeon: Saintclair Jasper, MD;  Location: [facility];  Service: Gastroenterology;;   ESOPHAGOGASTRODUODENOSCOPY N/A 04/28/2018   Procedure: ESOPHAGOGASTRODUODENOSCOPY (EGD);  Surgeon: Saintclair Jasper, MD;  Location: [facility];  Service: Gastroenterology;  Laterality: N/A;   ESOPHAGOGASTRODUODENOSCOPY N/A 11/17/2018   Procedure: ESOPHAGOGASTRODUODENOSCOPY (EGD);  Surgeon: Saintclair Jasper, MD;  Location: [facility];  Service: Gastroenterology;  Laterality: N/A;  ESOPHAGOGASTRODUODENOSCOPY N/A 08/13/2022   Procedure: ESOPHAGOGASTRODUODENOSCOPY (EGD);  Surgeon: Saintclair Jasper, MD;  Location: [facility];  Service: Gastroenterology;  Laterality: N/A;   ESOPHAGOGASTRODUODENOSCOPY (EGD) WITH PROPOFOL  N/A 04/11/2016   Procedure: ESOPHAGOGASTRODUODENOSCOPY (EGD) WITH PROPOFOL ;  Surgeon: Oliva Boots, MD;  Location: [facility]  ENDOSCOPY;  Service: Endoscopy;  Laterality: N/A;   ESOPHAGOGASTRODUODENOSCOPY (EGD) WITH PROPOFOL  N/A 07/08/2016   Procedure: ESOPHAGOGASTRODUODENOSCOPY (EGD) WITH PROPOFOL ;  Surgeon: Lynwood Bohr, MD;  Location: [facility]OSCOPY;  Service: Endoscopy;  Laterality: N/A;   ESOPHAGOGASTRODUODENOSCOPY (EGD) WITH PROPOFOL  N/A 03/03/2017   Procedure: ESOPHAGOGASTRODUODENOSCOPY (EGD) WITH PROPOFOL ;  Surgeon: Lynwood Bohr, MD;  Location: [facility] ENDOSCOPY;  Service: Endoscopy;  Laterality: N/A;   ESOPHAGOGASTRODUODENOSCOPY (EGD) WITH PROPOFOL  N/A 09/04/2017   Procedure: ESOPHAGOGASTRODUODENOSCOPY (EGD) WITH PROPOFOL ;  Surgeon: Saintclair Jasper, MD;  Location: [facility] ENDOSCOPY;  Service: Gastroenterology;  Laterality: N/A;   ESOPHAGOGASTRODUODENOSCOPY (EGD) WITH PROPOFOL  N/A 11/27/2017   Procedure: ESOPHAGOGASTRODUODENOSCOPY (EGD);  Surgeon: Saintclair Jasper, MD;  Location: [facility] ENDOSCOPY;  Service: Gastroenterology;  Laterality: N/A;   ESOPHAGOGASTRODUODENOSCOPY (EGD) WITH PROPOFOL  N/A 08/16/2019   Procedure: ESOPHAGOGASTRODUODENOSCOPY (EGD) WITH PROPOFOL ;  Surgeon: Saintclair Jasper, MD;  Location: [facility];  Service: Gastroenterology;  Laterality: N/A;   ESOPHAGOGASTRODUODENOSCOPY (EGD) WITH PROPOFOL  N/A 08/21/2020   Procedure: ESOPHAGOGASTRODUODENOSCOPY (EGD) WITH PROPOFOL ;  Surgeon: Saintclair Jasper, MD;  Location: [facility];  Service: Gastroenterology;  Laterality: N/A;   ESOPHAGOGASTRODUODENOSCOPY (EGD) WITH PROPOFOL  N/A 08/24/2021   Procedure: ESOPHAGOGASTRODUODENOSCOPY (EGD) WITH PROPOFOL ;  Surgeon: Saintclair Jasper, MD;  Location: [facility];  Service: Gastroenterology;  Laterality: N/A;   ESOPHAGOGASTRODUODENOSCOPY (EGD) WITH PROPOFOL  N/A 06/25/2023   Procedure: ESOPHAGOGASTRODUODENOSCOPY (EGD) WITH PROPOFOL ;  Surgeon: Boots Oliva, MD;  Location: [facility];  Service: Gastroenterology;  Laterality: N/A;  W. bandging   ESOPHAGOGASTRODUODENOSCOPY (EGD) WITH PROPOFOL  N/A 10/21/2023   Procedure: ESOPHAGOGASTRODUODENOSCOPY  (EGD) WITH PROPOFOL ;  Surgeon: Saintclair Jasper, MD;  Location: [facility];  Service: Gastroenterology;  Laterality: N/A;  with banding and APC   GASTRIC VARICES BANDING N/A 08/24/2021   Procedure: GASTRIC VARICES BANDING;  Surgeon: Saintclair Jasper, MD;  Location: [facility];  Service: Gastroenterology;  Laterality: N/A;   GASTRIC VARICES BANDING N/A 08/13/2022   Procedure: GASTRIC VARICES BANDING;  Surgeon: Saintclair Jasper, MD;  Location: [facility];  Service: Gastroenterology;  Laterality: N/A;   HEMOSTASIS CLIP PLACEMENT  08/21/2020   Procedure: HEMOSTASIS CLIP PLACEMENT;  Surgeon: Saintclair Jasper, MD;  Location: [facility];  Service: Gastroenterology;;   HOT HEMOSTASIS N/A 10/21/2023   Procedure: HOT HEMOSTASIS (ARGON PLASMA COAGULATION/BICAP);  Surgeon: Saintclair Jasper, MD;  Location: [facility];  Service: Gastroenterology;  Laterality: N/A;   INTERCOSTAL NERVE BLOCK Left 07/01/2022   Procedure: INTERCOSTAL NERVE BLOCK;  Surgeon: Kerrin Elspeth BROCKS, MD;  Location: [facility] OR;  Service: Thoracic;  Laterality: Left;   IR GENERIC HISTORICAL  05/15/2016   IR RADIOLOGIST EVAL & MGMT 05/15/2016 Ami Bellman, DO [facility] INTERV RAD   IR GENERIC HISTORICAL  01/14/2017   IR US  GUIDE VASC ACCESS RIGHT 01/14/2017 Ozell Specking, MD [facility]   IR GENERIC HISTORICAL  01/14/2017   IR VENOGRAM HEPATIC W HEMODYNAMIC EVALUATION 01/14/2017 Ozell Specking, MD [facility]   IR RADIOLOGIST EVAL & MGMT  06/12/2017   IR RADIOLOGIST EVAL & MGMT  08/11/2018   IR RADIOLOGIST EVAL & MGMT  08/25/2019   IR RADIOLOGIST EVAL & MGMT  08/22/2020   IR RADIOLOGIST EVAL & MGMT  03/11/2022   MASTECTOMY Right 06/02/2012   right breast with lymph node removal   NODE DISSECTION Left 07/01/2022   Procedure:  NODE DISSECTION;  Surgeon: Kerrin Elspeth BROCKS, MD;  Location[facility] OR;  Service: Thoracic;  Laterality: Left;   PORT-A-CATH REMOVAL     PORTACATH PLACEMENT  2011   left side   RADIOFREQUENCY ABLATION  06/25/2023   Procedure: RADIO  FREQUENCY ABLATION;  Surgeon: Rosalie Kitchens, MD;  Location: [facility];  Service: Gastroenterology;;   RADIOLOGY WITH ANESTHESIA N/A 04/13/2016   Procedure: RADIOLOGY WITH ANESTHESIA;  Surgeon: Ami Bellman, DO;  Location: [facility] OR;  Service: Anesthesiology;  Laterality: N/A;   VIDEO BRONCHOSCOPY WITH ENDOBRONCHIAL NAVIGATION N/A 07/01/2022   Procedure: VIDEO BRONCHOSCOPY WITH ENDOBRONCHIAL NAVIGATION;  Surgeon: Kerrin Elspeth BROCKS, MD;  Location: [facility] OR;  Service: Thoracic;  Laterality: N/A;   XI ROBOTIC ASSISTED THORACOSCOPY- SEGMENTECTOMY Left 07/01/2022   Procedure: XI ROBOTIC ASSISTED THORACOSCOPY-LEFT LOWER LOBE BASILAR SEGMENTECTOMY;  Surgeon: Kerrin Elspeth BROCKS, MD;  Location: [facility] OR;  Service: Thoracic;  Laterality: Left;    Prior to Admission medications   Medication Sig Start Date End Date Taking? Authorizing Provider  cyanocobalamin  (VITAMIN B12) 1000 MCG tablet Take 1,000 mcg by mouth daily after breakfast.   Yes [provider]  ELIQUIS  5 MG TABS tablet TAKE 1 TABLET BY MOUTH TWICE A DAY Patient taking differently: Take 5 mg by mouth in the morning and at bedtime. 10/16/24  Yes Iruku, Praveena, MD  ferrous sulfate  325 (65 FE) MG tablet Take 1 tablet (325 mg total) by mouth daily before breakfast. Patient taking differently: Take 325 mg by mouth daily after breakfast. 03/06/24  Yes Shalhoub, Zachary PARAS, MD  fulvestrant  (FASLODEX ) 250 MG/5ML injection Inject 500 mg into the muscle every 28 (twenty-eight) days.   Yes [provider]  furosemide  (LASIX ) 40 MG tablet Take 40 mg by mouth in the morning.   Yes [provider]  Milk Thistle 1000 MG CAPS Take 1,000 mg by mouth daily. 11/14/21  Yes [provider]  pantoprazole  (PROTONIX ) 40 MG tablet Take 40 mg by mouth daily before breakfast.   Yes [provider]  propranolol  (INDERAL ) 20 MG tablet Take 1 tablet (20 mg total) by mouth 2 (two) times daily. Patient taking differently: Take 20 mg by  mouth See admin instructions. Take 20 mg by mouth at 11 AM and 11 PM 09/07/17  Yes Hongalgi, Anand D, MD  spironolactone  (ALDACTONE ) 100 MG tablet Take 100 mg by mouth in the morning.   Yes [provider]  venlafaxine  XR (EFFEXOR -XR) 150 MG 24 hr capsule TAKE 1 CAPSULE BY MOUTH DAILY WITH BREAKFAST. 08/23/24  Yes Iruku, Praveena, MD  denosumab  (PROLIA ) 60 MG/ML SOLN injection Inject 60 mg into the skin every 6 (six) months. Administer in upper arm, thigh, or abdomen    [provider]  Lactulose  20 GM/30ML SOLN Take 30 mLs (20 g total) by mouth 2 (two) times daily. Patient not taking: Reported on 12/07/2024 11/30/24   Iruku, Praveena, MD  palbociclib  (IBRANCE ) 75 MG tablet Take 1 tablet (75 mg total) by mouth daily. Take for 21 days on, 7 days off, repeat every 28 days. Patient not taking: Reported on 12/07/2024 11/11/24   Iruku, Praveena, MD    Scheduled Meds:  sodium chloride    Intravenous Once   sodium chloride  flush  3 mL Intravenous Q12H   venlafaxine  XR  150 mg Oral Q breakfast   Continuous Infusions:  octreotide  (SANDOSTATIN ) 500 mcg in sodium chloride  0.9 % 250 mL (2 mcg/mL) infusion 50 mcg/hr (12/07/24 1704)   PRN Meds:.ondansetron  **OR** ondansetron  (ZOFRAN ) IV  Allergies  as of 12/07/2024   (No Known Allergies)    Family History  Problem Relation Age of Onset   COPD Mother    AAA (abdominal aortic aneurysm) Mother    COPD Father    Cancer Neg Hx     Social History   Socioeconomic History   Marital status: Married    Spouse name: Not on file   Number of children: Not on file   Years of education: Not on file   Highest education level: Not on file  Occupational History   Not on file  Tobacco Use   Smoking status: Never   Smokeless tobacco: Never  Vaping Use   Vaping status: Never Used  Substance and Sexual Activity   Alcohol use: No   Drug use: No   Sexual activity: Yes  Other Topics Concern   Not on file  Social History Narrative   Not on  file   Social Drivers of Health   Financial Resource Strain: Not on file  Food Insecurity: No Food Insecurity (12/07/2024)   Hunger Vital Sign    Worried About Running Out of Food in the Last Year: Never true    Ran Out of Food in the Last Year: Never true  Transportation Needs: No Transportation Needs (12/07/2024)   PRAPARE - Administrator, Civil Service (Medical): No    Lack of Transportation (Non-Medical): No  Physical Activity: Not on file  Stress: Not on file  Social Connections: Socially Isolated (12/07/2024)   Social Connection and Isolation Panel    Frequency of Communication with Friends and Family: Twice a week    Frequency of Social Gatherings with Friends and Family: Never    Attends Religious Services: Never    Database Administrator or Organizations: No    Attends Banker Meetings: Never    Marital Status: Married  Catering Manager Violence: Not At Risk (12/07/2024)   Humiliation, Afraid, Rape, and Kick questionnaire    Fear of Current or Ex-Partner: No    Emotionally Abused: No    Physically Abused: No    Sexually Abused: No    Review of Systems: All negative except as stated above in HPI.  Physical Exam: Vital signs: Vitals:   12/08/24 0318 12/08/24 0611  BP: (!) 108/58 117/70  Pulse: 78 75  Resp: 18 18  Temp: 97.8 F (36.6 C) 98.1 F (36.7 C)  SpO2: 97% 99%   Last BM Date : 12/06/24 General:   Alert,  Well-developed, well-nourished, pleasant and cooperative in NAD Normocephalic, atraumatic, extraocular movement intact Lungs: No visible respiratory distress Heart:  Regular rate and rhythm; no murmurs, clicks, rubs,  or gallops. Abdomen: Abdomen is mildly distended, bowel sound present, nontender, no peritoneal signs Mood and affect normal Alert and oriented x 3 Rectal:  Deferred  GI:  Lab Results: Recent Labs    12/07/24 1236 12/08/24 0713  WBC 5.6 2.7*  HGB 5.6* 8.0*  HCT 18.7* 24.9*  PLT 278 148*   BMET Recent  Labs    12/07/24 1232 12/08/24 0713  NA 137 137  K 3.9 3.7  CL 104 105  CO2 20* 20*  GLUCOSE 109* 114*  BUN 14 12  CREATININE 1.05* 1.03*  CALCIUM 8.5* 7.9*   LFT Recent Labs    12/07/24 1232  PROT 6.2*  ALBUMIN  3.5  AST 25  ALT 19  ALKPHOS 66  BILITOT 0.7   PT/INR Recent Labs    12/07/24 1614  LABPROT 29.2*  INR 2.6*     Studies/Results: No results found.  Impression/Plan: -Symptomatic anemia with hemoglobin of 5.6 on admission.  History of decompensated cirrhosis complicated by esophageal varices requiring band ligation in the past.  No overt bleeding at this time. -Acute on chronic anemia - History of portal vein thrombosis.  Was on Eliquis . - History of metastatic breast cancer  Recommendations ------------------------ - Continue octreotide  and Protonix  for now.  Add Rocephin  for concern for GI bleeding patient with cirrhosis. - Continue to hold Eliquis  for now - Tentative plan for EGD tomorrow - Patient was scheduled for paracentesis yesterday which she missed because of her admission.  Will get it done while she is in the hospital.  Maximum 6 L.  No need for fluid analysis. - Okay to have full liquid diet today.  Keep n.p.o. past midnight.  Risks (bleeding, infection, bowel perforation that could require surgery, sedation-related changes in cardiopulmonary systems), benefits (identification and possible treatment of source of symptoms, exclusion of certain causes of symptoms), and alternatives (watchful waiting, radiographic imaging studies, empiric medical treatment)  were explained to patient/family in detail and patient wishes to proceed.    LOS: 1 day   Layla Lah  MD, FACP 12/08/2024, 8:24 AM  Contact #  [phone]

## 2024-12-08 NOTE — ED Provider Notes (Signed)
 [facility] Provider Note   CSN: [id number] Arrival date & time: 12/07/24  1310     Patient presents with: Abnormal Lab   Nicole [name] is a 68 y.o. female.   HPI     68 y.o. 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  She has ascites and periodic paracenteses.  She also intermittently requires iron  infusions and blood transfusions.  The patient has been feeling more weak and short of breath than usual and the family called and requested blood work.  She was actually at the hospital for a scheduled paracentesis when her husband got the call to go to the ED because her blood work revealed hemoglobin of 5.9.  The patient has not seen any blood in her stool or urine and has not vomited any blood or had any nosebleeds.  Prior to Admission medications  Medication Sig Start Date End Date Taking? Authorizing Provider  cyanocobalamin  (VITAMIN B12) 1000 MCG tablet Take 1,000 mcg by mouth daily after breakfast.   Yes [provider]  ferrous sulfate  325 (65 FE) MG tablet Take 1 tablet (325 mg total) by mouth daily before breakfast. Patient taking differently: Take 325 mg by mouth daily after breakfast. 03/06/24  Yes Shalhoub, Zachary PARAS, MD  fulvestrant  (FASLODEX ) 250 MG/5ML injection Inject 500 mg into the muscle every 28 (twenty-eight) days.   Yes [provider]  furosemide  (LASIX ) 40 MG tablet Take 40 mg by mouth in the morning.   Yes [provider]  Milk Thistle 1000 MG CAPS Take 1,000 mg by mouth daily. 11/14/21  Yes [provider]  spironolactone  (ALDACTONE ) 100 MG tablet Take 100 mg by mouth in the morning.   Yes [provider]  venlafaxine  XR (EFFEXOR -XR) 150 MG 24 hr capsule TAKE 1 CAPSULE BY MOUTH DAILY WITH BREAKFAST. 08/23/24  Yes Iruku, Praveena, MD  apixaban   (ELIQUIS ) 2.5 MG TABS tablet Take 1 tablet (2.5 mg total) by mouth 2 (two) times daily. 12/14/24   Iruku, Praveena, MD  carvedilol  (COREG ) 3.125 MG tablet Take 1 tablet (3.125 mg total) by mouth 2 (two) times daily with a meal. 12/11/24   Tobie Yetta HERO, MD  denosumab  (PROLIA ) 60 MG/ML SOLN injection Inject 60 mg into the skin every 6 (six) months. Administer in upper arm, thigh, or abdomen    [provider]  Lactulose  20 GM/30ML SOLN Take 30 mLs (20 g total) by mouth 2 (two) times daily. Patient not taking: Reported on 12/07/2024 11/30/24   Iruku, Praveena, MD  palbociclib  (IBRANCE ) 75 MG tablet Take 1 tablet (75 mg total) by mouth daily. Take for 21 days on, 7 days off, repeat every 28 days. Patient not taking: Reported on 12/07/2024 11/11/24   Iruku, Praveena, MD  pantoprazole  (PROTONIX ) 40 MG tablet Take 1 tablet (40 mg total) by mouth 2 (two) times daily before a meal. 12/11/24   Tobie Yetta HERO, MD    Allergies: Patient has no known allergies.    Review of Systems  All other systems reviewed and are negative.   Updated Vital Signs BP (!) 143/71   Pulse 75   Temp 98 F (36.7 C) (Oral)   Resp 14   Ht 5' 4 (1.626 m)   Wt 58.4 kg   SpO2 98%   BMI 22.10 kg/m   Physical Exam Vitals and nursing note reviewed.  Constitutional:      Appearance: She is well-developed.  HENT:     Head: Atraumatic.  Eyes:     General: No scleral icterus.    Extraocular Movements: Extraocular movements intact.     Pupils: Pupils are equal, round, and reactive to light.  Cardiovascular:     Rate and Rhythm: Normal rate.  Pulmonary:     Effort: Pulmonary effort is normal.  Abdominal:     Tenderness: There is no abdominal tenderness.  Musculoskeletal:     Cervical back: Normal range of motion and neck supple.  Skin:    General: Skin is warm and dry.     Coloration: Skin is pale.  Neurological:     Mental Status: She is alert and oriented to person, place, and time.     (all labs  ordered are listed, but only abnormal results are displayed) Labs Reviewed  PROTIME-INR - Abnormal; Notable for the following components:      Result Value   Prothrombin Time 29.2 (*)    INR 2.6 (*)    All other components within normal limits  APTT - Abnormal; Notable for the following components:   aPTT 49 (*)    All other components within normal limits  VITAMIN B12 - Abnormal; Notable for the following components:   Vitamin B-12 3,931 (*)    All other components within normal limits  IRON  AND TIBC - Abnormal; Notable for the following components:   Saturation Ratios 8 (*)    All other components within normal limits  RETICULOCYTES - Abnormal; Notable for the following components:   Retic Ct Pct 9.6 (*)    RBC. 1.55 (*)    Immature Retic Fract 41.6 (*)    All other components within normal limits  BASIC METABOLIC PANEL WITH GFR - Abnormal; Notable for the following components:   CO2 20 (*)    Glucose, Bld 114 (*)    Creatinine, Ser 1.03 (*)    Calcium 7.9 (*)    GFR, Estimated 59 (*)    All other components within normal limits  CBC - Abnormal; Notable for the following components:   WBC 2.7 (*)    RBC 2.41 (*)    Hemoglobin 8.0 (*)    HCT 24.9 (*)    MCV 103.3 (*)    RDW 21.1 (*)    Platelets 148 (*)    nRBC 1.5 (*)    All other components within normal limits  CBC - Abnormal; Notable for the following components:   RBC 2.71 (*)    Hemoglobin 8.9 (*)    HCT 27.4 (*)    MCV 101.1 (*)    RDW 21.8 (*)    nRBC 1.6 (*)    All other components within normal limits  BASIC METABOLIC PANEL WITH GFR - Abnormal; Notable for the following components:   CO2 21 (*)    Glucose, Bld 115 (*)    Creatinine, Ser 1.22 (*)    Calcium 7.5 (*)    GFR, Estimated 48 (*)    All other components within normal limits  CBC - Abnormal; Notable for the following components:   WBC 3.4 (*)    RBC 2.65 (*)    Hemoglobin 8.6 (*)    HCT 27.6 (*)    MCV 104.2 (*)    RDW 21.4 (*)     Platelets 138 (*)    nRBC 0.9 (*)    All other components within normal limits  CBC - Abnormal; Notable  for the following components:   WBC 3.4 (*)    RBC 2.31 (*)    Hemoglobin 7.6 (*)    HCT 24.0 (*)    MCV 103.9 (*)    RDW 21.2 (*)    Platelets 121 (*)    nRBC 0.6 (*)    All other components within normal limits  BASIC METABOLIC PANEL WITH GFR - Abnormal; Notable for the following components:   CO2 19 (*)    Glucose, Bld 108 (*)    Calcium 7.3 (*)    All other components within normal limits  CBC - Abnormal; Notable for the following components:   WBC 3.3 (*)    RBC 2.37 (*)    Hemoglobin 7.6 (*)    HCT 24.8 (*)    MCV 104.6 (*)    RDW 21.0 (*)    Platelets 136 (*)    nRBC 0.6 (*)    All other components within normal limits  CBC - Abnormal; Notable for the following components:   WBC 2.9 (*)    RBC 2.28 (*)    Hemoglobin 7.4 (*)    HCT 24.0 (*)    MCV 105.3 (*)    RDW 21.2 (*)    Platelets 123 (*)    nRBC 0.7 (*)    All other components within normal limits  CBC - Abnormal; Notable for the following components:   RBC 2.90 (*)    Hemoglobin 9.4 (*)    HCT 31.5 (*)    MCV 108.6 (*)    MCHC 29.8 (*)    RDW 21.2 (*)    nRBC 0.5 (*)    All other components within normal limits  BASIC METABOLIC PANEL WITH GFR - Abnormal; Notable for the following components:   Potassium 3.3 (*)    CO2 21 (*)    Calcium 6.9 (*)    All other components within normal limits  CBC - Abnormal; Notable for the following components:   WBC 3.0 (*)    RBC 3.01 (*)    Hemoglobin 9.8 (*)    HCT 30.6 (*)    MCV 101.7 (*)    RDW 20.2 (*)    Platelets 128 (*)    All other components within normal limits  FOLATE  FERRITIN  MAGNESIUM   MAGNESIUM   MAGNESIUM   TYPE AND SCREEN  PREPARE RBC (CROSSMATCH)  PREPARE RBC (CROSSMATCH)  BLOOD TRANSFUSION REPORT - SCANNED   Narrative:    Ordered by an unspecified provider.    EKG: None  Radiology: No results found.    .Critical  Care  Performed by: Charlyn Sora, MD Authorized by: Charlyn Sora, MD   Critical care provider statement:    Critical care time (minutes):  41   Critical care was necessary to treat or prevent imminent or life-threatening deterioration of the following conditions:  Circulatory failure   Critical care was time spent personally by me on the following activities:  Development of treatment plan with patient or surrogate, discussions with consultants, evaluation of patient's response to treatment, examination of patient, ordering and review of laboratory studies, ordering and review of radiographic studies, ordering and performing treatments and interventions, pulse oximetry, re-evaluation of patient's condition, review of old charts and obtaining history from patient or surrogate    Medications Ordered in the ED  octreotide  (SANDOSTATIN ) 2 mcg/mL load via infusion 50 mcg (50 mcg Intravenous Bolus from Bag 12/07/24 1723)    And  octreotide  (SANDOSTATIN ) 500 mcg in sodium chloride  0.9 % 250  mL (2 mcg/mL) infusion (0 mcg/hr Intravenous Stopped 12/11/24 0842)  0.9 %  sodium chloride  infusion (Manually program via Guardrails IV Fluids) ( Intravenous New Bag/Given 12/10/24 1759)  pantoprazole  (PROTONIX ) injection 40 mg (40 mg Intravenous Given 12/07/24 1627)  traMADol  (ULTRAM ) tablet 50 mg (50 mg Oral Given 12/07/24 1658)  Glycerin  (Adult) 2 g suppository 1 suppository (1 suppository Rectal Given 12/07/24 2031)  albumin  human 25 % solution 25 g (25 g Intravenous New Bag/Given 12/09/24 1448)  0.9 %  sodium chloride  infusion (Manually program via Guardrails IV Fluids) ( Intravenous Other (enter comment in med admin window) 12/10/24 2000)  potassium chloride  SA (KLOR-CON  M) CR tablet 40 mEq (40 mEq Oral Given 12/11/24 1238)                                    Medical Decision Making Amount and/or Complexity of Data Reviewed Labs: ordered.  Risk Prescription drug management. Decision regarding  hospitalization.   68 y.o. 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  She has ascites and periodic paracenteses. She comes to the emergency room with chief complaint of low hemoglobin.  Patient admits to having increasing weakness over the last several days.  She also has shortness of breath with exertion.  She was seeing her oncology team, that noted that patient's hemoglobin had dropped to less than 6.  Patient has required previously transfusion, there was completed as an outpatient by the oncology service.  It was thought that perhaps her anemia was that of chronic disease.  Patient denies any melena, hematemesis.  She and the husband indicate that patient has not had any significant issues with the varices since they were treated several months ago  I have reviewed patient's records including oncology note, GI note.  I have reviewed patient's labs.  Workup in the emergency room confirms severe anemia I ordered INR, which is slightly elevated as well.  We consulted GI, they recommend starting octreotide  and admitting patient for further workup.  Patient and family aware of the plan. Oncology team also notified about patient being in the emergency room.  Final diagnoses:  Symptomatic anemia    ED Discharge Orders          Ordered    carvedilol  (COREG ) 3.125 MG tablet  2 times daily with meals        12/11/24 1208    pantoprazole  (PROTONIX ) 40 MG tablet  2 times daily before meals        12/11/24 1208    Increase activity slowly        12/11/24 1208               Charlyn Sora, MD 12/14/24 1536

## 2024-12-08 NOTE — Procedures (Signed)
 PROCEDURE SUMMARY:  Successful ultrasound guided paracentesis from the right lower quadrant.  Yielded  of 2 liters of straw colore fluid.  No immediate complications.  The patient tolerated the procedure well.    EBL < 2 mL  If the patient eventually requires >/=2 paracenteses in a 30 day period, screening evaluation by the [facility] will be assessed.

## 2024-12-08 NOTE — Plan of Care (Signed)

## 2024-12-08 NOTE — Plan of Care (Signed)

## 2024-12-08 NOTE — Progress Notes (Signed)
 [facility] Progress Note Patient: Nicole [name] FMW:[id number] DOB: 06/15/1956  DOA: 12/07/2024 DOS: the patient was seen and examined on 12/08/2024  Brief Hospital Course: PMH of cirrhosis, HTN, varices, breast cancer presented to the hospital with complaints of abnormal lab. Presented with weakness and shortness of breath to PCPs office.  Went for the blood work.  Hemoglobin was found to be 5.9.  Recommended for ED admission. GI consulted. Plan for EGD and paracentesis in the hospital.  Assessment and Plan: Symptomatic anemia Presents with anemia. Hemoglobin 5.9. Transfused 2 PRBC. H&H relatively stable for now. GI consulted. Continue PPI, octreotide . Initiated on ceftriaxone  for SBP prophylaxis. Transfuse for hemoglobin less than 7. EGD tomorrow. Scheduled for IV iron  outpatient. Eliquis  on hold.   Idiopathic cirrhosis (HCC) Resume home regimen of spironolactone  and furosemide  Resume lactulose .  Chronic anticoagulation Portal venous thrombosis. Resume Eliquis , monitor for any evidence of bleeding  Ascites. Patient will be needing paracentesis.  Subjective: No nausea no vomiting no fever no chills.  Feeling better.  Physical Exam: Clear to auscultation. S1-S2 present Abdomen distended.  Diffusely tender. No edema.  Data Reviewed: I have Reviewed nursing notes, Vitals, and Lab results. Since last encounter, pertinent lab results CBC and BMP   . I have ordered test including CBC and BMP  . I have discussed pt's care plan and test results with GI  .   Disposition: Status is: Inpatient Remains inpatient appropriate because: Monitor for improvement in H&H  SCDs Start: 12/07/24 1552  Family Communication: Husband at bedside Level of care: Telemetry   Vitals:   12/08/24 1130 12/08/24 1141 12/08/24 1156 12/08/24 1318  BP: (!) 128/58 (!) 118/58 (!) 116/56 122/66  Pulse:    80  Resp:    20  Temp:    97.9 F (36.6 C)  TempSrc:    Oral  SpO2:    100%   Weight:      Height:         Author: Yetta Blanch, MD 12/08/2024 5:38 PM  Please look on [email or web address] to find out who is on call.

## 2024-12-09 ENCOUNTER — Encounter (HOSPITAL_COMMUNITY): Payer: Self-pay | Admitting: Internal Medicine

## 2024-12-09 ENCOUNTER — Encounter (HOSPITAL_COMMUNITY): Admission: EM | Disposition: A | Payer: Self-pay | Source: Home / Self Care | Attending: Internal Medicine

## 2024-12-09 ENCOUNTER — Inpatient Hospital Stay (HOSPITAL_COMMUNITY): Admitting: Anesthesiology

## 2024-12-09 HISTORY — PX: ESOPHAGOGASTRODUODENOSCOPY: SHX5428

## 2024-12-09 LAB — CBC
HCT: 27.6 % — ABNORMAL LOW (ref 36.0–46.0)
HCT: 24 % — ABNORMAL LOW (ref 36.0–46.0)
HCT: 24 % — ABNORMAL LOW (ref 36.0–46.0)
Hemoglobin: 8.6 g/dL — ABNORMAL LOW (ref 12.0–15.0)
Hemoglobin: 7.6 g/dL — ABNORMAL LOW (ref 12.0–15.0)
Hemoglobin: 7.4 g/dL — ABNORMAL LOW (ref 12.0–15.0)
MCH: 32.5 pg (ref 26.0–34.0)
MCH: 32.9 pg (ref 26.0–34.0)
MCH: 32.5 pg (ref 26.0–34.0)
MCHC: 31.2 g/dL (ref 30.0–36.0)
MCHC: 31.7 g/dL (ref 30.0–36.0)
MCHC: 30.8 g/dL (ref 30.0–36.0)
MCV: 104.2 fL — ABNORMAL HIGH (ref 80.0–100.0)
MCV: 103.9 fL — ABNORMAL HIGH (ref 80.0–100.0)
MCV: 105.3 fL — ABNORMAL HIGH (ref 80.0–100.0)
Platelets: 138 K/uL — ABNORMAL LOW (ref 150–400)
Platelets: 121 K/uL — ABNORMAL LOW (ref 150–400)
Platelets: 123 K/uL — ABNORMAL LOW (ref 150–400)
RBC: 2.65 MIL/uL — ABNORMAL LOW (ref 3.87–5.11)
RBC: 2.31 MIL/uL — ABNORMAL LOW (ref 3.87–5.11)
RBC: 2.28 MIL/uL — ABNORMAL LOW (ref 3.87–5.11)
RDW: 21.4 % — ABNORMAL HIGH (ref 11.5–15.5)
RDW: 21.2 % — ABNORMAL HIGH (ref 11.5–15.5)
RDW: 21.2 % — ABNORMAL HIGH (ref 11.5–15.5)
WBC: 3.4 K/uL — ABNORMAL LOW (ref 4.0–10.5)
WBC: 3.4 K/uL — ABNORMAL LOW (ref 4.0–10.5)
WBC: 2.9 K/uL — ABNORMAL LOW (ref 4.0–10.5)
nRBC: 0.9 % — ABNORMAL HIGH (ref 0.0–0.2)
nRBC: 0.6 % — ABNORMAL HIGH (ref 0.0–0.2)
nRBC: 0.7 % — ABNORMAL HIGH (ref 0.0–0.2)

## 2024-12-09 LAB — BASIC METABOLIC PANEL WITH GFR
Anion gap: 11 (ref 5–15)
BUN: 11 mg/dL (ref 8–23)
CO2: 21 mmol/L — ABNORMAL LOW (ref 22–32)
Calcium: 7.5 mg/dL — ABNORMAL LOW (ref 8.9–10.3)
Chloride: 105 mmol/L (ref 98–111)
Creatinine, Ser: 1.22 mg/dL — ABNORMAL HIGH (ref 0.44–1.00)
GFR, Estimated: 48 mL/min — ABNORMAL LOW (ref >60)
Glucose, Bld: 115 mg/dL — ABNORMAL HIGH (ref 70–99)
Potassium: 3.5 mmol/L (ref 3.5–5.1)
Sodium: 137 mmol/L (ref 135–145)

## 2024-12-09 LAB — MAGNESIUM: Magnesium: 2.1 mg/dL (ref 1.7–2.4)

## 2024-12-09 SURGERY — EGD (ESOPHAGOGASTRODUODENOSCOPY)
Anesthesia: Monitor Anesthesia Care

## 2024-12-09 MED ORDER — PROPOFOL 10 MG/ML IV BOLUS
INTRAVENOUS | Status: AC
  Filled 2024-12-09: qty 20

## 2024-12-09 MED ORDER — OXYCODONE HCL 5 MG PO TABS: 5.0000 mg | ORAL_TABLET | Freq: Once | ORAL | Status: DC | PRN

## 2024-12-09 MED ORDER — PROPOFOL 500 MG/50ML IV EMUL
INTRAVENOUS | Status: AC
  Filled 2024-12-09: qty 200

## 2024-12-09 MED ORDER — PROPOFOL 500 MG/50ML IV EMUL
INTRAVENOUS | Status: DC | PRN
  Administered 2024-12-09: 130 ug/kg/min via INTRAVENOUS

## 2024-12-09 MED ORDER — OXYCODONE HCL 5 MG/5ML PO SOLN: 5.0000 mg | Freq: Once | ORAL | Status: DC | PRN

## 2024-12-09 MED ORDER — FUROSEMIDE 40 MG PO TABS
40.0000 mg | ORAL_TABLET | Freq: Every day | ORAL | Status: DC
  Administered 2024-12-10 – 2024-12-11 (×2): 40 mg via ORAL
  Filled 2024-12-09 (×3): qty 1

## 2024-12-09 MED ORDER — SODIUM CHLORIDE 0.9 % IV SOLN
INTRAVENOUS | Status: DC
  Administered 2024-12-09: 500 mL via INTRAVENOUS

## 2024-12-09 MED ORDER — ONDANSETRON HCL 4 MG/2ML IJ SOLN: 4.0000 mg | Freq: Once | INTRAMUSCULAR | Status: DC | PRN

## 2024-12-09 MED ORDER — PROPOFOL 10 MG/ML IV BOLUS
INTRAVENOUS | Status: DC | PRN
  Administered 2024-12-09: 20 mg via INTRAVENOUS
  Administered 2024-12-09: 30 mg via INTRAVENOUS

## 2024-12-09 MED ORDER — LIDOCAINE 2% (20 MG/ML) 5 ML SYRINGE
INTRAMUSCULAR | Status: DC | PRN
  Administered 2024-12-09: 100 mg via INTRAVENOUS

## 2024-12-09 MED ORDER — FENTANYL CITRATE (PF) 100 MCG/2ML IJ SOLN: 25.0000 ug | INTRAMUSCULAR | Status: DC | PRN

## 2024-12-09 MED ORDER — MEPERIDINE HCL 25 MG/ML IJ SOLN: 6.2500 mg | INTRAMUSCULAR | Status: DC | PRN

## 2024-12-09 MED ORDER — ALBUMIN HUMAN 25 % IV SOLN
25.0000 g | Freq: Once | INTRAVENOUS | Status: AC
  Administered 2024-12-09: 25 g via INTRAVENOUS
  Filled 2024-12-09: qty 100

## 2024-12-09 MED ADMIN — Carvedilol Tab 3.125 MG: 3.125 mg | ORAL | NDC 72888003401

## 2024-12-09 MED FILL — Carvedilol Tab 3.125 MG: 3.1250 mg | ORAL | Qty: 1 | Status: AC

## 2024-12-09 MED FILL — Spironolactone Tab 100 MG: 100.0000 mg | ORAL | Qty: 1 | Status: AC

## 2024-12-09 NOTE — Anesthesia Postprocedure Evaluation (Signed)
 Anesthesia Post Note  Patient: Nicole [name]  Procedure(s) Performed: EGD (ESOPHAGOGASTRODUODENOSCOPY)     Patient location during evaluation: PACU Anesthesia Type: MAC Level of consciousness: awake and alert Pain management: pain level controlled Vital Signs Assessment: post-procedure vital signs reviewed and stable Respiratory status: spontaneous breathing, nonlabored ventilation, respiratory function stable and patient connected to nasal cannula oxygen Cardiovascular status: stable and blood pressure returned to baseline Postop Assessment: no apparent nausea or vomiting Anesthetic complications: no   No notable events documented.  Last Vitals:  Vitals:   12/09/24 1300 12/09/24 1321  BP: 137/73 139/61  Pulse: 77 74  Resp: 18 15  Temp:  36.6 C  SpO2: 97% 100%    Last Pain:  Vitals:   12/09/24 1321  TempSrc: Oral  PainSc:                  Hazen Brumett

## 2024-12-09 NOTE — Transfer of Care (Signed)
 Immediate Anesthesia Transfer of Care Note  Patient: Nicole [name]  Procedure(s) Performed: EGD (ESOPHAGOGASTRODUODENOSCOPY)  Patient Location: PACU  Anesthesia Type:MAC  Level of Consciousness: sedated  Airway & Oxygen Therapy: Patient Spontanous Breathing and Patient connected to face mask oxygen  Post-op Assessment: Report given to RN and Post -op Vital signs reviewed and stable  Post vital signs: Reviewed and stable  Last Vitals:  Vitals Value Taken Time  BP    Temp    Pulse    Resp    SpO2      Last Pain:  Vitals:   12/09/24 1126  TempSrc: Temporal  PainSc: 0-No pain         Complications: No notable events documented.

## 2024-12-09 NOTE — Op Note (Signed)
 [facility] Patient Name: Nicole [name] Procedure Date: 12/09/2024 MRN: [id number] Attending MD: Layla Lah , MD, [id number] Date of Birth: 1956/08/22 CSN: [id number] Age: 68 Admit Type: Inpatient Procedure:                Upper GI endoscopy Indications:              Iron  deficiency anemia Providers:                Layla Lah, MD, Almarie Masters, RN, Janie                            [place], [place] CRNA Referring MD:              Medicines:                Sedation Administered by an Anesthesia Professional Complications:            No immediate complications. Estimated Blood Loss:     Estimated blood loss was minimal. Procedure:                Pre-Anesthesia Assessment:                           - Prior to the procedure, a History and Physical                            was performed, and patient medications and                            allergies were reviewed. The patient's tolerance of                            previous anesthesia was also reviewed. The risks                            and benefits of the procedure and the sedation                            options and risks were discussed with the patient.                            All questions were answered, and informed consent                            was obtained. Prior Anticoagulants: The patient has                            taken Eliquis  (apixaban ), last dose was 2 days                            prior to procedure. ASA Grade Assessment: III - A                            patient with severe systemic disease. After  reviewing the risks and benefits, the patient was                            deemed in satisfactory condition to undergo the                            procedure.                           After obtaining informed consent, the endoscope was                            passed under direct vision. Throughout the                             procedure, the patient's blood pressure, pulse, and                            oxygen saturations were monitored continuously. The                            [id number]-[id number] ([id number]) Olympus endoscope was introduced                            through the mouth, and advanced to the second part                            of duodenum. The upper GI endoscopy was                            accomplished without difficulty. The patient                            tolerated the procedure well. Scope In: Scope Out: Findings:      Two columns of grade I, small (< 5 mm) varices with no bleeding and no       stigmata of recent bleeding were found in the lower third of the       esophagus. Stigmata of prior treatment were evident.      A widely patent Schatzki ring was found at the gastroesophageal junction.      A small hiatal hernia was present.      A large amount of food (residue) was found in the gastric body.      Few non-bleeding superficial gastric ulcers with no stigmata of bleeding       were found in the prepyloric region of the stomach. The largest lesion       was 4 mm in largest dimension.      The duodenal bulb, first portion of the duodenum and second portion of       the duodenum were normal. Impression:               - Grade I and small (< 5 mm) esophageal varices                            with no bleeding and no stigmata  of recent bleeding.                           - Widely patent Schatzki ring.                           - Small hiatal hernia.                           - A large amount of food (residue) in the stomach.                           - Non-bleeding gastric ulcers with no stigmata of                            bleeding.                           - Normal duodenal bulb, first portion of the                            duodenum and second portion of the duodenum.                           - No specimens collected. Moderate Sedation:      Moderate (conscious) sedation was  personally administered by an       anesthesia professional. The following parameters were monitored: oxygen       saturation, heart rate, blood pressure, and response to care. Recommendation:           - Return patient to hospital ward for ongoing care.                           - Soft diet.                           - Continue present medications. Procedure Code(s):        --- Professional ---                           [phone], Esophagogastroduodenoscopy, flexible,                            transoral; diagnostic, including collection of                            specimen(s) by brushing or washing, when performed                            (separate procedure) Diagnosis Code(s):        --- Professional ---                           I85.00, Esophageal varices without bleeding                           K22.2, Esophageal obstruction  K44.9, Diaphragmatic hernia without obstruction or                            gangrene                           K25.9, Gastric ulcer, unspecified as acute or                            chronic, without hemorrhage or perforation                           D50.9, Iron  deficiency anemia, unspecified CPT copyright 2022 American Medical Association. All rights reserved. The codes documented in this report are preliminary and upon coder review may  be revised to meet current compliance requirements. Layla Lah, MD Layla Lah, MD 12/09/2024 12:43:22 PM Number of Addenda: 0

## 2024-12-09 NOTE — Interval H&P Note (Signed)
 History and Physical Interval Note:  12/09/2024 11:21 AM  Nicole [name]  has presented today for surgery, with the diagnosis of Anemia, Cirrhosis.  The various methods of treatment have been discussed with the patient and family. After consideration of risks, benefits and other options for treatment, the patient has consented to  Procedures: EGD (ESOPHAGOGASTRODUODENOSCOPY) (N/A) as a surgical intervention.  The patient's history has been reviewed, patient examined, no change in status, stable for surgery.  I have reviewed the patient's chart and labs.  Questions were answered to the patient's satisfaction.     Kaye Mitro

## 2024-12-09 NOTE — Plan of Care (Signed)

## 2024-12-09 NOTE — Progress Notes (Signed)
 [facility] Progress Note Patient: ALINAH SHEARD FMW:[id number] DOB: 25-Dec-1956  DOA: 12/07/2024 DOS: the patient was seen and examined on 12/09/2024  Brief Hospital Course: PMH of cirrhosis, HTN, varices, breast cancer presented to the hospital with complaints of abnormal lab. Presented with weakness and shortness of breath to PCPs office.  Went for the blood work.  Hemoglobin was found to be 5.9.  Recommended for ED admission. GI consulted.  Underwent EGD. Assessment and Plan: Symptomatic anemia Presents with anemia. Hemoglobin 5.6 Transfused 2 PRBC. H&H relatively stable for now. GI consulted. Continue PPI, octreotide . Initiated on ceftriaxone  for SBP prophylaxis. Transfuse for hemoglobin less than 7. EGD shows retained food, prepyloric gastric ulcers, esophageal varices without any active bleeding. Scheduled for IV iron  outpatient. Eliquis  on hold.  Hemoglobin dropped again to 7.6.  Will recheck.  Monitor.   Idiopathic cirrhosis (HCC) Resume home regimen of spironolactone  and furosemide  tomorrow. Resume lactulose .  Starting on Aldactone .  Chronic anticoagulation Portal venous thrombosis. Resumption of Eliquis  per GI. Currently holding.  Ascites. 2 L of paracentesis on 12/10. Serum creatinine trended up.  Provided IV albumin . Monitor. Had Lasix  and Aldactone  held on 12/11.  Resumed on 12/12.  Subjective: See before the endoscopy.  No nausea no vomiting no acute complaint.  Physical Exam: Basal crackles. S1-S2 present Abdomen less distended.  Nontender.  Data Reviewed: I have Reviewed nursing notes, Vitals, and Lab results. Reviewed CBC and BMP.  Reviewed CBC and BMP.  Disposition: Status is: Inpatient Remains inpatient appropriate because: Monitor for improvement in H&H  SCDs Start: 12/07/24 1552  Family Communication: No one at bedside Level of care: Telemetry   Vitals:   12/09/24 1240 12/09/24 1250 12/09/24 1300 12/09/24 1321  BP: 124/68 125/72  137/73 139/61  Pulse: 84 77 77 74  Resp: (!) 22 (!) 22 18 15   Temp:    97.9 F (36.6 C)  TempSrc:    Oral  SpO2: 99% 98% 97% 100%  Weight:      Height:         Author: Yetta Blanch, MD 12/09/2024 6:43 PM  Please look on [email or web address] to find out who is on call.

## 2024-12-09 NOTE — Plan of Care (Signed)

## 2024-12-09 NOTE — Anesthesia Preprocedure Evaluation (Addendum)
 Anesthesia Evaluation  Patient identified by MRN, date of birth, ID band Patient awake    Reviewed: Allergy & Precautions, NPO status , Patient's Chart, lab work & pertinent test results, reviewed documented beta blocker date and time   History of Anesthesia Complications Negative for: history of anesthetic complications  Airway Mallampati: I  TM Distance: >3 FB Neck ROM: Full    Dental no notable dental hx. (+) Teeth Intact, Dental Advisory Given   Pulmonary  S/p lobectomy   Pulmonary exam normal        Cardiovascular hypertension, Pt. on home beta blockers + Peripheral Vascular Disease  Normal cardiovascular exam+ Valvular Problems/Murmurs      Neuro/Psych  Headaches PSYCHIATRIC DISORDERS  Depression       GI/Hepatic ,GERD  Medicated,,(+) Cirrhosis   Esophageal Varices      Endo/Other  negative endocrine ROS    Renal/GU negative Renal ROS     Musculoskeletal  (+) Arthritis ,    Abdominal   Peds  Hematology  (+) Blood dyscrasia, anemia   Anesthesia Other Findings Day of surgery medications reviewed with patient.  Reproductive/Obstetrics                              Anesthesia Physical Anesthesia Plan  ASA: 3  Anesthesia Plan: MAC   Post-op Pain Management: Minimal or no pain anticipated   Induction:   PONV Risk Score and Plan: 2 and Treatment may vary due to age or medical condition and Propofol  infusion  Airway Management Planned: Natural Airway and Simple Face Mask  Additional Equipment: None  Intra-op Plan:   Post-operative Plan:   Informed Consent: I have reviewed the patients History and Physical, chart, labs and discussed the procedure including the risks, benefits and alternatives for the proposed anesthesia with the patient or authorized representative who has indicated his/her understanding and acceptance.       Plan Discussed with: CRNA  Anesthesia Plan  Comments:          Anesthesia Quick Evaluation

## 2024-12-09 NOTE — Brief Op Note (Signed)
 12/09/2024  12:43 PM  PATIENT:  Nicole [name]  68 y.o. female  PRE-OPERATIVE DIAGNOSIS:  Anemia, Cirrhosis  POST-OPERATIVE DIAGNOSIS:  small esophageal varices, no bands placed  PROCEDURE:  Procedures: EGD (ESOPHAGOGASTRODUODENOSCOPY) (N/A)  SURGEON:  Surgeons and Role:    * Anothony Bursch, MD - Primary   Findings -------------- -EGD showed large amount of retained food in the stomach and few prepyloric tiny gastric ulcers. -EGD also showed small esophageal varices and stigmata of previous treatment.  I did not think varices were big enough for band ligation.  Recommendations ------------------------- - Start soft diet and advance as tolerated -Continue current management for now -Start carvedilol 3.125 mg twice daily for secondary prophylaxis -Repeat labs in the morning. -GI will follow  Layla Lah MD, FACP 12/09/2024, 12:45 PM  Contact #  [phone]

## 2024-12-10 LAB — CBC
HCT: 24.8 % — ABNORMAL LOW (ref 36.0–46.0)
HCT: 31.5 % — ABNORMAL LOW (ref 36.0–46.0)
Hemoglobin: 7.6 g/dL — ABNORMAL LOW (ref 12.0–15.0)
Hemoglobin: 9.4 g/dL — ABNORMAL LOW (ref 12.0–15.0)
MCH: 32.1 pg (ref 26.0–34.0)
MCH: 32.4 pg (ref 26.0–34.0)
MCHC: 30.6 g/dL (ref 30.0–36.0)
MCHC: 29.8 g/dL — ABNORMAL LOW (ref 30.0–36.0)
MCV: 104.6 fL — ABNORMAL HIGH (ref 80.0–100.0)
MCV: 108.6 fL — ABNORMAL HIGH (ref 80.0–100.0)
Platelets: 136 K/uL — ABNORMAL LOW (ref 150–400)
Platelets: 168 K/uL (ref 150–400)
RBC: 2.37 MIL/uL — ABNORMAL LOW (ref 3.87–5.11)
RBC: 2.9 MIL/uL — ABNORMAL LOW (ref 3.87–5.11)
RDW: 21 % — ABNORMAL HIGH (ref 11.5–15.5)
RDW: 21.2 % — ABNORMAL HIGH (ref 11.5–15.5)
WBC: 3.3 K/uL — ABNORMAL LOW (ref 4.0–10.5)
WBC: 4 K/uL (ref 4.0–10.5)
nRBC: 0.6 % — ABNORMAL HIGH (ref 0.0–0.2)
nRBC: 0.5 % — ABNORMAL HIGH (ref 0.0–0.2)

## 2024-12-10 LAB — BASIC METABOLIC PANEL WITH GFR
Anion gap: 12 (ref 5–15)
BUN: 10 mg/dL (ref 8–23)
CO2: 19 mmol/L — ABNORMAL LOW (ref 22–32)
Calcium: 7.3 mg/dL — ABNORMAL LOW (ref 8.9–10.3)
Chloride: 106 mmol/L (ref 98–111)
Creatinine, Ser: 0.96 mg/dL (ref 0.44–1.00)
GFR, Estimated: >60 mL/min (ref >60)
Glucose, Bld: 108 mg/dL — ABNORMAL HIGH (ref 70–99)
Potassium: 3.5 mmol/L (ref 3.5–5.1)
Sodium: 138 mmol/L (ref 135–145)

## 2024-12-10 LAB — MAGNESIUM: Magnesium: 2 mg/dL (ref 1.7–2.4)

## 2024-12-10 LAB — PREPARE RBC (CROSSMATCH)

## 2024-12-10 MED ORDER — VITAMIN B-12 1000 MCG PO TABS
1000.0000 ug | ORAL_TABLET | Freq: Every day | ORAL | Status: DC
  Administered 2024-12-11: 1000 ug via ORAL
  Filled 2024-12-10 (×2): qty 1

## 2024-12-10 MED ORDER — ORAL CARE MOUTH RINSE: 15.0000 mL | OROMUCOSAL | Status: DC | PRN

## 2024-12-10 MED ORDER — SODIUM CHLORIDE 0.9% IV SOLUTION: Freq: Once | INTRAVENOUS | Status: AC

## 2024-12-10 MED ADMIN — Carvedilol Tab 3.125 MG: 3.125 mg | ORAL | NDC 72888003401

## 2024-12-10 MED ADMIN — Spironolactone Tab 100 MG: 100 mg | ORAL | NDC 69584085410

## 2024-12-10 MED ADMIN — Pantoprazole Sodium EC Tab 40 MG (Base Equiv): 40 mg | ORAL | NDC 65862056099

## 2024-12-10 MED FILL — Pantoprazole Sodium EC Tab 40 MG (Base Equiv): 40.0000 mg | ORAL | Qty: 1 | Status: AC

## 2024-12-10 NOTE — Progress Notes (Signed)
 [facility] Progress Note  Nicole [name] 68 y.o. February 08, 1956  CC:  Anemia, Cirrhosis    Subjective: Patient seen and examined at bedside.  Underwent EGD yesterday and found to have small esophageal varices, sigmoid previous treatment, prepyloric gastric ulcers and large amount of retained food in the stomach.  Denies any bleeding.  ROS : Afebrile, negative for chest pain   Objective: Vital signs in last 24 hours: Vitals:   12/09/24 2027 12/10/24 0544  BP: 105/62 (!) 118/59  Pulse: 83 86  Resp:  12  Temp: 97.7 F (36.5 C) 98 F (36.7 C)  SpO2: 100% 96%    Physical Exam:  General:  Alert, cooperative, no distress, appears stated age  Head:  Normocephalic, without obvious abnormality, atraumatic  Eyes:  , EOM's intact,   Lungs:   No visible respiratory distress  Heart:  Regular rate and rhythm, S1, S2 normal  Abdomen:   Soft, non-tender, minimally distended, bowel sound present, no peritoneal signs   Mood and affect normal   Alert and oriented x 3    Lab Results: Recent Labs    12/09/24 0627 12/10/24 0556  NA 137 138  K 3.5 3.5  CL 105 106  CO2 21* 19*  GLUCOSE 115* 108*  BUN 11 10  CREATININE 1.22* 0.96  CALCIUM 7.5* 7.3*  MG 2.1 2.0   Recent Labs    12/07/24 1232  AST 25  ALT 19  ALKPHOS 66  BILITOT 0.7  PROT 6.2*  ALBUMIN  3.5   Recent Labs    12/07/24 1236 12/08/24 0713 12/09/24 1900 12/10/24 0556  WBC 5.6   < > 2.9* 3.3*  NEUTROABS 4.2  --   --   --   HGB 5.6*   < > 7.4* 7.6*  HCT 18.7*   < > 24.0* 24.8*  MCV 116.1*   < > 105.3* 104.6*  PLT 278   < > 123* 136*   < > = values in this interval not displayed.   Recent Labs    12/07/24 1614  LABPROT 29.2*  INR 2.6*      Assessment/Plan: -Symptomatic anemia with hemoglobin of 5.6 on admission.  History of decompensated cirrhosis complicated by esophageal varices requiring band ligation in the past.  No overt bleeding at this time. -Acute on chronic anemia - History of  portal vein thrombosis.  Was on Eliquis . - History of metastatic breast cancer   Recommendations ----------------------- - EGD yesterday showed small esophageal varices and stigmata of previous treatment, large amount of retained food in the stomach and few prepyloric gastric ulcer. - Started on carvedilol 3.125 mg twice daily, may need to titrate outpatient based on heart rate - Recommend 1 more unit of blood transfusion prior to discharge - She was advised to hold Eliquis  until seen by oncology which is scheduled for coming Tuesday.  If she does not see oncology on Tuesday then she can resume Eliquis  from GI standpoint on Tuesday. - Okay to discharge from GI standpoint later today or tomorrow. - Follow-up in GI clinic in 4 to 6 weeks after discharge. - GI will sign off.  Call us  back if needed.  Layla Lah MD, FACP 12/10/2024, 8:30 AM  Contact #  [phone]

## 2024-12-10 NOTE — Plan of Care (Signed)

## 2024-12-10 NOTE — Progress Notes (Signed)
°   12/10/24 1010  TOC Brief Assessment  Insurance and Status Reviewed  Patient has primary care physician Yes ([facility] [place])  Home environment has been reviewed Home with spouse  Prior level of function: Independent  Prior/Current Home Services No current home services  Social Drivers of Health Review SDOH reviewed no interventions necessary  Readmission risk has been reviewed Yes  Transition of care needs no transition of care needs at this time

## 2024-12-10 NOTE — Hospital Course (Signed)
 PMH of cirrhosis, HTN, varices, breast cancer presented to the hospital with complaints of abnormal lab. Presented with weakness and shortness of breath to PCPs office.  Went for the blood work.  Hemoglobin was found to be 5.9.  Recommended for ED admission. GI consulted.  Underwent EGD.  Assessment and Plan: Symptomatic anemia Presents with anemia. Hemoglobin 5.6 Transfused 2 PRBC. H&H relatively stable for now. GI consulted. Continue PPI, octreotide . Initiated on ceftriaxone  for SBP prophylaxis. Transfuse for hemoglobin less than 7. EGD shows retained food, prepyloric gastric ulcers, esophageal varices without any active bleeding. Scheduled for IV iron  outpatient. Eliquis  on hold.  Resume on 12/13.   Idiopathic cirrhosis (HCC) Currently on Aldactone  and Lasix . Continue lactulose .   Chronic anticoagulation Portal venous thrombosis. Resumption of Eliquis  per GI likely tomorrow. Consider stopping this medication if the patient continues to have recurrent episodes of GI bleed or anemia.  Ascites. 2 L of paracentesis on 12/10. Serum creatinine trended up.  Provided IV albumin . Back on Lasix  Aldactone .

## 2024-12-10 NOTE — Progress Notes (Signed)
 [facility] Progress Note Patient: Nicole [name] FMW:[id number] DOB: 16-Dec-1956  DOA: 12/07/2024 DOS: the patient was seen and examined on 12/10/2024  Brief Hospital Course: PMH of cirrhosis, HTN, varices, breast cancer presented to the hospital with complaints of abnormal lab. Presented with weakness and shortness of breath to PCPs office.  Went for the blood work.  Hemoglobin was found to be 5.9.  Recommended for ED admission. GI consulted.  Underwent EGD.  Assessment and Plan: Symptomatic anemia Presents with anemia. Hemoglobin 5.6 Transfused 2 PRBC. H&H relatively stable for now. GI consulted. Continue PPI, octreotide . Initiated on ceftriaxone  for SBP prophylaxis. Transfuse for hemoglobin less than 7. EGD shows retained food, prepyloric gastric ulcers, esophageal varices without any active bleeding. Scheduled for IV iron  outpatient. Eliquis  on hold.  Resume on 12/13.   Idiopathic cirrhosis (HCC) Currently on Aldactone  and Lasix . Continue lactulose .   Chronic anticoagulation Portal venous thrombosis. Resumption of Eliquis  per GI likely tomorrow. Consider stopping this medication if the patient continues to have recurrent episodes of GI bleed or anemia.  Ascites. 2 L of paracentesis on 12/10. Serum creatinine trended up.  Provided IV albumin . Back on Lasix  Aldactone .   Subjective: No nausea no vomiting no fever no chills.  Had a BM without blood.  Physical Exam: Bowel sound present. Clear to auscultation. Nontender.  Data Reviewed: I have Reviewed nursing notes, Vitals, and Lab results. Since last encounter, pertinent lab results CBC and BMP   . I have ordered test including CBC and BMP  . I have discussed pt's care plan and test results with GI  .   Disposition: Status is: Inpatient Remains inpatient appropriate because: Overnight observation per GI.  SCDs Start: 12/07/24 1552   Family Communication: No one at bedside Level of care: Telemetry    Vitals:   12/10/24 1201 12/10/24 1746 12/10/24 1800 12/10/24 1801  BP: 136/69 (!) 107/92  (!) 107/92  Pulse: 83 80  76  Resp: 15 19 15 19   Temp: 97.7 F (36.5 C) 98.4 F (36.9 C)  98.4 F (36.9 C)  TempSrc:  Oral  Oral  SpO2: 99% 100%  98%  Weight:      Height:         Author: Yetta Blanch, MD 12/10/2024 6:41 PM  Please look on [email or web address] to find out who is on call.

## 2024-12-11 DIAGNOSIS — D649 Anemia, unspecified: Secondary | ICD-10-CM | POA: Diagnosis not present

## 2024-12-11 LAB — CBC
HCT: 30.6 % — ABNORMAL LOW (ref 36.0–46.0)
Hemoglobin: 9.8 g/dL — ABNORMAL LOW (ref 12.0–15.0)
MCH: 32.6 pg (ref 26.0–34.0)
MCHC: 32 g/dL (ref 30.0–36.0)
MCV: 101.7 fL — ABNORMAL HIGH (ref 80.0–100.0)
Platelets: 128 K/uL — ABNORMAL LOW (ref 150–400)
RBC: 3.01 MIL/uL — ABNORMAL LOW (ref 3.87–5.11)
RDW: 20.2 % — ABNORMAL HIGH (ref 11.5–15.5)
WBC: 3 K/uL — ABNORMAL LOW (ref 4.0–10.5)
nRBC: 0 % (ref 0.0–0.2)

## 2024-12-11 LAB — BASIC METABOLIC PANEL WITH GFR
Anion gap: 9 (ref 5–15)
BUN: 9 mg/dL (ref 8–23)
CO2: 21 mmol/L — ABNORMAL LOW (ref 22–32)
Calcium: 6.9 mg/dL — ABNORMAL LOW (ref 8.9–10.3)
Chloride: 109 mmol/L (ref 98–111)
Creatinine, Ser: 0.92 mg/dL (ref 0.44–1.00)
GFR, Estimated: >60 mL/min (ref >60)
Glucose, Bld: 99 mg/dL (ref 70–99)
Potassium: 3.3 mmol/L — ABNORMAL LOW (ref 3.5–5.1)
Sodium: 140 mmol/L (ref 135–145)

## 2024-12-11 LAB — MAGNESIUM: Magnesium: 2.2 mg/dL (ref 1.7–2.4)

## 2024-12-11 MED ORDER — PANTOPRAZOLE SODIUM 40 MG PO TBEC: 40.0000 mg | DELAYED_RELEASE_TABLET | Freq: Two times a day (BID) | ORAL | 0 refills | Status: AC

## 2024-12-11 MED ORDER — POTASSIUM CHLORIDE CRYS ER 20 MEQ PO TBCR
40.0000 meq | EXTENDED_RELEASE_TABLET | Freq: Once | ORAL | Status: AC
  Administered 2024-12-11: 40 meq via ORAL
  Filled 2024-12-11: qty 2

## 2024-12-11 MED ORDER — CARVEDILOL 3.125 MG PO TABS: 3.1250 mg | ORAL_TABLET | Freq: Two times a day (BID) | ORAL | 0 refills | Status: DC

## 2024-12-11 MED ORDER — POTASSIUM CHLORIDE CRYS ER 20 MEQ PO TBCR: 40.0000 meq | EXTENDED_RELEASE_TABLET | ORAL | Status: DC

## 2024-12-11 MED ADMIN — Spironolactone Tab 100 MG: 100 mg | ORAL | NDC 69584085410

## 2024-12-11 MED ADMIN — Carvedilol Tab 3.125 MG: 3.125 mg | ORAL | NDC 72888003401

## 2024-12-11 MED ADMIN — Pantoprazole Sodium EC Tab 40 MG (Base Equiv): 40 mg | ORAL | NDC 65862056099

## 2024-12-11 NOTE — Plan of Care (Signed)

## 2024-12-11 NOTE — Discharge Summary (Signed)
 Physician Discharge Summary   Patient: Nicole [name] MRN: [id number] DOB: 1956-09-12  Admit date:     12/07/2024  Discharge date: 12/11/2024  Discharge Physician: Yetta Blanch  PCP: [facility] [place]  Recommendations at discharge: Follow-up with PCP in 1 week with a CBC and BMP. Follow-up with GI as recommended.   Follow-up Information     [facility] Schedule an appointment as soon as possible for a visit in 6 week(s).   Why: Follow-up for cirrhosis, GI bleed, anemia Contact information: [address] [place] [address] [phone]         [facility] [place]. Schedule an appointment as soon as possible for a visit in 2 week(s).   Specialty: Family Medicine Why: with BMP lab to look at kidney/electrolyte numbers, with CBC lab to look at blood counts Contact information: [address] [place] [address] [phone]                Hospital Course: PMH of cirrhosis, HTN, varices, breast cancer presented to the hospital with complaints of abnormal lab. Presented with weakness and shortness of breath to PCPs office.  Went for the blood work.  Hemoglobin was found to be 5.9.  Recommended for ED admission. GI consulted.  Underwent EGD.  Assessment and Plan: Symptomatic anemia Presents with anemia. Hemoglobin 5.6 Transfused 2 PRBC. H&H relatively stable for now. GI consulted. Continue PPI, octreotide . Initiated on ceftriaxone  for SBP prophylaxis. Transfuse for hemoglobin less than 7. EGD shows retained food, prepyloric gastric ulcers, esophageal varices without any active bleeding. Scheduled for IV iron  outpatient. Eliquis  on hold.  Resume on 12/13.   Idiopathic cirrhosis (HCC) Currently on Aldactone  and Lasix . Continue lactulose .   Chronic anticoagulation Portal venous thrombosis. Resumption of Eliquis  per GI likely tomorrow. Consider stopping this medication if the patient  continues to have recurrent episodes of GI bleed or anemia.  Ascites. 2 L of paracentesis on 12/10. Serum creatinine trended up.  Provided IV albumin . Back on Lasix  Aldactone .  Consultants:  Gastroenterology   Procedures performed:  EGD  DISCHARGE MEDICATION: Allergies as of 12/11/2024   No Known Allergies      Medication List     STOP taking these medications    propranolol  20 MG tablet Commonly known as: INDERAL        TAKE these medications    carvedilol  3.125 MG tablet Commonly known as: COREG  Take 1 tablet (3.125 mg total) by mouth 2 (two) times daily with a meal.   cyanocobalamin  1000 MCG tablet Commonly known as: VITAMIN B12 Take 1,000 mcg by mouth daily after breakfast.   denosumab  60 MG/ML Soln injection Commonly known as: PROLIA  Inject 60 mg into the skin every 6 (six) months. Administer in upper arm, thigh, or abdomen   Eliquis  5 MG Tabs tablet Generic drug: apixaban  TAKE 1 TABLET BY MOUTH TWICE A DAY What changed: when to take this   FeroSul 325 (65 Fe) MG tablet Generic drug: ferrous sulfate  Take 1 tablet (325 mg total) by mouth daily before breakfast. What changed: when to take this   fulvestrant  250 MG/5ML injection Commonly known as: FASLODEX  Inject 500 mg into the muscle every 28 (twenty-eight) days.   furosemide  40 MG tablet Commonly known as: LASIX  Take 40 mg by mouth in the morning.   Ibrance  75 MG tablet Generic drug: palbociclib  Take 1 tablet (75 mg total) by mouth daily. Take for 21 days on, 7 days off, repeat  every 28 days.   Lactulose  20 GM/30ML Soln Take 30 mLs (20 g total) by mouth 2 (two) times daily.   Milk Thistle 1000 MG Caps Take 1,000 mg by mouth daily.   pantoprazole  40 MG tablet Commonly known as: PROTONIX  Take 1 tablet (40 mg total) by mouth 2 (two) times daily before a meal. What changed: when to take this   spironolactone  100 MG tablet Commonly known as: ALDACTONE  Take 100 mg by mouth in the morning.    venlafaxine  XR 150 MG 24 hr capsule Commonly known as: EFFEXOR -XR TAKE 1 CAPSULE BY MOUTH DAILY WITH BREAKFAST.       Disposition: Home Diet recommendation: Cardiac diet  Discharge Exam: Vitals:   12/10/24 1801 12/10/24 2034 12/11/24 0448 12/11/24 0842  BP: (!) 107/92 125/66 106/61 (!) 143/71  Pulse: 76 76 81 75  Resp: 19 16 14    Temp: 98.4 F (36.9 C) 98.1 F (36.7 C) 98 F (36.7 C)   TempSrc: Oral Oral Oral   SpO2: 98% 100% 98%   Weight:      Height:       General: in Mild distress, No Rash Cardiovascular: S1 and S2 Present, No Murmur Respiratory: Good respiratory effort, Bilateral Air entry present. No Crackles, No wheezes Abdomen: Bowel Sound present, No tenderness Extremities: No edema Neuro: Alert and oriented x3, no new focal deficit   Filed Weights   12/07/24 1709  Weight: 58.4 kg   Condition at discharge: stable  The results of significant diagnostics from this hospitalization (including imaging, microbiology, ancillary and laboratory) are listed below for reference.   Imaging Studies: US  Paracentesis Result Date: 12/08/2024 INDICATION: 68 year old female. History of metastatic breast cancer, MASH, cirrhosis with portal hypertension and esophageal varices with recurrent ascites. Request is for therapeutic paracentesis. 6 L max EXAM: ULTRASOUND GUIDED THERAPEUTIC PARACENTESIS MEDICATIONS: Lidocaine  1% 10 mL COMPLICATIONS: None immediate. PROCEDURE: Informed written consent was obtained from the patient after a discussion of the risks, benefits and alternatives to treatment. A timeout was performed prior to the initiation of the procedure. Initial ultrasound scanning demonstrates a small amount of ascites within the right lower abdominal quadrant. The right lower abdomen was prepped and draped in the usual sterile fashion. 1% lidocaine  was used for local anesthesia. Following this, a 19 gauge, 7-cm, Yueh catheter was introduced. An ultrasound image was saved for  documentation purposes. The paracentesis was performed. The catheter was removed and a dressing was applied. The patient tolerated the procedure well without immediate post procedural complication. Patient received post-procedure intravenous albumin ; see nursing notes for details. FINDINGS: A total of approximately 2 L of straw-colored fluid was removed. IMPRESSION: Successful ultrasound-guided paracentesis yielding 2.0 L of peritoneal fluid. Performed by Delon Beagle NP PLAN: The patient has required >/=2 paracenteses in a 30 day period and a formal evaluation by the [facility] has been arranged. Thom Hall, MD [facility] Radiology Electronically Signed   By: Thom Hall M.D.   On: 12/08/2024 14:35   CT ABDOMEN PELVIS W CONTRAST Result Date: 11/23/2024 EXAM: CT ABDOMEN AND PELVIS WITH CONTRAST 11/18/2024 11:52:44 AM TECHNIQUE: CT of the abdomen and pelvis was performed with the administration of 80 mL of iopamidol  (ISOVUE -370) 76% injection. Multiplanar reformatted images are provided for review. Automated exposure control, iterative reconstruction, and/or weight-based adjustment of the mA/kV was utilized to reduce the radiation dose to as low as reasonably achievable. COMPARISON: PET CT 09/16/2024, limited ultrasound 05/30/2023. CLINICAL HISTORY: UNINTENDED  WEIGHT LOSS , HEPATIC CIRRHOSIS FINDINGS: LOWER CHEST: Small bilateral pleural effusions. Subpleural reticulation and scarring on the right consistent with posttherapeutic change. Postoperative changes of the left lower lobe. Post mastectomy changes on the right. LIVER: Hepatic cirrhosis. GALLBLADDER AND BILE DUCTS: Gallbladder is unremarkable. No biliary ductal dilatation. SPLEEN: Splenomegaly, seen measures 13 cm. PANCREAS: No acute abnormality. ADRENAL GLANDS: No acute abnormality. KIDNEYS, URETERS AND BLADDER: No stones in the kidneys or  ureters. No hydronephrosis. No perinephric or periureteral stranding. Urinary bladder is unremarkable. GI AND BOWEL: Distal esophageal varices and small hiatal hernia. Embolization coils with artifact near the gastric cardia. Large quantity of stool in the colon. There is no bowel obstruction. PERITONEUM AND RETROPERITONEUM: Moderate volume of abdominal pelvic ascites. No free air. No overt peritoneal nodularity. VASCULATURE: Recanalized periumbilical vein. Mild aortic atherosclerosis. Aorta is normal in caliber. LYMPH NODES: No lymphadenopathy. REPRODUCTIVE ORGANS: No acute abnormality. BONES AND SOFT TISSUES: Chronic L1 compression fracture. Probable chronic L2 compression fracture with sclerosis. Multilevel degenerative changes. Post mastectomy changes on the right. IMPRESSION: 1. Hepatic cirrhosis with associated findings of portal hypertension. Moderate volume abdominal pelvic ascites. 2. Postmastectomy changes on the right. 3. Small bilateral pleural effusions Electronically signed by: Luke Bun MD 11/23/2024 10:46 PM EST RP Workstation: [id number]    Microbiology: Results for orders placed or performed during the hospital encounter of 03/03/24  Resp panel by RT-PCR (RSV, Flu A&B, Covid) Anterior Nasal Swab     Status: None   Collection Time: 03/03/24  5:24 PM   Specimen: Anterior Nasal Swab  Result Value Ref Range Status   SARS Coronavirus 2 by RT PCR NEGATIVE NEGATIVE Final    Comment: (NOTE) SARS-CoV-2 target nucleic acids are NOT DETECTED.  The SARS-CoV-2 RNA is generally detectable in upper respiratory specimens during the acute phase of infection. The lowest concentration of SARS-CoV-2 viral copies this assay can detect is 138 copies/mL. A negative result does not preclude SARS-Cov-2 infection and should not be used as the sole basis for treatment or other patient management decisions. A negative result may occur with  improper specimen collection/handling, submission of specimen  other than nasopharyngeal swab, presence of viral mutation(s) within the areas targeted by this assay, and inadequate number of viral copies(<138 copies/mL). A negative result must be combined with clinical observations, patient history, and epidemiological information. The expected result is Negative.  Fact Sheet for Patients:  bloggercourse.com  Fact Sheet for Healthcare Providers:  seriousbroker.it  This test is no t yet approved or cleared by the [place]  FDA and  has been authorized for detection and/or diagnosis of SARS-CoV-2 by FDA under an Emergency Use Authorization (EUA). This EUA will remain  in effect (meaning this test can be used) for the duration of the COVID-19 declaration under Section 564(b)(1) of the Act, 21 U.S.C.section 360bbb-3(b)(1), unless the authorization is terminated  or revoked sooner.       Influenza A by PCR NEGATIVE NEGATIVE Final   Influenza B by PCR NEGATIVE NEGATIVE Final    Comment: (NOTE) The Xpert Xpress SARS-CoV-2/FLU/RSV plus assay is intended as an aid in the diagnosis of influenza from Nasopharyngeal swab specimens and should not be used as a sole basis for treatment. Nasal washings and aspirates are unacceptable for Xpert Xpress SARS-CoV-2/FLU/RSV testing.  Fact Sheet for Patients: bloggercourse.com  Fact Sheet for Healthcare Providers: seriousbroker.it  This test is not yet approved or cleared by the [place]  FDA and has been authorized for detection and/or diagnosis of SARS-CoV-2 by FDA  under an Emergency Use Authorization (EUA). This EUA will remain in effect (meaning this test can be used) for the duration of the COVID-19 declaration under Section 564(b)(1) of the Act, 21 U.S.C. section 360bbb-3(b)(1), unless the authorization is terminated or revoked.     Resp Syncytial Virus by PCR NEGATIVE NEGATIVE Final     Comment: (NOTE) Fact Sheet for Patients: bloggercourse.com  Fact Sheet for Healthcare Providers: seriousbroker.it  This test is not yet approved or cleared by the [place]  FDA and has been authorized for detection and/or diagnosis of SARS-CoV-2 by FDA under an Emergency Use Authorization (EUA). This EUA will remain in effect (meaning this test can be used) for the duration of the COVID-19 declaration under Section 564(b)(1) of the Act, 21 U.S.C. section 360bbb-3(b)(1), unless the authorization is terminated or revoked.  Performed at [facility], [address] [place], [place] [address]    *Note: Due to a large number of results and/or encounters for the requested time period, some results have not been displayed. A complete set of results can be found in Results Review.   Labs: CBC: Recent Labs  Lab 12/07/24 1236 12/08/24 0713 12/09/24 1643 12/09/24 1900 12/10/24 0556 12/10/24 1654 12/11/24 0737  WBC 5.6   < > 3.4* 2.9* 3.3* 4.0 3.0*  NEUTROABS 4.2  --   --   --   --   --   --   HGB 5.6*   < > 7.6* 7.4* 7.6* 9.4* 9.8*  HCT 18.7*   < > 24.0* 24.0* 24.8* 31.5* 30.6*  MCV 116.1*   < > 103.9* 105.3* 104.6* 108.6* 101.7*  PLT 278   < > 121* 123* 136* 168 128*   < > = values in this interval not displayed.   Basic Metabolic Panel: Recent Labs  Lab 12/07/24 1232 12/08/24 0713 12/09/24 0627 12/10/24 0556 12/11/24 0737  NA 137 137 137 138 140  K 3.9 3.7 3.5 3.5 3.3*  CL 104 105 105 106 109  CO2 20* 20* 21* 19* 21*  GLUCOSE 109* 114* 115* 108* 99  BUN 14 12 11 10 9   CREATININE 1.05* 1.03* 1.22* 0.96 0.92  CALCIUM 8.5* 7.9* 7.5* 7.3* 6.9*  MG  --   --  2.1 2.0 2.2   Liver Function Tests: Recent Labs  Lab 12/07/24 1232  AST 25  ALT 19  ALKPHOS 66  BILITOT 0.7  PROT 6.2*  ALBUMIN  3.5   CBG: No results for input(s): GLUCAP in the last 168 hours.  Discharge time spent: 35  minutes  Author: Yetta Blanch, MD  [facility] 12/11/2024

## 2024-12-12 ENCOUNTER — Encounter (HOSPITAL_COMMUNITY): Payer: Self-pay | Admitting: Gastroenterology

## 2024-12-13 ENCOUNTER — Other Ambulatory Visit: Payer: Self-pay

## 2024-12-13 ENCOUNTER — Other Ambulatory Visit (HOSPITAL_COMMUNITY): Payer: Self-pay

## 2024-12-13 DIAGNOSIS — D696 Thrombocytopenia, unspecified: Secondary | ICD-10-CM

## 2024-12-13 DIAGNOSIS — D500 Iron deficiency anemia secondary to blood loss (chronic): Secondary | ICD-10-CM

## 2024-12-13 DIAGNOSIS — D509 Iron deficiency anemia, unspecified: Secondary | ICD-10-CM

## 2024-12-13 DIAGNOSIS — M810 Age-related osteoporosis without current pathological fracture: Secondary | ICD-10-CM

## 2024-12-13 DIAGNOSIS — Z170 Estrogen receptor positive status [ER+]: Secondary | ICD-10-CM

## 2024-12-13 LAB — BPAM RBC
Blood Product Expiration Date: 202512312359
Blood Product Expiration Date: 202601142359
Blood Product Unit Number: 202601142359
ISSUE DATE / TIME: 202512100020
ISSUE DATE / TIME: 202512121740
PRODUCT CODE: 202512100256
PRODUCT CODE: 202601142359
Unit Type and Rh: 202601142359
Unit Type and Rh: 5100
Unit Type and Rh: 5100
Unit Type and Rh: 5100
Unit Type and Rh: 5100

## 2024-12-13 LAB — TYPE AND SCREEN
ABO/RH(D): O POS
Antibody Screen: POSITIVE
PT AG Type: NEGATIVE
Unit division: 0
Unit division: 0
Unit division: 0

## 2024-12-14 ENCOUNTER — Inpatient Hospital Stay

## 2024-12-14 ENCOUNTER — Other Ambulatory Visit (HOSPITAL_COMMUNITY): Payer: Self-pay

## 2024-12-14 ENCOUNTER — Other Ambulatory Visit: Payer: Self-pay

## 2024-12-14 ENCOUNTER — Inpatient Hospital Stay: Admitting: Hematology and Oncology

## 2024-12-14 VITALS — BP 129/71 | HR 94 | Temp 97.7°F | Resp 15 | Wt 126.6 lb

## 2024-12-14 DIAGNOSIS — D649 Anemia, unspecified: Secondary | ICD-10-CM

## 2024-12-14 DIAGNOSIS — R161 Splenomegaly, not elsewhere classified: Secondary | ICD-10-CM | POA: Diagnosis not present

## 2024-12-14 DIAGNOSIS — Z5111 Encounter for antineoplastic chemotherapy: Secondary | ICD-10-CM | POA: Diagnosis present

## 2024-12-14 DIAGNOSIS — Z1721 Progesterone receptor positive status: Secondary | ICD-10-CM | POA: Diagnosis not present

## 2024-12-14 DIAGNOSIS — Z1732 Human epidermal growth factor receptor 2 negative status: Secondary | ICD-10-CM | POA: Diagnosis not present

## 2024-12-14 DIAGNOSIS — C799 Secondary malignant neoplasm of unspecified site: Secondary | ICD-10-CM

## 2024-12-14 DIAGNOSIS — Z170 Estrogen receptor positive status [ER+]: Secondary | ICD-10-CM | POA: Diagnosis not present

## 2024-12-14 DIAGNOSIS — D696 Thrombocytopenia, unspecified: Secondary | ICD-10-CM

## 2024-12-14 DIAGNOSIS — C7801 Secondary malignant neoplasm of right lung: Secondary | ICD-10-CM | POA: Diagnosis not present

## 2024-12-14 DIAGNOSIS — D509 Iron deficiency anemia, unspecified: Secondary | ICD-10-CM

## 2024-12-14 DIAGNOSIS — Z86718 Personal history of other venous thrombosis and embolism: Secondary | ICD-10-CM | POA: Diagnosis not present

## 2024-12-14 DIAGNOSIS — K7460 Unspecified cirrhosis of liver: Secondary | ICD-10-CM | POA: Diagnosis not present

## 2024-12-14 DIAGNOSIS — Z853 Personal history of malignant neoplasm of breast: Secondary | ICD-10-CM | POA: Diagnosis not present

## 2024-12-14 DIAGNOSIS — C50411 Malignant neoplasm of upper-outer quadrant of right female breast: Secondary | ICD-10-CM | POA: Diagnosis not present

## 2024-12-14 DIAGNOSIS — M810 Age-related osteoporosis without current pathological fracture: Secondary | ICD-10-CM | POA: Diagnosis not present

## 2024-12-14 DIAGNOSIS — R188 Other ascites: Secondary | ICD-10-CM

## 2024-12-14 DIAGNOSIS — E611 Iron deficiency: Secondary | ICD-10-CM | POA: Diagnosis not present

## 2024-12-14 DIAGNOSIS — C778 Secondary and unspecified malignant neoplasm of lymph nodes of multiple regions: Secondary | ICD-10-CM | POA: Diagnosis not present

## 2024-12-14 DIAGNOSIS — I81 Portal vein thrombosis: Secondary | ICD-10-CM | POA: Diagnosis not present

## 2024-12-14 DIAGNOSIS — K766 Portal hypertension: Secondary | ICD-10-CM | POA: Diagnosis not present

## 2024-12-14 DIAGNOSIS — C7802 Secondary malignant neoplasm of left lung: Secondary | ICD-10-CM | POA: Diagnosis not present

## 2024-12-14 LAB — CBC WITH DIFFERENTIAL (CANCER CENTER ONLY)
Abs Immature Granulocytes: 0.15 K/uL — ABNORMAL HIGH (ref 0.00–0.07)
Basophils Absolute: 0.1 K/uL (ref 0.0–0.1)
Basophils Relative: 1 %
Eosinophils Absolute: 0.1 K/uL (ref 0.0–0.5)
Eosinophils Relative: 2 %
HCT: 35.1 % — ABNORMAL LOW (ref 36.0–46.0)
Hemoglobin: 11.4 g/dL — ABNORMAL LOW (ref 12.0–15.0)
Immature Granulocytes: 2 %
Lymphocytes Relative: 7 %
Lymphs Abs: 0.4 K/uL — ABNORMAL LOW (ref 0.7–4.0)
MCH: 32 pg (ref 26.0–34.0)
MCHC: 32.5 g/dL (ref 30.0–36.0)
MCV: 98.6 fL (ref 80.0–100.0)
Monocytes Absolute: 0.8 K/uL (ref 0.1–1.0)
Monocytes Relative: 12 %
Neutro Abs: 4.8 K/uL (ref 1.7–7.7)
Neutrophils Relative %: 76 %
Platelet Count: 226 K/uL (ref 150–400)
RBC: 3.56 MIL/uL — ABNORMAL LOW (ref 3.87–5.11)
RDW: 18.6 % — ABNORMAL HIGH (ref 11.5–15.5)
WBC Count: 6.3 K/uL (ref 4.0–10.5)
nRBC: 0 % (ref 0.0–0.2)

## 2024-12-14 LAB — SAMPLE TO BLOOD BANK

## 2024-12-14 LAB — FERRITIN: Ferritin: 81 ng/mL (ref 11–307)

## 2024-12-14 LAB — IRON AND IRON BINDING CAPACITY (CC-WL,HP ONLY)
Iron: 55 ug/dL (ref 28–170)
Saturation Ratios: 15 % (ref 10.4–31.8)
TIBC: 372 ug/dL (ref 250–450)
UIBC: 318 ug/dL

## 2024-12-14 MED ORDER — APIXABAN 2.5 MG PO TABS: 2.5000 mg | ORAL_TABLET | Freq: Two times a day (BID) | ORAL | 3 refills | Status: AC

## 2024-12-14 MED ORDER — FULVESTRANT 250 MG/5ML IM SOSY
500.0000 mg | PREFILLED_SYRINGE | Freq: Once | INTRAMUSCULAR | Status: AC
  Administered 2024-12-14: 12:00:00 500 mg via INTRAMUSCULAR
  Filled 2024-12-14: qty 10

## 2024-12-14 MED ORDER — DENOSUMAB 60 MG/ML ~~LOC~~ SOSY: 60.0000 mg | PREFILLED_SYRINGE | Freq: Once | SUBCUTANEOUS | Status: DC

## 2024-12-14 NOTE — Progress Notes (Signed)
 Clinical Intervention Note  Clinical Intervention Notes: Patient reported missing 7 doses of Ibrance  due to being hospitalized with low hemoglobin and resumed medication after discharge. Per office visit today with Dr. Loretha, ok for patient to continue Ibrance .   Clinical Intervention Outcomes: Improved therapy adherence   [facility] Specialty Pharmacist

## 2024-12-14 NOTE — Progress Notes (Signed)
 [facility]  Telephone:([phone] Fax:([phone]    ID: Nicole [name]   DOB: 14-Feb-1956  MR#: [id number]  RDW#:[id number]  Patient Care Team: [facility] [place] as PCP - General (Family Medicine) Hays Morrison, CRNP as Nurse Practitioner (Nurse Practitioner) Ebbie Cough, MD as Consulting Physician (General Surgery) Saintclair Jasper, MD as Consulting Physician (Gastroenterology) Loretha Ash, MD as Consulting Physician (Hematology and Oncology)  CHIEF COMPLAINT:  Metastatic Breast Cancer (s/p right mastectomy)  CURRENT TREATMENT: Ibrance  and Faslodex , prolia  every 6 months.  INTERVAL HISTORY:  Discussed the use of AI scribe software for clinical note transcription with the patient, who gave verbal consent to proceed. History of Present Illness  Nicole [name] is a 68 year old female with metastatic ER-positive breast cancer, decompensated cirrhosis, and chronic portal vein thrombosis who presents for oncology follow-up regarding anticoagulation management and symptoms related to liver disease.  She has metastatic ER-positive breast cancer and restarted Ibrance  yesterday after a brief interruption due to an upper respiratory infection. She also gets monthly faslodex .  She has a history of cirrhosis, recurrent ascites, and portal hypertension. She experiences significant abdominal distension and tightness, with fluid accumulation recurring after recent paracentesis. She has had intermittent bleeding episodes and her current hemoglobin is 11.4 g/dL. She has chronic portal vein thrombosis and is receiving Eliquis  for anticoagulation. She denies peripheral edema and changes in mental status.  She reports ongoing weakness, which she attributes to inactivity and depression. She takes Effexor  for depression but feels it is no longer effective and is concerned about withdrawal symptoms if discontinued abruptly.  She has not received COVID or  influenza vaccinations this season. She had multiple bowel movements yesterday after taking lactulose  for constipation. No other new symptoms or complaints are reported.    COVID 19 VACCINATION STATUS: Status post Pfizer x2+ booster August 2021   BREAST CANCER HISTORY: From Dr. Maude Rubin's 07/25/2010 note:  This woman has not had any significant medical intervention for some time.  Her last mammogram was about seven years ago.  She noted a right breast mass about a year ago and did not seek immediate medical attention for this.  She ultimately, after some time, admitted this to her husband and self-referred herself for intervention.  She had a mammogram on 07/17/2010 with an ultrasound of the right breast.  This showed a lobulated mass upper right outer quadrant measuring at least 6.3 x 7.3 cm and large right axillary lymph nodes were also noted.  There was skin thickening overlying the mass.  On physical exam, this mass was about an 8 cm with skin dimpling noted. Discolored area in the skin over the mass, fullness in the axilla.  Biopsy was recommended, which took place on 07/17/2010.  Pathology showed invasive ductal cancer involving both lymph node and breast.  The HER-2 was not amplified.  ER and PR both positive at 100% and 6% respectively.  Proliferative index was 12% involving both the lymph node and breast mass.  An MRI scan of both breasts was performed on 07/22/2010, essentially which showed a large heterogeneously enhancing mass of the right breast with washout kinetics measuring 6.4 x 4.4 x 5.0 cm.  Numerous satellite nodules were seen throughout the dominant mass.  There were several suspicious nodules in the upper central and upper inner quadrant of the right breast.  There were two enhancing subcentimeter nodules seen in the right pectoralis muscle.  No obvious chest wall nodules were seen; however, there was seen  some metastatic adenopathy in the mediastinum as well as hila with a 2.4 x  2.0 cm mediastinal lymph node as well as 1.2 x 3.0 cm subcarinal lymph node.  There was a right hilar and probable left hilar adenopathy.  Bilateral pulmonary nodules were seen and a right T2 lesion was seen anterior right liver as well.  Her subsequent history is as detailed below.   PAST MEDICAL HISTORY: Past Medical History:  Diagnosis Date   Anemia    low iron    Arthritis    fingers   Blood transfusion 2012   Breast CA (HCC)    breast ca dx 06/2010, stage 4, right , Lung Metasis.Surgery, Chemo, Radiation   Breast cancer (HCC)    Clot    jugular  ?2015   Depression    Esophageal varices (HCC)    Headache    Mirgraine- rare since menopause   Heart murmur    mild, no cardiologist, from birth   Hematemesis 08/2017   History of kidney stones    no pain- shows in CT- small 1 mm bilaterally- kidney   Hypertension    IBD (inflammatory bowel disease)    resolved   Idiopathic cirrhosis (HCC)    pt denies   Peripheral vascular disease few yrs ago   clot in right jugular   Portal hypertension (HCC)    Radiation 07/21/12-09/07/12   5940 cGy   Restless legs     PAST SURGICAL HISTORY: Past Surgical History:  Procedure Laterality Date   BIOPSY  08/21/2020   Procedure: BIOPSY;  Surgeon: Saintclair Jasper, MD;  Location: [facility] ENDOSCOPY;  Service: Gastroenterology;;   COLONOSCOPY  2016   DILATION AND CURETTAGE OF UTERUS  2004?   ESOPHAGEAL BANDING  09/04/2017   Procedure: ESOPHAGEAL BANDING;  Surgeon: Saintclair Jasper, MD;  Location: [facility] ENDOSCOPY;  Service: Gastroenterology;;   ESOPHAGEAL BANDING  11/27/2017   Procedure: ESOPHAGEAL BANDING;  Surgeon: Saintclair Jasper, MD;  Location: [facility] ENDOSCOPY;  Service: Gastroenterology;;   ESOPHAGEAL BANDING N/A 04/28/2018   Procedure: ESOPHAGEAL BANDING;  Surgeon: Saintclair Jasper, MD;  Location: [facility]OSCOPY;  Service: Gastroenterology;  Laterality: N/A;   ESOPHAGEAL BANDING  11/17/2018   Procedure: ESOPHAGEAL BANDING;  Surgeon: Saintclair Jasper, MD;  Location: [facility];  Service: Gastroenterology;;   ESOPHAGEAL BANDING  08/16/2019   Procedure: ESOPHAGEAL BANDING;  Surgeon: Saintclair Jasper, MD;  Location: [facility];  Service: Gastroenterology;;   ESOPHAGEAL BANDING N/A 08/21/2020   Procedure: ESOPHAGEAL BANDING;  Surgeon: Saintclair Jasper, MD;  Location: [facility];  Service: Gastroenterology;  Laterality: N/A;   ESOPHAGEAL BANDING  10/21/2023   Procedure: ESOPHAGEAL BANDING;  Surgeon: Saintclair Jasper, MD;  Location: [facility];  Service: Gastroenterology;;   ESOPHAGOGASTRODUODENOSCOPY N/A 04/28/2018   Procedure: ESOPHAGOGASTRODUODENOSCOPY (EGD);  Surgeon: Saintclair Jasper, MD;  Location: [facility];  Service: Gastroenterology;  Laterality: N/A;   ESOPHAGOGASTRODUODENOSCOPY N/A 11/17/2018   Procedure: ESOPHAGOGASTRODUODENOSCOPY (EGD);  Surgeon: Saintclair Jasper, MD;  Location: [facility];  Service: Gastroenterology;  Laterality: N/A;   ESOPHAGOGASTRODUODENOSCOPY N/A 08/13/2022   Procedure: ESOPHAGOGASTRODUODENOSCOPY (EGD);  Surgeon: Saintclair Jasper, MD;  Location: [facility];  Service: Gastroenterology;  Laterality: N/A;   ESOPHAGOGASTRODUODENOSCOPY N/A 12/09/2024   Procedure: EGD (ESOPHAGOGASTRODUODENOSCOPY);  Surgeon: Elicia Claw, MD;  Location: [facility];  Service: Gastroenterology;  Laterality: N/A;   ESOPHAGOGASTRODUODENOSCOPY (EGD) WITH PROPOFOL  N/A 04/11/2016   Procedure: ESOPHAGOGASTRODUODENOSCOPY (EGD) WITH PROPOFOL ;  Surgeon: Oliva Boots, MD;  Location: [facility];  Service: Endoscopy;  Laterality: N/A;   ESOPHAGOGASTRODUODENOSCOPY (EGD) WITH PROPOFOL  N/A 07/08/2016  Procedure: ESOPHAGOGASTRODUODENOSCOPY (EGD) WITH PROPOFOL ;  Surgeon: Lynwood Bohr, MD;  Location: [facility] ENDOSCOPY;  Service: Endoscopy;  Laterality: N/A;   ESOPHAGOGASTRODUODENOSCOPY (EGD) WITH PROPOFOL  N/A 03/03/2017   Procedure: ESOPHAGOGASTRODUODENOSCOPY (EGD) WITH PROPOFOL ;  Surgeon: Lynwood Bohr, MD;  Location: [facility] ENDOSCOPY;  Service: Endoscopy;  Laterality: N/A;    ESOPHAGOGASTRODUODENOSCOPY (EGD) WITH PROPOFOL  N/A 09/04/2017   Procedure: ESOPHAGOGASTRODUODENOSCOPY (EGD) WITH PROPOFOL ;  Surgeon: Saintclair Jasper, MD;  Location: [facility] ENDOSCOPY;  Service: Gastroenterology;  Laterality: N/A;   ESOPHAGOGASTRODUODENOSCOPY (EGD) WITH PROPOFOL  N/A 11/27/2017   Procedure: ESOPHAGOGASTRODUODENOSCOPY (EGD);  Surgeon: Saintclair Jasper, MD;  Location: [facility] ENDOSCOPY;  Service: Gastroenterology;  Laterality: N/A;   ESOPHAGOGASTRODUODENOSCOPY (EGD) WITH PROPOFOL  N/A 08/16/2019   Procedure: ESOPHAGOGASTRODUODENOSCOPY (EGD) WITH PROPOFOL ;  Surgeon: Saintclair Jasper, MD;  [facility]: [facility];  Service: Gastroenterology;  Laterality: N/A;   ESOPHAGOGASTRODUODENOSCOPY (EGD) WITH PROPOFOL  N/A 08/21/2020   Procedure: ESOPHAGOGASTRODUODENOSCOPY (EGD) WITH PROPOFOL ;  Surgeon: Saintclair Jasper, MD;  [facility]: [facility];  Service: Gastroenterology;  Laterality: N/A;   ESOPHAGOGASTRODUODENOSCOPY (EGD) WITH PROPOFOL  N/A 08/24/2021   Procedure: ESOPHAGOGASTRODUODENOSCOPY (EGD) WITH PROPOFOL ;  Surgeon: Saintclair Jasper, MD;  Location: [facility];  Service: Gastroenterology;  Laterality: N/A;   ESOPHAGOGASTRODUODENOSCOPY (EGD) WITH PROPOFOL  N/A 06/25/2023   Procedure: ESOPHAGOGASTRODUODENOSCOPY (EGD) WITH PROPOFOL ;  Surgeon: Rosalie Kitchens, MD;  Location: [facility] ENDOS[facility];  Service: Gastroenterology;  Laterality: N/A;  W. bandging   ESOPHAGOGASTRODUODENOSCOPY (EGD) WITH PROPOFOL  N/A 10/21/2023   Procedure: ESOPHAGOGASTRODUODENOSCOPY (EGD) WITH PROPOFOL ;  Surgeon: Saintclair Jasper, MD;  Location: [facility];  Service: Gastroenterology;  Laterality: N/A;  with banding and APC   GASTRIC VARICES BANDING N/A 08/24/2021   Procedure: GASTRIC VARICES BANDING;  Surgeon: Saintclair Jasper, MD;  Location: [facility];  Service: Gastroenterology;  Laterality: N/A;   GASTRIC VARICES BANDING N/A 08/13/2022   Procedure: GASTRIC VARICES BANDING;  Surgeon: Saintclair Jasper, MD;  Location: [facility];  Service: Gastroenterology;  Laterality:  N/A;   HEMOSTASIS CLIP PLACEMENT  08/21/2020   Procedure: HEMOSTASIS CLIP PLACEMENT;  Surgeon: Saintclair Jasper, MD;  Location: [facility];  Service: Gastroenterology;;   HOT HEMOSTASIS N/A 10/21/2023   Procedure: HOT HEMOSTASIS (ARGON PLASMA COAGULATION/BICAP);  Surgeon: Saintclair Jasper, MD;  Location: [facility];  Service: Gastroenterology;  Laterality: N/A;   INTERCOSTAL NERVE BLOCK Left 07/01/2022   Procedure: INTERCOSTAL NERVE BLOCK;  Surgeon: Kerrin Elspeth BROCKS, MD;  Location: [facility] OR;  Service: Thoracic;  Laterality: Left;   IR GENERIC HISTORICAL  05/15/2016   IR RADIOLOGIST EVAL & MGMT 05/15/2016 Ami Bellman, DO [facility] INTERV RAD   IR GENERIC HISTORICAL  01/14/2017   IR US  GUIDE VASC ACCESS RIGHT 01/14/2017 Ozell Specking, MD [facility]   IR GENERIC HISTORICAL  01/14/2017   IR VENOGRAM HEPATIC W HEMODYNAMIC EVALUATION 01/14/2017 Ozell Specking, MD [facility]   IR RADIOLOGIST EVAL & MGMT  06/12/2017   IR RADIOLOGIST EVAL & MGMT  08/11/2018   IR RADIOLOGIST EVAL & MGMT  08/25/2019   IR RADIOLOGIST EVAL & MGMT  08/22/2020   IR RADIOLOGIST EVAL & MGMT  03/11/2022   MASTECTOMY Right 06/02/2012   right breast with lymph node removal   NODE DISSECTION Left 07/01/2022   Procedure: NODE DISSECTION;  Surgeon: Kerrin Elspeth BROCKS, MD;  Location: [facility] OR;  Service: Thoracic;  Laterality: Left;   PORT-A-CATH REMOVAL     PORTACATH PLACEMENT  2011   left side   RADIOFREQUENCY ABLATION  06/25/2023   Procedure: RADIO FREQUENCY ABLATION;  Surgeon: Rosalie Kitchens, MD;  Location: [facility];  Service: Gastroenterology;;   RADIOLOGY WITH ANESTHESIA  N/A 04/13/2016   Procedure: RADIOLOGY WITH ANESTHESIA;  Surgeon: Ami Bellman, DO;  Location: [facility] OR;  Service: Anesthesiology;  Laterality: N/A;   VIDEO BRONCHOSCOPY WITH ENDOBRONCHIAL NAVIGATION N/A 07/01/2022   Procedure: VIDEO BRONCHOSCOPY WITH ENDOBRONCHIAL NAVIGATION;  Surgeon: Kerrin Elspeth BROCKS, MD;  Location: [facility] OR;  Service: Thoracic;   Laterality: N/A;   XI ROBOTIC ASSISTED THORACOSCOPY- SEGMENTECTOMY Left 07/01/2022   Procedure: XI ROBOTIC ASSISTED THORACOSCOPY-LEFT LOWER LOBE BASILAR SEGMENTECTOMY;  Surgeon: Kerrin Elspeth BROCKS, MD;  Location: [facility] OR;  Service: Thoracic;  Laterality: Left;    FAMILY HISTORY Family History  Problem Relation Age of Onset   COPD Mother    AAA (abdominal aortic aneurysm) Mother    COPD Father    Cancer Neg Hx   The patient's father died at the age of 59 from heart disease. Patient's mother died at the age of 27 status post CABG. The patient had no brothers, 2 sisters. There is no history of breast or ovarian cancer in the family.   GYNECOLOGIC HISTORY: Updated May 2018 Menarche age 88, she is GX P0. She does not recall when she went through menopause. She never took hormone replacement.   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 Husband Chyrl is retired from [employer]. They have no children and no pets. They attend a local [address].    ADVANCED DIRECTIVES: Not in place   HEALTH MAINTENANCE: Social History   Tobacco Use   Smoking status: Never   Smokeless tobacco: Never  Vaping Use   Vaping status: Never Used  Substance Use Topics   Alcohol use: No   Drug use: No     Colonoscopy: Never  PAP: Remote  Bone density: 09/27/2013, osteoporosis with a T score of -2.8  Lipid panel: Not on file    No Known Allergies   Current Outpatient Medications  Medication Sig Dispense Refill   apixaban  (ELIQUIS ) 2.5 MG TABS tablet Take 1 tablet (2.5 mg total) by mouth 2 (two) times daily. 60 tablet 3   carvedilol  (COREG ) 3.125 MG tablet Take 1 tablet (3.125 mg total) by mouth 2 (two) times daily with a meal. 60 tablet 0   cyanocobalamin  (VITAMIN B12) 1000 MCG tablet Take 1,000 mcg by mouth daily after breakfast.     denosumab  (PROLIA ) 60 MG/ML SOLN injection Inject 60 mg  into the skin every 6 (six) months. Administer in upper arm, thigh, or abdomen     ferrous sulfate  325 (65 FE) MG tablet Take 1 tablet (325 mg total) by mouth daily before breakfast. (Patient taking differently: Take 325 mg by mouth daily after breakfast.) 90 tablet 0   fulvestrant  (FASLODEX ) 250 MG/5ML injection Inject 500 mg into the muscle every 28 (twenty-eight) days.     furosemide  (LASIX ) 40 MG tablet Take 40 mg by mouth in the morning.     Lactulose  20 GM/30ML SOLN Take 30 mLs (20 g total) by mouth 2 (two) times daily. (Patient not taking: Reported on 12/07/2024) 450 mL 0   Milk Thistle 1000 MG CAPS Take 1,000 mg by mouth daily.     palbociclib  (IBRANCE ) 75 MG tablet Take 1 tablet (75 mg total) by mouth daily. Take for 21 days on, 7 days off, repeat every 28 days. (Patient not taking: Reported on 12/07/2024) 21 tablet 3   pantoprazole  (PROTONIX ) 40 MG tablet Take 1 tablet (40 mg total) by mouth  2 (two) times daily before a meal. 60 tablet 0   spironolactone  (ALDACTONE ) 100 MG tablet Take 100 mg by mouth in the morning.     venlafaxine  XR (EFFEXOR -XR) 150 MG 24 hr capsule TAKE 1 CAPSULE BY MOUTH DAILY WITH BREAKFAST. 90 capsule 3   No current facility-administered medications for this visit.   Facility-Administered Medications Ordered in Other Visits  Medication Dose Route Frequency Provider Last Rate Last Admin   denosumab  (PROLIA ) injection 60 mg  60 mg Subcutaneous Once Magrinat, Sandria BROCKS, MD        OBJECTIVE: white woman who appears younger than stated age  Vitals:   12/14/24 1046  BP: 129/71  Pulse: 94  Resp: 15  Temp: 97.7 F (36.5 C)  TempSrc: Temporal  SpO2: 99%  Weight: 126 lb 9.6 oz (57.4 kg)      Physical Exam Constitutional:      Appearance: Normal appearance. She is not ill-appearing.     Comments:    Cardiovascular:     Rate and Rhythm: Normal rate and regular rhythm.     Pulses: Normal pulses.     Heart sounds: Normal heart sounds.  Pulmonary:      Effort: Pulmonary effort is normal.     Breath sounds: Normal breath sounds.  Abdominal:     General: There is distension.  Musculoskeletal:        General: No swelling or tenderness. Normal range of motion.     Cervical back: Normal range of motion. No rigidity.  Lymphadenopathy:     Cervical: No cervical adenopathy.  Skin:    General: Skin is warm and dry.  Neurological:     General: No focal deficit present.     Mental Status: She is alert.    LAB RESULTS:  CMP     Component Value Date/Time   NA 140 12/11/2024 0737   NA 142 11/17/2017 1355   K 3.3 (L) 12/11/2024 0737   K 4.4 11/17/2017 1355   CL 109 12/11/2024 0737   CL 109 (H) 04/20/2013 0859   CO2 21 (L) 12/11/2024 0737   CO2 22 11/17/2017 1355   GLUCOSE 99 12/11/2024 0737   GLUCOSE 86 11/17/2017 1355   GLUCOSE 91 04/20/2013 0859   BUN 9 12/11/2024 0737   BUN 12.5 11/17/2017 1355   CREATININE 0.92 12/11/2024 0737   CREATININE 1.05 (H) 12/07/2024 1232   CREATININE 1.1 11/17/2017 1355   CALCIUM 6.9 (L) 12/11/2024 0737   CALCIUM 9.2 11/17/2017 1355   PROT 6.2 (L) 12/07/2024 1232   PROT 7.0 11/17/2017 1355   ALBUMIN  3.5 12/07/2024 1232   ALBUMIN  3.8 11/17/2017 1355   AST 25 12/07/2024 1232   AST 23 11/17/2017 1355   ALT 19 12/07/2024 1232   ALT 22 11/17/2017 1355   ALKPHOS 66 12/07/2024 1232   ALKPHOS 55 11/17/2017 1355   BILITOT 0.7 12/07/2024 1232   BILITOT 0.49 11/17/2017 1355   GFRNONAA >60 12/11/2024 0737   GFRNONAA 57 (L) 12/07/2024 1232   GFRNONAA 53 (L) 05/09/2016 1104   GFRAA 53 (L) 06/12/2020 1146   GFRAA 55 (L) 12/13/2019 1200   GFRAA 61 05/09/2016 1104    Lab Results  Component Value Date   TOTALPROTELP 6.0 03/04/2024   ALBUMINELP 3.4 03/04/2024   A1GS 0.4 03/04/2024   A2GS 0.6 03/04/2024   BETS 1.1 03/04/2024   GAMS 0.6 03/04/2024   MSPIKE Not Observed 03/04/2024   SPEI Comment 03/04/2024    Lab Results  Component Value Date  WBC 6.3 12/14/2024   NEUTROABS 4.8 12/14/2024   HGB  11.4 (L) 12/14/2024   HCT 35.1 (L) 12/14/2024   MCV 98.6 12/14/2024   PLT 226 12/14/2024    Lab Results  Component Value Date   LABCA2 18 08/21/2012    No components found for: [id number]  Recent Labs  Lab 12/07/24 1614  INR 2.6*     Lab Results  Component Value Date   LABCA2 18 08/21/2012    No results found for: RJW800  No results found for: CAN125  No results found for: RJW846  Lab Results  Component Value Date   CA2729 26.9 12/13/2019    No components found for: HGQUANT  No results found for: CEA1, CEA / No results found for: CEA1, CEA   No results found for: AFPTUMOR  No results found for: CHROMOGRNA  No results found for: KPAFRELGTCHN, LAMBDASER, KAPLAMBRATIO (kappa/lambda light chains)  No results found for: HGBA, HGBA2QUANT, HGBFQUANT, HGBSQUAN (Hemoglobinopathy evaluation)   Lab Results  Component Value Date   LDH 242 (H) 11/03/2024    Lab Results  Component Value Date   IRON  55 12/14/2024   TIBC 372 12/14/2024   IRONPCTSAT 15 12/14/2024   (Iron  and TIBC)  Lab Results  Component Value Date   FERRITIN 44 12/07/2024    Urinalysis    Component Value Date/Time   COLORURINE YELLOW 06/28/2022 1122   APPEARANCEUR CLEAR 06/28/2022 1122   LABSPEC 1.015 06/28/2022 1122   LABSPEC 1.010 04/29/2016 1118   PHURINE 6.0 06/28/2022 1122   GLUCOSEU NEGATIVE 06/28/2022 1122   GLUCOSEU Negative 04/29/2016 1118   HGBUR NEGATIVE 06/28/2022 1122   BILIRUBINUR NEGATIVE 06/28/2022 1122   BILIRUBINUR Negative 04/29/2016 1118   KETONESUR NEGATIVE 06/28/2022 1122   PROTEINUR NEGATIVE 06/28/2022 1122   UROBILINOGEN 0.2 04/29/2016 1118   NITRITE NEGATIVE 06/28/2022 1122   LEUKOCYTESUR NEGATIVE 06/28/2022 1122   LEUKOCYTESUR Trace 04/29/2016 1118   CBC from today shows significant improvement in hemoglobin.  White blood cell count is 3100, total neutrophil count is 2500, no thrombocytopenia  STUDIES: US   Paracentesis Result Date: 12/08/2024 INDICATION: 68 year old female. History of metastatic breast cancer, MASH, cirrhosis with portal hypertension and esophageal varices with recurrent ascites. Request is for therapeutic paracentesis. 6 L max EXAM: ULTRASOUND GUIDED THERAPEUTIC PARACENTESIS MEDICATIONS: Lidocaine  1% 10 mL COMPLICATIONS: None immediate. PROCEDURE: Informed written consent was obtained from the patient after a discussion of the risks, benefits and alternatives to treatment. A timeout was performed prior to the initiation of the procedure. Initial ultrasound scanning demonstrates a small amount of ascites within the right lower abdominal quadrant. The right lower abdomen was prepped and draped in the usual sterile fashion. 1% lidocaine  was used for local anesthesia. Following this, a 19 gauge, 7-cm, Yueh catheter was introduced. An ultrasound image was saved for documentation purposes. The paracentesis was performed. The catheter was removed and a dressing was applied. The patient tolerated the procedure well without immediate post procedural complication. Patient received post-procedure intravenous albumin ; see nursing notes for details. FINDINGS: A total of approximately 2 L of straw-colored fluid was removed. IMPRESSION: Successful ultrasound-guided paracentesis yielding 2.0 L of peritoneal fluid. Performed by Delon Beagle NP PLAN: The patient has required >/=2 paracenteses in a 30 day period and a formal evaluation by the [facility] has been arranged. Thom Hall, MD [facility] Radiology Electronically Signed   By: Thom Hall M.D.   On: 12/08/2024 14:35   CT ABDOMEN  PELVIS W CONTRAST Result Date: 11/23/2024 EXAM: CT ABDOMEN AND PELVIS WITH CONTRAST 11/18/2024 11:52:44 AM TECHNIQUE: CT of the abdomen and pelvis was performed with the administration of 80 mL of iopamidol  (ISOVUE -370)  76% injection. Multiplanar reformatted images are provided for review. Automated exposure control, iterative reconstruction, and/or weight-based adjustment of the mA/kV was utilized to reduce the radiation dose to as low as reasonably achievable. COMPARISON: PET CT 09/16/2024, limited ultrasound 05/30/2023. CLINICAL HISTORY: UNINTENDED WEIGHT LOSS , HEPATIC CIRRHOSIS FINDINGS: LOWER CHEST: Small bilateral pleural effusions. Subpleural reticulation and scarring on the right consistent with posttherapeutic change. Postoperative changes of the left lower lobe. Post mastectomy changes on the right. LIVER: Hepatic cirrhosis. GALLBLADDER AND BILE DUCTS: Gallbladder is unremarkable. No biliary ductal dilatation. SPLEEN: Splenomegaly, seen measures 13 cm. PANCREAS: No acute abnormality. ADRENAL GLANDS: No acute abnormality. KIDNEYS, URETERS AND BLADDER: No stones in the kidneys or ureters. No hydronephrosis. No perinephric or periureteral stranding. Urinary bladder is unremarkable. GI AND BOWEL: Distal esophageal varices and small hiatal hernia. Embolization coils with artifact near the gastric cardia. Large quantity of stool in the colon. There is no bowel obstruction. PERITONEUM AND RETROPERITONEUM: Moderate volume of abdominal pelvic ascites. No free air. No overt peritoneal nodularity. VASCULATURE: Recanalized periumbilical vein. Mild aortic atherosclerosis. Aorta is normal in caliber. LYMPH NODES: No lymphadenopathy. REPRODUCTIVE ORGANS: No acute abnormality. BONES AND SOFT TISSUES: Chronic L1 compression fracture. Probable chronic L2 compression fracture with sclerosis. Multilevel degenerative changes. Post mastectomy changes on the right. IMPRESSION: 1. Hepatic cirrhosis with associated findings of portal hypertension. Moderate volume abdominal pelvic ascites. 2. Postmastectomy changes on the right. 3. Small bilateral pleural effusions Electronically signed by: Luke Bun MD 11/23/2024 10:46 PM EST RP Workstation:  [id number]      COMPARISON: Abdominal ultrasound November 05, 2021   TECHNIQUE:  Real time limited abdomen ultrasound was performed.   IMAGE QUALITY: Satisfactory, of diagnostic value.   FINDINGS:   Limited sonographic views of the abdomen demonstrates mild abdominal ascites. There is no large pocket to safely drain.    IMPRESSION:  IMPRESSION:  1. Mild abdominal ascites.  2. No large enough pocket to safely drain.    ASSESSMENT: 68 y.o. [place] woman presenting June 2011 with stage IV breast cancer involving the right breast upper outer quadrant, right axilla, mediastinal lymph nodes, and both lungs, but not the brain, liver, or bones  (1) positive right breast and right axillary lymph node biopsies 07/17/2010 of a clinical T4 N2 M1 invasive ductal carcinoma, grade 1, estrogen receptor 100% positive, progesterone receptor 6% positive, with an MIB-1 of 12%, and no HER-2 amplification.  (2) participated in Phase II tessetaxel study, receiving 2 cycles complicated by thrombocytopenia, anemia requiring transfusion, transaminase elevation, and afebrile neutropenia. Off-study as of September 2011. Chest CT scan September 2011 did show evidence of response.  (3) on letrozole  as of October 2011, with continuing response and good tolerance   (4) 06/02/2012 underwent right mastectomy and axillary lymph node sampling (3 lymph nodes removed, all with viable cancer as well as evidence of treatment effect) for a ypT2 ypN1-2 invasive ductal carcinoma, grade 1, 98% estrogen receptor positive, progesterone receptor negative, with no HER-2 amplification  (5) left jugular vein DVT documented 06/16/2012; left sided Port-A-Cath removed mid July 2015; Coumadin  stopped 09/07/2014.  (a) Left brachiocephalic v. slightly narrowed, with some Left lower neck collateralization noted on chest CT December 2015  (6) osteoporosis, was on alendronate , switched to denosumab /Prolia  11/07/2016  (a) bone density in  10/2018 shows  osteopenia T score of -2.2  (7) portal hypertension, with splenomegaly, gastric varices, and intestinal edema  (a)  mild persistent leukopenia and thrombocytopenia likely due to splenomegaly  (b) iron  deficiency secondary to above, status post Feraheme  x2 December 2018  (8) restaging studies:  (a) chest CT 12/26/2017 shows a 4.5 mm left lower lobe pulmonary nodule which is unchanged.  There was no other evidence of active disease  (b) restaging chest CT in 05/2019 shows no change in bilateral pulmonary nodules, prominent mediastinal lymph nodes are stable  (9) CT scan of the abdomen 11/03/2021 shows thrombus in the portal, splenic and superior mesenteric vein  (A) Heparin  started 11/03/2021  10. PET scan 05/24/2022, low metabolic activity of right lower pulmonary nodule. No change in size of CT 02/27/2022, Carcinoma with mucinous features consistent with metastatic breast carcinoma, negative margins, no LN involvement.  Prognostics showed ER +100% strong staining, PR +20% staining, Ki-67 of 10% and HER2 2+ by IHC and negative by FISH  PLAN:  Assessment & Plan  Assessment & Plan  Metastatic breast cancer with no active disease. - Continue Ibrance , Faslodex , and Prolia . Prolia  on hold today, hypocalcemia noted. No concern for active disease Repeat imaging in March 2026.  Decompensated cirrhosis of the liver with ascites and portal hypertension Advanced decompensated cirrhosis with recurrent ascites and portal hypertension. Liver transplantation contraindicated due to breast cancer history. Symptoms related to hepatic dysfunction. - Referred to hepatology for evaluation and management, including procedural options. - Advised to notify office for paracentesis if abdominal distension becomes uncomfortable. - Continued spironolactone  for ascites management. - Continued lactulose  as needed. - Advised against over-the-counter liver supplements. - Will review liver enzyme and  function tests when available.  Chronic portal vein thrombosis on anticoagulation Chronic portal vein thrombosis managed with Eliquis . Dose reduced due to recurrent bleeding and anemia. - Reduced Eliquis  dose to 2.5 mg twice daily and sent new prescription. - Discussed that Eliquis  should not be taken once daily. - Will monitor for further bleeding and anemia.  Depressive disorder on pharmacotherapy Treated with venlafaxine  for depressive disorder. Reports persistent symptoms and possible plateau in efficacy. Transition to sertraline under consideration. - Advised to notify office if she wishes to proceed with medication change. - She is hesitant to change the medicine.  Anemia under evaluation Anemia likely multifactorial due to chronic disease, recurrent bleeding, and possible iron  deficiency. Hemoglobin is 11.4 g/dL. - Will review iron  studies when available. - Will arrange iron  infusion if indicated. - Will follow up next month or sooner if anemia worsens.  Total time: 40 min  *Total Encounter Time as defined by the Centers for Medicare and Medicaid Services includes, in addition to the face-to-face time of a patient visit (documented in the note above) non-face-to-face time: obtaining and reviewing outside history, ordering and reviewing medications, tests or procedures, care coordination (communications with other health care professionals or caregivers) and documentation in the medical record.

## 2024-12-14 NOTE — Progress Notes (Signed)
 Specialty Pharmacy Refill Coordination Note  Nicole [name] is a 68 y.o. female contacted today regarding refills of specialty medication(s) Palbociclib  (IBRANCE )   Patient requested (Patient-Rptd) Pickup at [facility] at [place] date: 12/14/24   Medication will be filled on: 12/14/24

## 2024-12-14 NOTE — Progress Notes (Signed)
 Hold Prolia  today d/t hypocalcemia=6.9 on 12/13 with no corresponding albumin  drawn to determine corrected calcium per Dr. Loretha.  Kynadee Dam, PharmD, MBA

## 2024-12-20 ENCOUNTER — Other Ambulatory Visit (HOSPITAL_COMMUNITY): Payer: Self-pay

## 2025-01-10 ENCOUNTER — Other Ambulatory Visit: Payer: Self-pay

## 2025-01-10 ENCOUNTER — Other Ambulatory Visit (HOSPITAL_COMMUNITY): Payer: Self-pay

## 2025-01-10 ENCOUNTER — Other Ambulatory Visit: Payer: Self-pay | Admitting: *Deleted

## 2025-01-10 ENCOUNTER — Encounter (INDEPENDENT_AMBULATORY_CARE_PROVIDER_SITE_OTHER): Payer: Self-pay

## 2025-01-10 ENCOUNTER — Other Ambulatory Visit: Payer: Self-pay | Admitting: Gastroenterology

## 2025-01-10 DIAGNOSIS — E876 Hypokalemia: Secondary | ICD-10-CM

## 2025-01-10 DIAGNOSIS — C50411 Malignant neoplasm of upper-outer quadrant of right female breast: Secondary | ICD-10-CM

## 2025-01-10 DIAGNOSIS — D500 Iron deficiency anemia secondary to blood loss (chronic): Secondary | ICD-10-CM

## 2025-01-11 ENCOUNTER — Telehealth: Payer: Self-pay

## 2025-01-11 ENCOUNTER — Inpatient Hospital Stay: Admitting: Hematology and Oncology

## 2025-01-11 ENCOUNTER — Other Ambulatory Visit (HOSPITAL_COMMUNITY): Payer: Self-pay

## 2025-01-11 ENCOUNTER — Other Ambulatory Visit: Payer: Self-pay

## 2025-01-11 ENCOUNTER — Inpatient Hospital Stay: Attending: Hematology and Oncology

## 2025-01-11 VITALS — BP 100/65 | HR 94 | Temp 96.3°F | Resp 16 | Wt 113.6 lb

## 2025-01-11 DIAGNOSIS — M810 Age-related osteoporosis without current pathological fracture: Secondary | ICD-10-CM

## 2025-01-11 DIAGNOSIS — K7460 Unspecified cirrhosis of liver: Secondary | ICD-10-CM | POA: Insufficient documentation

## 2025-01-11 DIAGNOSIS — D649 Anemia, unspecified: Secondary | ICD-10-CM | POA: Insufficient documentation

## 2025-01-11 DIAGNOSIS — R161 Splenomegaly, not elsewhere classified: Secondary | ICD-10-CM | POA: Diagnosis not present

## 2025-01-11 DIAGNOSIS — Z1722 Progesterone receptor negative status: Secondary | ICD-10-CM | POA: Diagnosis not present

## 2025-01-11 DIAGNOSIS — Z5111 Encounter for antineoplastic chemotherapy: Secondary | ICD-10-CM | POA: Diagnosis present

## 2025-01-11 DIAGNOSIS — C50411 Malignant neoplasm of upper-outer quadrant of right female breast: Secondary | ICD-10-CM

## 2025-01-11 DIAGNOSIS — Z86718 Personal history of other venous thrombosis and embolism: Secondary | ICD-10-CM | POA: Insufficient documentation

## 2025-01-11 DIAGNOSIS — Z853 Personal history of malignant neoplasm of breast: Secondary | ICD-10-CM

## 2025-01-11 DIAGNOSIS — Z9011 Acquired absence of right breast and nipple: Secondary | ICD-10-CM

## 2025-01-11 DIAGNOSIS — K766 Portal hypertension: Secondary | ICD-10-CM

## 2025-01-11 DIAGNOSIS — Z170 Estrogen receptor positive status [ER+]: Secondary | ICD-10-CM | POA: Diagnosis not present

## 2025-01-11 LAB — CBC WITH DIFFERENTIAL (CANCER CENTER ONLY)
Abs Immature Granulocytes: 0.01 K/uL (ref 0.00–0.07)
Basophils Absolute: 0 K/uL (ref 0.0–0.1)
Basophils Relative: 1 %
Eosinophils Absolute: 0.1 K/uL (ref 0.0–0.5)
Eosinophils Relative: 2 %
HCT: 22.6 % — ABNORMAL LOW (ref 36.0–46.0)
Hemoglobin: 7.2 g/dL — ABNORMAL LOW (ref 12.0–15.0)
Immature Granulocytes: 0 %
Lymphocytes Relative: 13 %
Lymphs Abs: 0.6 K/uL — ABNORMAL LOW (ref 0.7–4.0)
MCH: 40.4 pg — ABNORMAL HIGH (ref 26.0–34.0)
MCHC: 31.9 g/dL (ref 30.0–36.0)
MCV: 127 fL — ABNORMAL HIGH (ref 80.0–100.0)
Monocytes Absolute: 0.7 K/uL (ref 0.1–1.0)
Monocytes Relative: 15 %
Neutro Abs: 3.2 K/uL (ref 1.7–7.7)
Neutrophils Relative %: 69 %
Platelet Count: 144 K/uL — ABNORMAL LOW (ref 150–400)
RBC: 1.78 MIL/uL — ABNORMAL LOW (ref 3.87–5.11)
RDW: 26.6 % — ABNORMAL HIGH (ref 11.5–15.5)
Smear Review: NORMAL
WBC Count: 4.6 K/uL (ref 4.0–10.5)
nRBC: 0.7 % — ABNORMAL HIGH (ref 0.0–0.2)

## 2025-01-11 LAB — CMP (CANCER CENTER ONLY)
ALT: 15 U/L (ref 0–44)
AST: 22 U/L (ref 15–41)
Albumin: 4.1 g/dL (ref 3.5–5.0)
Alkaline Phosphatase: 61 U/L (ref 38–126)
Anion gap: 17 — ABNORMAL HIGH (ref 5–15)
BUN: 18 mg/dL (ref 8–23)
CO2: 17 mmol/L — ABNORMAL LOW (ref 22–32)
Calcium: 9.2 mg/dL (ref 8.9–10.3)
Chloride: 102 mmol/L (ref 98–111)
Creatinine: 1.33 mg/dL — ABNORMAL HIGH (ref 0.44–1.00)
GFR, Estimated: 43 mL/min — ABNORMAL LOW
Glucose, Bld: 119 mg/dL — ABNORMAL HIGH (ref 70–99)
Potassium: 4.9 mmol/L (ref 3.5–5.1)
Sodium: 135 mmol/L (ref 135–145)
Total Bilirubin: 0.9 mg/dL (ref 0.0–1.2)
Total Protein: 6.7 g/dL (ref 6.5–8.1)

## 2025-01-11 LAB — VITAMIN B12: Vitamin B-12: 1365 pg/mL — ABNORMAL HIGH (ref 180–914)

## 2025-01-11 LAB — SAMPLE TO BLOOD BANK

## 2025-01-11 MED ORDER — CARVEDILOL 3.125 MG PO TABS: 3.1250 mg | ORAL_TABLET | Freq: Two times a day (BID) | ORAL | 0 refills | Status: AC

## 2025-01-11 NOTE — Telephone Encounter (Signed)
 Called pt to offer appt for blood transfusion Friday 1/16 per charge RN. She is agreeable to come in at 67. She will have her injections during this visit. Orders placed per MD.

## 2025-01-11 NOTE — Progress Notes (Signed)
 Orders placed per MD for 2U PRBC to transfuse 01/14/25 d/t hgb 7.2. Type and screen and prepare orders released. Confirmed with BB.

## 2025-01-11 NOTE — Progress Notes (Signed)
 Specialty Pharmacy Refill Coordination Note  MyChart Questionnaire Submission  Nicole [name] is a 69 y.o. female contacted today regarding refills of specialty medication(s) Ibrance .  Doses on hand: (Patient-Rptd) 19   Patient requested: (Patient-Rptd) Pickup at [facility] at [facility] date: 01/14/25  Medication will be filled on 01/13/25

## 2025-01-11 NOTE — Progress Notes (Signed)
 " [facility]  [phone] Fax:([phone]    ID: Nicole [name]   DOB: 01/10/1956  MR#: [id number]  RDW#:[id number]  Patient Care Team: [facility] [place] as PCP - General (Family Medicine) Hays Morrison, CRNP as Nurse Practitioner (Nurse Practitioner) Ebbie Cough, MD as Consulting Physician (General Surgery) Saintclair Jasper, MD as Consulting Physician (Gastroenterology) Loretha Ash, MD as Consulting Physician (Hematology and Oncology)  CHIEF COMPLAINT:  Metastatic Breast Cancer (s/p right mastectomy)  CURRENT TREATMENT: Ibrance  and Faslodex , prolia  every 6 months.  INTERVAL HISTORY:  Discussed the use of AI scribe software for clinical note transcription with the patient, who gave verbal consent to proceed. History of Present Illness  Nicole [name] is a 69 year old female with metastatic breast cancer, cirrhosis, and transfusion-dependent anemia who presents with worsening fatigue and symptomatic anemia.  She reports acute worsening of fatigue, generalized weakness, and exertional intolerance over the past several days. Hemoglobin has decreased to 7.2 g/dL from a previous value of 11 g/dL, with prior nadirs as low as 4 g/dL. She describes her current symptoms as more severe than previous episodes, stating she feels miserable and is unable to tolerate minimal activity, requiring prolonged recovery after climbing stairs or showering. She experiences persistent exhaustion following exertion.  She notes intermittent numbness of the tongue, which worsens with exertion and occasionally impairs speech. She also endorses persistent pain and discomfort in her legs and hips.  She denies melena, hematochezia, or overt gastrointestinal bleeding. She has not yet been evaluated by hepatology, though a referral was placed in December.  She restarted Ibrance  yesterday after holding it for one week and is currently on the lowest dose.  Faslodex  injections are ongoing. She takes B12 daily and requires a refill of carvedilol  for portal hypertension.    COVID 19 VACCINATION STATUS: Status post Pfizer x2+ booster August 2021   BREAST CANCER HISTORY: From Dr. Maude Rubin's 07/25/2010 note:  This woman has not had any significant medical intervention for some time.  Her last mammogram was about seven years ago.  She noted a right breast mass about a year ago and did not seek immediate medical attention for this.  She ultimately, after some time, admitted this to her husband and self-referred herself for intervention.  She had a mammogram on 07/17/2010 with an ultrasound of the right breast.  This showed a lobulated mass upper right outer quadrant measuring at least 6.3 x 7.3 cm and large right axillary lymph nodes were also noted.  There was skin thickening overlying the mass.  On physical exam, this mass was about an 8 cm with skin dimpling noted. Discolored area in the skin over the mass, fullness in the axilla.  Biopsy was recommended, which took place on 07/17/2010.  Pathology showed invasive ductal cancer involving both lymph node and breast.  The HER-2 was not amplified.  ER and PR both positive at 100% and 6% respectively.  Proliferative index was 12% involving both the lymph node and breast mass.  An MRI scan of both breasts was performed on 07/22/2010, essentially which showed a large heterogeneously enhancing mass of the right breast with washout kinetics measuring 6.4 x 4.4 x 5.0 cm.  Numerous satellite nodules were seen throughout the dominant mass.  There were several suspicious nodules in the upper central and upper inner quadrant of the right breast.  There were two enhancing subcentimeter nodules seen in the right pectoralis muscle.  No obvious chest wall nodules were seen; however,  there was seen some metastatic adenopathy in the mediastinum as well as hila with a 2.4 x 2.0 cm mediastinal lymph node as well as 1.2 x 3.0 cm  subcarinal lymph node.  There was a right hilar and probable left hilar adenopathy.  Bilateral pulmonary nodules were seen and a right T2 lesion was seen anterior right liver as well.  Her subsequent history is as detailed below.   PAST MEDICAL HISTORY: Past Medical History:  Diagnosis Date   Anemia    low iron    Arthritis    fingers   Blood transfusion 2012   Breast CA (HCC)    breast ca dx 06/2010, stage 4, right , Lung Metasis.Surgery, Chemo, Radiation   Breast cancer (HCC)    Clot    jugular  ?2015   Depression    Esophageal varices (HCC)    Headache    Mirgraine- rare since menopause   Heart murmur    mild, no cardiologist, from birth   Hematemesis 08/2017   History of kidney stones    no pain- shows in CT- small 1 mm bilaterally- kidney   Hypertension    IBD (inflammatory bowel disease)    resolved   Idiopathic cirrhosis (HCC)    pt denies   Peripheral vascular disease few yrs ago   clot in right jugular   Portal hypertension (HCC)    Radiation 07/21/12-09/07/12   5940 cGy   Restless legs     PAST SURGICAL HISTORY: Past Surgical History:  Procedure Laterality Date   BIOPSY  08/21/2020   Procedure: BIOPSY;  Surgeon: Saintclair Jasper, MD;  Location: [facility] END[facility]COPY;  Service: Gastroenterology;;   COLONOSCOPY  2016   DILATION AND CURETTAGE OF UTERUS  2004?   ESOPHAGEAL BANDING  09/04/2017   Procedure: ESOPHAGEAL BANDING;  Surgeon: Saintclair Jasper, MD;  Location: [facility] ENDOSCOPY;  Service: Gastroenterology;;   ESOPHAGEAL BANDING  11/27/2017   Procedure: ESOPHAGEAL BANDING;  Surgeon: Saintclair Jasper, MD;  Location: [facility] ENDOSCOPY;  Service: Gastroenterology;;   ESOPHAGEAL BANDING N/A 04/28/2018   Procedure: ESOPHAGEAL BANDING;  Surgeon: Saintclair Jasper, MD;  Location: [facility];  Service: Gastroenterology;  Laterality: N/A;   ESOPHAGEAL BANDING  11/17/2018   Procedure: ESOPHAGEAL BANDING;  Surgeon: Saintclair Jasper, MD;  Location: [facility];  Service: Gastroenterology;;   ESOPHAGEAL  BANDING  08/16/2019   Procedure: ESOPHAGEAL BANDING;  Surgeon: Saintclair Jasper, MD;  Location: [facility];  Service: Gastroenterology;;   ESOPHAGEAL BANDING N/A 08/21/2020   Procedure: ESOPHAGEAL BANDING;  Surgeon: Saintclair Jasper, MD;  Location: [facility];  Service: Gastroenterology;  Laterality: N/A;   ESOPHAGEAL BANDING  10/21/2023   Procedure: ESOPHAGEAL BANDING;  Surgeon: Saintclair Jasper, MD;  Location: [facility];  Service: Gastroenterology;;   ESOPHAGOGASTRODUODENOSCOPY N/A 04/28/2018   Procedure: ESOPHAGOGASTRODUODENOSCOPY (EGD);  Surgeon: Saintclair Jasper, MD;  Location: [facility];  Service: Gastroenterology;  Laterality: N/A;   ESOPHAGOGASTRODUODENOSCOPY N/A 11/17/2018   Procedure: ESOPHAGOGASTRODUODENOSCOPY (EGD);  Surgeon: Saintclair Jasper, MD;  Location: [facility];  Service: Gastroenterology;  Laterality: N/A;   ESOPHAGOGASTRODUODENOSCOPY N/A 08/13/2022   Procedure: ESOPHAGOGASTRODUODENOSCOPY (EGD);  Surgeon: Saintclair Jasper, MD;  Location: [facility];  Service: Gastroenterology;  Laterality: N/A;   ESOPHAGOGASTRODUODENOSCOPY N/A 12/09/2024   Procedure: EGD (ESOPHAGOGASTRODUODENOSCOPY);  Surgeon: Elicia Claw, MD;  Location: [facility];  Service: Gastroenterology;  Laterality: N/A;   ESOPHAGOGASTRODUODENOSCOPY (EGD) WITH PROPOFOL  N/A 04/11/2016   Procedure: ESOPHAGOGASTRODUODENOSCOPY (EGD) WITH PROPOFOL ;  Surgeon: Oliva Boots, MD;  Location: [facility];  Service: Endoscopy;  Laterality: N/A;   ESOPHAGOGASTRODUODENOSCOPY (EGD) WITH PROPOFOL   N/A 07/08/2016   Procedure: ESOPHAGOGASTRODUODENOSCOPY (EGD) WITH PROPOFOL ;  Surgeon: Lynwood Bohr, MD;  Location[facility]COPY;  Service: Endoscopy;  Laterality: N/A;   ESOPHAGOGASTRODUODENOSCOPY (EGD) WITH PROPOFOL  N/A 03/03/2017   Procedure: ESOPHAGOGASTRODUODENOSCOPY (EGD) WITH PROPOFOL ;  Surgeon: Lynwood Bohr, MD;  Location: [facility] ENDOSCOPY;  Service: Endoscopy;  Laterality: N/A;   ESOPHAGOGASTRODUODENOSCOPY (EGD) WITH PROPOFOL  N/A 09/04/2017    Procedure: ESOPHAGOGASTRODUODENOSCOPY (EGD) WITH PROPOFOL ;  Surgeon: Saintclair Jasper, MD;  Location: [facility] ENDOSCOPY;  Service: Gastroenterology;  Laterality: N/A;   ESOPHAGOGASTRODUODENOSCOPY (EGD) WITH PROPOFOL  N/A 11/27/2017   Procedure: ESOPHAGOGASTRODUODENOSCOPY (EGD);  Surgeon: Saintclair Jasper, MD;  Location: [facility] ENDOSCOPY;  Service: Gastroenterology;  Laterality: N/A;   ESOPHAGOGASTRODUODENOSCOPY (EGD) WITH PROPOFOL  N/A 08/16/2019   Procedure: ESOPHAGOGASTRODUODENOSCOPY (EGD) WITH PROPOFOL ;  Surgeon: Saintclair Jasper, MD;  [facility]: [facility];  Service: Gastroenterology;  Laterality: N/A;   ESOPHAGOGASTRODUODENOSCOPY (EGD) WITH PROPOFOL  N/A 08/21/2020   Procedure: ESOPHAGOGASTRODUODENOSCOPY (EGD) WITH PROPOFOL ;  Surgeon: Saintclair Jasper, MD;  [facility]: [facility];  Service: Gastroenterology;  Laterality: N/A;   ESOPHAGOGASTRODUODENOSCOPY (EGD) WITH PROPOFOL  N/A 08/24/2021   Procedure: ESOPHAGOGASTRODUODENOSCOPY (EGD) WITH PROPOFOL ;  Surgeon: Saintclair Jasper, MD;  Location: [facility];  Service: Gastroenterology;  Laterality: N/A;   ESOPHAGOGASTRODUODENOSCOPY (EGD) WITH PROPOFOL  N/A 06/25/2023   Procedure: ESOPHAGOGASTRODUODENOSCOPY (EGD) WITH PROPOFOL ;  Surgeon: Rosalie Kitchens, MD;  Location: [facility];  Service: Gastroenterology;  Laterality: N/A;  W. bandging   ESOPHAGOGASTRODUODENOSCOPY (EGD) WITH PROPOFOL  N/A 10/21/2023   Procedure: ESOPHAGOGASTRODUODENOSCOPY (EGD) WITH PROPOFOL ;  Surgeon: Saintclair Jasper, MD;  Location: [facility];  Service: Gastroenterology;  Laterality: N/A;  with banding and APC   GASTRIC VARICES BANDING N/A 08/24/2021   Procedure: GASTRIC VARICES BANDING;  Surgeon: Saintclair Jasper, MD;  Location: [facility];  Service: Gastroenterology;  Laterality: N/A;   GASTRIC VARICES BANDING N/A 08/13/2022   Procedure: GASTRIC VARICES BANDING;  Surgeon: Saintclair Jasper, MD;  Location: [facility];  Service: Gastroenterology;  Laterality: N/A;   HEMOSTASIS CLIP PLACEMENT  08/21/2020   Procedure:  HEMOSTASIS CLIP PLACEMENT;  Surgeon: Saintclair Jasper, MD;  Location: [facility];  Service: Gastroenterology;;   HOT HEMOSTASIS N/A 10/21/2023   Procedure: HOT HEMOSTASIS (ARGON PLASMA COAGULATION/BICAP);  Surgeon: Saintclair Jasper, MD;  Location: [facility];  Service: Gastroenterology;  Laterality: N/A;   INTERCOSTAL NERVE BLOCK Left 07/01/2022   Procedure: INTERCOSTAL NERVE BLOCK;  Surgeon: Kerrin Elspeth BROCKS, MD;  Location: [facility] OR;  Service: Thoracic;  Laterality: Left;   IR GENERIC HISTORICAL  05/15/2016   IR RADIOLOGIST EVAL & MGMT 05/15/2016 Ami Bellman, DO [facility] INTERV RAD   IR GENERIC HISTORICAL  01/14/2017   IR US  GUIDE VASC ACCESS RIGHT 01/14/2017 Ozell Specking, MD [facility]   IR GENERIC HISTORICAL  01/14/2017   IR VENOGRAM HEPATIC W HEMODYNAMIC EVALUATION 01/14/2017 Ozell Specking, MD [facility]   IR RADIOLOGIST EVAL & MGMT  06/12/2017   IR RADIOLOGIST EVAL & MGMT  08/11/2018   IR RADIOLOGIST EVAL & MGMT  08/25/2019   IR RADIOLOGIST EVAL & MGMT  08/22/2020   IR RADIOLOGIST EVAL & MGMT  03/11/2022   MASTECTOMY Right 06/02/2012   right breast with lymph node removal   NODE DISSECTION Left 07/01/2022   Procedure: NODE DISSECTION;  Surgeon: Kerrin Elspeth BROCKS, MD;  Location: [facility] OR;  Service: Thoracic;  Laterality: Left;   PORT-A-CATH REMOVAL     PORTACATH PLACEMENT  2011   left side   RADIOFREQUENCY ABLATION  06/25/2023   Procedure: RADIO FREQUENCY ABLATION;  Surgeon: Rosalie Kitchens, MD;  Location: [facility];  Service: Gastroenterology;;  RADIOLOGY WITH ANESTHESIA N/A 04/13/2016   Procedure: RADIOLOGY WITH ANESTHESIA;  Surgeon: Ami Bellman, DO;  Location: [facility] OR;  Service: Anesthesiology;  Laterality: N/A;   VIDEO BRONCHOSCOPY WITH ENDOBRONCHIAL NAVIGATION N/A 07/01/2022   Procedure: VIDEO BRONCHOSCOPY WITH ENDOBRONCHIAL NAVIGATION;  Surgeon: Kerrin Elspeth BROCKS, MD;  Location: [facility] OR;  Service: Thoracic;  Laterality: N/A;   XI ROBOTIC ASSISTED THORACOSCOPY- SEGMENTECTOMY  Left 07/01/2022   Procedure: XI ROBOTIC ASSISTED THORACOSCOPY-LEFT LOWER LOBE BASILAR SEGMENTECTOMY;  Surgeon: Kerrin Elspeth BROCKS, MD;  Location: [facility] OR;  Service: Thoracic;  Laterality: Left;    FAMILY HISTORY Family History  Problem Relation Age of Onset   COPD Mother    AAA (abdominal aortic aneurysm) Mother    COPD Father    Cancer Neg Hx   The patient's father died at the age of 27 from heart disease. Patient's mother died at the age of 32 status post CABG. The patient had no brothers, 2 sisters. There is no history of breast or ovarian cancer in the family.   GYNECOLOGIC HISTORY: Updated May 2018 Menarche age 24, she is GX P0. She does not recall when she went through menopause. She never took hormone replacement.   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 Husband Chyrl is retired from [employer]. They have no children and no pets. They attend a local [address].    ADVANCED DIRECTIVES: Not in place   HEALTH MAINTENANCE: Social History   Tobacco Use   Smoking status: Never   Smokeless tobacco: Never  Vaping Use   Vaping status: Never Used  Substance Use Topics   Alcohol use: No   Drug use: No     Colonoscopy: Never  PAP: Remote  Bone density: 09/27/2013, osteoporosis with a T score of -2.8  Lipid panel: Not on file    No Known Allergies   Current Outpatient Medications  Medication Sig Dispense Refill   apixaban  (ELIQUIS ) 2.5 MG TABS tablet Take 1 tablet (2.5 mg total) by mouth 2 (two) times daily. 60 tablet 3   carvedilol  (COREG ) 3.125 MG tablet Take 1 tablet (3.125 mg total) by mouth 2 (two) times daily with a meal. 60 tablet 0   cyanocobalamin  (VITAMIN B12) 1000 MCG tablet Take 1,000 mcg by mouth daily after breakfast.     denosumab  (PROLIA ) 60 MG/ML SOLN injection Inject 60 mg into the skin every 6 (six) months. Administer in upper arm, thigh,  or abdomen     ferrous sulfate  325 (65 FE) MG tablet Take 1 tablet (325 mg total) by mouth daily before breakfast. (Patient taking differently: Take 325 mg by mouth daily after breakfast.) 90 tablet 0   fulvestrant  (FASLODEX ) 250 MG/5ML injection Inject 500 mg into the muscle every 28 (twenty-eight) days.     furosemide  (LASIX ) 40 MG tablet Take 40 mg by mouth in the morning.     Lactulose  20 GM/30ML SOLN Take 30 mLs (20 g total) by mouth 2 (two) times daily. (Patient not taking: Reported on 12/07/2024) 450 mL 0   Milk Thistle 1000 MG CAPS Take 1,000 mg by mouth daily.     palbociclib  (IBRANCE ) 75 MG tablet Take 1 tablet (75 mg total) by mouth daily. Take for 21 days on, 7 days off, repeat every 28 days. (Patient not taking: Reported on 12/07/2024) 21 tablet 3   pantoprazole  (PROTONIX ) 40 MG tablet Take 1 tablet (40 mg  total) by mouth 2 (two) times daily before a meal. 60 tablet 0   spironolactone  (ALDACTONE ) 100 MG tablet Take 100 mg by mouth in the morning.     venlafaxine  XR (EFFEXOR -XR) 150 MG 24 hr capsule TAKE 1 CAPSULE BY MOUTH DAILY WITH BREAKFAST. 90 capsule 3   No current facility-administered medications for this visit.   Facility-Administered Medications Ordered in Other Visits  Medication Dose Route Frequency Provider Last Rate Last Admin   denosumab  (PROLIA ) injection 60 mg  60 mg Subcutaneous Once Magrinat, Sandria BROCKS, MD        OBJECTIVE: white woman who appears younger than stated age  Vitals:   01/11/25 1027  BP: 100/65  Pulse: 94  Resp: 16  Temp: (!) 96.3 F (35.7 C)  TempSrc: Temporal  SpO2: 99%  Weight: 113 lb 9.6 oz (51.5 kg)     She appears tired, no acute distress No cervical adenopathy CTA bilaterally RRR No LE edema.  LAB RESULTS:  CMP     Component Value Date/Time   NA 140 12/11/2024 0737   NA 142 11/17/2017 1355   K 3.3 (L) 12/11/2024 0737   K 4.4 11/17/2017 1355   CL 109 12/11/2024 0737   CL 109 (H) 04/20/2013 0859   CO2 21 (L) 12/11/2024  0737   CO2 22 11/17/2017 1355   GLUCOSE 99 12/11/2024 0737   GLUCOSE 86 11/17/2017 1355   GLUCOSE 91 04/20/2013 0859   BUN 9 12/11/2024 0737   BUN 12.5 11/17/2017 1355   CREATININE 0.92 12/11/2024 0737   CREATININE 1.05 (H) 12/07/2024 1232   CREATININE 1.1 11/17/2017 1355   CALCIUM 6.9 (L) 12/11/2024 0737   CALCIUM 9.2 11/17/2017 1355   PROT 6.2 (L) 12/07/2024 1232   PROT 7.0 11/17/2017 1355   ALBUMIN  3.5 12/07/2024 1232   ALBUMIN  3.8 11/17/2017 1355   AST 25 12/07/2024 1232   AST 23 11/17/2017 1355   ALT 19 12/07/2024 1232   ALT 22 11/17/2017 1355   ALKPHOS 66 12/07/2024 1232   ALKPHOS 55 11/17/2017 1355   BILITOT 0.7 12/07/2024 1232   BILITOT 0.49 11/17/2017 1355   GFRNONAA >60 12/11/2024 0737   GFRNONAA 57 (L) 12/07/2024 1232   GFRNONAA 53 (L) 05/09/2016 1104   GFRAA 53 (L) 06/12/2020 1146   GFRAA 55 (L) 12/13/2019 1200   GFRAA 61 05/09/2016 1104    Lab Results  Component Value Date   TOTALPROTELP 6.0 03/04/2024   ALBUMINELP 3.4 03/04/2024   A1GS 0.4 03/04/2024   A2GS 0.6 03/04/2024   BETS 1.1 03/04/2024   GAMS 0.6 03/04/2024   MSPIKE Not Observed 03/04/2024   SPEI Comment 03/04/2024    Lab Results  Component Value Date   WBC 4.6 01/11/2025   NEUTROABS PENDING 01/11/2025   HGB 7.2 (L) 01/11/2025   HCT 22.6 (L) 01/11/2025   MCV 127.0 (H) 01/11/2025   PLT 144 (L) 01/11/2025    Lab Results  Component Value Date   LABCA2 18 08/21/2012    No components found for: [id number]  No results for input(s): INR in the last 168 hours.    Lab Results  Component Value Date   LABCA2 18 08/21/2012    No results found for: CAN199  No results found for: CAN125  No results found for: RJW846  Lab Results  Component Value Date   CA2729 26.9 12/13/2019    No components found for: HGQUANT  No results found for: CEA1, CEA / No results found for: CEA1, CEA  No results found for: AFPTUMOR  No results found for: CHROMOGRNA  No  results found for: KPAFRELGTCHN, LAMBDASER, KAPLAMBRATIO (kappa/lambda light chains)  No results found for: HGBA, HGBA2QUANT, HGBFQUANT, HGBSQUAN (Hemoglobinopathy evaluation)   Lab Results  Component Value Date   LDH 242 (H) 11/03/2024    Lab Results  Component Value Date   IRON  55 12/14/2024   TIBC 372 12/14/2024   IRONPCTSAT 15 12/14/2024   (Iron  and TIBC)  Lab Results  Component Value Date   FERRITIN 81 12/14/2024    Urinalysis    Component Value Date/Time   COLORURINE YELLOW 06/28/2022 1122   APPEARANCEUR CLEAR 06/28/2022 1122   LABSPEC 1.015 06/28/2022 1122   LABSPEC 1.010 04/29/2016 1118   PHURINE 6.0 06/28/2022 1122   GLUCOSEU NEGATIVE 06/28/2022 1122   GLUCOSEU Negative 04/29/2016 1118   HGBUR NEGATIVE 06/28/2022 1122   BILIRUBINUR NEGATIVE 06/28/2022 1122   BILIRUBINUR Negative 04/29/2016 1118   KETONESUR NEGATIVE 06/28/2022 1122   PROTEINUR NEGATIVE 06/28/2022 1122   UROBILINOGEN 0.2 04/29/2016 1118   NITRITE NEGATIVE 06/28/2022 1122   LEUKOCYTESUR NEGATIVE 06/28/2022 1122   LEUKOCYTESUR Trace 04/29/2016 1118   CBC from today shows significant improvement in hemoglobin.  White blood cell count is 3100, total neutrophil count is 2500, no thrombocytopenia  STUDIES: No results found.     COMPARISON: Abdominal ultrasound November 05, 2021   TECHNIQUE:  Real time limited abdomen ultrasound was performed.   IMAGE QUALITY: Satisfactory, of diagnostic value.   FINDINGS:   Limited sonographic views of the abdomen demonstrates mild abdominal ascites. There is no large pocket to safely drain.    IMPRESSION:  IMPRESSION:  1. Mild abdominal ascites.  2. No large enough pocket to safely drain.    ASSESSMENT: 69 y.o. [place] woman presenting June 2011 with stage IV breast cancer involving the right breast upper outer quadrant, right axilla, mediastinal lymph nodes, and both lungs, but not the brain, liver, or bones  (1) positive right  breast and right axillary lymph node biopsies 07/17/2010 of a clinical T4 N2 M1 invasive ductal carcinoma, grade 1, estrogen receptor 100% positive, progesterone receptor 6% positive, with an MIB-1 of 12%, and no HER-2 amplification.  (2) participated in Phase II tessetaxel study, receiving 2 cycles complicated by thrombocytopenia, anemia requiring transfusion, transaminase elevation, and afebrile neutropenia. Off-study as of September 2011. Chest CT scan September 2011 did show evidence of response.  (3) on letrozole  as of October 2011, with continuing response and good tolerance   (4) 06/02/2012 underwent right mastectomy and axillary lymph node sampling (3 lymph nodes removed, all with viable cancer as well as evidence of treatment effect) for a ypT2 ypN1-2 invasive ductal carcinoma, grade 1, 98% estrogen receptor positive, progesterone receptor negative, with no HER-2 amplification  (5) left jugular vein DVT documented 06/16/2012; left sided Port-A-Cath removed mid July 2015; Coumadin  stopped 09/07/2014.  (a) Left brachiocephalic v. slightly narrowed, with some Left lower neck collateralization noted on chest CT December 2015  (6) osteoporosis, was on alendronate , switched to denosumab /Prolia  11/07/2016  (a) bone density in 10/2018 shows osteopenia T score of -2.2  (7) portal hypertension, with splenomegaly, gastric varices, and intestinal edema  (a)  mild persistent leukopenia and thrombocytopenia likely due to splenomegaly  (b) iron  deficiency secondary to above, status post Feraheme  x2 December 2018  (8) restaging studies:  (a) chest CT 12/26/2017 shows a 4.5 mm left lower lobe pulmonary nodule which is unchanged.  There was no other evidence of active disease  (b)  restaging chest CT in 05/2019 shows no change in bilateral pulmonary nodules, prominent mediastinal lymph nodes are stable  (9) CT scan of the abdomen 11/03/2021 shows thrombus in the portal, splenic and superior mesenteric  vein  (A) Heparin  started 11/03/2021  10. PET scan 05/24/2022, low metabolic activity of right lower pulmonary nodule. No change in size of CT 02/27/2022, Carcinoma with mucinous features consistent with metastatic breast carcinoma, negative margins, no LN involvement.  Prognostics showed ER +100% strong staining, PR +20% staining, Ki-67 of 10% and HER2 2+ by IHC and negative by FISH  PLAN:  Assessment & Plan  Metastatic breast cancer with no active disease. - Continue Ibrance , Faslodex , and Prolia . Prolia  on hold today, hypocalcemia noted. No concern for active disease Repeat imaging in March 2026.  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 Likely multifactorial etiology: cirrhosis, metastatic breast cancer, or Ibrance -induced cytopenia. No overt gastrointestinal bleeding. Differential includes bone marrow suppression, anemia of chronic disease, or occult bleeding. Transfusion-dependent with worsening symptoms. - Arranged blood transfusion 1/16 per pt's preference - Held Ibrance  for one month to assess for medication-induced cytopenia. - Planned bone marrow biopsy within the next month to rule out met breast cancer - Coordinate with laboratory and blood bank for transfusion compatibility. - Provided instructions to report worsening symptoms. - Discussed possibility of establishing a recurring transfusion program and regular laboratory monitoring.  Cirrhosis of liver Cirrhosis likely contributes to anemia and overall clinical status. Not a transplant candidate due to metastatic breast cancer. Management focuses on medical optimization and symptom control. - Follow up on hepatology referral and will ensure appointment is scheduled. - Refilled carvedilol . - Instructed her to keep primary care provider informed and attend scheduled appointments.  Total time: 40 min  *Total Encounter Time as defined  by the Centers for Medicare and Medicaid Services includes, in addition to the face-to-face time of a patient visit (documented in the note above) non-face-to-face time: obtaining and reviewing outside history, ordering and reviewing medications, tests or procedures, care coordination (communications with other health care professionals or caregivers) and documentation in the medical record. "

## 2025-01-12 ENCOUNTER — Other Ambulatory Visit: Payer: Self-pay

## 2025-01-12 ENCOUNTER — Telehealth: Payer: Self-pay | Admitting: Hematology and Oncology

## 2025-01-12 DIAGNOSIS — C50411 Malignant neoplasm of upper-outer quadrant of right female breast: Secondary | ICD-10-CM

## 2025-01-12 NOTE — Telephone Encounter (Signed)
 I attempted to reach patient but voicemail box is full. Per 01/11/2025 los, patient is to return for MD visit in 4 weeks. Patient had lab and injection on 02/14/2025 but due to MD being oof for Admin, I rescheduled all appointment for 02/15/2025.

## 2025-01-13 ENCOUNTER — Other Ambulatory Visit: Payer: Self-pay

## 2025-01-13 LAB — PREPARE RBC (CROSSMATCH)

## 2025-01-14 ENCOUNTER — Inpatient Hospital Stay

## 2025-01-14 VITALS — BP 125/69 | HR 80 | Temp 97.6°F | Resp 16

## 2025-01-14 DIAGNOSIS — M810 Age-related osteoporosis without current pathological fracture: Secondary | ICD-10-CM

## 2025-01-14 DIAGNOSIS — Z5111 Encounter for antineoplastic chemotherapy: Secondary | ICD-10-CM | POA: Diagnosis not present

## 2025-01-14 DIAGNOSIS — C50411 Malignant neoplasm of upper-outer quadrant of right female breast: Secondary | ICD-10-CM

## 2025-01-14 MED ORDER — SODIUM CHLORIDE 0.9% IV SOLUTION
250.0000 mL | INTRAVENOUS | Status: DC
  Administered 2025-01-14: 100 mL via INTRAVENOUS

## 2025-01-14 MED ORDER — ACETAMINOPHEN 325 MG PO TABS
650.0000 mg | ORAL_TABLET | Freq: Once | ORAL | Status: AC
  Administered 2025-01-14: 650 mg via ORAL
  Filled 2025-01-14: qty 2

## 2025-01-14 MED ORDER — FULVESTRANT 250 MG/5ML IM SOSY
500.0000 mg | PREFILLED_SYRINGE | Freq: Once | INTRAMUSCULAR | Status: AC
  Administered 2025-01-14: 500 mg via INTRAMUSCULAR
  Filled 2025-01-14: qty 10

## 2025-01-14 MED ORDER — DIPHENHYDRAMINE HCL 25 MG PO CAPS
25.0000 mg | ORAL_CAPSULE | Freq: Once | ORAL | Status: AC
  Administered 2025-01-14: 25 mg via ORAL
  Filled 2025-01-14: qty 1

## 2025-01-14 MED ORDER — TRAMADOL HCL 50 MG PO TABS
50.0000 mg | ORAL_TABLET | Freq: Once | ORAL | Status: AC
  Administered 2025-01-14: 50 mg via ORAL
  Filled 2025-01-14: qty 1

## 2025-01-14 NOTE — Patient Instructions (Addendum)
 Blood Transfusion, Adult A blood transfusion is a procedure in which you receive blood through an IV tube. You may need this procedure because of: A bleeding disorder. An illness. An injury. A surgery. The blood may come from someone else (a donor). You may also be able to donate blood for yourself before a surgery. The blood given in a transfusion may be made up of different types of cells. You may get: Red blood cells. These carry oxygen to the cells in the body. Platelets. These help your blood to clot. Plasma. This is the liquid part of your blood. It carries proteins and other substances through the body. White blood cells. These help you fight infections. If you have a clotting disorder, you may also get other types of blood products. Depending on the type of blood product, this procedure may take 1-4 hours to complete. Tell your doctor about: Any bleeding problems you have. Any reactions you have had during a blood transfusion in the past. Any allergies you have. All medicines you are taking, including vitamins, herbs, eye drops, creams, and over-the-counter medicines. Any surgeries you have had. Any medical conditions you have. Whether you are pregnant or may be pregnant. What are the risks? Talk with your health care provider about risks. The most common problems include: A mild allergic reaction. This includes red, swollen areas of skin (hives) and itching. Fever or chills. This may be the body's response to new blood cells received. This may happen during or up to 4 hours after the transfusion. More serious problems may include: A serious allergic reaction. This includes breathing trouble or swelling around the face and lips. Too much fluid in the lungs. This may cause breathing problems. Lung injury. This causes breathing trouble and low oxygen in the blood. This can happen within hours of the transfusion or days later. Too much iron . This can happen after getting many blood  transfusions over a period of time. An infection or virus passed through the blood. This is rare. Donated blood is carefully tested before it is given. Your body's defense system (immune system) trying to attack the new blood cells. This is rare. Symptoms may include fever, chills, nausea, low blood pressure, and low back or chest pain. Donated cells attacking healthy tissues. This is rare. What happens before the procedure? You will have a blood test to find out your blood type. The test also finds out what type of blood your body will accept and matches it to the donor type. If you are going to have a planned surgery, you may be able to donate your own blood. This may be done in case you need a transfusion. You will have your temperature, blood pressure, and pulse checked. You may receive medicine to help prevent an allergic reaction. This may be done if you have had a reaction to a transfusion before. This medicine may be given to you by mouth or through an IV tube. What happens during the procedure?  An IV tube will be put into one of your veins. The bag of blood will be attached to your IV tube. Then, the blood will enter through your vein. Your temperature, blood pressure, and pulse will be checked often. This is done to find early signs of a transfusion reaction. Tell your nurse right away if you have any of these symptoms: Shortness of breath or trouble breathing. Chest or back pain. Fever or chills. Red, swollen areas of skin or itching. If you have any signs  or symptoms of a reaction, your transfusion will be stopped. You may also be given medicine. When the transfusion is finished, your IV tube will be taken out. Pressure may be put on the IV site for a few minutes. A bandage (dressing) will be put on the IV site. The procedure may vary among doctors and hospitals. What happens after the procedure? You will be monitored until you leave the hospital or clinic. This includes  checking your temperature, blood pressure, pulse, breathing rate, and blood oxygen level. Your blood may be tested to see how you have responded to the transfusion. You may be warmed with fluids or blankets. This is done to keep the temperature of your body normal. If you have your procedure in an outpatient setting, you will be told whom to contact to report any reactions. Where to find more information Visit the [employer]: redcross.org Summary A blood transfusion is a procedure in which you receive blood through an IV tube. The blood you are given may be made up of different blood cells. You may receive red blood cells, platelets, plasma, or white blood cells. Your temperature, blood pressure, and pulse will be checked often. After the procedure, your blood may be tested to see how you have responded. This information is not intended to replace advice given to you by your health care provider. Make sure you discuss any questions you have with your health care provider. Document Revised: 03/15/2022 Document Reviewed: 03/15/2022 Elsevier Patient Education  2024 [employer]  Fulvestrant  Injection What is this medication? FULVESTRANT  (ful VES trant) treats breast cancer. It works by blocking the hormone estrogen in breast tissue, which prevents breast cancer cells from spreading or growing. This medicine may be used for other purposes; ask your health care provider or pharmacist if you have questions. COMMON BRAND NAME(S): FASLODEX  What should I tell my care team before I take this medication? They need to know if you have any of these conditions: Bleeding disorder Liver disease Low blood cell levels (white cells, red cells, and platelets) An unusual or allergic reaction to fulvestrant , other medications, foods, dyes, or preservatives Pregnant or trying to get pregnant Breastfeeding How should I use this medication? This medication is injected into a muscle. It is given by your  care team in a hospital or clinic setting. Talk to your care team about the use of this medication in children. Special care may be needed. Overdosage: If you think you have taken too much of this medicine contact a poison control center or emergency room at once. NOTE: This medicine is only for you. Do not share this medicine with others. What if I miss a dose? Keep appointments for follow-up doses. It is important not to miss your dose. Call your care team if you are unable to keep an appointment. What may interact with this medication? Fluoroestradiol F18 This list may not describe all possible interactions. Give your health care provider a list of all the medicines, herbs, non-prescription drugs, or dietary supplements you use. Also tell them if you smoke, drink alcohol, or use illegal drugs. Some items may interact with your medicine. What should I watch for while using this medication? Your condition will be monitored carefully while you are receiving this medication. You may need blood work done while you are taking this medication. This medication is injected into a muscle. Talk to your care team if you also take medications that prevent or treat blood clots, such as warfarin. Blood thinners  may increase the risk of bleeding or bruising in the muscle where this medication is injected. The benefits of this medication may outweigh the risks. Your care team can help you find the option that works for you. They can also help limit the risk of bleeding. Talk to your care team if you may be pregnant. Serious birth defects can occur if you take this medication during pregnancy and for 1 year after the last dose. You will need a negative pregnancy test before starting this medication. Contraception is recommended while taking this medication and for 1 year after the last dose. Your care team can help you find the option that works for you. Do not breastfeed while taking this medication and for 1 year  after the last dose. This medication may cause infertility. Talk to your care team if you are concerned about your fertility. What side effects may I notice from receiving this medication? Side effects that you should report to your care team as soon as possible: Allergic reactions or angioedema--skin rash, itching or hives, swelling of the face, eyes, lips, tongue, arms, or legs, trouble swallowing or breathing Pain, tingling, or numbness in the hands or feet Side effects that usually do not require medical attention (report to your care team if they continue or are bothersome): Bone, joint, or muscle pain Constipation Headache Hot flashes Nausea Pain, redness, or irritation at injection site Unusual weakness or fatigue This list may not describe all possible side effects. Call your doctor for medical advice about side effects. You may report side effects to FDA at 1-800-FDA-1088. Where should I keep my medication? This medication is given in a hospital or clinic. It will not be stored at home. NOTE: This sheet is a summary. It may not cover all possible information. If you have questions about this medicine, talk to your doctor, pharmacist, or health care provider.  2024 Elsevier/[employer] Standard (2023-08-22 00:00:00)

## 2025-01-17 LAB — TYPE AND SCREEN
ABO/RH(D): O POS
Antibody Screen: POSITIVE
Unit division: 0
Unit division: 0

## 2025-01-17 LAB — BPAM RBC
Blood Product Expiration Date: 202602032359
Blood Product Expiration Date: 202602112359
ISSUE DATE / TIME: 202601161301
ISSUE DATE / TIME: 202601161518
Unit Type and Rh: 5100
Unit Type and Rh: 5100

## 2025-01-31 ENCOUNTER — Other Ambulatory Visit: Payer: Self-pay | Admitting: Radiology

## 2025-01-31 DIAGNOSIS — D649 Anemia, unspecified: Secondary | ICD-10-CM

## 2025-01-31 NOTE — H&P (Incomplete)
 "   Chief Complaint: Symptomatic anemia secondary to ? cirrhosis and/or metastatic breast cancer ; referred for image guided bone marrow biopsy for further evaluation  Referring Provider(s): Iruku,P  Supervising Physician: Philip Cornet  Patient Status: [facility] - Out-pt  History of Present Illness: Nicole [name] is a 69 y.o. female with PMH sig for arthritis, depression, nephrolithiasis, hypertension, IBD, cirrhosis, portal hypertension, peripheral vascular disease, metastatic breast cancer, left internal jugular DVT 2013, gastric/esophageal varices, chronic portal vein thrombosis who presents now with unexplained severe anemia.  She is scheduled today for image guided bone marrow biopsy for further evaluation.  ,  *** Patient is Full Code  Past Medical History:  Diagnosis Date   Anemia    low iron    Arthritis    fingers   Blood transfusion 2012   Breast CA (HCC)    breast ca dx 06/2010, stage 4, right , Lung Metasis.Surgery, Chemo, Radiation   Breast cancer (HCC)    Clot    jugular  ?2015   Depression    Esophageal varices (HCC)    Headache    Mirgraine- rare since menopause   Heart murmur    mild, no cardiologist, from birth   Hematemesis 08/2017   History of kidney stones    no pain- shows in CT- small 1 mm bilaterally- kidney   Hypertension    IBD (inflammatory bowel disease)    resolved   Idiopathic cirrhosis (HCC)    pt denies   Peripheral vascular disease few yrs ago   clot in right jugular   Portal hypertension (HCC)    Radiation 07/21/12-09/07/12   5940 cGy   Restless legs     Past Surgical History:  Procedure Laterality Date   BIOPSY  08/21/2020   Procedure: BIOPSY;  Surgeon: Saintclair Jasper, MD;  Location: [facility];  Service: Gastroenterology;;   COLONOSCOPY  2016   DILATION AND CURETTAGE OF UTERUS  2004?   ESOPHAGEAL BANDING  09/04/2017   Procedure: ESOPHAGEAL BANDING;  Surgeon: Saintclair Jasper, MD;  Location: [facility] ENDOSCOPY;  Service: Gastroenterology;;    ESOPHAGEAL BANDING  11/27/2017   Procedure: ESOPHAGEAL BANDING;  Surgeon: Saintclair Jasper, MD;  Location: [facility] ENDOSCOPY;  Service: Gastroenterology;;   ESOPHAGEAL BANDING N/A 04/28/2018   Procedure: ESOPHAGEAL BANDING;  Surgeon: Saintclair Jasper, MD;  Location: [facility];  Service: Gastroenterology;  Laterality: N/A;   ESOPHAGEAL BANDING  11/17/2018   Procedure: ESOPHAGEAL BANDING;  Surgeon: Saintclair Jasper, MD;  Location: [facility];  Service: Gastroenterology;;   ESOPHAGEAL BANDING  08/16/2019   Procedure: ESOPHAGEAL BANDING;  Surgeon: Saintclair Jasper, MD;  Location: [facility];  Service: Gastroenterology;;   ESOPHAGEAL BANDING N/A 08/21/2020   Procedure: ESOPHAGEAL BANDING;  Surgeon: Saintclair Jasper, MD;  Location: [facility];  Service: Gastroenterology;  Laterality: N/A;   ESOPHAGEAL BANDING  10/21/2023   Procedure: ESOPHAGEAL BANDING;  Surgeon: Saintclair Jasper, MD;  Location: [facility];  Service: Gastroenterology;;   ESOPHAGOGASTRODUODENOSCOPY N/A 04/28/2018   Procedure: ESOPHAGOGASTRODUODENOSCOPY (EGD);  Surgeon: Saintclair Jasper, MD;  Location: [facility];  Service: Gastroenterology;  Laterality: N/A;   ESOPHAGOGASTRODUODENOSCOPY N/A 11/17/2018   Procedure: ESOPHAGOGASTRODUODENOSCOPY (EGD);  Surgeon: Saintclair Jasper, MD;  Location: [facility];  Service: Gastroenterology;  Laterality: N/A;   ESOPHAGOGASTRODUODENOSCOPY N/A 08/13/2022   Procedure: ESOPHAGOGASTRODUODENOSCOPY (EGD);  Surgeon: Saintclair Jasper, MD;  Location: [facility];  Service: Gastroenterology;  Laterality: N/A;   ESOPHAGOGASTRODUODENOSCOPY N/A 12/09/2024   Procedure: EGD (ESOPHAGOGASTRODUODENOSCOPY);  Surgeon: Elicia Claw, MD;  Location: [facility];  Service: Gastroenterology;  Laterality: N/A;  ESOPHAGOGASTRODUODENOSCOPY (EGD) WITH PROPOFOL  N/A 04/11/2016   Procedure: ESOPHAGOGASTRODUODENOSCOPY (EGD) WITH PROPOFOL ;  Surgeon: Oliva Boots, MD;  Location: [facility];  Service: Endoscopy;  Laterality: N/A;   ESOPHAGOGASTRODUODENOSCOPY  (EGD) WITH PROPOFOL  N/A 07/08/2016   Procedure: ESOPHAGOGASTRODUODENOSCOPY (EGD) WITH PROPOFOL ;  Surgeon: Lynwood Bohr, MD;  Location: [facility] ENDOSCOPY;  Service: Endoscopy;  Laterality: N/A;   ESOPHAGOGASTRODUODENOSCOPY (EGD) WITH PROPOFOL  N/A 03/03/2017   Procedure: ESOPHAGOGASTRODUODENOSCOPY (EGD) WITH PROPOFOL ;  Surgeon: Lynwood Bohr, MD;  Location: [facility] ENDOSCOPY;  Service: Endoscopy;  Laterality: N/A;   ESOPHAGOGASTRODUODENOSCOPY (EGD) WITH PROPOFOL  N/A 09/04/2017   Procedure: ESOPHAGOGASTRODUODENOSCOPY (EGD) WITH PROPOFOL ;  Surgeon: Saintclair Jasper, MD;  Location: [facility] ENDOSCOPY;  Service: Gastroenterology;  Laterality: N/A;   ESOPHAGOGASTRODUODENOSCOPY (EGD) WITH PROPOFOL  N/A 11/27/2017   Procedure: ESOPHAGOGASTRODUODENOSCOPY (EGD);  Surgeon: Saintclair Jasper, MD;  Location: [facility] ENDOSCOPY;  Service: Gastroenterology;  Laterality: N/A;   ESOPHAGOGASTRODUODENOSCOPY (EGD) WITH PROPOFOL  N/A 08/16/2019   Procedure: ESOPHAGOGASTRODUODENOSCOPY (EGD) WITH PROPOFOL ;  Surgeon: Saintclair Jasper, MD;  Location: [facility];  Service: Gastroenterology;  Laterality: N/A;   ESOPHAGOGASTRODUODENOSCOPY (EGD) WITH PROPOFOL  N/A 08/21/2020   Procedure: ESOPHAGOGASTRODUODENOSCOPY (EGD) WITH PROPOFOL ;  Surgeon: Saintclair Jasper, MD;  Location: [facility];  Service: Gastroenterology;  Laterality: N/A;   ESOPHAGOGASTRODUODENOSCOPY (EGD) WITH PROPOFOL  N/A 08/24/2021   Procedure: ESOPHAGOGASTRODUODENOSCOPY (EGD) WITH PROPOFOL ;  Surgeon: Saintclair Jasper, MD;  Location: [facility];  Service: Gastroenterology;  Laterality: N/A;   ESOPHAGOGASTRODUODENOSCOPY (EGD) WITH PROPOFOL  N/A 06/25/2023   Procedure: ESOPHAGOGASTRODUODENOSCOPY (EGD) WITH PROPOFOL ;  Surgeon: Boots Oliva, MD;  Location: [facility];  Service: Gastroenterology;  Laterality: N/A;  W. bandging   ESOPHAGOGASTRODUODENOSCOPY (EGD) WITH PROPOFOL  N/A 10/21/2023   Procedure: ESOPHAGOGASTRODUODENOSCOPY (EGD) WITH PROPOFOL ;  Surgeon: Saintclair Jasper, MD;  Location: [facility];  Service:  Gastroenterology;  Laterality: N/A;  with banding and APC   GASTRIC VARICES BANDING N/A 08/24/2021   Procedure: GASTRIC VARICES BANDING;  Surgeon: Saintclair Jasper, MD;  Location: [facility];  Service: Gastroenterology;  Laterality: N/A;   GASTRIC VARICES BANDING N/A 08/13/2022   Procedure: GASTRIC VARICES BANDING;  Surgeon: Saintclair Jasper, MD;  Location: [facility];  Service: Gastroenterology;  Laterality: N/A;   HEMOSTASIS CLIP PLACEMENT  08/21/2020   Procedure: HEMOSTASIS CLIP PLACEMENT;  Surgeon: Saintclair Jasper, MD;  Location: [facility];  Service: Gastroenterology;;   HOT HEMOSTASIS N/A 10/21/2023   Procedure: HOT HEMOSTASIS (ARGON PLASMA COAGULATION/BICAP);  Surgeon: Saintclair Jasper, MD;  Location: [facility];  Service: Gastroenterology;  Laterality: N/A;   INTERCOSTAL NERVE BLOCK Left 07/01/2022   Procedure: INTERCOSTAL NERVE BLOCK;  Surgeon: Kerrin Elspeth BROCKS, MD;  Location: [facility] OR;  Service: Thoracic;  Laterality: Left;   IR GENERIC HISTORICAL  05/15/2016   IR RADIOLOGIST EVAL & MGMT 05/15/2016 Ami Bellman, DO [facility] INTERV RAD   IR GENERIC HISTORICAL  01/14/2017   IR US  GUIDE VASC ACCESS RIGHT 01/14/2017 Ozell Specking, MD [facility]   IR GENERIC HISTORICAL  01/14/2017   IR VENOGRAM HEPATIC W HEMODYNAMIC EVALUATION 01/14/2017 Ozell Specking, MD [facility]   IR RADIOLOGIST EVAL & MGMT  06/12/2017   IR RADIOLOGIST EVAL & MGMT  08/11/2018   IR RADIOLOGIST EVAL & MGMT  08/25/2019   IR RADIOLOGIST EVAL & MGMT  08/22/2020   IR RADIOLOGIST EVAL & MGMT  03/11/2022   MASTECTOMY Right 06/02/2012   right breast with lymph node removal   NODE DISSECTION Left 07/01/2022   Procedure: NODE DISSECTION;  Surgeon: Kerrin Elspeth BROCKS, MD;  Location: [facility] OR;  Service: Thoracic;  Laterality: Left;   PORT-A-CATH REMOVAL  PORTACATH PLACEMENT  2011   left side   RADIOFREQUENCY ABLATION  06/25/2023   Procedure: RADIO FREQUENCY ABLATION;  Surgeon: Rosalie Kitchens, MD;  Location: [facility];  Service:  Gastroenterology;;   RADIOLOGY WITH ANESTHESIA N/A 04/13/2016   Procedure: RADIOLOGY WITH ANESTHESIA;  Surgeon: Ami Bellman, DO;  Location: [facility] OR;  Service: Anesthesiology;  Laterality: N/A;   VIDEO BRONCHOSCOPY WITH ENDOBRONCHIAL NAVIGATION N/A 07/01/2022   Procedure: VIDEO BRONCHOSCOPY WITH ENDOBRONCHIAL NAVIGATION;  Surgeon: Kerrin Elspeth BROCKS, MD;  Location: [facility] OR;  Service: Thoracic;  Laterality: N/A;   XI ROBOTIC ASSISTED THORACOSCOPY- SEGMENTECTOMY Left 07/01/2022   Procedure: XI ROBOTIC ASSISTED THORACOSCOPY-LEFT LOWER LOBE BASILAR SEGMENTECTOMY;  Surgeon: Kerrin Elspeth BROCKS, MD;  Location: [facility] OR;  Service: Thoracic;  Laterality: Left;    Allergies: Patient has no known allergies.  Medications: Prior to Admission medications  Medication Sig Start Date End Date Taking? Authorizing Provider  apixaban  (ELIQUIS ) 2.5 MG TABS tablet Take 1 tablet (2.5 mg total) by mouth 2 (two) times daily. 12/14/24   Iruku, Praveena, MD  carvedilol  (COREG ) 3.125 MG tablet Take 1 tablet (3.125 mg total) by mouth 2 (two) times daily with a meal. 01/11/25   Loretha Ash, MD  cyanocobalamin  (VITAMIN B12) 1000 MCG tablet Take 1,000 mcg by mouth daily after breakfast. Patient not taking: Reported on 01/14/2025    [provider]  denosumab  (PROLIA ) 60 MG/ML SOLN injection Inject 60 mg into the skin every 6 (six) months. Administer in upper arm, thigh, or abdomen    [provider]  ferrous sulfate  325 (65 FE) MG tablet Take 1 tablet (325 mg total) by mouth daily before breakfast. Patient taking differently: Take 325 mg by mouth daily after breakfast. 03/06/24   Shalhoub, Zachary PARAS, MD  fulvestrant  (FASLODEX ) 250 MG/5ML injection Inject 500 mg into the muscle every 28 (twenty-eight) days.    [provider]  furosemide  (LASIX ) 40 MG tablet Take 40 mg by mouth in the morning. Patient not taking: Reported on 01/14/2025    [provider]  Lactulose  20 GM/30ML SOLN Take 30  mLs (20 g total) by mouth 2 (two) times daily. 11/30/24   Iruku, Praveena, MD  Milk Thistle 1000 MG CAPS Take 1,000 mg by mouth daily. 11/14/21   [provider]  palbociclib  (IBRANCE ) 75 MG tablet Take 1 tablet (75 mg total) by mouth daily. Take for 21 days on, 7 days off, repeat every 28 days. 11/11/24   Iruku, Praveena, MD  pantoprazole  (PROTONIX ) 40 MG tablet Take 1 tablet (40 mg total) by mouth 2 (two) times daily before a meal. 12/11/24   Tobie Yetta HERO, MD  spironolactone  (ALDACTONE ) 100 MG tablet Take 100 mg by mouth in the morning.    [provider]  venlafaxine  XR (EFFEXOR -XR) 150 MG 24 hr capsule TAKE 1 CAPSULE BY MOUTH DAILY WITH BREAKFAST. 08/23/24   Iruku, Praveena, MD     Family History  Problem Relation Age of Onset   COPD Mother    AAA (abdominal aortic aneurysm) Mother    COPD Father    Cancer Neg Hx     Social History   Socioeconomic History   Marital status: Married    Spouse name: Not on file   Number of children: Not on file   Years of education: Not on file   Highest education level: Not on file  Occupational History   Not on file  Tobacco Use   Smoking status: Never   Smokeless tobacco: Never  Vaping  Use   Vaping status: Never Used  Substance and Sexual Activity   Alcohol use: No   Drug use: No   Sexual activity: Yes  Other Topics Concern   Not on file  Social History Narrative   Not on file   Social Drivers of Health   Tobacco Use: Low Risk (12/09/2024)   Patient History    Smoking Tobacco Use: Never    Smokeless Tobacco Use: Never    Passive Exposure: Not on file  Financial Resource Strain: Not on file  Food Insecurity: No Food Insecurity (12/07/2024)   Epic    Worried About Programme Researcher, Broadcasting/film/video in the Last Year: Never true    Ran Out of Food in the Last Year: Never true  Transportation Needs: No Transportation Needs (12/07/2024)   Epic    Lack of Transportation (Medical): No    Lack of Transportation (Non-Medical): No   Physical Activity: Not on file  Stress: Not on file  Social Connections: Socially Isolated (12/07/2024)   Social Connection and Isolation Panel    Frequency of Communication with Friends and Family: Twice a week    Frequency of Social Gatherings with Friends and Family: Never    Attends Religious Services: Never    Database Administrator or Organizations: No    Attends Banker Meetings: Never    Marital Status: Married  Depression (PHQ2-9): High Risk (09/20/2024)   Depression (PHQ2-9)    PHQ-2 Score: 15  Alcohol Screen: Not on file  Housing: Low Risk (12/07/2024)   Epic    Unable to Pay for Housing in the Last Year: No    Number of Times Moved in the Last Year: 0    Homeless in the Last Year: No  Utilities: Not At Risk (12/07/2024)   Epic    Threatened with loss of utilities: No  Health Literacy: Not on file       Review of Systems  Vital Signs:   Advance Care Plan: No documents on file   Physical Exam  Imaging: No results found.  Labs:  CBC: Recent Labs    12/10/24 1654 12/11/24 0737 12/14/24 0958 01/11/25 0956  WBC 4.0 3.0* 6.3 4.6  HGB 9.4* 9.8* 11.4* 7.2*  HCT 31.5* 30.6* 35.1* 22.6*  PLT 168 128* 226 144*    COAGS: Recent Labs    03/04/24 1412 11/17/24 1008 12/07/24 1614  INR 1.7* 1.4* 2.6*  APTT 29  --  49*    BMP: Recent Labs    12/09/24 0627 12/10/24 0556 12/11/24 0737 01/11/25 0956  NA 137 138 140 135  K 3.5 3.5 3.3* 4.9  CL 105 106 109 102  CO2 21* 19* 21* 17*  GLUCOSE 115* 108* 99 119*  BUN 11 10 9 18   CALCIUM 7.5* 7.3* 6.9* 9.2  CREATININE 1.22* 0.96 0.92 1.33*  GFRNONAA 48* >60 >60 43*    LIVER FUNCTION TESTS: Recent Labs    11/03/24 1406 11/17/24 1009 12/07/24 1232 01/11/25 0956  BILITOT 0.8 0.7 0.7 0.9  AST 16 21 25 22   ALT 12 12 19 15   ALKPHOS 54 66 66 61  PROT 6.5 6.5 6.2* 6.7  ALBUMIN  3.9 4.0 3.5 4.1    TUMOR MARKERS: No results for input(s): AFPTM, CEA, CA199, CHROMGRNA in the  last 8760 hours.  Assessment and Plan: 69 y.o. female with PMH sig for arthritis, depression, nephrolithiasis, hypertension, IBD, cirrhosis, portal hypertension, peripheral vascular disease, metastatic breast cancer, left internal jugular DVT 2013, gastric/esophageal varices,  chronic portal vein thrombosis who presents now with unexplained severe anemia.  She is scheduled today for image guided bone marrow biopsy for further evaluation.Risks and benefits of procedure was discussed with the patient including, but not limited to bleeding, infection, damage to adjacent structures or low yield requiring additional tests.  All of the questions were answered and there is agreement to proceed.  Consent signed and in chart. Patient known to IR team from [facility] in April 2017, random liver biopsy 2017 and multiple paracenteses.   Thank you for allowing our service to participate in Nicole [name] 's care.  Electronically Signed: D. Franky Rakers, PA-C   01/31/2025, 12:31 PM      I spent a total of  20 minutes   in face to face in clinical consultation, greater than 50% of which was counseling/coordinating care for image guided bone marrow biopsy  "

## 2025-02-01 ENCOUNTER — Ambulatory Visit (HOSPITAL_COMMUNITY): Admission: RE | Admit: 2025-02-01 | Source: Ambulatory Visit

## 2025-02-01 ENCOUNTER — Ambulatory Visit (HOSPITAL_COMMUNITY)

## 2025-02-04 ENCOUNTER — Other Ambulatory Visit (HOSPITAL_COMMUNITY): Payer: Self-pay

## 2025-02-14 ENCOUNTER — Inpatient Hospital Stay

## 2025-02-15 ENCOUNTER — Inpatient Hospital Stay

## 2025-02-15 ENCOUNTER — Inpatient Hospital Stay: Admitting: Hematology and Oncology

## 2025-02-25 ENCOUNTER — Ambulatory Visit (HOSPITAL_COMMUNITY)

## 2025-03-14 ENCOUNTER — Inpatient Hospital Stay

## 2025-03-22 ENCOUNTER — Ambulatory Visit (HOSPITAL_COMMUNITY): Admit: 2025-03-22 | Admitting: Gastroenterology

## 2025-03-22 ENCOUNTER — Encounter (HOSPITAL_COMMUNITY): Payer: Self-pay

## 2025-03-22 SURGERY — EGD (ESOPHAGOGASTRODUODENOSCOPY)
Anesthesia: Monitor Anesthesia Care

## 2025-04-14 ENCOUNTER — Inpatient Hospital Stay
